# Patient Record
Sex: Male | Born: 1950 | Race: Black or African American | Hispanic: No | Smoking: Never smoker
Health system: Southern US, Community
[De-identification: ages and names within clinical notes are randomized; demographics above are authoritative.]

## PROBLEM LIST (undated history)

## (undated) DIAGNOSIS — I5082 Biventricular heart failure: Secondary | ICD-10-CM

## (undated) DIAGNOSIS — F1990 Other psychoactive substance use, unspecified, uncomplicated: Secondary | ICD-10-CM

## (undated) DIAGNOSIS — F209 Schizophrenia, unspecified: Secondary | ICD-10-CM

## (undated) DIAGNOSIS — I639 Cerebral infarction, unspecified: Secondary | ICD-10-CM

## (undated) HISTORY — PX: APPENDECTOMY: SHX54

---

## 2005-02-24 ENCOUNTER — Emergency Department: Payer: Self-pay | Admitting: Emergency Medicine

## 2005-03-11 ENCOUNTER — Emergency Department: Payer: Self-pay | Admitting: Emergency Medicine

## 2005-07-24 ENCOUNTER — Inpatient Hospital Stay: Payer: Self-pay | Admitting: Unknown Physician Specialty

## 2005-09-25 ENCOUNTER — Emergency Department: Payer: Self-pay | Admitting: Emergency Medicine

## 2005-10-16 ENCOUNTER — Emergency Department: Payer: Self-pay | Admitting: Emergency Medicine

## 2005-11-14 ENCOUNTER — Emergency Department: Payer: Self-pay | Admitting: Unknown Physician Specialty

## 2007-04-21 ENCOUNTER — Emergency Department: Payer: Self-pay

## 2007-06-14 ENCOUNTER — Emergency Department: Payer: Self-pay | Admitting: Emergency Medicine

## 2007-06-27 ENCOUNTER — Emergency Department: Payer: Self-pay | Admitting: Emergency Medicine

## 2007-06-28 ENCOUNTER — Emergency Department: Payer: Self-pay | Admitting: Emergency Medicine

## 2007-07-14 ENCOUNTER — Emergency Department: Payer: Self-pay | Admitting: Emergency Medicine

## 2007-07-16 ENCOUNTER — Emergency Department: Payer: Self-pay | Admitting: Emergency Medicine

## 2007-07-22 ENCOUNTER — Emergency Department: Payer: Self-pay | Admitting: Emergency Medicine

## 2007-07-30 ENCOUNTER — Emergency Department: Payer: Self-pay | Admitting: Emergency Medicine

## 2007-08-03 ENCOUNTER — Emergency Department: Payer: Self-pay | Admitting: Emergency Medicine

## 2007-08-05 ENCOUNTER — Emergency Department: Payer: Self-pay | Admitting: Emergency Medicine

## 2007-08-06 ENCOUNTER — Emergency Department: Payer: Self-pay | Admitting: Emergency Medicine

## 2007-10-14 ENCOUNTER — Emergency Department: Payer: Self-pay | Admitting: Emergency Medicine

## 2007-10-18 ENCOUNTER — Emergency Department: Payer: Self-pay | Admitting: Emergency Medicine

## 2007-10-25 ENCOUNTER — Emergency Department: Payer: Self-pay | Admitting: Internal Medicine

## 2007-11-14 ENCOUNTER — Emergency Department: Payer: Self-pay | Admitting: Emergency Medicine

## 2007-11-18 ENCOUNTER — Emergency Department: Payer: Self-pay | Admitting: Internal Medicine

## 2007-12-05 ENCOUNTER — Emergency Department: Payer: Self-pay | Admitting: Emergency Medicine

## 2007-12-11 ENCOUNTER — Emergency Department: Payer: Self-pay | Admitting: Emergency Medicine

## 2007-12-25 ENCOUNTER — Emergency Department: Payer: Self-pay | Admitting: Emergency Medicine

## 2007-12-28 ENCOUNTER — Emergency Department: Payer: Self-pay | Admitting: Emergency Medicine

## 2008-01-12 ENCOUNTER — Emergency Department: Payer: Self-pay | Admitting: Emergency Medicine

## 2008-01-14 ENCOUNTER — Emergency Department: Payer: Self-pay | Admitting: Internal Medicine

## 2008-02-16 ENCOUNTER — Emergency Department: Payer: Self-pay | Admitting: Emergency Medicine

## 2008-03-13 ENCOUNTER — Emergency Department: Payer: Self-pay | Admitting: Emergency Medicine
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
## 2008-11-30 ENCOUNTER — Emergency Department: Payer: Self-pay | Admitting: Emergency Medicine

## 2009-01-03 ENCOUNTER — Emergency Department: Payer: Self-pay | Admitting: Emergency Medicine

## 2009-02-14 ENCOUNTER — Emergency Department: Payer: Self-pay | Admitting: Emergency Medicine

## 2009-03-27 ENCOUNTER — Emergency Department: Payer: Self-pay | Admitting: Unknown Physician Specialty

## 2009-04-02 ENCOUNTER — Emergency Department: Payer: Self-pay | Admitting: Unknown Physician Specialty

## 2010-11-27 ENCOUNTER — Emergency Department: Payer: Self-pay | Admitting: Emergency Medicine

## 2011-01-03 ENCOUNTER — Emergency Department: Payer: Self-pay | Admitting: Emergency Medicine

## 2011-07-04 ENCOUNTER — Emergency Department: Payer: Self-pay | Admitting: Emergency Medicine

## 2011-07-25 ENCOUNTER — Emergency Department: Payer: Self-pay

## 2011-10-31 ENCOUNTER — Emergency Department: Payer: Self-pay | Admitting: Emergency Medicine

## 2011-10-31 LAB — ETHANOL: Ethanol %: <0.003 % (ref 0.000–0.080)

## 2011-10-31 LAB — COMPREHENSIVE METABOLIC PANEL
Albumin: 3.8 g/dL (ref 3.4–5.0)
Anion Gap: 9 (ref 7–16)
BUN: 17 mg/dL (ref 7–18)
Chloride: 105 mmol/L (ref 98–107)
EGFR (Non-African Amer.): >60
Glucose: 96 mg/dL (ref 65–99)
Osmolality: 277 (ref 275–301)
Potassium: 3.9 mmol/L (ref 3.5–5.1)
SGPT (ALT): 23 U/L
Sodium: 138 mmol/L (ref 136–145)
Total Protein: 7.3 g/dL (ref 6.4–8.2)

## 2011-10-31 LAB — URINALYSIS, COMPLETE
Blood: NEGATIVE
Glucose,UR: NEGATIVE mg/dL (ref 0–75)
Ketone: NEGATIVE
Nitrite: NEGATIVE
Ph: 6 (ref 4.5–8.0)
Protein: NEGATIVE
Specific Gravity: 1.016 (ref 1.003–1.030)

## 2011-10-31 LAB — DRUG SCREEN, URINE
Amphetamines, Ur Screen: NEGATIVE (ref ?–1000)
Barbiturates, Ur Screen: NEGATIVE (ref ?–200)
Benzodiazepine, Ur Scrn: NEGATIVE (ref ?–200)
Cannabinoid 50 Ng, Ur ~~LOC~~: NEGATIVE (ref ?–50)
Cocaine Metabolite,Ur ~~LOC~~: NEGATIVE (ref ?–300)
Methadone, Ur Screen: NEGATIVE (ref ?–300)
Opiate, Ur Screen: NEGATIVE (ref ?–300)

## 2011-10-31 LAB — TSH: Thyroid Stimulating Horm: 1.82 u[IU]/mL

## 2011-11-02 LAB — CBC
HGB: 13.5 g/dL (ref 13.0–18.0)
MCH: 30.5 pg (ref 26.0–34.0)
MCHC: 31.7 g/dL — ABNORMAL LOW (ref 32.0–36.0)
Platelet: 318 10*3/uL (ref 150–440)
RBC: 4.44 10*6/uL (ref 4.40–5.90)
WBC: 4.6 10*3/uL (ref 3.8–10.6)

## 2012-02-18 ENCOUNTER — Emergency Department: Payer: Self-pay | Admitting: Emergency Medicine

## 2012-02-18 LAB — COMPREHENSIVE METABOLIC PANEL
Anion Gap: 8 (ref 7–16)
BUN: 11 mg/dL (ref 7–18)
Calcium, Total: 8.7 mg/dL (ref 8.5–10.1)
Chloride: 104 mmol/L (ref 98–107)
Co2: 30 mmol/L (ref 21–32)
Creatinine: 0.93 mg/dL (ref 0.60–1.30)
EGFR (African American): >60
Potassium: 3.6 mmol/L (ref 3.5–5.1)
SGPT (ALT): 22 U/L
Total Protein: 7.7 g/dL (ref 6.4–8.2)

## 2012-02-18 LAB — ACETAMINOPHEN LEVEL: Acetaminophen: <2 ug/mL

## 2012-02-18 LAB — CBC
HGB: 14.2 g/dL (ref 13.0–18.0)
MCH: 32.9 pg (ref 26.0–34.0)
MCV: 98 fL (ref 80–100)
Platelet: 381 10*3/uL (ref 150–440)
RDW: 12.6 % (ref 11.5–14.5)

## 2012-02-18 LAB — DRUG SCREEN, URINE
Amphetamines, Ur Screen: NEGATIVE (ref ?–1000)
Benzodiazepine, Ur Scrn: NEGATIVE (ref ?–200)
Cocaine Metabolite,Ur ~~LOC~~: POSITIVE (ref ?–300)
MDMA (Ecstasy)Ur Screen: NEGATIVE (ref ?–500)
Methadone, Ur Screen: NEGATIVE (ref ?–300)
Opiate, Ur Screen: NEGATIVE (ref ?–300)
Tricyclic, Ur Screen: NEGATIVE (ref ?–1000)

## 2012-02-18 LAB — TSH: Thyroid Stimulating Horm: 0.99 u[IU]/mL

## 2012-02-18 LAB — ETHANOL
Ethanol %: <0.003 % (ref 0.000–0.080)
Ethanol: <3 mg/dL

## 2012-02-22 LAB — VALPROIC ACID LEVEL: Valproic Acid: 70 ug/mL

## 2012-09-15 ENCOUNTER — Emergency Department: Payer: Self-pay | Admitting: Unknown Physician Specialty

## 2012-09-15 LAB — COMPREHENSIVE METABOLIC PANEL
Alkaline Phosphatase: 75 U/L (ref 50–136)
Bilirubin,Total: 0.5 mg/dL (ref 0.2–1.0)
Chloride: 107 mmol/L (ref 98–107)
Co2: 25 mmol/L (ref 21–32)
Creatinine: 1.05 mg/dL (ref 0.60–1.30)
EGFR (Non-African Amer.): >60
Osmolality: 294 (ref 275–301)
SGPT (ALT): 39 U/L (ref 12–78)
Sodium: 146 mmol/L — ABNORMAL HIGH (ref 136–145)
Total Protein: 8.2 g/dL (ref 6.4–8.2)

## 2012-09-15 LAB — DRUG SCREEN, URINE
Amphetamines, Ur Screen: NEGATIVE (ref ?–1000)
Benzodiazepine, Ur Scrn: NEGATIVE (ref ?–200)
Cocaine Metabolite,Ur ~~LOC~~: POSITIVE (ref ?–300)
Methadone, Ur Screen: NEGATIVE (ref ?–300)
Opiate, Ur Screen: NEGATIVE (ref ?–300)
Tricyclic, Ur Screen: NEGATIVE (ref ?–1000)

## 2012-09-15 LAB — CBC
HCT: 42.5 % (ref 40.0–52.0)
HGB: 13.8 g/dL (ref 13.0–18.0)
MCH: 32.6 pg (ref 26.0–34.0)
MCHC: 32.5 g/dL (ref 32.0–36.0)
MCV: 100 fL (ref 80–100)
RBC: 4.24 10*6/uL — ABNORMAL LOW (ref 4.40–5.90)
RDW: 13.5 % (ref 11.5–14.5)
WBC: 9.5 10*3/uL (ref 3.8–10.6)

## 2012-09-15 LAB — ETHANOL
Ethanol %: <0.003 % (ref 0.000–0.080)
Ethanol: <3 mg/dL

## 2012-09-15 LAB — URINALYSIS, COMPLETE
Blood: NEGATIVE
Glucose,UR: NEGATIVE mg/dL (ref 0–75)
Leukocyte Esterase: NEGATIVE
Nitrite: NEGATIVE
Protein: NEGATIVE
Specific Gravity: 1.019 (ref 1.003–1.030)
Squamous Epithelial: NONE SEEN

## 2012-09-15 LAB — TSH: Thyroid Stimulating Horm: 1.29 u[IU]/mL

## 2012-09-28 ENCOUNTER — Emergency Department: Payer: Self-pay | Admitting: Emergency Medicine

## 2013-06-07 ENCOUNTER — Emergency Department: Payer: Self-pay | Admitting: Emergency Medicine

## 2013-06-15 ENCOUNTER — Emergency Department: Payer: Self-pay | Admitting: Emergency Medicine

## 2014-01-11 ENCOUNTER — Emergency Department: Payer: Self-pay | Admitting: Emergency Medicine

## 2014-01-11 LAB — RAPID HIV-1/2 QL/CONFIRM: HIV-1/2,Rapid Ql: NEGATIVE

## 2014-07-06 ENCOUNTER — Emergency Department: Payer: Self-pay | Admitting: Emergency Medicine

## 2014-12-17 NOTE — Consult Note (Signed)
Brief Consult Note: Diagnosis: Schizophrenia Ch Paranoid Type with acute Exacerbation, Cocaine Abuse.   Patient was seen by consultant.   Consult note dictated.   Orders entered.   Comments: Restart Abilify 10 mg po qam.  Depakote ER 500mg  po BID.  Pt will be monitored for safety as he is currently IVC.  Electronic Signatures: Rhunette CroftFaheem, Aryeh Butterfield S (MD)  (Signed 21-Jan-14 10:56)  Authored: Brief Consult Note   Last Updated: 21-Jan-14 10:56 by Rhunette CroftFaheem, Tameem Pullara S (MD)
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
## 2014-12-19 NOTE — Consult Note (Signed)
Brief Consult Note: Diagnosis: Schizoaffective disorder.   Patient was seen by consultant.   Recommend further assessment or treatment.   Comments: [name] has a h/o schizoaffective disorder. He came to the ER asking for medications but instead he was placed on IVC for disorganized thoughts and paranoid delusions. He was started on Abilify by Dr. Jeanie [name] and is at his baseline now.   PLAN: 1. The patient no longer meets criteria for IVC. I will terminate proceedings. Please discharge as appropriate.   2. He is to contionue Abilify 20 mg daily. Rx was provided.  3. He will follow up with [facility] on 4/3 at 1:00 with Miguel [name].  4. He will move in with his brother.  5. He will be picked up from ER by [name] from [facility].  Electronic Signatures: Miguel [name], Miguel [name] (MD)  (Signed 11-Mar-13 16:01)  Authored: Brief Consult Note   Last Updated: 11-Mar-13 16:01 by Miguel [name], Miguel [name] (MD)
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
## 2014-12-19 NOTE — Consult Note (Signed)
Brief Consult Note: Diagnosis: Schizoaffective disorder bipolar type, cocaine abuse.   Patient was seen by consultant.   Consult note dictated.   Recommend further assessment or treatment.   Orders entered.   Discussed with Attending MD.   Comments: [name] has a long h/o psychosis and mood instability. He has not been compliant with medications since he was released from jail in the spring. He was brought to the hospital floridly psychotic and agitataed in the context of relapse on cocaine.   He has been accepting medications and tolerates them well. VPA level 70. There are no behavioral problems. He continues to improve. Paranoid delusions are resolving, he shows some insight into his problems and is  no longer hostile towards his minister brother.    MSE: Alert, oriented, pleasant,no SI/HI, no behavioral problems.  PLAN: 1. The patient no longer meets criteria for IVC. I will terminate proceedings. Please discharge as appropriate.   2. He will continue Abilify 10 gm daily and Depakote 750 mg twice daily for psychosis and mood stabilization. Rx provided. The family was given 3 weeks supply of Abilify samples, free of charge, as his Medicaid is not yet active.   3. The patient is discharged into his brother's care.    4. His community support team will transport the patient to his brother's place. They will also work with [name] to have his disability check reinstated. We completed Medicaid application here already.   5. He will follow up with Dr. Suzie PortelaMoffitt. He knows to go to [facility] for bridging appinytment..  Electronic Signatures: Kristine LineaPucilowska, Jolanta (MD)  (Signed 28-Jun-13 14:02)  Authored: Brief Consult Note   Last Updated: 28-Jun-13 14:02 by Kristine LineaPucilowska, Jolanta (MD)

## 2014-12-19 NOTE — Consult Note (Signed)
Brief Consult Note: Diagnosis: Schizoaffective disorder bipolar type, cocaine abuse.   Patient was seen by consultant.   Consult note dictated.   Recommend further assessment or treatment.   Orders entered.   Discussed with Attending MD.   Comments: [name] has a long h/o psychosis and mood instability. He has not been compliant with medications since he was released from jail in the spring. He was brought to the hospital floridly psychotic and agitataed in the context of relapse on cocaine.   He has been accepting medications and tolerates them well. There are no behavioral problems. he already shows improvement and was able to complete Medicaid application today. he lost Medicaid in April 2013.   MSE: Alert, oriented, pleasant,no SI/HI.  PLAN: 1. We will continue Abilify 10 gm and Depakote 500 mg tid. VPA level check in 1-2 days.  2. The patient has a h/o stalking his psychiatrist during previous admission here therefore he may not be appropriate for admission here.   3. We will discharge in the care of his brother when stable.   4. I will follow up.  Electronic Signatures: Kristine LineaPucilowska, Arianna Haydon (MD)  (Signed 27-Jun-13 00:30)  Authored: Brief Consult Note   Last Updated: 27-Jun-13 00:30 by Kristine LineaPucilowska, Lanique Gonzalo (MD)

## 2014-12-19 NOTE — Consult Note (Signed)
Brief Consult Note: Diagnosis: Schizoaffective disorder bipolar type, cocaine abuse.   Patient was seen by consultant.   Consult note dictated.   Recommend further assessment or treatment.   Orders entered.   Discussed with Attending MD.   Comments: [name] has a long h/o psychosis and mood instability. He has not been compliant with medications since he was released from jail in the spring. He was brought to the hospital floridly psychotic and agitataed in the context of relapse on cocaine.   PLAN: 1. We will restart Abilify 10 gm and Depakote 500 mg tid.   2. The patient has a h/o stalking his psychiatrist during previous admission here therefore he may not be appropriate for admission here.   3. I will follow up.  Electronic Signatures: Kristine LineaPucilowska, Cabrina Shiroma (MD)  (Signed 25-Jun-13 13:18)  Authored: Brief Consult Note   Last Updated: 25-Jun-13 13:18 by Kristine LineaPucilowska, Gloris Shiroma (MD)

## 2014-12-19 NOTE — Consult Note (Signed)
Pt is pending [facility]  Full dictation and orders to follow.   Electronic Signatures: Lester CarolinaWilliford, Skylarr Liz S (MD) (Signed on 06-Mar-13 15:10)  Authored   Last Updated: 06-Mar-13 23:50 by Lester CarolinaWilliford, Atreyu Mak S (MD)

## 2014-12-19 NOTE — Consult Note (Signed)
Brief Consult Note: Diagnosis: Schizoaffective disorder bipolar type, cocaine abuse.   Patient was seen by consultant.   Consult note dictated.   Recommend further assessment or treatment.   Orders entered.   Discussed with Attending MD.   Comments: [name] has a long h/o psychosis and mood instability. He has not been compliant with medications since he was released from jail in the spring. He was brought to the hospital floridly psychotic and agitataed in the context of relapse on cocaine.   He has been accepting medications and tolerates them well. There are no behavioral problems. He continues to improve. Paranoid delusions are resolving, he shows some insight into his problems and is less hostile towards his brother.    MSE: Alert, oriented, pleasant,no SI/HI.  PLAN: 1. We will continue Abilify 10 gm and Depakote 500 mg tid. VPA level in am.   2. The patient has a h/o stalking his psychiatrist during previous admission here therefore he may not be appropriate for admission here.   3. We will discharge in the care of his brother when stable.   4. I will follow up.  Electronic Signatures: Kristine LineaPucilowska, Marquett Bertoli (MD)  (Signed 28-Jun-13 00:02)  Authored: Brief Consult Note   Last Updated: 28-Jun-13 00:02 by Kristine LineaPucilowska, Meya Clutter (MD)
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
## 2015-05-27 ENCOUNTER — Encounter: Payer: Self-pay | Admitting: *Deleted

## 2015-05-27 ENCOUNTER — Emergency Department
Admission: EM | Admit: 2015-05-27 | Discharge: 2015-05-27 | Discharge status: Against Medical Advice | Payer: Medicaid Other | Attending: Emergency Medicine | Admitting: Emergency Medicine

## 2015-05-27 ENCOUNTER — Emergency Department: Payer: Medicaid Other

## 2015-05-27 DIAGNOSIS — M25511 Pain in right shoulder: Secondary | ICD-10-CM | POA: Insufficient documentation

## 2015-05-27 DIAGNOSIS — I214 Non-ST elevation (NSTEMI) myocardial infarction: Secondary | ICD-10-CM | POA: Insufficient documentation

## 2015-05-27 DIAGNOSIS — R079 Chest pain, unspecified: Secondary | ICD-10-CM | POA: Diagnosis present

## 2015-05-27 HISTORY — DX: Schizophrenia, unspecified: F20.9

## 2015-05-27 LAB — CBC
HCT: 42.2 % (ref 40.0–52.0)
Hemoglobin: 14 g/dL (ref 13.0–18.0)
MCH: 31.9 pg (ref 26.0–34.0)
MCHC: 33.3 g/dL (ref 32.0–36.0)
MCV: 96 fL (ref 80.0–100.0)
PLATELETS: 462 10*3/uL — AB (ref 150–440)
RBC: 4.4 MIL/uL (ref 4.40–5.90)
RDW: 13.4 % (ref 11.5–14.5)
WBC: 6 10*3/uL (ref 3.8–10.6)

## 2015-05-27 LAB — BASIC METABOLIC PANEL
Anion gap: 6 (ref 5–15)
BUN: 17 mg/dL (ref 6–20)
CALCIUM: 9.2 mg/dL (ref 8.9–10.3)
CO2: 30 mmol/L (ref 22–32)
CREATININE: 1.12 mg/dL (ref 0.61–1.24)
Chloride: 105 mmol/L (ref 101–111)
GFR calc non Af Amer: >60 mL/min (ref >60)
Glucose, Bld: 88 mg/dL (ref 65–99)
Potassium: 4.1 mmol/L (ref 3.5–5.1)
SODIUM: 141 mmol/L (ref 135–145)

## 2015-05-27 LAB — TROPONIN I: Troponin I: 0.11 ng/mL — ABNORMAL HIGH (ref <0.031)

## 2015-05-27 MED ORDER — ASPIRIN 81 MG PO CHEW
324.0000 mg | CHEWABLE_TABLET | Freq: Once | ORAL | Status: AC
  Administered 2015-05-27: 324 mg via ORAL
  Filled 2015-05-27: qty 4

## 2015-05-27 NOTE — ED Notes (Signed)
Pt stated he refused further assessments and discharge vital signs. Pt states he is aware of of the health risks in leaving.; discussed with MD. Charge nurse aware.

## 2015-05-27 NOTE — ED Provider Notes (Signed)
[facility] Emergency Department Provider Note  ____________________________________________  Time seen: Approximately 320 PM  I have reviewed the triage vital signs and the nursing notes.   HISTORY  Chief Complaint Arm Pain and Generalized Body Aches    HPI Miguel [name] is a 64 y.o. male with a history of schizophreniaand a positive family history for cardiac disease who is presenting today with pressure to his right shoulder as well as shooting chest pain to his left arm. He says that is been worsening over the past 3 days and is associated with shortness of breath. It occurs at rest as well as with exertion. He denies any chest pain or shortness of breath at this time. Was sent over from the [facility] health center this morning for concern for cardiac cause of his chest pain. He has a history of MI and his family including death with first 3 family members in their 19s. The patient also has a history of cocaine use and says he has not used in the year. Also quit smoking 9 months ago.   Past Medical History  Diagnosis Date  . Schizophrenia     There are no active problems to display for this patient.   Past Surgical History  Procedure Laterality Date  . Appendectomy      No current outpatient prescriptions on file.  Allergies Review of patient's allergies indicates no known allergies.  No family history on file.  Social History Social History  Substance Use Topics  . Smoking status: Never Smoker   . Smokeless tobacco: None  . Alcohol Use: No    Review of Systems Constitutional: No fever/chills Eyes: No visual changes. ENT: No sore throat. Cardiovascular: As above Respiratory: As above  Gastrointestinal: No abdominal pain.  No nausea, no vomiting.  No diarrhea.  No constipation. Genitourinary: Negative for dysuria. Musculoskeletal: Negative for back pain. Skin: Negative for rash. Neurological: Negative for headaches, focal  weakness or numbness.  10-point ROS otherwise negative.  ____________________________________________   PHYSICAL EXAM:  VITAL SIGNS: ED Triage Vitals  Enc Vitals Group     BP 05/27/15 1343 153/71 mmHg     Pulse Rate 05/27/15 1343 55     Resp 05/27/15 1343 16     Temp 05/27/15 1343 98 F (36.7 C)     Temp Source 05/27/15 1343 Oral     SpO2 05/27/15 1343 98 %     Weight 05/27/15 1343 195 lb (88.451 kg)     Height 05/27/15 1343 6' (1.829 m)     Head Cir --      Peak Flow --      Pain Score --      Pain Loc --      Pain Edu? --      Excl. in GC? --     Constitutional: Alert and oriented. Well appearing and in no acute distress. Eyes: Conjunctivae are normal. PERRL. EOMI. Head: Atraumatic. Nose: No congestion/rhinnorhea. Mouth/Throat: Mucous membranes are moist.  Oropharynx non-erythematous. Neck: No stridor.   Cardiovascular: Normal rate, regular rhythm. Grossly normal heart sounds.  Good peripheral circulation. Respiratory: Normal respiratory effort.  No retractions. Lungs CTAB. Gastrointestinal: Soft and nontender. No distention. No abdominal bruits. No CVA tenderness. Musculoskeletal: No lower extremity tenderness nor edema.  No joint effusions. Neurologic:  Normal speech and language. No gross focal neurologic deficits are appreciated. No gait instability. Skin:  Skin is warm, dry and intact. No rash noted. Psychiatric: Mood and affect are normal. Speech  and behavior are normal.  ____________________________________________   LABS (all labs ordered are listed, but only abnormal results are displayed)  Labs Reviewed  CBC - Abnormal; Notable for the following:    Platelets 462 (*)    All other components within normal limits  TROPONIN I - Abnormal; Notable for the following:    Troponin I 0.11 (*)    All other components within normal limits  BASIC METABOLIC PANEL   ____________________________________________  EKG  ED ECG REPORT I, Schaevitz,  Teena Irani, the  attending physician, personally viewed and interpreted this ECG.   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  ____________________________________________  RADIOLOGY  No active cardiopulmonary disease. I personally reviewed this film. ____________________________________________   PROCEDURES   ____________________________________________   INITIAL IMPRESSION / ASSESSMENT AND PLAN / ED COURSE  Pertinent labs & imaging results that were available during my care of the patient were reviewed by me and considered in my medical decision making (see chart for details).  ----------------------------------------- 3:39 PM on 05/27/2015 -----------------------------------------  I discussed with the patient the need to stay at the hospital for admission because I believe that he is having an acute coronary syndrome. I told him that he has a very abnormal EKG and has lab work is also concerning for heart attack. Despite this, the patient says that he must go home to lock up and "take care of things" for an hour. He says that he will be right back and that he understands the risks of leaving AGAINST MEDICAL ADVICE including death, permanent disability as well as multiple other debilitating conditions. He says that he has had a lot of issues with the law and has had a long time and his lifespan in prison. He says that he cannot stay at this time because he needs to do some things for his job and calm back to the hospital within an hour. Despite the patient having a psychiatric history, he says that he is compliant with his medications. He has decisional capacity at this time. He is not clinically intoxicated. He has insight into the illness of a heart attack and understands that it could result in permanent disability and/or death. I  asked if it would be okay for me to discuss his decision with a family member and he told me that he would not like me to call his family. I also offered to call his job to tell them that he will be needing treatment in the hospital for a serious illness. He did not 1 me to discuss this with his employer as well. The patient did sign an AGAINST MEDICAL ADVICE form in my presence. He did agree to take aspirin before leaving and continues to be a symptomatically at this time. ____________________________________________   FINAL CLINICAL IMPRESSION(S) / ED DIAGNOSES  Non-STEMI. Chest pain. Acute, Initial visit.    Myrna Blazer, MD 05/27/15 [phone]

## 2015-05-27 NOTE — Discharge Instructions (Signed)
Acute Coronary Syndrome  Acute coronary syndrome (ACS) is an urgent problem in which the blood and oxygen supply to the heart is critically deficient. ACS requires hospitalization because one or more coronary arteries may be blocked.  ACS represents a range of conditions including:  · Previous angina that is now unstable, lasts longer, happens at rest, or is more intense.  · A heart attack, with heart muscle cell injury and death.  There are three vital coronary arteries that supply the heart muscle with blood and oxygen so that it can pump blood effectively. If blockages to these arteries develop, blood flow to the heart muscle is reduced. If the heart does not get enough blood, angina may occur as the first warning sign.  SYMPTOMS   · The most common signs of angina include:  ¨ Tightness or squeezing in the chest.  ¨ Feeling of heaviness on the chest.  ¨ Discomfort in the arms, neck, back, or jaw.  ¨ Shortness of breath and nausea.  ¨ Cold, wet skin.  · Angina is usually brought on by physical effort or excitement which increase the oxygen needs of the heart. These states increase the blood flow needs of the heart beyond what can be delivered.  · Other symptoms that are not as common include:  ¨ Fatigue  ¨ Unexplained feelings of nervousness or anxiety  ¨ Weakness  ¨ Diarrhea  · Sometimes, you may not have noticed any symptoms at all but still suffered a cardiac injury.  TREATMENT   · Medicines to help discomfort may include nitroglycerin (nitro) in the form of tablets or a spray for rapid relief, or longer-acting forms such as cream, patches, or capsules. (Be aware that there are many side effects and possible interactions with other drugs).  · Other medicines may be used to help the heart pump better.  · Procedures to open blocked arteries including angioplasty or stent placement to keep the arteries open.  · Open heart surgery may be needed when there are many blockages or they are in critical locations that  are best treated with surgery.  HOME CARE INSTRUCTIONS   · Do not use any tobacco products including cigarettes, chewing tobacco, or electronic cigarettes.  · Take one baby or adult aspirin daily, if your health care provider advises. This helps reduce the risk of a heart attack.  · It is very important that you follow the angina treatment prescribed by your health care provider. Make arrangements for proper follow-up care.  · Eat a heart healthy diet with salt and fat restrictions as advised.  · Regular exercise is good for you as long as it does not cause discomfort. Do not begin any new type of exercise until you check with your health care provider.  · If you are overweight, you should lose weight.  · Try to maintain normal blood lipid levels.  · Keep your blood pressure under control as recommended by your health care provider.  · You should tell your health care provider right away about any increase in the severity or frequency of your chest discomfort or angina attacks. When you have angina, you should stop what you are doing and sit down. This may bring relief in 3 to 5 minutes. If your health care provider has prescribed nitro, take it as directed.  · If your health care provider has given you a follow-up appointment, it is very important to keep that appointment. Not keeping the appointment could result in a chronic or   permanent injury, pain, and disability. If there is any problem keeping the appointment, you must call back to this facility for assistance.  SEEK IMMEDIATE MEDICAL CARE IF:   · You develop nausea, vomiting, or shortness of breath.  · You feel faint, lightheaded, or pass out.  · Your chest discomfort gets worse.  · You are sweating or experience sudden profound fatigue.  · You do not get relief of your chest pain after 3 doses of nitro.  · Your discomfort lasts longer than 15 minutes.  MAKE SURE YOU:   · Understand these instructions.  · Will watch your condition.  · Will get help right  away if you are not doing well or get worse.  · Take all medicines as directed by your health care provider.  Document Released: 08/13/2005 Document Revised: 08/18/2013 Document Reviewed: 12/15/2013  Exit[employer]® Patient Information ©2015 [employer]. This information is not intended to replace advice given to you by your health care provider. Make sure you discuss any questions you have with your health care provider.

## 2015-05-27 NOTE — ED Notes (Addendum)
Pt reports seen at [facility] and sent here. Reports numbness to left forearm, right shoulder pain. At times radiates into chest. Also c/o abdominal bloating/pressure. Intermittent shortness of breath. Pain x 2 days. States comes while sitting and with exertion.

## 2016-04-11 ENCOUNTER — Emergency Department
Admission: EM | Admit: 2016-04-11 | Discharge: 2016-04-11 | Disposition: A | Discharge status: Home / Self Care | Payer: Medicaid Other | Attending: Student | Admitting: Student

## 2016-04-11 DIAGNOSIS — F209 Schizophrenia, unspecified: Secondary | ICD-10-CM

## 2016-04-11 DIAGNOSIS — F200 Paranoid schizophrenia: Secondary | ICD-10-CM

## 2016-04-11 DIAGNOSIS — F918 Other conduct disorders: Secondary | ICD-10-CM | POA: Diagnosis present

## 2016-04-11 DIAGNOSIS — Z9119 Patient's noncompliance with other medical treatment and regimen: Secondary | ICD-10-CM

## 2016-04-11 DIAGNOSIS — Z5181 Encounter for therapeutic drug level monitoring: Secondary | ICD-10-CM | POA: Insufficient documentation

## 2016-04-11 DIAGNOSIS — Z91199 Patient's noncompliance with other medical treatment and regimen due to unspecified reason: Secondary | ICD-10-CM

## 2016-04-11 LAB — CBC WITH DIFFERENTIAL/PLATELET
Basophils Absolute: 0.1 10*3/uL (ref 0–0.1)
Basophils Relative: 1 %
Eosinophils Absolute: 0.2 10*3/uL (ref 0–0.7)
Eosinophils Relative: 3 %
HEMATOCRIT: 40.7 % (ref 40.0–52.0)
HEMOGLOBIN: 13.9 g/dL (ref 13.0–18.0)
LYMPHS ABS: 1.7 10*3/uL (ref 1.0–3.6)
LYMPHS PCT: 30 %
MCH: 32.8 pg (ref 26.0–34.0)
MCHC: 34.1 g/dL (ref 32.0–36.0)
MCV: 96.2 fL (ref 80.0–100.0)
MONOS PCT: 10 %
Monocytes Absolute: 0.6 10*3/uL (ref 0.2–1.0)
NEUTROS ABS: 3.3 10*3/uL (ref 1.4–6.5)
NEUTROS PCT: 56 %
Platelets: 367 10*3/uL (ref 150–440)
RBC: 4.23 MIL/uL — ABNORMAL LOW (ref 4.40–5.90)
RDW: 13.7 % (ref 11.5–14.5)
WBC: 5.8 10*3/uL (ref 3.8–10.6)

## 2016-04-11 LAB — URINE DRUG SCREEN, QUALITATIVE (ARMC ONLY)
Amphetamines, Ur Screen: NOT DETECTED
Barbiturates, Ur Screen: NOT DETECTED
Benzodiazepine, Ur Scrn: NOT DETECTED
CANNABINOID 50 NG, UR ~~LOC~~: NOT DETECTED
Cocaine Metabolite,Ur ~~LOC~~: POSITIVE — AB
MDMA (ECSTASY) UR SCREEN: NOT DETECTED
Methadone Scn, Ur: NOT DETECTED
OPIATE, UR SCREEN: NOT DETECTED
PHENCYCLIDINE (PCP) UR S: NOT DETECTED
Tricyclic, Ur Screen: NOT DETECTED

## 2016-04-11 LAB — SALICYLATE LEVEL

## 2016-04-11 LAB — ETHANOL: Alcohol, Ethyl (B): <5 mg/dL (ref <5)

## 2016-04-11 LAB — COMPREHENSIVE METABOLIC PANEL
ALK PHOS: 66 U/L (ref 38–126)
ALT: 18 U/L (ref 17–63)
ANION GAP: 10 (ref 5–15)
AST: 25 U/L (ref 15–41)
Albumin: 4.2 g/dL (ref 3.5–5.0)
BILIRUBIN TOTAL: 0.7 mg/dL (ref 0.3–1.2)
BUN: 21 mg/dL — ABNORMAL HIGH (ref 6–20)
CO2: 23 mmol/L (ref 22–32)
CREATININE: 1.17 mg/dL (ref 0.61–1.24)
Calcium: 8.9 mg/dL (ref 8.9–10.3)
Chloride: 107 mmol/L (ref 101–111)
GFR calc non Af Amer: >60 mL/min (ref >60)
GLUCOSE: 107 mg/dL — AB (ref 65–99)
Potassium: 3.8 mmol/L (ref 3.5–5.1)
Sodium: 140 mmol/L (ref 135–145)
TOTAL PROTEIN: 7.2 g/dL (ref 6.5–8.1)

## 2016-04-11 LAB — ACETAMINOPHEN LEVEL

## 2016-04-11 MED ORDER — ARIPIPRAZOLE ER 400 MG IM SUSR
400.0000 mg | Freq: Once | INTRAMUSCULAR | Status: AC
  Administered 2016-04-11: 400 mg via INTRAMUSCULAR
  Filled 2016-04-11: qty 400

## 2016-04-11 NOTE — Consult Note (Signed)
[facility] Consult   Reason for Consult:  Consult for 65 year old man with a history of schizophrenia brought to the hospital under involuntary commitment paperwork filed by the Department of social services Referring Physician:  Cinda Quest Patient Identification: Miguel [name] MRN:  [id number] Principal Diagnosis: Schizophrenia [facility]) Diagnosis:   Patient Active Problem List   Diagnosis Date Noted  . Schizophrenia ([place]) [F20.9] 04/11/2016  . Noncompliance [Z91.19] 04/11/2016    Total Time spent with patient: 1 hour  Subjective:   Miguel [name] is a 65 y.o. male patient admitted with "they just picked me up".  HPI:  Patient seen chart reviewed. Labs and vitals reviewed. This 65 year old man was brought here under involuntary commitment paperwork filed by the Department of Social Services. They described him as acting bizarrely in public and being noncompliant with his medicine and appearing to have a thought disorder. Patient says that he had gone to social services to try and get his food stamps and other benefits that he could possibly be entitled to. He thinks that they started to get upset when he was started to saying. Patient does say that he works at a [employer]. His story is rambling and a little hard to follow and not very believable. He tells me that he stays in a couple different homes. He doesn't look particularly dirty and I suspect he has been taking care of himself fairly adequately. They are alleging that he doesn't eat but he doesn't look to be that thin or on healthy. Patient admits that he has not been on his psychiatric medicines recently. He says he was supposed to get his Abilify shot about 10 or 11 days ago and didn't do it and hasn't been taking his medicine. Patient denies any suicidal or homicidal thoughts.  Social history: Sounds like he may live in a variety of places that he finds to stay. Doesn't sound like he stays in very close contact  with any family.  Medical history: Seems to have fairly good health all things considered not on any medicines besides the psychiatric but I can tell.  Substance abuse history: He says he does not use alcohol very much drinking only a little bit beer here and there. Same thing with marijuana. Downplays the degree to which it could be a problem. He apparently got arrested for possession of crack cocaine many years ago which has caused him long-standing Roblin's with the legality of his benefits.  Past Psychiatric History: Long-standing diagnosis of schizophrenia. We have seen him many times here at the hospital. He does have a history of psychiatric hospitalization. In the past he was being treated with a combination of Depakote and Abilify. He says more recently he has just been on the Abilify shot. Denies any history of suicidal or homicidal behavior.  Risk to Self: Suicidal Ideation: No Suicidal Intent: No Is patient at risk for suicide?: No Suicidal Plan?: No Access to Means: No What has been your use of drugs/alcohol within the last 12 months?: None, Pt reported a history  How many times?: 0 Other Self Harm Risks: 0 Triggers for Past Attempts: Other (Comment) (N/A) Intentional Self Injurious Behavior: None Risk to Others: Homicidal Ideation: No Thoughts of Harm to Others: No Current Homicidal Intent: No Current Homicidal Plan: No Access to Homicidal Means: No Identified Victim: N/A Does patient have access to weapons?: No Criminal Charges Pending?: No Does patient have a court date: No Prior Inpatient Therapy: Prior Inpatient Therapy: Yes Prior Therapy Dates:  01/2012,08/2012 Prior Therapy Facilty/Provider(s): [facility] Reason for Treatment: Paranoia  Prior Outpatient Therapy: Prior Outpatient Therapy: Yes Prior Therapy Dates: current  Prior Therapy Facilty/Provider(s): [facility] Reason for Treatment: Schizoaffective disorder bipolar type Does patient have an ACCT team?: Yes Does patient  have Intensive In-House Services?  : No Does patient have [facility] services? : No Does patient have P4CC services?: No  Past Medical History:  Past Medical History:  Diagnosis Date  . Schizophrenia [facility])     Past Surgical History:  Procedure Laterality Date  . APPENDECTOMY     Family History: No family history on file. Family Psychiatric  History: No known family history Social History:  History  Alcohol Use No     History  Drug Use No    Social History   Social History  . Marital status: Divorced    Spouse name: N/A  . Number of children: N/A  . Years of education: N/A   Social History Main Topics  . Smoking status: Never Smoker  . Smokeless tobacco: Never Used  . Alcohol use No  . Drug use: No  . Sexual activity: Not Asked   Other Topics Concern  . None   Social History Narrative  . None   Additional Social History:    Allergies:  No Known Allergies  Labs:  Results for orders placed or performed during the hospital encounter of 04/11/16 (from the past 48 hour(s))  CBC with Differential     Status: Abnormal   Collection Time: 04/11/16 10:07 AM  Result Value Ref Range   WBC 5.8 3.8 - 10.6 K/uL   RBC 4.23 (L) 4.40 - 5.90 MIL/uL   Hemoglobin 13.9 13.0 - 18.0 g/dL   HCT 40.7 40.0 - 52.0 %   MCV 96.2 80.0 - 100.0 fL   MCH 32.8 26.0 - 34.0 pg   MCHC 34.1 32.0 - 36.0 g/dL   RDW 13.7 11.5 - 14.5 %   Platelets 367 150 - 440 K/uL   Neutrophils Relative % 56 %   Neutro Abs 3.3 1.4 - 6.5 K/uL   Lymphocytes Relative 30 %   Lymphs Abs 1.7 1.0 - 3.6 K/uL   Monocytes Relative 10 %   Monocytes Absolute 0.6 0.2 - 1.0 K/uL   Eosinophils Relative 3 %   Eosinophils Absolute 0.2 0 - 0.7 K/uL   Basophils Relative 1 %   Basophils Absolute 0.1 0 - 0.1 K/uL  Comprehensive metabolic panel     Status: Abnormal   Collection Time: 04/11/16 10:07 AM  Result Value Ref Range   Sodium 140 135 - 145 mmol/L   Potassium 3.8 3.5 - 5.1 mmol/L   Chloride 107 101 - 111 mmol/L    CO2 23 22 - 32 mmol/L   Glucose, Bld 107 (H) 65 - 99 mg/dL   BUN 21 (H) 6 - 20 mg/dL   Creatinine, Ser 1.17 0.61 - 1.24 mg/dL   Calcium 8.9 8.9 - 10.3 mg/dL   Total Protein 7.2 6.5 - 8.1 g/dL   Albumin 4.2 3.5 - 5.0 g/dL   AST 25 15 - 41 U/L   ALT 18 17 - 63 U/L   Alkaline Phosphatase 66 38 - 126 U/L   Total Bilirubin 0.7 0.3 - 1.2 mg/dL   GFR calc non Af Amer >60 >60 mL/min   GFR calc Af Amer >60 >60 mL/min    Comment: (NOTE) The eGFR has been calculated using the CKD EPI equation. This calculation has not been validated in all clinical situations.  eGFR's persistently <60 mL/min signify possible Chronic Kidney Disease.    Anion gap 10 5 - 15  Ethanol     Status: None   Collection Time: 04/11/16 10:07 AM  Result Value Ref Range   Alcohol, Ethyl (B) <5 <5 mg/dL    Comment:        LOWEST DETECTABLE LIMIT FOR SERUM ALCOHOL IS 5 mg/dL FOR MEDICAL PURPOSES ONLY   Salicylate level     Status: None   Collection Time: 04/11/16 10:07 AM  Result Value Ref Range   Salicylate Lvl <4.2 2.8 - 30.0 mg/dL  Acetaminophen level     Status: Abnormal   Collection Time: 04/11/16 10:07 AM  Result Value Ref Range   Acetaminophen (Tylenol), Serum <10 (L) 10 - 30 ug/mL    Comment:        THERAPEUTIC CONCENTRATIONS VARY SIGNIFICANTLY. A RANGE OF 10-30 ug/mL MAY BE AN EFFECTIVE CONCENTRATION FOR MANY PATIENTS. HOWEVER, SOME ARE BEST TREATED AT CONCENTRATIONS OUTSIDE THIS RANGE. ACETAMINOPHEN CONCENTRATIONS >150 ug/mL AT 4 HOURS AFTER INGESTION AND >50 ug/mL AT 12 HOURS AFTER INGESTION ARE OFTEN ASSOCIATED WITH TOXIC REACTIONS.   Urine Drug Screen, Qualitative ([facility] only)     Status: Abnormal   Collection Time: 04/11/16 10:07 AM  Result Value Ref Range   Tricyclic, Ur Screen NONE DETECTED NONE DETECTED   Amphetamines, Ur Screen NONE DETECTED NONE DETECTED   MDMA (Ecstasy)Ur Screen NONE DETECTED NONE DETECTED   Cocaine Metabolite,Ur [place] POSITIVE (A) NONE DETECTED   Opiate, Ur Screen NONE  DETECTED NONE DETECTED   Phencyclidine (PCP) Ur S NONE DETECTED NONE DETECTED   Cannabinoid 50 Ng, Ur [place] NONE DETECTED NONE DETECTED   Barbiturates, Ur Screen NONE DETECTED NONE DETECTED   Benzodiazepine, Ur Scrn NONE DETECTED NONE DETECTED   Methadone Scn, Ur NONE DETECTED NONE DETECTED    Comment: (NOTE) 595  Tricyclics, urine               Cutoff 1000 ng/mL 200  Amphetamines, urine             Cutoff 1000 ng/mL 300  MDMA (Ecstasy), urine           Cutoff 500 ng/mL 400  Cocaine Metabolite, urine       Cutoff 300 ng/mL 500  Opiate, urine                   Cutoff 300 ng/mL 600  Phencyclidine (PCP), urine      Cutoff 25 ng/mL 700  Cannabinoid, urine              Cutoff 50 ng/mL 800  Barbiturates, urine             Cutoff 200 ng/mL 900  Benzodiazepine, urine           Cutoff 200 ng/mL 1000 Methadone, urine                Cutoff 300 ng/mL 1100 1200 The urine drug screen provides only a preliminary, unconfirmed 1300 analytical test result and should not be used for non-medical 1400 purposes. Clinical consideration and professional judgment should 1500 be applied to any positive drug screen result due to possible 1600 interfering substances. A more specific alternate chemical method 1700 must be used in order to obtain a confirmed analytical result.  1800 Gas chromato graphy / mass spectrometry (GC/MS) is the preferred 1900 confirmatory method.     Current Facility-Administered Medications  Medication Dose Route Frequency Provider Last Rate Last  Dose  . ARIPiprazole ER SUSR 400 mg  400 mg Intramuscular Once Gonzella Lex, MD       No current outpatient prescriptions on file.    Musculoskeletal: Strength & Muscle Tone: within normal limits Gait & Station: normal Patient leans: N/A  Psychiatric Specialty Exam: Physical Exam  Nursing note and vitals reviewed. Constitutional: He appears well-developed and well-nourished.  HENT:  Head: Normocephalic and atraumatic.  Eyes:  Conjunctivae are normal. Pupils are equal, round, and reactive to light.  Neck: Normal range of motion.  Cardiovascular: Regular rhythm and normal heart sounds.   Respiratory: Effort normal. No respiratory distress.  GI: Soft.  Musculoskeletal: Normal range of motion.  Neurological: He is alert.  Skin: Skin is warm and dry.  Psychiatric: His speech is normal and behavior is normal. His affect is blunt. Thought content is paranoid. Cognition and memory are impaired. He expresses impulsivity. He expresses no homicidal and no suicidal ideation.    Review of Systems  Constitutional: Negative.   HENT: Negative.   Eyes: Negative.   Respiratory: Negative.   Cardiovascular: Negative.   Gastrointestinal: Negative.   Musculoskeletal: Negative.   Skin: Negative.   Neurological: Negative.   Psychiatric/Behavioral: Negative for depression, hallucinations, memory loss, substance abuse and suicidal ideas. The patient is not nervous/anxious and does not have insomnia.     Blood pressure 123/79, pulse 76, temperature 98.3 F (36.8 C), temperature source Oral, resp. rate 16, height '6\' 1"'  (1.854 m), weight 88.5 kg (195 lb), SpO2 99 %.Body mass index is 25.73 kg/m.  General Appearance: Disheveled  Eye Contact:  Fair  Speech:  Garbled  Volume:  Decreased  Mood:  Dysphoric  Affect:  Constricted  Thought Process:  Disorganized  Orientation:  Full (Time, Place, and Person)  Thought Content:  Rumination  Suicidal Thoughts:  No  Homicidal Thoughts:  No  Memory:  Immediate;   Good Recent;   Good Remote;   Fair  Judgement:  Fair  Insight:  Fair  Psychomotor Activity:  Decreased  Concentration:  Concentration: Fair  Recall:  [employer] of Knowledge:  Fair  Language:  Fair  Akathisia:  No  Handed:  Right  AIMS (if indicated):     Assets:  Communication Skills Physical Health Resilience  ADL's:  Intact  Cognition:  WNL  Sleep:        Treatment Plan Summary: Medication management and Plan  65 year old man who clearly has schizophrenia and has some degree of thought disorder or paranoia and may be delusional. On the other hand I don't see any evidence that he is actually been a harm to anyone or to himself. The worse they can say about him is that he's been acting strangely in public. He looks like he is in reasonably good health and doesn't look like he's been living to rough. Furthermore he is agreeable to taking his medicine. Patient is agreeable to getting and Abilify shot. He will get the 400 mg long-acting injectable and then I am going to discontinue his IVC and he can be released from the emergency room. He is to follow-up with [facility] as he has done in the past and he states he will do so.  Disposition: Patient does not meet criteria for psychiatric inpatient admission. Supportive therapy provided about ongoing stressors.  Alethia Berthold, MD 04/11/2016 5:49 PM

## 2016-04-11 NOTE — ED Provider Notes (Signed)
[facility] Emergency Department Provider Note   ____________________________________________   First MD Initiated Contact with Patient 04/11/16 1008     (approximate)  I have reviewed the triage vital signs and the nursing notes.   HISTORY  Chief Complaint Aggressive Behavior  Caveat-history of present illness and review of systems Limited due to the patient's tangential speech, inability to contribute to a linear history.  HPI Miguel [name] is a 65 y.o. male with history of schizophrenia who presents under involuntary commitment by police for bizarre behavior. According to police, the patient is found wandering into traffic, wandering around a nightclub collecting trash. He has known psychiatric illness and has been off of his medications for some time. 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 He is unable to contribute anything to the story about what brings into the emergency department but is able to deny any chest pain, difficulty breathing or recent illness including no cough, vomiting, diarrhea, fevers or chills.   Past Medical History:  Diagnosis Date  . Schizophrenia (HCC)     There are no active problems to display for this patient.   Past Surgical History:  Procedure Laterality Date  . APPENDECTOMY      Prior to Admission medications   Not on File    Allergies Review of patient's allergies indicates no known allergies.  No family history on file.  Social History Social History  Substance Use Topics  . Smoking status: Never Smoker  . Smokeless tobacco: Never Used  . Alcohol use No    Review of Systems Constitutional: No fever/chills Eyes: No visual changes. ENT: No sore throat. Cardiovascular: Denies chest pain. Respiratory: Denies shortness of breath. Gastrointestinal: No abdominal pain.  No nausea, no vomiting.  No diarrhea.  No  constipation. Genitourinary: Negative for dysuria. Musculoskeletal: Negative for back pain. Skin: Negative for rash. Neurological: Negative for headaches, focal weakness or numbness.  10-point ROS otherwise negative.  ____________________________________________   PHYSICAL EXAM:  VITAL SIGNS: ED Triage Vitals  Enc Vitals Group     BP 04/11/16 0955 117/76     Pulse Rate 04/11/16 0955 88     Resp 04/11/16 0955 18     Temp 04/11/16 0955 98.4 F (36.9 C)     Temp Source 04/11/16 0955 Oral     SpO2 04/11/16 0955 99 %     Weight 04/11/16 0956 195 lb (88.5 kg)     Height 04/11/16 0956 6\' 1"  (1.854 m)     Head Circumference --      Peak Flow --      Pain Score --      Pain Loc --      Pain Edu? --      Excl. in GC? --     Constitutional: Alert and oriented. Nontoxic- appearing and in no acute distress. Eyes: Conjunctivae are normal. PERRL. EOMI. Head: Atraumatic. Nose: No congestion/rhinnorhea. Mouth/Throat: Mucous membranes are moist.  Oropharynx non-erythematous. Neck: No stridor. Supple without meningismus. Cardiovascular: Normal rate, regular rhythm. Grossly normal heart sounds.  Good peripheral circulation. Respiratory: Normal respiratory effort.  No retractions. Lungs CTAB. Gastrointestinal: Soft and nontender. No distention.  No CVA tenderness. Genitourinary: Deferred Musculoskeletal: No lower extremity tenderness nor edema.  No joint effusions. Neurologic:  Normal speech and language. No gross focal neurologic deficits are appreciated. No gait instability. Skin:  Skin is warm, dry and intact. No rash  noted. Psychiatric: Affect is flat, mood is normal, patient with tangential nonlinear speech, appears paranoid.  ____________________________________________   LABS (all labs ordered are listed, but only abnormal results are displayed)  Labs Reviewed  CBC WITH DIFFERENTIAL/PLATELET - Abnormal; Notable for the following:       Result Value   RBC 4.23 (*)    All  other components within normal limits  COMPREHENSIVE METABOLIC PANEL - Abnormal; Notable for the following:    Glucose, Bld 107 (*)    BUN 21 (*)    All other components within normal limits  ACETAMINOPHEN LEVEL - Abnormal; Notable for the following:    Acetaminophen (Tylenol), Serum <10 (*)    All other components within normal limits  URINE DRUG SCREEN, QUALITATIVE ([facility] ONLY) - Abnormal; Notable for the following:    Cocaine Metabolite,[place] POSITIVE (*)    All other components within normal limits  ETHANOL  SALICYLATE LEVEL   ____________________________________________  EKG  none ____________________________________________  RADIOLOGY  none ____________________________________________   PROCEDURES  Procedure(s) performed: None  Procedures  Critical Care performed: No  ____________________________________________   INITIAL IMPRESSION / ASSESSMENT AND PLAN / ED COURSE  Pertinent labs & imaging results that were available during my care of the patient were reviewed by me and considered in my medical decision making (see chart for details).  Miguel [name] is a 65 y.o. male with history of schizophrenia who presents under involuntary commitment by police for bizarre behavior. On exam, he is well-appearing and in no acute distress. His vital signs are stable and he is afebrile. He does appear mildly paranoid with tangential speech and I am concerned for some degree of psychosis. Plan for screening labs, we'll continue involuntary commitment, consult behavioral health and psychiatry.  ----------------------------------------- 3:56 PM on 04/11/2016 ----------------------------------------- Labs reviewed. Unremarkable CBC and CMP. Undetectable ethanol, salicylate and acetaminophen levels. Urine drug screen positive for cocaine. Patient is medically cleared for psychiatric evaluation.  Clinical Course     ____________________________________________   FINAL  CLINICAL IMPRESSION(S) / ED DIAGNOSES  Final diagnoses:  Schizophrenia, unspecified type (HCC)      NEW MEDICATIONS STARTED DURING THIS VISIT:  New Prescriptions   No medications on file     Note:  This document was prepared using Dragon voice recognition software and may include unintentional dictation errors.    Gayla DossEryka A Hafsah Hendler, MD 04/11/16 [phone]

## 2016-04-11 NOTE — Discharge Instructions (Signed)
Be sure to follow-up with your regular psychiatrist return for any further problems

## 2016-04-11 NOTE — ED Triage Notes (Signed)
Pt here IVC with [employer] officer from mental health office with pt having verbally aggressive behavior and paranoia

## 2018-07-12 ENCOUNTER — Other Ambulatory Visit: Payer: Self-pay

## 2018-07-12 ENCOUNTER — Inpatient Hospital Stay
Admission: EM | Admit: 2018-07-12 | Discharge: 2018-07-15 | DRG: 065 | Attending: Internal Medicine | Admitting: Internal Medicine

## 2018-07-12 ENCOUNTER — Emergency Department

## 2018-07-12 DIAGNOSIS — R471 Dysarthria and anarthria: Secondary | ICD-10-CM | POA: Diagnosis present

## 2018-07-12 DIAGNOSIS — R4702 Dysphasia: Secondary | ICD-10-CM | POA: Diagnosis present

## 2018-07-12 DIAGNOSIS — Z720 Tobacco use: Secondary | ICD-10-CM

## 2018-07-12 DIAGNOSIS — Z9119 Patient's noncompliance with other medical treatment and regimen: Secondary | ICD-10-CM | POA: Diagnosis not present

## 2018-07-12 DIAGNOSIS — G8191 Hemiplegia, unspecified affecting right dominant side: Secondary | ICD-10-CM | POA: Diagnosis present

## 2018-07-12 DIAGNOSIS — F209 Schizophrenia, unspecified: Secondary | ICD-10-CM | POA: Diagnosis present

## 2018-07-12 DIAGNOSIS — I639 Cerebral infarction, unspecified: Secondary | ICD-10-CM | POA: Diagnosis not present

## 2018-07-12 DIAGNOSIS — F1410 Cocaine abuse, uncomplicated: Secondary | ICD-10-CM | POA: Diagnosis present

## 2018-07-12 DIAGNOSIS — I63512 Cerebral infarction due to unspecified occlusion or stenosis of left middle cerebral artery: Secondary | ICD-10-CM | POA: Diagnosis present

## 2018-07-12 DIAGNOSIS — R29810 Facial weakness: Secondary | ICD-10-CM | POA: Diagnosis present

## 2018-07-12 DIAGNOSIS — R4701 Aphasia: Secondary | ICD-10-CM | POA: Diagnosis present

## 2018-07-12 DIAGNOSIS — Z79899 Other long term (current) drug therapy: Secondary | ICD-10-CM | POA: Diagnosis not present

## 2018-07-12 DIAGNOSIS — E785 Hyperlipidemia, unspecified: Secondary | ICD-10-CM | POA: Diagnosis present

## 2018-07-12 DIAGNOSIS — Z716 Tobacco abuse counseling: Secondary | ICD-10-CM

## 2018-07-12 DIAGNOSIS — R29706 NIHSS score 6: Secondary | ICD-10-CM | POA: Diagnosis present

## 2018-07-12 LAB — URINE DRUG SCREEN, QUALITATIVE (ARMC ONLY)
AMPHETAMINES, UR SCREEN: NOT DETECTED
BENZODIAZEPINE, UR SCRN: NOT DETECTED
Barbiturates, Ur Screen: NOT DETECTED
Cannabinoid 50 Ng, Ur ~~LOC~~: NOT DETECTED
Cocaine Metabolite,Ur ~~LOC~~: NOT DETECTED
MDMA (ECSTASY) UR SCREEN: NOT DETECTED
Methadone Scn, Ur: NOT DETECTED
Opiate, Ur Screen: NOT DETECTED
Phencyclidine (PCP) Ur S: NOT DETECTED
TRICYCLIC, UR SCREEN: NOT DETECTED

## 2018-07-12 LAB — COMPREHENSIVE METABOLIC PANEL
ALT: 17 U/L (ref 0–44)
ANION GAP: 9 (ref 5–15)
AST: 20 U/L (ref 15–41)
Albumin: 4.6 g/dL (ref 3.5–5.0)
Alkaline Phosphatase: 57 U/L (ref 38–126)
BUN: 19 mg/dL (ref 8–23)
CHLORIDE: 101 mmol/L (ref 98–111)
CO2: 29 mmol/L (ref 22–32)
Calcium: 9.7 mg/dL (ref 8.9–10.3)
Creatinine, Ser: 1.05 mg/dL (ref 0.61–1.24)
GFR calc non Af Amer: >60 mL/min (ref >60)
Glucose, Bld: 84 mg/dL (ref 70–99)
POTASSIUM: 4.3 mmol/L (ref 3.5–5.1)
Sodium: 139 mmol/L (ref 135–145)
TOTAL PROTEIN: 8.5 g/dL — AB (ref 6.5–8.1)
Total Bilirubin: 0.6 mg/dL (ref 0.3–1.2)

## 2018-07-12 LAB — CBC
HEMATOCRIT: 50.6 % (ref 39.0–52.0)
HEMOGLOBIN: 16.8 g/dL (ref 13.0–17.0)
MCH: 31.8 pg (ref 26.0–34.0)
MCHC: 33.2 g/dL (ref 30.0–36.0)
MCV: 95.8 fL (ref 80.0–100.0)
NRBC: 0 % (ref 0.0–0.2)
Platelets: 348 10*3/uL (ref 150–400)
RBC: 5.28 MIL/uL (ref 4.22–5.81)
RDW: 11.8 % (ref 11.5–15.5)
WBC: 5.2 10*3/uL (ref 4.0–10.5)

## 2018-07-12 LAB — URINALYSIS, ROUTINE W REFLEX MICROSCOPIC
BILIRUBIN URINE: NEGATIVE
Glucose, UA: NEGATIVE mg/dL
Hgb urine dipstick: NEGATIVE
Ketones, ur: NEGATIVE mg/dL
Leukocytes, UA: NEGATIVE
NITRITE: NEGATIVE
PROTEIN: NEGATIVE mg/dL
SPECIFIC GRAVITY, URINE: 1.01 (ref 1.005–1.030)
pH: 6 (ref 5.0–8.0)

## 2018-07-12 LAB — DIFFERENTIAL
Abs Immature Granulocytes: 0.03 10*3/uL (ref 0.00–0.07)
BASOS PCT: 1 %
Basophils Absolute: 0 10*3/uL (ref 0.0–0.1)
EOS ABS: 0.1 10*3/uL (ref 0.0–0.5)
EOS PCT: 1 %
IMMATURE GRANULOCYTES: 1 %
Lymphocytes Relative: 31 %
Lymphs Abs: 1.6 10*3/uL (ref 0.7–4.0)
MONO ABS: 0.4 10*3/uL (ref 0.1–1.0)
Monocytes Relative: 8 %
Neutro Abs: 3 10*3/uL (ref 1.7–7.7)
Neutrophils Relative %: 58 %

## 2018-07-12 LAB — ETHANOL: Alcohol, Ethyl (B): <10 mg/dL (ref <10)

## 2018-07-12 LAB — TROPONIN I: Troponin I: <0.03 ng/mL (ref <0.03)

## 2018-07-12 LAB — APTT: aPTT: 34 seconds (ref 24–36)

## 2018-07-12 LAB — PROTIME-INR
INR: 1.01
Prothrombin Time: 13.2 seconds (ref 11.4–15.2)

## 2018-07-12 MED ORDER — IOPAMIDOL (ISOVUE-370) INJECTION 76%
115.0000 mL | Freq: Once | INTRAVENOUS | Status: AC | PRN
  Administered 2018-07-12: 115 mL via INTRAVENOUS

## 2018-07-12 MED ORDER — ENOXAPARIN SODIUM 40 MG/0.4ML ~~LOC~~ SOLN
40.0000 mg | SUBCUTANEOUS | Status: DC
  Administered 2018-07-13 – 2018-07-14 (×2): 40 mg via SUBCUTANEOUS
  Filled 2018-07-12 (×2): qty 0.4

## 2018-07-12 MED ORDER — ACETAMINOPHEN 650 MG RE SUPP: 650.0000 mg | RECTAL | Status: DC | PRN

## 2018-07-12 MED ORDER — STROKE: EARLY STAGES OF RECOVERY BOOK
Freq: Once | Status: AC
  Administered 2018-07-13: 09:00:00

## 2018-07-12 MED ORDER — ACETAMINOPHEN 160 MG/5ML PO SOLN
650.0000 mg | ORAL | Status: DC | PRN
  Filled 2018-07-12: qty 20.3

## 2018-07-12 MED ORDER — ACETAMINOPHEN 325 MG PO TABS: 650.0000 mg | ORAL_TABLET | ORAL | Status: DC | PRN

## 2018-07-12 NOTE — ED Notes (Signed)
RN Erie NoeVanessa at bedside attempting IV access

## 2018-07-12 NOTE — ED Notes (Signed)
Pt's arm elevated and ice applied to right arm where contrast infiltrated.

## 2018-07-12 NOTE — ED Notes (Signed)
Headed to CT room 2 per Trey PaulaJeff

## 2018-07-12 NOTE — ED Notes (Signed)
Pt denies any headache, visual disturbances, chest pain or any other type of pain. Pt shows no right sided weakness asides from unable to grips hands when asked. MD at bedside, Several RNs at bedside attempting IV access with ultrasound.

## 2018-07-12 NOTE — ED Notes (Addendum)
Neuro MD performing partial NIHSS at this time.

## 2018-07-12 NOTE — ED Notes (Signed)
BS 72

## 2018-07-12 NOTE — ED Notes (Signed)
Pt back from CT. Pt still unable to state where he is or month of year. Pt reoriented at this time. Pt resting comfortably in bed at this time. Officer at bedside.

## 2018-07-12 NOTE — ED Notes (Signed)
Pt asking for food. Will check with MD

## 2018-07-12 NOTE — ED Notes (Signed)
Neuro MD paged. Last known well around 12 noon. About 30 minutes ago pt was found unresponsive and pupils unresponsive. Pt has regained some. RN Inetta Fermoina on screen

## 2018-07-12 NOTE — ED Notes (Signed)
Attempted to call report. RN Aram BeechamCynthia will call back shortly to get report. She states they are going to change pt's room assignment. Will inform RN Erie NoeVanessa taking over.

## 2018-07-12 NOTE — ED Notes (Signed)
MRI called with no answer. CT called and went over to MRI doors locked. RN Charge informed of needing to call some in. Butch RN attempting access at this time. Lillia AbedLindsay RN aware of IV attempts and unable to get in touch with MRI. Lillia AbedLindsay logging off and will call back to check in.

## 2018-07-12 NOTE — ED Notes (Signed)
Still attempting IV access 18g for CT perfusion. Butch and Vanessa at bedside.

## 2018-07-12 NOTE — Consult Note (Signed)
[facility] Tele[facility]urology Consult Services   TeleStroke Metrics: LKW: 1200 Door Time: 1804  [facility] Contacted: 1824 [facility] at Bedside: 1827 NIHSS: 1829 Decision on Alteplase: Not to give as his last known well time is greater than 4.5 hours prior to his presentation. Interventional Candidate: Likely not a candidate as his symptoms are not consistent with a large vessel proximal occlusion.   Chief Complaint: Altered mental status with right-sided weakness and aphasia   HPI: Asked to see this patient in emergent telemedicine consultation utilizing interactive audio and video technologies. ?Consultation was performed with assistance of ancillary / medical staff at bedside.   Verbal consent to perform the examination with telemedicine was obtained. Patient agreed to proceed with the consultation for acute stroke protocol.  67 year old African-American male who was brought to the emergency room by EMS as a stroke alert for altered mental status and right-sided weakness.  Patient is currently confused and a poor historian.  Review of the medical records showed a history of schizophrenia.  Patient has been incarcerated for possession of drug paraphernalia and drugs.  Patient was last known well by the prison guards at 12 PM today.  Then they recently found him unresponsive.  He had unreactive pupils and was noted to have right-sided weakness, right facial droop, and aphasia.  Therefore, a stroke alert was initiated.  Currently on examination, the patient has no obvious focal weakness.  He is able to sit up on his own.  He is having trouble answering questions but is following some commands.  He still has a significant expressive aphasia.  Patient apparently told EMS that he does have a history of seizures.   PMH: Schizophrenia and possible seizures.   SOC: Positive for tobacco abuse.  Prior cocaine abuse.  Patient is currently incarcerated for drug possession.   FMH: Positive  for possible seizures.   ROS: 13 point review systems were reviewed with the patient, and are all negative with the exception of the aforementioned in the history of present illness.   VS: Blood pressure 122/89, respiration 18, pulse 62, oxygen saturation 100%   Exam: Patient is in no apparent distress.  Patient appears as stated age.  No obvious acute respiratory or cardiac distress.  Patient is well groomed and well-nourished. 1a- LOC: Keenly responsive - 0     1b- LOC questions: Answers neither questions correctly - 2    1c- LOC commands- Performs both tasks correctly- 0    2- Gaze: Normal; no gaze paresis or gaze deviation - 0    3- Visual Fields: normal, no Visual field deficit - 0    4- Facial movements: no facial palsy - 0    5- Upper limb motor - no drift - 0    6- Lower limb motor - no drift - 0     7- Limb Coordination: absent ataxia - 0     8- Sensory: no sensory loss - 0     9- Language - Severe aphasia - 2  10- Speech - Severe dysarthria - 2    11- Neglect / Extinction - none found - 0   NIHSS score:  6   Diagnostic Data: CT of the head showed no acute intracranial process   Medical Data Reviewed:   1.Data?reviewed include clinical labs, radiology,?and medical tests;   2.Tests?results discussed w/performing or interpreting physician;   3.Obtaining/reviewing old medical records;   4.Obtaining?case history from another source;   5.Independent?review of image, tracing, or specimen.    Medical Decision Making:   -  Extensive number of diagnosis or management options are considered below.   - Extensive amount of complex data reviewed.   - High risk of complication and/or morbidity or mortality are associated with differential diagnostic considerations below.   - There may be?uncertain?outcome and increased probability of prolonged functional impairment or high probability of severe prolonged functional impairment associated with some of these differential diagnosis.     Differential Diagnosis for Stroke:   1.?Cardioembolic?stroke   2. Small vessel disease/lacune   3. Thromboembolic, artery-to-artery mechanism   4.?Hypercoagulable?state-related infarct   5. Transient ischemic attack   6. Thrombotic mechanism, large artery disease    Assessment: 1.  Acute left MCA syndrome possibly due to subclinical seizure versus stroke 2.  Schizophrenia 3.  Tobacco abuse 4.  Prior cocaine abuse   Recommendations: ER team was unable to obtain CTA imaging due to lack of IV access. Plan at this point is to try and obtain a stat MRI brain without contrast to further evaluate his symptoms.  Can also include a MRA of the head without contrast as well to look at his intracranial vessels.  Patient ultimately can be admitted to the hospital for further work-up of his symptoms. Consult local neurology team to assist with evaluation and management. Check an echocardiogram to gauge his cardiac function if he is found to have a stroke. Check an EEG to rule out subclinical seizure. Maintain the patient on telemetry to look for paroxysmal atrial fibrillation. Check hemoglobin A1c, lipid panel, and urine drug screen. Start the patient on a baby aspirin. Allow permissive hypertension. Consult PT, OT, and ST. Continue supportive care.   Thank you for allowing [facility] to participate in the care of your patient. Please call me, Dr. Adrienne Mocha, with any questions at [phone]. Case discussed with the ER staff and Dr. Derrill Kay.   Critical Care notation:   I was called to see this critical patient emergently. I personally evaluated this critical patient for acute stroke evaluation, and determining their eligibility for IV Alteplase and interventional therapies.  I have spent approximately 33 minutes with the patient, including time at bedside, time discussing the case with other physicians, reviewing plan of care, and time independently reviewing the records and scans.

## 2018-07-12 NOTE — ED Notes (Signed)
IV access gained by MD Derrill KayGoodman, unable to get blood at this time. Pt to head to CT and will attempt blood once patient arrives back.

## 2018-07-12 NOTE — ED Notes (Signed)
Attempting IV access by multiple nurses

## 2018-07-12 NOTE — ED Notes (Signed)
Per MD Derrill KayGoodman take pt to CT for perfusion and if unable to perform test, get orbital scan and take pt immediatly to MRI. MRI aware and CT aware.

## 2018-07-12 NOTE — ED Triage Notes (Signed)
[facility] [place] [facility]. EMS reports pt unable to communicate when they first arrived.  Last normal report 12noon today. Pt arrives with aphasia.  Per EMS Nurse from jail reported right arm flaccid and pinpoint pupils. VSS per EMS, no BS reported. SR on monitor.

## 2018-07-12 NOTE — ED Notes (Addendum)
Headed to CT, Technical sales engineerfficer at bedside, [employer] transporting pt. CT called and aware of pt's status

## 2018-07-12 NOTE — ED Notes (Signed)
Spoke with Electrical engineerChelsea RN from jail. Per her report officers did check at 17:15 today and called her. She reports once she arrived she found pt to have right sided weakness, unable to stand, nonreactive pupils and unable to communicate at all.

## 2018-07-12 NOTE — Plan of Care (Signed)
  Problem: Education: Goal: Knowledge of General Education information will improve Description Including pain rating scale, medication(s)/side effects and non-pharmacologic comfort measures Outcome: Progressing   Problem: Health Behavior/Discharge Planning: Goal: Ability to manage health-related needs will improve Outcome: Progressing   Problem: Clinical Measurements: Goal: Ability to maintain clinical measurements within normal limits will improve Outcome: Progressing Goal: Will remain free from infection Outcome: Progressing Goal: Diagnostic test results will improve Outcome: Progressing Goal: Respiratory complications will improve Outcome: Progressing Goal: Cardiovascular complication will be avoided Outcome: Progressing   Problem: Activity: Goal: Risk for activity intolerance will decrease Outcome: Progressing   Problem: Nutrition: Goal: Adequate nutrition will be maintained Outcome: Progressing   Problem: Coping: Goal: Level of anxiety will decrease Outcome: Progressing   Problem: Elimination: Goal: Will not experience complications related to bowel motility Outcome: Progressing Goal: Will not experience complications related to urinary retention Outcome: Progressing   Problem: Pain Managment: Goal: General experience of comfort will improve Outcome: Progressing   Problem: Safety: Goal: Ability to remain free from injury will improve Outcome: Progressing   Problem: Skin Integrity: Goal: Risk for impaired skin integrity will decrease Outcome: Progressing   Problem: Education: Goal: Knowledge of disease or condition will improve Outcome: Progressing Goal: Knowledge of secondary prevention will improve Outcome: Progressing Goal: Knowledge of patient specific risk factors addressed and post discharge goals established will improve Outcome: Progressing   Problem: Health Behavior/Discharge Planning: Goal: Ability to manage health-related needs will  improve Outcome: Progressing   Problem: Nutrition: Goal: Risk of aspiration will decrease Outcome: Progressing Goal: Dietary intake will improve Outcome: Progressing

## 2018-07-12 NOTE — H&P (Signed)
[facility] - [place] at [facility]   PATIENT NAME: Miguel [name]    MR#:  [id number]  DATE OF BIRTH:  02-Jun-1951  DATE OF ADMISSION:  07/12/2018  PRIMARY CARE PHYSICIAN: Patient, No Pcp Per   REQUESTING/REFERRING PHYSICIAN: Derrill Kay, MD  CHIEF COMPLAINT:   Chief Complaint  Patient presents with  . Altered Mental Status    HISTORY OF PRESENT ILLNESS:  Miguel [name]  is a 67 y.o. male who presents with chief complaint as above.  [facility]-sided [facility].  He states that this occurred in the afternoon around 5:00.  But he states that it had occurred prior to that and then gone away, and then recurred at 5:00 in state.  His last known normal was before noon.  He is unable to contribute significant information to his HPI as he does have some dysarthria, and also has some expressive dysphasia, with some intermittent word salad.  He was found to have acute infarct on his CTA in the ED.  Hospitalist were called for admission  PAST MEDICAL HISTORY:   Past Medical History:  Diagnosis Date  . Schizophrenia (HCC)      PAST SURGICAL HISTORY:   Past Surgical History:  Procedure Laterality Date  . APPENDECTOMY       SOCIAL HISTORY:   Social History   Tobacco Use  . Smoking status: Never Smoker  . Smokeless tobacco: Never Used  Substance Use Topics  . Alcohol use: No     FAMILY HISTORY:     DRUG ALLERGIES:  No Known Allergies  MEDICATIONS AT HOME:   Prior to Admission medications   Medication Sig Start Date End Date Taking? Authorizing Provider  ABILIFY MAINTENA 400 MG PRSY prefilled syringe Inject 400 mg into the muscle every 30 (thirty) days. 06/27/18   [provider]    REVIEW OF SYSTEMS:  Review of Systems  Constitutional: Negative for chills, fever, malaise/fatigue and weight loss.  HENT: Negative for ear pain, hearing loss and tinnitus.   Eyes: Negative for blurred vision, double  vision, pain and redness.  Respiratory: Negative for cough, hemoptysis and shortness of breath.   Cardiovascular: Negative for chest pain, palpitations, orthopnea and leg swelling.  Gastrointestinal: Negative for abdominal pain, constipation, diarrhea, nausea and vomiting.  Genitourinary: Negative for dysuria, frequency and hematuria.  Musculoskeletal: Negative for back pain, joint pain and neck pain.  Skin:       No acne, rash, or lesions  Neurological: Positive for speech change and weakness. Negative for dizziness, tremors and focal weakness.  Endo/Heme/Allergies: Negative for polydipsia. Does not bruise/bleed easily.  Psychiatric/Behavioral: Negative for depression. The patient is not nervous/anxious and does not have insomnia.      VITAL SIGNS:   Vitals:   07/12/18 1939 07/12/18 2015 07/12/18 2100 07/12/18 2227  BP:  124/66 128/64 118/90  Pulse: (!) 55 (!) 56 60 65  Resp: (!) 21 20 20 19   SpO2: 100% 100% 100% 100%  Weight:      Height:       Wt Readings from Last 3 Encounters:  07/12/18 79.4 kg  04/11/16 88.5 kg  05/27/15 88.5 kg    PHYSICAL EXAMINATION:  Physical Exam  Vitals reviewed. Constitutional: He is oriented to person, place, and time. He appears well-developed and well-nourished. No distress.  HENT:  Head: Normocephalic and atraumatic.  Mouth/Throat: Oropharynx is clear and moist.  Eyes: Pupils are equal, round, and reactive to light. Conjunctivae and EOM  are normal. No scleral icterus.  Neck: Normal range of motion. Neck supple. No JVD present. No thyromegaly present.  Cardiovascular: Normal rate, regular rhythm and intact distal pulses. Exam reveals no gallop and no friction rub.  No murmur heard. Respiratory: Effort normal and breath sounds normal. No respiratory distress. He has no wheezes. He has no rales.  GI: Soft. Bowel sounds are normal. He exhibits no distension. There is no tenderness.  Musculoskeletal: Normal range of motion. He exhibits no edema.   No arthritis, no gout  Lymphadenopathy:    He has no cervical adenopathy.  Neurological: He is alert and oriented to person, place, and time. No cranial nerve deficit.  Neurologic: Cranial nerves II-XII intact, Sensation intact to light touch/pinprick, 5/5 strength in left extremities, 4/5 strength right extremities, + dysarthria, intermittent expressive aphasia with intermittent word salad, memory intact, + pronator drift  Skin: Skin is warm and dry. No rash noted. No erythema.  Psychiatric: He has a normal mood and affect. His behavior is normal. Judgment and thought content normal.    LABORATORY PANEL:   CBC Recent Labs  Lab 07/12/18 1918  WBC 5.2  HGB 16.8  HCT 50.6  PLT 348   ------------------------------------------------------------------------------------------------------------------  Chemistries  Recent Labs  Lab 07/12/18 1918  NA 139  K 4.3  CL 101  CO2 29  GLUCOSE 84  BUN 19  CREATININE 1.05  CALCIUM 9.7  AST 20  ALT 17  ALKPHOS 57  BILITOT 0.6   ------------------------------------------------------------------------------------------------------------------  Cardiac Enzymes Recent Labs  Lab 07/12/18 1918  TROPONINI <0.03   ------------------------------------------------------------------------------------------------------------------  RADIOLOGY:  Ct Angio Head W Or Wo Contrast  Result Date: 07/12/2018 CLINICAL DATA:  67 year old male with loss of speech and right side weakness. EXAM: CT ANGIOGRAPHY HEAD AND NECK CT PERFUSION BRAIN TECHNIQUE: Multidetector CT imaging of the head and neck was performed using the standard protocol during bolus administration of intravenous contrast. Multiplanar CT image reconstructions and MIPs were obtained to evaluate the vascular anatomy. Carotid stenosis measurements (when applicable) are obtained utilizing NASCET criteria, using the distal internal carotid diameter as the denominator. Multiphase CT imaging of  the brain was performed following IV bolus contrast injection. Subsequent parametric perfusion maps were calculated using RAPID software. CONTRAST:  ISOVUE-370 IOPAMIDOL (ISOVUE-370) INJECTION 76% COMPARISON:  Noncontrast head CT 1853 hours today. FINDINGS: CT Brain Perfusion Findings: CBF (<30%) Volume: 9mL Perfusion (Tmax>6.0s) volume: 53mL Mismatch Volume: 44mL Infarction Location:Left anterior MCA, inferior frontal operculum. CTA NECK Skeleton: Advanced degenerative disease in the cervical spine. Carious dentition. No acute osseous abnormality identified. Upper chest: Centrilobular emphysema. Otherwise negative. Other neck: Negative. Aortic arch: 3 vessel arch configuration. Soft and calcified arch atherosclerosis with no great vessel origin stenosis. Right carotid system: Negative. Left carotid system: Intermittent soft plaque in the left CCA without stenosis. The left carotid bifurcation appears normal. No cervical left ICA plaque or stenosis. Vertebral arteries: Proximal right subclavian artery and right vertebral artery origin are normal. The right vertebral artery is patent with tortuosity but no stenosis to the skull base. No proximal left subclavian artery stenosis despite some plaque. Normal left vertebral artery origin. Patent left vertebral artery to the skull base. CTA HEAD Posterior circulation: Patent distal vertebral arteries with tortuosity but no stenosis. Patent vertebrobasilar junction and basilar artery without stenosis. Patent SCA and PCA origins. Left posterior communicating artery is present with a fetal type left PCA origin. The right posterior communicating is diminutive or absent. There is mild to moderate irregularity and stenosis  of the non dominant left P1, but otherwise the bilateral PCA branches are normal. Anterior circulation: Both ICA siphons are patent. Mild siphon stenosis appears greater on the right. No stenosis. Normal ophthalmic and left posterior communicating artery  origins. Patent carotid termini. Patent MCA and ACA origins. Anterior communicating artery and bilateral ACA branches are within normal limits. Right MCA M1 segment, bifurcation, and right MCA branches are within normal limits. The left M1 and left MCA trifurcation are patent without stenosis. There are 3 different right M3 branch occlusions identified on series 13, images 33 and 34, and also annotated on the thin sagittal images series 10. There is no M2 branch occlusion. Venous sinuses: Patent. Anatomic variants: Fetal type left PCA origin. Delayed phase: Possible cortical hypodensity in the left inferior frontal gyrus now on series 14, image 11, but no other CT changes of acute cortical infarct identified. No intracranial hemorrhage or mass effect. No abnormal enhancement identified. Review of the MIP images confirms the above findings IMPRESSION: 1. CT perfusion detects an acute Left MCA territory infarct - anterior division core infarct of 9 mL - penumbra volume in the anterior and posterior divisions of 53 mL. 2. However, CTA does NOT reveal large vessel occlusion. Rather there are three different Left MCA M3 branch occlusions identified. 3. The above was discussed by telephone with Dr. Phineas SemenGRAYDON GOODMAN on 07/12/2018 at 2114 hours. 4. Carotid atherosclerosis in the head and neck without significant stenosis. 5. Fetal type Left PCA origin with mild to moderate irregularity and stenosis in the left P1, but might not be consequential due to the normal left Pcomm supply. Electronically Signed   By: Odessa FlemingH  Hall M.D.   On: 07/12/2018 21:25   Ct Angio Neck W Or Wo Contrast  Result Date: 07/12/2018 CLINICAL DATA:  67 year old male with loss of speech and right side weakness. EXAM: CT ANGIOGRAPHY HEAD AND NECK CT PERFUSION BRAIN TECHNIQUE: Multidetector CT imaging of the head and neck was performed using the standard protocol during bolus administration of intravenous contrast. Multiplanar CT image reconstructions and  MIPs were obtained to evaluate the vascular anatomy. Carotid stenosis measurements (when applicable) are obtained utilizing NASCET criteria, using the distal internal carotid diameter as the denominator. Multiphase CT imaging of the brain was performed following IV bolus contrast injection. Subsequent parametric perfusion maps were calculated using RAPID software. CONTRAST:  115mL ISOVUE-370 IOPAMIDOL (ISOVUE-370) INJECTION 76% COMPARISON:  Noncontrast head CT 1853 hours today. FINDINGS: CT Brain Perfusion Findings: CBF (<30%) Volume: 9mL Perfusion (Tmax>6.0s) volume: 53mL Mismatch Volume: 44mL Infarction Location:Left anterior MCA, inferior frontal operculum. CTA NECK Skeleton: Advanced degenerative disease in the cervical spine. Carious dentition. No acute osseous abnormality identified. Upper chest: Centrilobular emphysema. Otherwise negative. Other neck: Negative. Aortic arch: 3 vessel arch configuration. Soft and calcified arch atherosclerosis with no great vessel origin stenosis. Right carotid system: Negative. Left carotid system: Intermittent soft plaque in the left CCA without stenosis. The left carotid bifurcation appears normal. No cervical left ICA plaque or stenosis. Vertebral arteries: Proximal right subclavian artery and right vertebral artery origin are normal. The right vertebral artery is patent with tortuosity but no stenosis to the skull base. No proximal left subclavian artery stenosis despite some plaque. Normal left vertebral artery origin. Patent left vertebral artery to the skull base. CTA HEAD Posterior circulation: Patent distal vertebral arteries with tortuosity but no stenosis. Patent vertebrobasilar junction and basilar artery without stenosis. Patent SCA and PCA origins. Left posterior communicating artery is present with a fetal type  left PCA origin. The right posterior communicating is diminutive or absent. There is mild to moderate irregularity and stenosis of the non dominant left  P1, but otherwise the bilateral PCA branches are normal. Anterior circulation: Both ICA siphons are patent. Mild siphon stenosis appears greater on the right. No stenosis. Normal ophthalmic and left posterior communicating artery origins. Patent carotid termini. Patent MCA and ACA origins. Anterior communicating artery and bilateral ACA branches are within normal limits. Right MCA M1 segment, bifurcation, and right MCA branches are within normal limits. The left M1 and left MCA trifurcation are patent without stenosis. There are 3 different right M3 branch occlusions identified on series 13, images 33 and 34, and also annotated on the thin sagittal images series 10. There is no M2 branch occlusion. Venous sinuses: Patent. Anatomic variants: Fetal type left PCA origin. Delayed phase: Possible cortical hypodensity in the left inferior frontal gyrus now on series 14, image 11, but no other CT changes of acute cortical infarct identified. No intracranial hemorrhage or mass effect. No abnormal enhancement identified. Review of the MIP images confirms the above findings IMPRESSION: 1. CT perfusion detects an acute Left MCA territory infarct - anterior division core infarct of 9 mL - penumbra volume in the anterior and posterior divisions of 53 mL. 2. However, CTA does NOT reveal large vessel occlusion. Rather there are three different Left MCA M3 branch occlusions identified. 3. The above was discussed by telephone with Dr. Phineas Semen on 07/12/2018 at 2114 hours. 4. Carotid atherosclerosis in the head and neck without significant stenosis. 5. Fetal type Left PCA origin with mild to moderate irregularity and stenosis in the left P1, but might not be consequential due to the normal left Pcomm supply. Electronically Signed   By: Odessa Fleming M.D.   On: 07/12/2018 21:25   Ct Cerebral Perfusion W Contrast  Result Date: 07/12/2018 CLINICAL DATA:  67 year old male with loss of speech and right side weakness. EXAM: CT  ANGIOGRAPHY HEAD AND NECK CT PERFUSION BRAIN TECHNIQUE: Multidetector CT imaging of the head and neck was performed using the standard protocol during bolus administration of intravenous contrast. Multiplanar CT image reconstructions and MIPs were obtained to evaluate the vascular anatomy. Carotid stenosis measurements (when applicable) are obtained utilizing NASCET criteria, using the distal internal carotid diameter as the denominator. Multiphase CT imaging of the brain was performed following IV bolus contrast injection. Subsequent parametric perfusion maps were calculated using RAPID software. CONTRAST:  ISOVUE-370 IOPAMIDOL (ISOVUE-370) INJECTION 76% COMPARISON:  Noncontrast head CT 1853 hours today. FINDINGS: CT Brain Perfusion Findings: CBF (<30%) Volume: 9mL Perfusion (Tmax>6.0s) volume: 53mL Mismatch Volume: 44mL Infarction Location:Left anterior MCA, inferior frontal operculum. CTA NECK Skeleton: Advanced degenerative disease in the cervical spine. Carious dentition. No acute osseous abnormality identified. Upper chest: Centrilobular emphysema. Otherwise negative. Other neck: Negative. Aortic arch: 3 vessel arch configuration. Soft and calcified arch atherosclerosis with no great vessel origin stenosis. Right carotid system: Negative. Left carotid system: Intermittent soft plaque in the left CCA without stenosis. The left carotid bifurcation appears normal. No cervical left ICA plaque or stenosis. Vertebral arteries: Proximal right subclavian artery and right vertebral artery origin are normal. The right vertebral artery is patent with tortuosity but no stenosis to the skull base. No proximal left subclavian artery stenosis despite some plaque. Normal left vertebral artery origin. Patent left vertebral artery to the skull base. CTA HEAD Posterior circulation: Patent distal vertebral arteries with tortuosity but no stenosis. Patent vertebrobasilar junction and basilar artery  without stenosis. Patent  SCA and PCA origins. Left posterior communicating artery is present with a fetal type left PCA origin. The right posterior communicating is diminutive or absent. There is mild to moderate irregularity and stenosis of the non dominant left P1, but otherwise the bilateral PCA branches are normal. Anterior circulation: Both ICA siphons are patent. Mild siphon stenosis appears greater on the right. No stenosis. Normal ophthalmic and left posterior communicating artery origins. Patent carotid termini. Patent MCA and ACA origins. Anterior communicating artery and bilateral ACA branches are within normal limits. Right MCA M1 segment, bifurcation, and right MCA branches are within normal limits. The left M1 and left MCA trifurcation are patent without stenosis. There are 3 different right M3 branch occlusions identified on series 13, images 33 and 34, and also annotated on the thin sagittal images series 10. There is no M2 branch occlusion. Venous sinuses: Patent. Anatomic variants: Fetal type left PCA origin. Delayed phase: Possible cortical hypodensity in the left inferior frontal gyrus now on series 14, image 11, but no other CT changes of acute cortical infarct identified. No intracranial hemorrhage or mass effect. No abnormal enhancement identified. Review of the MIP images confirms the above findings IMPRESSION: 1. CT perfusion detects an acute Left MCA territory infarct - anterior division core infarct of 9 mL - penumbra volume in the anterior and posterior divisions of 53 mL. 2. However, CTA does NOT reveal large vessel occlusion. Rather there are three different Left MCA M3 branch occlusions identified. 3. The above was discussed by telephone with Dr. Phineas Semen on 07/12/2018 at 2114 hours. 4. Carotid atherosclerosis in the head and neck without significant stenosis. 5. Fetal type Left PCA origin with mild to moderate irregularity and stenosis in the left P1, but might not be consequential due to the normal  left Pcomm supply. Electronically Signed   By: Odessa Fleming M.D.   On: 07/12/2018 21:25   Ct Head Code Stroke Wo Contrast`  Result Date: 07/12/2018 CLINICAL DATA:  Code stroke. 67 year old male with right side weakness, loss of speech. By report, last seen normal at noon today. EXAM: CT HEAD WITHOUT CONTRAST TECHNIQUE: Contiguous axial images were obtained from the base of the skull through the vertex without intravenous contrast. COMPARISON:  Report of noncontrast head CT 06/08/1998 (no images available). FINDINGS: Brain: No midline shift, ventriculomegaly, mass effect, evidence of mass lesion, intracranial hemorrhage or evidence of cortically based acute infarction. Gray-white matter differentiation is within normal limits throughout the brain. Vascular: Mild Calcified atherosclerosis at the skull base. No suspicious intracranial vascular hyperdensity. Skull: Negative. Sinuses/Orbits: Visualized paranasal sinuses and mastoids are well pneumatized. Other: Orbit and scalp soft tissues are within normal limits. ASPECTS [facility] Stroke Program Early CT Score) - Ganglionic level infarction (caudate, lentiform nuclei, internal capsule, insula, M1-M3 cortex): 7 - Supraganglionic infarction (M4-M6 cortex): 3 Total score (0-10 with 10 being normal): 10 IMPRESSION: 1. Normal noncontrast CT appearance of the brain. 2. ASPECTS is 10. 3. Study discussed by telephone with Dr. Derrill Kay in the ED on 07/12/2018 at 19:14 . We discussed that a CTA - CT Perfusion was planned but that two IV accesses have failed. So I recommended stat MRI brain diffusion imaging and time-of-flight circle-of-[place]k MRA instead. Electronically Signed   By: Odessa Fleming M.D.   On: 07/12/2018 19:16    EKG:   Orders placed or performed during the hospital encounter of 07/12/18  . ED EKG  . ED EKG  . EKG 12-Lead  . EKG  12-Lead    IMPRESSION AND PLAN:  Principal Problem:   Stroke [facility]) -left-sided MCA region stroke seen on CTA head.  We will admit  per stroke admission order set with appropriate labs, consults, including neurology consult.  We will not get any further MRI imaging as his CTA shows the stroke and also serves as a vascular test, defer to neurology for any further recommendations on imaging Active Problems:   Schizophrenia (HCC) -patient receives Abilify injection every 30 days, he does not need it at this time.  Chart review performed and case discussed with ED provider. Labs, imaging and/or ECG reviewed by provider and discussed with patient/family. Management plans discussed with the patient and/or family.  DVT PROPHYLAXIS: SubQ lovenox   GI PROPHYLAXIS:  None  ADMISSION STATUS: Inpatient     CODE STATUS: Full  TOTAL TIME TAKING CARE OF THIS PATIENT: 45 minutes.   Rayland Hamed FIELDING 07/12/2018, 10:41 PM  [facility]  Office  [phone]  CC: Primary care physician; Patient, No Pcp Per  Note:  This document was prepared using Dragon voice recognition software and may include unintentional dictation errors.

## 2018-07-12 NOTE — ED Notes (Signed)
RN Lillia AbedLindsay on screen at this time. MD Neuro with another stroke call but has been paged for this pt.

## 2018-07-12 NOTE — ED Notes (Addendum)
Neuro MD on computer at this time. IV access still being attempted by MD and RNs.  Sombutmai on computer at this time. Pt was able to answer yes that he has hx of seizures. MD Sombutmai aware.

## 2018-07-12 NOTE — ED Notes (Signed)
Call from RN Morrie Sheldonshley with reports of 18g IV no good at this time. MD notified. CT head completed, perfusion test unable to perform at this time

## 2018-07-12 NOTE — ED Notes (Signed)
Pt trying to communicate. Pt able to follow some commands. Pt unable to squeeze hands. Pt is alert and reactive to pain with IV attempts.

## 2018-07-12 NOTE — ED Notes (Signed)
IV access by Butch RN unsuccessful. Will inform MD.

## 2018-07-12 NOTE — ED Notes (Signed)
RN Erie NoeVanessa at bedside attempting to regain IV access where she previously got it

## 2018-07-12 NOTE — ED Notes (Signed)
Secretary Emilie called MRI and tech stated it would be 40 minutes before they could get here.

## 2018-07-12 NOTE — ED Notes (Signed)
Pt finished meal tray. Pt resting comfortably at this time

## 2018-07-12 NOTE — ED Provider Notes (Signed)
[facility] Emergency Department Provider Note   ____________________________________________   I have reviewed the triage vital signs and the nursing notes.   HISTORY  Chief Complaint Stroke like symptoms  History limited by and level 5 caveat due to: aphasia   HPI Miguel [name] is a 67 y.o. male who presents to the emergency department today from detention facility because of concern for stroke like symptoms. Apparently the patient was last seen normal around noon. The patient was then found with right sided weakness. He was also found to have aphasia.  Per EMS the patients symptoms have been improving. The patient does state that he has a history of seizure. However given the aphasia the patient does have a hard time giving any history.   Per medical record review patient has a history of schizophrenia  Past Medical History:  Diagnosis Date  . Schizophrenia [name]     Patient Active Problem List   Diagnosis Date Noted  . Schizophrenia (HCC) 04/11/2016  . Noncompliance 04/11/2016    Past Surgical History:  Procedure Laterality Date  . APPENDECTOMY      Prior to Admission medications   Not on File    Allergies Patient has no known allergies.  No family history on file.  Social History Social History   Tobacco Use  . Smoking status: Never Smoker  . Smokeless tobacco: Never Used  Substance Use Topics  . Alcohol use: No  . Drug use: No    Review of Systems Unable to obtain secondary to aphasia.  ____________________________________________   PHYSICAL EXAM:  VITAL SIGNS: ED Triage Vitals [07/12/18 1812]  Enc Vitals Group     BP      Pulse      Resp      Temp      Temp src      SpO2      Weight 175 lb (79.4 kg)     Height 6\' 1"  (1.854 m)     Head Circumference      Peak Flow      Pain Score 0     Pain Loc      Pain Edu?      Excl. in GC?      Constitutional: Awake and alert. Oriented to name.  Eyes:  Conjunctivae are normal.  ENT      Head: Normocephalic and atraumatic.      Nose: No congestion/rhinnorhea.      Mouth/Throat: Mucous membranes are moist.      Neck: No stridor. Hematological/Lymphatic/Immunilogical: No cervical lymphadenopathy. Cardiovascular: Normal rate, regular rhythm.  No murmurs, rubs, or gallops.  Respiratory: Normal respiratory effort without tachypnea nor retractions. Breath sounds are clear and equal bilaterally. No wheezes/rales/rhonchi. Gastrointestinal: Soft and non tender. No rebound. No guarding.  Genitourinary: Deferred Musculoskeletal: Normal range of motion in all extremities. No lower extremity edema. Neurologic:  Aphasia. Slight weakness to right arm. PERRL. EOMI.  Skin:  Skin is warm, dry and intact. No rash noted.  ____________________________________________    LABS (pertinent positives/negatives)    ____________________________________________   EKG  IPhineas Semen, Bellany Elbaum, attending physician, personally viewed and interpreted this EKG  EKG Time: 1813 Rate: 59 Rhythm: sinus bradycardia Axis: normal Intervals: qtc 380 QRS: narrow, q waves V1-v5 ST changes: no st elevation Impression: abnormal ekg  ____________________________________________    RADIOLOGY  CT head No acute findings  CTA head Concern for acute stroke with M3 occlusions  ____________________________________________   PROCEDURES  Procedures  ____________________________________________  INITIAL IMPRESSION / ASSESSMENT AND PLAN / ED COURSE  Pertinent labs & imaging results that were available during my care of the patient were reviewed by me and considered in my medical decision making (see chart for details).   Patient presented to the emergency department today because of concerns for aphasia and right-sided weakness.  On exam patient still has some aphasia here and question some right-sided weakness.  Her partially patient was outside of the window for  TPA.  CT angios was performed.  A slight delay given access problems.  However CTA does show acute stroke with M3 occlusions.  Discussed this finding with the patient.  Will plan on admission.  ____________________________________________   FINAL CLINICAL IMPRESSION(S) / ED DIAGNOSES  Final diagnoses:  Aphasia  Cerebrovascular accident (CVA), unspecified mechanism (HCC)     Note: This dictation was prepared with Dragon dictation. Any transcriptional errors that result from this process are unintentional     Phineas Semen, MD 07/12/18 2142

## 2018-07-12 NOTE — ED Notes (Addendum)
Pt pulled out IV officer at bedside. Pt very confused and mumbling he needs to urinate. Pt assisted up and ambulated to toilet with RN Butch, pt urinated on himself and the floor. Pt unable to clearly state any words. Pt assisted back into bed.

## 2018-07-12 NOTE — ED Notes (Signed)
MRI Miguel MilletMegan called and screened patient. Erie NoeVanessa RN gained 18g IV access

## 2018-07-12 NOTE — ED Notes (Signed)
Pt given meal tray and drink. Pt sitting up eating at this time. Pt is alert

## 2018-07-12 NOTE — ED Notes (Signed)
Called CT per Trey PaulaJeff they have someone on table at this time. Give 10 minutes then head that way

## 2018-07-12 NOTE — ED Notes (Signed)
Pt able to clearly state first and last name at this time. Pt now able to grip both hands. Pt still confused about where he is and unable to clearly state. Pt able to clearly say all words from stroke cards. Pt also able to describe what is going on in the stroke pictures. Pt seems more alert at this time.

## 2018-07-13 ENCOUNTER — Encounter (HOSPITAL_COMMUNITY): Payer: Self-pay

## 2018-07-13 ENCOUNTER — Other Ambulatory Visit: Payer: Self-pay

## 2018-07-13 ENCOUNTER — Inpatient Hospital Stay (HOSPITAL_COMMUNITY)
Admit: 2018-07-13 | Discharge: 2018-07-13 | Disposition: A | Discharge status: Home / Self Care | Attending: Internal Medicine | Admitting: Internal Medicine

## 2018-07-13 ENCOUNTER — Inpatient Hospital Stay

## 2018-07-13 DIAGNOSIS — I340 Nonrheumatic mitral (valve) insufficiency: Secondary | ICD-10-CM

## 2018-07-13 DIAGNOSIS — I639 Cerebral infarction, unspecified: Secondary | ICD-10-CM

## 2018-07-13 LAB — LIPID PANEL
CHOLESTEROL: 170 mg/dL (ref 0–200)
HDL: 41 mg/dL (ref >40)
LDL Cholesterol: 119 mg/dL — ABNORMAL HIGH (ref 0–99)
Total CHOL/HDL Ratio: 4.1 RATIO
Triglycerides: 49 mg/dL (ref <150)
VLDL: 10 mg/dL (ref 0–40)

## 2018-07-13 LAB — ECHOCARDIOGRAM COMPLETE
HEIGHTINCHES: 73 in
Weight: 2800 oz

## 2018-07-13 LAB — HEMOGLOBIN A1C
HEMOGLOBIN A1C: 6.2 % — AB (ref 4.8–5.6)
Mean Plasma Glucose: 131.24 mg/dL

## 2018-07-13 LAB — MRSA PCR SCREENING: MRSA by PCR: NEGATIVE

## 2018-07-13 MED ORDER — WARFARIN - PHARMACIST DOSING INPATIENT
Freq: Every day | Status: DC
  Administered 2018-07-13 – 2018-07-14 (×2)

## 2018-07-13 MED ORDER — ATORVASTATIN CALCIUM 20 MG PO TABS
40.0000 mg | ORAL_TABLET | Freq: Every day | ORAL | Status: DC
  Administered 2018-07-13 – 2018-07-14 (×2): 40 mg via ORAL
  Filled 2018-07-13 (×2): qty 2

## 2018-07-13 MED ORDER — WARFARIN SODIUM 7.5 MG PO TABS
7.5000 mg | ORAL_TABLET | Freq: Once | ORAL | Status: AC
  Administered 2018-07-13: 18:00:00 7.5 mg via ORAL
  Filled 2018-07-13: qty 1

## 2018-07-13 MED ORDER — ASPIRIN EC 325 MG PO TBEC
325.0000 mg | DELAYED_RELEASE_TABLET | Freq: Every day | ORAL | Status: DC
  Administered 2018-07-13: 11:00:00 325 mg via ORAL
  Filled 2018-07-13: qty 1

## 2018-07-13 NOTE — Evaluation (Signed)
Physical Therapy Evaluation Patient Details Name: Miguel [name] MRN: [id number] DOB: 12/23/1950 Today's Date: 07/13/2018   History of Present Illness  [facility]ed [facility] ER from prison [facility] due to acute onset of R-sided weakness, difficulty with speech; admitted with acute CVA.  MRI significant for L MCA infarct.  Clinical Impression  Upon evaluation, patient alert and oriented to self, location and situation; follows commands and participates well with session.  Mild conversational word-finding difficulty noted, but able to make needs/wants known during session.  Bilat UE/LE strength and ROM grossly symmetrical and WFL; no focal weakness, sensory or coordination deficits appreciated.  Able to complete bed mobility indep; sit/stand, basic transfers and gait (20' x2) without assist device, close sup/mod indep.  Maintains feet together, eyes open/closed, x30 seconds without difficulty; R SLS x15 seconds, L SLS x10 seconds.  Fair/good use of LE hip and stepping strategies for balance recovery.  Patient reports balance is at/near baseline; feels comfortable with current status. Would benefit from skilled PT to address above deficits and promote optimal return to PLOF; will maintain on caseload throughout remaining hospitalization to ensure continued mobility and to address higher-level balance deficits.  Anticipate no formal PT needs upon discharge.    Follow Up Recommendations No PT follow up(will maintain on caseload to ensure continued mobility and higher level balance skills; anticipate no formal PT needs upon discharge)    Equipment Recommendations       Recommendations for Other Services       Precautions / Restrictions Precautions Precautions: Fall Precaution Comments: prison representative at bedside to manage shackles, allow for participation wiht session Restrictions Weight Bearing Restrictions: No      Mobility  Bed Mobility Overal bed mobility: Modified Independent                 Transfers Overall transfer level: Modified independent Equipment used: None             General transfer comment: good LE strength/power with movement transition  Ambulation/Gait Ambulation/Gait assistance: Supervision Gait Distance (Feet): 20 Feet(x2) Assistive device: None       General Gait Details: forward, backward stepping and gait with turns in room environment, sup; overt buckling or LOB noted.  Patient feels at/near baseline  Stairs            Wheelchair Mobility    Modified Rankin (Stroke Patients Only)       Balance Overall balance assessment: Modified Independent   Sitting balance-Leahy Scale: Normal       Standing balance-Leahy Scale: Good   Single Leg Stance - Right Leg: 15 Single Leg Stance - Left Leg: 10     Rhomberg - Eyes Opened: 30 Rhomberg - Eyes Closed: 30                 Pertinent Vitals/Pain Pain Assessment: No/denies pain    Home Living Family/patient expects to be discharged to:: Dentention/Prison                      Prior Function Level of Independence: Independent         Comments: Indep with ADLs, household and community mobilization without assist device     Hand Dominance   Dominant Hand: Right    Extremity/Trunk Assessment   Upper Extremity Assessment Upper Extremity Assessment: Overall WFL for tasks assessed(grossly 4+/5 throughout; no focal weakness, sensory or coordination deficit appreciated)    Lower Extremity Assessment Lower Extremity Assessment: Overall WFL for tasks assessed(grossly 4+/5 throughout;  no focal weakness, sensory or coordination deficit appreciated)       Communication   Communication: No difficulties(mild conversational word-finding difficulties)  Cognition Arousal/Alertness: Awake/alert Behavior During Therapy: WFL for tasks assessed/performed                                   General Comments: mild confusion to date, difficulty with  recall/integration of new information at times      General Comments      Exercises     Assessment/Plan    PT Assessment Patient needs continued PT services  PT Problem List Decreased balance;Decreased mobility       PT Treatment Interventions DME instruction;Gait training;Functional mobility training;Therapeutic activities;Therapeutic exercise;Balance training;Patient/family education    PT Goals (Current goals can be found in the Care Plan section)  Acute Rehab PT Goals Patient Stated Goal: to move around PT Goal Formulation: With patient Time For Goal Achievement: 07/27/18 Potential to Achieve Goals: Good    Frequency Min 2X/week   Barriers to discharge        Co-evaluation               AM-PAC PT "6 Clicks" Daily Activity  Outcome Measure Difficulty turning over in bed (including adjusting bedclothes, sheets and blankets)?: None Difficulty moving from lying on back to sitting on the side of the bed? : None Difficulty sitting down on and standing up from a chair with arms (e.g., wheelchair, bedside commode, etc,.)?: None Help needed moving to and from a bed to chair (including a wheelchair)?: None Help needed walking in hospital room?: A Little Help needed climbing 3-5 steps with a railing? : A Little 6 Click Score: 22    End of Session Equipment Utilized During Treatment: Gait belt Activity Tolerance: Patient tolerated treatment well Patient left: in bed;with call bell/phone within reach(prison representative present at bedside throughout session; reapplied shackles end of session) Nurse Communication: Mobility status PT Visit Diagnosis: Difficulty in walking, not elsewhere classified (R26.2)    Time: 1610-9604 PT Time Calculation (min) (ACUTE ONLY): 11 min   Charges:   PT Evaluation $PT Eval Low Complexity: 1 Low         Kreig Parson H. Manson Passey, PT, DPT, NCS 07/13/18, 2:28 PM [phone]

## 2018-07-13 NOTE — Progress Notes (Addendum)
[facility] - [place] at [facility]   PATIENT NAME: Miguel [name]    MR#:  [id number]  DATE OF BIRTH:  October 03, 1950  SUBJECTIVE:  CHIEF COMPLAINT:  Pt is resting comfortably , denies headache and blurry vision, passed bed side swallow eval He came from [facility] prison  REVIEW OF SYSTEMS:  CONSTITUTIONAL: No fever, fatigue or weakness.  EYES: No blurred or double vision.  EARS, NOSE, AND THROAT: No tinnitus or ear pain.  RESPIRATORY: No cough, shortness of breath, wheezing or hemoptysis.  CARDIOVASCULAR: No chest pain, orthopnea, edema.  GASTROINTESTINAL: No nausea, vomiting, diarrhea or abdominal pain.  GENITOURINARY: No dysuria, hematuria.  ENDOCRINE: No polyuria, nocturia,  HEMATOLOGY: No anemia, easy bruising or bleeding SKIN: No rash or lesion. MUSCULOSKELETAL: No joint pain or arthritis.   NEUROLOGIC: No tingling, numbness, weakness.  PSYCHIATRY: No anxiety or depression.   DRUG ALLERGIES:  No Known Allergies  VITALS:  Blood pressure (!) 132/52, pulse 82, temperature 97.6 F (36.4 C), temperature source Oral, resp. rate 18, height 6\' 1"  (1.854 m), weight 79.4 kg, SpO2 100 %.  PHYSICAL EXAMINATION:  GENERAL:  67 y.o.-year-old patient lying in the bed with no acute distress.  EYES: Pupils equal, round, reactive to light and accommodation. No scleral icterus. Extraocular muscles intact.  HEENT: Head atraumatic, normocephalic. Oropharynx and nasopharynx clear.  NECK:  Supple, no jugular venous distention. No thyroid enlargement, no tenderness.  LUNGS: Normal breath sounds bilaterally, no wheezing, rales,rhonchi or crepitation. No use of accessory muscles of respiration.  CARDIOVASCULAR: S1, S2 normal. No murmurs, rubs, or gallops.  ABDOMEN: Soft, nontender, nondistended. Bowel sounds present. No organomegaly or mass.  EXTREMITIES: No pedal edema, cyanosis, or clubbing.  NEUROLOGIC: Cranial nerves II through XII are intact. Muscle strength  4/5 in rt upper and lower extremities.  Strength intact in left-sided extremities sensation intact. Gait not checked.  PSYCHIATRIC: The patient is alert and oriented x 3.  SKIN: No obvious rash, lesion, or ulcer.    LABORATORY PANEL:   CBC Recent Labs  Lab 07/12/18 1918  WBC 5.2  HGB 16.8  HCT 50.6  PLT 348   ------------------------------------------------------------------------------------------------------------------  Chemistries  Recent Labs  Lab 07/12/18 1918  NA 139  K 4.3  CL 101  CO2 29  GLUCOSE 84  BUN 19  CREATININE 1.05  CALCIUM 9.7  AST 20  ALT 17  ALKPHOS 57  BILITOT 0.6   ------------------------------------------------------------------------------------------------------------------  Cardiac Enzymes Recent Labs  Lab 07/12/18 1918  TROPONINI <0.03   ------------------------------------------------------------------------------------------------------------------  RADIOLOGY:  Ct Angio Head W Or Wo Contrast  Result Date: 07/12/2018 CLINICAL DATA:  67 year old male with loss of speech and right side weakness. EXAM: CT ANGIOGRAPHY HEAD AND NECK CT PERFUSION BRAIN TECHNIQUE: Multidetector CT imaging of the head and neck was performed using the standard protocol during bolus administration of intravenous contrast. Multiplanar CT image reconstructions and MIPs were obtained to evaluate the vascular anatomy. Carotid stenosis measurements (when applicable) are obtained utilizing NASCET criteria, using the distal internal carotid diameter as the denominator. Multiphase CT imaging of the brain was performed following IV bolus contrast injection. Subsequent parametric perfusion maps were calculated using RAPID software. CONTRAST:  ISOVUE-370 IOPAMIDOL (ISOVUE-370) INJECTION 76% COMPARISON:  Noncontrast head CT 1853 hours today. FINDINGS: CT Brain Perfusion Findings: CBF (<30%) Volume: 9mL Perfusion (Tmax>6.0s) volume: 53mL Mismatch Volume: 44mL Infarction  Location:Left anterior MCA, inferior frontal operculum. CTA NECK Skeleton: Advanced degenerative disease in the cervical spine. Carious dentition. No acute osseous abnormality identified.  Upper chest: Centrilobular emphysema. Otherwise negative. Other neck: Negative. Aortic arch: 3 vessel arch configuration. Soft and calcified arch atherosclerosis with no great vessel origin stenosis. Right carotid system: Negative. Left carotid system: Intermittent soft plaque in the left CCA without stenosis. The left carotid bifurcation appears normal. No cervical left ICA plaque or stenosis. Vertebral arteries: Proximal right subclavian artery and right vertebral artery origin are normal. The right vertebral artery is patent with tortuosity but no stenosis to the skull base. No proximal left subclavian artery stenosis despite some plaque. Normal left vertebral artery origin. Patent left vertebral artery to the skull base. CTA HEAD Posterior circulation: Patent distal vertebral arteries with tortuosity but no stenosis. Patent vertebrobasilar junction and basilar artery without stenosis. Patent SCA and PCA origins. Left posterior communicating artery is present with a fetal type left PCA origin. The right posterior communicating is diminutive or absent. There is mild to moderate irregularity and stenosis of the non dominant left P1, but otherwise the bilateral PCA branches are normal. Anterior circulation: Both ICA siphons are patent. Mild siphon stenosis appears greater on the right. No stenosis. Normal ophthalmic and left posterior communicating artery origins. Patent carotid termini. Patent MCA and ACA origins. Anterior communicating artery and bilateral ACA branches are within normal limits. Right MCA M1 segment, bifurcation, and right MCA branches are within normal limits. The left M1 and left MCA trifurcation are patent without stenosis. There are 3 different right M3 branch occlusions identified on series 13, images 33 and  34, and also annotated on the thin sagittal images series 10. There is no M2 branch occlusion. Venous sinuses: Patent. Anatomic variants: Fetal type left PCA origin. Delayed phase: Possible cortical hypodensity in the left inferior frontal gyrus now on series 14, image 11, but no other CT changes of acute cortical infarct identified. No intracranial hemorrhage or mass effect. No abnormal enhancement identified. Review of the MIP images confirms the above findings IMPRESSION: 1. CT perfusion detects an acute Left MCA territory infarct - anterior division core infarct of 9 mL - penumbra volume in the anterior and posterior divisions of 53 mL. 2. However, CTA does NOT reveal large vessel occlusion. Rather there are three different Left MCA M3 branch occlusions identified. 3. The above was discussed by telephone with Dr. Phineas Semen on 07/12/2018 at 2114 hours. 4. Carotid atherosclerosis in the head and neck without significant stenosis. 5. Fetal type Left PCA origin with mild to moderate irregularity and stenosis in the left P1, but might not be consequential due to the normal left Pcomm supply. Electronically Signed   By: Odessa Fleming M.D.   On: 07/12/2018 21:25   Ct Angio Neck W Or Wo Contrast  Result Date: 07/12/2018 CLINICAL DATA:  67 year old male with loss of speech and right side weakness. EXAM: CT ANGIOGRAPHY HEAD AND NECK CT PERFUSION BRAIN TECHNIQUE: Multidetector CT imaging of the head and neck was performed using the standard protocol during bolus administration of intravenous contrast. Multiplanar CT image reconstructions and MIPs were obtained to evaluate the vascular anatomy. Carotid stenosis measurements (when applicable) are obtained utilizing NASCET criteria, using the distal internal carotid diameter as the denominator. Multiphase CT imaging of the brain was performed following IV bolus contrast injection. Subsequent parametric perfusion maps were calculated using RAPID software. CONTRAST:   ISOVUE-370 IOPAMIDOL (ISOVUE-370) INJECTION 76% COMPARISON:  Noncontrast head CT 1853 hours today. FINDINGS: CT Brain Perfusion Findings: CBF (<30%) Volume: 9mL Perfusion (Tmax>6.0s) volume: 53mL Mismatch Volume: 44mL Infarction Location:Left anterior MCA, inferior  frontal operculum. CTA NECK Skeleton: Advanced degenerative disease in the cervical spine. Carious dentition. No acute osseous abnormality identified. Upper chest: Centrilobular emphysema. Otherwise negative. Other neck: Negative. Aortic arch: 3 vessel arch configuration. Soft and calcified arch atherosclerosis with no great vessel origin stenosis. Right carotid system: Negative. Left carotid system: Intermittent soft plaque in the left CCA without stenosis. The left carotid bifurcation appears normal. No cervical left ICA plaque or stenosis. Vertebral arteries: Proximal right subclavian artery and right vertebral artery origin are normal. The right vertebral artery is patent with tortuosity but no stenosis to the skull base. No proximal left subclavian artery stenosis despite some plaque. Normal left vertebral artery origin. Patent left vertebral artery to the skull base. CTA HEAD Posterior circulation: Patent distal vertebral arteries with tortuosity but no stenosis. Patent vertebrobasilar junction and basilar artery without stenosis. Patent SCA and PCA origins. Left posterior communicating artery is present with a fetal type left PCA origin. The right posterior communicating is diminutive or absent. There is mild to moderate irregularity and stenosis of the non dominant left P1, but otherwise the bilateral PCA branches are normal. Anterior circulation: Both ICA siphons are patent. Mild siphon stenosis appears greater on the right. No stenosis. Normal ophthalmic and left posterior communicating artery origins. Patent carotid termini. Patent MCA and ACA origins. Anterior communicating artery and bilateral ACA branches are within normal limits. Right MCA  M1 segment, bifurcation, and right MCA branches are within normal limits. The left M1 and left MCA trifurcation are patent without stenosis. There are 3 different right M3 branch occlusions identified on series 13, images 33 and 34, and also annotated on the thin sagittal images series 10. There is no M2 branch occlusion. Venous sinuses: Patent. Anatomic variants: Fetal type left PCA origin. Delayed phase: Possible cortical hypodensity in the left inferior frontal gyrus now on series 14, image 11, but no other CT changes of acute cortical infarct identified. No intracranial hemorrhage or mass effect. No abnormal enhancement identified. Review of the MIP images confirms the above findings IMPRESSION: 1. CT perfusion detects an acute Left MCA territory infarct - anterior division core infarct of 9 mL - penumbra volume in the anterior and posterior divisions of 53 mL. 2. However, CTA does NOT reveal large vessel occlusion. Rather there are three different Left MCA M3 branch occlusions identified. 3. The above was discussed by telephone with Dr. Phineas Semen on 07/12/2018 at 2114 hours. 4. Carotid atherosclerosis in the head and neck without significant stenosis. 5. Fetal type Left PCA origin with mild to moderate irregularity and stenosis in the left P1, but might not be consequential due to the normal left Pcomm supply. Electronically Signed   By: Odessa Fleming M.D.   On: 07/12/2018 21:25   Mr Brain Wo Contrast  Result Date: 07/13/2018 CLINICAL DATA:  Right-sided weakness and speech disturbance. EXAM: MRI HEAD WITHOUT CONTRAST TECHNIQUE: Multiplanar, multiecho pulse sequences of the brain and surrounding structures were obtained without intravenous contrast. COMPARISON:  Head CT and CTA 07/12/2018 FINDINGS: Brain: There is a small to moderate-sized acute left MCA territory infarct primarily involving the frontal operculum and anterior insula. There is mild involvement of the posterior left insula and inferolateral  left parietal lobe as well. No intracranial hemorrhage, mass, midline shift, or extra-axial fluid collection is identified. The ventricles and sulci are normal for age. Scattered small foci of T2 hyperintensity in the cerebral white matter bilaterally are nonspecific but compatible with mild chronic small vessel ischemic disease. Vascular: Major  intracranial arterial flow voids are preserved. Lack of a flow void in the right sigmoid sinus and right jugular bulb likely reflects slow flow given normal appearance on yesterday's CTA. Skull and upper cervical spine: Unremarkable bone marrow signal. Sinuses/Orbits: Unremarkable orbits. Paranasal sinuses and mastoid air cells are clear. Other: None. IMPRESSION: 1. Acute nonhemorrhagic left MCA infarct. 2. Mild chronic small vessel ischemic disease. Electronically Signed   By: Sebastian Ache M.D.   On: 07/13/2018 10:54   Ct Cerebral Perfusion W Contrast  Result Date: 07/12/2018 CLINICAL DATA:  67 year old male with loss of speech and right side weakness. EXAM: CT ANGIOGRAPHY HEAD AND NECK CT PERFUSION BRAIN TECHNIQUE: Multidetector CT imaging of the head and neck was performed using the standard protocol during bolus administration of intravenous contrast. Multiplanar CT image reconstructions and MIPs were obtained to evaluate the vascular anatomy. Carotid stenosis measurements (when applicable) are obtained utilizing NASCET criteria, using the distal internal carotid diameter as the denominator. Multiphase CT imaging of the brain was performed following IV bolus contrast injection. Subsequent parametric perfusion maps were calculated using RAPID software. CONTRAST:  ISOVUE-370 IOPAMIDOL (ISOVUE-370) INJECTION 76% COMPARISON:  Noncontrast head CT 1853 hours today. FINDINGS: CT Brain Perfusion Findings: CBF (<30%) Volume: 9mL Perfusion (Tmax>6.0s) volume: 53mL Mismatch Volume: 44mL Infarction Location:Left anterior MCA, inferior frontal operculum. CTA NECK Skeleton:  Advanced degenerative disease in the cervical spine. Carious dentition. No acute osseous abnormality identified. Upper chest: Centrilobular emphysema. Otherwise negative. Other neck: Negative. Aortic arch: 3 vessel arch configuration. Soft and calcified arch atherosclerosis with no great vessel origin stenosis. Right carotid system: Negative. Left carotid system: Intermittent soft plaque in the left CCA without stenosis. The left carotid bifurcation appears normal. No cervical left ICA plaque or stenosis. Vertebral arteries: Proximal right subclavian artery and right vertebral artery origin are normal. The right vertebral artery is patent with tortuosity but no stenosis to the skull base. No proximal left subclavian artery stenosis despite some plaque. Normal left vertebral artery origin. Patent left vertebral artery to the skull base. CTA HEAD Posterior circulation: Patent distal vertebral arteries with tortuosity but no stenosis. Patent vertebrobasilar junction and basilar artery without stenosis. Patent SCA and PCA origins. Left posterior communicating artery is present with a fetal type left PCA origin. The right posterior communicating is diminutive or absent. There is mild to moderate irregularity and stenosis of the non dominant left P1, but otherwise the bilateral PCA branches are normal. Anterior circulation: Both ICA siphons are patent. Mild siphon stenosis appears greater on the right. No stenosis. Normal ophthalmic and left posterior communicating artery origins. Patent carotid termini. Patent MCA and ACA origins. Anterior communicating artery and bilateral ACA branches are within normal limits. Right MCA M1 segment, bifurcation, and right MCA branches are within normal limits. The left M1 and left MCA trifurcation are patent without stenosis. There are 3 different right M3 branch occlusions identified on series 13, images 33 and 34, and also annotated on the thin sagittal images series 10. There is no  M2 branch occlusion. Venous sinuses: Patent. Anatomic variants: Fetal type left PCA origin. Delayed phase: Possible cortical hypodensity in the left inferior frontal gyrus now on series 14, image 11, but no other CT changes of acute cortical infarct identified. No intracranial hemorrhage or mass effect. No abnormal enhancement identified. Review of the MIP images confirms the above findings IMPRESSION: 1. CT perfusion detects an acute Left MCA territory infarct - anterior division core infarct of 9 mL - penumbra volume in the  anterior and posterior divisions of 53 mL. 2. However, CTA does NOT reveal large vessel occlusion. Rather there are three different Left MCA M3 branch occlusions identified. 3. The above was discussed by telephone with Dr. Phineas SemenGRAYDON GOODMAN on 07/12/2018 at 2114 hours. 4. Carotid atherosclerosis in the head and neck without significant stenosis. 5. Fetal type Left PCA origin with mild to moderate irregularity and stenosis in the left P1, but might not be consequential due to the normal left Pcomm supply. Electronically Signed   By: Odessa FlemingH  Hall M.D.   On: 07/12/2018 21:25   Ct Head Code Stroke Wo Contrast`  Result Date: 07/12/2018 CLINICAL DATA:  Code stroke. 67 year old male with right side weakness, loss of speech. By report, last seen normal at noon today. EXAM: CT HEAD WITHOUT CONTRAST TECHNIQUE: Contiguous axial images were obtained from the base of the skull through the vertex without intravenous contrast. COMPARISON:  Report of noncontrast head CT 06/08/1998 (no images available). FINDINGS: Brain: No midline shift, ventriculomegaly, mass effect, evidence of mass lesion, intracranial hemorrhage or evidence of cortically based acute infarction. Gray-white matter differentiation is within normal limits throughout the brain. Vascular: Mild Calcified atherosclerosis at the skull base. No suspicious intracranial vascular hyperdensity. Skull: Negative. Sinuses/Orbits: Visualized paranasal sinuses  and mastoids are well pneumatized. Other: Orbit and scalp soft tissues are within normal limits. ASPECTS [facility]([place] Stroke Program Early CT Score) - Ganglionic level infarction (caudate, lentiform nuclei, internal capsule, insula, M1-M3 cortex): 7 - Supraganglionic infarction (M4-M6 cortex): 3 Total score (0-10 with 10 being normal): 10 IMPRESSION: 1. Normal noncontrast CT appearance of the brain. 2. ASPECTS is 10. 3. Study discussed by telephone with Dr. Derrill KayGoodman in the ED on 07/12/2018 at 19:14 . We discussed that a CTA - CT Perfusion was planned but that two IV accesses have failed. So I recommended stat MRI brain diffusion imaging and time-of-flight circle-of-Willis MRA instead. Electronically Signed   By: Odessa FlemingH  Hall M.D.   On: 07/12/2018 19:16    EKG:   Orders placed or performed during the hospital encounter of 07/12/18  . ED EKG  . ED EKG  . EKG 12-Lead  . EKG 12-Lead    ASSESSMENT AND PLAN:   Acute nonhemorrhagic left MCA stroke Clinically stable Seen by neurology Dr. Lucia GaskinsAhern, appreciate neurology recommendations CT angiogram of the head and neck has revealed left MCA infarct MRI of the brain-acute nonhemorrhagic left MCA infarct Echocardiogram with ejection fraction 25 to 30%, high risk for left ventricular mural thrombus given large lesions of severe hypokinesis LDL 119, start patient on high intensity statin Hemoglobin A1c pending PT OT evaluation-no formal PT needs identified Patient has passed bedside swallow evaluation neurology has recommended TEE, discussed with on-call cardiology Dr. Mariah MillingGollan who is recommending to start the patient on Coumadin and outpatient follow-up in 3 to 4 weeks.  No need of TEE from cardiology standpoint Neurology is agreeable to start the patient on Coumadin tonight and discontinue aspirin Consult case management/social worker regarding Coumadin arrangement and follow-up blood work as the patient is from [facility]  Hyperlipidemia - statin  tobaco abuse  disorder - counselled for 5 min , refused nicotine patch  Schizophrenia (HCC) -patient receives Abilify injection every 30 days, he does not need it at this time.   All the records are reviewed and case discussed with Care Management/Social Workerr. Management plans discussed with the patient, family and they are in agreement.  CODE STATUS: FC  TOTAL TIME TAKING CARE OF THIS PATIENT: 35 minutes.  POSSIBLE D/C IN 1-2 DAYS, DEPENDING ON CLINICAL CONDITION.  Note: This dictation was prepared with Dragon dictation along with smaller phrase technology. Any transcriptional errors that result from this process are unintentional.   Ramonita Lab M.D on 07/13/2018 at 2:32 PM  Between 7am to 6pm - Pager - [phone] After 6pm go to [email or web address] - password [email or web address]  [facility]  Office  [phone]  CC: Primary care physician; Patient, No Pcp Per

## 2018-07-13 NOTE — Progress Notes (Signed)
ANTICOAGULATION CONSULT NOTE  Pharmacy Consult for warfarin Indication: stroke  No Known Allergies  Patient Measurements: Height: 6\' 1"  (185.4 cm) Weight: 175 lb (79.4 kg) IBW/kg (Calculated) : 79.9   Vital Signs: Temp: 97.6 F (36.4 C) (11/17 0820) Temp Source: Oral (11/17 0820) BP: 132/52 (11/17 1110) Pulse Rate: 82 (11/17 1110)  Labs: Recent Labs    07/12/18 1918  HGB 16.8  HCT 50.6  PLT 348  APTT 34  LABPROT 13.2  INR 1.01  CREATININE 1.05  TROPONINI <0.03    Estimated Creatinine Clearance: 78.8 mL/min (by C-G formula based on SCr of 1.05 mg/dL).   Medical History: Past Medical History:  Diagnosis Date  . Schizophrenia [facility](HCC)     Assessment: 67 year old male s/p left MCA stroke. No anticoagulation PTA. Patient high risk for left ventricular mural thrombus. Cardiology recommending warfarin and f/u in 3-4 weeks. Neurology in agreement and discontinued aspirin.  Confirmed with Dr. Amado CoeGouru that Cardiology did not wish to bridge the patient until INR therapeutic. Pharmacy to start warfarin tonight.  Goal of Therapy:  INR 2-3 Monitor platelets by anticoagulation protocol: Yes   Plan:  Baseline INR 1.01. Will give warfarin 7.5 mg tonight.  INR with morning labs. Pharmacy will continue to monitor INR daily while patient remains in hospital. When patient is discharged, he will need close follow up, especially until INR therapeutic.  Pricilla RiffleAbby K , PharmD Pharmacy Resident  07/13/2018 3:17 PM

## 2018-07-13 NOTE — Progress Notes (Signed)
Consult placed for transesophageal echo Indication, recent stroke, and for embolism  Transthoracic echo with severely reduced ejection fraction,  large regions of severe hypokinesis, likely akinesis Wall motion abnormalities consistent with underlying coronary disease, prior large MI  Previously seen by Dr. Juliann Paresallwood 2016 Cardiac work-up ordered at that time Does not appear that he followed up with any of those tests  Given high risk of mural thrombus in regions of akinesis, recent stroke,  Even if transesophageal echo shows no thrombus in the left atrium/LV, he is at risk of chronic thromboemboli  Would consider starting warfarin [facility] (where he was seen in the past), for ischemic work-up  Signed, Dossie Arbourim Gollan, MD, Ph.D [facility]

## 2018-07-13 NOTE — Consult Note (Signed)
STROKE TEAM PROGRESS NOTE   SUBJECTIVE (INTERVAL HISTORY) His guarding officer is at bedside. Patient from detention center, incarcerated since 10/26, PMHx schizophrenia, non-compliance, polysubstance abuse. Came to [place] due to right arm weakness. Non compliance not on medications outpatient, denies taking any medications. Symptoms improved, clear speech this morning but oriented only to name and month does not know the year, covers over his head. No TPA given.    OBJECTIVE Vitals:   07/12/18 2252 07/12/18 2253 07/13/18 0209 07/13/18 0820  BP: (!) 125/59  114/72 127/72  Pulse: 62 62 65 61  Resp: 20  18 18   Temp:   (!) 97.4 F (36.3 C) 97.6 F (36.4 C)  TempSrc:   Oral Oral  SpO2: 99% 99% 100% 100%  Weight:      Height:        CBC:  Recent Labs  Lab 07/12/18 1918  WBC 5.2  NEUTROABS 3.0  HGB 16.8  HCT 50.6  MCV 95.8  PLT 348    Basic Metabolic Panel:  Recent Labs  Lab 07/12/18 1918  NA 139  K 4.3  CL 101  CO2 29  GLUCOSE 84  BUN 19  CREATININE 1.05  CALCIUM 9.7    Lipid Panel:     Component Value Date/Time   CHOL 170 07/13/2018 0425   TRIG 49 07/13/2018 0425   HDL 41 07/13/2018 0425   CHOLHDL 4.1 07/13/2018 0425   VLDL 10 07/13/2018 0425   LDLCALC 119 (H) 07/13/2018 0425   HgbA1c: No results found for: HGBA1C Urine Drug Screen:     Component Value Date/Time   LABOPIA NONE DETECTED 07/12/2018 1918   COCAINSCRNUR NONE DETECTED 07/12/2018 1918   LABBENZ NONE DETECTED 07/12/2018 1918   AMPHETMU NONE DETECTED 07/12/2018 1918   THCU NONE DETECTED 07/12/2018 1918   LABBARB NONE DETECTED 07/12/2018 1918    Alcohol Level     Component Value Date/Time   ETH <10 07/12/2018 1918    IMAGING  Results for orders placed or performed during the hospital encounter of 07/12/18  MR BRAIN WO CONTRAST   Narrative   CLINICAL DATA:  Right-sided weakness and speech disturbance.  EXAM: MRI HEAD WITHOUT CONTRAST  TECHNIQUE: Multiplanar, multiecho pulse  sequences of the brain and surrounding structures were obtained without intravenous contrast.  COMPARISON:  Head CT and CTA 07/12/2018  FINDINGS: Brain: There is a small to moderate-sized acute left MCA territory infarct primarily involving the frontal operculum and anterior insula. There is mild involvement of the posterior left insula and inferolateral left parietal lobe as well. No intracranial hemorrhage, mass, midline shift, or extra-axial fluid collection is identified. The ventricles and sulci are normal for age. Scattered small foci of T2 hyperintensity in the cerebral white matter bilaterally are nonspecific but compatible with mild chronic small vessel ischemic disease.  Vascular: Major intracranial arterial flow voids are preserved. Lack of a flow void in the right sigmoid sinus and right jugular bulb likely reflects slow flow given normal appearance on yesterday's CTA.  Skull and upper cervical spine: Unremarkable bone marrow signal.  Sinuses/Orbits: Unremarkable orbits. Paranasal sinuses and mastoid air cells are clear.  Other: None.  IMPRESSION: 1. Acute nonhemorrhagic left MCA infarct. 2. Mild chronic small vessel ischemic disease.   Electronically Signed   By: Sebastian AcheAllen  Grady M.D.   On: 07/13/2018 10:54        PHYSICAL EXAM Eyes: Conjunctivae are normal.  ENT   Head: Normocephalic and atraumatic.   Nose: No congestion/rhinnorhea.   Mouth/Throat:  Mucous membranes are moist.   Neck: No stridor. Hematological/Lymphatic/Immunilogical: No cervical lymphadenopathy. Cardiovascular: Normal rate, regular rhythm.  No murmurs, rubs, or gallops.  Respiratory: Normal respiratory effort without tachypnea nor retractions. Breath sounds are clear and equal bilaterally. No wheezes/rales/rhonchi. Gastrointestinal: Soft and non tender. No rebound. No guarding.  Genitourinary: Deferred Musculoskeletal: Normal range of motion in all extremities. No lower  extremity edema.  Neuro: Detailed Neurologic Exam  Speech:    No aphasia or dysarthria Cognition:    The patient is oriented to person, month "11th month" but not year.  Cranial Nerves:    The pupils are small but equal, round, and reactive to light. Attempted fundoscopy could not visualize due to non cooperation. Visual fields are full to finger confrontation. Extraocular movements are intact. Trigeminal sensation is intact and the muscles of mastication are normal. The face is symmetric. The palate elevates in the midline. Hearing intact. Voice is normal. Shoulder shrug is normal. The tongue has normal motion without fasciculations.   Coordination:    No dysmetria noted.   Gait:    Gait deferred due to medical condition  Motor Observation:    No asymmetry, no atrophy, and no involuntary movements noted. Tone:    Normal muscle tone.    Posture:    Posture is normal. normal erect    Strength:    Mild right arm weakness, antigravity without drift. Strength is V/V in the left upper and lower limbs.      Sensation: intact to LT     Reflex Exam:  DTR's:    Deep tendon reflexes in the upper and lower extremities are hyporeflexic bilaterally.   Toes:    The toes are equiv bilaterally.   Clonus:    Clonus is absent.    ASSESSMENT/PLAN  His guarding officer is at bedside. Patient from detention center, incarcerated since 10/26, PMHx schizophrenia, non-compliance, polysubstance abuse. Came to [place] due to right arm weakness.  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  Likely atherosclerosis but given echo with possible thrombus needs cardiology consult and TEE due to possibility of cardioembolic etiology.   Resultant  Mild right-sided weakness  CT head normal  CTA head/neck: multiple left M3 occusions  MRI brain: left mca stroke  2D Echo: high risk of LV mural thrombus given large regions of severe hypokinesis (possibly  akinesis).  LDL 119  HgbA1c pending  No antithrombotics prior to admission now on ASA 325mg   Patient counseled to be compliant with his antithrombotic medications  Ongoing aggressive stroke risk factor management  Therapy recommendations:  pending  Disposition:  pending  Hypertension .  Permissive hypertension (OK if < 220/120) but gradually normalize in 5-7 days .  Long-term BP goal normotensive  Hyperlipidemia  LDL 119, goal < 70  Add Lipitor  Continue statin at discharge  Diabetes type II  HgbA1c pending, goal < 7.0  Other Active Problems - given echo with possible thrombus needs cardiology consult and TEE due to possibility of cardioembolic etiology.  Hospital day # 1  Personally examined patient and images, and have participated in and made any corrections needed to history, physical, neuro exam,assessment and plan as stated above.  I have personally obtained the history, evaluated lab date, reviewed imaging studies and agree with radiology interpretations.    Naomie Dean, MD Neurology   To contact Stroke Continuity provider, please refer to [email or web address]. After hours, contact General Neurology

## 2018-07-14 LAB — PROTIME-INR
INR: 0.96
Prothrombin Time: 12.7 seconds (ref 11.4–15.2)

## 2018-07-14 LAB — HEMOGLOBIN A1C
Hgb A1c MFr Bld: 5.8 % — ABNORMAL HIGH (ref 4.8–5.6)
Mean Plasma Glucose: 119.76 mg/dL

## 2018-07-14 LAB — GLUCOSE, CAPILLARY: GLUCOSE-CAPILLARY: 72 mg/dL (ref 70–99)

## 2018-07-14 LAB — HIV ANTIBODY (ROUTINE TESTING W REFLEX): HIV SCREEN 4TH GENERATION: NONREACTIVE

## 2018-07-14 MED ORDER — WARFARIN SODIUM 7.5 MG PO TABS
7.5000 mg | ORAL_TABLET | Freq: Once | ORAL | Status: AC
  Administered 2018-07-14: 17:00:00 7.5 mg via ORAL
  Filled 2018-07-14: qty 1

## 2018-07-14 MED ORDER — ENALAPRIL MALEATE 2.5 MG PO TABS
2.5000 mg | ORAL_TABLET | Freq: Every day | ORAL | Status: DC
  Administered 2018-07-14 – 2018-07-15 (×2): 2.5 mg via ORAL
  Filled 2018-07-14 (×2): qty 1

## 2018-07-14 NOTE — Evaluation (Signed)
Occupational Therapy Evaluation Patient Details Name: Miguel HippoDonald R [name] MRN: [id number] DOB: 08/29/1950 Today's Date: 07/14/2018    History of Present Illness presented to ER from prison system due to acute onset of R-sided weakness, difficulty with speech; admitted with acute CVA.  MRI significant for L MCA infarct.   Clinical Impression   Pt seen for OT evaluation this date. Prior to hospital admission, pt was independent with all ADL/IADL tasks and mobility.  Currently pt able to walk unassisted to bathroom and is independent with grooming, toileting, dressing and eating tasks.  Pt has some mild difficulties with expressive speech/word recall but was able to answer questions. Pt has no sensation/coordination difficulties.  No further skilled OT services are required at this time. Will sign off.      Follow Up Recommendations  No OT follow up    Equipment Recommendations  None recommended by OT    Recommendations for Other Services       Precautions / Restrictions Precautions Precautions: Fall Precaution Comments: prison representative at bedside to manage shackles, allow for participation with session Restrictions Weight Bearing Restrictions: No      Mobility Bed Mobility Overal bed mobility: Independent                Transfers Overall transfer level: Independent Equipment used: None             General transfer comment: good LE strength/power with movement transition    Balance Overall balance assessment: Independent   Sitting balance-Leahy Scale: Normal       Standing balance-Leahy Scale: Good                             ADL either performed or assessed with clinical judgement   ADL Overall ADL's : Modified independent                                       General ADL Comments: extra time; no RW required     Vision Baseline Vision/History: Wears glasses Wears Glasses: At all times Patient Visual Report: No  change from baseline(pt stated no blurry or double vision)       Perception     Praxis      Pertinent Vitals/Pain Pain Assessment: No/denies pain     Hand Dominance Right   Extremity/Trunk Assessment Upper Extremity Assessment Upper Extremity Assessment: Overall WFL for tasks assessed(BUE grossly 4/5)   Lower Extremity Assessment Lower Extremity Assessment: Defer to PT evaluation;Overall WFL for tasks assessed(BLE grossly 4+/5)       Communication Communication Communication: Expressive difficulties   Cognition Arousal/Alertness: Awake/alert Behavior During Therapy: WFL for tasks assessed/performed Overall Cognitive Status: Within Functional Limits for tasks assessed                                 General Comments: lethargic   General Comments       Exercises     Shoulder Instructions      Home Living Family/patient expects to be discharged to:: Dentention/Prison                                        Prior Functioning/Environment Level of Independence: Independent  Comments: Indep with ADLs, household and community mobilization without assist device        OT Problem List: Decreased strength      OT Treatment/Interventions:      OT Goals(Current goals can be found in the care plan section) Acute Rehab OT Goals Patient Stated Goal: to move around OT Goal Formulation: All assessment and education complete, DC therapy  OT Frequency:     Barriers to D/C:            Co-evaluation              AM-PAC PT "6 Clicks" Daily Activity     Outcome Measure Help from another person eating meals?: None Help from another person taking care of personal grooming?: None Help from another person toileting, which includes using toliet, bedpan, or urinal?: None Help from another person bathing (including washing, rinsing, drying)?: None Help from another person to put on and taking off regular upper body clothing?:  None Help from another person to put on and taking off regular lower body clothing?: None 6 Click Score: 24   End of Session Equipment Utilized During Treatment: Gait belt;Rolling walker  Activity Tolerance: Patient tolerated treatment well Patient left: in bed;with call bell/phone within reach;with nursing/sitter in room(prison attendent in room )  OT Visit Diagnosis: Muscle weakness (generalized) (M62.81)                Time: 1610-9604 OT Time Calculation (min): 14 min Charges:     Gypsy Balsam OTS  07/14/2018, 10:21 AM

## 2018-07-14 NOTE — Care Management (Signed)
[name] [phone]). States that [name] will be retuning to the jail when stable. States that she will talk to [employer] PA and they could look at the INR levels as needed. Will fax updated notes to the jail [phone] Miguel GreetBrenda S Kiernan Atkerson RN MSN CCM Care Management [phone]

## 2018-07-14 NOTE — Progress Notes (Signed)
[place] Patient testing comfortably in bed with deputy at bedside.  Denies pain.  Orson Apeanielle Krystelle Prashad, RN, BSN

## 2018-07-14 NOTE — Evaluation (Signed)
Speech Language Pathology Evaluation Patient Details Name: Miguel [name] MRN: [id number] DOB: Oct 28, 1950 Today's Date: 07/14/2018 Time: 4098-1191 SLP Time Calculation (min) (ACUTE ONLY): 60 min  Problem List:  Patient Active Problem List   Diagnosis Date Noted  . Stroke (HCC) 07/12/2018  . Schizophrenia (HCC) 04/11/2016  . Noncompliance 04/11/2016   Past Medical History:  Past Medical History:  Diagnosis Date  . Schizophrenia [facility])    Past Surgical History:  Past Surgical History:  Procedure Laterality Date  . APPENDECTOMY     HPI:  Pt is a 67 y.o. male w/ a PMH of Seizure and Schizophrenia per chart notes who presents to the ED from prison for right-sided weakness.  He states that this occurred in the afternoon around 5:00.  But he states that it had occurred prior to that and then gone away, and then recurred at 5:00 again.  His last known normal was before noon.  He was noted to have some dysarthria, and also has some expressive deficits with some intermittent word salad.  Per MRI, pt has a L MCA; a small to moderate-sized acute left MCA territory infarct primarily involving the frontal operculum and anterior insula.  Pt is verbally conversive in room. He is missing many dentition which can impact articulation of speech.    Assessment / Plan / Recommendation Clinical Impression  Pt appears to present w/ adequate Cognitive-linguistic abilities during general conversation w/ no gross Receptive/Expressive aphasia noted. Pt was noted to exhibit a mild hesitation in recall of a concept (cereal in a box that required peeling back the lid to pour milk in) but given encouragement to take his time and a verbal cue, he was able to recall the concept he was describing. Pt also exhibited slightly decreased articulation of few speech sounds in connected speech - pt is missing many dentition. W/ strategies of slowing down and increasing effort in his articulation, he was 100% intelligible.  Unsure of pt's baseline communication skills as he tended to speak more softly and is missing dentition which can decrease articulation of speech somewhat. Pt read from the Menu provided and picked items for both Dinner and Breakfast meals. He was able to name and recall items of drinks (juices) he wanted SLP to bring him. He followed instructions in positioning himself upright in bed and verbalized the benefit from sitting up to eat/drink (not choke). He answered basic questions re: what brought him to the hospital. No gross unilateral weakness noted during OM exam. Pt was alert/oriented x4. Pt does have a baseline of Seizure and Schizophrenia. Discussed w/ pt the need to utilize general strategies of slowing down, overarticulation, and increasing volume in order to improve articulation of speech and aid recall(slowing down when talking). Pt verbalized agreement and was able to demonstrate. ST services will f/u w/ further education while admitted. Recommend further skilled f/u post discharge if pt is noted to demonstrate deficits in communication during ADLs or anything different from his normal baseline during engagement w/ others.     SLP Assessment  SLP Recommendation/Assessment: Patient does not need any further Speech Lanaguage Pathology Services SLP Visit Diagnosis: Dysarthria and anarthria (R47.1)(missing dentition)    Follow Up Recommendations  None(at this time)    Frequency and Duration  n/a        SLP Evaluation Cognition  Overall Cognitive Status: (unknown - Schizophrenia baseline) Arousal/Alertness: Awake/alert Orientation Level: Oriented X4 Attention: Focused;Sustained Focused Attention: Appears intact Sustained Attention: Appears intact Memory: Appears intact(for what happened; the morning  events) Awareness: Appears intact Problem Solving: Appears intact(basic) Behaviors: (none) Safety/Judgment: Appears intact(basic)       Comprehension  Auditory Comprehension Overall  Auditory Comprehension: Appears within functional limits for tasks assessed Yes/No Questions: Within Functional Limits Commands: Within Functional Limits Conversation: Simple Interfering Components: (none) Visual Recognition/Discrimination Discrimination: Not tested Reading Comprehension Reading Status: Within funtional limits(menu reading to pick dinner items)    Expression Expression Primary Mode of Expression: Verbal Verbal Expression Overall Verbal Expression: Appears within functional limits for tasks assessed(grossly) Initiation: (grossly WFL; slight hesitation during concept recall x1) Automatic Speech: Name;Social Response;Counting;Day of week Level of Generative/Spontaneous Verbalization: Word;Phrase;Sentence Repetition: No impairment Naming: No impairment(general ) Pragmatics: No impairment Interfering Components: (n/a) Non-Verbal Means of Communication: Not applicable Other Verbal Expression Comments: min decreased articulation of few speech sounds - pt is missing many dentition Written Expression Dominant Hand: Right Written Expression: Not tested   Oral / Motor  Oral Motor/Sensory Function Overall Oral Motor/Sensory Function: Within functional limits Motor Speech Overall Motor Speech: Appears within functional limits for tasks assessed(grossly) Respiration: Within functional limits Resonance: Within functional limits Articulation: (min dec'd articulation of few speech sounds; missing teeth) Intelligibility: Intelligible(adequate) Motor Planning: Witnin functional limits Motor Speech Errors: Not applicable Interfering Components: Inadequate dentition Effective Techniques: Increased vocal intensity;Over-articulate;Slow rate   GO                     Jerilynn SomKatherine Roscoe Witts, MS, CCC-SLP Bayla Mcgovern 07/14/2018, 3:53 PM

## 2018-07-14 NOTE — Progress Notes (Signed)
[facility] - [place] [facility]   PATIENT NAME: Miguel [name]    MR#:  [id number]  DATE OF BIRTH:  10/10/1950  SUBJECTIVE:  CHIEF COMPLAINT:   The patient has no complaints.  He came from [place] [facility]  REVIEW OF SYSTEMS:  CONSTITUTIONAL: No fever, fatigue or weakness.  EYES: No blurred or double vision.  EARS, NOSE, AND THROAT: No tinnitus or ear pain.  RESPIRATORY: No cough, shortness of breath, wheezing or hemoptysis.  CARDIOVASCULAR: No chest pain, orthopnea, edema.  GASTROINTESTINAL: No nausea, vomiting, diarrhea or abdominal pain.  GENITOURINARY: No dysuria, hematuria.  ENDOCRINE: No polyuria, nocturia,  HEMATOLOGY: No anemia, easy bruising or bleeding SKIN: No rash or lesion. MUSCULOSKELETAL: No joint pain or arthritis.   NEUROLOGIC: No tingling, numbness, weakness.  PSYCHIATRY: No anxiety or depression.   DRUG ALLERGIES:  No Known Allergies  VITALS:  Blood pressure 129/75, pulse 85, temperature 98 F (36.7 C), temperature source Oral, resp. rate 18, height 6\' 1"  (1.854 m), weight 79.4 kg, SpO2 98 %.  PHYSICAL EXAMINATION:  GENERAL:  67 y.o.-year-old patient lying in the bed with no acute distress.  EYES: Pupils equal, round, reactive to light and accommodation. No scleral icterus. Extraocular muscles intact.  HEENT: Head atraumatic, normocephalic. Oropharynx and nasopharynx clear.  NECK:  Supple, no jugular venous distention. No thyroid enlargement, no tenderness.  LUNGS: Normal breath sounds bilaterally, no wheezing, rales,rhonchi or crepitation. No use of accessory muscles of respiration.  CARDIOVASCULAR: S1, S2 normal. No murmurs, rubs, or gallops.  ABDOMEN: Soft, nontender, nondistended. Bowel sounds present. No organomegaly or mass.  EXTREMITIES: No pedal edema, cyanosis, or clubbing.  NEUROLOGIC: Cranial nerves II through XII are intact. Muscle strength 4/5 in rt upper and lower extremities.  Strength intact in  left-sided extremities sensation intact. Gait not checked.  PSYCHIATRIC: The patient is alert and oriented x 3.  SKIN: No obvious rash, lesion, or ulcer.    LABORATORY PANEL:   CBC Recent Labs  Lab 07/12/18 1918  WBC 5.2  HGB 16.8  HCT 50.6  PLT 348   ------------------------------------------------------------------------------------------------------------------  Chemistries  Recent Labs  Lab 07/12/18 1918  NA 139  K 4.3  CL 101  CO2 29  GLUCOSE 84  BUN 19  CREATININE 1.05  CALCIUM 9.7  AST 20  ALT 17  ALKPHOS 57  BILITOT 0.6   ------------------------------------------------------------------------------------------------------------------  Cardiac Enzymes Recent Labs  Lab 07/12/18 1918  TROPONINI <0.03   ------------------------------------------------------------------------------------------------------------------  RADIOLOGY:  Ct Angio Head W Or Wo Contrast  Result Date: 07/12/2018 CLINICAL DATA:  67 year old male with loss of speech and right side weakness. EXAM: CT ANGIOGRAPHY HEAD AND NECK CT PERFUSION BRAIN TECHNIQUE: Multidetector CT imaging of the head and neck was performed using the standard protocol during bolus administration of intravenous contrast. Multiplanar CT image reconstructions and MIPs were obtained to evaluate the vascular anatomy. Carotid stenosis measurements (when applicable) are obtained utilizing NASCET criteria, using the distal internal carotid diameter as the denominator. Multiphase CT imaging of the brain was performed following IV bolus contrast injection. Subsequent parametric perfusion maps were calculated using RAPID software. CONTRAST:  <MEASUREMENSharyn Cream Shary Sharyn CreamerIOPAMIDOL (ISOVUE Sharyn CreamerE Sharyn Creamer COMPARISON:  Noncontrast head CT 1853 hours today. FINDINGS: CT Brain Perfusion Findings: CBF (<30%) Volume: 9mL Perf27musion (Tmax59m>6.0s) [id number]: 2Anselm 40Anselm Pa38Anselm Pa26Anselm Pancoast Volume: [id number] Infarction Location:Left anterior MCA, inferior frontal operculum. CTA  NECK Skeleton: Advanced degenerative disease in the cervical spine. Carious dentition. No acute osseous abnormality identified. Upper chest: Centrilobular emphysema. Otherwise negative. Other neck: Negative.  Aortic arch: 3 vessel arch configuration. Soft and calcified arch atherosclerosis with no great vessel origin stenosis. Right carotid system: Negative. Left carotid system: Intermittent soft plaque in the left CCA without stenosis. The left carotid bifurcation appears normal. No cervical left ICA plaque or stenosis. Vertebral arteries: Proximal right subclavian artery and right vertebral artery origin are normal. The right vertebral artery is patent with tortuosity but no stenosis to the skull base. No proximal left subclavian artery stenosis despite some plaque. Normal left vertebral artery origin. Patent left vertebral artery to the skull base. CTA HEAD Posterior circulation: Patent distal vertebral arteries with tortuosity but no stenosis. Patent vertebrobasilar junction and basilar artery without stenosis. Patent SCA and PCA origins. Left posterior communicating artery is present with a fetal type left PCA origin. The right posterior communicating is diminutive or absent. There is mild to moderate irregularity and stenosis of the non dominant left P1, but otherwise the bilateral PCA branches are normal. Anterior circulation: Both ICA siphons are patent. Mild siphon stenosis appears greater on the right. No stenosis. Normal ophthalmic and left posterior communicating artery origins. Patent carotid termini. Patent MCA and ACA origins. Anterior communicating artery and bilateral ACA branches are within normal limits. Right MCA M1 segment, bifurcation, and right MCA branches are within normal limits. The left M1 and left MCA trifurcation are patent without stenosis. There are 3 different right M3 branch occlusions identified on series 13, images 33 and 34, and also annotated on the thin sagittal images series  10. There is no M2 branch occlusion. Venous sinuses: Patent. Anatomic variants: Fetal type left PCA origin. Delayed phase: Possible cortical hypodensity in the left inferior frontal gyrus now on series 14, image 11, but no other CT changes of acute cortical infarct identified. No intracranial hemorrhage or mass effect. No abnormal enhancement identified. Review of the MIP images confirms the above findings IMPRESSION: 1. CT perfusion detects an acute Left MCA territory infarct - anterior division core infarct of 9 mL - penumbra volume in the anterior and posterior divisions of 53 mL. 2. However, CTA does NOT reveal large vessel occlusion. Rather there are three different Left MCA M3 branch occlusions identified. 3. The above was discussed by telephone with Dr. Phineas Semen on 07/12/2018 at 2114 hours. 4. Carotid atherosclerosis in the head and neck without significant stenosis. 5. Fetal type Left PCA origin with mild to moderate irregularity and stenosis in the left P1, but might not be consequential due to the normal left Pcomm supply. Electronically Signed   By: Odessa Fleming M.D.   On: 07/12/2018 21:25   Ct Angio Neck W Or Wo Contrast  Result Date: 07/12/2018 CLINICAL DATA:  67 year old male with loss of speech and right side weakness. EXAM: CT ANGIOGRAPHY HEAD AND NECK CT PERFUSION BRAIN TECHNIQUE: Multidetector CT imaging of the head and neck was performed using the standard protocol during bolus administration of intravenous contrast. Multiplanar CT image reconstructions and MIPs were obtained to evaluate the vascular anatomy. Carotid stenosis measurements (when applicable) are obtained utilizing NASCET criteria, using the distal internal carotid diameter as the denominator. Multiphase CT imaging of the brain was performed following IV bolus contrast injection. Subsequent parametric perfusion maps were calculated using RAPID software. CONTRAST:  ISOVUE-370 IOPAMIDOL (ISOVUE-370) INJECTION 76%  COMPARISON:  Noncontrast head CT 1853 hours today. FINDINGS: CT Brain Perfusion Findings: CBF (<30%) Volume: 9mL Perfusion (Tmax>6.0s) volume: 53mL Mismatch Volume: 44mL Infarction Location:Left anterior MCA, inferior frontal operculum. CTA NECK Skeleton: Advanced degenerative disease in  the cervical spine. Carious dentition. No acute osseous abnormality identified. Upper chest: Centrilobular emphysema. Otherwise negative. Other neck: Negative. Aortic arch: 3 vessel arch configuration. Soft and calcified arch atherosclerosis with no great vessel origin stenosis. Right carotid system: Negative. Left carotid system: Intermittent soft plaque in the left CCA without stenosis. The left carotid bifurcation appears normal. No cervical left ICA plaque or stenosis. Vertebral arteries: Proximal right subclavian artery and right vertebral artery origin are normal. The right vertebral artery is patent with tortuosity but no stenosis to the skull base. No proximal left subclavian artery stenosis despite some plaque. Normal left vertebral artery origin. Patent left vertebral artery to the skull base. CTA HEAD Posterior circulation: Patent distal vertebral arteries with tortuosity but no stenosis. Patent vertebrobasilar junction and basilar artery without stenosis. Patent SCA and PCA origins. Left posterior communicating artery is present with a fetal type left PCA origin. The right posterior communicating is diminutive or absent. There is mild to moderate irregularity and stenosis of the non dominant left P1, but otherwise the bilateral PCA branches are normal. Anterior circulation: Both ICA siphons are patent. Mild siphon stenosis appears greater on the right. No stenosis. Normal ophthalmic and left posterior communicating artery origins. Patent carotid termini. Patent MCA and ACA origins. Anterior communicating artery and bilateral ACA branches are within normal limits. Right MCA M1 segment, bifurcation, and right MCA branches  are within normal limits. The left M1 and left MCA trifurcation are patent without stenosis. There are 3 different right M3 branch occlusions identified on series 13, images 33 and 34, and also annotated on the thin sagittal images series 10. There is no M2 branch occlusion. Venous sinuses: Patent. Anatomic variants: Fetal type left PCA origin. Delayed phase: Possible cortical hypodensity in the left inferior frontal gyrus now on series 14, image 11, but no other CT changes of acute cortical infarct identified. No intracranial hemorrhage or mass effect. No abnormal enhancement identified. Review of the MIP images confirms the above findings IMPRESSION: 1. CT perfusion detects an acute Left MCA territory infarct - anterior division core infarct of 9 mL - penumbra volume in the anterior and posterior divisions of 53 mL. 2. However, CTA does NOT reveal large vessel occlusion. Rather there are three different Left MCA M3 branch occlusions identified. 3. The above was discussed by telephone with Dr. Phineas Semen on 07/12/2018 at 2114 hours. 4. Carotid atherosclerosis in the head and neck without significant stenosis. 5. Fetal type Left PCA origin with mild to moderate irregularity and stenosis in the left P1, but might not be consequential due to the normal left Pcomm supply. Electronically Signed   By: Odessa Fleming M.D.   On: 07/12/2018 21:25   Mr Brain Wo Contrast  Result Date: 07/13/2018 CLINICAL DATA:  Right-sided weakness and speech disturbance. EXAM: MRI HEAD WITHOUT CONTRAST TECHNIQUE: Multiplanar, multiecho pulse sequences of the brain and surrounding structures were obtained without intravenous contrast. COMPARISON:  Head CT and CTA 07/12/2018 FINDINGS: Brain: There is a small to moderate-sized acute left MCA territory infarct primarily involving the frontal operculum and anterior insula. There is mild involvement of the posterior left insula and inferolateral left parietal lobe as well. No intracranial  hemorrhage, mass, midline shift, or extra-axial fluid collection is identified. The ventricles and sulci are normal for age. Scattered small foci of T2 hyperintensity in the cerebral white matter bilaterally are nonspecific but compatible with mild chronic small vessel ischemic disease. Vascular: Major intracranial arterial flow voids are preserved. Lack of a  flow void in the right sigmoid sinus and right jugular bulb likely reflects slow flow given normal appearance on yesterday's CTA. Skull and upper cervical spine: Unremarkable bone marrow signal. Sinuses/Orbits: Unremarkable orbits. Paranasal sinuses and mastoid air cells are clear. Other: None. IMPRESSION: 1. Acute nonhemorrhagic left MCA infarct. 2. Mild chronic small vessel ischemic disease. Electronically Signed   By: Sebastian Ache M.D.   On: 07/13/2018 10:54   Ct Cerebral Perfusion W Contrast  Result Date: 07/12/2018 CLINICAL DATA:  67 year old male with loss of speech and right side weakness. EXAM: CT ANGIOGRAPHY HEAD AND NECK CT PERFUSION BRAIN TECHNIQUE: Multidetector CT imaging of the head and neck was performed using the standard protocol during bolus administration of intravenous contrast. Multiplanar CT image reconstructions and MIPs were obtained to evaluate the vascular anatomy. Carotid stenosis measurements (when applicable) are obtained utilizing NASCET criteria, using the distal internal carotid diameter as the denominator. Multiphase CT imaging of the brain was performed following IV bolus contrast injection. Subsequent parametric perfusion maps were calculated using RAPID software. CONTRAST:  ISOVUE-370 IOPAMIDOL (ISOVUE-370) INJECTION 76% COMPARISON:  Noncontrast head CT 1853 hours today. FINDINGS: CT Brain Perfusion Findings: CBF (<30%) Volume: 9mL Perfusion (Tmax>6.0s) volume: 53mL Mismatch Volume: 44mL Infarction Location:Left anterior MCA, inferior frontal operculum. CTA NECK Skeleton: Advanced degenerative disease in the  cervical spine. Carious dentition. No acute osseous abnormality identified. Upper chest: Centrilobular emphysema. Otherwise negative. Other neck: Negative. Aortic arch: 3 vessel arch configuration. Soft and calcified arch atherosclerosis with no great vessel origin stenosis. Right carotid system: Negative. Left carotid system: Intermittent soft plaque in the left CCA without stenosis. The left carotid bifurcation appears normal. No cervical left ICA plaque or stenosis. Vertebral arteries: Proximal right subclavian artery and right vertebral artery origin are normal. The right vertebral artery is patent with tortuosity but no stenosis to the skull base. No proximal left subclavian artery stenosis despite some plaque. Normal left vertebral artery origin. Patent left vertebral artery to the skull base. CTA HEAD Posterior circulation: Patent distal vertebral arteries with tortuosity but no stenosis. Patent vertebrobasilar junction and basilar artery without stenosis. Patent SCA and PCA origins. Left posterior communicating artery is present with a fetal type left PCA origin. The right posterior communicating is diminutive or absent. There is mild to moderate irregularity and stenosis of the non dominant left P1, but otherwise the bilateral PCA branches are normal. Anterior circulation: Both ICA siphons are patent. Mild siphon stenosis appears greater on the right. No stenosis. Normal ophthalmic and left posterior communicating artery origins. Patent carotid termini. Patent MCA and ACA origins. Anterior communicating artery and bilateral ACA branches are within normal limits. Right MCA M1 segment, bifurcation, and right MCA branches are within normal limits. The left M1 and left MCA trifurcation are patent without stenosis. There are 3 different right M3 branch occlusions identified on series 13, images 33 and 34, and also annotated on the thin sagittal images series 10. There is no M2 branch occlusion. Venous sinuses:  Patent. Anatomic variants: Fetal type left PCA origin. Delayed phase: Possible cortical hypodensity in the left inferior frontal gyrus now on series 14, image 11, but no other CT changes of acute cortical infarct identified. No intracranial hemorrhage or mass effect. No abnormal enhancement identified. Review of the MIP images confirms the above findings IMPRESSION: 1. CT perfusion detects an acute Left MCA territory infarct - anterior division core infarct of 9 mL - penumbra volume in the anterior and posterior divisions of 53 mL. 2. However,  CTA does NOT reveal large vessel occlusion. Rather there are three different Left MCA M3 branch occlusions identified. 3. The above was discussed by telephone with Dr. Phineas Semen on 07/12/2018 at 2114 hours. 4. Carotid atherosclerosis in the head and neck without significant stenosis. 5. Fetal type Left PCA origin with mild to moderate irregularity and stenosis in the left P1, but might not be consequential due to the normal left Pcomm supply. Electronically Signed   By: Odessa Fleming M.D.   On: 07/12/2018 21:25   Ct Head Code Stroke Wo Contrast`  Result Date: 07/12/2018 CLINICAL DATA:  Code stroke. 67 year old male with right side weakness, loss of speech. By report, last seen normal at noon today. EXAM: CT HEAD WITHOUT CONTRAST TECHNIQUE: Contiguous axial images were obtained from the base of the skull through the vertex without intravenous contrast. COMPARISON:  Report of noncontrast head CT 06/08/1998 (no images available). FINDINGS: Brain: No midline shift, ventriculomegaly, mass effect, evidence of mass lesion, intracranial hemorrhage or evidence of cortically based acute infarction. Gray-white matter differentiation is within normal limits throughout the brain. Vascular: Mild Calcified atherosclerosis at the skull base. No suspicious intracranial vascular hyperdensity. Skull: Negative. Sinuses/Orbits: Visualized paranasal sinuses and mastoids are well pneumatized.  Other: Orbit and scalp soft tissues are within normal limits. ASPECTS [facility] Stroke Program Early CT Score) - Ganglionic level infarction (caudate, lentiform nuclei, internal capsule, insula, M1-M3 cortex): 7 - Supraganglionic infarction (M4-M6 cortex): 3 Total score (0-10 with 10 being normal): 10 IMPRESSION: 1. Normal noncontrast CT appearance of the brain. 2. ASPECTS is 10. 3. Study discussed by telephone with Dr. Derrill Kay in the ED on 07/12/2018 at 19:14 . We discussed that a CTA - CT Perfusion was planned but that two IV accesses have failed. So I recommended stat MRI brain diffusion imaging and time-of-flight circle-of-Willis MRA instead. Electronically Signed   By: Odessa Fleming M.D.   On: 07/12/2018 19:16    EKG:   Orders placed or performed during the hospital encounter of 07/12/18  . ED EKG  . ED EKG  . EKG 12-Lead  . EKG 12-Lead    ASSESSMENT AND PLAN:   Acute nonhemorrhagic left MCA stroke Clinically stable Seen by neurology Dr. Lucia Gaskins, appreciate neurology recommendations CT angiogram of the head and neck has revealed left MCA infarct MRI of the brain-acute nonhemorrhagic left MCA infarct Echocardiogram with ejection fraction 25 to 30%, high risk for left ventricular mural thrombus given large lesions of severe hypokinesis LDL 119, started patient on high intensity statin Hemoglobin A1c 5.8. PT OT evaluation-no formal PT needs identified Patient has passed bedside swallow evaluation neurology has recommended TEE, discussed with on-call cardiology Dr. Mariah Milling who is recommending to start the patient on Coumadin and outpatient follow-up in 3 to 4 weeks.  No need of TEE from cardiology standpoint Neurology is agreeable to started the patient on Coumadin and discontinued aspirin. Consult case management/social worker regarding Coumadin arrangement and follow-up blood work as the patient is from [facility].  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   All the records are reviewed and case discussed with Care Management/Social Workerr. Management plans discussed with the patient, family and they are in agreement.  CODE STATUS: FC  TOTAL TIME TAKING CARE OF THIS PATIENT: 32 minutes.   POSSIBLE D/C IN 1-2 DAYS, DEPENDING ON CLINICAL CONDITION.  Note: This dictation was prepared with Dragon dictation along with smaller phrase technology. Any transcriptional errors that result from this process are unintentional.   Shaune Pollack M.D on 07/14/2018 at 2:17 PM  Between 7am to 6pm - Pager - [phone] After 6pm go to [email or web address] - password [email or web address]  [facility]  Office  [phone]  CC: Primary care physician; Patient, No Pcp Per

## 2018-07-14 NOTE — Progress Notes (Signed)
ANTICOAGULATION CONSULT NOTE  Pharmacy Consult for warfarin Indication: stroke  No Known Allergies  Patient Measurements: Height: 6\' 1"  (185.4 cm) Weight: 175 lb (79.4 kg) IBW/kg (Calculated) : 79.9   Vital Signs: Temp: 98 F (36.7 C) (11/18 0522) Temp Source: Oral (11/18 0522) BP: 129/75 (11/18 0522) Pulse Rate: 85 (11/18 0522)  Labs: Recent Labs    07/12/18 1918 07/14/18 0429  HGB 16.8  --   HCT 50.6  --   PLT 348  --   APTT 34  --   LABPROT 13.2 12.7  INR 1.01 0.96  CREATININE 1.05  --   TROPONINI <0.03  --     Estimated Creatinine Clearance: 78.8 mL/min (by C-G formula based on SCr of 1.05 mg/dL).   Medical History: Past Medical History:  Diagnosis Date  . Schizophrenia [facility](HCC)     Assessment: 67 year old male s/p left MCA stroke. No anticoagulation PTA. Patient high risk for left ventricular mural thrombus. Cardiology recommending warfarin and f/u in 3-4 weeks. Neurology in agreement and discontinued aspirin.  Confirmed with Dr. Amado CoeGouru that Cardiology did not wish to bridge the patient until INR therapeutic. Pharmacy to start warfarin tonight.  Warfarin Course DATE INR DOSE 11/17 1.01 7.5 mg 11/18 0.96  Goal of Therapy:  INR 2-3 Monitor platelets by anticoagulation protocol: Yes   Plan:  INR subtherapeutic. No high-risk characteristics identified. Total predictor points approximately 7. Will give warfarin 7.5 mg po x 1 again today. Follow labs per protocol.  Carola FrostNathan A Susen Haskew, Pharm.D., BCPS Clinical Pharmacist 07/14/2018 7:12 AM

## 2018-07-14 NOTE — Plan of Care (Signed)
  Problem: Education: Goal: Knowledge of General Education information will improve Description Including pain rating scale, medication(s)/side effects and non-pharmacologic comfort measures Outcome: Progressing   Problem: Health Behavior/Discharge Planning: Goal: Ability to manage health-related needs will improve Outcome: Progressing   Problem: Clinical Measurements: Goal: Ability to maintain clinical measurements within normal limits will improve Outcome: Progressing Goal: Will remain free from infection Outcome: Progressing Goal: Diagnostic test results will improve Outcome: Progressing Goal: Respiratory complications will improve Outcome: Progressing Goal: Cardiovascular complication will be avoided Outcome: Progressing   Problem: Activity: Goal: Risk for activity intolerance will decrease Outcome: Progressing   Problem: Nutrition: Goal: Adequate nutrition will be maintained Outcome: Progressing   Problem: Coping: Goal: Level of anxiety will decrease Outcome: Progressing   Problem: Elimination: Goal: Will not experience complications related to bowel motility Outcome: Progressing Goal: Will not experience complications related to urinary retention Outcome: Progressing   Problem: Pain Managment: Goal: General experience of comfort will improve Outcome: Progressing   Problem: Safety: Goal: Ability to remain free from injury will improve Outcome: Progressing   Problem: Skin Integrity: Goal: Risk for impaired skin integrity will decrease Outcome: Progressing   Problem: Education: Goal: Knowledge of disease or condition will improve Outcome: Progressing Goal: Knowledge of secondary prevention will improve Outcome: Progressing Goal: Knowledge of patient specific risk factors addressed and post discharge goals established will improve Outcome: Progressing   Problem: Health Behavior/Discharge Planning: Goal: Ability to manage health-related needs will  improve Outcome: Progressing   Problem: Nutrition: Goal: Risk of aspiration will decrease Outcome: Progressing Goal: Dietary intake will improve Outcome: Progressing   Problem: Education: Goal: Knowledge of secondary prevention will improve Outcome: Progressing Goal: Knowledge of patient specific risk factors addressed and post discharge goals established will improve Outcome: Progressing   Problem: Ischemic Stroke/TIA Tissue Perfusion: Goal: Complications of ischemic stroke/TIA will be minimized Outcome: Progressing   

## 2018-07-14 NOTE — Care Management Note (Addendum)
Case Management Note  Patient Details  Name: Miguel [name] MRN: [id number] Date of Birth: 05/02/1951  Subjective/Objective:     Admitted to [facility] with the diagnosis of stroke. [facility].                Action/Plan: Received referral for help with medications. If Miguel [name] goes back to the [facility]. There are physicians, nurses and other medical staff that will help with medications.  Will do further assessment if goes home.   Expected Discharge Date:                  Expected Discharge Plan:     In-House Referral:   yes  Discharge planning Services   yes  Post Acute Care Choice:    Choice offered to:     DME Arranged:    DME Agency:     HH Arranged:    HH Agency:     Status of Service:     If discussed at [employer] Length of Stay Meetings, dates discussed:    Additional Comments:  Gwenette GreetBrenda S Sparrow [place], RN MSN CCM Care Management [phone] 07/14/2018, 11:15 AM

## 2018-07-15 LAB — CBC
HEMATOCRIT: 41.3 % (ref 39.0–52.0)
Hemoglobin: 13.6 g/dL (ref 13.0–17.0)
MCH: 31.9 pg (ref 26.0–34.0)
MCHC: 32.9 g/dL (ref 30.0–36.0)
MCV: 96.9 fL (ref 80.0–100.0)
NRBC: 0 % (ref 0.0–0.2)
Platelets: 316 10*3/uL (ref 150–400)
RBC: 4.26 MIL/uL (ref 4.22–5.81)
RDW: 11.9 % (ref 11.5–15.5)
WBC: 5.7 10*3/uL (ref 4.0–10.5)

## 2018-07-15 LAB — PROTIME-INR
INR: 1.1
Prothrombin Time: 14.1 seconds (ref 11.4–15.2)

## 2018-07-15 MED ORDER — ATORVASTATIN CALCIUM 40 MG PO TABS: 40.0000 mg | ORAL_TABLET | Freq: Every day | ORAL | 2 refills | Status: DC

## 2018-07-15 MED ORDER — WARFARIN SODIUM 5 MG PO TABS: 5.0000 mg | ORAL_TABLET | Freq: Every day | ORAL | 0 refills | Status: DC

## 2018-07-15 MED ORDER — ENALAPRIL MALEATE 2.5 MG PO TABS: 2.5000 mg | ORAL_TABLET | Freq: Every day | ORAL | 2 refills | Status: DC

## 2018-07-15 NOTE — Discharge Summary (Signed)
[facility] [place] [facility]   PATIENT NAME: Miguel [name]    MR#:  [id number]  DATE OF BIRTH:  11/07/1950  DATE OF ADMISSION:  07/12/2018   ADMITTING PHYSICIAN: Oralia Manisavid Willis, MD  DATE OF DISCHARGE: 07/15/2018 PRIMARY CARE PHYSICIAN: Patient, No Pcp Per   ADMISSION DIAGNOSIS:  Aphasia [R47.01] Cerebrovascular accident (CVA), unspecified mechanism (HCC) [I63.9] DISCHARGE DIAGNOSIS:  Principal Problem:   Stroke [facility](HCC) Active Problems:   Schizophrenia (HCC)  SECONDARY DIAGNOSIS:   Past Medical History:  Diagnosis Date  . Schizophrenia [facility](HCC)    HOSPITAL COURSE:  Acute nonhemorrhagic left MCA stroke Clinically stable Seen by neurology Dr. Lucia GaskinsAhern, appreciate neurology recommendations CT angiogram of the head and neck has revealed left MCA infarct MRI of the brain-acute nonhemorrhagic left MCA infarct Echocardiogram with ejection fraction 25 to 30%, high risk for left ventricular mural thrombus given large lesions of severe hypokinesis LDL 119, started patient on high intensity statin Hemoglobin A1c 5.8. PT OT evaluation-no formal PT needs identified Patient has passed bedside swallow evaluation neurology has recommended TEE, discussed with on-call cardiology Dr. Mariah MillingGollan who is recommending to start the patient on Coumadin and outpatient follow-up in 3 to 4 weeks.  No need of TEE from cardiology standpoint Neurology is agreeable to started the patient on Coumadin and discontinued aspirin. Consult case management/social worker regarding Coumadin arrangement and follow-up blood work as the [facility] is from prison. Added ACEI due to low EF.  Hyperlipidemia - Lipitor.  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  DISCHARGE CONDITIONS:  Stable, discharge to back to prison today. CONSULTS OBTAINED:  Treatment Team:    Thana Farreynolds, Leslie, MD Shaune Pollackhen, Lubertha Leite, MD DRUG ALLERGIES:  No Known Allergies DISCHARGE MEDICATIONS:   Allergies as of 07/15/2018   No Known Allergies     Medication List    TAKE these medications   ABILIFY MAINTENA 400 MG Prsy prefilled syringe Generic drug:  ARIPiprazole ER Inject 400 mg into the muscle every 30 (thirty) days.   atorvastatin 40 MG tablet Commonly known as:  LIPITOR Take 1 tablet (40 mg total) by mouth daily at 6 PM.   enalapril 2.5 MG tablet Commonly known as:  VASOTEC Take 1 tablet (2.5 mg total) by mouth daily.   warfarin 5 MG tablet Commonly known as:  COUMADIN Take 1 tablet (5 mg total) by mouth daily.        DISCHARGE INSTRUCTIONS:  See AVS.  If you experience worsening of your admission symptoms, develop shortness of breath, life threatening emergency, suicidal or homicidal thoughts you must seek medical attention immediately by calling 911 or calling your MD immediately  if symptoms less severe.  You Must read complete instructions/literature along with all the possible adverse reactions/side effects for all the Medicines you take and that have been prescribed to you. Take any new Medicines after you have completely understood and accpet all the possible adverse reactions/side effects.   Please note  You were cared for by a hospitalist during your hospital stay. If you have any questions about your discharge medications or the care you received while you were in the hospital after you are discharged, you can call the unit and asked to speak with the hospitalist on call if the hospitalist that took care of you is not available. Once you are discharged, your primary care physician will handle any  further medical issues. Please note that NO REFILLS for any discharge medications will be authorized once you are discharged, as it is imperative that you return to your primary care physician (or establish a relationship with a primary care physician if you do  not have one) for your aftercare needs so that they can reassess your need for medications and monitor your lab values.    On the day of Discharge:  VITAL SIGNS:  Blood pressure 118/69, pulse 66, temperature 98.2 F (36.8 C), temperature source Oral, resp. rate 18, height 6\' 1"  (1.854 m), weight 79.4 kg, SpO2 98 %. PHYSICAL EXAMINATION:  GENERAL:  67 y.o.-year-old patient lying in the bed with no acute distress.  EYES: Pupils equal, round, reactive to light and accommodation. No scleral icterus. Extraocular muscles intact.  HEENT: Head atraumatic, normocephalic. Oropharynx and nasopharynx clear.  NECK:  Supple, no jugular venous distention. No thyroid enlargement, no tenderness.  LUNGS: Normal breath sounds bilaterally, no wheezing, rales,rhonchi or crepitation. No use of accessory muscles of respiration.  CARDIOVASCULAR: S1, S2 normal. No murmurs, rubs, or gallops.  ABDOMEN: Soft, non-tender, non-distended. Bowel sounds present. No organomegaly or mass.  EXTREMITIES: No pedal edema, cyanosis, or clubbing.  NEUROLOGIC: Cranial nerves II through XII are intact. Muscle strength 5/5 in all extremities. Sensation intact. Gait not checked.  PSYCHIATRIC: The patient is alert and oriented x 3.  SKIN: No obvious rash, lesion, or ulcer.  DATA REVIEW:   CBC Recent Labs  Lab 07/15/18 0509  WBC 5.7  HGB 13.6  HCT 41.3  PLT 316    Chemistries  Recent Labs  Lab 07/12/18 1918  NA 139  K 4.3  CL 101  CO2 29  GLUCOSE 84  BUN 19  CREATININE 1.05  CALCIUM 9.7  AST 20  ALT 17  ALKPHOS 57  BILITOT 0.6     Microbiology Results  Results for orders placed or performed during the hospital encounter of 07/12/18  MRSA PCR Screening     Status: None   Collection Time: 07/13/18  2:38 AM  Result Value Ref Range Status   MRSA by PCR NEGATIVE NEGATIVE Final    Comment:        The GeneXpert MRSA Assay (FDA approved for NASAL specimens only), is one component of a comprehensive MRSA  colonization surveillance program. It is not intended to diagnose MRSA infection nor to guide or monitor treatment for MRSA infections. Performed at [facility], [address], [place], [place] [address]     RADIOLOGY:  No results found.   Management plans discussed with the patient, family and they are in agreement.  CODE STATUS: Full Code   TOTAL TIME TAKING CARE OF THIS PATIENT: 32 minutes.    Shaune Pollack M.D on 07/15/2018 at 7:46 AM  Between 7am to 6pm - Pager - [phone]  After 6pm go to [email or web address] - Social research officer, government  [facility]  Office  [phone]  CC: Primary care physician; Patient, No Pcp Per   Note: This dictation was prepared with Dragon dictation along with smaller phrase technology. Any transcriptional errors that result from this process are unintentional.

## 2018-07-15 NOTE — Plan of Care (Signed)
  Problem: Education: Goal: Knowledge of General Education information will improve Description Including pain rating scale, medication(s)/side effects and non-pharmacologic comfort measures Outcome: Progressing   Problem: Health Behavior/Discharge Planning: Goal: Ability to manage health-related needs will improve Outcome: Progressing   Problem: Clinical Measurements: Goal: Ability to maintain clinical measurements within normal limits will improve Outcome: Progressing Goal: Will remain free from infection Outcome: Progressing Goal: Diagnostic test results will improve Outcome: Progressing Goal: Respiratory complications will improve Outcome: Progressing Goal: Cardiovascular complication will be avoided Outcome: Progressing   Problem: Activity: Goal: Risk for activity intolerance will decrease Outcome: Progressing   Problem: Nutrition: Goal: Adequate nutrition will be maintained Outcome: Progressing   Problem: Coping: Goal: Level of anxiety will decrease Outcome: Progressing   Problem: Elimination: Goal: Will not experience complications related to bowel motility Outcome: Progressing Goal: Will not experience complications related to urinary retention Outcome: Progressing   Problem: Pain Managment: Goal: General experience of comfort will improve Outcome: Progressing   Problem: Safety: Goal: Ability to remain free from injury will improve Outcome: Progressing   Problem: Skin Integrity: Goal: Risk for impaired skin integrity will decrease Outcome: Progressing   Problem: Education: Goal: Knowledge of disease or condition will improve Outcome: Progressing Goal: Knowledge of secondary prevention will improve Outcome: Progressing Goal: Knowledge of patient specific risk factors addressed and post discharge goals established will improve Outcome: Progressing   Problem: Health Behavior/Discharge Planning: Goal: Ability to manage health-related needs will  improve Outcome: Progressing   Problem: Nutrition: Goal: Risk of aspiration will decrease Outcome: Progressing Goal: Dietary intake will improve Outcome: Progressing   Problem: Education: Goal: Knowledge of secondary prevention will improve Outcome: Progressing Goal: Knowledge of patient specific risk factors addressed and post discharge goals established will improve Outcome: Progressing   Problem: Ischemic Stroke/TIA Tissue Perfusion: Goal: Complications of ischemic stroke/TIA will be minimized Outcome: Progressing

## 2018-07-15 NOTE — Progress Notes (Signed)
Pt for [facility] back to jail. Guard at bedside.no distress. Instructions discussed with pt. Packet with pt.  Nurse at jail called and info sent with pts packet. Diet / activity and f./u discussed.sl d/cd.

## 2018-07-15 NOTE — Discharge Instructions (Signed)
Follow up INR to adjust coumadin dose by physician/RN in prison. Smoking cessation.

## 2018-11-21 ENCOUNTER — Other Ambulatory Visit: Payer: Self-pay

## 2018-11-21 ENCOUNTER — Emergency Department
Admission: EM | Admit: 2018-11-21 | Discharge: 2018-11-21 | Disposition: A | Discharge status: Home / Self Care | Payer: Medicare Other | Attending: Emergency Medicine | Admitting: Emergency Medicine

## 2018-11-21 DIAGNOSIS — Z79899 Other long term (current) drug therapy: Secondary | ICD-10-CM | POA: Insufficient documentation

## 2018-11-21 DIAGNOSIS — Z7901 Long term (current) use of anticoagulants: Secondary | ICD-10-CM | POA: Diagnosis not present

## 2018-11-21 DIAGNOSIS — H9202 Otalgia, left ear: Secondary | ICD-10-CM | POA: Diagnosis present

## 2018-11-21 DIAGNOSIS — H6122 Impacted cerumen, left ear: Secondary | ICD-10-CM | POA: Diagnosis not present

## 2018-11-21 MED ORDER — CARBAMIDE PEROXIDE 6.5 % OT SOLN
5.0000 [drp] | Freq: Two times a day (BID) | OTIC | Status: DC
  Administered 2018-11-21: 5 [drp] via OTIC

## 2018-11-21 NOTE — ED Triage Notes (Signed)
Pt in with co left earache and muffled hearing.

## 2018-11-21 NOTE — ED Provider Notes (Signed)
[facility] Emergency Department Provider Note    First MD Initiated Contact with Patient 11/21/18 [phone]     (approximate)  I have reviewed the triage vital signs and the nursing notes.   HISTORY  Chief Complaint Otalgia    HPI Miguel [name] is a 68 y.o. male with history of schizophrenia and stroke presents to the emergency department with left earache and muffled hearing which began tonight after attempting to clean his ear.        Past Medical History:  Diagnosis Date  . Schizophrenia [facility])     Patient Active Problem List   Diagnosis Date Noted  . Stroke (HCC) 07/12/2018  . Schizophrenia (HCC) 04/11/2016  . Noncompliance 04/11/2016    Past Surgical History:  Procedure Laterality Date  . APPENDECTOMY      Prior to Admission medications   Medication Sig Start Date End Date Taking? Authorizing Provider  ABILIFY MAINTENA 400 MG PRSY prefilled syringe Inject 400 mg into the muscle every 30 (thirty) days. 06/27/18   [provider]  atorvastatin (LIPITOR) 40 MG tablet Take 1 tablet (40 mg total) by mouth daily at 6 PM. 07/15/18   Shaune Pollack, MD  enalapril (VASOTEC) 2.5 MG tablet Take 1 tablet (2.5 mg total) by mouth daily. 07/15/18   Shaune Pollack, MD  warfarin (COUMADIN) 5 MG tablet Take 1 tablet (5 mg total) by mouth daily. 07/15/18 08/14/18  Shaune Pollack, MD    Allergies Patient has no known allergies.  No family history on file.  Social History Social History   Tobacco Use  . Smoking status: Never Smoker  . Smokeless tobacco: Never Used  Substance Use Topics  . Alcohol use: No  . Drug use: No    Review of Systems Constitutional: No fever/chills Eyes: No visual changes. ENT: No sore throat.  Positive for left earache and decreased hearing Cardiovascular: Denies chest pain. Respiratory: Denies shortness of breath. Gastrointestinal: No abdominal pain.  No nausea, no vomiting.  No diarrhea.  No constipation.  Genitourinary: Negative for dysuria. Musculoskeletal: Negative for neck pain.  Negative for back pain. Integumentary: Negative for rash. Neurological: Negative for headaches, focal weakness or numbness.   ____________________________________________   PHYSICAL EXAM:  VITAL SIGNS: ED Triage Vitals  Enc Vitals Group     BP 11/21/18 0304 (!) 146/73     Pulse Rate 11/21/18 0304 67     Resp 11/21/18 0304 20     Temp 11/21/18 0304 98.8 F (37.1 C)     Temp Source 11/21/18 0304 Oral     SpO2 11/21/18 0304 97 %     Weight 11/21/18 0302 93.4 kg (206 lb)     Height 11/21/18 0302 1.829 m (6')     Head Circumference --      Peak Flow --      Pain Score 11/21/18 0302 8     Pain Loc --      Pain Edu? --      Excl. in GC? --     Constitutional: Alert and oriented. Well appearing and in no acute distress. Eyes: Conjunctivae are normal.  Head: Atraumatic. Ears: Left external auditory canal cerumen impaction Nose: No congestion/rhinnorhea. Mouth/Throat: Mucous membranes are moist. Neck: No stridor.  Neurologic:  Normal speech and language. No gross focal neurologic deficits are appreciated.  Skin:  Skin is warm, dry and intact. No rash noted. Psychiatric: Mood and affect are normal. Speech and behavior are normal.  __  Procedures  ____________________________________________   INITIAL IMPRESSION / MDM / ASSESSMENT AND PLAN / ED COURSE  As part of my medical decision making, I reviewed the following data within the electronic MEDICAL RECORD NUMBER   68 year old male presenting with above-stated history and physical exam consistent with left cerumen impaction.  Patient given Debrox eardrops     ____________________________________________  FINAL CLINICAL IMPRESSION(S) / ED DIAGNOSES  Final diagnoses:  Impacted cerumen of left ear     MEDICATIONS GIVEN DURING THIS VISIT:  Medications  carbamide peroxide (DEBROX) 6.5 % OTIC (EAR) solution 5 drop (5 drops Left EAR Given  11/21/18 4917)     ED Discharge Orders    None       Note:  This document was prepared using Dragon voice recognition software and may include unintentional dictation errors.   Darci Current, MD 11/21/18 [phone]

## 2019-12-11 ENCOUNTER — Ambulatory Visit: Payer: Self-pay

## 2021-10-09 ENCOUNTER — Other Ambulatory Visit: Payer: Self-pay

## 2021-10-09 ENCOUNTER — Emergency Department
Admission: EM | Admit: 2021-10-09 | Discharge: 2021-10-09 | Disposition: A | Discharge status: Home / Self Care | Payer: Medicare Other | Attending: Emergency Medicine | Admitting: Emergency Medicine

## 2021-10-09 ENCOUNTER — Emergency Department: Payer: Medicare Other

## 2021-10-09 DIAGNOSIS — J189 Pneumonia, unspecified organism: Secondary | ICD-10-CM | POA: Insufficient documentation

## 2021-10-09 DIAGNOSIS — R0981 Nasal congestion: Secondary | ICD-10-CM | POA: Diagnosis present

## 2021-10-09 DIAGNOSIS — M791 Myalgia, unspecified site: Secondary | ICD-10-CM | POA: Insufficient documentation

## 2021-10-09 DIAGNOSIS — Z20822 Contact with and (suspected) exposure to covid-19: Secondary | ICD-10-CM | POA: Insufficient documentation

## 2021-10-09 LAB — RESP PANEL BY RT-PCR (FLU A&B, COVID) ARPGX2
Influenza A by PCR: NEGATIVE
Influenza B by PCR: NEGATIVE
SARS Coronavirus 2 by RT PCR: NEGATIVE

## 2021-10-09 MED ORDER — BENZONATATE 100 MG PO CAPS: ORAL_CAPSULE | ORAL | 0 refills | Status: DC

## 2021-10-09 MED ORDER — AZITHROMYCIN 250 MG PO TABS: 250.0000 mg | ORAL_TABLET | Freq: Every day | ORAL | 0 refills | Status: AC

## 2021-10-09 MED ORDER — AZITHROMYCIN 500 MG PO TABS
500.0000 mg | ORAL_TABLET | Freq: Once | ORAL | Status: AC
  Administered 2021-10-09: 500 mg via ORAL
  Filled 2021-10-09: qty 1

## 2021-10-09 MED ORDER — AMOXICILLIN-POT CLAVULANATE 875-125 MG PO TABS: 1.0000 | ORAL_TABLET | Freq: Two times a day (BID) | ORAL | 0 refills | Status: AC

## 2021-10-09 MED ORDER — AMOXICILLIN-POT CLAVULANATE 875-125 MG PO TABS
1.0000 | ORAL_TABLET | Freq: Once | ORAL | Status: AC
  Administered 2021-10-09: 1 via ORAL
  Filled 2021-10-09: qty 1

## 2021-10-09 NOTE — Discharge Instructions (Addendum)
Take the 2 antibiotics as directed.  Take the cough medicine as needed.  Follow-up with your primary provider or return to the ED if needed.

## 2021-10-09 NOTE — ED Provider Notes (Signed)
[facility] Emergency Department Provider Note     Event Date/Time   First MD Initiated Contact with Patient 10/09/21 1207     (approximate)   History   Generalized Body Aches   HPI  Miguel [name] is a 71 y.o. male with a history of schizophrenia and stroke, presents to the ED via EMS from home.  Patient's complaints include generalized body aches for the last 2 weeks as well as head congestion, sinus symptoms, and a intermittently productive cough.  Physical Exam   Triage Vital Signs: ED Triage Vitals  Enc Vitals Group     BP 10/09/21 1155 (!) 145/74     Pulse Rate 10/09/21 1155 99     Resp 10/09/21 1155 18     Temp 10/09/21 1155 98.4 F (36.9 C)     Temp Source 10/09/21 1155 Oral     SpO2 10/09/21 1155 96 %     Weight 10/09/21 1222 205 lb 14.6 oz (93.4 kg)     Height 10/09/21 1222 6' (1.829 m)     Head Circumference --      Peak Flow --      Pain Score 10/09/21 1121 3     Pain Loc --      Pain Edu? --      Excl. in GC? --     Most recent vital signs: Vitals:   10/09/21 1155  BP: (!) 145/74  Pulse: 99  Resp: 18  Temp: 98.4 F (36.9 C)  SpO2: 96%    General Awake, no distress.  HEENT NCAT. PERRL. EOMI. No rhinorrhea. Mucous membranes are moist.  CV:  Good peripheral perfusion.  RESP:  Normal effort. Rhonchi noted bilaterally ABD:  No distention.   ED Results / Procedures / Treatments   Labs (all labs ordered are listed, but only abnormal results are displayed) Labs Reviewed  RESP PANEL BY RT-PCR (FLU A&B, COVID) [id number]     EKG   RADIOLOGY  I personally viewed and evaluated these images as part of my medical decision making, as well as reviewing the written report by the radiologist.  ED Provider Interpretation: no acute findings}  DG Chest 2 View  Result Date: 10/09/2021 CLINICAL DATA:  2 week history of cough. EXAM: CHEST - 2 VIEW COMPARISON:  05/27/2015 FINDINGS: No evidence of pulmonary edema or  pneumothorax. Streaky opacity at the right cardiophrenic angle is likely atelectasis or scarring. Subtle asymmetric nodular haziness identified at the left base. The cardiopericardial silhouette is within normal limits for size. The visualized bony structures of the thorax show no acute abnormality. IMPRESSION: Subtle asymmetric nodular haziness at the left base. Features may reflect slight rotation although process such as atypical infection not entirely excluded. Chest CT could be used to further evaluate as clinically warranted. Electronically Signed   By: Kennith Center M.D.   On: 10/09/2021 12:18     PROCEDURES:  Critical Care performed: No  Procedures   MEDICATIONS ORDERED IN ED: Medications  azithromycin (ZITHROMAX) tablet 500 mg (500 mg Oral Given 10/09/21 1315)  amoxicillin-clavulanate (AUGMENTIN) 875-125 MG per tablet 1 tablet (1 tablet Oral Given 10/09/21 1315)     IMPRESSION / MDM / ASSESSMENT AND PLAN / ED COURSE  I reviewed the triage vital signs and the nursing notes.                              Differential diagnosis includes, but  is not limited to, CAP, viral URI, influenza, Covid  Patient presents to the ED for evaluation of 2 weeks of cough, congestion, and body aches.  His viral panel screen is negative for COVID or flu.  Further evaluation with x-ray reveals some concerning changes for possible early pneumonia or atypical pneumonia, based on my review of images and radiologist report.  Patient's diagnosis is consistent with community acquired pneumonia, he will be treated empirically based on his lab findings and persistent symptoms.  Patient is otherwise stable for discharge and outpatient management as he is without tachycardia, tachypnea, or toxic appearance.  Patient will be discharged home with prescriptions for Augmentin and azithromycin. Patient is to follow up with his PCP or local urgent care as needed or otherwise directed. Patient is given ED precautions to  return to the ED for any worsening or new symptoms.   FINAL CLINICAL IMPRESSION(S) / ED DIAGNOSES   Final diagnoses:  Community acquired pneumonia of left lower lobe of lung     Rx / DC Orders   ED Discharge Orders          Ordered    azithromycin (ZITHROMAX Z-PAK) 250 MG tablet  Daily        10/09/21 1333    amoxicillin-clavulanate (AUGMENTIN) 875-125 MG tablet  2 times daily        10/09/21 1333    benzonatate (TESSALON PERLES) 100 MG capsule        10/09/21 1333             Note:  This document was prepared using Dragon voice recognition software and may include unintentional dictation errors.    Lissa Hoard, PA-C 10/09/21 1902    Merwyn Katos, MD 10/10/21 [phone]

## 2021-10-09 NOTE — ED Triage Notes (Signed)
Pt comes with c/o generalized body aches via EMs. EMs reports body aches for two weeks, 1 week of head congestion. Some dry colored sputum.  BP-176/98 T-98.1 HR-100 96% RA

## 2021-12-09 ENCOUNTER — Emergency Department: Payer: Medicare Other

## 2021-12-09 ENCOUNTER — Emergency Department
Admission: EM | Admit: 2021-12-09 | Discharge: 2021-12-09 | Disposition: A | Discharge status: Home / Self Care | Payer: Medicare Other | Attending: Student in an Organized Health Care Education/Training Program | Admitting: Student in an Organized Health Care Education/Training Program

## 2021-12-09 DIAGNOSIS — F1490 Cocaine use, unspecified, uncomplicated: Secondary | ICD-10-CM | POA: Diagnosis not present

## 2021-12-09 DIAGNOSIS — R778 Other specified abnormalities of plasma proteins: Secondary | ICD-10-CM | POA: Diagnosis not present

## 2021-12-09 DIAGNOSIS — R109 Unspecified abdominal pain: Secondary | ICD-10-CM | POA: Insufficient documentation

## 2021-12-09 DIAGNOSIS — I10 Essential (primary) hypertension: Secondary | ICD-10-CM | POA: Insufficient documentation

## 2021-12-09 DIAGNOSIS — M545 Low back pain, unspecified: Secondary | ICD-10-CM | POA: Insufficient documentation

## 2021-12-09 DIAGNOSIS — R0789 Other chest pain: Secondary | ICD-10-CM

## 2021-12-09 LAB — CBC
HCT: 42.8 % (ref 39.0–52.0)
Hemoglobin: 13.5 g/dL (ref 13.0–17.0)
MCH: 29.7 pg (ref 26.0–34.0)
MCHC: 31.5 g/dL (ref 30.0–36.0)
MCV: 94.3 fL (ref 80.0–100.0)
Platelets: 514 10*3/uL — ABNORMAL HIGH (ref 150–400)
RBC: 4.54 MIL/uL (ref 4.22–5.81)
RDW: 14.4 % (ref 11.5–15.5)
WBC: 6.7 10*3/uL (ref 4.0–10.5)
nRBC: 0 % (ref 0.0–0.2)

## 2021-12-09 LAB — HEPATIC FUNCTION PANEL
ALT: 15 U/L (ref 0–44)
AST: 30 U/L (ref 15–41)
Albumin: 3.5 g/dL (ref 3.5–5.0)
Alkaline Phosphatase: 51 U/L (ref 38–126)
Bilirubin, Direct: 0.2 mg/dL (ref 0.0–0.2)
Indirect Bilirubin: 0.8 mg/dL (ref 0.3–0.9)
Total Bilirubin: 1 mg/dL (ref 0.3–1.2)
Total Protein: 7 g/dL (ref 6.5–8.1)

## 2021-12-09 LAB — BASIC METABOLIC PANEL
Anion gap: 8 (ref 5–15)
BUN: 12 mg/dL (ref 8–23)
CO2: 27 mmol/L (ref 22–32)
Calcium: 8.2 mg/dL — ABNORMAL LOW (ref 8.9–10.3)
Chloride: 103 mmol/L (ref 98–111)
Creatinine, Ser: 1.11 mg/dL (ref 0.61–1.24)
GFR, Estimated: >60 mL/min (ref >60)
Glucose, Bld: 95 mg/dL (ref 70–99)
Potassium: 4.3 mmol/L (ref 3.5–5.1)
Sodium: 138 mmol/L (ref 135–145)

## 2021-12-09 LAB — LIPASE, BLOOD: Lipase: 34 U/L (ref 11–51)

## 2021-12-09 LAB — TROPONIN I (HIGH SENSITIVITY)
Troponin I (High Sensitivity): 30 ng/L — ABNORMAL HIGH (ref <18)
Troponin I (High Sensitivity): 29 ng/L — ABNORMAL HIGH (ref <18)

## 2021-12-09 MED ORDER — FUROSEMIDE 10 MG/ML IJ SOLN
40.0000 mg | Freq: Once | INTRAMUSCULAR | Status: AC
  Administered 2021-12-09: 40 mg via INTRAVENOUS
  Filled 2021-12-09: qty 4

## 2021-12-09 NOTE — ED Triage Notes (Signed)
Pt arrived to ED via EMS [place] where he was having chest pain. Per EMS pt is a chronic cocaine smoker that is daily use. Sometimes more use that other days. Pt also drink caffeine very regular also. Pt received nitro spray by EMS and sts that helped his chest pain. ?

## 2021-12-09 NOTE — ED Provider Notes (Signed)
? ?[facility] ?Provider Note ? ? ? Event Date/Time  ? First MD Initiated Contact with Patient 12/09/21 1155   ?  (approximate) ? ? ?History  ? ?Chest Pain ? ? ?HPI ? ?Miguel [name] is a 71 y.o. 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  He is currently pain-free.  States he was last using around 1 AM.  Was feeling anxious denies any shortness of breath at this time.  No nausea or vomiting.  Also complaining of some low back pain.  Denies any history of kidney stone. ?  ? ? ?Physical Exam  ? ?Triage Vital Signs: ?ED Triage Vitals [12/09/21 1157]  ?Enc Vitals Group  ?   BP (!) 145/89  ?   Pulse Rate 89  ?   Resp (!) 25  ?   Temp 98.5 ?F (36.9 ?C)  ?   Temp Source Oral  ?   SpO2 93 %  ?   Weight 185 lb (83.9 kg)  ?   Height   ?   Head Circumference   ?   Peak Flow   ?   Pain Score   ?   Pain Loc   ?   Pain Edu?   ?   Excl. in GC?   ? ? ?Most recent vital signs: ?Vitals:  ? 12/09/21 1345 12/09/21 1400  ?BP:  (!) 163/115  ?Pulse: 82 87  ?Resp: 19 (!) 21  ?Temp:    ?SpO2: 98% 94%  ? ? ? ?Constitutional: Alert  ?Eyes: Conjunctivae are normal.  ?Head: Atraumatic. ?Nose: No congestion/rhinnorhea. ?Mouth/Throat: Mucous membranes are moist.   ?Neck: Painless ROM.  ?Cardiovascular:   Good peripheral circulation. No mg/r/ ?Respiratory: Normal respiratory effort.  No retractions. No crackles  ?Gastrointestinal: Soft and nontender.  ?Musculoskeletal:  no deformity ?Neurologic:  MAE spontaneously. No gross focal neurologic deficits are appreciated.  ?Skin:  Skin is warm, dry and intact. No rash noted. ?Psychiatric: Mood and affect are normal. Speech and behavior are normal. ? ? ? ?ED Results / Procedures / Treatments  ? ?Labs ?(all labs ordered are listed, but only abnormal results are displayed) ?Labs Reviewed  ?BASIC METABOLIC PANEL - Abnormal; Notable for the following components:  ?     Result Value  ? Calcium 8.2 (*)   ? All other components within normal limits  ?CBC - Abnormal; Notable for the following components:  ? Platelets 514 (*)   ? All other components within normal limits  ?TROPONIN I (HIGH SENSITIVITY) - Abnormal; Notable for the following components:  ? Troponin I (High Sensitivity) 30 (*)   ? All other components within normal limits  ?TROPONIN I (HIGH SENSITIVITY) - Abnormal; Notable for the following components:  ? Troponin I (High Sensitivity) 29 (*)   ? All other components within normal limits  ?HEPATIC FUNCTION PANEL  ?LIPASE, BLOOD  ? ? ? ?EKG ? ?ED ECG REPORT ?I, Willy Eddy, the attending physician, personally viewed and interpreted this ECG. ? ? Date: 12/09/2021 ? EKG Time: 11:54 ? Rate: 90 ? Rhythm: sinus ? Axis: normal ? Intervals:borderline prolonged qt ? ST&T Change: no stemi, no depressions ? ? ? ?RADIOLOGY ?Please see ED Course for my review and interpretation. ? ?I personally reviewed all radiographic images ordered to evaluate for the above acute complaints and reviewed radiology reports and findings.  These findings were personally discussed with the  patient.  Please see medical record for radiology report. ? ? ? ?PROCEDURES: ? ?Critical Care performed: No ? ?Procedures ? ? ?MEDICATIONS ORDERED IN ED: ?Medications  ?furosemide (LASIX) injection 40 mg (has no administration in time range)  ? ? ? ?IMPRESSION / MDM / ASSESSMENT AND PLAN / ED COURSE  ?I reviewed the triage vital signs and the nursing notes. ?             ?               ? ?Differential diagnosis includes, but is not limited to, ACS, bronchitis, aspiration, stone, AAA, colitis, substance abuse, ? ?Patient presented to ER with symptoms as described after crack cocaine use.  Nontoxic-appearing protecting his airway mildly hypertensive not febrile no hypoxia.  Exam is reassuring.  Is complaining for some low left flank pain.  Possible stone will order CT to rule out AAA.  Doubt dissection.  EKG is  nonischemic initial troponin borderline.  We will continue observe. ? ? ?Clinical Course as of 12/09/21 1512  ?Sat Dec 09, 2021  ?1231 Chest x-ray by my review and interpretation does not show evidence of pneumothorax. [PR]  ?1447 Opponent and downtrending.  Possibly related to crack cocaine not consistent with ACS. [PR]  ?1453 CT Imaging by my review and interpretation does not show any evidence of obstruction or AAA. [PR]  ?1508 CT with no acute intra-abdominal process does have some effusion and pulmonary vascular congestion with his hypertension elevated troponin will give dose of Lasix.  He is not hypoxic.  Patient will be signed out to oncoming physician.  Anticipate will still be appropriate for discharge home after period of observation after diuresis. [PR]  ?  ?Clinical Course User Index ?[PR] Willy Eddy, MD  ? ? ? ?FINAL CLINICAL IMPRESSION(S) / ED DIAGNOSES  ? ?Final diagnoses:  ?Atypical chest pain  ? ? ? ?Rx / DC Orders  ? ?ED Discharge Orders   ? ? None  ? ?  ? ? ? ?Note:  This document was prepared using Dragon voice recognition software and may include unintentional dictation errors. ? ?  ?Willy Eddy, MD ?12/09/21 1512 ? ?

## 2022-02-19 ENCOUNTER — Other Ambulatory Visit: Payer: Self-pay

## 2022-02-19 ENCOUNTER — Emergency Department
Admission: EM | Admit: 2022-02-19 | Discharge: 2022-02-19 | Disposition: A | Discharge status: Home / Self Care | Payer: Medicare Other | Attending: Emergency Medicine | Admitting: Emergency Medicine

## 2022-02-19 DIAGNOSIS — R609 Edema, unspecified: Secondary | ICD-10-CM

## 2022-02-19 DIAGNOSIS — R7989 Other specified abnormal findings of blood chemistry: Secondary | ICD-10-CM | POA: Insufficient documentation

## 2022-02-19 DIAGNOSIS — M7989 Other specified soft tissue disorders: Secondary | ICD-10-CM | POA: Diagnosis present

## 2022-02-19 DIAGNOSIS — R600 Localized edema: Secondary | ICD-10-CM | POA: Diagnosis not present

## 2022-02-19 LAB — BRAIN NATRIURETIC PEPTIDE: B Natriuretic Peptide: 144 pg/mL — ABNORMAL HIGH (ref 0.0–100.0)

## 2022-02-19 LAB — CBC WITH DIFFERENTIAL/PLATELET
Abs Immature Granulocytes: 0.02 10*3/uL (ref 0.00–0.07)
Basophils Absolute: 0.1 10*3/uL (ref 0.0–0.1)
Basophils Relative: 1 %
Eosinophils Absolute: 0.1 10*3/uL (ref 0.0–0.5)
Eosinophils Relative: 2 %
HCT: 40.8 % (ref 39.0–52.0)
Hemoglobin: 12.7 g/dL — ABNORMAL LOW (ref 13.0–17.0)
Immature Granulocytes: 0 %
Lymphocytes Relative: 29 %
Lymphs Abs: 1.9 10*3/uL (ref 0.7–4.0)
MCH: 30.1 pg (ref 26.0–34.0)
MCHC: 31.1 g/dL (ref 30.0–36.0)
MCV: 96.7 fL (ref 80.0–100.0)
Monocytes Absolute: 0.8 10*3/uL (ref 0.1–1.0)
Monocytes Relative: 12 %
Neutro Abs: 3.7 10*3/uL (ref 1.7–7.7)
Neutrophils Relative %: 56 %
Platelets: 486 10*3/uL — ABNORMAL HIGH (ref 150–400)
RBC: 4.22 MIL/uL (ref 4.22–5.81)
RDW: 15.9 % — ABNORMAL HIGH (ref 11.5–15.5)
WBC: 6.5 10*3/uL (ref 4.0–10.5)
nRBC: 0 % (ref 0.0–0.2)

## 2022-02-19 LAB — COMPREHENSIVE METABOLIC PANEL
ALT: 15 U/L (ref 0–44)
AST: 24 U/L (ref 15–41)
Albumin: 3.4 g/dL — ABNORMAL LOW (ref 3.5–5.0)
Alkaline Phosphatase: 58 U/L (ref 38–126)
Anion gap: 7 (ref 5–15)
BUN: 11 mg/dL (ref 8–23)
CO2: 32 mmol/L (ref 22–32)
Calcium: 8.7 mg/dL — ABNORMAL LOW (ref 8.9–10.3)
Chloride: 100 mmol/L (ref 98–111)
Creatinine, Ser: 1.21 mg/dL (ref 0.61–1.24)
GFR, Estimated: >60 mL/min (ref >60)
Glucose, Bld: 88 mg/dL (ref 70–99)
Potassium: 3.9 mmol/L (ref 3.5–5.1)
Sodium: 139 mmol/L (ref 135–145)
Total Bilirubin: 0.6 mg/dL (ref 0.3–1.2)
Total Protein: 7.4 g/dL (ref 6.5–8.1)

## 2022-02-19 MED ORDER — FUROSEMIDE 20 MG PO TABS: 20.0000 mg | ORAL_TABLET | Freq: Every day | ORAL | 1 refills | Status: DC

## 2022-02-19 MED ORDER — FUROSEMIDE 40 MG PO TABS
40.0000 mg | ORAL_TABLET | Freq: Once | ORAL | Status: AC
  Administered 2022-02-19: 40 mg via ORAL
  Filled 2022-02-19: qty 1

## 2022-02-19 NOTE — ED Triage Notes (Signed)
Pt presents via POV c/o bilateral leg swelling x2 days. Denies injury. Reports does not see a MD regularly.

## 2022-02-20 ENCOUNTER — Telehealth: Payer: Self-pay | Admitting: Family

## 2022-03-08 ENCOUNTER — Telehealth: Payer: Self-pay | Admitting: Family

## 2022-03-08 NOTE — Telephone Encounter (Signed)
Called in attempt to get a new patient appointment scheduled for patient after receiving a referral from the ER but was unable to reach him.   Yanel Dombrosky, NT

## 2022-06-04 ENCOUNTER — Emergency Department
Admission: EM | Admit: 2022-06-04 | Discharge: 2022-06-04 | Disposition: A | Discharge status: Home / Self Care | Payer: Medicare Other

## 2022-06-19 ENCOUNTER — Emergency Department: Payer: Medicare Other

## 2022-06-19 ENCOUNTER — Emergency Department
Admission: EM | Admit: 2022-06-19 | Discharge: 2022-06-19 | Disposition: A | Discharge status: Home / Self Care | Payer: Medicare Other | Attending: Emergency Medicine | Admitting: Emergency Medicine

## 2022-06-19 ENCOUNTER — Other Ambulatory Visit: Payer: Self-pay

## 2022-06-19 DIAGNOSIS — M7989 Other specified soft tissue disorders: Secondary | ICD-10-CM | POA: Diagnosis not present

## 2022-06-19 DIAGNOSIS — R790 Abnormal level of blood mineral: Secondary | ICD-10-CM | POA: Insufficient documentation

## 2022-06-19 DIAGNOSIS — Z1152 Encounter for screening for COVID-19: Secondary | ICD-10-CM | POA: Insufficient documentation

## 2022-06-19 DIAGNOSIS — I509 Heart failure, unspecified: Secondary | ICD-10-CM | POA: Insufficient documentation

## 2022-06-19 DIAGNOSIS — R0602 Shortness of breath: Secondary | ICD-10-CM | POA: Insufficient documentation

## 2022-06-19 DIAGNOSIS — Z8673 Personal history of transient ischemic attack (TIA), and cerebral infarction without residual deficits: Secondary | ICD-10-CM | POA: Diagnosis not present

## 2022-06-19 DIAGNOSIS — R000 Tachycardia, unspecified: Secondary | ICD-10-CM | POA: Insufficient documentation

## 2022-06-19 DIAGNOSIS — R531 Weakness: Secondary | ICD-10-CM | POA: Diagnosis present

## 2022-06-19 LAB — RESP PANEL BY RT-PCR (FLU A&B, COVID) ARPGX2
Influenza A by PCR: NEGATIVE
Influenza B by PCR: NEGATIVE
SARS Coronavirus 2 by RT PCR: NEGATIVE

## 2022-06-19 LAB — BASIC METABOLIC PANEL
Anion gap: 8 (ref 5–15)
BUN: 13 mg/dL (ref 8–23)
CO2: 32 mmol/L (ref 22–32)
Calcium: 8.8 mg/dL — ABNORMAL LOW (ref 8.9–10.3)
Chloride: 101 mmol/L (ref 98–111)
Creatinine, Ser: 1.13 mg/dL (ref 0.61–1.24)
GFR, Estimated: >60 mL/min (ref >60)
Glucose, Bld: 105 mg/dL — ABNORMAL HIGH (ref 70–99)
Potassium: 3.5 mmol/L (ref 3.5–5.1)
Sodium: 141 mmol/L (ref 135–145)

## 2022-06-19 LAB — CBC
HCT: 44.9 % (ref 39.0–52.0)
Hemoglobin: 14.2 g/dL (ref 13.0–17.0)
MCH: 30.6 pg (ref 26.0–34.0)
MCHC: 31.6 g/dL (ref 30.0–36.0)
MCV: 96.8 fL (ref 80.0–100.0)
Platelets: 495 10*3/uL — ABNORMAL HIGH (ref 150–400)
RBC: 4.64 MIL/uL (ref 4.22–5.81)
RDW: 14.6 % (ref 11.5–15.5)
WBC: 8 10*3/uL (ref 4.0–10.5)
nRBC: 0 % (ref 0.0–0.2)

## 2022-06-19 LAB — URINALYSIS, ROUTINE W REFLEX MICROSCOPIC
Bacteria, UA: NONE SEEN
Bilirubin Urine: NEGATIVE
Glucose, UA: NEGATIVE mg/dL
Hgb urine dipstick: NEGATIVE
Ketones, ur: NEGATIVE mg/dL
Leukocytes,Ua: NEGATIVE
Nitrite: NEGATIVE
Protein, ur: 30 mg/dL — AB
Specific Gravity, Urine: 1.013 (ref 1.005–1.030)
Squamous Epithelial / HPF: NONE SEEN (ref 0–5)
pH: 6 (ref 5.0–8.0)

## 2022-06-19 LAB — BRAIN NATRIURETIC PEPTIDE: B Natriuretic Peptide: 723.9 pg/mL — ABNORMAL HIGH (ref 0.0–100.0)

## 2022-06-19 MED ORDER — FUROSEMIDE 10 MG/ML IJ SOLN
40.0000 mg | Freq: Once | INTRAMUSCULAR | Status: DC
  Filled 2022-06-19: qty 4

## 2022-06-19 MED ORDER — FUROSEMIDE 40 MG PO TABS: 40.0000 mg | ORAL_TABLET | Freq: Every day | ORAL | 11 refills | Status: DC

## 2022-06-19 NOTE — ED Triage Notes (Signed)
First Nurse: Pt here via [facility] with weakness. Pt denies SOB x1 month. Pt has hx of asthma.   91% RA, 98% on 3L 98.1 98 SR 138/105

## 2022-06-19 NOTE — ED Provider Notes (Signed)
[facility] Provider Note    Event Date/Time   First MD Initiated Contact with Patient 06/19/22 1139     (approximate)   History   Chief Complaint Weakness   HPI Miguel [name] is a 71 y.o. male, history of schizophrenia, stroke, presents to the emergency department for evaluation of weakness and "chest congestion".  He states that he has had intermittent swelling in both of his legs over the past few months.  Since then, he has also had increased congestion/fluid in his lungs he states is bothering him.  He additionally states that he feels weak persistently throughout the day, particularly with exertion.    Denies fever/chills, chest pain, shortness of breath, abdominal pain, flank pain, nausea/vomiting, diarrhea, dysuria, rash/lesions, numb/tingling upper or lower extremities, vertigo, or dizziness/lightheadedness.  History Limitations: No limitations.        Physical Exam  Triage Vital Signs: ED Triage Vitals  Enc Vitals Group     BP 06/19/22 1110 135/67     Pulse Rate 06/19/22 1110 96     Resp 06/19/22 1110 18     Temp 06/19/22 1110 97.7 F (36.5 C)     Temp Source 06/19/22 1110 Oral     SpO2 06/19/22 1110 95 %     Weight 06/19/22 1112 212 lb 1.3 oz (96.2 kg)     Height 06/19/22 1112 6' (1.829 m)     Head Circumference --      Peak Flow --      Pain Score 06/19/22 1111 0     Pain Loc --      Pain Edu? --      Excl. in GC? --     Most recent vital signs: Vitals:   06/19/22 1110 06/19/22 1306  BP: 135/67 132/68  Pulse: 96 94  Resp: 18 18  Temp: 97.7 F (36.5 C) 98 F (36.7 C)  SpO2: 95% 96%    General: Awake, NAD.  Skin: Warm, dry. No rashes or lesions.  Eyes: PERRL. Conjunctivae normal.  CV: Good peripheral perfusion.  S1 and S2 present.  No murmurs, rubs, or gallops. Resp: Normal effort.  Mild rales appreciated bilaterally in the lung bases. Abd: Soft, non-tender. No distention.  Neuro: At baseline. No gross neurological  deficits.  Musculoskeletal: Normal ROM of all extremities.  Focused Exam: Mild swelling appreciated in the lower extremities bilaterally, approximately 1+ pitting edema.  Physical Exam    ED Results / Procedures / Treatments  Labs (all labs ordered are listed, but only abnormal results are displayed) Labs Reviewed  BASIC METABOLIC PANEL - Abnormal; Notable for the following components:      Result Value   Glucose, Bld 105 (*)    Calcium 8.8 (*)    All other components within normal limits  CBC - Abnormal; Notable for the following components:   Platelets 495 (*)    All other components within normal limits  URINALYSIS, ROUTINE W REFLEX MICROSCOPIC - Abnormal; Notable for the following components:   Color, Urine YELLOW (*)    APPearance CLEAR (*)    Protein, ur 30 (*)    All other components within normal limits  BRAIN NATRIURETIC PEPTIDE - Abnormal; Notable for the following components:   B Natriuretic Peptide 723.9 (*)    All other components within normal limits  RESP PANEL BY RT-PCR (FLU A&B, COVID) ARPGX2  CBG MONITORING, ED     EKG Sinus tachycardia, rate of 101, intermittent PVCs present, no T-segment changes,  right axis deviation present, no QT prolongation, normal QRS interval.    RADIOLOGY  ED Provider Interpretation: I personally reviewed and interpreted this x-ray, small bilateral pleural effusions present.  DG Chest 2 View  Result Date: 06/19/2022 CLINICAL DATA:  Cough.  Weakness.  Short of breath. EXAM: CHEST - 2 VIEW COMPARISON:  12/09/2021 and older exams. FINDINGS: Cardiac silhouette is normal in size. No mediastinal or hilar masses. No evidence of adenopathy. Bilateral interstitial thickening, most evident in the lower lungs. No lung consolidation. Small bilateral pleural effusions.  No pneumothorax. Skeletal structures are intact. IMPRESSION: 1. Findings similar to the most recent prior exam. Small effusions with bilateral interstitial thickening  consistent with mild interstitial pulmonary edema-CHF suspected. Electronically Signed   By: Lajean Manes M.D.   On: 06/19/2022 11:49    PROCEDURES:  Critical Care performed: N/A.  Procedures    MEDICATIONS ORDERED IN ED: Medications  furosemide (LASIX) injection 40 mg (has no administration in time range)     IMPRESSION / MDM / ASSESSMENT AND PLAN / ED COURSE  I reviewed the triage vital signs and the nursing notes.                              Differential diagnosis includes, but is not limited to, heart failure, ACS, UTI, anemia, pleural effusion, pulmonary edema, COVID-19, influenza, commune acquired pneumonia.  ED Course Patient appears well, vitals within normal limits.  NAD.  CBC shows no leukocytosis or anemia.  BMP shows no electrolyte abnormalities or AKI.  Urinalysis shows no evidence of infection or hematuria.  Respiratory panel negative for COVID-19 or influenza.  BNP notably elevated at 723.9, elevated from 144 approximately four months prior.  Assessment/Plan Patient presents with persistent weakness and reported chest congestion.  Patient appears well clinically.  Symptoms have been persistent for the past few months.  Vitals are within normal limits.  He does have small bilateral pleural effusions noted on chest x-ray, as well as an elevated BNP at 723.9.  No evidence of pneumonia.  Lab work-up otherwise appears normal.  Respiratory panel negative for COVID-19 or influenza.  Suspect likely congestive heart failure, though he is clinically stable at this time.  Patient expressed discontent with IV diuresis here when speaking with Dr. Corky Downs.  He was provided with prescription for p.o. furosemide and provided with a referral to the heart failure clinic.  Considered admission for this patient, but given his stable presentation and access to follow-up, he is unlikely to benefit from admission.  Provided the patient with anticipatory guidance, return precautions,  and educational material. Encouraged the patient to return to the emergency department at any time if they begin to experience any new or worsening symptoms. Patient expressed understanding and agreed with the plan.   Patient's presentation is most consistent with acute illness / injury with system symptoms.       FINAL CLINICAL IMPRESSION(S) / ED DIAGNOSES   Final diagnoses:  Chronic congestive heart failure, unspecified heart failure type ([place])     Rx / DC Orders   ED Discharge Orders          Ordered    furosemide (LASIX) 40 MG tablet  Daily        06/19/22 1256             Note:  This document was prepared using Dragon voice recognition software and may include unintentional dictation errors.   Teodoro Spray,  PA 06/19/22 1404    Lavonia Drafts, MD 06/19/22 1429

## 2022-06-19 NOTE — ED Notes (Signed)
Patient in x-ray at this time , they will bring him to room 44 after test.

## 2022-06-20 ENCOUNTER — Telehealth: Payer: Self-pay | Admitting: Family

## 2022-06-20 NOTE — Telephone Encounter (Signed)
Called in attempt to schedule patient for a new patient appointment but was unable to reach or leave voicemail.   Shiraz Bastyr, NT

## 2022-06-24 NOTE — Progress Notes (Deleted)
   Patient ID: Miguel [name], male    DOB: 1950-09-17, 71 y.o.   MRN: [id number]  HPI  Miguel [name] is a 71 y/o male with a history of  Echo report from 07/13/18 showed an EF of 25-30% along with mild LVH, mild Miguel and diffuse hypokinesis.   Was in the ED 06/19/22 due to weakness, chest congestion, SOB due to HF exacerbation. Small bilateral pleural effusions noted on chest x-ray. Patient refused IV lasix. Oral lasix was increased and she was released. Was in the ED 06/04/22 but LWBS.   He presents today for his initial visit with a chief complaint of   Review of Systems    Physical Exam    Assessment & Plan:  1: Chronic heart failure with reduced ejection fraction- - NYHA class - on GDMT of  - BNP 06/19/22 was 723.9  2: HTN- - BP - BMP 06/19/22 reviewed and showed sodium 141, potassium 3.5, creatinine 1.13 and GFR >60

## 2022-06-26 ENCOUNTER — Telehealth: Payer: Self-pay | Admitting: Family

## 2022-06-26 ENCOUNTER — Ambulatory Visit: Payer: Medicare Other | Admitting: Family

## 2022-06-26 NOTE — Telephone Encounter (Signed)
Patient did not show for his initial Heart Failure Clinic appointment on 06/26/22. Will attempt to reschedule.   

## 2022-09-10 ENCOUNTER — Inpatient Hospital Stay: Payer: Medicare Other

## 2022-09-10 ENCOUNTER — Emergency Department: Payer: Medicare Other

## 2022-09-10 ENCOUNTER — Inpatient Hospital Stay
Admission: EM | Admit: 2022-09-10 | Discharge: 2022-09-15 | DRG: 917 | Disposition: A | Discharge status: Home / Self Care | Payer: Medicare Other | Attending: Internal Medicine | Admitting: Internal Medicine

## 2022-09-10 DIAGNOSIS — T405X1A Poisoning by cocaine, accidental (unintentional), initial encounter: Principal | ICD-10-CM | POA: Diagnosis present

## 2022-09-10 DIAGNOSIS — Z818 Family history of other mental and behavioral disorders: Secondary | ICD-10-CM | POA: Diagnosis not present

## 2022-09-10 DIAGNOSIS — M6281 Muscle weakness (generalized): Secondary | ICD-10-CM | POA: Diagnosis present

## 2022-09-10 DIAGNOSIS — I952 Hypotension due to drugs: Secondary | ICD-10-CM | POA: Diagnosis present

## 2022-09-10 DIAGNOSIS — I5023 Acute on chronic systolic (congestive) heart failure: Secondary | ICD-10-CM | POA: Diagnosis not present

## 2022-09-10 DIAGNOSIS — I161 Hypertensive emergency: Secondary | ICD-10-CM | POA: Diagnosis present

## 2022-09-10 DIAGNOSIS — F250 Schizoaffective disorder, bipolar type: Secondary | ICD-10-CM | POA: Diagnosis present

## 2022-09-10 DIAGNOSIS — J90 Pleural effusion, not elsewhere classified: Secondary | ICD-10-CM | POA: Diagnosis not present

## 2022-09-10 DIAGNOSIS — Z1152 Encounter for screening for COVID-19: Secondary | ICD-10-CM

## 2022-09-10 DIAGNOSIS — Z79899 Other long term (current) drug therapy: Secondary | ICD-10-CM | POA: Diagnosis not present

## 2022-09-10 DIAGNOSIS — Z7901 Long term (current) use of anticoagulants: Secondary | ICD-10-CM | POA: Diagnosis not present

## 2022-09-10 DIAGNOSIS — E162 Hypoglycemia, unspecified: Secondary | ICD-10-CM | POA: Diagnosis present

## 2022-09-10 DIAGNOSIS — J918 Pleural effusion in other conditions classified elsewhere: Secondary | ICD-10-CM | POA: Diagnosis present

## 2022-09-10 DIAGNOSIS — J9600 Acute respiratory failure, unspecified whether with hypoxia or hypercapnia: Secondary | ICD-10-CM | POA: Diagnosis present

## 2022-09-10 DIAGNOSIS — I69320 Aphasia following cerebral infarction: Secondary | ICD-10-CM

## 2022-09-10 DIAGNOSIS — F419 Anxiety disorder, unspecified: Secondary | ICD-10-CM | POA: Diagnosis present

## 2022-09-10 DIAGNOSIS — J9801 Acute bronchospasm: Principal | ICD-10-CM

## 2022-09-10 DIAGNOSIS — I2489 Other forms of acute ischemic heart disease: Secondary | ICD-10-CM | POA: Diagnosis present

## 2022-09-10 DIAGNOSIS — J9601 Acute respiratory failure with hypoxia: Secondary | ICD-10-CM | POA: Diagnosis present

## 2022-09-10 DIAGNOSIS — F1410 Cocaine abuse, uncomplicated: Secondary | ICD-10-CM | POA: Diagnosis present

## 2022-09-10 DIAGNOSIS — I5043 Acute on chronic combined systolic (congestive) and diastolic (congestive) heart failure: Secondary | ICD-10-CM | POA: Diagnosis present

## 2022-09-10 DIAGNOSIS — T426X5A Adverse effect of other antiepileptic and sedative-hypnotic drugs, initial encounter: Secondary | ICD-10-CM | POA: Diagnosis present

## 2022-09-10 DIAGNOSIS — Z91148 Patient's other noncompliance with medication regimen for other reason: Secondary | ICD-10-CM

## 2022-09-10 DIAGNOSIS — I110 Hypertensive heart disease with heart failure: Secondary | ICD-10-CM | POA: Diagnosis present

## 2022-09-10 DIAGNOSIS — F17210 Nicotine dependence, cigarettes, uncomplicated: Secondary | ICD-10-CM | POA: Diagnosis present

## 2022-09-10 DIAGNOSIS — R0603 Acute respiratory distress: Secondary | ICD-10-CM | POA: Diagnosis not present

## 2022-09-10 DIAGNOSIS — R680 Hypothermia, not associated with low environmental temperature: Secondary | ICD-10-CM | POA: Diagnosis present

## 2022-09-10 DIAGNOSIS — F209 Schizophrenia, unspecified: Secondary | ICD-10-CM | POA: Diagnosis present

## 2022-09-10 DIAGNOSIS — R079 Chest pain, unspecified: Secondary | ICD-10-CM

## 2022-09-10 LAB — CBC WITH DIFFERENTIAL/PLATELET
Abs Immature Granulocytes: 0.04 10*3/uL (ref 0.00–0.07)
Basophils Absolute: 0.1 10*3/uL (ref 0.0–0.1)
Basophils Relative: 1 %
Eosinophils Absolute: 0.1 10*3/uL (ref 0.0–0.5)
Eosinophils Relative: 1 %
HCT: 44.4 % (ref 39.0–52.0)
Hemoglobin: 14.3 g/dL (ref 13.0–17.0)
Immature Granulocytes: 1 %
Lymphocytes Relative: 30 %
Lymphs Abs: 2.5 10*3/uL (ref 0.7–4.0)
MCH: 31.1 pg (ref 26.0–34.0)
MCHC: 32.2 g/dL (ref 30.0–36.0)
MCV: 96.5 fL (ref 80.0–100.0)
Monocytes Absolute: 0.8 10*3/uL (ref 0.1–1.0)
Monocytes Relative: 10 %
Neutro Abs: 4.9 10*3/uL (ref 1.7–7.7)
Neutrophils Relative %: 57 %
Platelets: 505 10*3/uL — ABNORMAL HIGH (ref 150–400)
RBC: 4.6 MIL/uL (ref 4.22–5.81)
RDW: 15 % (ref 11.5–15.5)
WBC: 8.4 10*3/uL (ref 4.0–10.5)
nRBC: 0 % (ref 0.0–0.2)

## 2022-09-10 LAB — URINALYSIS, COMPLETE (UACMP) WITH MICROSCOPIC
Bacteria, UA: NONE SEEN
Bilirubin Urine: NEGATIVE
Glucose, UA: NEGATIVE mg/dL
Ketones, ur: NEGATIVE mg/dL
Leukocytes,Ua: NEGATIVE
Nitrite: NEGATIVE
Protein, ur: NEGATIVE mg/dL
Specific Gravity, Urine: 1.035 — ABNORMAL HIGH (ref 1.005–1.030)
Squamous Epithelial / HPF: NONE SEEN /HPF (ref 0–5)
pH: 6 (ref 5.0–8.0)

## 2022-09-10 LAB — BASIC METABOLIC PANEL
Anion gap: 11 (ref 5–15)
BUN: 14 mg/dL (ref 8–23)
CO2: 27 mmol/L (ref 22–32)
Calcium: 8.4 mg/dL — ABNORMAL LOW (ref 8.9–10.3)
Chloride: 103 mmol/L (ref 98–111)
Creatinine, Ser: 0.94 mg/dL (ref 0.61–1.24)
GFR, Estimated: >60 mL/min (ref >60)
Glucose, Bld: 123 mg/dL — ABNORMAL HIGH (ref 70–99)
Potassium: 3.9 mmol/L (ref 3.5–5.1)
Sodium: 141 mmol/L (ref 135–145)

## 2022-09-10 LAB — BLOOD GAS, ARTERIAL
Acid-Base Excess: 6.9 mmol/L — ABNORMAL HIGH (ref 0.0–2.0)
Bicarbonate: 32 mmol/L — ABNORMAL HIGH (ref 20.0–28.0)
FIO2: 60 %
MECHVT: 500 mL
Mechanical Rate: 18
O2 Saturation: 99.3 %
PEEP: 5 cmH2O
Patient temperature: 37
pCO2 arterial: 46 mmHg (ref 32–48)
pH, Arterial: 7.45 (ref 7.35–7.45)
pO2, Arterial: 121 mmHg — ABNORMAL HIGH (ref 83–108)

## 2022-09-10 LAB — URINE DRUG SCREEN, QUALITATIVE (ARMC ONLY)
Amphetamines, Ur Screen: NOT DETECTED
Barbiturates, Ur Screen: NOT DETECTED
Benzodiazepine, Ur Scrn: NOT DETECTED
Cannabinoid 50 Ng, Ur ~~LOC~~: NOT DETECTED
Cocaine Metabolite,Ur ~~LOC~~: POSITIVE — AB
MDMA (Ecstasy)Ur Screen: NOT DETECTED
Methadone Scn, Ur: NOT DETECTED
Opiate, Ur Screen: NOT DETECTED
Phencyclidine (PCP) Ur S: NOT DETECTED
Tricyclic, Ur Screen: NOT DETECTED

## 2022-09-10 LAB — GLUCOSE, CAPILLARY
Glucose-Capillary: 106 mg/dL — ABNORMAL HIGH (ref 70–99)
Glucose-Capillary: 140 mg/dL — ABNORMAL HIGH (ref 70–99)
Glucose-Capillary: 102 mg/dL — ABNORMAL HIGH (ref 70–99)

## 2022-09-10 LAB — TROPONIN I (HIGH SENSITIVITY)
Troponin I (High Sensitivity): 37 ng/L — ABNORMAL HIGH (ref <18)
Troponin I (High Sensitivity): 45 ng/L — ABNORMAL HIGH (ref <18)
Troponin I (High Sensitivity): 34 ng/L — ABNORMAL HIGH (ref <18)

## 2022-09-10 LAB — CK: Total CK: 158 U/L (ref 49–397)

## 2022-09-10 LAB — LACTIC ACID, PLASMA
Lactic Acid, Venous: 3 mmol/L (ref 0.5–1.9)
Lactic Acid, Venous: 1.9 mmol/L (ref 0.5–1.9)

## 2022-09-10 LAB — ACETAMINOPHEN LEVEL: Acetaminophen (Tylenol), Serum: <10 ug/mL — ABNORMAL LOW (ref 10–30)

## 2022-09-10 LAB — RESP PANEL BY RT-PCR (RSV, FLU A&B, COVID)  RVPGX2
Influenza A by PCR: NEGATIVE
Influenza B by PCR: NEGATIVE
Resp Syncytial Virus by PCR: NEGATIVE
SARS Coronavirus 2 by RT PCR: NEGATIVE

## 2022-09-10 LAB — ALBUMIN: Albumin: 3.8 g/dL (ref 3.5–5.0)

## 2022-09-10 LAB — MRSA NEXT GEN BY PCR, NASAL: MRSA by PCR Next Gen: NOT DETECTED

## 2022-09-10 LAB — PHOSPHORUS: Phosphorus: 4 mg/dL (ref 2.5–4.6)

## 2022-09-10 LAB — MAGNESIUM: Magnesium: 2 mg/dL (ref 1.7–2.4)

## 2022-09-10 LAB — ETHANOL: Alcohol, Ethyl (B): <10 mg/dL (ref <10)

## 2022-09-10 LAB — BRAIN NATRIURETIC PEPTIDE: B Natriuretic Peptide: 713.4 pg/mL — ABNORMAL HIGH (ref 0.0–100.0)

## 2022-09-10 LAB — PROCALCITONIN: Procalcitonin: <0.1 ng/mL

## 2022-09-10 LAB — SALICYLATE LEVEL: Salicylate Lvl: <7 mg/dL — ABNORMAL LOW (ref 7.0–30.0)

## 2022-09-10 LAB — HIV ANTIBODY (ROUTINE TESTING W REFLEX): HIV Screen 4th Generation wRfx: NONREACTIVE

## 2022-09-10 MED ORDER — SODIUM CHLORIDE 0.9 % IV SOLN
250.0000 mL | INTRAVENOUS | Status: DC
  Administered 2022-09-10: 250 mL via INTRAVENOUS

## 2022-09-10 MED ORDER — LORAZEPAM 2 MG/ML IJ SOLN
1.0000 mg | Freq: Once | INTRAMUSCULAR | Status: AC
  Administered 2022-09-10: 1 mg via INTRAVENOUS
  Filled 2022-09-10: qty 1

## 2022-09-10 MED ORDER — ALUM & MAG HYDROXIDE-SIMETH 200-200-20 MG/5ML PO SUSP
30.0000 mL | Freq: Once | ORAL | Status: AC
  Administered 2022-09-10: 30 mL via ORAL
  Filled 2022-09-10: qty 30

## 2022-09-10 MED ORDER — LIDOCAINE VISCOUS HCL 2 % MT SOLN
15.0000 mL | Freq: Once | OROMUCOSAL | Status: AC
  Administered 2022-09-10: 15 mL via OROMUCOSAL
  Filled 2022-09-10: qty 15

## 2022-09-10 MED ORDER — ETOMIDATE 2 MG/ML IV SOLN
INTRAVENOUS | Status: DC | PRN
  Administered 2022-09-10: 20 mg via INTRAVENOUS

## 2022-09-10 MED ORDER — ACETAMINOPHEN 325 MG PO TABS
650.0000 mg | ORAL_TABLET | ORAL | Status: DC | PRN
  Administered 2022-09-14: 650 mg via ORAL
  Filled 2022-09-10: qty 2

## 2022-09-10 MED ORDER — NOREPINEPHRINE 4 MG/250ML-% IV SOLN: 0.0000 ug/min | INTRAVENOUS | Status: DC

## 2022-09-10 MED ORDER — DOCUSATE SODIUM 100 MG PO CAPS: 100.0000 mg | ORAL_CAPSULE | Freq: Two times a day (BID) | ORAL | Status: DC | PRN

## 2022-09-10 MED ORDER — FAMOTIDINE IN NACL 20-0.9 MG/50ML-% IV SOLN
20.0000 mg | Freq: Two times a day (BID) | INTRAVENOUS | Status: DC
  Administered 2022-09-10 – 2022-09-13 (×6): 20 mg via INTRAVENOUS
  Filled 2022-09-10 (×6): qty 50

## 2022-09-10 MED ORDER — CHLORHEXIDINE GLUCONATE CLOTH 2 % EX PADS: 6.0000 | MEDICATED_PAD | Freq: Every day | CUTANEOUS | Status: DC

## 2022-09-10 MED ORDER — IOHEXOL 350 MG/ML SOLN
75.0000 mL | Freq: Once | INTRAVENOUS | Status: AC | PRN
  Administered 2022-09-10: 75 mL via INTRAVENOUS

## 2022-09-10 MED ORDER — SUCCINYLCHOLINE CHLORIDE 20 MG/ML IJ SOLN
INTRAMUSCULAR | Status: DC | PRN
  Administered 2022-09-10: 100 mg via INTRAVENOUS

## 2022-09-10 MED ORDER — LABETALOL HCL 5 MG/ML IV SOLN
10.0000 mg | Freq: Once | INTRAVENOUS | Status: AC
  Administered 2022-09-10: 10 mg via INTRAVENOUS
  Filled 2022-09-10: qty 4

## 2022-09-10 MED ORDER — NOREPINEPHRINE 4 MG/250ML-% IV SOLN
2.0000 ug/min | INTRAVENOUS | Status: DC
  Administered 2022-09-10 – 2022-09-11 (×2): 5 ug/min via INTRAVENOUS
  Filled 2022-09-10 (×2): qty 250

## 2022-09-10 MED ORDER — PROPOFOL 1000 MG/100ML IV EMUL
INTRAVENOUS | Status: AC
  Administered 2022-09-10: 5 ug/kg/min via INTRAVENOUS
  Filled 2022-09-10: qty 100

## 2022-09-10 MED ORDER — ETOMIDATE 2 MG/ML IV SOLN
INTRAVENOUS | Status: AC
  Filled 2022-09-10: qty 20

## 2022-09-10 MED ORDER — PANTOPRAZOLE SODIUM 40 MG IV SOLR
40.0000 mg | Freq: Once | INTRAVENOUS | Status: AC
  Administered 2022-09-10: 40 mg via INTRAVENOUS
  Filled 2022-09-10: qty 10

## 2022-09-10 MED ORDER — POLYETHYLENE GLYCOL 3350 17 G PO PACK: 17.0000 g | PACK | Freq: Every day | ORAL | Status: DC | PRN

## 2022-09-10 MED ORDER — ENOXAPARIN SODIUM 40 MG/0.4ML IJ SOSY
40.0000 mg | PREFILLED_SYRINGE | INTRAMUSCULAR | Status: DC
  Administered 2022-09-10 – 2022-09-14 (×5): 40 mg via SUBCUTANEOUS
  Filled 2022-09-10 (×5): qty 0.4

## 2022-09-10 MED ORDER — ORAL CARE MOUTH RINSE
15.0000 mL | OROMUCOSAL | Status: DC | PRN
  Administered 2022-09-10: 15 mL via OROMUCOSAL

## 2022-09-10 MED ORDER — ALBUTEROL SULFATE (2.5 MG/3ML) 0.083% IN NEBU
5.0000 mg | INHALATION_SOLUTION | Freq: Once | RESPIRATORY_TRACT | Status: AC
  Administered 2022-09-10: 5 mg via RESPIRATORY_TRACT
  Filled 2022-09-10: qty 6

## 2022-09-10 MED ORDER — KETOROLAC TROMETHAMINE 15 MG/ML IJ SOLN
15.0000 mg | Freq: Once | INTRAMUSCULAR | Status: AC
  Administered 2022-09-10: 15 mg via INTRAVENOUS
  Filled 2022-09-10: qty 1

## 2022-09-10 MED ORDER — PROPOFOL 1000 MG/100ML IV EMUL
5.0000 ug/kg/min | INTRAVENOUS | Status: DC
  Administered 2022-09-10: 35 ug/kg/min via INTRAVENOUS
  Administered 2022-09-11 (×2): 15 ug/kg/min via INTRAVENOUS
  Filled 2022-09-10 (×4): qty 100

## 2022-09-10 MED ORDER — IPRATROPIUM BROMIDE 0.02 % IN SOLN
0.5000 mg | Freq: Once | RESPIRATORY_TRACT | Status: AC
  Administered 2022-09-10: 0.5 mg via RESPIRATORY_TRACT
  Filled 2022-09-10: qty 2.5

## 2022-09-10 MED ORDER — ORAL CARE MOUTH RINSE
15.0000 mL | OROMUCOSAL | Status: DC
  Administered 2022-09-10 – 2022-09-11 (×8): 15 mL via OROMUCOSAL

## 2022-09-10 MED ORDER — FUROSEMIDE 10 MG/ML IJ SOLN
40.0000 mg | Freq: Once | INTRAMUSCULAR | Status: AC
  Administered 2022-09-10: 40 mg via INTRAVENOUS
  Filled 2022-09-10: qty 4

## 2022-09-10 MED ORDER — ATORVASTATIN CALCIUM 20 MG PO TABS
40.0000 mg | ORAL_TABLET | Freq: Every day | ORAL | Status: DC
  Administered 2022-09-12 – 2022-09-14 (×3): 40 mg
  Filled 2022-09-10 (×3): qty 2

## 2022-09-10 MED ORDER — FENTANYL 2500MCG IN NS 250ML (10MCG/ML) PREMIX INFUSION
0.0000 ug/h | INTRAVENOUS | Status: DC
  Administered 2022-09-10: 50 ug/h via INTRAVENOUS
  Administered 2022-09-10: 25 ug/h via INTRAVENOUS
  Filled 2022-09-10: qty 250

## 2022-09-10 MED ORDER — SUCCINYLCHOLINE CHLORIDE 200 MG/10ML IV SOSY
PREFILLED_SYRINGE | INTRAVENOUS | Status: AC
  Filled 2022-09-10: qty 10

## 2022-09-10 NOTE — ED Notes (Signed)
LOC decreased.  Using accessory muscles to breath. Dr. Cherylann Banas called and to bedside.

## 2022-09-10 NOTE — ED Notes (Signed)
Patient agitated.  C/O lower abdominal and lower back pain.  Tachypnea, SOE, HTN. Toradol and lasix given, see MAR.  Troponin drawn.  Reassurance given.  Patient continued to be tachypenic, tachycardic, HTN.  Dr. Cherylann Banas alerted.  Lopressor and ativian ordered.  Given per [facility].  Satis dropped to 80-84% on 4l/ [place].  FM 100% NRB palced on patient.  sat\s improved to 95%

## 2022-09-10 NOTE — Plan of Care (Signed)
Patient remains in ICU; intubated, sedated, requiring vasopressors. Tolerating pressors and sedation as tolerated. PIVs only - no CVC. Foley in place.    Problem: Education: Goal: Knowledge of General Education information will improve Description: Including pain rating scale, medication(s)/side effects and non-pharmacologic comfort measures Outcome: Not Progressing   Problem: Health Behavior/Discharge Planning: Goal: Ability to manage health-related needs will improve Outcome: Not Progressing   Problem: Clinical Measurements: Goal: Ability to maintain clinical measurements within normal limits will improve Outcome: Not Progressing Goal: Will remain free from infection Outcome: Not Progressing Goal: Diagnostic test results will improve Outcome: Not Progressing Goal: Respiratory complications will improve Outcome: Not Progressing Goal: Cardiovascular complication will be avoided Outcome: Not Progressing   Problem: Activity: Goal: Risk for activity intolerance will decrease Outcome: Not Progressing   Problem: Nutrition: Goal: Adequate nutrition will be maintained Outcome: Not Progressing   Problem: Coping: Goal: Level of anxiety will decrease Outcome: Not Progressing   Problem: Elimination: Goal: Will not experience complications related to bowel motility Outcome: Not Progressing Goal: Will not experience complications related to urinary retention Outcome: Not Progressing   Problem: Pain Managment: Goal: General experience of comfort will improve Outcome: Not Progressing   Problem: Safety: Goal: Ability to remain free from injury will improve Outcome: Not Progressing   Problem: Skin Integrity: Goal: Risk for impaired skin integrity will decrease Outcome: Not Progressing

## 2022-09-10 NOTE — ED Provider Notes (Signed)
[facility] Provider Note    Event Date/Time   First MD Initiated Contact with Patient 09/10/22 [phone]     (approximate)   History   Chest Pain and Shortness of Breath   HPI  Miguel [name] is a 72 y.o. male with history of schizophrenia, tobacco use who presents to the emergency department with EMS for shortness of breath, cough and wheezing that started today.  Complaining of tightness, burning in his chest as well.  Symptoms started after smoking an unknown substance but he is not able to tell me what it was that he smoked.  States he took it because he was trying to relax.  He does smoke cigarettes but has never been diagnosed with COPD, asthma or CHF.  EMS reports patient was given 2 DuoNebs in route and 125 mg of IV Solu-Medrol.  He does not wear oxygen chronically.  Sats were 88% with EMS on arrival.  He reports some improvement with breathing treatments.   History provided by patient, EMS.    Past Medical History:  Diagnosis Date   Schizophrenia [facility])     Past Surgical History:  Procedure Laterality Date   APPENDECTOMY      MEDICATIONS:  Prior to Admission medications   Medication Sig Start Date End Date Taking? Authorizing Provider  ABILIFY MAINTENA 400 MG PRSY prefilled syringe Inject 400 mg into the muscle every 30 (thirty) days. 06/27/18   [provider]  atorvastatin (LIPITOR) 40 MG tablet Take 1 tablet (40 mg total) by mouth daily at 6 PM. 07/15/18   Shaune Pollack, MD  benzonatate [facility] PERLES) 100 MG capsule Take 1-2 tabs TID prn cough 10/09/21   Menshew, Charlesetta Ivory, PA-C  enalapril (VASOTEC) 2.5 MG tablet Take 1 tablet (2.5 mg total) by mouth daily. 07/15/18   Shaune Pollack, MD  furosemide (LASIX) 40 MG tablet Take 1 tablet (40 mg total) by mouth daily. 06/19/22 06/19/23  Jene Every, MD  warfarin (COUMADIN) 5 MG tablet Take 1 tablet (5 mg total) by mouth daily. 07/15/18 08/14/18  Shaune Pollack, MD    Physical Exam    Triage Vital Signs: ED Triage Vitals  Enc Vitals Group     BP 09/10/22 0622 (!) 192/140     Pulse Rate 09/10/22 0622 (!) 118     Resp 09/10/22 0622 (!) 22     Temp 09/10/22 0625 98 F (36.7 C)     Temp Source 09/10/22 0625 Oral     SpO2 09/10/22 0622 90 %     Weight 09/10/22 0624 185 lb (83.9 kg)     Height 09/10/22 0624 6\' 1"  (1.854 m)     Head Circumference --      Peak Flow --      Pain Score 09/10/22 0623 8     Pain Loc --      Pain Edu? --      Excl. in GC? --     Most recent vital signs: Vitals:   09/10/22 0622 09/10/22 0625  BP: (!) 192/140   Pulse: (!) 118   Resp: (!) 22   Temp:  98 F (36.7 C)  SpO2: 90%     CONSTITUTIONAL: Alert and oriented and responds appropriately to questions.  Chronically ill-appearing HEAD: Normocephalic, atraumatic EYES: Conjunctivae clear, pupils appear equal, sclera nonicteric ENT: normal nose; moist mucous membranes NECK: Supple, normal ROM CARD: Regular and tachycardic; S1 and S2 appreciated; no murmurs, no clicks, no rubs, no gallops RESP: He  is tachypneic, diminished aeration with scattered expiratory wheezing.  No rhonchi, rales.  He is hypoxic requiring 3 L nasal cannula here. ABD/GI: Normal bowel sounds; non-distended; soft, non-tender, no rebound, no guarding, no peritoneal signs BACK: The back appears normal EXT: Normal ROM in all joints; no deformity noted, patient does have some mild pitting edema in bilateral lower extremities with no asymmetry or calf tenderness SKIN: Normal color for age and race; warm; no rash on exposed skin NEURO: Moves all extremities equally, normal speech PSYCH: The patient's mood and manner are appropriate.   ED Results / Procedures / Treatments   LABS: (all labs ordered are listed, but only abnormal results are displayed) Labs Reviewed  RESP PANEL BY RT-PCR (RSV, FLU A&B, COVID)  RVPGX2  CBC WITH DIFFERENTIAL/PLATELET  BASIC METABOLIC PANEL  BRAIN NATRIURETIC PEPTIDE  TROPONIN I  (HIGH SENSITIVITY)     EKG:  EKG Interpretation  Date/Time:  Monday September 10 2022 06:20:35 EST Ventricular Rate:  118 PR Interval:  150 QRS Duration: 89 QT Interval:  343 QTC Calculation: 481 R Axis:   95 Text Interpretation: Sinus tachycardia Biatrial enlargement Right axis deviation Consider left ventricular hypertrophy Anterior Q waves, possibly due to LVH Confirmed by Pryor Curia [phone]) on 09/10/2022 6:58:23 AM         RADIOLOGY: My personal review and interpretation of imaging: Chest x-ray pending.  I have personally reviewed all radiology reports.   No results found.   PROCEDURES:  Critical Care performed: Yes, see critical care procedure note(s)   CRITICAL CARE Performed by: Pryor Curia   Total critical care time: 45 minutes  Critical care time was exclusive of separately billable procedures and treating other patients.  Critical care was necessary to treat or prevent imminent or life-threatening deterioration.  Critical care was time spent personally by me on the following activities: development of treatment plan with patient and/or surrogate as well as nursing, discussions with consultants, evaluation of patient's response to treatment, examination of patient, obtaining history from patient or surrogate, ordering and performing treatments and interventions, ordering and review of laboratory studies, ordering and review of radiographic studies, pulse oximetry and re-evaluation of patient's condition.   Marland Kitchen1-3 Lead EKG Interpretation  Performed by: Amare Kontos, Delice Bison, DO Authorized by: Maurine Mowbray, Delice Bison, DO     Interpretation: abnormal     ECG rate:  118   ECG rate assessment: tachycardic     Rhythm: sinus tachycardia     Ectopy: none     Conduction: normal       IMPRESSION / MDM / ASSESSMENT AND PLAN / ED COURSE  I reviewed the triage vital signs and the nursing notes.    Patient here with shortness of breath, wheezing and hypoxia.  Also  complaining of chest tightness, burning.  The patient is on the cardiac monitor to evaluate for evidence of arrhythmia and/or significant heart rate changes.   DIFFERENTIAL DIAGNOSIS (includes but not limited to):   ACS, PE, dissection, pneumonia, pneumothorax, CHF exacerbation, COPD exacerbation, viral URI   Patient's presentation is most consistent with acute presentation with potential threat to life or bodily function.   PLAN: Will obtain CBC, BMP, BNP to evaluate for CHF exacerbation, troponin x 2, chest x-ray, COVID and flu swabs.  Will continue breathing treatments.  Will give medications for burning chest pain which could be esophagitis, GERD.  EKG showed no ischemic abnormality.  Patient will need admission given new onset hypoxia.   MEDICATIONS GIVEN IN ED: Medications  albuterol (PROVENTIL) (2.5 MG/3ML) 0.083% nebulizer solution 5 mg (has no administration in time range)  ipratropium (ATROVENT) nebulizer solution 0.5 mg (has no administration in time range)  pantoprazole (PROTONIX) injection 40 mg (has no administration in time range)  alum & mag hydroxide-simeth (MAALOX/MYLANTA) 200-200-20 MG/5ML suspension 30 mL (has no administration in time range)  lidocaine (XYLOCAINE) 2 % viscous mouth solution 15 mL (has no administration in time range)     ED COURSE: Workup pending.  Signed out to oncoming ED physician.   CONSULTS: Patient will need admission to hospitalist service for new onset hypoxia, bronchospasm.   OUTSIDE RECORDS REVIEWED: Reviewed patient's last cardiology note with Dr. Clayborn Bigness in 2016.       FINAL CLINICAL IMPRESSION(S) / ED DIAGNOSES   Final diagnoses:  Bronchospasm  Chest pain, unspecified type  Acute respiratory failure with hypoxia ([place])     Rx / DC Orders   ED Discharge Orders     None        Note:  This document was prepared using Dragon voice recognition software and may include unintentional dictation errors.   Jeremey Bascom,  Delice Bison, DO 09/10/22 [phone]

## 2022-09-10 NOTE — Progress Notes (Signed)
PHARMACY CONSULT NOTE - FOLLOW UP  Pharmacy Consult for Electrolyte Monitoring and Replacement   Recent Labs: Potassium (mmol/L)  Date Value  09/10/2022 3.9  09/15/2012 4.4   Calcium (mg/dL)  Date Value  09/10/2022 8.4 (L)   Calcium, Total (mg/dL)  Date Value  09/15/2012 9.4   Albumin (g/dL)  Date Value  02/19/2022 3.4 (L)  09/15/2012 4.2   Sodium (mmol/L)  Date Value  09/10/2022 141  09/15/2012 146 (H)     Assessment: 72 yo M presents to the ED with Chest pain/Respiratory distress. Later became agitated and intubated d/t acute respiratory distress.   Goal of Therapy:  Electrolytes WNL  Plan:  K = 3.9, wnl Ca = 8.4 ordered add-on albumin as it has been low in the past Ordered add-on Mag and Phos.  Alison Murray ,PharmD Clinical Pharmacist 09/10/2022 11:24 AM

## 2022-09-10 NOTE — Progress Notes (Addendum)
1400 Received patient from ED via bed. Patient unresponsive but on sedation. CHG bath done . Check in process done. No skin issues noted. No spontaneous movements note. 1600 Down to CT for CT of the chest.  1700 OG placed per orders.

## 2022-09-10 NOTE — ED Notes (Signed)
Bair hugger applied.

## 2022-09-10 NOTE — ED Triage Notes (Signed)
Pt comes by ems from home for Cp & SOB. Pt states he smoked an unknown drug and the pain was sudden. Pt states chest feels tight and is restless. A&Ox4. During triage and is restless during triage.   EMS gave pt solumedrol 125 mg and 2 duo-neb txt. RR 31. Wheezing bilaterally in lungs. A&Ox4.

## 2022-09-10 NOTE — Progress Notes (Signed)
PHARMACY CONSULT NOTE - FOLLOW UP  Pharmacy Consult for Electrolyte Monitoring and Replacement   Recent Labs: Potassium (mmol/L)  Date Value  09/10/2022 3.9  09/15/2012 4.4   Magnesium (mg/dL)  Date Value  09/10/2022 2.0   Calcium (mg/dL)  Date Value  09/10/2022 8.4 (L)   Calcium, Total (mg/dL)  Date Value  09/15/2012 9.4   Albumin (g/dL)  Date Value  09/10/2022 3.8  09/15/2012 4.2   Phosphorus (mg/dL)  Date Value  09/10/2022 4.0   Sodium (mmol/L)  Date Value  09/10/2022 141  09/15/2012 146 (H)     Assessment: 72 yo M presents to the ED with Chest pain/Respiratory distress. Later became agitated and intubated d/t acute respiratory distress.   Goal of Therapy:  Electrolytes WNL  Plan:  K = 3.9, wnl Ca = 8.4 (corrected calcium 8.6) Mg = 2.0 Phos = 4.0 All levels above the threshold for replacement per protocol. Will order CMP, Mag and Phos with 1/16 AM labs.  Alison Murray ,PharmD Clinical Pharmacist 09/10/2022 12:59 PM

## 2022-09-10 NOTE — ED Notes (Signed)
Gave report to Prince RN in ICU.

## 2022-09-10 NOTE — ED Provider Notes (Addendum)
-----------------------------------------   9:56 AM on 09/10/2022 -----------------------------------------   I took over care of this patient from Dr. Leonides Schanz.  Initial concern was for possible new onset COPD given the patient's wheezing although workup has been suggestive of possible CHF as well with interstitial opacities on the chest x-ray and an elevated BNP.  Over the last 2 hours, the patient started to appear more anxious and agitated, repeatedly standing up, sitting down, kneeling on the floor, and having difficulty standing and bed.  I reassessed around 9 AM and he was alert and oriented but reported feeling jittery and also had thoracic and low pain likely due to the coughing.  He was also significantly hypertensive as high as 200/120 and remained tachycardic to the 110s.  I ordered IV labetalol and a dose of Toradol for pain.  However shortly after the patient became even more agitated and anxious appearing.  It appeared that some of this could have been due to side effects of the albuterol.  I ordered a low dose of Ativan.  Shortly after, the patient became somnolent with decreased respiratory effort.  He became hypoxic and required O2 by nonrebreather, and was responding only to painful stimuli.  At that time I proceeded with intubation due to acute respiratory failure.  He was intubated successfully on the first attempt without complication.  I have started him on propofol and fentanyl infusions.  We will obtain a CT angio of the chest to rule out PE.  I consulted Dr. Lanney Gins from the ICU; based on our discussion he agrees to evaluate the patient for admission.  Procedure Name: Intubation Date/Time: 09/10/2022 10:12 AM  Performed by: Arta Silence, MDPre-anesthesia Checklist: Patient identified, Patient being monitored, Emergency Drugs available, Timeout performed and Suction available Oxygen Delivery Method: Non-rebreather mask Preoxygenation: Pre-oxygenation with 100%  oxygen Induction Type: Rapid sequence Laryngoscope Size: Glidescope and 4 Grade View: Grade I Tube size: 7.5 mm Number of attempts: 1 Placement Confirmation: ETT inserted through vocal cords under direct vision, CO2 detector and Breath sounds checked- equal and bilateral Secured at: 25 cm Tube secured with: ETT holder    .Critical Care  Performed by: Arta Silence, MD Authorized by: Arta Silence, MD   Critical care provider statement:    Critical care time (minutes):  45   Critical care time was exclusive of:  Separately billable procedures and treating other patients   Critical care was necessary to treat or prevent imminent or life-threatening deterioration of the following conditions:  Respiratory failure   Critical care was time spent personally by me on the following activities:  Development of treatment plan with patient or surrogate, discussions with consultants, evaluation of patient's response to treatment, examination of patient, ordering and review of laboratory studies, ordering and review of radiographic studies, ordering and performing treatments and interventions, pulse oximetry, re-evaluation of patient's condition, review of old charts and obtaining history from patient or surrogate   Care discussed with: admitting provider        Arta Silence, MD 09/10/22 1013

## 2022-09-10 NOTE — ED Notes (Signed)
IV team at bedside 

## 2022-09-10 NOTE — H&P (Addendum)
NAME:  Miguel [name], MRN:  [id number], DOB:  March 10, 1951, LOS: 0 ADMISSION DATE:  09/10/2022, CONSULTATION DATE: 09/10/22 REFERRING MD: Dr. Cherylann Banas , CHIEF COMPLAINT: Shortness of Breath   History of Present Illness:  This is a 72 yo male with hx of Current Smoker (Cigarettes) who presented to [facility] ER on 01/15 via EMS with c/o acute onset of shortness of breath, wheezing, and chest pain after smoking an unknown drug. He also reported chest tightness and restlessness.  Upon EMS arrival pt hypoxic with O2 sats 88%.  Pt received 125 mg of iv solumedrol and duonebs x2 with slight improvement.  HPI obtained from the chart pt post mechanical intubation.  ED Course Upon arrival to the ER pt alert and oriented but complained of restlessness and chest tightness. Pt severely hypertensive sbp 170 to 200. CXR concerning for  mild cardiomegaly and suspected chronic pulmonary interstitial changes.  Lab results revealed: BNP 713.4/troponin 37/COVID-19 negative/Influenza A&B negative/RSV negative.  EKG showed sinus tachycardia, heart rate 118, no ischemic abnormality.  Pt received bronchodilators and toradol.  He later c/o abdominal/lower back pain and became tachypneic and hypertensive.  He received 40 mg of iv lasix and 1 mg of iv ativan for anxiety.   Post administration pt became hypoxic O2 sats 80-84% with decreased level of consciousness requiring NRB.  Pt subsequently mechanically intubated by ED provider.  PCCM team contacted for ICU admission.    Pertinent  Medical History  Schizophrenia  Depression Current Smoker  Chronic Systolic CHF (Echo 93/81/0175: EF 25 to 30%, high risk for left ventricular mural thrombus given large lesions of severe hypokinesis: per Cardiology recs pt started on coumadin no indication for TEE but outpatient Cardiology follow-up recommended)  CVA with aphasia   Significant Hospital Events: Including procedures, antibiotic start and stop dates in addition to other pertinent  events   01/15: Pt admitted to ICU mechanically intubated   Interim History / Subjective:  Pt mechanically intubated sedated with propofol and fentanyl gtts.    Objective   Blood pressure 109/71, pulse 66, temperature (!) 97.2 F (36.2 C), resp. rate (!) 21, height 6\' 1"  (1.854 m), weight 83.9 kg, SpO2 100 %.    Vent Mode: AC FiO2 (%):  [80 %] 80 % Set Rate:  [18 bmp] 18 bmp Vt Set:  [500 mL] 500 mL PEEP:  [5 cmH20] 5 cmH20  No intake or output data in the 24 hours ending 09/10/22 1057 Filed Weights   09/10/22 0624  Weight: 83.9 kg    Examination: General: Acutely-ill appearing male, NAD mechanically intubated  HENT: Supple, no JVD Lungs: Rhonchi throughout, eve, non labored  Cardiovascular: NSR, s1s2, no r/g, 2+ radial/1+ distal pulses, no edema  Abdomen: +BS x4, obese, soft, non distended  Extremities: Normal bulk and tone  Neuro: Sedated, not following commands, withdraws from stimulation, bilateral pupils pinpoint/sluggish  GU: Indwelling foley catheter draining yellow urine   Resolved Hospital Problem list     Assessment & Plan:   Acute hypoxic respiratory failure suspect secondary to flash pulmonary edema in the setting of hypertensive urgency following drug use  Mechanical intubation  Hx: Current smoker  - Full vent support for now: vent settings reviewed and established  - Continue lung protective strategies  - SBT once all parameters met  - VAP bundle implemented  - Intermittent CXR and ABG - Respiratory cultures results pending   - Maintain RASS goal 0 to -1 - PAD protocol to maintain RASS goal: Prn propofol and  fentanyl gtts to maintain RASS goal  - WUA daily  - Smoking cessation counseling provided   Elevated troponin's likely demand ischemia Angina likely secondary to cocaine use  Hypertensive urgency~resolved   Acute on chronic systolic CHF  Hypotension secondary to sedating medication  Chronic Systolic CHF  Hx: CVA  Echo 07/13/2018: EF 25 to  30%, high risk for left ventricular mural thrombus given large lesions of severe hypokinesis: per Cardiology recs pt started on coumadin no indication for TEE but outpatient Cardiology follow-up recommended.  It appears pt did not follow-up and has been noncompliant with coumadin  - Continuous telemetry monitoring  - Trend troponin's until peaked  - Prn levophed gtt to maintain map >65  - Echo pending  - Hold outpatient enalapril and lasix for now due to marginal bps  - Resume outpatient atorvastatin  - CTA Chest to r/o PE   Endo: - CBG's q4hrs  - Follow hypoglycemic/hyperglycemic protocol   Hypothermia  - Bare hugger to maintain normothermia  - R/o sepsis   Polysubstance abuse Hx: Depression and schizophrenia  - Once extubated will provide polysubstance cessation counseling  - Hold outpatient abilify for now   Best Practice (right click and "Reselect all SmartList Selections" daily)   Diet/type: NPO; Will place dietitian consult for TF's  DVT prophylaxis: LMWH GI prophylaxis: H2B Lines: N/A Foley:  Yes, and it is still needed Code Status:  full code Last date of multidisciplinary goals of care discussion [N/A]  Labs   CBC: Recent Labs  Lab 09/10/22 0622  WBC 8.4  NEUTROABS 4.9  HGB 14.3  HCT 44.4  MCV 96.5  PLT 505*    Basic Metabolic Panel: Recent Labs  Lab 09/10/22 0622  NA 141  K 3.9  CL 103  CO2 27  GLUCOSE 123*  BUN 14  CREATININE 0.94  CALCIUM 8.4*   GFR: Estimated Creatinine Clearance: 83.8 mL/min (by C-G formula based on SCr of 0.94 mg/dL). Recent Labs  Lab 09/10/22 0622  WBC 8.4    Liver Function Tests: No results for input(s): "AST", "ALT", "ALKPHOS", "BILITOT", "PROT", "ALBUMIN" in the last 168 hours. No results for input(s): "LIPASE", "AMYLASE" in the last 168 hours. No results for input(s): "AMMONIA" in the last 168 hours.  ABG No results found for: "PHART", "PCO2ART", "PO2ART", "HCO3", "TCO2", "ACIDBASEDEF", "O2SAT"    Coagulation Profile: No results for input(s): "INR", "PROTIME" in the last 168 hours.  Cardiac Enzymes: No results for input(s): "CKTOTAL", "CKMB", "CKMBINDEX", "TROPONINI" in the last 168 hours.  HbA1C: Hgb A1c MFr Bld  Date/Time Value Ref Range Status  07/14/2018 04:29 AM 5.8 (H) 4.8 - 5.6 % Final    Comment:    (NOTE) Pre diabetes:          5.7%-6.4% Diabetes:              >6.4% Glycemic control for   <7.0% adults with diabetes   07/13/2018 04:25 AM 6.2 (H) 4.8 - 5.6 % Final    Comment:    (NOTE) Pre diabetes:          5.7%-6.4% Diabetes:              >6.4% Glycemic control for   <7.0% adults with diabetes     CBG: No results for input(s): "GLUCAP" in the last 168 hours.  Review of Systems:   Unable to assess pt mechanically intubated   Past Medical History:  He,  has a past medical history of Schizophrenia ([place]).   Surgical  History:   Past Surgical History:  Procedure Laterality Date   APPENDECTOMY       Social History:   reports that he has never smoked. He has never used smokeless tobacco. He reports that he does not drink alcohol and does not use drugs.   Family History:  His family history is not on file.   Allergies No Known Allergies   Home Medications  Prior to Admission medications   Medication Sig Start Date End Date Taking? Authorizing Provider  ARISTADA 882 MG/3.2ML prefilled syringe Inject 882 mg into the muscle every 30 (thirty) days. 08/23/22  Yes [provider]  ABILIFY MAINTENA 400 MG PRSY prefilled syringe Inject 400 mg into the muscle every 30 (thirty) days. 06/27/18   [provider]  atorvastatin (LIPITOR) 40 MG tablet Take 1 tablet (40 mg total) by mouth daily at 6 PM. 07/15/18   Shaune Pollack, MD  benzonatate [facility] PERLES) 100 MG capsule Take 1-2 tabs TID prn cough 10/09/21   Menshew, Charlesetta Ivory, PA-C  enalapril (VASOTEC) 2.5 MG tablet Take 1 tablet (2.5 mg total) by mouth daily. 07/15/18   Shaune Pollack, MD   furosemide (LASIX) 40 MG tablet Take 1 tablet (40 mg total) by mouth daily. 06/19/22 06/19/23  Jene Every, MD  warfarin (COUMADIN) 5 MG tablet Take 1 tablet (5 mg total) by mouth daily. 07/15/18 08/14/18  Shaune Pollack, MD     Critical care time: 60 minutes      Zada Girt, [facility]  Pulmonary/Critical Care Pager [phone] (please enter 7 digits) PCCM Consult Pager [phone] (please enter 7 digits)

## 2022-09-11 ENCOUNTER — Inpatient Hospital Stay: Payer: Medicare Other

## 2022-09-11 ENCOUNTER — Inpatient Hospital Stay (HOSPITAL_COMMUNITY)
Admit: 2022-09-11 | Discharge: 2022-09-11 | Disposition: A | Discharge status: Home / Self Care | Payer: Medicare Other | Attending: Critical Care Medicine | Admitting: Critical Care Medicine

## 2022-09-11 DIAGNOSIS — R0603 Acute respiratory distress: Secondary | ICD-10-CM | POA: Diagnosis not present

## 2022-09-11 DIAGNOSIS — T405X1A Poisoning by cocaine, accidental (unintentional), initial encounter: Secondary | ICD-10-CM | POA: Diagnosis not present

## 2022-09-11 DIAGNOSIS — J9600 Acute respiratory failure, unspecified whether with hypoxia or hypercapnia: Secondary | ICD-10-CM | POA: Diagnosis not present

## 2022-09-11 LAB — ECHOCARDIOGRAM COMPLETE
AR max vel: 1.65 cm2
AV Area VTI: 1.49 cm2
AV Area mean vel: 1.55 cm2
AV Mean grad: 2.3 mmHg
AV Peak grad: 4.5 mmHg
Ao pk vel: 1.06 m/s
Area-P 1/2: 3.66 cm2
Calc EF: 16.4 %
Est EF: <20
Height: 73 in
S' Lateral: 4.8 cm
Single Plane A2C EF: 15.8 %
Single Plane A4C EF: 22.5 %
Weight: 2610.25 oz

## 2022-09-11 LAB — GLUCOSE, CAPILLARY
Glucose-Capillary: 112 mg/dL — ABNORMAL HIGH (ref 70–99)
Glucose-Capillary: 101 mg/dL — ABNORMAL HIGH (ref 70–99)
Glucose-Capillary: 107 mg/dL — ABNORMAL HIGH (ref 70–99)
Glucose-Capillary: 84 mg/dL (ref 70–99)
Glucose-Capillary: 89 mg/dL (ref 70–99)
Glucose-Capillary: 117 mg/dL — ABNORMAL HIGH (ref 70–99)

## 2022-09-11 LAB — COMPREHENSIVE METABOLIC PANEL
ALT: 19 U/L (ref 0–44)
AST: 21 U/L (ref 15–41)
Albumin: 3 g/dL — ABNORMAL LOW (ref 3.5–5.0)
Alkaline Phosphatase: 44 U/L (ref 38–126)
Anion gap: 9 (ref 5–15)
BUN: 21 mg/dL (ref 8–23)
CO2: 26 mmol/L (ref 22–32)
Calcium: 8 mg/dL — ABNORMAL LOW (ref 8.9–10.3)
Chloride: 103 mmol/L (ref 98–111)
Creatinine, Ser: 1.07 mg/dL (ref 0.61–1.24)
GFR, Estimated: >60 mL/min (ref >60)
Glucose, Bld: 120 mg/dL — ABNORMAL HIGH (ref 70–99)
Potassium: 4.1 mmol/L (ref 3.5–5.1)
Sodium: 138 mmol/L (ref 135–145)
Total Bilirubin: 1 mg/dL (ref 0.3–1.2)
Total Protein: 5.9 g/dL — ABNORMAL LOW (ref 6.5–8.1)

## 2022-09-11 LAB — PHOSPHORUS: Phosphorus: 3.8 mg/dL (ref 2.5–4.6)

## 2022-09-11 LAB — MAGNESIUM: Magnesium: 1.8 mg/dL (ref 1.7–2.4)

## 2022-09-11 MED ORDER — ZIPRASIDONE MESYLATE 20 MG IM SOLR: 20.0000 mg | Freq: Once | INTRAMUSCULAR | Status: DC

## 2022-09-11 MED ORDER — ZIPRASIDONE MESYLATE 20 MG IM SOLR: 10.0000 mg | Freq: Once | INTRAMUSCULAR | Status: DC

## 2022-09-11 MED ORDER — DEXMEDETOMIDINE HCL IN NACL 400 MCG/100ML IV SOLN
0.0000 ug/kg/h | INTRAVENOUS | Status: DC
  Administered 2022-09-11: 0.6 ug/kg/h via INTRAVENOUS
  Administered 2022-09-11 – 2022-09-12 (×2): 0.4 ug/kg/h via INTRAVENOUS
  Filled 2022-09-11 (×3): qty 100

## 2022-09-11 MED ORDER — ORAL CARE MOUTH RINSE
15.0000 mL | OROMUCOSAL | Status: DC
  Administered 2022-09-11 – 2022-09-15 (×12): 15 mL via OROMUCOSAL

## 2022-09-11 MED ORDER — LORAZEPAM 2 MG/ML IJ SOLN
INTRAMUSCULAR | Status: AC
  Filled 2022-09-11: qty 1

## 2022-09-11 MED ORDER — HALOPERIDOL LACTATE 5 MG/ML IJ SOLN
INTRAMUSCULAR | Status: AC
  Administered 2022-09-11: 2.5 mg via INTRAVENOUS
  Filled 2022-09-11: qty 1

## 2022-09-11 MED ORDER — HALOPERIDOL LACTATE 5 MG/ML IJ SOLN: 2.5000 mg | Freq: Once | INTRAMUSCULAR | Status: AC

## 2022-09-11 MED ORDER — PIPERACILLIN-TAZOBACTAM 3.375 G IVPB
3.3750 g | Freq: Three times a day (TID) | INTRAVENOUS | Status: DC
  Administered 2022-09-11 – 2022-09-12 (×5): 3.375 g via INTRAVENOUS
  Filled 2022-09-11 (×5): qty 50

## 2022-09-11 MED ORDER — LORAZEPAM 2 MG/ML PO CONC: 1.0000 mg | ORAL | Status: DC

## 2022-09-11 MED ORDER — MAGNESIUM SULFATE 2 GM/50ML IV SOLN
2.0000 g | Freq: Once | INTRAVENOUS | Status: AC
  Administered 2022-09-11: 2 g via INTRAVENOUS
  Filled 2022-09-11: qty 50

## 2022-09-11 MED ORDER — FENTANYL BOLUS VIA INFUSION: 25.0000 ug | INTRAVENOUS | Status: DC | PRN

## 2022-09-11 MED ORDER — ORAL CARE MOUTH RINSE: 15.0000 mL | OROMUCOSAL | Status: DC | PRN

## 2022-09-11 NOTE — Progress Notes (Signed)
RT arrived to room due to vent alarm, found pt with vent tubbing in hand and ETT with holder around neck. Security called for pt aggressiveness. NP spoke with pt and pt relaxed. Pt placed on 4Lnc with no resp distress noted at this time. Will continue to monitor closely.

## 2022-09-11 NOTE — Progress Notes (Signed)
NAME:  Miguel [name], MRN:  [id number], DOB:  05-Oct-1950, LOS: 1 ADMISSION DATE:  09/10/2022, CONSULTATION DATE: 09/10/22 REFERRING MD: Dr. Cherylann Banas , CHIEF COMPLAINT: Shortness of Breath   History of Present Illness:  This is a 72 yo male with hx of Current Smoker (Cigarettes) who presented to [facility] ER on 01/15 via EMS with c/o acute onset of shortness of breath, wheezing, and chest pain after smoking an unknown drug. He also reported chest tightness and restlessness.  Upon EMS arrival pt hypoxic with O2 sats 88%.  Pt received 125 mg of iv solumedrol and duonebs x2 with slight improvement.  HPI obtained from the chart pt post mechanical intubation.  ED Course Upon arrival to the ER pt alert and oriented but complained of restlessness and chest tightness. Pt severely hypertensive sbp 170 to 200. CXR concerning for  mild cardiomegaly and suspected chronic pulmonary interstitial changes.  Lab results revealed: BNP 713.4/troponin 37/COVID-19 negative/Influenza A&B negative/RSV negative.  EKG showed sinus tachycardia, heart rate 118, no ischemic abnormality.  Pt received bronchodilators and toradol.  He later c/o abdominal/lower back pain and became tachypneic and hypertensive.  He received 40 mg of iv lasix and 1 mg of iv ativan for anxiety.   Post administration pt became hypoxic O2 sats 80-84% with decreased level of consciousness requiring NRB.  Pt subsequently mechanically intubated by ED provider.  PCCM team contacted for ICU admission.    Pertinent  Medical History  Schizophrenia  Depression Current Smoker  Chronic Systolic CHF (Echo 65/68/1275: EF 25 to 30%, high risk for left ventricular mural thrombus given large lesions of severe hypokinesis: per Cardiology recs pt started on coumadin no indication for TEE but outpatient Cardiology follow-up recommended)  CVA with aphasia   Significant Hospital Events: Including procedures, antibiotic start and stop dates in addition to other pertinent  events   01/15: Pt admitted to ICU mechanically intubated  09/11/22 - patient self extubated, has aggitated paranoid schizophrnia, family came by to check on him  Interim History / Subjective:  Pt mechanically intubated sedated with propofol and fentanyl gtts.    Objective   Blood pressure 101/63, pulse 60, temperature 99.3 F (37.4 C), resp. rate 11, height 6\' 1"  (1.854 m), weight 74 kg, SpO2 95 %.    Vent Mode: PRVC FiO2 (%):  [28 %-40 %] 28 % Set Rate:  [18 bmp] 18 bmp Vt Set:  [500 mL] 500 mL PEEP:  [5 cmH20] 5 cmH20 Plateau Pressure:  [16 cmH20-17 cmH20] 17 cmH20   Intake/Output Summary (Last 24 hours) at 09/11/2022 1352 Last data filed at 09/11/2022 1201 Gross per 24 hour  Intake 1212.06 ml  Output 1005 ml  Net 207.06 ml   Filed Weights   09/10/22 0624 09/10/22 1415 09/11/22 0400  Weight: 83.9 kg 67 kg 74 kg    Examination: General: Acutely-ill appearing male, NAD mechanically intubated  HENT: Supple, no JVD Lungs: Rhonchi throughout, eve, non labored  Cardiovascular: NSR, s1s2, no r/g, 2+ radial/1+ distal pulses, no edema  Abdomen: +BS x4, obese, soft, non distended  Extremities: Normal bulk and tone  Neuro: Sedated, not following commands, withdraws from stimulation, bilateral pupils pinpoint/sluggish  GU: Indwelling foley catheter draining yellow urine   Resolved Hospital Problem list     Assessment & Plan:   Acute hypoxic respiratory failure suspect secondary to flash pulmonary edema in the setting of hypertensive urgency following drug use  Mechanical intubation  Hx: Current smoker  - Full vent support for now: vent  settings reviewed and established  - Continue lung protective strategies  - SBT once all parameters met  - VAP bundle implemented  - Intermittent CXR and ABG - Respiratory cultures results pending   - Maintain RASS goal 0 to -1 - PAD protocol to maintain RASS goal: Prn propofol and fentanyl gtts to maintain RASS goal  - WUA daily  - Smoking  cessation counseling provided   Elevated troponin's likely demand ischemia Angina likely secondary to cocaine use  Hypertensive urgency~resolved   Acute on chronic systolic CHF  Hypotension secondary to sedating medication  Chronic Systolic CHF  Hx: CVA  Echo 07/13/2018: EF 25 to 30%, high risk for left ventricular mural thrombus given large lesions of severe hypokinesis: per Cardiology recs pt started on coumadin no indication for TEE but outpatient Cardiology follow-up recommended.  It appears pt did not follow-up and has been noncompliant with coumadin  - Continuous telemetry monitoring  - Trend troponin's until peaked  - Prn levophed gtt to maintain map >65  - Echo pending  - Hold outpatient enalapril and lasix for now due to marginal bps  - Resume outpatient atorvastatin  - CTA Chest to r/o PE   Endo: - CBG's q4hrs  - Follow hypoglycemic/hyperglycemic protocol   Hypothermia  - Bare hugger to maintain normothermia  - R/o sepsis   Polysubstance abuse Hx: Depression and schizophrenia  - Once extubated will provide polysubstance cessation counseling  - Hold outpatient abilify for now   Best Practice (right click and "Reselect all SmartList Selections" daily)   Diet/type: NPO; Will place dietitian consult for TF's  DVT prophylaxis: LMWH GI prophylaxis: H2B Lines: N/A Foley:  Yes, and it is still needed Code Status:  full code Last date of multidisciplinary goals of care discussion [N/A]  Labs   CBC: Recent Labs  Lab 09/10/22 0622  WBC 8.4  NEUTROABS 4.9  HGB 14.3  HCT 44.4  MCV 96.5  PLT 505*     Basic Metabolic Panel: Recent Labs  Lab 09/10/22 0622 09/10/22 0913 09/11/22 0342  NA 141  --  138  K 3.9  --  4.1  CL 103  --  103  CO2 27  --  26  GLUCOSE 123*  --  120*  BUN 14  --  21  CREATININE 0.94  --  1.07  CALCIUM 8.4*  --  8.0*  MG  --  2.0 1.8  PHOS 4.0  --  3.8    GFR: Estimated Creatinine Clearance: 68.2 mL/min (by C-G formula based on  SCr of 1.07 mg/dL). Recent Labs  Lab 09/10/22 0622 09/10/22 1310 09/10/22 1445 09/10/22 1708  PROCALCITON  --  <0.10  --   --   WBC 8.4  --   --   --   LATICACIDVEN  --   --  3.0* 1.9     Liver Function Tests: Recent Labs  Lab 09/10/22 0913 09/11/22 0342  AST  --  21  ALT  --  19  ALKPHOS  --  44  BILITOT  --  1.0  PROT  --  5.9*  ALBUMIN 3.8 3.0*   No results for input(s): "LIPASE", "AMYLASE" in the last 168 hours. No results for input(s): "AMMONIA" in the last 168 hours.  ABG    Component Value Date/Time   PHART 7.45 09/10/2022 1134   PCO2ART 46 09/10/2022 1134   PO2ART 121 (H) 09/10/2022 1134   HCO3 32.0 (H) 09/10/2022 1134   O2SAT 99.3 09/10/2022 1134  Coagulation Profile: No results for input(s): "INR", "PROTIME" in the last 168 hours.  Cardiac Enzymes: Recent Labs  Lab 09/10/22 1445  CKTOTAL 158    HbA1C: Hgb A1c MFr Bld  Date/Time Value Ref Range Status  07/14/2018 04:29 AM 5.8 (H) 4.8 - 5.6 % Final    Comment:    (NOTE) Pre diabetes:          5.7%-6.4% Diabetes:              >6.4% Glycemic control for   <7.0% adults with diabetes   07/13/2018 04:25 AM 6.2 (H) 4.8 - 5.6 % Final    Comment:    (NOTE) Pre diabetes:          5.7%-6.4% Diabetes:              >6.4% Glycemic control for   <7.0% adults with diabetes     CBG: Recent Labs  Lab 09/10/22 1417 09/10/22 2003 09/10/22 2320 09/11/22 0358 09/11/22 0811  GLUCAP 106* 140* 102* 112* 101*    Review of Systems:   Unable to assess pt mechanically intubated   Past Medical History:  He,  has a past medical history of Schizophrenia ([facility]).   Surgical History:   Past Surgical History:  Procedure Laterality Date   APPENDECTOMY       Social History:   reports that he has never smoked. He has never used smokeless tobacco. He reports that he does not drink alcohol and does not use drugs.   Family History:  His family history is not on file.   Allergies No Known Allergies    Home Medications  Prior to Admission medications   Medication Sig Start Date End Date Taking? Authorizing Provider  ARISTADA 882 MG/3.2ML prefilled syringe Inject 882 mg into the muscle every 30 (thirty) days. 08/23/22  Yes [provider]  ABILIFY MAINTENA 400 MG PRSY prefilled syringe Inject 400 mg into the muscle every 30 (thirty) days. 06/27/18   [provider]  atorvastatin (LIPITOR) 40 MG tablet Take 1 tablet (40 mg total) by mouth daily at 6 PM. 07/15/18   Shaune Pollack, MD  benzonatate [facility] PERLES) 100 MG capsule Take 1-2 tabs TID prn cough 10/09/21   Menshew, Charlesetta Ivory, PA-C  enalapril (VASOTEC) 2.5 MG tablet Take 1 tablet (2.5 mg total) by mouth daily. 07/15/18   Shaune Pollack, MD  furosemide (LASIX) 40 MG tablet Take 1 tablet (40 mg total) by mouth daily. 06/19/22 06/19/23  Jene Every, MD  warfarin (COUMADIN) 5 MG tablet Take 1 tablet (5 mg total) by mouth daily. 07/15/18 08/14/18  Shaune Pollack, MD     Critical care provider statement:   Total critical care time: 33 minutes   Performed by: Karna Christmas MD   Critical care time was exclusive of separately billable procedures and treating other patients.   Critical care was necessary to treat or prevent imminent or life-threatening deterioration.   Critical care was time spent personally by me on the following activities: development of treatment plan with patient and/or surrogate as well as nursing, discussions with consultants, evaluation of patient's response to treatment, examination of patient, obtaining history from patient or surrogate, ordering and performing treatments and interventions, ordering and review of laboratory studies, ordering and review of radiographic studies, pulse oximetry and re-evaluation of patient's condition.    Vida Rigger, M.D.  Pulmonary & Critical Care Medicine

## 2022-09-11 NOTE — Progress Notes (Signed)
Progress Note  Updated pts brother Tsuneo Faison and sister Breland Trouten regarding pts condition and current plan of care.  All questions were answered.   Donell Beers, [place] Pager [phone] (please enter 7 digits) PCCM Consult Pager [phone] (please enter 7 digits)

## 2022-09-11 NOTE — Consult Note (Addendum)
Pharmacy Antibiotic Note  Miguel [name] is a 72 y.o. male admitted on 09/10/2022 with pneumonia. PMH significant for TUD (current smoker), sCHF (EF 25-30% 06/2018), CVA, depression, schizophrenia. Pharmacy has been consulted for Zosyn dosing.  Plan: Day 1 of antibiotics Start Zosyn 3.375 g IV Q8H Continue to monitor renal function and follow culture results   Height: 6\' 1"  (185.4 cm) Weight: 74 kg (163 lb 2.3 oz) IBW/kg (Calculated) : 79.9  Temp (24hrs), Avg:98 F (36.7 C), Min:79.9 F (26.6 C), Max:100.2 F (37.9 C)  Recent Labs  Lab 09/10/22 0622 09/10/22 1445 09/10/22 1708 09/11/22 0342  WBC 8.4  --   --   --   CREATININE 0.94  --   --  1.07  LATICACIDVEN  --  3.0* 1.9  --     Estimated Creatinine Clearance: 68.2 mL/min (by C-G formula based on SCr of 1.07 mg/dL).    No Known Allergies  Antimicrobials this admission: 1/16 Zosyn >>   Dose adjustments this admission: N/A  Microbiology results: 1/15 Sputum: few GPC, few GNR  1/15 MRSA PCR: negative  Thank you for allowing pharmacy to be a part of this patient's care.  Gretel Acre, PharmD PGY1 Pharmacy Resident 09/11/2022 8:28 AM

## 2022-09-11 NOTE — Consult Note (Addendum)
PHARMACY CONSULT NOTE - ELECTROLYTES  Pharmacy Consult for Electrolyte Monitoring and Replacement   Recent Labs: Potassium (mmol/L)  Date Value  09/11/2022 4.1  09/15/2012 4.4   Magnesium (mg/dL)  Date Value  09/11/2022 1.8   Calcium (mg/dL)  Date Value  09/11/2022 8.0 (L)   Calcium, Total (mg/dL)  Date Value  09/15/2012 9.4   Albumin (g/dL)  Date Value  09/11/2022 3.0 (L)  09/15/2012 4.2   Phosphorus (mg/dL)  Date Value  09/11/2022 3.8   Sodium (mmol/L)  Date Value  09/11/2022 138  09/15/2012 146 (H)   Corrected Ca: 8.8 mg/dL  Assessment  Miguel [name] is a 72 y.o. male presenting with SOB, chest pain. PMH significant for TUD (current smoker), sCHF (EF 25-30% 06/2018), CVA, depression, schizophrenia. Pharmacy has been consulted to monitor and replace electrolytes.  Diet: NPO  Goal of Therapy: Electrolytes WNL  Plan:  Potassium: 3.9 >> 4.1, no replacement needed Magnesium: 2.0 >> 1.8, no replacement needed Phosphorus: 4.0 >> 3.8, no replacement needed Check BMP, Mg, Phos with AM labs  Thank you for allowing pharmacy to be a part of this patient's care.  Gretel Acre, PharmD PGY1 Pharmacy Resident 09/11/2022 7:05 AM

## 2022-09-11 NOTE — Progress Notes (Signed)
*  PRELIMINARY RESULTS* Echocardiogram 2D Echocardiogram has been performed.  Sherrie Sport 09/11/2022, 2:22 PM

## 2022-09-12 DIAGNOSIS — J9600 Acute respiratory failure, unspecified whether with hypoxia or hypercapnia: Secondary | ICD-10-CM | POA: Diagnosis not present

## 2022-09-12 DIAGNOSIS — T405X1A Poisoning by cocaine, accidental (unintentional), initial encounter: Secondary | ICD-10-CM | POA: Diagnosis not present

## 2022-09-12 LAB — CBC WITH DIFFERENTIAL/PLATELET
Abs Immature Granulocytes: 0.03 10*3/uL (ref 0.00–0.07)
Basophils Absolute: 0 10*3/uL (ref 0.0–0.1)
Basophils Relative: 0 %
Eosinophils Absolute: 0 10*3/uL (ref 0.0–0.5)
Eosinophils Relative: 0 %
HCT: 41.9 % (ref 39.0–52.0)
Hemoglobin: 13.2 g/dL (ref 13.0–17.0)
Immature Granulocytes: 0 %
Lymphocytes Relative: 16 %
Lymphs Abs: 1.3 10*3/uL (ref 0.7–4.0)
MCH: 30.8 pg (ref 26.0–34.0)
MCHC: 31.5 g/dL (ref 30.0–36.0)
MCV: 97.9 fL (ref 80.0–100.0)
Monocytes Absolute: 0.7 10*3/uL (ref 0.1–1.0)
Monocytes Relative: 9 %
Neutro Abs: 6.2 10*3/uL (ref 1.7–7.7)
Neutrophils Relative %: 75 %
Platelets: 373 10*3/uL (ref 150–400)
RBC: 4.28 MIL/uL (ref 4.22–5.81)
RDW: 15.7 % — ABNORMAL HIGH (ref 11.5–15.5)
WBC: 8.3 10*3/uL (ref 4.0–10.5)
nRBC: 0 % (ref 0.0–0.2)

## 2022-09-12 LAB — BASIC METABOLIC PANEL
Anion gap: 8 (ref 5–15)
BUN: 24 mg/dL — ABNORMAL HIGH (ref 8–23)
CO2: 27 mmol/L (ref 22–32)
Calcium: 8.2 mg/dL — ABNORMAL LOW (ref 8.9–10.3)
Chloride: 104 mmol/L (ref 98–111)
Creatinine, Ser: 1.11 mg/dL (ref 0.61–1.24)
GFR, Estimated: >60 mL/min (ref >60)
Glucose, Bld: 109 mg/dL — ABNORMAL HIGH (ref 70–99)
Potassium: 4 mmol/L (ref 3.5–5.1)
Sodium: 139 mmol/L (ref 135–145)

## 2022-09-12 LAB — GLUCOSE, CAPILLARY
Glucose-Capillary: 82 mg/dL (ref 70–99)
Glucose-Capillary: 102 mg/dL — ABNORMAL HIGH (ref 70–99)
Glucose-Capillary: 92 mg/dL (ref 70–99)
Glucose-Capillary: 62 mg/dL — ABNORMAL LOW (ref 70–99)
Glucose-Capillary: 62 mg/dL — ABNORMAL LOW (ref 70–99)
Glucose-Capillary: 78 mg/dL (ref 70–99)
Glucose-Capillary: 95 mg/dL (ref 70–99)

## 2022-09-12 LAB — MAGNESIUM: Magnesium: 2.5 mg/dL — ABNORMAL HIGH (ref 1.7–2.4)

## 2022-09-12 LAB — PHOSPHORUS: Phosphorus: 5 mg/dL — ABNORMAL HIGH (ref 2.5–4.6)

## 2022-09-12 MED ORDER — ENSURE ENLIVE PO LIQD
237.0000 mL | Freq: Three times a day (TID) | ORAL | Status: DC
  Administered 2022-09-12 – 2022-09-15 (×9): 237 mL via ORAL

## 2022-09-12 MED ORDER — AMOXICILLIN-POT CLAVULANATE 875-125 MG PO TABS
1.0000 | ORAL_TABLET | Freq: Two times a day (BID) | ORAL | Status: DC
  Administered 2022-09-12 – 2022-09-14 (×5): 1 via ORAL
  Filled 2022-09-12 (×5): qty 1

## 2022-09-12 MED ORDER — ADULT MULTIVITAMIN W/MINERALS CH
1.0000 | ORAL_TABLET | Freq: Every day | ORAL | Status: DC
  Administered 2022-09-13 – 2022-09-15 (×3): 1 via ORAL
  Filled 2022-09-12 (×3): qty 1

## 2022-09-12 NOTE — Progress Notes (Signed)
NAME:  Miguel [name], MRN:  [id number], DOB:  1950/10/15, LOS: 2 ADMISSION DATE:  09/10/2022, CONSULTATION DATE: 09/10/22 REFERRING MD: Dr. Cherylann Banas , CHIEF COMPLAINT: Shortness of Breath   History of Present Illness:  This is a 72 yo male with hx of Current Smoker (Cigarettes) who presented [facility] ER on 01/15 via EMS with c/o acute onset of shortness of breath, wheezing, and chest pain after smoking an unknown drug. He also reported chest tightness and restlessness.  Upon EMS arrival pt hypoxic with O2 sats 88%.  Pt received 125 mg of iv solumedrol and duonebs x2 with slight improvement.  HPI obtained from the chart pt post mechanical intubation.  ED Course Upon arrival to the ER pt alert and oriented but complained of restlessness and chest tightness. Pt severely hypertensive sbp 170 to 200. CXR concerning for  mild cardiomegaly and suspected chronic pulmonary interstitial changes.  Lab results revealed: BNP 713.4/troponin 37/COVID-19 negative/Influenza A&B negative/RSV negative.  EKG showed sinus tachycardia, heart rate 118, no ischemic abnormality.  Pt received bronchodilators and toradol.  He later c/o abdominal/lower back pain and became tachypneic and hypertensive.  He received 40 mg of iv lasix and 1 mg of iv ativan for anxiety.   Post administration pt became hypoxic O2 sats 80-84% with decreased level of consciousness requiring NRB.  Pt subsequently mechanically intubated by ED provider.  PCCM team contacted for ICU admission.    Pertinent  Medical History  Schizophrenia  Depression Current Smoker  Chronic Systolic CHF (Echo 60/05/9322: EF 25 to 30%, high risk for left ventricular mural thrombus given large lesions of severe hypokinesis: per Cardiology recs pt started on coumadin no indication for TEE but outpatient Cardiology follow-up recommended)  CVA with aphasia   Significant Hospital Events: Including procedures, antibiotic start and stop dates in addition to other pertinent  events   01/15: Pt admitted to ICU mechanically intubated  09/11/22 - patient self extubated, has aggitated paranoid schizophrnia, family came by to check on him 09/12/22 - patient is on low dose precedex, we are planning to start PO antipsychotic and have psychiatry evaluation.   He is improving quite well and we are optimizing for [facility] transfer.   Interim History / Subjective:  Pt mechanically intubated sedated with propofol and fentanyl gtts.    Objective   Blood pressure 94/69, pulse (!) 48, temperature 99 F (37.2 C), resp. rate 19, height 6\' 1"  (1.854 m), weight 74 kg, SpO2 98 %.        Intake/Output Summary (Last 24 hours) at 09/12/2022 1035 Last data filed at 09/12/2022 0800 Gross per 24 hour  Intake 868.62 ml  Output 975 ml  Net -106.38 ml    Filed Weights   09/10/22 0624 09/10/22 1415 09/11/22 0400  Weight: 83.9 kg 67 kg 74 kg    Examination: General: Acutely-ill appearing male, NAD mechanically intubated  HENT: Supple, no JVD Lungs: Rhonchi throughout, eve, non labored  Cardiovascular: NSR, s1s2, no r/g, 2+ radial/1+ distal pulses, no edema  Abdomen: +BS x4, obese, soft, non distended  Extremities: Normal bulk and tone  Neuro: Sedated, not following commands, withdraws from stimulation, bilateral pupils pinpoint/sluggish  GU: Indwelling foley catheter draining yellow urine   Resolved Hospital Problem list     Assessment & Plan:   Acute hypoxic respiratory failure suspect secondary to flash pulmonary edema in the setting of hypertensive urgency following drug use  Mechanical intubation  Hx: Current smoker  - Full vent support for now: vent settings  reviewed and established  - Continue lung protective strategies  - SBT once all parameters met  - VAP bundle implemented  - Intermittent CXR and ABG - Respiratory cultures results pending   - Maintain RASS goal 0 to -1 - PAD protocol to maintain RASS goal: Prn propofol and fentanyl gtts to maintain RASS goal  - WUA  daily  - Smoking cessation counseling provided   Elevated troponin's likely demand ischemia Angina likely secondary to cocaine use  Hypertensive urgency~resolved   Acute on chronic systolic CHF  Hypotension secondary to sedating medication  Chronic Systolic CHF  Hx: CVA  Echo 07/13/2018: EF 25 to 30%, high risk for left ventricular mural thrombus given large lesions of severe hypokinesis: per Cardiology recs pt started on coumadin no indication for TEE but outpatient Cardiology follow-up recommended.  It appears pt did not follow-up and has been noncompliant with coumadin  - Continuous telemetry monitoring  - Trend troponin's until peaked  - Prn levophed gtt to maintain map >65  - Echo pending  - Hold outpatient enalapril and lasix for now due to marginal bps  - Resume outpatient atorvastatin  - CTA Chest to r/o PE   Endo: - CBG's q4hrs  - Follow hypoglycemic/hyperglycemic protocol   Hypothermia  - Bare hugger to maintain normothermia  - R/o sepsis   Polysubstance abuse Hx: Depression and schizophrenia  - Once extubated will provide polysubstance cessation counseling  - Hold outpatient abilify for now   Best Practice (right click and "Reselect all SmartList Selections" daily)   Diet/type: NPO; Will place dietitian consult for TF's  DVT prophylaxis: LMWH GI prophylaxis: H2B Lines: N/A Foley:  Yes, and it is still needed Code Status:  full code Last date of multidisciplinary goals of care discussion [N/A]  Labs   CBC: Recent Labs  Lab 09/10/22 0622 09/12/22 0526  WBC 8.4 8.3  NEUTROABS 4.9 6.2  HGB 14.3 13.2  HCT 44.4 41.9  MCV 96.5 97.9  PLT 505* 373     Basic Metabolic Panel: Recent Labs  Lab 09/10/22 0622 09/10/22 0913 09/11/22 0342 09/12/22 0526  NA 141  --  138 139  K 3.9  --  4.1 4.0  CL 103  --  103 104  CO2 27  --  26 27  GLUCOSE 123*  --  120* 109*  BUN 14  --  21 24*  CREATININE 0.94  --  1.07 1.11  CALCIUM 8.4*  --  8.0* 8.2*  MG  --   2.0 1.8 2.5*  PHOS 4.0  --  3.8 5.0*    GFR: Estimated Creatinine Clearance: 65.7 mL/min (by C-G formula based on SCr of 1.11 mg/dL). Recent Labs  Lab 09/10/22 0622 09/10/22 1310 09/10/22 1445 09/10/22 1708 09/12/22 0526  PROCALCITON  --  <0.10  --   --   --   WBC 8.4  --   --   --  8.3  LATICACIDVEN  --   --  3.0* 1.9  --      Liver Function Tests: Recent Labs  Lab 09/10/22 0913 09/11/22 0342  AST  --  21  ALT  --  19  ALKPHOS  --  44  BILITOT  --  1.0  PROT  --  5.9*  ALBUMIN 3.8 3.0*    No results for input(s): "LIPASE", "AMYLASE" in the last 168 hours. No results for input(s): "AMMONIA" in the last 168 hours.  ABG    Component Value Date/Time   PHART 7.45 09/10/2022  1134   PCO2ART 46 09/10/2022 1134   PO2ART 121 (H) 09/10/2022 1134   HCO3 32.0 (H) 09/10/2022 1134   O2SAT 99.3 09/10/2022 1134     Coagulation Profile: No results for input(s): "INR", "PROTIME" in the last 168 hours.  Cardiac Enzymes: Recent Labs  Lab 09/10/22 1445  CKTOTAL 158     HbA1C: Hgb A1c MFr Bld  Date/Time Value Ref Range Status  07/14/2018 04:29 AM 5.8 (H) 4.8 - 5.6 % Final    Comment:    (NOTE) Pre diabetes:          5.7%-6.4% Diabetes:              >6.4% Glycemic control for   <7.0% adults with diabetes   07/13/2018 04:25 AM 6.2 (H) 4.8 - 5.6 % Final    Comment:    (NOTE) Pre diabetes:          5.7%-6.4% Diabetes:              >6.4% Glycemic control for   <7.0% adults with diabetes     CBG: Recent Labs  Lab 09/11/22 1730 09/11/22 1950 09/11/22 2328 09/12/22 0410 09/12/22 0842  GLUCAP 84 89 117* 82 102*     Review of Systems:   Unable to assess pt mechanically intubated   Past Medical History:  He,  has a past medical history of Schizophrenia ([place]).   Surgical History:   Past Surgical History:  Procedure Laterality Date   APPENDECTOMY       Social History:   reports that he has never smoked. He has never used smokeless tobacco. He  reports that he does not drink alcohol and does not use drugs.   Family History:  His family history is not on file.   Allergies No Known Allergies   Home Medications  Prior to Admission medications   Medication Sig Start Date End Date Taking? Authorizing Provider  ARISTADA 882 MG/3.2ML prefilled syringe Inject 882 mg into the muscle every 30 (thirty) days. 08/23/22  Yes [provider]  ABILIFY MAINTENA 400 MG PRSY prefilled syringe Inject 400 mg into the muscle every 30 (thirty) days. 06/27/18   [provider]  atorvastatin (LIPITOR) 40 MG tablet Take 1 tablet (40 mg total) by mouth daily at 6 PM. 07/15/18   Demetrios Loll, MD  benzonatate [facility] PERLES) 100 MG capsule Take 1-2 tabs TID prn cough 10/09/21   Menshew, Dannielle Karvonen, PA-C  enalapril (VASOTEC) 2.5 MG tablet Take 1 tablet (2.5 mg total) by mouth daily. 07/15/18   Demetrios Loll, MD  furosemide (LASIX) 40 MG tablet Take 1 tablet (40 mg total) by mouth daily. 06/19/22 06/19/23  Lavonia Drafts, MD  warfarin (COUMADIN) 5 MG tablet Take 1 tablet (5 mg total) by mouth daily. 07/15/18 08/14/18  Demetrios Loll, MD     Critical care provider statement:   Total critical care time: 33 minutes   Performed by: Lanney Gins MD   Critical care time was exclusive of separately billable procedures and treating other patients.   Critical care was necessary to treat or prevent imminent or life-threatening deterioration.   Critical care was time spent personally by me on the following activities: development of treatment plan with patient and/or surrogate as well as nursing, discussions with consultants, evaluation of patient's response to treatment, examination of patient, obtaining history from patient or surrogate, ordering and performing treatments and interventions, ordering and review of laboratory studies, ordering and review of radiographic studies, pulse oximetry and re-evaluation  of patient's condition.    Vida Rigger,  M.D.  Pulmonary & Critical Care Medicine

## 2022-09-12 NOTE — Consult Note (Addendum)
1141: Received consult to see patient. Writer and accompanying PMHNP went up to evaluate patient. Patient is currently working with OT. Will re-attempt to evaluate at a later time.     1315: Writer and accompanying PMHNP went up a second time to evaluate patient. Patient observed sitting in geri-chair a the bedside asleep. Spoke with assigned RN, no acute behavioral issues or concerns reported a this time.   Writer communicated with Dewaine Conger, NP via secure chat. Informed Dewaine Conger, NP writer and accompanying PMHNP went up to see patient and he was working with OT. Patient did not appear to be in any psychiatric distress. He was calm and cooperative with OT. According to his chart he has continuity of mental health services with [facility] and his last psych injection was received 08/31/22. Dewaine Conger, NP reported yesterday patient was extremely agitated following self extubation which required high dose Precedex, which is being weaning slowly today. Dewaine Conger, NP wanted recommendations for meds given borderline/prolonged QTc. Dewaine Conger, NP was informed due to patient's prolonged QTC, for extreme agitation we recommend the nurse contacting the provider for one time dose of Haldol 2.5 mg. We do not recommend adding the Haldol PRN as part of his regimen at this time.    Julien Berryman H. Richardson Landry, PMHNP

## 2022-09-12 NOTE — Consult Note (Signed)
Pharmacy Antibiotic Note  Miguel [name] is a 72 y.o. male admitted on 09/10/2022 with pneumonia. PMH significant for TUD (current smoker), sCHF (EF <20% 08/2022), CVA, depression, schizophrenia. Pharmacy has been consulted for Zosyn dosing.  Plan: Day 2 of antibiotics Start Zosyn 3.375 g IV Q8H Continue to monitor renal function and follow culture results   Height: 6\' 1"  (185.4 cm) Weight: 74 kg (163 lb 2.3 oz) IBW/kg (Calculated) : 79.9  Temp (24hrs), Avg:99.8 F (37.7 C), Min:99 F (37.2 C), Max:100.2 F (37.9 C)  Recent Labs  Lab 09/10/22 0622 09/10/22 1445 09/10/22 1708 09/11/22 0342 09/12/22 0526  WBC 8.4  --   --   --  8.3  CREATININE 0.94  --   --  1.07 1.11  LATICACIDVEN  --  3.0* 1.9  --   --      Estimated Creatinine Clearance: 65.7 mL/min (by C-G formula based on SCr of 1.11 mg/dL).    No Known Allergies  Antimicrobials this admission: 1/16 Zosyn >>   Dose adjustments this admission: N/A  Microbiology results: 1/15 Sputum: few GPC, few GNR  1/15 MRSA PCR: negative  Thank you for allowing pharmacy to be a part of this patient's care.  Gretel Acre, PharmD PGY1 Pharmacy Resident 09/12/2022 8:04 AM

## 2022-09-12 NOTE — Consult Note (Signed)
PHARMACY CONSULT NOTE - ELECTROLYTES  Pharmacy Consult for Electrolyte Monitoring and Replacement   Recent Labs: Potassium (mmol/L)  Date Value  09/12/2022 4.0  09/15/2012 4.4   Magnesium (mg/dL)  Date Value  09/12/2022 2.5 (H)   Calcium (mg/dL)  Date Value  09/12/2022 8.2 (L)   Calcium, Total (mg/dL)  Date Value  09/15/2012 9.4   Albumin (g/dL)  Date Value  09/11/2022 3.0 (L)  09/15/2012 4.2   Phosphorus (mg/dL)  Date Value  09/12/2022 5.0 (H)   Sodium (mmol/L)  Date Value  09/12/2022 139  09/15/2012 146 (H)   Corrected Ca: 8.8 mg/dL  Assessment  Miguel [name] is a 72 y.o. male presenting with SOB, chest pain. PMH significant for TUD (current smoker), sCHF (EF 25-30% 06/2018), CVA, depression, schizophrenia. Pharmacy has been consulted to monitor and replace electrolytes.  Diet: NPO  Goal of Therapy: Electrolytes WNL  Plan:  Potassium: 4.1 >> 4.0, no replacement needed Magnesium: 1.8 >> 2.5, elevated, continue to monitor Phosphorus: 3.8 >> 5.0, elevated, continue to monitor Check BMP, Mg, Phos with AM labs  Thank you for allowing pharmacy to be a part of this patient's care.  Gretel Acre, PharmD PGY1 Pharmacy Resident 09/12/2022 7:57 AM

## 2022-09-12 NOTE — TOC Initial Note (Signed)
Transition of Care [facility]) - Initial/Assessment Note    Patient Details  Name: Miguel [name] MRN: [id number] Date of Birth: Sep 25, 1950  Transition of Care [facility]) CM/SW Contact:    Shelbie Hutching, RN Phone Number: 09/12/2022, 11:10 AM  Clinical Narrative:                 Patient admitted to the hospital with acute respiratory failure and acute on chronic systolic CHF.  Patient required intubation and then he self extubated yesterday in the ICU.  Patient is currently on [place] 2L.  RNCM met with patient at the bedside.  He is sitting in the bedside chair.  He asks when he can leave.  He does not remember what happened yesterday except that he could not breath and he felt like he was being attacked by 9 nurses and that is was traumatic.  Patient is in good spirits today.   Patient lives alone, reports being independent.  He has psychiatric services with [facility] and a peer support person that checks on him weekly.  He says that he is on a monthly injection for his schizophrenia and that is the only medication he takes for that.   Patient reports that he can have someone pick him up at discharge.    No TOC needs identified.    Expected Discharge Plan: Home/Self Care Barriers to Discharge: Continued Medical Work up   Patient Goals and CMS Choice Patient states their goals for this hospitalization and ongoing recovery are:: Patient wants to go home          Expected Discharge Plan and Services   Discharge Planning Services: CM Consult   Living arrangements for the past 2 months: Single Family Home                 DME Arranged: N/A DME Agency: NA       HH Arranged: NA [place] Agency: NA        Prior Living Arrangements/Services Living arrangements for the past 2 months: Single Family Home Lives with:: Self Patient language and need for interpreter reviewed:: Yes Do you feel safe going back to the place where you live?: Yes      Need for Family Participation in Patient Care: Yes  (Comment) Care giver support system in place?: Yes (comment)   Criminal Activity/Legal Involvement Pertinent to Current Situation/Hospitalization: No - Comment as needed  Activities of Daily Living      Permission Sought/Granted                  Emotional Assessment Appearance:: Appears stated age Attitude/Demeanor/Rapport: Engaged Affect (typically observed): Accepting Orientation: : Oriented to Self, Oriented to Place, Oriented to  Time Alcohol / Substance Use: Illicit Drugs Psych Involvement: Yes (comment), Outpatient Provider  Admission diagnosis:  Acute respiratory failure (HCC) [J96.00] Bronchospasm [J98.01] Acute respiratory failure with hypoxia (HCC) [J96.01] Acute on chronic systolic CHF (congestive heart failure) (HCC) [I50.23] Chest pain, unspecified type [R07.9] Patient Active Problem List   Diagnosis Date Noted   Acute on chronic systolic CHF (congestive heart failure) ([place]) 09/10/2022   Acute respiratory failure ([place]) 09/10/2022   Stroke ([place]) 07/12/2018   Schizophrenia ([place]) 04/11/2016   Noncompliance 04/11/2016   PCP:  [place]:   [place], [place] - [place] [address] [place] [address] Phone: [phone] Fax: [phone]  [place] [address] [place] - [place] Phone: [phone] Fax: [place] [address]), [place] - [place]) [place] [address] Phone: [phone] Fax: [phone]     Social Determinants of Health (SDOH) Social History: SDOH Screenings   Tobacco Use: Low Risk  (09/10/2022)   SDOH Interventions:     Readmission Risk Interventions     No data to display

## 2022-09-12 NOTE — Evaluation (Signed)
Occupational Therapy Evaluation Patient Details Name: Miguel [name] MRN: [id number] DOB: 1951/05/12 Today's Date: 09/12/2022   History of Present Illness Pt is a 72 y/o M admitted on 09/10/22 after presenting with acute onset of SOB, wheezing, & chest pain after smoking an unknown drug. Pt severely hypotensive, CXR concerning for mild cardiomegaly & suspected chronic pulmonary interstitial changes. Pt later c/o abdominal/lower back pain and became tachypneic & hypertensive, pt received lasix & ativan & afterwards became hypoxic with decreased LOC requiring NRB, subsequently intubated. Pt self-extubated on 09/11/22 & has agitated paranoid schizophrenia. PMH: current smoker   Clinical Impression   Patient presenting with decreased Ind in self care,balance, functional mobility/transfers, endurance, and safety awareness. Patient reports being independent at baseline without use of AD. Pt lives in boarding house with shared space for bathroom, kitchen,and living area.Patient currently functioning at min guard overall with functional mobility and self care without use of RW. Pt is very motivated to return home. Pt placed on RA during session and appears to be tolerating well. Hand off during gait to PT with pt transitioning well.  Patient will benefit from acute OT to increase overall independence in the areas of ADLs, functional mobility, and safety awareness in order to safely discharge home.      Recommendations for follow up therapy are one component of a multi-disciplinary discharge planning process, led by the attending physician.  Recommendations may be updated based on patient status, additional functional criteria and insurance authorization.   Follow Up Recommendations  Home health OT     Assistance Recommended at Discharge Intermittent Supervision/Assistance  Patient can return home with the following A little help with walking and/or transfers;A little help with  bathing/dressing/bathroom;Help with stairs or ramp for entrance;Assistance with cooking/housework;Assist for transportation    Functional Status Assessment  Patient has had a recent decline in their functional status and demonstrates the ability to make significant improvements in function in a reasonable and predictable amount of time.  Equipment Recommendations  None recommended by OT       Precautions / Restrictions Precautions Precautions: Fall Restrictions Weight Bearing Restrictions: No      Mobility Bed Mobility               General bed mobility comments: seated in recliner chair    Transfers Overall transfer level: Needs assistance Equipment used: None, 1 person hand held assist Transfers: Sit to/from Stand, Bed to chair/wheelchair/BSC Sit to Stand: Supervision, Min guard                  Balance Overall balance assessment: Needs assistance Sitting-balance support: Feet supported Sitting balance-Leahy Scale: Good     Standing balance support: During functional activity, No upper extremity supported Standing balance-Leahy Scale: Fair                             ADL either performed or assessed with clinical judgement   ADL Overall ADL's : Needs assistance/impaired                                             Vision Patient Visual Report: No change from baseline              Pertinent Vitals/Pain Pain Assessment Pain Assessment: No/denies pain     Hand Dominance Right   Extremity/Trunk Assessment  Upper Extremity Assessment Upper Extremity Assessment: Generalized weakness   Lower Extremity Assessment Lower Extremity Assessment: Generalized weakness       Communication Communication Communication: Expressive difficulties   Cognition Arousal/Alertness: Awake/alert Behavior During Therapy: WFL for tasks assessed/performed Overall Cognitive Status: Within Functional Limits for tasks assessed                                  General Comments: Follows simple commands throughout session.     General Comments  Pt on room air, inconsistent pleth waveform & fingers very cold - nurse made aware, pt denied distress re: SOB.            Home Living Family/patient expects to be discharged to:: Private residence Living Arrangements: Alone Available Help at Discharge: Family;Friend(s);Available PRN/intermittently Type of Home: Other(Comment) (boarding house) Home Access: Stairs to enter Entrance Stairs-Number of Steps: 2-3 Entrance Stairs-Rails: Right;Can reach both;Left Home Layout: One level     Bathroom Shower/Tub: Tub/shower unit         Home Equipment: None   Additional Comments: shared bathroom and living room area      Prior Functioning/Environment Prior Level of Function : Independent/Modified Independent                        OT Problem List: Decreased strength;Decreased activity tolerance;Impaired balance (sitting and/or standing);Decreased knowledge of use of DME or AE;Decreased safety awareness      OT Treatment/Interventions: Self-care/ADL training;Therapeutic exercise;Therapeutic activities;Energy conservation;DME and/or AE instruction;Patient/family education;Balance training    OT Goals(Current goals can be found in the care plan section) Acute Rehab OT Goals Patient Stated Goal: to go home OT Goal Formulation: With patient/family Time For Goal Achievement: 09/26/22 Potential to Achieve Goals: Fair ADL Goals Pt Will Perform Grooming: with modified independence;standing Pt Will Perform Lower Body Dressing: with modified independence;sit to/from stand Pt Will Transfer to Toilet: with modified independence;ambulating Pt Will Perform Toileting - Clothing Manipulation and hygiene: with modified independence;sit to/from stand  OT Frequency: Min 2X/week       AM-PAC OT "6 Clicks" Daily Activity     Outcome Measure Help from another person  eating meals?: None Help from another person taking care of personal grooming?: None Help from another person toileting, which includes using toliet, bedpan, or urinal?: A Little Help from another person bathing (including washing, rinsing, drying)?: A Little Help from another person to put on and taking off regular upper body clothing?: None Help from another person to put on and taking off regular lower body clothing?: A Little 6 Click Score: 21   End of Session Nurse Communication: Mobility status;Other (comment) (Pt on RA)  Activity Tolerance: Patient tolerated treatment well Patient left: Other (comment) (hand off to PT during gait)  OT Visit Diagnosis: Unsteadiness on feet (R26.81);Muscle weakness (generalized) (M62.81)                Time: 1751-0258 OT Time Calculation (min): 17 min Charges:  OT General Charges $OT Visit: 1 Visit OT Evaluation $OT Eval Low Complexity: 1 Low OT Treatments $Therapeutic Activity: 8-22 mins  Darleen Crocker, MS, OTR/L , CBIS ascom [phone]  09/12/22, 1:28 PM

## 2022-09-12 NOTE — Evaluation (Signed)
Physical Therapy Evaluation Patient Details Name: Miguel [name] MRN: [id number] DOB: [birth year] Today's Date: 09/12/2022  History of Present Illness  Pt is a 72 y/o M admitted on 09/10/22 after presenting with acute onset of SOB, wheezing, & chest pain after smoking an unknown drug. Pt severely hypotensive, CXR concerning for mild cardiomegaly & suspected chronic pulmonary interstitial changes. Pt later c/o abdominal/lower back pain and became tachypneic & hypertensive, pt received lasix & ativan & afterwards became hypoxic with decreased LOC requiring NRB, subsequently intubated. Pt self-extubated on 09/11/22 & has agitated paranoid schizophrenia. PMH: current smoker  Clinical Impression  Pt seen for PT evaluation with pt received in hand off from OT. Prior to admission pt was independent without AD, residing in an apartment with 2-3 steps with B rails to enter. On this date, pt is able to ambulate increased distances without AD & CGA with slow gait speed, decreased step length BLE, decreased heel strike. Pt with 1 mild LOB with min assist to correct. Anticipate pt can d/c home with HHPT f/u.       Recommendations for follow up therapy are one component of a multi-disciplinary discharge planning process, led by the attending physician.  Recommendations may be updated based on patient status, additional functional criteria and insurance authorization.  Follow Up Recommendations Home health PT      Assistance Recommended at Discharge Set up Supervision/Assistance  Patient can return home with the following  A little help with walking and/or transfers;Assistance with cooking/housework;A little help with bathing/dressing/bathroom;Help with stairs or ramp for entrance;Assist for transportation    Equipment Recommendations None recommended by PT  Recommendations for Other Services       Functional Status Assessment Patient has had a recent decline in their functional status and demonstrates the  ability to make significant improvements in function in a reasonable and predictable amount of time.     Precautions / Restrictions Precautions Precautions: Fall Restrictions Weight Bearing Restrictions: No      Mobility  Bed Mobility                    Transfers                   General transfer comment: not tested, pt received during gait in exchange from OT, left sitting in recliner    Ambulation/Gait Ambulation/Gait assistance: Min guard Gait Distance (Feet): 150 Feet Assistive device: None Gait Pattern/deviations: Decreased step length - right, Decreased step length - left, Decreased dorsiflexion - right, Decreased dorsiflexion - left, Decreased stride length Gait velocity: decreased     General Gait Details: decreased heel strike BLE  Stairs            Wheelchair Mobility    Modified Rankin (Stroke Patients Only)       Balance Overall balance assessment: Needs assistance Sitting-balance support: Feet supported Sitting balance-Leahy Scale: Good     Standing balance support: During functional activity, No upper extremity supported Standing balance-Leahy Scale: Fair                               Pertinent Vitals/Pain Pain Assessment Pain Assessment: No/denies pain    Home Living Family/patient expects to be discharged to:: Private residence Living Arrangements: Alone   Type of Home: Apartment Home Access: Stairs to enter Entrance Stairs-Rails: Right;Can reach both;Left Entrance Stairs-Number of Steps: 2-3   Home Layout: One level Home Equipment: None  Prior Function Prior Level of Function : Independent/Modified Independent                     Hand Dominance        Extremity/Trunk Assessment   Upper Extremity Assessment Upper Extremity Assessment: Generalized weakness    Lower Extremity Assessment Lower Extremity Assessment: Generalized weakness       Communication      Cognition  Arousal/Alertness: Awake/alert Behavior During Therapy: WFL for tasks assessed/performed Overall Cognitive Status: Within Functional Limits for tasks assessed                                 General Comments: Follows simple commands throughout session.        General Comments General comments (skin integrity, edema, etc.): Pt on room air, inconsistent pleth waveform & fingers very cold - nurse made aware, pt denied distress re: SOB.    Exercises     Assessment/Plan    PT Assessment Patient needs continued PT services  PT Problem List Decreased strength;Cardiopulmonary status limiting activity;Decreased balance;Decreased mobility;Decreased activity tolerance;Decreased knowledge of use of DME;Decreased safety awareness;Decreased cognition       PT Treatment Interventions DME instruction;Therapeutic exercise;Gait training;Balance training;Stair training;Neuromuscular re-education;Functional mobility training;Patient/family education;Therapeutic activities;Cognitive remediation    PT Goals (Current goals can be found in the Care Plan section)  Acute Rehab PT Goals Patient Stated Goal: none stated PT Goal Formulation: With patient Time For Goal Achievement: 09/26/22 Potential to Achieve Goals: Good    Frequency Min 2X/week     Co-evaluation               AM-PAC PT "6 Clicks" Mobility  Outcome Measure Help needed turning from your back to your side while in a flat bed without using bedrails?: None Help needed moving from lying on your back to sitting on the side of a flat bed without using bedrails?: A Little Help needed moving to and from a bed to a chair (including a wheelchair)?: A Little Help needed standing up from a chair using your arms (e.g., wheelchair or bedside chair)?: A Little Help needed to walk in hospital room?: A Little Help needed climbing 3-5 steps with a railing? : A Little 6 Click Score: 19    End of Session   Activity Tolerance:  Patient tolerated treatment well Patient left: in chair;with call bell/phone within reach;with family/visitor present Nurse Communication: Mobility status (O2) PT Visit Diagnosis: Unsteadiness on feet (R26.81);Muscle weakness (generalized) (M62.81)    Time: 5366-4403 PT Time Calculation (min) (ACUTE ONLY): 10 min   Charges:   PT Evaluation $PT Eval Moderate Complexity: Rouse, PT, DPT 09/12/22, 12:02 PM   Waunita Schooner 09/12/2022, 12:01 PM

## 2022-09-13 ENCOUNTER — Inpatient Hospital Stay: Payer: Medicare Other

## 2022-09-13 DIAGNOSIS — J90 Pleural effusion, not elsewhere classified: Secondary | ICD-10-CM | POA: Insufficient documentation

## 2022-09-13 DIAGNOSIS — I161 Hypertensive emergency: Secondary | ICD-10-CM | POA: Diagnosis present

## 2022-09-13 DIAGNOSIS — F1410 Cocaine abuse, uncomplicated: Secondary | ICD-10-CM | POA: Diagnosis not present

## 2022-09-13 DIAGNOSIS — I5023 Acute on chronic systolic (congestive) heart failure: Secondary | ICD-10-CM | POA: Diagnosis not present

## 2022-09-13 DIAGNOSIS — J9601 Acute respiratory failure with hypoxia: Secondary | ICD-10-CM | POA: Diagnosis not present

## 2022-09-13 DIAGNOSIS — T405X1A Poisoning by cocaine, accidental (unintentional), initial encounter: Secondary | ICD-10-CM | POA: Diagnosis not present

## 2022-09-13 DIAGNOSIS — J9600 Acute respiratory failure, unspecified whether with hypoxia or hypercapnia: Secondary | ICD-10-CM | POA: Diagnosis not present

## 2022-09-13 LAB — CBC
HCT: 48.1 % (ref 39.0–52.0)
Hemoglobin: 15.3 g/dL (ref 13.0–17.0)
MCH: 31 pg (ref 26.0–34.0)
MCHC: 31.8 g/dL (ref 30.0–36.0)
MCV: 97.4 fL (ref 80.0–100.0)
Platelets: 400 10*3/uL (ref 150–400)
RBC: 4.94 MIL/uL (ref 4.22–5.81)
RDW: 15.6 % — ABNORMAL HIGH (ref 11.5–15.5)
WBC: 9.8 10*3/uL (ref 4.0–10.5)
nRBC: 0 % (ref 0.0–0.2)

## 2022-09-13 LAB — MAGNESIUM: Magnesium: 2.6 mg/dL — ABNORMAL HIGH (ref 1.7–2.4)

## 2022-09-13 LAB — CULTURE, RESPIRATORY W GRAM STAIN

## 2022-09-13 LAB — LACTATE DEHYDROGENASE: LDH: 236 U/L — ABNORMAL HIGH (ref 98–192)

## 2022-09-13 LAB — GLUCOSE, CAPILLARY
Glucose-Capillary: 105 mg/dL — ABNORMAL HIGH (ref 70–99)
Glucose-Capillary: 121 mg/dL — ABNORMAL HIGH (ref 70–99)
Glucose-Capillary: 98 mg/dL (ref 70–99)
Glucose-Capillary: 96 mg/dL (ref 70–99)
Glucose-Capillary: 81 mg/dL (ref 70–99)

## 2022-09-13 LAB — BASIC METABOLIC PANEL
Anion gap: 11 (ref 5–15)
BUN: 19 mg/dL (ref 8–23)
CO2: 25 mmol/L (ref 22–32)
Calcium: 8.5 mg/dL — ABNORMAL LOW (ref 8.9–10.3)
Chloride: 105 mmol/L (ref 98–111)
Creatinine, Ser: 1.1 mg/dL (ref 0.61–1.24)
GFR, Estimated: >60 mL/min (ref >60)
Glucose, Bld: 66 mg/dL — ABNORMAL LOW (ref 70–99)
Potassium: 4 mmol/L (ref 3.5–5.1)
Sodium: 141 mmol/L (ref 135–145)

## 2022-09-13 LAB — PHOSPHORUS: Phosphorus: 3.8 mg/dL (ref 2.5–4.6)

## 2022-09-13 MED ORDER — METOPROLOL SUCCINATE ER 25 MG PO TB24
25.0000 mg | ORAL_TABLET | Freq: Every day | ORAL | Status: DC
  Administered 2022-09-13 – 2022-09-15 (×3): 25 mg via ORAL
  Filled 2022-09-13 (×3): qty 1

## 2022-09-13 MED ORDER — FUROSEMIDE 10 MG/ML IJ SOLN
20.0000 mg | Freq: Once | INTRAMUSCULAR | Status: AC
  Administered 2022-09-13: 20 mg via INTRAVENOUS
  Filled 2022-09-13: qty 2

## 2022-09-13 NOTE — Consult Note (Addendum)
Pharmacy Antibiotic Note  Miguel [name] is a 72 y.o. male admitted on 09/10/2022 with pneumonia. PMH significant for TUD (current smoker), sCHF (EF <20% 08/2022), CVA, depression, schizophrenia.  Plan: Day 3 of antibiotics Discontinue Zosyn  Start Augmentin 875/125 mg BID Continue to monitor renal function and follow culture results  Pharmacy will sign off and continue to monitor peripherally  Height: 6\' 1"  (185.4 cm) Weight: 74 kg (163 lb 2.3 oz) IBW/kg (Calculated) : 79.9  Temp (24hrs), Avg:98.3 F (36.8 C), Min:97.7 F (36.5 C), Max:98.9 F (37.2 C)  Recent Labs  Lab 09/10/22 0622 09/10/22 1445 09/10/22 1708 09/11/22 0342 09/12/22 0526 09/13/22 0520  WBC 8.4  --   --   --  8.3 9.8  CREATININE 0.94  --   --  1.07 1.11 1.10  LATICACIDVEN  --  3.0* 1.9  --   --   --      Estimated Creatinine Clearance: 66.3 mL/min (by C-G formula based on SCr of 1.1 mg/dL).    No Known Allergies  Antimicrobials this admission: 1/16 Zosyn >> 1/17 1/18 Augmentin  Dose adjustments this admission: N/A  Microbiology results: 1/15 Sputum: few GPC, few GNR  1/15 MRSA PCR: negative  Thank you for allowing pharmacy to be a part of this patient's care.  Gretel Acre, PharmD PGY1 Pharmacy Resident 09/12/2022 12:23 PM

## 2022-09-13 NOTE — Progress Notes (Signed)
Progress Note    MJ WALT  [id number] DOB: [birth year]  DOA: 09/10/2022 PCP: [place]      Brief Narrative:    Medical records reviewed and are as summarized below:  MORDECAI OO is a 72 y.o. male with past medical history significant for tobacco use disorder, cocaine use disorder, schizophrenia, who presented to the hospital with shortness of breath, wheezing and chest pain after smoking an unknown drug.  Reportedly, when EMS arrived, he was hypoxic with oxygen saturation of 88%.  He was given Solu-Medrol and DuoNebs and was brought to the emergency department.    ED course He complained of restlessness and chest tightness in the emergency department.  He was hypotensive with systolic BP in the range of 170-200. CXR concerning for mild cardiomegaly and suspected chronic pulmonary interstitial changes. Lab results revealed: BNP 713.4/troponin 37/COVID-19 negative/Influenza A&B negative/RSV negative. EKG showed sinus tachycardia, heart rate 118, no ischemic abnormality. Pt received bronchodilators and toradol. He later c/o abdominal/lower back pain and became tachypneic and hypertensive. He received 40 mg of iv lasix and 1 mg of iv ativan for anxiety. Post administration pt became hypoxic O2 sats 80-84% with decreased level of consciousness requiring NRB. Pt subsequently mechanically intubated by ED provider. PCCM team contacted for ICU admission.   He was admitted to the ICU for acute hypoxic respiratory failure secondary to flash pulmonary edema in the setting of hypertensive emergency following drug use.  Patient said that he had used cocaine prior to admission.  He was treated with IV Lasix.  He was also treated with empiric IV antibiotics.  He was hypotensive and required IV norepinephrine infusion for refractory hypotension. He required IV Precedex for sedation because of severe agitation.  He self extubated on 09/11/2022.  Subsequently, he was  transferred [facility] on 09/13/2022.     Assessment/Plan:   Principal Problem:   Acute on chronic systolic CHF (congestive heart failure) (HCC) Active Problems:   Schizophrenia (HCC)   Acute respiratory failure with hypoxia (HCC)   Hypertensive emergency   Bilateral pleural effusion   Cocaine use disorder (HCC)    Body mass index is 21.52 kg/m.    Acute on chronic systolic and diastolic CHF: IV Lasix 20 mg x 1 dose today.  Start metoprolol XL 25 mg daily. 2D echo on 09/11/2022 showed EF estimated at less than 123456, grade 2 diastolic dysfunction, severe RV dysfunction.  Previous 2D echo on 07/13/2018 showed EF estimated at 25 to 30%.   Bilateral pleural effusion on CT chest: Thoracentesis was ordered today.  However, ultrasound imaging was done prior to thoracentesis showed only trace amount of fluid so thoracentesis was not performed.   Acute hypoxic respiratory failure: Self extubated on 09/11/2022.  He is tolerating room air. Initially treated with Zosyn for suspected pneumonia.  However, no evidence of pneumonia on CTA chest.   Schizophrenia with recent agitation.  He is off of Precedex infusion.  Follow-up with psychiatrist.   Cocaine use disorder: Patient has been advised to avoid cocaine.  He said that he is ready to quit cocaine because of recent events.   Hypotension: Resolved.  Initially required Levophed drip.   Hypoglycemia: Resolved.  Encourage adequate oral intake.   Transfer from stepdown unit to MedSurg unit.   Diet Order             Diet Heart Room service appropriate? Yes; Fluid consistency: Thin  Diet effective now  Consultants: Intensivist Psychiatrist  Procedures: Intubation and mechanical ventilation    Medications:    amoxicillin-clavulanate  1 tablet Oral Q12H   atorvastatin  40 mg Per Tube q1800   Chlorhexidine Gluconate Cloth  6 each Topical Q0600   enoxaparin  (LOVENOX) injection  40 mg Subcutaneous Q24H   feeding supplement  237 mL Oral TID BM   metoprolol succinate  25 mg Oral Daily   multivitamin with minerals  1 tablet Oral Daily   mouth rinse  15 mL Mouth Rinse 4 times per day   Continuous Infusions:  sodium chloride Stopped (09/11/22 1152)   famotidine (PEPCID) IV 20 mg (09/13/22 1005)     Anti-infectives (From admission, onward)    Start     Dose/Rate Route Frequency Ordered Stop   09/12/22 1145  amoxicillin-clavulanate (AUGMENTIN) 875-125 MG per tablet 1 tablet        1 tablet Oral Every 12 hours 09/12/22 1045 09/16/22 0959   09/11/22 0915  piperacillin-tazobactam (ZOSYN) IVPB 3.375 g  Status:  Discontinued        3.375 g 12.5 mL/hr over 240 Minutes Intravenous Every 8 hours 09/11/22 0824 09/12/22 2108              Family Communication/Anticipated D/C date and plan/Code Status   DVT prophylaxis: enoxaparin (LOVENOX) injection 40 mg Start: 09/10/22 2200 Place and maintain sequential compression device Start: 09/10/22 1729 SCDs Start: 09/10/22 1058     Code Status: Full Code  Family Communication: None Disposition Plan: Plan to discharge home in 2 to 3 days   Status is: Inpatient Remains inpatient appropriate because: Recent respiratory failure requiring intubation and mechanical ventilation       Subjective:   Interval events noted.  No shortness of breath or chest pain.  Objective:    Vitals:   09/13/22 0400 09/13/22 0500 09/13/22 0600 09/13/22 0800  BP:    (!) 155/89  Pulse: 83  82   Resp: (!) 22 (!) 27 (!) 21 (!) 22  Temp:    97.7 F (36.5 C)  TempSrc:    Oral  SpO2: 99%  100%   Weight:      Height:       No data found.   Intake/Output Summary (Last 24 hours) at 09/13/2022 1211 Last data filed at 09/13/2022 0500 Gross per 24 hour  Intake 1953.65 ml  Output 3425 ml  Net -1471.35 ml   Filed Weights   09/10/22 0624 09/10/22 1415 09/11/22 0400  Weight: 83.9 kg 67 kg 74 kg     Exam:  GEN: NAD SKIN: Warm and dry EYES: No pallor or icterus ENT: MMM CV: RRR PULM: Decreased air entry at the lung bases, no wheezing or rales heard ABD: soft, ND, NT, +BS CNS: AAO x 3, non focal EXT: No edema or tenderness PSYCH: Calm and cooperative during this encounter      Data Reviewed:   I have personally reviewed following labs and imaging studies:  Labs: Labs show the following:   Basic Metabolic Panel: Recent Labs  Lab 09/10/22 0622 09/10/22 0913 09/11/22 0342 09/12/22 0526 09/13/22 0520  NA 141  --  138 139 141  K 3.9  --  4.1 4.0 4.0  CL 103  --  103 104 105  CO2 27  --  26 27 25   GLUCOSE 123*  --  120* 109* 66*  BUN 14  --  21 24* 19  CREATININE 0.94  --  1.07 1.11 1.10  CALCIUM 8.4*  --  8.0* 8.2* 8.5*  MG  --  2.0 1.8 2.5* 2.6*  PHOS 4.0  --  3.8 5.0* 3.8   GFR Estimated Creatinine Clearance: 66.3 mL/min (by C-G formula based on SCr of 1.1 mg/dL). Liver Function Tests: Recent Labs  Lab 09/10/22 0913 09/11/22 0342  AST  --  21  ALT  --  19  ALKPHOS  --  44  BILITOT  --  1.0  PROT  --  5.9*  ALBUMIN 3.8 3.0*   No results for input(s): "LIPASE", "AMYLASE" in the last 168 hours. No results for input(s): "AMMONIA" in the last 168 hours. Coagulation profile No results for input(s): "INR", "PROTIME" in the last 168 hours.  CBC: Recent Labs  Lab 09/10/22 0622 09/12/22 0526 09/13/22 0520  WBC 8.4 8.3 9.8  NEUTROABS 4.9 6.2  --   HGB 14.3 13.2 15.3  HCT 44.4 41.9 48.1  MCV 96.5 97.9 97.4  PLT 505* 373 400   Cardiac Enzymes: Recent Labs  Lab 09/10/22 1445  CKTOTAL 158   BNP (last 3 results) No results for input(s): "PROBNP" in the last 8760 hours. CBG: Recent Labs  Lab 09/12/22 1601 09/12/22 1857 09/12/22 2129 09/12/22 2312 09/13/22 0757  GLUCAP 62* 62* 78 95 105*   D-Dimer: No results for input(s): "DDIMER" in the last 72 hours. Hgb A1c: No results for input(s): "HGBA1C" in the last 72 hours. Lipid  Profile: No results for input(s): "CHOL", "HDL", "LDLCALC", "TRIG", "CHOLHDL", "LDLDIRECT" in the last 72 hours. Thyroid function studies: No results for input(s): "TSH", "T4TOTAL", "T3FREE", "THYROIDAB" in the last 72 hours.  Invalid input(s): "FREET3" Anemia work up: No results for input(s): "VITAMINB12", "FOLATE", "FERRITIN", "TIBC", "IRON", "RETICCTPCT" in the last 72 hours. Sepsis Labs: Recent Labs  Lab 09/10/22 0622 09/10/22 1310 09/10/22 1445 09/10/22 1708 09/12/22 0526 09/13/22 0520  PROCALCITON  --  <0.10  --   --   --   --   WBC 8.4  --   --   --  8.3 9.8  LATICACIDVEN  --   --  3.0* 1.9  --   --     Microbiology Recent Results (from the past 240 hour(s))  Resp panel by RT-PCR (RSV, Flu A&B, Covid) Anterior Nasal Swab     Status: None   Collection Time: 09/10/22  6:23 AM   Specimen: Anterior Nasal Swab  Result Value Ref Range Status   SARS Coronavirus 2 by RT PCR NEGATIVE NEGATIVE Final    Comment: (NOTE) SARS-CoV-2 target nucleic acids are NOT DETECTED.  The SARS-CoV-2 RNA is generally detectable in upper respiratory specimens during the acute phase of infection. The lowest concentration of SARS-CoV-2 viral copies this assay can detect is 138 copies/mL. A negative result does not preclude SARS-Cov-2 infection and should not be used as the sole basis for treatment or other patient management decisions. A negative result may occur with  improper specimen collection/handling, submission of specimen other than nasopharyngeal swab, presence of viral mutation(s) within the areas targeted by this assay, and inadequate number of viral copies(<138 copies/mL). A negative result must be combined with clinical observations, patient history, and epidemiological information. The expected result is Negative.  Fact Sheet for Patients:  EntrepreneurPulse.com.au  Fact Sheet for Healthcare Providers:  IncredibleEmployment.be  This test is  no t yet approved or cleared by the [place] FDA and  has been authorized for detection and/or diagnosis of SARS-CoV-2 by FDA under an Emergency Use Authorization (EUA). This EUA will remain  in effect (meaning  this test can be used) for the duration of the COVID-19 declaration under Section 564(b)(1) of the Act, 21 U.S.C.section 360bbb-3(b)(1), unless the authorization is terminated  or revoked sooner.       Influenza A by PCR NEGATIVE NEGATIVE Final   Influenza B by PCR NEGATIVE NEGATIVE Final    Comment: (NOTE) The Xpert Xpress SARS-CoV-2/FLU/RSV plus assay is intended as an aid in the diagnosis of influenza from Nasopharyngeal swab specimens and should not be used as a sole basis for treatment. Nasal washings and aspirates are unacceptable for Xpert Xpress SARS-CoV-2/FLU/RSV testing.  Fact Sheet for Patients: EntrepreneurPulse.com.au  Fact Sheet for Healthcare Providers: IncredibleEmployment.be  This test is not yet approved or cleared by the [place] FDA and has been authorized for detection and/or diagnosis of SARS-CoV-2 by FDA under an Emergency Use Authorization (EUA). This EUA will remain in effect (meaning this test can be used) for the duration of the COVID-19 declaration under Section 564(b)(1) of the Act, 21 U.S.C. section 360bbb-3(b)(1), unless the authorization is terminated or revoked.     Resp Syncytial Virus by PCR NEGATIVE NEGATIVE Final    Comment: (NOTE) Fact Sheet for Patients: EntrepreneurPulse.com.au  Fact Sheet for Healthcare Providers: IncredibleEmployment.be  This test is not yet approved or cleared by the [place] FDA and has been authorized for detection and/or diagnosis of SARS-CoV-2 by FDA under an Emergency Use Authorization (EUA). This EUA will remain in effect (meaning this test can be used) for the duration of the COVID-19 declaration under Section  564(b)(1) of the Act, 21 U.S.C. section 360bbb-3(b)(1), unless the authorization is terminated or revoked.  Performed at [facility], [address] [place], [place] [address]   MRSA Next Gen by PCR, Nasal     Status: None   Collection Time: 09/10/22  2:18 PM   Specimen: Nasal Mucosa; Nasal Swab  Result Value Ref Range Status   MRSA by PCR Next Gen NOT DETECTED NOT DETECTED Final    Comment: (NOTE) The GeneXpert MRSA Assay (FDA approved for NASAL specimens only), is one component of a comprehensive MRSA colonization surveillance program. It is not intended to diagnose MRSA infection nor to guide or monitor treatment for MRSA infections. Test performance is not FDA approved in patients less than 46 years old. Performed at [facility], [address] [place], [place] [address]   Culture, Respiratory w Gram Stain     Status: None (Preliminary result)   Collection Time: 09/10/22  4:43 PM   Specimen: Tracheal Aspirate; Respiratory  Result Value Ref Range Status   Specimen Description   Final    TRACHEAL ASPIRATE Performed at [facility], [address], [place], [place] [address]    Special Requests   Final    NONE Performed at [facility], [address] [place], [place] [address]    Gram Stain   Final    RARE WBC PRESENT, PREDOMINANTLY PMN FEW GRAM POSITIVE COCCI FEW GRAM NEGATIVE RODS    Culture   Final    CULTURE REINCUBATED FOR BETTER GROWTH Performed at [facility], [place] [address] [place], [place] [address]    Report Status PENDING  Incomplete    Procedures and diagnostic studies:  ECHOCARDIOGRAM COMPLETE  Result Date: 09/11/2022    ECHOCARDIOGRAM REPORT   Patient Name:   KAVI ALMQUIST Date of Exam: 09/11/2022 Medical Rec #:  [id number]         Height:  73.0 in Accession #:    [id number]        Weight:       163.1 lb Date of Birth:  21-May-1951          BSA:          1.973 m Patient Age:    71 years           BP:           106/63 mmHg Patient Gender: M                 HR:           61 bpm. Exam Location:  [facility] Procedure: 2D Echo, Cardiac Doppler and Color Doppler Indications:     Acute respiratory distress R06.03  History:         Patient has prior history of Echocardiogram examinations, most                  recent 07/13/2018. Stroke.  Sonographer:     Cristela Blue Referring Phys:  [id number] Ezequiel Essex Diagnosing Phys: Yvonne Kendall MD IMPRESSIONS  1. Left ventricular ejection fraction, by estimation, is <20%. The left ventricle has severely decreased function. The left ventricle demonstrates global hypokinesis. There is mild left ventricular hypertrophy. Left ventricular diastolic parameters are consistent with Grade II diastolic dysfunction (pseudonormalization). Elevated left atrial pressure.  2. Right ventricular systolic function is severely reduced. The right ventricular size is moderately enlarged.  3. Right atrial size was mildly dilated.  4. Moderate pleural effusion in the left lateral region.  5. The mitral valve is abnormal. Mild mitral valve regurgitation.  6. The aortic valve is tricuspid. There is moderate thickening of the aortic valve. Aortic valve regurgitation is not visualized. Aortic valve sclerosis is present, with no evidence of aortic valve stenosis. FINDINGS  Left Ventricle: Left ventricular ejection fraction, by estimation, is <20%. The left ventricle has severely decreased function. The left ventricle demonstrates global hypokinesis. The left ventricular internal cavity size was normal in size. There is mild left ventricular hypertrophy. Left ventricular diastolic parameters are consistent with Grade II diastolic dysfunction (pseudonormalization). Elevated left atrial pressure. Right Ventricle: The right ventricular size is moderately enlarged. No increase in right ventricular wall thickness. Right ventricular systolic function is severely reduced. Left Atrium: Left atrial size was normal in  size. Right Atrium: Right atrial size was mildly dilated. Pericardium: Trivial pericardial effusion is present. Mitral Valve: The mitral valve is abnormal. There is mild thickening of the mitral valve leaflet(s). Mild mitral valve regurgitation. Tricuspid Valve: The tricuspid valve is normal in structure. Tricuspid valve regurgitation is trivial. Aortic Valve: The aortic valve is tricuspid. There is moderate thickening of the aortic valve. Aortic valve regurgitation is not visualized. Aortic valve sclerosis is present, with no evidence of aortic valve stenosis. Aortic valve mean gradient measures  2.3 mmHg. Aortic valve peak gradient measures 4.5 mmHg. Aortic valve area, by VTI measures 1.49 cm. Pulmonic Valve: The pulmonic valve was normal in structure. Pulmonic valve regurgitation is trivial. No evidence of pulmonic stenosis. Aorta: The aortic root is normal in size and structure. Pulmonary Artery: The pulmonary artery is of normal size. Venous: The inferior vena cava was not well visualized. IAS/Shunts: The interatrial septum was not well visualized. Additional Comments: There is a moderate pleural effusion in the left lateral region.  LEFT VENTRICLE PLAX 2D LVIDd:         5.50 cm      Diastology LVIDs:  4.80 cm      LV e' medial:    5.87 cm/s LV PW:         1.29 cm      LV E/e' medial:  15.1 LV IVS:        1.20 cm      LV e' lateral:   3.81 cm/s LVOT diam:     2.10 cm      LV E/e' lateral: 23.3 LV SV:         31 LV SV Index:   16 LVOT Area:     3.46 cm  LV Volumes (MOD) LV vol d, MOD A2C: 228.0 ml LV vol d, MOD A4C: 111.0 ml LV vol s, MOD A2C: 192.0 ml LV vol s, MOD A4C: 86.0 ml LV SV MOD A2C:     36.0 ml LV SV MOD A4C:     111.0 ml LV SV MOD BP:      26.3 ml RIGHT VENTRICLE RV Basal diam:  5.39 cm RV Mid diam:    7.70 cm RV S prime:     9.57 cm/s TAPSE (M-mode): 1.1 cm LEFT ATRIUM             Index        RIGHT ATRIUM           Index LA diam:        2.50 cm 1.27 cm/m   RA Area:     25.60 cm LA Vol  (A2C):   40.8 ml 20.68 ml/m  RA Volume:   93.70 ml  47.49 ml/m LA Vol (A4C):   31.7 ml 16.07 ml/m LA Biplane Vol: 39.3 ml 19.92 ml/m  AORTIC VALVE AV Area (Vmax):    1.65 cm AV Area (Vmean):   1.55 cm AV Area (VTI):     1.49 cm AV Vmax:           105.77 cm/s AV Vmean:          72.833 cm/s AV VTI:            0.208 m AV Peak Grad:      4.5 mmHg AV Mean Grad:      2.3 mmHg LVOT Vmax:         50.30 cm/s LVOT Vmean:        32.600 cm/s LVOT VTI:          0.089 m LVOT/AV VTI ratio: 0.43  AORTA Ao Root diam: 3.20 cm MITRAL VALVE               TRICUSPID VALVE MV Area (PHT): 3.66 cm    TR Peak grad:   28.1 mmHg MV Decel Time: 207 msec    TR Vmax:        265.00 cm/s MV E velocity: 88.60 cm/s MV A velocity: 55.50 cm/s  SHUNTS MV E/A ratio:  1.60        Systemic VTI:  0.09 m                            Systemic Diam: 2.10 cm Nelva Bush MD Electronically signed by Nelva Bush MD Signature Date/Time: 09/11/2022/6:09:34 PM    Final                LOS: 3 days   Elverda Wendel  [facility]   Pager on [email or web address]. If 7PM-7AM, please contact night-coverage at [email or web address]     09/13/2022, 12:11 PM

## 2022-09-13 NOTE — Procedures (Signed)
Patient presented today for thoracentesis, ultrasound imaging was obtained and revealed trace amount of fluid on the left and no fluid seen on the right, therefore thoracentesis was not performed. Findings discussed with the patient today who verbalized understanding of the plan.   Electronically Signed: Hedy Jacob, PA-C 09/13/2022, 11:42 AM

## 2022-09-13 NOTE — Plan of Care (Signed)
RN attempted to take BP, pt said "take this off of me you're trying to kill me get out of my room" Rn attempted to reinforce patient that staff was there to help and just taking his BP but patient took off BP and said "don't you dare try to put this back on me get out"

## 2022-09-13 NOTE — Progress Notes (Signed)
Pt refuses to wear bp cuff. States that it is trying to cut his arm off.  Also continues to get out of bed and remove tele leads. Gets out of bed without calling out to use the urinal. Removes leads at this time.

## 2022-09-13 NOTE — Consult Note (Signed)
   Heart Failure Nurse Navigator Note  HFrEF <20%.  Mild LVH.  Grade 3 diastolic dysfunction.  Right ventricular systolic function is severely reduced.  Mild right atrial enlargement.  Presented to the emergency room with complaints of chest discomfort and shortness of breath along with a cough and wheezing.  BNP 713.  Chest x-ray revealed mild bilateral pleural effusions and mild pulmonary edema.  UDS was positive for cocaine.  Comorbidities:  Schizophrenia Depression Smoker  Medications:  Atorvastatin 40 mg daily Metoprolol succinate 25 mg daily  Labs:  Sodium 141, potassium 4.0, chloride 105, CO2 25, BUN 19, creatinine 1.10, estimated GFR greater than 60, magnesium 2.6, phosphorus 3.8. Weight not documented Intake 2403 mL Output 3970 mL Blood pressure 146/96   Initial meeting with patient in the ICU.  He is currently sitting up in the chair in no acute distress.  He states that he lives at home by himself but will have an occasional visitor spend the night.  He states that he prepares his own meals.  He does voice that he has food insecurities due to lack of funds.  Most of the foods that he eats are what he can afford such as noodles of noodles, canned soup, hot dogs etc.  Unable to complete teaching as he was taken from the unit for thoracentesis.  Continue to follow along.  Pricilla Riffle RN [facility]

## 2022-09-13 NOTE — Plan of Care (Signed)

## 2022-09-13 NOTE — Progress Notes (Signed)
Physical Therapy Treatment Patient Details Name: Miguel [name] MRN: [id number] DOB: 12-May-1951 Today's Date: 09/13/2022   History of Present Illness Pt is a 72 y/o M admitted on 09/10/22 after presenting with acute onset of SOB, wheezing, & chest pain after smoking an unknown drug. Pt severely hypotensive, CXR concerning for mild cardiomegaly & suspected chronic pulmonary interstitial changes. Pt later c/o abdominal/lower back pain and became tachypneic & hypertensive, pt received lasix & ativan & afterwards became hypoxic with decreased LOC requiring NRB, subsequently intubated. Pt self-extubated on 09/11/22 & has agitated paranoid schizophrenia. PMH: current smoker    PT Comments    Pt was laying in bed upon arrival. " I was about to take a nap but I'll go." He agrees to session and is cooperative throughout. Pt is easily distracted and impulsive at times however able to ambulate 2 laps around unit ~ 400 ft with no AD or UE support. RR elevated but no signs/symptoms of distress or SOB. Occasional unsteadiness observed. Recommend HHPT at DC to address higher level balance deficits while maximizing his safety during ADLs.    Recommendations for follow up therapy are one component of a multi-disciplinary discharge planning process, led by the attending physician.  Recommendations may be updated based on patient status, additional functional criteria and insurance authorization.  Follow Up Recommendations  Home health PT     Assistance Recommended at Discharge Set up Supervision/Assistance  Patient can return home with the following A little help with walking and/or transfers;Assistance with cooking/housework;A little help with bathing/dressing/bathroom;Help with stairs or ramp for entrance;Assist for transportation   Equipment Recommendations  None recommended by PT       Precautions / Restrictions Precautions Precautions: Fall Restrictions Weight Bearing Restrictions: No      Mobility  Bed Mobility Overal bed mobility: Modified Independent  General bed mobility comments: slightly impulsive but no physical assistance required.    Transfers Overall transfer level: Needs assistance Equipment used: None Transfers: Sit to/from Stand Sit to Stand: Supervision, Min guard    General transfer comment: CGA at times due to impulsivity however no physical assistance required    Ambulation/Gait Ambulation/Gait assistance: Min guard Gait Distance (Feet): 400 Feet Assistive device: None Gait Pattern/deviations: Step-through pattern Gait velocity: decreased     General Gait Details: pt is very talkative throughout gait traning. He is easily distracted but overall steady without use of AD. RR elevated in  40s however pt demonstartes no signs of distress or SOB.    Balance Overall balance assessment: Needs assistance Sitting-balance support: Feet supported Sitting balance-Leahy Scale: Good Sitting balance - Comments: no balance deficits in sitting   Standing balance support: During functional activity, No upper extremity supported Standing balance-Leahy Scale: Fair Standing balance comment: Is at high risk of falls due to impuslivity more so than balance deficits       Cognition Arousal/Alertness: Awake/alert Behavior During Therapy: WFL for tasks assessed/performed Overall Cognitive Status: Within Functional Limits for tasks assessed      General Comments: Follows simple commands throughout session.               Pertinent Vitals/Pain Pain Assessment Pain Assessment: No/denies pain     PT Goals (current goals can now be found in the care plan section) Acute Rehab PT Goals Patient Stated Goal: none stated Progress towards PT goals: Progressing toward goals    Frequency    Min 2X/week      PT Plan Current plan remains appropriate  AM-PAC PT "6 Clicks" Mobility   Outcome Measure  Help needed turning from your back to your  side while in a flat bed without using bedrails?: None Help needed moving from lying on your back to sitting on the side of a flat bed without using bedrails?: A Little Help needed moving to and from a bed to a chair (including a wheelchair)?: A Little Help needed standing up from a chair using your arms (e.g., wheelchair or bedside chair)?: A Little Help needed to walk in hospital room?: A Little Help needed climbing 3-5 steps with a railing? : A Little 6 Click Score: 19    End of Session   Activity Tolerance: Patient tolerated treatment well Patient left: with call bell/phone within reach;in bed;with bed alarm set;with nursing/sitter in room Nurse Communication: Mobility status PT Visit Diagnosis: Unsteadiness on feet (R26.81);Muscle weakness (generalized) (M62.81)     Time: 8115-7262 PT Time Calculation (min) (ACUTE ONLY): 12 min  Charges:  $Gait Training: 8-22 mins                     Julaine Fusi PTA 09/13/22, 3:43 PM

## 2022-09-13 NOTE — Progress Notes (Signed)
This RN attempted to call report to receiving unit. Told by receiving RN that she would have to call this RN back for report.

## 2022-09-14 DIAGNOSIS — F250 Schizoaffective disorder, bipolar type: Secondary | ICD-10-CM | POA: Diagnosis present

## 2022-09-14 DIAGNOSIS — T405X1A Poisoning by cocaine, accidental (unintentional), initial encounter: Secondary | ICD-10-CM | POA: Diagnosis not present

## 2022-09-14 DIAGNOSIS — J9600 Acute respiratory failure, unspecified whether with hypoxia or hypercapnia: Secondary | ICD-10-CM | POA: Diagnosis not present

## 2022-09-14 DIAGNOSIS — I5023 Acute on chronic systolic (congestive) heart failure: Secondary | ICD-10-CM | POA: Diagnosis not present

## 2022-09-14 LAB — GLUCOSE, CAPILLARY
Glucose-Capillary: 80 mg/dL (ref 70–99)
Glucose-Capillary: 98 mg/dL (ref 70–99)
Glucose-Capillary: 109 mg/dL — ABNORMAL HIGH (ref 70–99)
Glucose-Capillary: 92 mg/dL (ref 70–99)
Glucose-Capillary: 100 mg/dL — ABNORMAL HIGH (ref 70–99)

## 2022-09-14 MED ORDER — DIVALPROEX SODIUM 500 MG PO DR TAB
500.0000 mg | DELAYED_RELEASE_TABLET | Freq: Two times a day (BID) | ORAL | Status: DC
  Administered 2022-09-14 – 2022-09-15 (×3): 500 mg via ORAL
  Filled 2022-09-14 (×3): qty 1

## 2022-09-14 MED ORDER — LISINOPRIL 10 MG PO TABS
10.0000 mg | ORAL_TABLET | Freq: Every day | ORAL | Status: DC
  Administered 2022-09-14 – 2022-09-15 (×2): 10 mg via ORAL
  Filled 2022-09-14 (×2): qty 1

## 2022-09-14 MED ORDER — ARIPIPRAZOLE 5 MG PO TABS
5.0000 mg | ORAL_TABLET | Freq: Every day | ORAL | Status: DC
  Administered 2022-09-14 – 2022-09-15 (×2): 5 mg via ORAL
  Filled 2022-09-14 (×2): qty 1

## 2022-09-14 NOTE — Care Management Important Message (Signed)
Important Message  Patient Details  Name: Miguel [name] MRN: [id number] Date of Birth: 04/06/1951   Medicare Important Message Given:  Yes     Dannette Barbara 09/14/2022, 12:04 PM

## 2022-09-14 NOTE — Progress Notes (Signed)
Physical Therapy Treatment Patient Details Name: Miguel [name] MRN: [id number] DOB: [birth year] Today's Date: 09/14/2022   History of Present Illness Pt is a 72 y/o M admitted on 09/10/22 after presenting with acute onset of SOB, wheezing, & chest pain after smoking an unknown drug. Pt severely hypotensive, CXR concerning for mild cardiomegaly & suspected chronic pulmonary interstitial changes. Pt later c/o abdominal/lower back pain and became tachypneic & hypertensive, pt received lasix & ativan & afterwards became hypoxic with decreased LOC requiring NRB, subsequently intubated. Pt self-extubated on 09/11/22 & has agitated paranoid schizophrenia. PMH: current smoker    PT Comments    Pt seen for PT tx with pt agreeable. Pt is able to complete bed mobility with mod I, STS independently. Pt toilets without assistance. Pt is able to ambulate 3 laps around nurses station without AD with supervision<>min assist. Pt initially with intermittent scissoring gait & lateral sway, requiring assistance to steady himself. Pt engaged in retrograde gait with CGA. Pt presents with decreased safety awareness overall. Pt negotiates steps with 1 rail & CGA<>supervision. Will continue to follow pt acutely to address high level balance as able, but updated d/c recommendations to no f/u therapy.    Recommendations for follow up therapy are one component of a multi-disciplinary discharge planning process, led by the attending physician.  Recommendations may be updated based on patient status, additional functional criteria and insurance authorization.  Follow Up Recommendations  No PT follow up     Assistance Recommended at Discharge PRN  Patient can return home with the following A little help with walking and/or transfers;Assistance with cooking/housework;A little help with bathing/dressing/bathroom;Help with stairs or ramp for entrance;Assist for transportation   Equipment Recommendations  None recommended by PT     Recommendations for Other Services       Precautions / Restrictions Precautions Precautions: Fall Restrictions Weight Bearing Restrictions: No     Mobility  Bed Mobility Overal bed mobility: Modified Independent             General bed mobility comments: supine>sit with HOB slightly elevated, bed rails    Transfers Overall transfer level: Independent Equipment used: None Transfers: Sit to/from [employer] transfer comment: STS from EOB & toilet without assistance    Ambulation/Gait Ambulation/Gait assistance: Min assist, Supervision Gait Distance (Feet): 600 Feet Assistive device: None Gait Pattern/deviations: Step-through pattern Gait velocity: slightly decreased     General Gait Details: Pt initially requiring CGA<>min assist 2/2 increased lateral sway & intermittent scissoring gait but able to progress to ambulating with supervision.   Stairs Stairs: Yes Stairs assistance: Min guard, Supervision Stair Management: One rail Right, Alternating pattern Number of Stairs: 6     Wheelchair Mobility    Modified Rankin (Stroke Patients Only)       Balance Overall balance assessment: Needs assistance Sitting-balance support: Feet supported Sitting balance-Leahy Scale: Good     Standing balance support: During functional activity, No upper extremity supported Standing balance-Leahy Scale: Fair                              Cognition Arousal/Alertness: Awake/alert Behavior During Therapy: WFL for tasks assessed/performed Overall Cognitive Status: Within Functional Limits for tasks assessed Area of Impairment: Safety/judgement, Awareness                         Safety/Judgement: Decreased  awareness of safety, Decreased awareness of deficits Awareness: Anticipatory   General Comments: Decreased safety awareness, decreased awareness of deficits.        Exercises Other Exercises Other Exercises: Pt  engaged in retrograde gait with CGA with decreased safety awareness; activity focused on high level balance training.    General Comments General comments (skin integrity, edema, etc.): Pt used restroom without assistance during session.      Pertinent Vitals/Pain Pain Assessment Pain Assessment: No/denies pain    Home Living                          Prior Function            PT Goals (current goals can now be found in the care plan section) Acute Rehab PT Goals Patient Stated Goal: none stated PT Goal Formulation: With patient Time For Goal Achievement: 09/26/22 Potential to Achieve Goals: Good Progress towards PT goals: Progressing toward goals    Frequency    Min 2X/week      PT Plan Discharge plan needs to be updated    Co-evaluation              AM-PAC PT "6 Clicks" Mobility   Outcome Measure  Help needed turning from your back to your side while in a flat bed without using bedrails?: None Help needed moving from lying on your back to sitting on the side of a flat bed without using bedrails?: None Help needed moving to and from a bed to a chair (including a wheelchair)?: None Help needed standing up from a chair using your arms (e.g., wheelchair or bedside chair)?: None Help needed to walk in hospital room?: A Little Help needed climbing 3-5 steps with a railing? : A Little 6 Click Score: 22    End of Session   Activity Tolerance: Patient tolerated treatment well Patient left: in bed;with call bell/phone within reach;with bed alarm set Nurse Communication: Mobility status PT Visit Diagnosis: Unsteadiness on feet (R26.81);Muscle weakness (generalized) (M62.81)     Time: 5631-4970 PT Time Calculation (min) (ACUTE ONLY): 16 min  Charges:  $Therapeutic Activity: 8-22 mins                     Lavone Nian, PT, DPT 09/14/22, 3:58 PM   Waunita Schooner 09/14/2022, 3:56 PM

## 2022-09-14 NOTE — Progress Notes (Signed)
NAME:  Miguel [name], MRN:  [id number], DOB:  10/16/1950, LOS: 4 ADMISSION DATE:  09/10/2022, CONSULTATION DATE: 09/10/22 REFERRING MD: Dr. Cherylann Banas , CHIEF COMPLAINT: Shortness of Breath   History of Present Illness:  This is a 72 yo male with hx of Current Smoker (Cigarettes) who presented to [facility] ER on 01/15 via EMS with c/o acute onset of shortness of breath, wheezing, and chest pain after smoking an unknown drug. He also reported chest tightness and restlessness.  Upon EMS arrival pt hypoxic with O2 sats 88%.  Pt received 125 mg of iv solumedrol and duonebs x2 with slight improvement.  HPI obtained from the chart pt post mechanical intubation.  ED Course Upon arrival to the ER pt alert and oriented but complained of restlessness and chest tightness. Pt severely hypertensive sbp 170 to 200. CXR concerning for  mild cardiomegaly and suspected chronic pulmonary interstitial changes.  Lab results revealed: BNP 713.4/troponin 37/COVID-19 negative/Influenza A&B negative/RSV negative.  EKG showed sinus tachycardia, heart rate 118, no ischemic abnormality.  Pt received bronchodilators and toradol.  He later c/o abdominal/lower back pain and became tachypneic and hypertensive.  He received 40 mg of iv lasix and 1 mg of iv ativan for anxiety.   Post administration pt became hypoxic O2 sats 80-84% with decreased level of consciousness requiring NRB.  Pt subsequently mechanically intubated by ED provider.  PCCM team contacted for ICU admission.    Pertinent  Medical History  Schizophrenia  Depression Current Smoker  Chronic Systolic CHF (Echo 83/15/1761: EF 25 to 30%, high risk for left ventricular mural thrombus given large lesions of severe hypokinesis: per Cardiology recs pt started on coumadin no indication for TEE but outpatient Cardiology follow-up recommended)  CVA with aphasia   Significant Hospital Events: Including procedures, antibiotic start and stop dates in addition to other pertinent  events   01/15: Pt admitted to ICU mechanically intubated  09/11/22 - patient self extubated, has aggitated paranoid schizophrnia, family came by to check on him 09/12/22 - patient is on low dose precedex, we are planning to start PO antipsychotic and have psychiatry evaluation.   He is improving quite well and we are optimizing for [facility] transfer.  09/13/22-patient is improved and has continued to be compliant with all therapies. I met with sister and she was very appreciative of care.  Patient smiling during interview is grateful for his care. PCCM will sign off and are available if needed.   Interim History / Subjective:  Pt mechanically intubated sedated with propofol and fentanyl gtts.    Objective   Blood pressure (!) 132/108, pulse 78, temperature 97.6 F (36.4 C), resp. rate 18, height 6\' 1"  (1.854 m), weight 74 kg, SpO2 94 %.        Intake/Output Summary (Last 24 hours) at 09/14/2022 0802 Last data filed at 09/14/2022 0700 Gross per 24 hour  Intake 900 ml  Output 1050 ml  Net -150 ml    Filed Weights   09/10/22 0624 09/10/22 1415 09/11/22 0400  Weight: 83.9 kg 67 kg 74 kg    Examination: General: Acutely-ill appearing male, NAD mechanically intubated  HENT: Supple, no JVD Lungs: Rhonchi throughout, eve, non labored  Cardiovascular: NSR, s1s2, no r/g, 2+ radial/1+ distal pulses, no edema  Abdomen: +BS x4, obese, soft, non distended  Extremities: Normal bulk and tone  Neuro: Sedated, not following commands, withdraws from stimulation, bilateral pupils pinpoint/sluggish  GU: Indwelling foley catheter draining yellow urine   Resolved Hospital Problem list  Assessment & Plan:   Acute hypoxic respiratory failure suspect secondary to flash pulmonary edema in the setting of hypertensive urgency following drug use  Mechanical intubation  Hx: Current smoker  - Smoking cessation counseling provided   Elevated troponin's likely demand ischemia Angina likely secondary to  cocaine use  Hypertensive urgency~resolved   Acute on chronic systolic CHF  Hypotension secondary to sedating medication  Chronic Systolic CHF  Hx: CVA  Echo 07/13/2018: EF 25 to 30%, high risk for left ventricular mural thrombus given large lesions of severe hypokinesis: per Cardiology recs pt started on coumadin no indication for TEE but outpatient Cardiology follow-up recommended.  It appears pt did not follow-up and has been noncompliant with coumadin  - Continuous telemetry monitoring  - Trend troponin's until peaked  - Prn levophed gtt to maintain map >65  - Echo pending  - Hold outpatient enalapril and lasix for now due to marginal bps  - Resume outpatient atorvastatin  - CTA Chest to r/o PE   Endo: - CBG's q4hrs  - Follow hypoglycemic/hyperglycemic protocol   Hypothermia  - Bare hugger to maintain normothermia  - R/o sepsis   Polysubstance abuse Hx: Depression and schizophrenia  - s/p psychiatric evaluation - appreciate input  Best Practice (right click and "Reselect all SmartList Selections" daily)   Diet/type: NPO; Will place dietitian consult for TF's  DVT prophylaxis: LMWH GI prophylaxis: H2B Lines: N/A Foley:  Yes, and it is still needed Code Status:  full code Last date of multidisciplinary goals of care discussion [N/A]  Labs   CBC: Recent Labs  Lab 09/10/22 0622 09/12/22 0526 09/13/22 0520  WBC 8.4 8.3 9.8  NEUTROABS 4.9 6.2  --   HGB 14.3 13.2 15.3  HCT 44.4 41.9 48.1  MCV 96.5 97.9 97.4  PLT 505* 373 400     Basic Metabolic Panel: Recent Labs  Lab 09/10/22 0622 09/10/22 0913 09/11/22 0342 09/12/22 0526 09/13/22 0520  NA 141  --  138 139 141  K 3.9  --  4.1 4.0 4.0  CL 103  --  103 104 105  CO2 27  --  26 27 25   GLUCOSE 123*  --  120* 109* 66*  BUN 14  --  21 24* 19  CREATININE 0.94  --  1.07 1.11 1.10  CALCIUM 8.4*  --  8.0* 8.2* 8.5*  MG  --  2.0 1.8 2.5* 2.6*  PHOS 4.0  --  3.8 5.0* 3.8    GFR: Estimated Creatinine  Clearance: 66.3 mL/min (by C-G formula based on SCr of 1.1 mg/dL). Recent Labs  Lab 09/10/22 0622 09/10/22 1310 09/10/22 1445 09/10/22 1708 09/12/22 0526 09/13/22 0520  PROCALCITON  --  <0.10  --   --   --   --   WBC 8.4  --   --   --  8.3 9.8  LATICACIDVEN  --   --  3.0* 1.9  --   --      Liver Function Tests: Recent Labs  Lab 09/10/22 0913 09/11/22 0342  AST  --  21  ALT  --  19  ALKPHOS  --  44  BILITOT  --  1.0  PROT  --  5.9*  ALBUMIN 3.8 3.0*    No results for input(s): "LIPASE", "AMYLASE" in the last 168 hours. No results for input(s): "AMMONIA" in the last 168 hours.  ABG    Component Value Date/Time   PHART 7.45 09/10/2022 1134   PCO2ART 46 09/10/2022 1134  PO2ART 121 (H) 09/10/2022 1134   HCO3 32.0 (H) 09/10/2022 1134   O2SAT 99.3 09/10/2022 1134     Coagulation Profile: No results for input(s): "INR", "PROTIME" in the last 168 hours.  Cardiac Enzymes: Recent Labs  Lab 09/10/22 1445  CKTOTAL 158     HbA1C: Hgb A1c MFr Bld  Date/Time Value Ref Range Status  07/14/2018 04:29 AM 5.8 (H) 4.8 - 5.6 % Final    Comment:    (NOTE) Pre diabetes:          5.7%-6.4% Diabetes:              >6.4% Glycemic control for   <7.0% adults with diabetes   07/13/2018 04:25 AM 6.2 (H) 4.8 - 5.6 % Final    Comment:    (NOTE) Pre diabetes:          5.7%-6.4% Diabetes:              >6.4% Glycemic control for   <7.0% adults with diabetes     CBG: Recent Labs  Lab 09/13/22 1212 09/13/22 1631 09/13/22 1943 09/13/22 2207 09/14/22 0344  GLUCAP 121* 98 96 81 80     Review of Systems:   Unable to assess pt mechanically intubated   Past Medical History:  He,  has a past medical history of Schizophrenia (HCC).   Surgical History:   Past Surgical History:  Procedure Laterality Date   APPENDECTOMY       Social History:   reports that he has never smoked. He has never used smokeless tobacco. He reports that he does not drink alcohol and does not  use drugs.   Family History:  His family history is not on file.   Allergies No Known Allergies   Home Medications  Prior to Admission medications   Medication Sig Start Date End Date Taking? Authorizing Provider  ARISTADA 882 MG/3.2ML prefilled syringe Inject 882 mg into the muscle every 30 (thirty) days. 08/23/22  Yes [provider]  ABILIFY MAINTENA 400 MG PRSY prefilled syringe Inject 400 mg into the muscle every 30 (thirty) days. 06/27/18   [provider]  atorvastatin (LIPITOR) 40 MG tablet Take 1 tablet (40 mg total) by mouth daily at 6 PM. 07/15/18   Shaune Pollack, MD  benzonatate [facility] PERLES) 100 MG capsule Take 1-2 tabs TID prn cough 10/09/21   Menshew, Charlesetta Ivory, PA-C  enalapril (VASOTEC) 2.5 MG tablet Take 1 tablet (2.5 mg total) by mouth daily. 07/15/18   Shaune Pollack, MD  furosemide (LASIX) 40 MG tablet Take 1 tablet (40 mg total) by mouth daily. 06/19/22 06/19/23  Jene Every, MD  warfarin (COUMADIN) 5 MG tablet Take 1 tablet (5 mg total) by mouth daily. 07/15/18 08/14/18  Shaune Pollack, MD       Vida Rigger, M.D.  Pulmonary & Critical Care Medicine

## 2022-09-14 NOTE — Progress Notes (Addendum)
Progress Note    Miguel [name]  [id number] DOB: 03-17-1951  DOA: 09/10/2022 PCP: [facility] [place]      Brief Narrative:    Medical records reviewed and are as summarized below:  Miguel [name] is a 72 y.o. male with past medical history significant for tobacco use disorder, cocaine use disorder, schizophrenia, who presented to the hospital with shortness of breath, wheezing and chest pain after smoking an unknown drug.  Reportedly, when EMS arrived, he was hypoxic with oxygen saturation of 88%.  He was given Solu-Medrol and DuoNebs and was brought to the emergency department.    ED course He complained of restlessness and chest tightness in the emergency department.  He was hypotensive with systolic BP in the range of 170-200. CXR concerning for mild cardiomegaly and suspected chronic pulmonary interstitial changes. Lab results revealed: BNP 713.4/troponin 37/COVID-19 negative/Influenza A&B negative/RSV negative. EKG showed sinus tachycardia, heart rate 118, no ischemic abnormality. Pt received bronchodilators and toradol. He later c/o abdominal/lower back pain and became tachypneic and hypertensive. He received 40 mg of iv lasix and 1 mg of iv ativan for anxiety. Post administration pt became hypoxic O2 sats 80-84% with decreased level of consciousness requiring NRB. Pt subsequently mechanically intubated by ED provider. PCCM team contacted for ICU admission.   He was admitted to the ICU for acute hypoxic respiratory failure secondary to flash pulmonary edema in the setting of hypertensive emergency following drug use.  Patient said that he had used cocaine prior to admission.  He was treated with IV Lasix.  He was also treated with empiric IV antibiotics.  He was hypotensive and required IV norepinephrine infusion for refractory hypotension. He required IV Precedex for sedation because of severe agitation.  He self extubated on 09/11/2022.  Subsequently, he was  transferred to the [facility] on 09/13/2022.     Assessment/Plan:   Principal Problem:   Acute on chronic systolic CHF (congestive heart failure) (HCC) Active Problems:   Acute respiratory failure with hypoxia (HCC)   Hypertensive emergency   Bilateral pleural effusion   Cocaine use disorder (HCC)   Schizoaffective disorder, bipolar type ([place])    Body mass index is 21.52 kg/m.    Acute on chronic systolic and diastolic CHF: Improved.  Continue metoprolol XL.  Start low-dose lisinopril. 2D echo on 09/11/2022 showed EF estimated at less than 123456, grade 2 diastolic dysfunction, severe RV dysfunction.  Previous 2D echo on 07/13/2018 showed EF estimated at 25 to 30%.   Bilateral pleural effusion on CT chest: Thoracentesis was ordered on 09/13/2022 however, ultrasound imaging done prior to thoracentesis showed only trace amount of fluid so thoracentesis was not performed.   Acute hypoxic respiratory failure: Self extubated on 09/11/2022.  He is tolerating room air. Initially treated with Zosyn for suspected pneumonia.  However, no evidence of pneumonia on CTA chest.  Discontinue Augmentin   Schizophrenia with recent agitation.  He is off of Precedex infusion.  Follow-up with psychiatrist.  Schizoaffective bipolar disorder, diagnosis of schizophrenia discharge prior to admission, recent agitation: Initially quired Precedex infusion for agitation.  He was evaluated by the psychiatrist today.  He has been started Depakote and Abilify with plan for follow-up by psychiatrist tomorrow.   Cocaine use disorder: Patient has been advised to avoid cocaine.  He said that he is ready to quit cocaine because of recent events.   Hypotension: Resolved.  Initially required Levophed drip.   Hypoglycemia: Resolved.  Encourage adequate oral  intake.     Diet Order             Diet Heart Room service appropriate? Yes; Fluid consistency: Thin  Diet effective now                             Consultants: Intensivist Psychiatrist  Procedures: Intubation and mechanical ventilation    Medications:    ARIPiprazole  5 mg Oral Daily   atorvastatin  40 mg Per Tube q1800   divalproex  500 mg Oral Q12H   enoxaparin (LOVENOX) injection  40 mg Subcutaneous Q24H   feeding supplement  237 mL Oral TID BM   lisinopril  10 mg Oral Daily   metoprolol succinate  25 mg Oral Daily   multivitamin with minerals  1 tablet Oral Daily   mouth rinse  15 mL Mouth Rinse 4 times per day   Continuous Infusions:  sodium chloride Stopped (09/11/22 1152)     Anti-infectives (From admission, onward)    Start     Dose/Rate Route Frequency Ordered Stop   09/12/22 1145  amoxicillin-clavulanate (AUGMENTIN) 875-125 MG per tablet 1 tablet  Status:  Discontinued        1 tablet Oral Every 12 hours 09/12/22 1045 09/14/22 1648   09/11/22 0915  piperacillin-tazobactam (ZOSYN) IVPB 3.375 g  Status:  Discontinued        3.375 g 12.5 mL/hr over 240 Minutes Intravenous Every 8 hours 09/11/22 0824 09/12/22 2108              Family Communication/Anticipated D/C date and plan/Code Status   DVT prophylaxis: enoxaparin (LOVENOX) injection 40 mg Start: 09/10/22 2200 Place and maintain sequential compression device Start: 09/10/22 1729 SCDs Start: 09/10/22 1058     Code Status: Full Code  Family Communication: None Disposition Plan: Plan to discharge home tomorrow   Status is: Inpatient Remains inpatient appropriate because: Started on Depakote and Abilify by psychiatrist with plan to follow-up tomorrow       Subjective:   Interval events noted.  He has no complaints.  No dizziness, chest pain or shortness of breath.  Objective:    Vitals:   09/14/22 0810 09/14/22 1200 09/14/22 1219 09/14/22 1611  BP: 131/78 126/68 128/84 (!) 139/98  Pulse: 67 76 82 86  Resp: 16 18 16 14   Temp: 97.9 F (36.6 C) 97.6 F (36.4 C) 97.8 F (36.6 C) (!) 97.5 F (36.4 C)   TempSrc: Oral  Oral Oral  SpO2: 100% 97% 97% (!) 62%  Weight:      Height:       No data found.   Intake/Output Summary (Last 24 hours) at 09/14/2022 1652 Last data filed at 09/14/2022 1416 Gross per 24 hour  Intake 1320 ml  Output 750 ml  Net 570 ml   Filed Weights   09/10/22 0624 09/10/22 1415 09/11/22 0400  Weight: 83.9 kg 67 kg 74 kg    Exam:  GEN: NAD SKIN: Warm and dry EYES: Anicteric ENT: MMM CV: RRR PULM: CTA B ABD: soft, ND, NT, +BS CNS: AAO x 3, non focal EXT: No edema or tenderness PSYCH: Calm and cooperative      Data Reviewed:   I have personally reviewed following labs and imaging studies:  Labs: Labs show the following:   Basic Metabolic Panel: Recent Labs  Lab 09/10/22 0622 09/10/22 0913 09/11/22 0342 09/12/22 0526 09/13/22 0520  NA 141  --  138 139 141  K 3.9  --  4.1 4.0 4.0  CL 103  --  103 104 105  CO2 27  --  26 27 25   GLUCOSE 123*  --  120* 109* 66*  BUN 14  --  21 24* 19  CREATININE 0.94  --  1.07 1.11 1.10  CALCIUM 8.4*  --  8.0* 8.2* 8.5*  MG  --  2.0 1.8 2.5* 2.6*  PHOS 4.0  --  3.8 5.0* 3.8   GFR Estimated Creatinine Clearance: 66.3 mL/min (by C-G formula based on SCr of 1.1 mg/dL). Liver Function Tests: Recent Labs  Lab 09/10/22 0913 09/11/22 0342  AST  --  21  ALT  --  19  ALKPHOS  --  44  BILITOT  --  1.0  PROT  --  5.9*  ALBUMIN 3.8 3.0*   No results for input(s): "LIPASE", "AMYLASE" in the last 168 hours. No results for input(s): "AMMONIA" in the last 168 hours. Coagulation profile No results for input(s): "INR", "PROTIME" in the last 168 hours.  CBC: Recent Labs  Lab 09/10/22 0622 09/12/22 0526 09/13/22 0520  WBC 8.4 8.3 9.8  NEUTROABS 4.9 6.2  --   HGB 14.3 13.2 15.3  HCT 44.4 41.9 48.1  MCV 96.5 97.9 97.4  PLT 505* 373 400   Cardiac Enzymes: Recent Labs  Lab 09/10/22 1445  CKTOTAL 158   BNP (last 3 results) No results for input(s): "PROBNP" in the last 8760 hours. CBG: Recent Labs   Lab 09/13/22 2207 09/14/22 0344 09/14/22 0812 09/14/22 1220 09/14/22 1612  GLUCAP 81 80 98 109* 92   D-Dimer: No results for input(s): "DDIMER" in the last 72 hours. Hgb A1c: No results for input(s): "HGBA1C" in the last 72 hours. Lipid Profile: No results for input(s): "CHOL", "HDL", "LDLCALC", "TRIG", "CHOLHDL", "LDLDIRECT" in the last 72 hours. Thyroid function studies: No results for input(s): "TSH", "T4TOTAL", "T3FREE", "THYROIDAB" in the last 72 hours.  Invalid input(s): "FREET3" Anemia work up: No results for input(s): "VITAMINB12", "FOLATE", "FERRITIN", "TIBC", "IRON", "RETICCTPCT" in the last 72 hours. Sepsis Labs: Recent Labs  Lab 09/10/22 0622 09/10/22 1310 09/10/22 1445 09/10/22 1708 09/12/22 0526 09/13/22 0520  PROCALCITON  --  <0.10  --   --   --   --   WBC 8.4  --   --   --  8.3 9.8  LATICACIDVEN  --   --  3.0* 1.9  --   --     Microbiology Recent Results (from the past 240 hour(s))  Resp panel by RT-PCR (RSV, Flu A&B, Covid) Anterior Nasal Swab     Status: None   Collection Time: 09/10/22  6:23 AM   Specimen: Anterior Nasal Swab  Result Value Ref Range Status   SARS Coronavirus 2 by RT PCR NEGATIVE NEGATIVE Final    Comment: (NOTE) SARS-CoV-2 target nucleic acids are NOT DETECTED.  The SARS-CoV-2 RNA is generally detectable in upper respiratory specimens during the acute phase of infection. The lowest concentration of SARS-CoV-2 viral copies this assay can detect is 138 copies/mL. A negative result does not preclude SARS-Cov-2 infection and should not be used as the sole basis for treatment or other patient management decisions. A negative result may occur with  improper specimen collection/handling, submission of specimen other than nasopharyngeal swab, presence of viral mutation(s) within the areas targeted by this assay, and inadequate number of viral copies(<138 copies/mL). A negative result must be combined with clinical observations,  patient history, and epidemiological information. The expected result  is Negative.  Fact Sheet for Patients:  EntrepreneurPulse.com.au  Fact Sheet for Healthcare Providers:  IncredibleEmployment.be  This test is no t yet approved or cleared by the [place] FDA and  has been authorized for detection and/or diagnosis of SARS-CoV-2 by FDA under an Emergency Use Authorization (EUA). This EUA will remain  in effect (meaning this test can be used) for the duration of the COVID-19 declaration under Section 564(b)(1) of the Act, 21 U.S.C.section 360bbb-3(b)(1), unless the authorization is terminated  or revoked sooner.       Influenza A by PCR NEGATIVE NEGATIVE Final   Influenza B by PCR NEGATIVE NEGATIVE Final    Comment: (NOTE) The Xpert Xpress SARS-CoV-2/FLU/RSV plus assay is intended as an aid in the diagnosis of influenza from Nasopharyngeal swab specimens and should not be used as a sole basis for treatment. Nasal washings and aspirates are unacceptable for Xpert Xpress SARS-CoV-2/FLU/RSV testing.  Fact Sheet for Patients: EntrepreneurPulse.com.au  Fact Sheet for Healthcare Providers: IncredibleEmployment.be  This test is not yet approved or cleared by the [place] FDA and has been authorized for detection and/or diagnosis of SARS-CoV-2 by FDA under an Emergency Use Authorization (EUA). This EUA will remain in effect (meaning this test can be used) for the duration of the COVID-19 declaration under Section 564(b)(1) of the Act, 21 U.S.C. section 360bbb-3(b)(1), unless the authorization is terminated or revoked.     Resp Syncytial Virus by PCR NEGATIVE NEGATIVE Final    Comment: (NOTE) Fact Sheet for Patients: EntrepreneurPulse.com.au  Fact Sheet for Healthcare Providers: IncredibleEmployment.be  This test is not yet approved or cleared by the [place] FDA and has been authorized for detection and/or diagnosis of SARS-CoV-2 by FDA under an Emergency Use Authorization (EUA). This EUA will remain in effect (meaning this test can be used) for the duration of the COVID-19 declaration under Section 564(b)(1) of the Act, 21 U.S.C. section 360bbb-3(b)(1), unless the authorization is terminated or revoked.  Performed at [facility], [address] [place], [place] [address]   MRSA Next Gen by PCR, Nasal     Status: None   Collection Time: 09/10/22  2:18 PM   Specimen: Nasal Mucosa; Nasal Swab  Result Value Ref Range Status   MRSA by PCR Next Gen NOT DETECTED NOT DETECTED Final    Comment: (NOTE) The GeneXpert MRSA Assay (FDA approved for NASAL specimens only), is one component of a comprehensive MRSA colonization surveillance program. It is not intended to diagnose MRSA infection nor to guide or monitor treatment for MRSA infections. Test performance is not FDA approved in patients less than 41 years old. Performed at [facility], [address] [place], [place] [address]   Culture, Respiratory w Gram Stain     Status: None   Collection Time: 09/10/22  4:43 PM   Specimen: Tracheal Aspirate; Respiratory  Result Value Ref Range Status   Specimen Description   Final    TRACHEAL ASPIRATE Performed at [facility], [address] [place], [place] [address]    Special Requests   Final    NONE Performed at [facility], [place], [place] [address]    Gram Stain   Final    RARE WBC PRESENT, PREDOMINANTLY PMN FEW GRAM POSITIVE COCCI FEW GRAM NEGATIVE RODS Performed at [facility], [place] [address], [place], [place] [address]    Culture ABUNDANT HAEMOPHILUS INFLUENZAE  Final   Report Status 09/13/2022 FINAL  Final  Procedures and diagnostic studies:  [place] CHEST (PLEURAL EFFUSION)  Result Date: 09/13/2022 CLINICAL DATA:  Pleural effusions, assess for  thoracentesis. EXAM: CHEST ULTRASOUND COMPARISON:  09/11/2022 FINDINGS: Ultrasound performed of the posterior chest. Small left effusion by ultrasound. No significant right effusion. Insufficient effusion to warrant therapeutic thoracentesis. Procedure not performed. IMPRESSION: Small left pleural effusion by [place]. Electronically Signed   By: Judie Petit.  Shick M.D.   On: 09/13/2022 12:31               LOS: 4 days   Greg Eckrich  [facility]   Pager on [email or web address]. If 7PM-7AM, please contact night-coverage at [email or web address]     09/14/2022, 4:52 PM

## 2022-09-14 NOTE — Consult Note (Signed)
Heart Failure Nurse Navigator Note  Met with patient today, he was lying quietly in bed in no acute distress.  Through educational materials explained to the patient function of the normal heart in relationship to his function of <20%.  Went on to explain that is why it is important to follow a low sodium diet of no more than 2000 mg a day, not using salt on his food and eating foods lower in sodium content. He again expresses he does not have a lot of money for buying the foods he would like to buy.  Discussed reading labels and doing the best that he could.  He states he likes soups as it is easy for him to fix. Talked about buying low sodium soups, etc.  Went over importance of fluid restriction of no more than 64 ounces or 2 liters.  He states that he can read.  He does not have a valid drivers license and either walks were he needs to go or uses a bus.  If his sister is not working she can give him a ride to where he needs to go.  He did not need to be redirected during the session  today as he had the day before.  He was given the living with heart failure teaching booklet, zone magnet, info on low sodium and heart failure along with weight chart.  He has no further questions.  He has follow up in the outpatient heart failure clinic on January 31 at 8:30 AM.  Pricilla Riffle RN [facility]

## 2022-09-14 NOTE — Consult Note (Signed)
Miguel [name]   Reason for [name]:  schizophrenia Referring Physician:  Dr Myriam Forehand Patient Identification: Miguel [name] MRN:  [id number] Principal Diagnosis: Acute on chronic systolic CHF (congestive heart failure) (HCC) Diagnosis:  Principal Problem:   Acute on chronic systolic CHF (congestive heart failure) (HCC) Active Problems:   Schizophrenia (HCC)   Acute respiratory failure with hypoxia (HCC)   Hypertensive emergency   Bilateral pleural effusion   Cocaine use disorder (HCC)   Total Time spent with patient: 45 minutes  Subjective: "I feel pretty good."  Miguel [name] is a 72 y.o. male patient admitted with CHF.Marland Kitchen Past medical history is significant for tobacco use disorder, cocaine use disorder, schizophrenia, who presented to the hospital with shortness of breath, wheezing and chest pain after smoking an unknown drug.  Reportedly, when EMS arrived, he was hypoxic with oxygen saturation of 88%.  He was given Solu-Medrol and DuoNebs and was brought to the emergency department. [name] was requested to verify psychiatric medication regiment.   HPI:  On evaluation, Miguel [name] was resting in bed watching TV. Initially he appeared drowsy and dysarthric, but alertness and speech improved through the interview. He described his mood as "semi-fine" and said he feels good about himself. Patient was grandiose at times, expressing that he has exceptional knowledge of legal matters, religion, and music performance. Patient denied suicidal ideations, hallucinations, anxiety, and depression. He reported using cocaine/crack two days ago and denied using other substances or alcohol. Caveat:  He has a diagnosis in his chart of schizophrenia with no psych admissions.  He appears more hypomanic with bipolar disorder than symptoms of schizophrenia.  His main treatment has been in prison where he resided for many years.    Patient reported he had his last Aristada  injection on January 1 of this year. He denied recent use of other psychotropic medications.  Medications started and will be re-evaluated tomorrow.    Past Psychiatric History: schizophrenia  Risk to Self:  none Risk to Others:  none Prior Inpatient Therapy:  none noted Prior Outpatient Therapy:  Good   Past Medical History:  Past Medical History:  Diagnosis Date   Schizophrenia [facility])     Past Surgical History:  Procedure Laterality Date   APPENDECTOMY     Family History: History reviewed. No pertinent family history. Family Psychiatric  History: schizophrenia Social History:  Social History   Substance and Sexual Activity  Alcohol Use No     Social History   Substance and Sexual Activity  Drug Use No    Social History   Socioeconomic History   Marital status: Divorced    Spouse name: Not on file   Number of children: Not on file   Years of education: Not on file   Highest education level: Not on file  Occupational History   Not on file  Tobacco Use   Smoking status: Never   Smokeless tobacco: Never  Vaping Use   Vaping Use: Never used  Substance and Sexual Activity   Alcohol use: No   Drug use: No   Sexual activity: Not on file  Other Topics Concern   Not on file  Social History Narrative   Not on file   Social Determinants of Health   Financial Resource Strain: Not on file  Food Insecurity: Not on file  Transportation Needs: Not on file  Physical Activity: Not on file  Stress: Not on file  Social Connections: Not on file   Additional Social  History:    Allergies:  No Known Allergies  Labs:  Results for orders placed or performed during the hospital encounter of 09/10/22 (from the past 48 hour(s))  Glucose, capillary     Status: None   Collection Time: 09/12/22 12:23 PM  Result Value Ref Range   Glucose-Capillary 92 70 - 99 mg/dL    Comment: Glucose reference range applies only to samples taken after fasting for at least 8 hours.  Glucose,  capillary     Status: Abnormal   Collection Time: 09/12/22  4:01 PM  Result Value Ref Range   Glucose-Capillary 62 (L) 70 - 99 mg/dL    Comment: Glucose reference range applies only to samples taken after fasting for at least 8 hours.  Glucose, capillary     Status: Abnormal   Collection Time: 09/12/22  6:57 PM  Result Value Ref Range   Glucose-Capillary 62 (L) 70 - 99 mg/dL    Comment: Glucose reference range applies only to samples taken after fasting for at least 8 hours.  Glucose, capillary     Status: None   Collection Time: 09/12/22  9:29 PM  Result Value Ref Range   Glucose-Capillary 78 70 - 99 mg/dL    Comment: Glucose reference range applies only to samples taken after fasting for at least 8 hours.  Glucose, capillary     Status: None   Collection Time: 09/12/22 11:12 PM  Result Value Ref Range   Glucose-Capillary 95 70 - 99 mg/dL    Comment: Glucose reference range applies only to samples taken after fasting for at least 8 hours.  Basic metabolic panel     Status: Abnormal   Collection Time: 09/13/22  5:20 AM  Result Value Ref Range   Sodium 141 135 - 145 mmol/L   Potassium 4.0 3.5 - 5.1 mmol/L   Chloride 105 98 - 111 mmol/L   CO2 25 22 - 32 mmol/L   Glucose, Bld 66 (L) 70 - 99 mg/dL    Comment: Glucose reference range applies only to samples taken after fasting for at least 8 hours.   BUN 19 8 - 23 mg/dL   Creatinine, Ser 1.10 0.61 - 1.24 mg/dL   Calcium 8.5 (L) 8.9 - 10.3 mg/dL   GFR, Estimated >60 >60 mL/min    Comment: (NOTE) Calculated using the CKD-EPI Creatinine Equation (2021)    Anion gap 11 5 - 15    Comment: Performed at [facility], [address] [place], [place] [address]  Magnesium     Status: Abnormal   Collection Time: 09/13/22  5:20 AM  Result Value Ref Range   Magnesium 2.6 (H) 1.7 - 2.4 mg/dL    Comment: Performed at [facility], [address] [place], [place] [address]  Phosphorus     Status: None   Collection Time:  09/13/22  5:20 AM  Result Value Ref Range   Phosphorus 3.8 2.5 - 4.6 mg/dL    Comment: Performed at [facility], [address] [place], [place] [address]  CBC     Status: Abnormal   Collection Time: 09/13/22  5:20 AM  Result Value Ref Range   WBC 9.8 4.0 - 10.5 K/uL   RBC 4.94 4.22 - 5.81 MIL/uL   Hemoglobin 15.3 13.0 - 17.0 g/dL   HCT 48.1 39.0 - 52.0 %   MCV 97.4 80.0 - 100.0 fL   MCH 31.0 26.0 - 34.0 pg   MCHC 31.8 30.0 - 36.0 g/dL  RDW 15.6 (H) 11.5 - 15.5 %   Platelets 400 150 - 400 K/uL   nRBC 0.0 0.0 - 0.2 %    Comment: Performed at [facility], [address] [place], [place] [address]  Glucose, capillary     Status: Abnormal   Collection Time: 09/13/22  7:57 AM  Result Value Ref Range   Glucose-Capillary 105 (H) 70 - 99 mg/dL    Comment: Glucose reference range applies only to samples taken after fasting for at least 8 hours.  Lactate dehydrogenase     Status: Abnormal   Collection Time: 09/13/22 11:46 AM  Result Value Ref Range   LDH 236 (H) 98 - 192 U/L    Comment: Performed at [facility], [address] [place], [place] [address]  Glucose, capillary     Status: Abnormal   Collection Time: 09/13/22 12:12 PM  Result Value Ref Range   Glucose-Capillary 121 (H) 70 - 99 mg/dL    Comment: Glucose reference range applies only to samples taken after fasting for at least 8 hours.  Glucose, capillary     Status: None   Collection Time: 09/13/22  4:31 PM  Result Value Ref Range   Glucose-Capillary 98 70 - 99 mg/dL    Comment: Glucose reference range applies only to samples taken after fasting for at least 8 hours.  Glucose, capillary     Status: None   Collection Time: 09/13/22  7:43 PM  Result Value Ref Range   Glucose-Capillary 96 70 - 99 mg/dL    Comment: Glucose reference range applies only to samples taken after fasting for at least 8 hours.  Glucose, capillary     Status: None   Collection Time: 09/13/22 10:07 PM  Result Value  Ref Range   Glucose-Capillary 81 70 - 99 mg/dL    Comment: Glucose reference range applies only to samples taken after fasting for at least 8 hours.  Glucose, capillary     Status: None   Collection Time: 09/14/22  3:44 AM  Result Value Ref Range   Glucose-Capillary 80 70 - 99 mg/dL    Comment: Glucose reference range applies only to samples taken after fasting for at least 8 hours.  Glucose, capillary     Status: None   Collection Time: 09/14/22  8:12 AM  Result Value Ref Range   Glucose-Capillary 98 70 - 99 mg/dL    Comment: Glucose reference range applies only to samples taken after fasting for at least 8 hours.    Current Facility-Administered Medications  Medication Dose Route Frequency Provider Last Rate Last Admin   0.9 %  sodium chloride infusion  250 mL Intravenous Continuous Darrick Penna, [facility]   Stopped at 09/11/22 1152   acetaminophen (TYLENOL) tablet 650 mg  650 mg Oral Q4H PRN Teressa Lower, NP       amoxicillin-clavulanate (AUGMENTIN) 875-125 MG per tablet 1 tablet  1 tablet Oral Q12H Ottie Glazier, MD   1 tablet at 09/14/22 0821   atorvastatin (LIPITOR) tablet 40 mg  40 mg Per Tube q1800 Teressa Lower, NP   40 mg at 09/13/22 1728   docusate sodium (COLACE) capsule 100 mg  100 mg Oral BID PRN Teressa Lower, NP       enoxaparin (LOVENOX) injection 40 mg  40 mg Subcutaneous Q24H Teressa Lower, NP   40 mg at 09/13/22 2118   feeding supplement (ENSURE ENLIVE / ENSURE PLUS) liquid 237 mL  237 mL Oral TID  BM Vida Rigger, MD   237 mL at 09/14/22 9476   metoprolol succinate (TOPROL-XL) 24 hr tablet 25 mg  25 mg Oral Daily Lurene Shadow, MD   25 mg at 09/14/22 5465   multivitamin with minerals tablet 1 tablet  1 tablet Oral Daily Vida Rigger, MD   1 tablet at 09/14/22 0354   Oral care mouth rinse  15 mL Mouth Rinse 4 times per day Rust-Chester, Cecelia Byars, NP   15 mL at 09/14/22 6568   Oral care mouth rinse  15 mL Mouth Rinse PRN Rust-Chester, Cecelia Byars, NP        polyethylene glycol (MIRALAX / GLYCOLAX) packet 17 g  17 g Oral Daily PRN Ezequiel Essex, NP        Musculoskeletal: Strength & Muscle Tone: within normal limits Gait & Station: normal Patient leans: N/A  Psychiatric Specialty Exam: Physical Exam Constitutional:      Appearance: He is well-developed.  HENT:     Head: Normocephalic.  Pulmonary:     Effort: Pulmonary effort is normal.  Musculoskeletal:     Cervical back: Normal range of motion.  Neurological:     General: No focal deficit present.     Mental Status: He is alert.  Psychiatric:        Attention and Perception: Attention and perception normal.        Mood and Affect: Mood normal.        Speech: Speech is tangential.        Behavior: Behavior normal. Behavior is cooperative.        Thought Content: Thought content is delusional.        Cognition and Memory: Cognition and memory normal.        Judgment: Judgment normal.     Review of Systems  Psychiatric/Behavioral:  Positive for substance abuse.   All other systems reviewed and are negative.   Blood pressure 128/84, pulse 82, temperature 97.8 F (36.6 C), temperature source Oral, resp. rate 16, height 6\' 1"  (1.854 m), weight 74 kg, SpO2 97 %.Body mass index is 21.52 kg/m.  General Appearance: Disheveled  Eye Contact:  Good  Speech:  Garbled and hyperverbal  Volume:  Normal  Mood:  Euthymic  Affect:  Congruent  Thought Process:  Descriptions of Associations: Tangential, delusions of grandeur at times  Orientation:  Full (Time, Place, and Person)  Thought Content:  Negative and Tangential  Suicidal Thoughts:  No  Homicidal Thoughts:  No  Memory:  Negative  Judgement:  Fair  Insight:  Fair  Psychomotor Activity:  Normal  Concentration:  Concentration: Fair and Attention Span: Fair  Recall:  of Knowledge:  Fair  Language:  Good  Akathisia:  Negative  Handed:  Right  AIMS (if indicated):     Assets:  Financial  Resources/Insurance Housing Resilience  ADL's:  Intact  Cognition:  WNL  Sleep:        Physical Exam: Physical Exam Constitutional:      Appearance: He is well-developed.  HENT:     Head: Normocephalic.  Pulmonary:     Effort: Pulmonary effort is normal.  Musculoskeletal:     Cervical back: Normal range of motion.  Neurological:     General: No focal deficit present.     Mental Status: He is alert.  Psychiatric:        Attention and Perception: Attention and perception normal.        Mood and Affect: Mood normal.  Speech: Speech is tangential.        Behavior: Behavior normal. Behavior is cooperative.        Thought Content: Thought content is delusional.        Cognition and Memory: Cognition and memory normal.        Judgment: Judgment normal.    Review of Systems  Psychiatric/Behavioral:  Positive for substance abuse.   All other systems reviewed and are negative.  Blood pressure 131/78, pulse 67, temperature 97.9 F (36.6 C), temperature source Oral, resp. rate 16, height 6\' 1"  (1.854 m), weight 74 kg, SpO2 100 %. Body mass index is 21.52 kg/m.  Treatment Plan Summary: Schizoaffective bipolar disorder: Started Depakote 500 mg BID Started Abilify 5 mg daily Follow up tomorrow  Disposition: Supportive therapy provided about ongoing stressors.  Client is not meeting criteria for inpatient psych admission.  , NP 09/14/2022 9:59 AM

## 2022-09-15 DIAGNOSIS — F250 Schizoaffective disorder, bipolar type: Secondary | ICD-10-CM | POA: Diagnosis not present

## 2022-09-15 DIAGNOSIS — I5023 Acute on chronic systolic (congestive) heart failure: Secondary | ICD-10-CM | POA: Diagnosis not present

## 2022-09-15 DIAGNOSIS — J9601 Acute respiratory failure with hypoxia: Secondary | ICD-10-CM | POA: Diagnosis not present

## 2022-09-15 LAB — GLUCOSE, CAPILLARY
Glucose-Capillary: 112 mg/dL — ABNORMAL HIGH (ref 70–99)
Glucose-Capillary: 115 mg/dL — ABNORMAL HIGH (ref 70–99)
Glucose-Capillary: 116 mg/dL — ABNORMAL HIGH (ref 70–99)
Glucose-Capillary: 126 mg/dL — ABNORMAL HIGH (ref 70–99)
Glucose-Capillary: 81 mg/dL (ref 70–99)
Glucose-Capillary: 97 mg/dL (ref 70–99)

## 2022-09-15 MED ORDER — ARIPIPRAZOLE 5 MG PO TABS: 5.0000 mg | ORAL_TABLET | Freq: Every day | ORAL | 0 refills | Status: DC

## 2022-09-15 MED ORDER — METOPROLOL SUCCINATE ER 25 MG PO TB24: 25.0000 mg | ORAL_TABLET | Freq: Every day | ORAL | 0 refills | Status: DC

## 2022-09-15 MED ORDER — LISINOPRIL 10 MG PO TABS: 10.0000 mg | ORAL_TABLET | Freq: Every day | ORAL | 0 refills | Status: DC

## 2022-09-15 MED ORDER — DIVALPROEX SODIUM 500 MG PO DR TAB: 500.0000 mg | DELAYED_RELEASE_TABLET | Freq: Two times a day (BID) | ORAL | 0 refills | Status: DC

## 2022-09-15 NOTE — Progress Notes (Signed)
Mobility Specialist - Progress Note    09/15/22 1200  Mobility  Activity Ambulated independently to bathroom;Stood at bedside  Level of Assistance Independent  Assistive Device None  Distance Ambulated (ft) 10 ft  Activity Response Tolerated well  Mobility Referral Yes  $Mobility charge 1 Mobility   Pt resting in bed on RA upon entry. Pt STS and ambulates to bathroom indep. Pt returned to bed and left with needs in reach.   Loma Sender Mobility Specialist 09/15/22, 12:24 PM

## 2022-09-15 NOTE — Progress Notes (Incomplete)
{  Select_[id number]}

## 2022-09-15 NOTE — Progress Notes (Signed)
Mobility Specialist - Progress Note     09/15/22 1300  Mobility  Activity Ambulated independently in hallway  Level of Assistance Independent  Assistive Device None  Distance Ambulated (ft) 520 ft  Activity Response Tolerated well  Mobility Referral Yes  $Mobility charge 1 Mobility   Pt resting in bed on RA upon entry. Pt STS and ambulates to hallway with no AD around NS for 3 laps. Pt returned to bed and left with needs in reach. RN notified of pt asking about discharge orders.   Loma Sender Mobility Specialist 09/15/22, 1:50 PM

## 2022-09-15 NOTE — Plan of Care (Signed)
  Problem: Education: °Goal: Knowledge of General Education information will improve °Description: Including pain rating scale, medication(s)/side effects and non-pharmacologic comfort measures °Outcome: Progressing °  °Problem: Health Behavior/Discharge Planning: °Goal: Ability to manage health-related needs will improve °Outcome: Progressing °  °Problem: Clinical Measurements: °Goal: Ability to maintain clinical measurements within normal limits will improve °Outcome: Progressing °Goal: Will remain free from infection °Outcome: Progressing °Goal: Diagnostic test results will improve °Outcome: Progressing °Goal: Respiratory complications will improve °Outcome: Progressing °Goal: Cardiovascular complication will be avoided °Outcome: Progressing °  °Problem: Activity: °Goal: Risk for activity intolerance will decrease °Outcome: Progressing °  °Problem: Nutrition: °Goal: Adequate nutrition will be maintained °Outcome: Progressing °  °Problem: Coping: °Goal: Level of anxiety will decrease °Outcome: Progressing °  °Problem: Elimination: °Goal: Will not experience complications related to bowel motility °Outcome: Progressing °Goal: Will not experience complications related to urinary retention °Outcome: Progressing °  °Problem: Pain Managment: °Goal: General experience of comfort will improve °Outcome: Progressing °  °Problem: Safety: °Goal: Ability to remain free from injury will improve °Outcome: Progressing °  °Problem: Skin Integrity: °Goal: Risk for impaired skin integrity will decrease °Outcome: Progressing °  °Problem: Safety: °Goal: Non-violent Restraint(s) °Outcome: Progressing °  °

## 2022-09-15 NOTE — Discharge Summary (Signed)
Physician Discharge Summary   Patient: Miguel [name] MRN: [id number] DOB: 17-Nov-1950  Admit date:     09/10/2022  Discharge date: 09/15/22  Discharge Physician: Jennye Boroughs   PCP: Mun[place]   Recommendations at discharge:      Follow-up with PCP in 1 week  Discharge Diagnoses: Principal Problem:   Acute on chronic systolic CHF (congestive heart failure) (HCC) Active Problems:   Acute respiratory failure with hypoxia (HCC)   Hypertensive emergency   Bilateral pleural effusion   Cocaine use disorder ([place])   Schizoaffective disorder, bipolar type ([place])  Resolved Problems:   Schizophrenia [facility])  Hospital Course:  [name] is a 72 y.o. male with past medical history significant for tobacco use disorder, cocaine use disorder, schizophrenia, who presented to the hospital with shortness of breath, wheezing and chest pain after smoking an unknown drug.  Reportedly, when EMS arrived, he was hypoxic with oxygen saturation of 88%.  He was given Solu-Medrol and DuoNebs and was brought to the emergency department.     ED course He complained of restlessness and chest tightness in the emergency department.  He was hypotensive with systolic BP in the range of 170-200. CXR concerning for mild cardiomegaly and suspected chronic pulmonary interstitial changes. Lab results revealed: BNP 713.4/troponin 37/COVID-19 negative/Influenza A&B negative/RSV negative. EKG showed sinus tachycardia, heart rate 118, no ischemic abnormality. Pt received bronchodilators and toradol. He later c/o abdominal/lower back pain and became tachypneic and hypertensive. He received 40 mg of iv lasix and 1 mg of iv ativan for anxiety. Post administration pt became hypoxic O2 sats 80-84% with decreased level of consciousness requiring NRB. Pt subsequently mechanically intubated by ED provider. PCCM team contacted for ICU admission.    He was admitted to the ICU for acute hypoxic respiratory  failure secondary to flash pulmonary edema in the setting of hypertensive emergency following drug use.  Patient said that he had used cocaine prior to admission.  He was treated with IV Lasix.  He was also treated with empiric IV antibiotics.  He was hypotensive and required IV norepinephrine infusion for refractory hypotension. He required IV Precedex for sedation because of severe agitation.  He self extubated on 09/11/2022.  Subsequently, [facility] was [facility] on 09/13/2022.      Assessment and Plan:  Acute on chronic systolic and diastolic CHF: Improved.  He will be discharged on metoprolol XL and lisinopril.  Resume Lasix at discharge.  Follow-up with cardiologist as an outpatient. 2D echo on 09/11/2022 showed EF estimated at less than [id number], grade 2 diastolic dysfunction, severe RV dysfunction.  Previous 2D echo on 07/13/2018 showed EF estimated at 25 to 30%.     Bilateral pleural effusion on CT chest: Thoracentesis was ordered on 09/13/2022 however, ultrasound imaging done prior to thoracentesis showed only trace amount of fluid so thoracentesis was not performed.     Acute hypoxic respiratory failure: Self extubated on 09/11/2022.  He is tolerating room air. Initially treated with Zosyn and Augmentin for suspected pneumonia.  Pneumonia has been ruled out.   Schizoaffective bipolar disorder, diagnosis of schizophrenia discharge prior to admission, recent agitation: Initially quired Precedex infusion for agitation.  He was evaluated by the psychiatrist who recommended Depakote and Abilify.  Outpatient follow-up with psychiatrist.     Cocaine use disorder: Patient has been advised to avoid cocaine.  He said that he is ready to quit cocaine because of recent events.     Hypotension:  Resolved.  Initially required Levophed drip.     Hypoglycemia: Resolved.  Encourage adequate oral intake.          Consultants: Intensivist, psychiatrist Procedures performed:  Intubation and mechanical ventilation Disposition: Home Diet recommendation:  Discharge Diet Orders (From admission, onward)     Start     Ordered   09/15/22 0000  Diet - low sodium heart healthy        09/15/22 1259           Cardiac diet DISCHARGE MEDICATION: Allergies as of 09/15/2022   No Known Allergies      Medication List     TAKE these medications    ARIPiprazole 5 MG tablet Commonly known as: ABILIFY Take 1 tablet (5 mg total) by mouth daily. Start taking on: September 16, 2022   [place] 882 MG/3.2ML prefilled syringe Generic drug: ARIPiprazole Lauroxil ER Inject 882 mg into the muscle every 30 (thirty) days.   atorvastatin 40 MG tablet Commonly known as: LIPITOR Take 1 tablet (40 mg total) by mouth daily at 6 PM.   divalproex 500 MG DR tablet Commonly known as: DEPAKOTE Take 1 tablet (500 mg total) by mouth every 12 (twelve) hours.   furosemide 40 MG tablet Commonly known as: Lasix Take 1 tablet (40 mg total) by mouth daily.   lisinopril 10 MG tablet Commonly known as: ZESTRIL Take 1 tablet (10 mg total) by mouth daily. Start taking on: September 16, 2022   metoprolol succinate 25 MG 24 hr tablet Commonly known as: TOPROL-XL Take 1 tablet (25 mg total) by mouth daily. Start taking on: September 16, 2022        Discharge Exam: Ceasar Mons Weights   09/10/22 1415 09/11/22 0400 09/15/22 0456  Weight: 67 kg 74 kg 80.6 kg   GEN: NAD SKIN: Warm and dry EYES: No pallor or icterus ENT: MMM CV: RRR PULM: No rales or wheezing ABD: soft, ND, NT, +BS CNS: AAO x 3, non focal EXT: No edema or tenderness   Condition at discharge: good  The results of significant diagnostics from this hospitalization (including imaging, microbiology, ancillary and laboratory) are listed below for reference.   Imaging Studies: [place] CHEST (PLEURAL EFFUSION)  Result Date: 09/13/2022 CLINICAL DATA:  Pleural effusions, assess for thoracentesis. EXAM: CHEST ULTRASOUND  COMPARISON:  09/11/2022 FINDINGS: Ultrasound performed of the posterior chest. Small left effusion by ultrasound. No significant right effusion. Insufficient effusion to warrant therapeutic thoracentesis. Procedure not performed. IMPRESSION: Small left pleural effusion by [place]. Electronically Signed   By: Judie Petit.  Shick M.D.   On: 09/13/2022 12:31   ECHOCARDIOGRAM COMPLETE  Result Date: 09/11/2022    ECHOCARDIOGRAM REPORT   Patient Name:   LEX LINHARES Date of Exam: 09/11/2022 Medical Rec #:  [id number]         Height:       73.0 in Accession #:    [id number]        Weight:       163.1 lb Date of Birth:  May 08, 1951          BSA:          1.973 m Patient Age:    71 years          BP:           106/63 mmHg Patient Gender: M                 HR:           61 bpm.  Exam Location:  [facility] Procedure: 2D Echo, Cardiac Doppler and Color Doppler Indications:     Acute respiratory distress R06.03  History:         Patient has prior history of Echocardiogram examinations, most                  recent 07/13/2018. Stroke.  Sonographer:     Sherrie Sport Referring Phys:  [id number] Teressa Lower Diagnosing Phys: Nelva Bush MD IMPRESSIONS  1. Left ventricular ejection fraction, by estimation, is <20%. The left ventricle has severely decreased function. The left ventricle demonstrates global hypokinesis. There is mild left ventricular hypertrophy. Left ventricular diastolic parameters are consistent with Grade II diastolic dysfunction (pseudonormalization). Elevated left atrial pressure.  2. Right ventricular systolic function is severely reduced. The right ventricular size is moderately enlarged.  3. Right atrial size was mildly dilated.  4. Moderate pleural effusion in the left lateral region.  5. The mitral valve is abnormal. Mild mitral valve regurgitation.  6. The aortic valve is tricuspid. There is moderate thickening of the aortic valve. Aortic valve regurgitation is not visualized. Aortic valve sclerosis is present, with no  evidence of aortic valve stenosis. FINDINGS  Left Ventricle: Left ventricular ejection fraction, by estimation, is <20%. The left ventricle has severely decreased function. The left ventricle demonstrates global hypokinesis. The left ventricular internal cavity size was normal in size. There is mild left ventricular hypertrophy. Left ventricular diastolic parameters are consistent with Grade II diastolic dysfunction (pseudonormalization). Elevated left atrial pressure. Right Ventricle: The right ventricular size is moderately enlarged. No increase in right ventricular wall thickness. Right ventricular systolic function is severely reduced. Left Atrium: Left atrial size was normal in size. Right Atrium: Right atrial size was mildly dilated. Pericardium: Trivial pericardial effusion is present. Mitral Valve: The mitral valve is abnormal. There is mild thickening of the mitral valve leaflet(s). Mild mitral valve regurgitation. Tricuspid Valve: The tricuspid valve is normal in structure. Tricuspid valve regurgitation is trivial. Aortic Valve: The aortic valve is tricuspid. There is moderate thickening of the aortic valve. Aortic valve regurgitation is not visualized. Aortic valve sclerosis is present, with no evidence of aortic valve stenosis. Aortic valve mean gradient measures  2.3 mmHg. Aortic valve peak gradient measures 4.5 mmHg. Aortic valve area, by VTI measures 1.49 cm. Pulmonic Valve: The pulmonic valve was normal in structure. Pulmonic valve regurgitation is trivial. No evidence of pulmonic stenosis. Aorta: The aortic root is normal in size and structure. Pulmonary Artery: The pulmonary artery is of normal size. Venous: The inferior vena cava was not well visualized. IAS/Shunts: The interatrial septum was not well visualized. Additional Comments: There is a moderate pleural effusion in the left lateral region.  LEFT VENTRICLE PLAX 2D LVIDd:         5.50 cm      Diastology LVIDs:         4.80 cm      LV e'  medial:    5.87 cm/s LV PW:         1.29 cm      LV E/e' medial:  15.1 LV IVS:        1.20 cm      LV e' lateral:   3.81 cm/s LVOT diam:     2.10 cm      LV E/e' lateral: 23.3 LV SV:         31 LV SV Index:   16 LVOT Area:     3.46 cm  LV Volumes (MOD) LV vol d, MOD A2C: 228.0 ml LV vol d, MOD A4C: 111.0 ml LV vol s, MOD A2C: 192.0 ml LV vol s, MOD A4C: 86.0 ml LV SV MOD A2C:     36.0 ml LV SV MOD A4C:     111.0 ml LV SV MOD BP:      26.3 ml RIGHT VENTRICLE RV Basal diam:  5.39 cm RV Mid diam:    7.70 cm RV S prime:     9.57 cm/s TAPSE (M-mode): 1.1 cm LEFT ATRIUM             Index        RIGHT ATRIUM           Index LA diam:        2.50 cm 1.27 cm/m   RA Area:     25.60 cm LA Vol (A2C):   40.8 ml 20.68 ml/m  RA Volume:   93.70 ml  47.49 ml/m LA Vol (A4C):   31.7 ml 16.07 ml/m LA Biplane Vol: 39.3 ml 19.92 ml/m  AORTIC VALVE AV Area (Vmax):    1.65 cm AV Area (Vmean):   1.55 cm AV Area (VTI):     1.49 cm AV Vmax:           105.77 cm/s AV Vmean:          72.833 cm/s AV VTI:            0.208 m AV Peak Grad:      4.5 mmHg AV Mean Grad:      2.3 mmHg LVOT Vmax:         50.30 cm/s LVOT Vmean:        32.600 cm/s LVOT VTI:          0.089 m LVOT/AV VTI ratio: 0.43  AORTA Ao Root diam: 3.20 cm MITRAL VALVE               TRICUSPID VALVE MV Area (PHT): 3.66 cm    TR Peak grad:   28.1 mmHg MV Decel Time: 207 msec    TR Vmax:        265.00 cm/s MV E velocity: 88.60 cm/s MV A velocity: 55.50 cm/s  SHUNTS MV E/A ratio:  1.60        Systemic VTI:  0.09 m                            Systemic Diam: 2.10 cm Nelva Bush MD Electronically signed by Nelva Bush MD Signature Date/Time: 09/11/2022/6:09:34 PM    Final    DG Chest Port 1 View  Result Date: 09/11/2022 CLINICAL DATA:  Acute respiratory failure. EXAM: PORTABLE CHEST 1 VIEW COMPARISON:  CT 09/10/2022.  Radiographs 09/10/2022 and 06/19/2022. FINDINGS: 0556 hours. Endotracheal tube is unchanged at the level of the mid trachea. A nasogastric tube has been  placed, projecting below the diaphragm. The tip is not visualized, although the side hole is near the GE junction. Bilateral pleural effusions and associated bibasilar pulmonary opacities have mildly increased compared with the most recent radiographs. Stable mild pulmonary edema. There is no pneumothorax. The bones appear unchanged. IMPRESSION: 1. Interval placement of a nasogastric tube. The tip is not visualized, although the side hole is near the GE junction. 2. Mildly increased bilateral pleural effusions and associated bibasilar pulmonary opacities. Stable mild pulmonary edema. Electronically Signed   By: Richardean Sale M.D.   On: 09/11/2022 08:19  DG Abd Portable 1V  Result Date: 09/10/2022 CLINICAL DATA:  Check gastric catheter placement EXAM: PORTABLE ABDOMEN - 1 VIEW COMPARISON:  None Available. FINDINGS: Scattered large and small bowel gas is noted. Gastric catheter is noted with the tip in the stomach. The proximal side port however lies in the distal esophagus. This should be advanced several cm deeper into the stomach. Left-sided effusion is noted similar to that seen on recent CT examination. IMPRESSION: Gastric catheter as described. This should be advanced several cm deeper into the stomach. Electronically Signed   By: Inez Catalina M.D.   On: 09/10/2022 19:36   CT Angio Chest PE W and/or Wo Contrast  Result Date: 09/10/2022 CLINICAL DATA:  Short of breath, wheezing, chest pain, hypoxia EXAM: CT ANGIOGRAPHY CHEST WITH CONTRAST TECHNIQUE: Multidetector CT imaging of the chest was performed using the standard protocol during bolus administration of intravenous contrast. Multiplanar CT image reconstructions and MIPs were obtained to evaluate the vascular anatomy. RADIATION DOSE REDUCTION: This exam was performed according to the departmental dose-optimization program which includes automated exposure control, adjustment of the mA and/or kV according to patient size and/or use of iterative  reconstruction technique. CONTRAST:  28mL OMNIPAQUE IOHEXOL 350 MG/ML SOLN COMPARISON:  09/10/2022 FINDINGS: Cardiovascular: This is a technically adequate evaluation of the pulmonary vasculature. No filling defects or pulmonary emboli. The heart is enlarged without pericardial effusion. Normal caliber of the thoracic aorta. Atherosclerosis of the aorta and coronary vasculature. Mediastinum/Nodes: Endotracheal 2 well above carina. Thyroid and esophagus unremarkable. No pathologic adenopathy. Lungs/Pleura: Bilateral pleural effusions, volume estimated 1 L each. Dependent atelectasis. There is diffuse increased interstitial prominence and central vascular congestion, consistent with interstitial edema. No pneumothorax. Upper Abdomen: No acute abnormality. Musculoskeletal: No acute or destructive bony lesions. Reconstructed images demonstrate no additional findings. Review of the MIP images confirms the above findings. IMPRESSION: 1. No evidence of pulmonary embolus. 2. Diffuse interlobular septal thickening, central vascular congestion, bilateral pleural effusions, consistent with pulmonary edema. 3. Cardiomegaly. 4. Aortic Atherosclerosis (ICD10-I70.0). Coronary artery atherosclerosis. Electronically Signed   By: Randa Ngo M.D.   On: 09/10/2022 16:42   DG Chest Port 1 View  Result Date: 09/10/2022 CLINICAL DATA:  Respiratory failure, endotracheal tube placement. EXAM: PORTABLE CHEST 1 VIEW COMPARISON:  09/10/2022 FINDINGS: Endotracheal tube tip is 5.6 cm above the carina. Similar appearance of central airway thickening noted. Heart size within normal limits for portable radiography. Interstitial accentuation is present bilaterally with some indistinctness of the pulmonary vasculature. Possibilities may include atypical pneumonia or noncardiogenic edema. No definite Kerley B lines noted. No blunting of the costophrenic angles. IMPRESSION: 1. Endotracheal tube tip is 5.6 cm above the carina. 2. Interstitial  accentuation bilaterally with some indistinctness of the pulmonary vasculature. Possibilities include atypical pneumonia or noncardiogenic edema. 3. Airway thickening is present suggesting bronchitis or reactive airways disease. Electronically Signed   By: Van Clines M.D.   On: 09/10/2022 10:38   DG Chest Portable 1 View  Result Date: 09/10/2022 CLINICAL DATA:  72 year old male with chest pain, shortness of breath, and cough after smoking unspecified drug. EXAM: PORTABLE CHEST 1 VIEW COMPARISON:  Chest radiographs 06/19/2022 and earlier. CT Abdomen and Pelvis 12/09/2021. FINDINGS: Portable AP upright view at 0651 hours. Stable lung volumes and mild cardiomegaly. Other mediastinal contours are within normal limits. Visualized tracheal air column is within normal limits. Coarse bilateral pulmonary interstitial markings appear to be chronic, similar to chest radiographs and CT lung bases last year. No pneumothorax, consolidation, definite pleural  effusion. No confluent lung opacity. No acute osseous abnormality identified. Negative visible bowel gas. IMPRESSION: Mild cardiomegaly and suspected chronic pulmonary interstitial changes. No definite acute cardiopulmonary abnormality. Electronically Signed   By: Genevie Ann M.D.   On: 09/10/2022 07:09    Microbiology: Results for orders placed or performed during the hospital encounter of 09/10/22  Resp panel by RT-PCR (RSV, Flu A&B, Covid) Anterior Nasal Swab     Status: None   Collection Time: 09/10/22  6:23 AM   Specimen: Anterior Nasal Swab  Result Value Ref Range Status   SARS Coronavirus 2 by RT PCR NEGATIVE NEGATIVE Final    Comment: (NOTE) SARS-CoV-2 target nucleic acids are NOT DETECTED.  The SARS-CoV-2 RNA is generally detectable in upper respiratory specimens during the acute phase of infection. The lowest concentration of SARS-CoV-2 viral copies this assay can detect is 138 copies/mL. A negative result does not preclude  SARS-Cov-2 infection and should not be used as the sole basis for treatment or other patient management decisions. A negative result may occur with  improper specimen collection/handling, submission of specimen other than nasopharyngeal swab, presence of viral mutation(s) within the areas targeted by this assay, and inadequate number of viral copies(<138 copies/mL). A negative result must be combined with clinical observations, patient history, and epidemiological information. The expected result is Negative.  Fact Sheet for Patients:  EntrepreneurPulse.com.au  Fact Sheet for Healthcare Providers:  IncredibleEmployment.be  This test is no t yet approved or cleared by the [place] FDA and  has been authorized for detection and/or diagnosis of SARS-CoV-2 by FDA under an Emergency Use Authorization (EUA). This EUA will remain  in effect (meaning this test can be used) for the duration of the COVID-19 declaration under Section 564(b)(1) of the Act, 21 U.S.C.section 360bbb-3(b)(1), unless the authorization is terminated  or revoked sooner.       Influenza A by PCR NEGATIVE NEGATIVE Final   Influenza B by PCR NEGATIVE NEGATIVE Final    Comment: (NOTE) The Xpert Xpress SARS-CoV-2/FLU/RSV plus assay is intended as an aid in the diagnosis of influenza from Nasopharyngeal swab specimens and should not be used as a sole basis for treatment. Nasal washings and aspirates are unacceptable for Xpert Xpress SARS-CoV-2/FLU/RSV testing.  Fact Sheet for Patients: EntrepreneurPulse.com.au  Fact Sheet for Healthcare Providers: IncredibleEmployment.be  This test is not yet approved or cleared by the [place] FDA and has been authorized for detection and/or diagnosis of SARS-CoV-2 by FDA under an Emergency Use Authorization (EUA). This EUA will remain in effect (meaning this test can be used) for the duration of  the COVID-19 declaration under Section 564(b)(1) of the Act, 21 U.S.C. section 360bbb-3(b)(1), unless the authorization is terminated or revoked.     Resp Syncytial Virus by PCR NEGATIVE NEGATIVE Final    Comment: (NOTE) Fact Sheet for Patients: EntrepreneurPulse.com.au  Fact Sheet for Healthcare Providers: IncredibleEmployment.be  This test is not yet approved or cleared by the [place] FDA and has been authorized for detection and/or diagnosis of SARS-CoV-2 by FDA under an Emergency Use Authorization (EUA). This EUA will remain in effect (meaning this test can be used) for the duration of the COVID-19 declaration under Section 564(b)(1) of the Act, 21 U.S.C. section 360bbb-3(b)(1), unless the authorization is terminated or revoked.  Performed at [facility], [address], [place], [place] [address]   MRSA Next Gen by PCR, Nasal     Status: None   Collection Time: 09/10/22  2:18 PM  Specimen: Nasal Mucosa; Nasal Swab  Result Value Ref Range Status   MRSA by PCR Next Gen NOT DETECTED NOT DETECTED Final    Comment: (NOTE) The GeneXpert MRSA Assay (FDA approved for NASAL specimens only), is one component of a comprehensive MRSA colonization surveillance program. It is not intended to diagnose MRSA infection nor to guide or monitor treatment for MRSA infections. Test performance is not FDA approved in patients less than 39 years old. Performed at [facility], [address] [place], [place] [address]   Culture, Respiratory w Gram Stain     Status: None   Collection Time: 09/10/22  4:43 PM   Specimen: Tracheal Aspirate; Respiratory  Result Value Ref Range Status   Specimen Description   Final    TRACHEAL ASPIRATE Performed at [facility], [address] [place], [place] [address]    Special Requests   Final    NONE Performed at [facility], [place], [place] [address]     Gram Stain   Final    RARE WBC PRESENT, PREDOMINANTLY PMN FEW GRAM POSITIVE COCCI FEW GRAM NEGATIVE RODS Performed at [facility], [place] [address], [place], [place] [address]    Culture ABUNDANT HAEMOPHILUS INFLUENZAE  Final   Report Status 09/13/2022 FINAL  Final    Labs: CBC: Recent Labs  Lab 09/10/22 0622 09/12/22 0526 09/13/22 0520  WBC 8.4 8.3 9.8  NEUTROABS 4.9 6.2  --   HGB 14.3 13.2 15.3  HCT 44.4 41.9 48.1  MCV 96.5 97.9 97.4  PLT 505* 373 [id number]   Basic Metabolic Panel: Recent Labs  Lab 09/10/22 0622 09/10/22 0913 09/11/22 0342 09/12/22 0526 09/13/22 0520  NA 141  --  138 139 141  K 3.9  --  4.1 4.0 4.0  CL 103  --  103 104 105  CO2 27  --  26 27 25   GLUCOSE 123*  --  120* 109* 66*  BUN 14  --  21 24* 19  CREATININE 0.94  --  1.07 1.11 1.10  CALCIUM 8.4*  --  8.0* 8.2* 8.5*  MG  --  2.0 1.8 2.5* 2.6*  PHOS 4.0  --  3.8 5.0* 3.8   Liver Function Tests: Recent Labs  Lab 09/10/22 0913 09/11/22 0342  AST  --  21  ALT  --  19  ALKPHOS  --  44  BILITOT  --  1.0  PROT  --  5.9*  ALBUMIN 3.8 3.0*   CBG: Recent Labs  Lab 09/15/22 0052 09/15/22 0334 09/15/22 0500 09/15/22 0836 09/15/22 1223  GLUCAP 126* 97 81 115* 112*    Discharge time spent: greater than 30 minutes.  Signed: Jennye Boroughs, MD [facility] 09/15/2022

## 2022-09-15 NOTE — Consult Note (Signed)
[facility] Face-to-Face Psychiatry Consult   Reason for Consult:  schizophrenia Referring Physician:  Dr Mal Misty Patient Identification: Miguel [name] MRN:  [id number] Principal Diagnosis: Acute on chronic systolic CHF (congestive heart failure) ([place]) Diagnosis:  Principal Problem:   Acute on chronic systolic CHF (congestive heart failure) ([place]) Active Problems:   Acute respiratory failure with hypoxia ([place])   Hypertensive emergency   Bilateral pleural effusion   Cocaine use disorder ([place])   Schizoaffective disorder, bipolar type ([place])   Total Time spent with patient: 45 minutes  Subjective: "I'm fine."  The client was seen yesterday and has some tangential thought processes and grandiose delusions.  Medications were started and he is calm, cooperative, logical with no tangential thought processes or delusions.  He has psychiatric providers in place for outpatient, "[facility]", and has a home to return.  Psych cleared at this time.   Miguel [name] is a 72 y.o. male patient admitted with CHF.Marland Kitchen Past medical history is significant for tobacco use disorder, cocaine use disorder, schizophrenia, who presented to the hospital with shortness of breath, wheezing and chest pain after smoking an unknown drug.  Reportedly, when EMS arrived, he was hypoxic with oxygen saturation of 88%.  He was given Solu-Medrol and DuoNebs and was brought to the emergency department. Psych consult was requested to verify psychiatric medication regiment.   HPI:  On evaluation, Miguel [name] was resting in bed watching TV. Initially he appeared drowsy and dysarthric, but alertness and speech improved through the interview. He described his mood as "semi-fine" and said he feels good about himself. Patient was grandiose at times, expressing that he has exceptional knowledge of legal matters, religion, and music performance. Patient denied suicidal ideations, hallucinations, anxiety, and depression. He reported using  cocaine/crack two days ago and denied using other substances or alcohol. Caveat:  He has a diagnosis in his chart of schizophrenia with no psych admissions.  He appears more hypomanic with bipolar disorder than symptoms of schizophrenia.  His main treatment has been in prison where he resided for many years.    Patient reported he had his last Aristada injection on January 1 of this year. He denied recent use of other psychotropic medications.  Medications started and will be re-evaluated tomorrow.    Past Psychiatric History: schizophrenia  Risk to Self:  none Risk to Others:  none Prior Inpatient Therapy:  none noted Prior Outpatient Therapy:  [facility]  Past Medical History:  Past Medical History:  Diagnosis Date   Schizophrenia [facility])     Past Surgical History:  Procedure Laterality Date   APPENDECTOMY     Family History: History reviewed. No pertinent family history. Family Psychiatric  History: schizophrenia Social History:  Social History   Substance and Sexual Activity  Alcohol Use No     Social History   Substance and Sexual Activity  Drug Use No    Social History   Socioeconomic History   Marital status: Divorced    Spouse name: Not on file   Number of children: Not on file   Years of education: Not on file   Highest education level: Not on file  Occupational History   Not on file  Tobacco Use   Smoking status: Never   Smokeless tobacco: Never  Vaping Use   Vaping Use: Never used  Substance and Sexual Activity   Alcohol use: No   Drug use: No   Sexual activity: Not on file  Other Topics Concern   Not on  file  Social History Narrative   Not on file   Social Determinants of Health   Financial Resource Strain: Not on file  Food Insecurity: Not on file  Transportation Needs: Not on file  Physical Activity: Not on file  Stress: Not on file  Social Connections: Not on file   Additional Social History:    Allergies:  No Known  Allergies  Labs:  Results for orders placed or performed during the hospital encounter of 09/10/22 (from the past 48 hour(s))  Glucose, capillary     Status: None   Collection Time: 09/13/22  4:31 PM  Result Value Ref Range   Glucose-Capillary 98 70 - 99 mg/dL    Comment: Glucose reference range applies only to samples taken after fasting for at least 8 hours.  Glucose, capillary     Status: None   Collection Time: 09/13/22  7:43 PM  Result Value Ref Range   Glucose-Capillary 96 70 - 99 mg/dL    Comment: Glucose reference range applies only to samples taken after fasting for at least 8 hours.  Glucose, capillary     Status: None   Collection Time: 09/13/22 10:07 PM  Result Value Ref Range   Glucose-Capillary 81 70 - 99 mg/dL    Comment: Glucose reference range applies only to samples taken after fasting for at least 8 hours.  Glucose, capillary     Status: None   Collection Time: 09/14/22  3:44 AM  Result Value Ref Range   Glucose-Capillary 80 70 - 99 mg/dL    Comment: Glucose reference range applies only to samples taken after fasting for at least 8 hours.  Glucose, capillary     Status: None   Collection Time: 09/14/22  8:12 AM  Result Value Ref Range   Glucose-Capillary 98 70 - 99 mg/dL    Comment: Glucose reference range applies only to samples taken after fasting for at least 8 hours.  Glucose, capillary     Status: Abnormal   Collection Time: 09/14/22 12:20 PM  Result Value Ref Range   Glucose-Capillary 109 (H) 70 - 99 mg/dL    Comment: Glucose reference range applies only to samples taken after fasting for at least 8 hours.  Glucose, capillary     Status: None   Collection Time: 09/14/22  4:12 PM  Result Value Ref Range   Glucose-Capillary 92 70 - 99 mg/dL    Comment: Glucose reference range applies only to samples taken after fasting for at least 8 hours.  Glucose, capillary     Status: Abnormal   Collection Time: 09/14/22  8:48 PM  Result Value Ref Range    Glucose-Capillary 100 (H) 70 - 99 mg/dL    Comment: Glucose reference range applies only to samples taken after fasting for at least 8 hours.  Glucose, capillary     Status: Abnormal   Collection Time: 09/15/22 12:08 AM  Result Value Ref Range   Glucose-Capillary 116 (H) 70 - 99 mg/dL    Comment: Glucose reference range applies only to samples taken after fasting for at least 8 hours.  Glucose, capillary     Status: Abnormal   Collection Time: 09/15/22 12:52 AM  Result Value Ref Range   Glucose-Capillary 126 (H) 70 - 99 mg/dL    Comment: Glucose reference range applies only to samples taken after fasting for at least 8 hours.  Glucose, capillary     Status: None   Collection Time: 09/15/22  3:34 AM  Result Value Ref Range  Glucose-Capillary 97 70 - 99 mg/dL    Comment: Glucose reference range applies only to samples taken after fasting for at least 8 hours.  Glucose, capillary     Status: None   Collection Time: 09/15/22  5:00 AM  Result Value Ref Range   Glucose-Capillary 81 70 - 99 mg/dL    Comment: Glucose reference range applies only to samples taken after fasting for at least 8 hours.  Glucose, capillary     Status: Abnormal   Collection Time: 09/15/22  8:36 AM  Result Value Ref Range   Glucose-Capillary 115 (H) 70 - 99 mg/dL    Comment: Glucose reference range applies only to samples taken after fasting for at least 8 hours.    Current Facility-Administered Medications  Medication Dose Route Frequency Provider Last Rate Last Admin   0.9 %  sodium chloride infusion  250 mL Intravenous Continuous Selinda Eon, [facility]   Stopped at 09/11/22 1152   acetaminophen (TYLENOL) tablet 650 mg  650 mg Oral Q4H PRN Ezequiel Essex, NP   650 mg at 09/14/22 2051   ARIPiprazole (ABILIFY) tablet 5 mg  5 mg Oral Daily Charm Rings, NP   5 mg at 09/15/22 1026   atorvastatin (LIPITOR) tablet 40 mg  40 mg Per Tube q1800 Ezequiel Essex, NP   40 mg at 09/14/22 1648   divalproex (DEPAKOTE) DR  tablet 500 mg  500 mg Oral Q12H Charm Rings, NP   500 mg at 09/15/22 1023   docusate sodium (COLACE) capsule 100 mg  100 mg Oral BID PRN Ezequiel Essex, NP       enoxaparin (LOVENOX) injection 40 mg  40 mg Subcutaneous Q24H Ezequiel Essex, NP   40 mg at 09/14/22 2102   feeding supplement (ENSURE ENLIVE / ENSURE PLUS) liquid 237 mL  237 mL Oral TID BM Vida Rigger, MD   237 mL at 09/15/22 1025   lisinopril (ZESTRIL) tablet 10 mg  10 mg Oral Daily Lurene Shadow, MD   10 mg at 09/15/22 1023   metoprolol succinate (TOPROL-XL) 24 hr tablet 25 mg  25 mg Oral Daily Lurene Shadow, MD   25 mg at 09/15/22 1023   multivitamin with minerals tablet 1 tablet  1 tablet Oral Daily Vida Rigger, MD   1 tablet at 09/15/22 1023   Oral care mouth rinse  15 mL Mouth Rinse 4 times per day Rust-Chester, Micheline Rough L, NP   15 mL at 09/15/22 1026   Oral care mouth rinse  15 mL Mouth Rinse PRN Rust-Chester, Micheline Rough L, NP       polyethylene glycol (MIRALAX / GLYCOLAX) packet 17 g  17 g Oral Daily PRN Ezequiel Essex, NP        Musculoskeletal: Strength & Muscle Tone: within normal limits Gait & Station: normal Patient leans: N/A  Psychiatric Specialty Exam: Physical Exam Constitutional:      Appearance: He is well-developed.  HENT:     Head: Normocephalic.  Pulmonary:     Effort: Pulmonary effort is normal.  Musculoskeletal:     Cervical back: Normal range of motion.  Neurological:     General: No focal deficit present.     Mental Status: He is alert.  Psychiatric:        Attention and Perception: Attention and perception normal.        Mood and Affect: Mood normal.        Speech: Speech normal.  Behavior: Behavior normal. Behavior is cooperative.        Thought Content: Thought content normal.        Cognition and Memory: Cognition and memory normal.        Judgment: Judgment normal.     Review of Systems  Psychiatric/Behavioral:  Positive for substance abuse.   All other systems  reviewed and are negative.   Blood pressure 124/82, pulse 95, temperature (!) 97.5 F (36.4 C), resp. rate 16, height 6\' 1"  (1.854 m), weight 80.6 kg, SpO2 99 %.Body mass index is 23.44 kg/m.  General Appearance: Casual  Eye Contact:  Good  Speech:  Garbled   Volume:  Normal  Mood:  Euthymic  Affect:  Congruent  Thought Process:  WDL  Orientation:  Full (Time, Place, and Person)  Thought Content:  WDL  Suicidal Thoughts:  No  Homicidal Thoughts:  No  Memory:  Fair  Judgement:  Fair  Insight:  Fair  Psychomotor Activity:  Normal  Concentration:  Concentration: Fair and Attention Span: Fair  Recall:  of Knowledge:  Fair  Language:  Good  Akathisia:  Negative  Handed:  Right  AIMS (if indicated):     Assets:  Financial Resources/Insurance Housing Resilience  ADL's:  Intact  Cognition:  WNL  Sleep:        Physical Exam: Physical Exam Constitutional:      Appearance: He is well-developed.  HENT:     Head: Normocephalic.  Pulmonary:     Effort: Pulmonary effort is normal.  Musculoskeletal:     Cervical back: Normal range of motion.  Neurological:     General: No focal deficit present.     Mental Status: He is alert.  Psychiatric:        Attention and Perception: Attention and perception normal.        Mood and Affect: Mood normal.        Speech: Speech normal.        Behavior: Behavior normal. Behavior is cooperative.        Thought Content: Thought content normal.        Cognition and Memory: Cognition and memory normal.        Judgment: Judgment normal.    Review of Systems  Psychiatric/Behavioral:  Positive for substance abuse.   All other systems reviewed and are negative.  Blood pressure 124/82, pulse 95, temperature (!) 97.5 F (36.4 C), resp. rate 16, height 6\' 1"  (1.854 m), weight 80.6 kg, SpO2 99 %. Body mass index is 23.44 kg/m.  Treatment Plan Summary: Schizoaffective bipolar disorder: Started Depakote 500 mg BID Started Abilify 5  mg daily Follow up with outpatient services  Disposition: Psych cleared  [employer], NP 09/15/2022 12:14 PM

## 2022-09-17 ENCOUNTER — Other Ambulatory Visit: Payer: Self-pay | Admitting: Internal Medicine

## 2022-09-17 MED ORDER — DIVALPROEX SODIUM 500 MG PO DR TAB: 500.0000 mg | DELAYED_RELEASE_TABLET | Freq: Two times a day (BID) | ORAL | 0 refills | Status: DC

## 2022-09-17 MED ORDER — METOPROLOL SUCCINATE ER 25 MG PO TB24: 25.0000 mg | ORAL_TABLET | Freq: Every day | ORAL | 0 refills | Status: DC

## 2022-09-17 MED ORDER — LISINOPRIL 10 MG PO TABS: 10.0000 mg | ORAL_TABLET | Freq: Every day | ORAL | 0 refills | Status: DC

## 2022-09-17 MED ORDER — ARIPIPRAZOLE 5 MG PO TABS: 5.0000 mg | ORAL_TABLET | Freq: Every day | ORAL | 0 refills | Status: DC

## 2022-09-18 ENCOUNTER — Telehealth: Payer: Self-pay

## 2022-09-25 IMAGING — CR DG CHEST 2V
2 series · 2 of 2 positions shown · non-contrast
Comparison: 10/09/2021

CLINICAL DATA: Chest pain

EXAM:
CHEST - 2 VIEW

[chest lat]
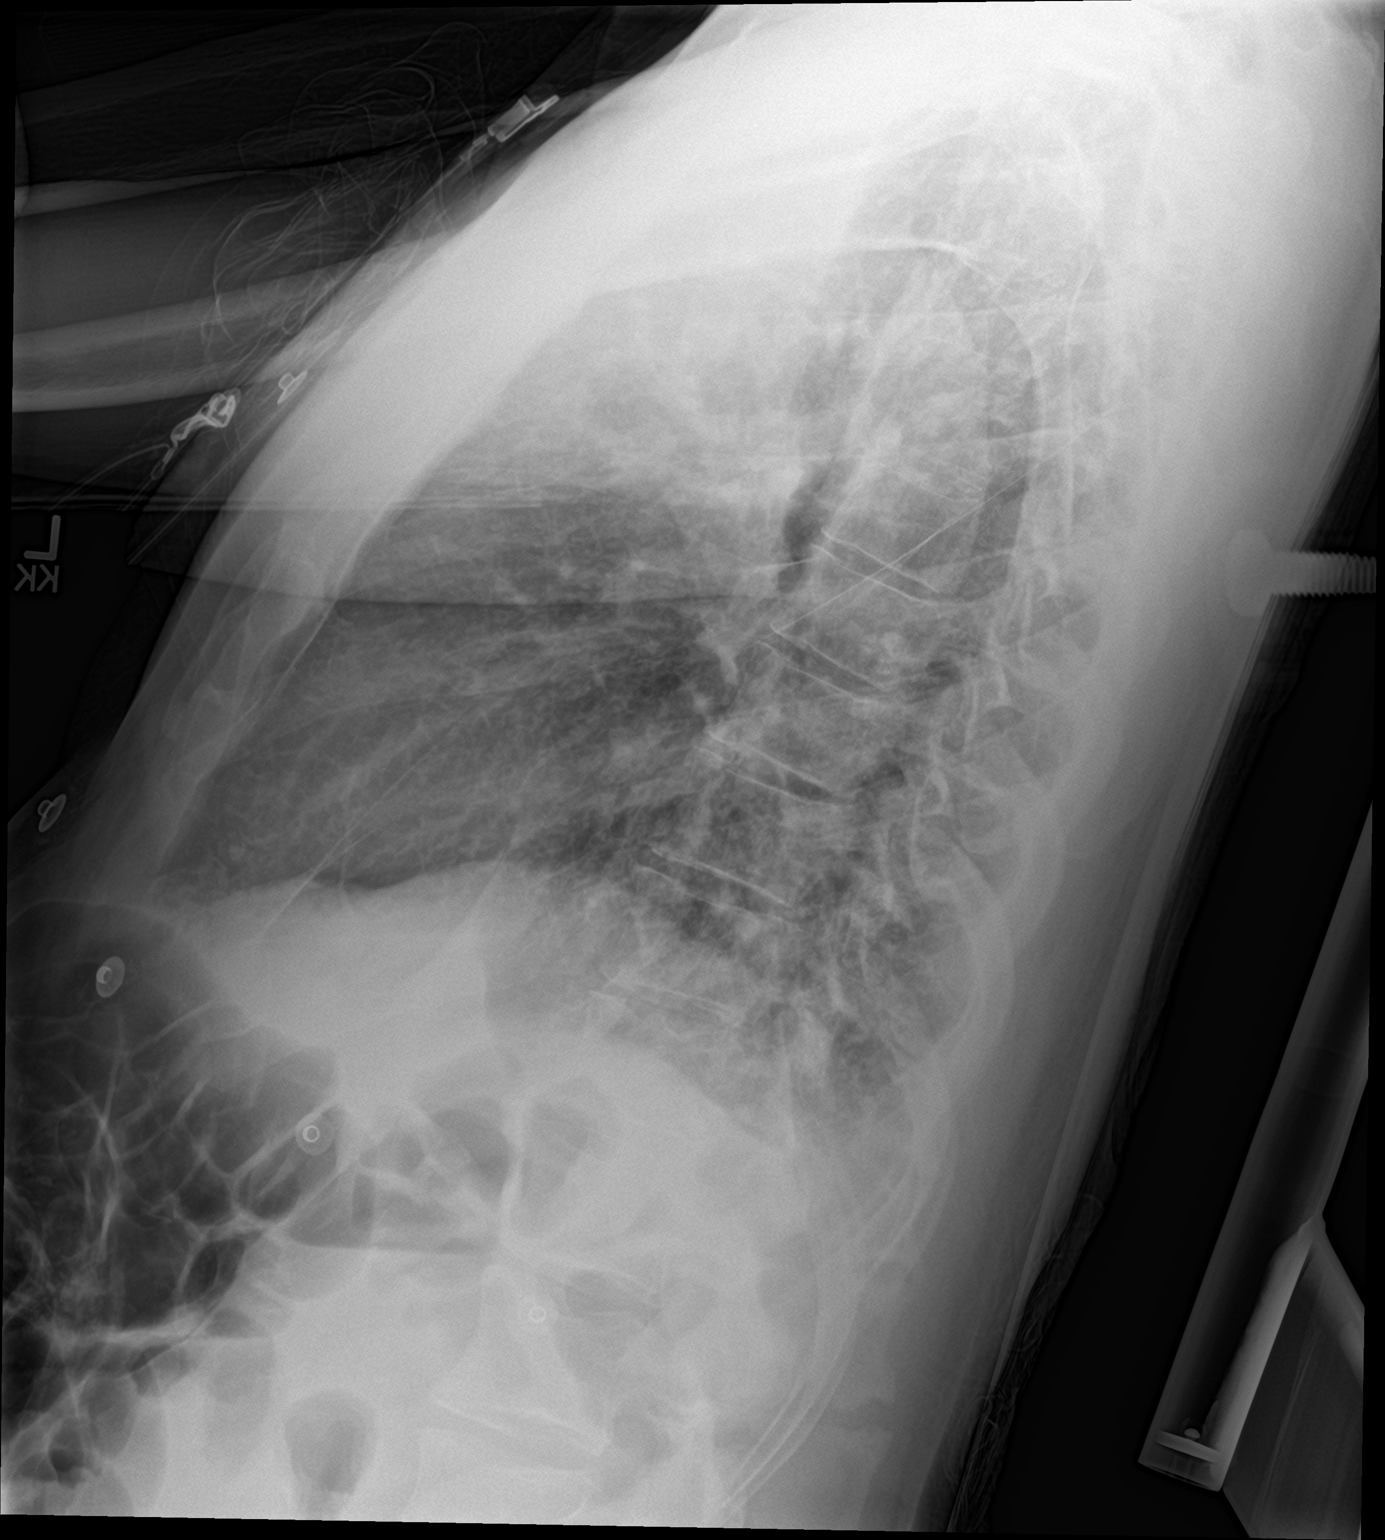

[chest ap]
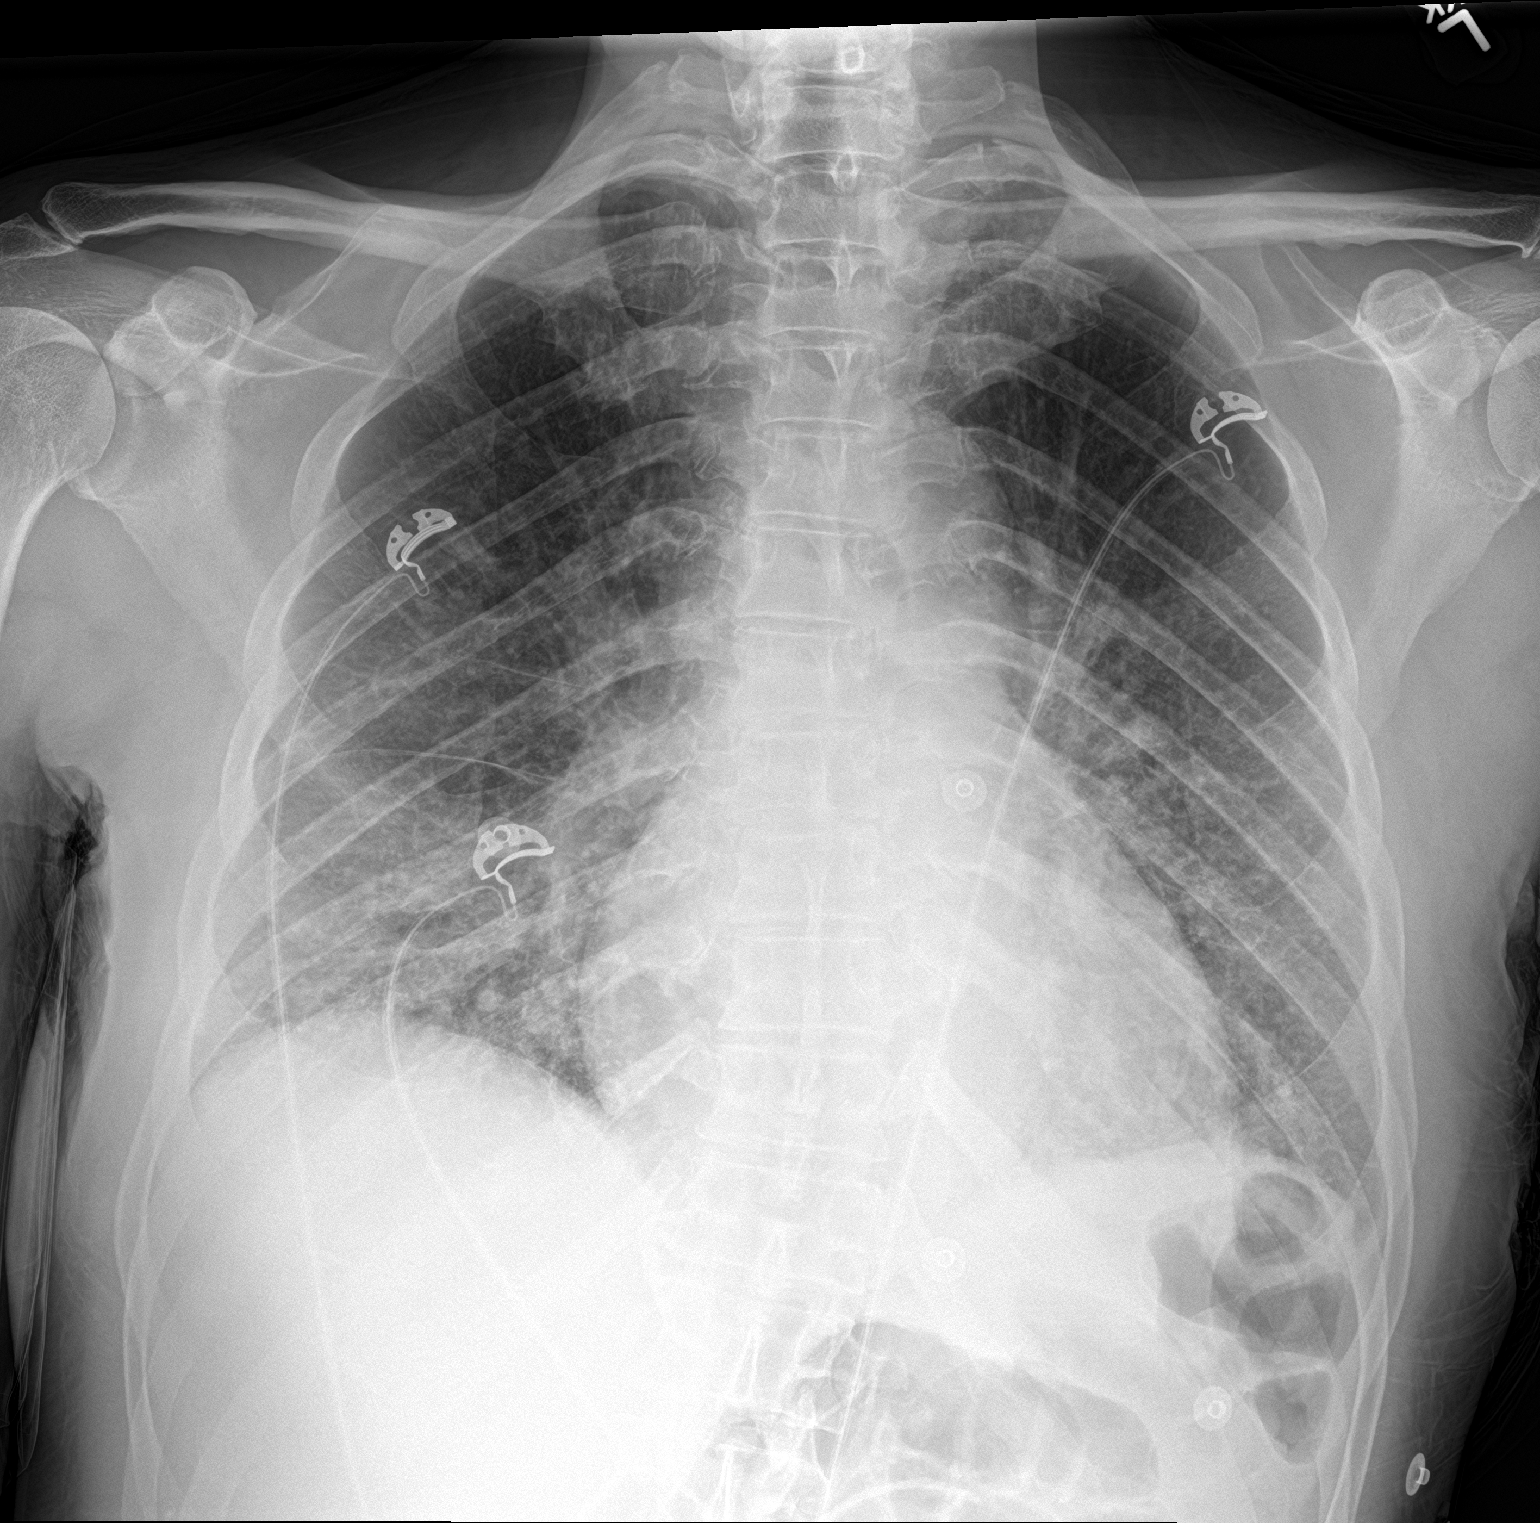

[2 of 2 positions shown; findings below may reference images not displayed]

FINDINGS: Transverse diameter of heart is increased. Central pulmonary vessels
are more prominent. Increased interstitial markings are seen in the
both parahilar regions and both lower lung fields. There is no focal
consolidation. There is minimal blunting of left lateral CP angle.
There is no pneumothorax.
IMPRESSION: Central pulmonary vessels are more prominent suggesting CHF.
Increased interstitial markings are seen in the parahilar regions
and lower lung fields on both sides suggesting pulmonary edema. Less
likely possibility would be interstitial pneumonia. Minimal left
pleural effusion.

## 2022-09-25 NOTE — Progress Notes (Deleted)
   Patient ID: Miguel [name], male    DOB: 1951/08/23, 72 y.o.   MRN: [id number]  HPI  Miguel [name] is a 72 y/o male with a history of  Echo 09/11/22 showed EF of < 20% along with mild LVH, moderate left pleural effusion, mild Miguel and severely reduced right ventricular systolic function.   Admitted 09/10/22 due to SOB, wheezing, chest pain after smoking an unknown substance. Became unresponsive and was intubated. Diuresed and given IV antibiotics. Was in the ED 06/19/22 due to HF.   He presents today for his initial visit with a chief complaint of   Review of Systems    Physical Exam    Assessment & Plan:  1: Chronic heart failure with reduced ejection fraction- - NYHA class - GDMT of - BNP 09/10/22 was 713.4  2: HTN- - BP - BMP 09/13/22 showed sodium 141, potassium 4.0, creatinine 1.1 & GFR >60    3: Schizophrenia-  4: Tobacco/ drug use-

## 2022-09-26 ENCOUNTER — Telehealth: Payer: Self-pay | Admitting: Family

## 2022-09-26 ENCOUNTER — Encounter: Payer: Medicare Other | Admitting: Family

## 2022-09-26 NOTE — Telephone Encounter (Signed)
Patient did not show for his initial [facility] appointment on 09/26/22. Will attempt to reschedule.

## 2022-12-10 ENCOUNTER — Telehealth: Payer: Self-pay

## 2022-12-10 NOTE — Telephone Encounter (Signed)
Attempted multiple times to call pt to schedule an appointment..unfortunately was unable to reach pt or leave a vm.

## 2023-04-27 ENCOUNTER — Other Ambulatory Visit: Payer: Self-pay

## 2023-04-27 ENCOUNTER — Emergency Department: Payer: Medicare Other

## 2023-04-27 ENCOUNTER — Emergency Department
Admission: EM | Admit: 2023-04-27 | Discharge: 2023-04-27 | Disposition: A | Discharge status: Home / Self Care | Payer: Medicare Other | Attending: Emergency Medicine | Admitting: Emergency Medicine

## 2023-04-27 DIAGNOSIS — R09A2 Foreign body sensation, throat: Secondary | ICD-10-CM | POA: Insufficient documentation

## 2023-04-27 DIAGNOSIS — R1310 Dysphagia, unspecified: Secondary | ICD-10-CM | POA: Diagnosis present

## 2023-04-27 MED ORDER — PANTOPRAZOLE SODIUM 40 MG PO TBEC
40.0000 mg | DELAYED_RELEASE_TABLET | Freq: Once | ORAL | Status: AC
  Administered 2023-04-27: 40 mg via ORAL
  Filled 2023-04-27: qty 1

## 2023-04-27 MED ORDER — ONDANSETRON 4 MG PO TBDP: 4.0000 mg | ORAL_TABLET | Freq: Four times a day (QID) | ORAL | 0 refills | Status: DC | PRN

## 2023-04-27 MED ORDER — LIDOCAINE VISCOUS HCL 2 % MT SOLN
15.0000 mL | Freq: Once | OROMUCOSAL | Status: AC
  Administered 2023-04-27: 15 mL via OROMUCOSAL
  Filled 2023-04-27: qty 15

## 2023-04-27 MED ORDER — ONDANSETRON HCL 4 MG/2ML IJ SOLN: 4.0000 mg | Freq: Once | INTRAMUSCULAR | Status: DC

## 2023-04-27 MED ORDER — PANTOPRAZOLE SODIUM 40 MG IV SOLR: 40.0000 mg | Freq: Once | INTRAVENOUS | Status: DC

## 2023-04-27 MED ORDER — NITROGLYCERIN 0.4 MG SL SUBL
0.4000 mg | SUBLINGUAL_TABLET | Freq: Once | SUBLINGUAL | Status: AC
  Administered 2023-04-27: 0.4 mg via SUBLINGUAL
  Filled 2023-04-27: qty 1

## 2023-04-27 MED ORDER — ONDANSETRON 4 MG PO TBDP
4.0000 mg | ORAL_TABLET | Freq: Once | ORAL | Status: AC
  Administered 2023-04-27: 4 mg via ORAL
  Filled 2023-04-27: qty 1

## 2023-04-27 MED ORDER — SODIUM CHLORIDE 0.9 % IV BOLUS (SEPSIS): 1000.0000 mL | Freq: Once | INTRAVENOUS | Status: DC

## 2023-04-27 MED ORDER — PANTOPRAZOLE SODIUM 40 MG PO TBEC: 40.0000 mg | DELAYED_RELEASE_TABLET | Freq: Every day | ORAL | 1 refills | Status: DC

## 2023-04-27 NOTE — ED Triage Notes (Signed)
Pt reports about 20 minutes ago he was eating meat and feels it is stuck in his throat. Pt reports he drank some water trying to get food down, but continues to have difficulty clearing his throat. Pt talks in complete sentences no respiratory distress noted.

## 2023-04-27 NOTE — Discharge Instructions (Addendum)
You were seen for the feeling of something being stuck in your throat.  Please follow-up with gastroenterology if symptoms continue.  I recommend a soft diet for the next 3 days.  Please avoid NSAIDs such as aspirin (Goody powders), ibuprofen (Motrin, Advil), naproxen (Aleve) as these may worsen your symptoms.  Tylenol 1000 mg every 6 hours is safe to take as long as you have no history of liver problems (heavy alcohol use, cirrhosis, hepatitis).  Please avoid spicy, acidic (citrus fruits, tomato based sauces, salsa), greasy, fatty foods.  Please avoid caffeine and alcohol.  Smoking can also make GERD/acid reflux worse.  Over the counter medications such as TUMS, Maalox or Mylanta, pepcid, Prilosec or Nexium may help with your symptoms.  Do not take Prilosec or Nexium if you are already prescribed a proton pump inhibitor.

## 2023-04-27 NOTE — ED Provider Notes (Signed)
[facility] Provider Note    Event Date/Time   First MD Initiated Contact with Patient 04/27/23 [phone]     (approximate)   History   Dysphagia   HPI  Miguel [name] is a 72 y.o. male with history of schizophrenia who presents to the emergency department feeling like something is stuck in his esophagus.  States he was eating either steak or pork chop around midnight tonight when he felt like he got stuck in his throat.  He is able to breathe normally, speak normally and is able to swallow his own secretions but has some discomfort with swallowing.  He does not think there is any bones in the food that he was eating.   History provided by patient.    Past Medical History:  Diagnosis Date   Schizophrenia [facility])     Past Surgical History:  Procedure Laterality Date   APPENDECTOMY      MEDICATIONS:  Prior to Admission medications   Medication Sig Start Date End Date Taking? Authorizing Provider  ARIPiprazole (ABILIFY) 5 MG tablet Take 1 tablet (5 mg total) by mouth daily. 09/17/22   Lurene Shadow, MD  ARISTADA 882 MG/3.2ML prefilled syringe Inject 882 mg into the muscle every 30 (thirty) days. 08/23/22   [provider]  atorvastatin (LIPITOR) 40 MG tablet Take 1 tablet (40 mg total) by mouth daily at 6 PM. 07/15/18   Shaune Pollack, MD  divalproex (DEPAKOTE) 500 MG DR tablet Take 1 tablet (500 mg total) by mouth every 12 (twelve) hours. 09/17/22   Lurene Shadow, MD  furosemide (LASIX) 40 MG tablet Take 1 tablet (40 mg total) by mouth daily. 06/19/22 06/19/23  Jene Every, MD  lisinopril (ZESTRIL) 10 MG tablet Take 1 tablet (10 mg total) by mouth daily. 09/17/22   Lurene Shadow, MD  metoprolol succinate (TOPROL-XL) 25 MG 24 hr tablet Take 1 tablet (25 mg total) by mouth daily. 09/17/22   Lurene Shadow, MD    Physical Exam   Triage Vital Signs: ED Triage Vitals  Encounter Vitals Group     BP 04/27/23 0134 (!) 159/103     Systolic BP  Percentile --      Diastolic BP Percentile --      Pulse Rate 04/27/23 0134 99     Resp 04/27/23 0134 20     Temp 04/27/23 0134 98 F (36.7 C)     Temp Source 04/27/23 0134 Oral     SpO2 04/27/23 0134 93 %     Weight 04/27/23 0136 185 lb (83.9 kg)     Height 04/27/23 0136 6' (1.829 m)     Head Circumference --      Peak Flow --      Pain Score 04/27/23 0136 10     Pain Loc --      Pain Education --      Exclude from Growth Chart --     Most recent vital signs: Vitals:   04/27/23 0134 04/27/23 0511  BP: (!) 159/103 (!) 166/105  Pulse: 99 84  Resp: 20 16  Temp: 98 F (36.7 C) 98.3 F (36.8 C)  SpO2: 93% 97%    CONSTITUTIONAL: Alert, responds appropriately to questions. Well-appearing; well-nourished HEAD: Normocephalic, atraumatic EYES: Conjunctivae clear, pupils appear equal, sclera nonicteric ENT: normal nose; moist mucous membranes, no trismus or drooling, no stridor, normal phonation, swallowing secretions without difficulty, no tonsillar hypertrophy or exudate, no uvular deviation NECK: Supple, normal ROM, no cervical lymphadenopathy, no thyromegaly,  trachea midline CARD: RRR; S1 and S2 appreciated RESP: Normal chest excursion without splinting or tachypnea; breath sounds clear and equal bilaterally; no wheezes, no rhonchi, no rales, no hypoxia or respiratory distress, speaking full sentences ABD/GI: Non-distended; soft, non-tender, no rebound, no guarding, no peritoneal signs BACK: The back appears normal EXT: Normal ROM in all joints; no deformity noted, no edema SKIN: Normal color for age and race; warm; no rash on exposed skin NEURO: Moves all extremities equally, normal speech PSYCH: The patient's mood and manner are appropriate.   ED Results / Procedures / Treatments   LABS: (all labs ordered are listed, but only abnormal results are displayed) Labs Reviewed - No data to display   EKG:     RADIOLOGY: My personal review and interpretation of imaging:  X-rays unremarkable and showed no foreign body.  I have personally reviewed all radiology reports.   DG Chest 2 View  Result Date: 04/27/2023 CLINICAL DATA:  Foreign body sensation EXAM: CHEST - 2 VIEW COMPARISON:  01/16/202 FINDINGS: The heart size and mediastinal contours are within normal limits. Both lungs are clear. The visualized skeletal structures are unremarkable. IMPRESSION: No active cardiopulmonary disease. Electronically Signed   By: Deatra Robinson M.D.   On: 04/27/2023 02:16   DG Neck Soft Tissue  Result Date: 04/27/2023 CLINICAL DATA:  Sensation of food stuck in the throat EXAM: NECK SOFT TISSUES - 1+ VIEW COMPARISON:  None Available. FINDINGS: There is no evidence of retropharyngeal soft tissue swelling or epiglottic enlargement. The cervical airway is unremarkable and no radio-opaque foreign body identified. There are bulky anterior osteophytes at C4 and C5 and at C7-T1. There is a potential that these could cause dysphagia. IMPRESSION: Bulky anterior osteophytes at C4 and C5 and at C7-T1. There is a potential that these could cause dysphagia. No radiopaque foreign body. Electronically Signed   By: Deatra Robinson M.D.   On: 04/27/2023 02:09     PROCEDURES:  Critical Care performed: No     Procedures    IMPRESSION / MDM / ASSESSMENT AND PLAN / ED COURSE  I reviewed the triage vital signs and the nursing notes.    Patient here with possible esophageal food bolus.     DIFFERENTIAL DIAGNOSIS (includes but not limited to):   Esophageal food bolus, esophagitis, GERD, doubt airway obstruction, doubt ACS   Patient's presentation is most consistent with acute complicated illness / injury requiring diagnostic workup.   PLAN: Patient here with possible esophageal food bolus.  He is able to manage his own secretions however.  Will give medications for symptomatic relief and p.o. challenge.  X-rays obtained from triage reviewed and interpreted by myself and the radiologist  and show no foreign body.   MEDICATIONS GIVEN IN ED: Medications  nitroGLYCERIN (NITROSTAT) SL tablet 0.4 mg (0.4 mg Sublingual Given 04/27/23 0414)  pantoprazole (PROTONIX) EC tablet 40 mg (40 mg Oral Given 04/27/23 0414)  lidocaine (XYLOCAINE) 2 % viscous mouth solution 15 mL (15 mLs Mouth/Throat Given 04/27/23 0414)  ondansetron (ZOFRAN-ODT) disintegrating tablet 4 mg (4 mg Oral Given 04/27/23 0414)     ED COURSE: Patient reports feeling much better after above medications.  Tolerating p.o.  Will discharge home.  Will discharge with Protonix, Zofran.  Recommended a soft diet for 3 days.  Will give GI follow-up information.   At this time, I do not feel there is any life-threatening condition present. I reviewed all nursing notes, vitals, pertinent previous records.  All lab and urine results, EKGs,  imaging ordered have been independently reviewed and interpreted by myself.  I reviewed all available radiology reports from any imaging ordered this visit.  Based on my assessment, I feel the patient is safe to be discharged home without further emergent workup and can continue workup as an outpatient as needed. Discussed all findings, treatment plan as well as usual and customary return precautions.  They verbalize understanding and are comfortable with this plan.  Outpatient follow-up has been provided as needed.  All questions have been answered.    CONSULTS:  none   OUTSIDE RECORDS REVIEWED: Reviewed previous cardiology note in 2016.       FINAL CLINICAL IMPRESSION(S) / ED DIAGNOSES   Final diagnoses:  Globus sensation     Rx / DC Orders   ED Discharge Orders          Ordered    pantoprazole (PROTONIX) 40 MG tablet  Daily,   Status:  Discontinued        04/27/23 0450    ondansetron (ZOFRAN-ODT) 4 MG disintegrating tablet  Every 6 hours PRN,   Status:  Discontinued        04/27/23 0450    ondansetron (ZOFRAN-ODT) 4 MG disintegrating tablet  Every 6 hours PRN        04/27/23  0514    pantoprazole (PROTONIX) 40 MG tablet  Daily        04/27/23 0514             Note:  This document was prepared using Dragon voice recognition software and may include unintentional dictation errors.   Janaysha Depaulo, Layla Maw, DO 04/27/23 [phone]

## 2023-07-31 ENCOUNTER — Ambulatory Visit: Payer: Medicare Other | Admitting: Pediatrics

## 2023-09-20 ENCOUNTER — Ambulatory Visit: Payer: Medicare Other | Admitting: Pediatrics

## 2023-09-20 NOTE — Patient Instructions (Incomplete)
Please schedule your yearly Medicare Annual Wellness Exam.

## 2024-01-02 DIAGNOSIS — Z1389 Encounter for screening for other disorder: Secondary | ICD-10-CM | POA: Diagnosis not present

## 2024-01-02 DIAGNOSIS — I5022 Chronic systolic (congestive) heart failure: Secondary | ICD-10-CM | POA: Diagnosis not present

## 2024-01-02 DIAGNOSIS — Z0000 Encounter for general adult medical examination without abnormal findings: Secondary | ICD-10-CM | POA: Diagnosis not present

## 2024-01-02 DIAGNOSIS — Z5902 Unsheltered homelessness: Secondary | ICD-10-CM | POA: Diagnosis not present

## 2024-01-02 DIAGNOSIS — R600 Localized edema: Secondary | ICD-10-CM | POA: Diagnosis not present

## 2024-01-02 DIAGNOSIS — Z1331 Encounter for screening for depression: Secondary | ICD-10-CM | POA: Diagnosis not present

## 2024-01-02 DIAGNOSIS — F209 Schizophrenia, unspecified: Secondary | ICD-10-CM | POA: Diagnosis not present

## 2024-01-02 DIAGNOSIS — Z8673 Personal history of transient ischemic attack (TIA), and cerebral infarction without residual deficits: Secondary | ICD-10-CM | POA: Diagnosis not present

## 2024-01-08 ENCOUNTER — Telehealth: Payer: Self-pay | Admitting: Family

## 2024-01-08 NOTE — Telephone Encounter (Signed)
 Tried to reach pt but voicemail box is full

## 2024-02-20 ENCOUNTER — Other Ambulatory Visit: Payer: Self-pay

## 2024-02-20 ENCOUNTER — Emergency Department

## 2024-02-20 ENCOUNTER — Inpatient Hospital Stay (HOSPITAL_COMMUNITY)
Admit: 2024-02-20 | Discharge: 2024-02-20 | Disposition: A | Discharge status: Home / Self Care | Attending: Student in an Organized Health Care Education/Training Program | Admitting: Student in an Organized Health Care Education/Training Program

## 2024-02-20 ENCOUNTER — Inpatient Hospital Stay

## 2024-02-20 ENCOUNTER — Inpatient Hospital Stay
Admission: EM | Admit: 2024-02-20 | Discharge: 2024-03-12 | DRG: 004 | Disposition: A | Discharge status: Other Acute Inpatient Hospital | Attending: Pulmonary Disease | Admitting: Pulmonary Disease

## 2024-02-20 DIAGNOSIS — E871 Hypo-osmolality and hyponatremia: Secondary | ICD-10-CM | POA: Diagnosis present

## 2024-02-20 DIAGNOSIS — J9602 Acute respiratory failure with hypercapnia: Secondary | ICD-10-CM | POA: Diagnosis present

## 2024-02-20 DIAGNOSIS — R0603 Acute respiratory distress: Secondary | ICD-10-CM | POA: Diagnosis not present

## 2024-02-20 DIAGNOSIS — Z9911 Dependence on respirator [ventilator] status: Secondary | ICD-10-CM

## 2024-02-20 DIAGNOSIS — F32A Depression, unspecified: Secondary | ICD-10-CM | POA: Diagnosis present

## 2024-02-20 DIAGNOSIS — G9389 Other specified disorders of brain: Secondary | ICD-10-CM | POA: Diagnosis present

## 2024-02-20 DIAGNOSIS — R008 Other abnormalities of heart beat: Secondary | ICD-10-CM | POA: Diagnosis not present

## 2024-02-20 DIAGNOSIS — Z01810 Encounter for preprocedural cardiovascular examination: Secondary | ICD-10-CM | POA: Diagnosis not present

## 2024-02-20 DIAGNOSIS — J440 Chronic obstructive pulmonary disease with acute lower respiratory infection: Secondary | ICD-10-CM | POA: Diagnosis present

## 2024-02-20 DIAGNOSIS — Z452 Encounter for adjustment and management of vascular access device: Secondary | ICD-10-CM | POA: Diagnosis not present

## 2024-02-20 DIAGNOSIS — I2510 Atherosclerotic heart disease of native coronary artery without angina pectoris: Secondary | ICD-10-CM | POA: Diagnosis present

## 2024-02-20 DIAGNOSIS — Z430 Encounter for attention to tracheostomy: Secondary | ICD-10-CM | POA: Diagnosis not present

## 2024-02-20 DIAGNOSIS — I472 Ventricular tachycardia, unspecified: Secondary | ICD-10-CM | POA: Diagnosis present

## 2024-02-20 DIAGNOSIS — E440 Moderate protein-calorie malnutrition: Secondary | ICD-10-CM | POA: Diagnosis not present

## 2024-02-20 DIAGNOSIS — I517 Cardiomegaly: Secondary | ICD-10-CM | POA: Diagnosis not present

## 2024-02-20 DIAGNOSIS — D539 Nutritional anemia, unspecified: Secondary | ICD-10-CM | POA: Diagnosis present

## 2024-02-20 DIAGNOSIS — R0602 Shortness of breath: Secondary | ICD-10-CM | POA: Diagnosis not present

## 2024-02-20 DIAGNOSIS — Z1152 Encounter for screening for COVID-19: Secondary | ICD-10-CM

## 2024-02-20 DIAGNOSIS — G9341 Metabolic encephalopathy: Secondary | ICD-10-CM | POA: Diagnosis present

## 2024-02-20 DIAGNOSIS — R7989 Other specified abnormal findings of blood chemistry: Secondary | ICD-10-CM | POA: Diagnosis not present

## 2024-02-20 DIAGNOSIS — R569 Unspecified convulsions: Secondary | ICD-10-CM | POA: Diagnosis not present

## 2024-02-20 DIAGNOSIS — J811 Chronic pulmonary edema: Secondary | ICD-10-CM | POA: Diagnosis not present

## 2024-02-20 DIAGNOSIS — I5020 Unspecified systolic (congestive) heart failure: Secondary | ICD-10-CM | POA: Diagnosis not present

## 2024-02-20 DIAGNOSIS — I469 Cardiac arrest, cause unspecified: Principal | ICD-10-CM | POA: Diagnosis present

## 2024-02-20 DIAGNOSIS — N170 Acute kidney failure with tubular necrosis: Secondary | ICD-10-CM | POA: Diagnosis present

## 2024-02-20 DIAGNOSIS — J9601 Acute respiratory failure with hypoxia: Secondary | ICD-10-CM | POA: Diagnosis present

## 2024-02-20 DIAGNOSIS — N179 Acute kidney failure, unspecified: Secondary | ICD-10-CM | POA: Diagnosis not present

## 2024-02-20 DIAGNOSIS — B963 Hemophilus influenzae [H. influenzae] as the cause of diseases classified elsewhere: Secondary | ICD-10-CM | POA: Diagnosis not present

## 2024-02-20 DIAGNOSIS — R570 Cardiogenic shock: Secondary | ICD-10-CM | POA: Diagnosis present

## 2024-02-20 DIAGNOSIS — L89152 Pressure ulcer of sacral region, stage 2: Secondary | ICD-10-CM | POA: Diagnosis not present

## 2024-02-20 DIAGNOSIS — E875 Hyperkalemia: Secondary | ICD-10-CM | POA: Diagnosis present

## 2024-02-20 DIAGNOSIS — A403 Sepsis due to Streptococcus pneumoniae: Secondary | ICD-10-CM | POA: Diagnosis present

## 2024-02-20 DIAGNOSIS — F250 Schizoaffective disorder, bipolar type: Secondary | ICD-10-CM | POA: Diagnosis present

## 2024-02-20 DIAGNOSIS — T17918A Gastric contents in respiratory tract, part unspecified causing other injury, initial encounter: Secondary | ICD-10-CM | POA: Diagnosis not present

## 2024-02-20 DIAGNOSIS — J90 Pleural effusion, not elsewhere classified: Secondary | ICD-10-CM

## 2024-02-20 DIAGNOSIS — I69320 Aphasia following cerebral infarction: Secondary | ICD-10-CM | POA: Diagnosis not present

## 2024-02-20 DIAGNOSIS — E43 Unspecified severe protein-calorie malnutrition: Secondary | ICD-10-CM | POA: Diagnosis present

## 2024-02-20 DIAGNOSIS — J13 Pneumonia due to Streptococcus pneumoniae: Secondary | ICD-10-CM | POA: Diagnosis present

## 2024-02-20 DIAGNOSIS — K7200 Acute and subacute hepatic failure without coma: Secondary | ICD-10-CM | POA: Diagnosis present

## 2024-02-20 DIAGNOSIS — I5023 Acute on chronic systolic (congestive) heart failure: Secondary | ICD-10-CM | POA: Diagnosis present

## 2024-02-20 DIAGNOSIS — I441 Atrioventricular block, second degree: Secondary | ICD-10-CM | POA: Diagnosis not present

## 2024-02-20 DIAGNOSIS — I513 Intracardiac thrombosis, not elsewhere classified: Secondary | ICD-10-CM | POA: Diagnosis not present

## 2024-02-20 DIAGNOSIS — R1310 Dysphagia, unspecified: Secondary | ICD-10-CM | POA: Diagnosis not present

## 2024-02-20 DIAGNOSIS — D62 Acute posthemorrhagic anemia: Secondary | ICD-10-CM | POA: Diagnosis not present

## 2024-02-20 DIAGNOSIS — I462 Cardiac arrest due to underlying cardiac condition: Secondary | ICD-10-CM | POA: Diagnosis present

## 2024-02-20 DIAGNOSIS — R739 Hyperglycemia, unspecified: Secondary | ICD-10-CM | POA: Diagnosis not present

## 2024-02-20 DIAGNOSIS — D65 Disseminated intravascular coagulation [defibrination syndrome]: Secondary | ICD-10-CM | POA: Diagnosis not present

## 2024-02-20 DIAGNOSIS — J9621 Acute and chronic respiratory failure with hypoxia: Secondary | ICD-10-CM | POA: Diagnosis not present

## 2024-02-20 DIAGNOSIS — E162 Hypoglycemia, unspecified: Secondary | ICD-10-CM | POA: Diagnosis present

## 2024-02-20 DIAGNOSIS — Z515 Encounter for palliative care: Secondary | ICD-10-CM

## 2024-02-20 DIAGNOSIS — F05 Delirium due to known physiological condition: Secondary | ICD-10-CM | POA: Diagnosis not present

## 2024-02-20 DIAGNOSIS — J432 Centrilobular emphysema: Secondary | ICD-10-CM | POA: Diagnosis not present

## 2024-02-20 DIAGNOSIS — R918 Other nonspecific abnormal finding of lung field: Secondary | ICD-10-CM | POA: Diagnosis not present

## 2024-02-20 DIAGNOSIS — S2243XA Multiple fractures of ribs, bilateral, initial encounter for closed fracture: Secondary | ICD-10-CM | POA: Diagnosis not present

## 2024-02-20 DIAGNOSIS — J9600 Acute respiratory failure, unspecified whether with hypoxia or hypercapnia: Secondary | ICD-10-CM

## 2024-02-20 DIAGNOSIS — D509 Iron deficiency anemia, unspecified: Secondary | ICD-10-CM

## 2024-02-20 DIAGNOSIS — I2489 Other forms of acute ischemic heart disease: Secondary | ICD-10-CM | POA: Diagnosis present

## 2024-02-20 DIAGNOSIS — I493 Ventricular premature depolarization: Secondary | ICD-10-CM | POA: Diagnosis not present

## 2024-02-20 DIAGNOSIS — I420 Dilated cardiomyopathy: Secondary | ICD-10-CM | POA: Diagnosis not present

## 2024-02-20 DIAGNOSIS — Z7401 Bed confinement status: Secondary | ICD-10-CM | POA: Diagnosis not present

## 2024-02-20 DIAGNOSIS — I4581 Long QT syndrome: Secondary | ICD-10-CM | POA: Diagnosis not present

## 2024-02-20 DIAGNOSIS — Z930 Tracheostomy status: Secondary | ICD-10-CM | POA: Diagnosis not present

## 2024-02-20 DIAGNOSIS — R188 Other ascites: Secondary | ICD-10-CM | POA: Diagnosis not present

## 2024-02-20 DIAGNOSIS — R9082 White matter disease, unspecified: Secondary | ICD-10-CM | POA: Diagnosis not present

## 2024-02-20 DIAGNOSIS — I4892 Unspecified atrial flutter: Secondary | ICD-10-CM | POA: Diagnosis not present

## 2024-02-20 DIAGNOSIS — G931 Anoxic brain damage, not elsewhere classified: Secondary | ICD-10-CM | POA: Diagnosis present

## 2024-02-20 DIAGNOSIS — F259 Schizoaffective disorder, unspecified: Secondary | ICD-10-CM | POA: Diagnosis present

## 2024-02-20 DIAGNOSIS — Z7189 Other specified counseling: Secondary | ICD-10-CM | POA: Diagnosis not present

## 2024-02-20 DIAGNOSIS — R001 Bradycardia, unspecified: Secondary | ICD-10-CM | POA: Diagnosis not present

## 2024-02-20 DIAGNOSIS — T17818A Gastric contents in other parts of respiratory tract causing other injury, initial encounter: Secondary | ICD-10-CM | POA: Diagnosis present

## 2024-02-20 DIAGNOSIS — K6389 Other specified diseases of intestine: Secondary | ICD-10-CM | POA: Diagnosis not present

## 2024-02-20 DIAGNOSIS — R404 Transient alteration of awareness: Secondary | ICD-10-CM | POA: Diagnosis not present

## 2024-02-20 DIAGNOSIS — R601 Generalized edema: Secondary | ICD-10-CM | POA: Diagnosis not present

## 2024-02-20 DIAGNOSIS — I959 Hypotension, unspecified: Secondary | ICD-10-CM | POA: Diagnosis not present

## 2024-02-20 DIAGNOSIS — Z87891 Personal history of nicotine dependence: Secondary | ICD-10-CM

## 2024-02-20 DIAGNOSIS — J9691 Respiratory failure, unspecified with hypoxia: Secondary | ICD-10-CM | POA: Diagnosis not present

## 2024-02-20 DIAGNOSIS — R7401 Elevation of levels of liver transaminase levels: Secondary | ICD-10-CM | POA: Diagnosis not present

## 2024-02-20 DIAGNOSIS — A413 Sepsis due to Hemophilus influenzae: Secondary | ICD-10-CM | POA: Diagnosis present

## 2024-02-20 DIAGNOSIS — R6521 Severe sepsis with septic shock: Secondary | ICD-10-CM | POA: Diagnosis present

## 2024-02-20 DIAGNOSIS — I639 Cerebral infarction, unspecified: Secondary | ICD-10-CM | POA: Diagnosis not present

## 2024-02-20 DIAGNOSIS — I700 Atherosclerosis of aorta: Secondary | ICD-10-CM | POA: Diagnosis not present

## 2024-02-20 DIAGNOSIS — D649 Anemia, unspecified: Secondary | ICD-10-CM | POA: Diagnosis not present

## 2024-02-20 DIAGNOSIS — I63512 Cerebral infarction due to unspecified occlusion or stenosis of left middle cerebral artery: Secondary | ICD-10-CM | POA: Diagnosis not present

## 2024-02-20 DIAGNOSIS — I5043 Acute on chronic combined systolic (congestive) and diastolic (congestive) heart failure: Secondary | ICD-10-CM | POA: Diagnosis not present

## 2024-02-20 DIAGNOSIS — R0902 Hypoxemia: Secondary | ICD-10-CM | POA: Diagnosis not present

## 2024-02-20 DIAGNOSIS — Z6826 Body mass index (BMI) 26.0-26.9, adult: Secondary | ICD-10-CM

## 2024-02-20 DIAGNOSIS — I63522 Cerebral infarction due to unspecified occlusion or stenosis of left anterior cerebral artery: Secondary | ICD-10-CM | POA: Diagnosis not present

## 2024-02-20 DIAGNOSIS — Z0189 Encounter for other specified special examinations: Secondary | ICD-10-CM | POA: Diagnosis not present

## 2024-02-20 DIAGNOSIS — R4182 Altered mental status, unspecified: Secondary | ICD-10-CM | POA: Diagnosis not present

## 2024-02-20 DIAGNOSIS — E861 Hypovolemia: Secondary | ICD-10-CM | POA: Diagnosis present

## 2024-02-20 DIAGNOSIS — Z4682 Encounter for fitting and adjustment of non-vascular catheter: Secondary | ICD-10-CM | POA: Diagnosis not present

## 2024-02-20 DIAGNOSIS — I5082 Biventricular heart failure: Secondary | ICD-10-CM | POA: Diagnosis present

## 2024-02-20 DIAGNOSIS — Z79899 Other long term (current) drug therapy: Secondary | ICD-10-CM

## 2024-02-20 DIAGNOSIS — E872 Acidosis, unspecified: Secondary | ICD-10-CM | POA: Diagnosis present

## 2024-02-20 DIAGNOSIS — J690 Pneumonitis due to inhalation of food and vomit: Secondary | ICD-10-CM | POA: Diagnosis present

## 2024-02-20 DIAGNOSIS — J189 Pneumonia, unspecified organism: Secondary | ICD-10-CM | POA: Diagnosis not present

## 2024-02-20 DIAGNOSIS — R140 Abdominal distension (gaseous): Secondary | ICD-10-CM | POA: Diagnosis not present

## 2024-02-20 DIAGNOSIS — I110 Hypertensive heart disease with heart failure: Secondary | ICD-10-CM | POA: Diagnosis not present

## 2024-02-20 DIAGNOSIS — J9690 Respiratory failure, unspecified, unspecified whether with hypoxia or hypercapnia: Secondary | ICD-10-CM | POA: Diagnosis not present

## 2024-02-20 HISTORY — DX: Other psychoactive substance use, unspecified, uncomplicated: F19.90

## 2024-02-20 HISTORY — DX: Cerebral infarction, unspecified: I63.9

## 2024-02-20 HISTORY — DX: Biventricular heart failure: I50.82

## 2024-02-20 LAB — BASIC METABOLIC PANEL WITH GFR
Anion gap: 16 — ABNORMAL HIGH (ref 5–15)
Anion gap: 14 (ref 5–15)
BUN: 52 mg/dL — ABNORMAL HIGH (ref 8–23)
BUN: 55 mg/dL — ABNORMAL HIGH (ref 8–23)
CO2: 25 mmol/L (ref 22–32)
CO2: 26 mmol/L (ref 22–32)
Calcium: 8 mg/dL — ABNORMAL LOW (ref 8.9–10.3)
Calcium: 7.9 mg/dL — ABNORMAL LOW (ref 8.9–10.3)
Chloride: 89 mmol/L — ABNORMAL LOW (ref 98–111)
Chloride: 89 mmol/L — ABNORMAL LOW (ref 98–111)
Creatinine, Ser: 2.21 mg/dL — ABNORMAL HIGH (ref 0.61–1.24)
Creatinine, Ser: 2.33 mg/dL — ABNORMAL HIGH (ref 0.61–1.24)
GFR, Estimated: 31 mL/min — ABNORMAL LOW (ref >60)
GFR, Estimated: 29 mL/min — ABNORMAL LOW (ref >60)
Glucose, Bld: 125 mg/dL — ABNORMAL HIGH (ref 70–99)
Glucose, Bld: 184 mg/dL — ABNORMAL HIGH (ref 70–99)
Potassium: 5.2 mmol/L — ABNORMAL HIGH (ref 3.5–5.1)
Potassium: 5 mmol/L (ref 3.5–5.1)
Sodium: 130 mmol/L — ABNORMAL LOW (ref 135–145)
Sodium: 129 mmol/L — ABNORMAL LOW (ref 135–145)

## 2024-02-20 LAB — BLOOD GAS, ARTERIAL
Acid-base deficit: 8.7 mmol/L — ABNORMAL HIGH (ref 0.0–2.0)
Acid-base deficit: 2.8 mmol/L — ABNORMAL HIGH (ref 0.0–2.0)
Acid-base deficit: 1.1 mmol/L (ref 0.0–2.0)
Bicarbonate: 23.8 mmol/L (ref 20.0–28.0)
Bicarbonate: 26 mmol/L (ref 20.0–28.0)
Bicarbonate: 24.8 mmol/L (ref 20.0–28.0)
FIO2: 100 %
FIO2: 80 %
FIO2: 50 %
MECHVT: 450 mL
MECHVT: 450 mL
MECHVT: 500 mL
Mechanical Rate: 15
Mechanical Rate: 15
Mechanical Rate: 20
O2 Saturation: 93.6 %
O2 Saturation: 93.4 %
O2 Saturation: 89.1 %
PEEP: 8 cmH2O
PEEP: 8 cmH2O
PEEP: 8 cmH2O
Patient temperature: 37
Patient temperature: 37
Patient temperature: 37
Spontaneous VT: 450 mL
pCO2 arterial: 88 mmHg (ref 32–48)
pCO2 arterial: 62 mmHg — ABNORMAL HIGH (ref 32–48)
pCO2 arterial: 45 mmHg (ref 32–48)
pH, Arterial: 7.04 — CL (ref 7.35–7.45)
pH, Arterial: 7.23 — ABNORMAL LOW (ref 7.35–7.45)
pH, Arterial: 7.35 (ref 7.35–7.45)
pO2, Arterial: 84 mmHg (ref 83–108)
pO2, Arterial: 70 mmHg — ABNORMAL LOW (ref 83–108)
pO2, Arterial: 59 mmHg — ABNORMAL LOW (ref 83–108)

## 2024-02-20 LAB — CBC WITH DIFFERENTIAL/PLATELET
Abs Immature Granulocytes: 0.64 10*3/uL — ABNORMAL HIGH (ref 0.00–0.07)
Abs Immature Granulocytes: 0.21 10*3/uL — ABNORMAL HIGH (ref 0.00–0.07)
Abs Immature Granulocytes: 0.16 10*3/uL — ABNORMAL HIGH (ref 0.00–0.07)
Basophils Absolute: 0.1 10*3/uL (ref 0.0–0.1)
Basophils Absolute: 0 10*3/uL (ref 0.0–0.1)
Basophils Absolute: 0 10*3/uL (ref 0.0–0.1)
Basophils Relative: 0 %
Basophils Relative: 0 %
Basophils Relative: 0 %
Eosinophils Absolute: 0 10*3/uL (ref 0.0–0.5)
Eosinophils Absolute: 0 10*3/uL (ref 0.0–0.5)
Eosinophils Absolute: 0.2 10*3/uL (ref 0.0–0.5)
Eosinophils Relative: 0 %
Eosinophils Relative: 0 %
Eosinophils Relative: 1 %
HCT: 37.3 % — ABNORMAL LOW (ref 39.0–52.0)
HCT: 39.7 % (ref 39.0–52.0)
HCT: 36.2 % — ABNORMAL LOW (ref 39.0–52.0)
Hemoglobin: 11.9 g/dL — ABNORMAL LOW (ref 13.0–17.0)
Hemoglobin: 12.8 g/dL — ABNORMAL LOW (ref 13.0–17.0)
Hemoglobin: 12.1 g/dL — ABNORMAL LOW (ref 13.0–17.0)
Immature Granulocytes: 4 %
Immature Granulocytes: 1 %
Immature Granulocytes: 1 %
Lymphocytes Relative: 4 %
Lymphocytes Relative: 3 %
Lymphocytes Relative: 3 %
Lymphs Abs: 0.6 10*3/uL — ABNORMAL LOW (ref 0.7–4.0)
Lymphs Abs: 0.5 10*3/uL — ABNORMAL LOW (ref 0.7–4.0)
Lymphs Abs: 0.5 10*3/uL — ABNORMAL LOW (ref 0.7–4.0)
MCH: 32.7 pg (ref 26.0–34.0)
MCH: 31.4 pg (ref 26.0–34.0)
MCH: 31.8 pg (ref 26.0–34.0)
MCHC: 31.9 g/dL (ref 30.0–36.0)
MCHC: 32.2 g/dL (ref 30.0–36.0)
MCHC: 33.4 g/dL (ref 30.0–36.0)
MCV: 102.5 fL — ABNORMAL HIGH (ref 80.0–100.0)
MCV: 97.5 fL (ref 80.0–100.0)
MCV: 95.3 fL (ref 80.0–100.0)
Monocytes Absolute: 0.6 10*3/uL (ref 0.1–1.0)
Monocytes Absolute: 1.1 10*3/uL — ABNORMAL HIGH (ref 0.1–1.0)
Monocytes Absolute: 0.9 10*3/uL (ref 0.1–1.0)
Monocytes Relative: 4 %
Monocytes Relative: 6 %
Monocytes Relative: 5 %
Neutro Abs: 12.7 10*3/uL — ABNORMAL HIGH (ref 1.7–7.7)
Neutro Abs: 16.5 10*3/uL — ABNORMAL HIGH (ref 1.7–7.7)
Neutro Abs: 15.9 10*3/uL — ABNORMAL HIGH (ref 1.7–7.7)
Neutrophils Relative %: 88 %
Neutrophils Relative %: 90 %
Neutrophils Relative %: 90 %
Platelets: 355 10*3/uL (ref 150–400)
Platelets: 351 10*3/uL (ref 150–400)
Platelets: 311 10*3/uL (ref 150–400)
RBC: 3.64 MIL/uL — ABNORMAL LOW (ref 4.22–5.81)
RBC: 4.07 MIL/uL — ABNORMAL LOW (ref 4.22–5.81)
RBC: 3.8 MIL/uL — ABNORMAL LOW (ref 4.22–5.81)
RDW: 14.2 % (ref 11.5–15.5)
RDW: 14.2 % (ref 11.5–15.5)
RDW: 13.7 % (ref 11.5–15.5)
WBC: 14.7 10*3/uL — ABNORMAL HIGH (ref 4.0–10.5)
WBC: 18 10*3/uL — ABNORMAL HIGH (ref 4.0–10.5)
WBC: 17.7 10*3/uL — ABNORMAL HIGH (ref 4.0–10.5)
nRBC: 1.6 % — ABNORMAL HIGH (ref 0.0–0.2)
nRBC: 0.9 % — ABNORMAL HIGH (ref 0.0–0.2)
nRBC: 0.7 % — ABNORMAL HIGH (ref 0.0–0.2)

## 2024-02-20 LAB — ECHOCARDIOGRAM COMPLETE
AR max vel: 1.93 cm2
AV Area VTI: 2.16 cm2
AV Area mean vel: 1.71 cm2
AV Mean grad: 1 mmHg
AV Peak grad: 1.8 mmHg
Ao pk vel: 0.68 m/s
Area-P 1/2: 3.85 cm2
Calc EF: 26 %
S' Lateral: 4.4 cm
Single Plane A2C EF: 10.1 %
Single Plane A4C EF: 37.6 %
Weight: 3648 [oz_av]

## 2024-02-20 LAB — COOXEMETRY PANEL
Carboxyhemoglobin: 1.2 % (ref 0.5–1.5)
Carboxyhemoglobin: 1.1 % (ref 0.5–1.5)
Methemoglobin: <0.7 % (ref 0.0–1.5)
Methemoglobin: <0.7 % (ref 0.0–1.5)
O2 Saturation: 62.4 %
O2 Saturation: 73.9 %
Total hemoglobin: 13.2 g/dL (ref 12.0–16.0)
Total hemoglobin: 13.2 g/dL (ref 12.0–16.0)
Total oxygen content: 61.6 %
Total oxygen content: 72.9 %

## 2024-02-20 LAB — URINE DRUG SCREEN, QUALITATIVE (ARMC ONLY)
Amphetamines, Ur Screen: NOT DETECTED
Barbiturates, Ur Screen: NOT DETECTED
Benzodiazepine, Ur Scrn: NOT DETECTED
Cannabinoid 50 Ng, Ur ~~LOC~~: NOT DETECTED
Cocaine Metabolite,Ur ~~LOC~~: NOT DETECTED
MDMA (Ecstasy)Ur Screen: NOT DETECTED
Methadone Scn, Ur: NOT DETECTED
Opiate, Ur Screen: NOT DETECTED
Phencyclidine (PCP) Ur S: NOT DETECTED
Tricyclic, Ur Screen: NOT DETECTED

## 2024-02-20 LAB — FOLATE: Folate: >40 ng/mL (ref >5.9)

## 2024-02-20 LAB — GLUCOSE, CAPILLARY
Glucose-Capillary: 124 mg/dL — ABNORMAL HIGH (ref 70–99)
Glucose-Capillary: 125 mg/dL — ABNORMAL HIGH (ref 70–99)
Glucose-Capillary: 165 mg/dL — ABNORMAL HIGH (ref 70–99)
Glucose-Capillary: 135 mg/dL — ABNORMAL HIGH (ref 70–99)
Glucose-Capillary: 104 mg/dL — ABNORMAL HIGH (ref 70–99)

## 2024-02-20 LAB — RESPIRATORY PANEL BY PCR

## 2024-02-20 LAB — URINALYSIS, W/ REFLEX TO CULTURE (INFECTION SUSPECTED)
Bacteria, UA: NONE SEEN
Bilirubin Urine: NEGATIVE
Glucose, UA: NEGATIVE mg/dL
Ketones, ur: NEGATIVE mg/dL
Nitrite: NEGATIVE
Protein, ur: 100 mg/dL — AB
Specific Gravity, Urine: 1.016 (ref 1.005–1.030)
Squamous Epithelial / HPF: 0 /HPF (ref 0–5)
pH: 5 (ref 5.0–8.0)

## 2024-02-20 LAB — LACTIC ACID, PLASMA
Lactic Acid, Venous: 8.1 mmol/L (ref 0.5–1.9)
Lactic Acid, Venous: 7.3 mmol/L (ref 0.5–1.9)
Lactic Acid, Venous: 5.7 mmol/L (ref 0.5–1.9)
Lactic Acid, Venous: 5.3 mmol/L (ref 0.5–1.9)
Lactic Acid, Venous: 5.3 mmol/L (ref 0.5–1.9)
Lactic Acid, Venous: 5.7 mmol/L (ref 0.5–1.9)

## 2024-02-20 LAB — TYPE AND SCREEN
ABO/RH(D): B POS
Antibody Screen: NEGATIVE

## 2024-02-20 LAB — FIBRINOGEN: Fibrinogen: 133 mg/dL — ABNORMAL LOW (ref 210–475)

## 2024-02-20 LAB — PHOSPHORUS
Phosphorus: 6.4 mg/dL — ABNORMAL HIGH (ref 2.5–4.6)
Phosphorus: 5.6 mg/dL — ABNORMAL HIGH (ref 2.5–4.6)

## 2024-02-20 LAB — PREPARE CRYOPRECIPITATE

## 2024-02-20 LAB — PREPARE FRESH FROZEN PLASMA

## 2024-02-20 LAB — COMPREHENSIVE METABOLIC PANEL WITH GFR
ALT: 2038 U/L — ABNORMAL HIGH (ref 0–44)
AST: 3301 U/L — ABNORMAL HIGH (ref 15–41)
Albumin: 2.7 g/dL — ABNORMAL LOW (ref 3.5–5.0)
Alkaline Phosphatase: 134 U/L — ABNORMAL HIGH (ref 38–126)
Anion gap: 14 (ref 5–15)
BUN: 49 mg/dL — ABNORMAL HIGH (ref 8–23)
CO2: 25 mmol/L (ref 22–32)
Calcium: 7.5 mg/dL — ABNORMAL LOW (ref 8.9–10.3)
Chloride: 90 mmol/L — ABNORMAL LOW (ref 98–111)
Creatinine, Ser: 2.22 mg/dL — ABNORMAL HIGH (ref 0.61–1.24)
GFR, Estimated: 31 mL/min — ABNORMAL LOW (ref >60)
Glucose, Bld: 110 mg/dL — ABNORMAL HIGH (ref 70–99)
Potassium: 5.3 mmol/L — ABNORMAL HIGH (ref 3.5–5.1)
Sodium: 129 mmol/L — ABNORMAL LOW (ref 135–145)
Total Bilirubin: 5.5 mg/dL — ABNORMAL HIGH (ref 0.0–1.2)
Total Protein: 5.7 g/dL — ABNORMAL LOW (ref 6.5–8.1)

## 2024-02-20 LAB — STREP PNEUMONIAE URINARY ANTIGEN: Strep Pneumo Urinary Antigen: NEGATIVE

## 2024-02-20 LAB — MAGNESIUM: Magnesium: 3.2 mg/dL — ABNORMAL HIGH (ref 1.7–2.4)

## 2024-02-20 LAB — ABO/RH: ABO/RH(D): B POS

## 2024-02-20 LAB — PROTIME-INR
INR: 3.4 — ABNORMAL HIGH (ref 0.8–1.2)
Prothrombin Time: 35.7 s — ABNORMAL HIGH (ref 11.4–15.2)

## 2024-02-20 LAB — CBG MONITORING, ED: Glucose-Capillary: 17 mg/dL — CL (ref 70–99)

## 2024-02-20 LAB — TROPONIN I (HIGH SENSITIVITY)
Troponin I (High Sensitivity): 344 ng/L (ref <18)
Troponin I (High Sensitivity): 528 ng/L (ref <18)
Troponin I (High Sensitivity): 939 ng/L (ref <18)
Troponin I (High Sensitivity): 2286 ng/L (ref <18)

## 2024-02-20 LAB — MRSA NEXT GEN BY PCR, NASAL: MRSA by PCR Next Gen: NOT DETECTED

## 2024-02-20 LAB — RESP PANEL BY RT-PCR (RSV, FLU A&B, COVID)  RVPGX2
Influenza A by PCR: NEGATIVE
Influenza B by PCR: NEGATIVE
Resp Syncytial Virus by PCR: NEGATIVE
SARS Coronavirus 2 by RT PCR: NEGATIVE

## 2024-02-20 LAB — HEPATITIS PANEL, ACUTE: Hep B C IgM: NONREACTIVE

## 2024-02-20 LAB — ETHANOL: Alcohol, Ethyl (B): <15 mg/dL (ref <15)

## 2024-02-20 LAB — APTT: aPTT: 41 s — ABNORMAL HIGH (ref 24–36)

## 2024-02-20 LAB — VITAMIN B12: Vitamin B-12: 7412 pg/mL — ABNORMAL HIGH (ref 180–914)

## 2024-02-20 LAB — BRAIN NATRIURETIC PEPTIDE: B Natriuretic Peptide: 819.5 pg/mL — ABNORMAL HIGH (ref 0.0–100.0)

## 2024-02-20 MED ORDER — EPINEPHRINE 1 MG/10ML IJ SOSY: 1.0000 mg | PREFILLED_SYRINGE | Freq: Once | INTRAMUSCULAR | Status: DC

## 2024-02-20 MED ORDER — DEXTROSE 50 % IV SOLN
1.0000 | Freq: Once | INTRAVENOUS | Status: AC
  Administered 2024-02-20: 50 mL via INTRAVENOUS
  Filled 2024-02-20: qty 50

## 2024-02-20 MED ORDER — SODIUM CHLORIDE 0.9% FLUSH
10.0000 mL | INTRAVENOUS | Status: DC | PRN
  Administered 2024-03-06: 10 mL

## 2024-02-20 MED ORDER — NOREPINEPHRINE 4 MG/250ML-% IV SOLN
0.0000 ug/min | INTRAVENOUS | Status: DC
  Administered 2024-02-20: 4 ug/min via INTRAVENOUS
  Administered 2024-02-20: 10 ug/min via INTRAVENOUS
  Filled 2024-02-20 (×2): qty 250

## 2024-02-20 MED ORDER — SODIUM CHLORIDE 0.9% IV SOLUTION: Freq: Once | INTRAVENOUS | Status: DC

## 2024-02-20 MED ORDER — VITAL HIGH PROTEIN PO LIQD: 1000.0000 mL | ORAL | Status: DC

## 2024-02-20 MED ORDER — VITAL 1.5 CAL PO LIQD: 1000.0000 mL | ORAL | Status: DC

## 2024-02-20 MED ORDER — FENTANYL CITRATE PF 50 MCG/ML IJ SOSY: 25.0000 ug | PREFILLED_SYRINGE | INTRAMUSCULAR | Status: DC | PRN

## 2024-02-20 MED ORDER — CALCIUM CHLORIDE 10 % IV SOLN: 1.0000 g | Freq: Once | INTRAVENOUS | Status: DC

## 2024-02-20 MED ORDER — VANCOMYCIN HCL 2000 MG/400ML IV SOLN
2000.0000 mg | Freq: Once | INTRAVENOUS | Status: AC
  Administered 2024-02-20: 2000 mg via INTRAVENOUS
  Filled 2024-02-20: qty 400

## 2024-02-20 MED ORDER — SODIUM CHLORIDE 0.9% IV SOLUTION: Freq: Once | INTRAVENOUS | Status: AC

## 2024-02-20 MED ORDER — HEPARIN BOLUS VIA INFUSION
6000.0000 [IU] | Freq: Once | INTRAVENOUS | Status: DC
  Filled 2024-02-20: qty 6000

## 2024-02-20 MED ORDER — FREE WATER
30.0000 mL | Status: DC
  Administered 2024-02-20 – 2024-02-25 (×29): 30 mL

## 2024-02-20 MED ORDER — HEPARIN SODIUM (PORCINE) 5000 UNIT/ML IJ SOLN: 5000.0000 [IU] | Freq: Three times a day (TID) | INTRAMUSCULAR | Status: DC

## 2024-02-20 MED ORDER — THIAMINE MONONITRATE 100 MG PO TABS: 100.0000 mg | ORAL_TABLET | Freq: Every day | ORAL | Status: DC

## 2024-02-20 MED ORDER — LACTATED RINGERS IV BOLUS
500.0000 mL | Freq: Once | INTRAVENOUS | Status: AC
  Administered 2024-02-20: 500 mL via INTRAVENOUS

## 2024-02-20 MED ORDER — PROPOFOL 1000 MG/100ML IV EMUL
INTRAVENOUS | Status: AC
  Administered 2024-02-20: 5 ug/kg/min via INTRAVENOUS
  Filled 2024-02-20: qty 100

## 2024-02-20 MED ORDER — ORAL CARE MOUTH RINSE
15.0000 mL | OROMUCOSAL | Status: DC | PRN
Start: 2024-02-20 — End: 2024-02-25

## 2024-02-20 MED ORDER — ADULT MULTIVITAMIN W/MINERALS CH
1.0000 | ORAL_TABLET | Freq: Every day | ORAL | Status: DC
  Administered 2024-02-21 – 2024-03-12 (×19): 1
  Filled 2024-02-20 (×20): qty 1

## 2024-02-20 MED ORDER — PROPOFOL 1000 MG/100ML IV EMUL
0.0000 ug/kg/min | INTRAVENOUS | Status: DC
  Administered 2024-02-20: 15 ug/kg/min via INTRAVENOUS
  Administered 2024-02-21: 5 ug/kg/min via INTRAVENOUS
  Filled 2024-02-20 (×2): qty 100

## 2024-02-20 MED ORDER — MAGNESIUM SULFATE 50 % IJ SOLN
2.0000 g | Freq: Once | INTRAMUSCULAR | Status: DC
  Filled 2024-02-20: qty 4

## 2024-02-20 MED ORDER — DOCUSATE SODIUM 100 MG PO CAPS: 100.0000 mg | ORAL_CAPSULE | Freq: Two times a day (BID) | ORAL | Status: DC | PRN

## 2024-02-20 MED ORDER — ORAL CARE MOUTH RINSE
15.0000 mL | OROMUCOSAL | Status: DC
  Administered 2024-02-20 – 2024-02-25 (×58): 15 mL via OROMUCOSAL

## 2024-02-20 MED ORDER — VANCOMYCIN HCL IN DEXTROSE 1-5 GM/200ML-% IV SOLN: 1000.0000 mg | Freq: Once | INTRAVENOUS | Status: DC

## 2024-02-20 MED ORDER — POLYETHYLENE GLYCOL 3350 17 G PO PACK
17.0000 g | PACK | Freq: Every day | ORAL | Status: DC
  Administered 2024-02-21 – 2024-02-25 (×5): 17 g
  Filled 2024-02-20 (×5): qty 1

## 2024-02-20 MED ORDER — POLYETHYLENE GLYCOL 3350 17 G PO PACK
17.0000 g | PACK | Freq: Every day | ORAL | Status: DC | PRN
Start: 2024-02-20 — End: 2024-02-28

## 2024-02-20 MED ORDER — THIAMINE HCL 100 MG/ML IJ SOLN
100.0000 mg | Freq: Every day | INTRAMUSCULAR | Status: DC
  Administered 2024-02-20 – 2024-03-02 (×11): 100 mg via INTRAVENOUS
  Filled 2024-02-20 (×11): qty 2

## 2024-02-20 MED ORDER — PROSOURCE TF20 ENFIT COMPATIBL EN LIQD
60.0000 mL | Freq: Three times a day (TID) | ENTERAL | Status: DC
  Administered 2024-02-21 (×2): 60 mL
  Filled 2024-02-20: qty 60

## 2024-02-20 MED ORDER — IOHEXOL 350 MG/ML SOLN
75.0000 mL | Freq: Once | INTRAVENOUS | Status: AC | PRN
  Administered 2024-02-20: 75 mL via INTRAVENOUS

## 2024-02-20 MED ORDER — FAMOTIDINE 20 MG PO TABS: 20.0000 mg | ORAL_TABLET | Freq: Two times a day (BID) | ORAL | Status: DC

## 2024-02-20 MED ORDER — SODIUM CHLORIDE 0.9% FLUSH
10.0000 mL | Freq: Two times a day (BID) | INTRAVENOUS | Status: DC
  Administered 2024-02-20 – 2024-02-21 (×2): 20 mL
  Administered 2024-02-21 – 2024-02-28 (×14): 10 mL
  Administered 2024-02-28: 40 mL
  Administered 2024-02-29: 10 mL
  Administered 2024-02-29: 40 mL
  Administered 2024-03-01: 10 mL
  Administered 2024-03-01: 15 mL
  Administered 2024-03-02: 30 mL
  Administered 2024-03-02 – 2024-03-12 (×20): 10 mL

## 2024-02-20 MED ORDER — PANTOPRAZOLE SODIUM 40 MG IV SOLR
40.0000 mg | Freq: Every day | INTRAVENOUS | Status: DC
  Administered 2024-02-20 – 2024-02-27 (×8): 40 mg via INTRAVENOUS
  Filled 2024-02-20 (×8): qty 10

## 2024-02-20 MED ORDER — DOBUTAMINE-DEXTROSE 4-5 MG/ML-% IV SOLN
5.0000 ug/kg/min | INTRAVENOUS | Status: DC
  Administered 2024-02-20: 2.5 ug/kg/min via INTRAVENOUS
  Administered 2024-02-21 – 2024-02-23 (×2): 5 ug/kg/min via INTRAVENOUS
  Filled 2024-02-20 (×4): qty 250

## 2024-02-20 MED ORDER — SODIUM CHLORIDE 0.9 % IV SOLN
2.0000 g | Freq: Two times a day (BID) | INTRAVENOUS | Status: DC
  Administered 2024-02-21 (×3): 2 g via INTRAVENOUS
  Filled 2024-02-20 (×4): qty 12.5

## 2024-02-20 MED ORDER — NALOXONE HCL 2 MG/2ML IJ SOSY: 4.0000 mg | PREFILLED_SYRINGE | Freq: Once | INTRAMUSCULAR | Status: DC

## 2024-02-20 MED ORDER — SODIUM BICARBONATE 8.4 % IV SOLN: 50.0000 meq | Freq: Once | INTRAVENOUS | Status: DC

## 2024-02-20 MED ORDER — SODIUM CHLORIDE 0.9 % IV SOLN
2.0000 g | Freq: Once | INTRAVENOUS | Status: AC
  Administered 2024-02-20: 2 g via INTRAVENOUS
  Filled 2024-02-20: qty 12.5

## 2024-02-20 MED ORDER — INSULIN ASPART 100 UNIT/ML IV SOLN
5.0000 [IU] | Freq: Once | INTRAVENOUS | Status: AC
  Administered 2024-02-20: 5 [IU] via INTRAVENOUS
  Filled 2024-02-20: qty 0.05

## 2024-02-20 MED ORDER — CHLORHEXIDINE GLUCONATE CLOTH 2 % EX PADS
6.0000 | MEDICATED_PAD | Freq: Every day | CUTANEOUS | Status: DC
Start: 2024-02-20 — End: 2024-02-29
  Administered 2024-02-20 – 2024-02-29 (×10): 6 via TOPICAL

## 2024-02-20 MED ORDER — METRONIDAZOLE 500 MG/100ML IV SOLN
500.0000 mg | Freq: Once | INTRAVENOUS | Status: AC
  Administered 2024-02-20: 500 mg via INTRAVENOUS
  Filled 2024-02-20: qty 100

## 2024-02-20 MED ORDER — DOCUSATE SODIUM 50 MG/5ML PO LIQD
100.0000 mg | Freq: Two times a day (BID) | ORAL | Status: DC
  Administered 2024-02-21 – 2024-02-25 (×9): 100 mg
  Filled 2024-02-20 (×9): qty 10

## 2024-02-20 MED ORDER — SODIUM CHLORIDE 0.9 % IV SOLN
1.0000 g | Freq: Once | INTRAVENOUS | Status: DC
  Filled 2024-02-20: qty 10

## 2024-02-20 MED ORDER — PERFLUTREN LIPID MICROSPHERE
1.0000 mL | INTRAVENOUS | Status: AC | PRN
  Administered 2024-02-20: 2 mL via INTRAVENOUS

## 2024-02-20 MED ORDER — VANCOMYCIN VARIABLE DOSE PER UNSTABLE RENAL FUNCTION (PHARMACIST DOSING): Status: DC

## 2024-02-20 MED ORDER — HEPARIN (PORCINE) 25000 UT/250ML-% IV SOLN: 1500.0000 [IU]/h | INTRAVENOUS | Status: DC

## 2024-02-20 NOTE — Progress Notes (Signed)
 PHARMACY - ANTICOAGULATION CONSULT NOTE  Pharmacy Consult for heparin infusion Indication: LV thrombus   No Known Allergies  Patient Measurements: Weight: 103.4 kg (228 lb)  Vital Signs: Temp: 93.7 F (34.3 C) (06/26 0815) Temp Source: Core (06/26 0815) BP: 135/69 (06/26 1006) Pulse Rate: 78 (06/26 0930)  Labs: Recent Labs    02/20/24 0751  HGB 11.9*  HCT 37.3*  PLT 355  CREATININE 2.22*  TROPONINIHS 344*    CrCl cannot be calculated (Unknown ideal weight.).   Medical History: Past Medical History:  Diagnosis Date   Schizophrenia (HCC)     Medications:  Scheduled:   calcium  chloride  1 g Intravenous Once   docusate  100 mg Per Tube BID   EPINEPHrine  1 mg Intravenous Once   EPINEPHrine  1 mg Intravenous Once   heparin  5,000 Units Subcutaneous Q8H   magnesium  sulfate  2 g Intravenous Once   naLOXone (NARCAN)  injection  4 mg Intravenous Once   pantoprazole  (PROTONIX ) IV  40 mg Intravenous QHS   polyethylene glycol  17 g Per Tube Daily   sodium bicarbonate  50 mEq Intravenous Once   Infusions:   ceFEPime (MAXIPIME) IV     metronidazole     norepinephrine  (LEVOPHED ) Adult infusion 7 mcg/min (02/20/24 0759)   propofol  (DIPRIVAN ) infusion 15 mcg/kg/min (02/20/24 1012)   vancomycin      Assessment: 73 y.o. male w/ PMH of CHF, HTN, CVA, schizophrenia admitted on 02/20/2024 with cardiac arrest, possible thrombus in right atrial appendage. Based on my review of medical records he is on no chronic anticoagulation prior to arrival  Goal of Therapy:  anti-Xa level  0.3-0.7 units/ml Monitor platelets by anticoagulation protocol: Yes   Plan:  ---Give 6000 units IV heparin bolus x 1 ---Start heparin infusion at 1500 units/hr ---Check anti-Xa level in 8 hours and at least once daily while on heparin ---Continue to monitor H&H and platelets  Miguel [name] 02/20/2024,10:12 AM

## 2024-02-20 NOTE — ED Notes (Signed)
 All meds given before 0800. See notes and MAR.

## 2024-02-20 NOTE — ED Triage Notes (Signed)
 Patient comes from jail.  His cell mate heard gurgling.  Staff applied pads, no shock advised and began CPR.  EMS gave 3 doses of epinephrine, and 2mg  of narcan.  ROSC was achieved on scene, and then lost in route.

## 2024-02-20 NOTE — ED Notes (Signed)
 Lab to come and draw blood from pt.

## 2024-02-20 NOTE — ED Provider Notes (Signed)
 [facility] Provider Note    Event Date/Time   First MD Initiated Contact with Patient 02/20/24 [phone]     (approximate)   History   Cardiac Arrest   HPI  Miguel [name] is a 73 year old male with history of CHF, stroke, hypertensive emergency, schizophrenia presenting to the emergency department for evaluation in cardiac arrest.  Patient comes from jail.  His cellmate heard him gurgling.  He was found without a pulse.  With EMS he had 3 doses of epi, 2 mg of Narcan.  Initial rhythm asystole.  Did have ROSC after receiving Narcan.  Initial arrest time estimated to be around 15 minutes.  He was noted become agitated, attempting to remove his Igel and route.  He was given Versed. Just prior to arriving to the ER, did have recurrent loss of pulses, CPR ongoing on presentation.     Physical Exam   Triage Vital Signs: ED Triage Vitals [02/20/24 0720]  Encounter Vitals Group     BP 103/62     Girls Systolic BP Percentile      Girls Diastolic BP Percentile      Boys Systolic BP Percentile      Boys Diastolic BP Percentile      Pulse Rate 92     Resp 16     Temp      Temp src      SpO2 94 %     Weight 228 lb (103.4 kg)     Height      Head Circumference      Peak Flow      Pain Score      Pain Loc      Pain Education      Exclude from Growth Chart     Most recent vital signs: Vitals:   02/20/24 0900 02/20/24 0915  BP: (!) 106/50 (!) 115/51  Pulse: 81 79  Resp: 18 (!) 7  Temp:    SpO2: 90% 95%    General: Unconscious, cold, CPR in progress Skin: Cold  Head: Normocephalic. Atraumatic. Neck: Supple, trachea midline. Eye: Pupils fixed, nonreactive Cardiovascular: Pulseless, CPR in progress with pulse present with compressions Respiratory: No spontaneous respirations, I-Gel in progress, bagging Gastrointestinal: Not tense MSK: No spontaneous movement, no signs of deformity. IO right tibia Neurological: Unconscious, not responding to  painful stimuli, GCS 3  ED Results / Procedures / Treatments   Labs (all labs ordered are listed, but only abnormal results are displayed) Labs Reviewed  LACTIC ACID, PLASMA - Abnormal; Notable for the following components:      Result Value   Lactic Acid, Venous 8.1 (*)    All other components within normal limits  COMPREHENSIVE METABOLIC PANEL WITH GFR - Abnormal; Notable for the following components:   Sodium 129 (*)    Potassium 5.3 (*)    Chloride 90 (*)    Glucose, Bld 110 (*)    BUN 49 (*)    Creatinine, Ser 2.22 (*)    Calcium  7.5 (*)    Total Protein 5.7 (*)    Albumin 2.7 (*)    AST 3,301 (*)    ALT 2,038 (*)    Alkaline Phosphatase 134 (*)    Total Bilirubin 5.5 (*)    GFR, Estimated 31 (*)    All other components within normal limits  URINALYSIS, W/ REFLEX TO CULTURE (INFECTION SUSPECTED) - Abnormal; Notable for the following components:   Color, Urine AMBER (*)    APPearance CLOUDY (*)  Hgb urine dipstick SMALL (*)    Protein, ur 100 (*)    Leukocytes,Ua MODERATE (*)    All other components within normal limits  BLOOD GAS, ARTERIAL - Abnormal; Notable for the following components:   pH, Arterial 7.04 (*)    pCO2 arterial 88 (*)    Acid-base deficit 8.7 (*)    All other components within normal limits  CBG MONITORING, ED - Abnormal; Notable for the following components:   Glucose-Capillary 17 (*)    All other components within normal limits  TROPONIN I (HIGH SENSITIVITY) - Abnormal; Notable for the following components:   Troponin I (High Sensitivity) 344 (*)    All other components within normal limits  CULTURE, BLOOD (ROUTINE X 2)  CULTURE, BLOOD (ROUTINE X 2)  URINE CULTURE  URINE DRUG SCREEN, QUALITATIVE ([facility] ONLY)  LACTIC ACID, PLASMA  CBC WITH DIFFERENTIAL/PLATELET  BRAIN NATRIURETIC PEPTIDE  ETHANOL  MAGNESIUM   TROPONIN I (HIGH SENSITIVITY)     EKG EKG independently reviewed and interpreted by myself demonstrates:  EKG demonstrates  sinus rhythm at a rate of 96, PR 169, QRS 108, QTc 478, nonspecific ST changes, no STEMI  RADIOLOGY Imaging independently reviewed and interpreted by myself demonstrates:  Chest x-Shante [place] with adequate tube positioning, interstitial edema  Formal Radiology Read:  DG Chest Portable 1 View Result Date: 02/20/2024 CLINICAL DATA:  ETT placement. EXAM: PORTABLE CHEST 1 VIEW COMPARISON:  04/27/2023. FINDINGS: Extensive artifact overlies the chest which limits the study. Diffuse pulmonary interstitial prominence. No definite pneumothorax or pleural effusion. Endotracheal tube tip about 3 cm above carina. NG tube tip superimposed with stomach below diaphragm. IMPRESSION: Limited study due to artifact. Diffuse interstitial prominence. Tubes and lines as above. Electronically Signed   By: Fonda Field M.D.   On: 02/20/2024 07:47    PROCEDURES:  Critical Care performed: Yes, see critical care procedure note(s)  CRITICAL CARE Performed by: Nilsa Dade   Total critical care time: 31 minutes  Critical care time was exclusive of separately billable procedures and treating other patients.  Critical care was necessary to treat or prevent imminent or life-threatening deterioration.  Critical care was time spent personally by me on the following activities: development of treatment plan with patient and/or surrogate as well as nursing, discussions with consultants, evaluation of patient's response to treatment, examination of patient, obtaining history from patient or surrogate, ordering and performing treatments and interventions, ordering and review of laboratory studies, ordering and review of radiographic studies, pulse oximetry and re-evaluation of patient's condition.   CPR  Date/Time: 02/20/2024 9:12 AM  Performed by: Dade Nilsa, MD Authorized by: Dade Nilsa, MD  CPR Procedure Details:      Amount of time prior to administration of ACLS/BLS (minutes):  1   ACLS/BLS initiated by EMS: Yes      CPR/ACLS performed in the ED: Yes     Duration of CPR (minutes):  8   Outcome: ROSC obtained    CPR performed via ACLS guidelines under my direct supervision.  See RN documentation for details including defibrillator use, medications, doses and timing.     MEDICATIONS ORDERED IN ED: Medications  propofol  (DIPRIVAN ) 1000 MG/100ML infusion (5 mcg/kg/min  103.4 kg Intravenous New Bag/Given 02/20/24 0722)  norepinephrine  (LEVOPHED ) 4mg  in (0.016 mg/mL) premix infusion (7 mcg/min Intravenous Rate/Dose Change 02/20/24 0759)  naloxone (NARCAN) injection 4 mg (has no administration in time range)  EPINEPHrine (ADRENALIN) 1 MG/10ML injection 1 mg (has no administration in time range)  EPINEPHrine (ADRENALIN)  1 MG/10ML injection 1 mg (has no administration in time range)  sodium bicarbonate injection 50 mEq (has no administration in time range)  calcium  chloride injection 1 g (has no administration in time range)  magnesium  sulfate (IV Push/IM) injection 2 g (has no administration in time range)  ceFEPIme (MAXIPIME) 2 g in sodium chloride  0.9 % 100 mL IVPB (has no administration in time range)  metroNIDAZOLE (FLAGYL) IVPB 500 mg (has no administration in time range)  vancomycin (VANCOCIN) IVPB 1000 mg/200 mL premix (has no administration in time range)  iohexol  (OMNIPAQUE ) 350 MG/ML injection 75 mL (75 mLs Intravenous Contrast Given 02/20/24 0838)     IMPRESSION / MDM / ASSESSMENT AND PLAN / ED COURSE  I reviewed the triage vital signs and the nursing notes.  Differential diagnosis includes, but is not limited to, cardiac arrest due to hypoxia, hypercarbia in the setting of volume overload, drug overdose, intracranial bleed, pulmonary embolism, intra-abdominal pathology, ACS, hypoglycemia  Patient's presentation is most consistent with acute presentation with potential threat to life or bodily function.  73 year old male presenting to the emergency department for evaluation in cardiac  arrest.  Limited initial history, broad differential.  Arrives with ongoing CPR.  Resuscitation continued in accordance with ACLS protocols.  Patient received 2 additional doses of epinephrine, 4 mg of Narcan, 1 amp of bicarb, 1 g of calcium .  His Igel was exchanged for a ET tube with ongoing compressions (no RSI meds given).  There was difficulty with obtaining IV access, so a nonsterile right femoral central line was placed for rapid access in the periarrest period. ROSC obtained at 0701.  Post ROSC, patient was noted to have bigeminy for which she was given 2 g of magnesium .  His glucose shortly post ROSC was significantly low at 17, he was given an amp of D50.  Labs sent.  Given undifferentiated presentation, multiple CT scans ordered to further evaluate for possible etiologies.  Initially hypotensive postarrest, did have downtrending blood pressure for which she was initiated on Levophed .  Ultimate plan for ICU admission.  Labs starting to return.  Notable for significant acidosis with pH of 7.04, PCO2 of 88.  Glucose improved to 110 on CMP.  Transaminitis noted.  Troponin elevated.  Elevated lactate at 8.1.  Suspect many of these abnormalities are related to patient's cardiac arrest.  I did order broad-spectrum antibiotics and initiated code sepsis, but I suspect that patient's hypotension and lactic acidosis are likely multifactorial.  He has a history of CHF and I suspect he may be acutely volume overloaded.  I do think large-volume fluid resuscitation would be detrimental this patient and thus we will hold fluids until further information can be obtained.  Will reach out to ICU team to discuss admission.   Clinical Course as of 02/20/24 0919  Thu Feb 20, 2024  0859 Case discussed with Dr. Isadora with ICU.  He will evaluate patient for anticipated admission. [NR]    Clinical Course User Index [NR] Levander Slate, MD     FINAL CLINICAL IMPRESSION(S) / ED DIAGNOSES   Final diagnoses:  Cardiac  arrest (HCC)  Elevated troponin  Transaminitis     Rx / DC Orders   ED Discharge Orders     None        Note:  This document was prepared using Dragon voice recognition software and may include unintentional dictation errors.   Levander Slate, MD 02/20/24 [phone]

## 2024-02-20 NOTE — Procedures (Signed)
 Central Venous Catheter Insertion Procedure Note  RICHIE BONANNO  [id number]  October 16, 1950  Date:02/20/24  Time:11:21 AM   Provider Performing:Lamere Lightner KANDICE Moose   Procedure: Insertion of Non-tunneled Central Venous [phone]) with US  guidance ([id number])   Indication(s) Medication administration and Difficult access  Consent Unable to obtain consent due to emergent nature of procedure. (Pt in police custody)   Anesthesia Topical only with 1% lidocaine    Timeout Verified patient identification, verified procedure, site/side was marked, verified correct patient position, special equipment/implants available, medications/allergies/relevant history reviewed, required imaging and test results available.  Sterile Technique Maximal sterile technique including full sterile barrier drape, hand hygiene, sterile gown, sterile gloves, mask, hair covering, sterile ultrasound probe cover (if used).  Procedure Description Area of catheter insertion was cleaned with chlorhexidine  and draped in sterile fashion.  With real-time ultrasound guidance a central venous catheter was placed into the left internal jugular vein. Nonpulsatile blood flow and easy flushing noted in all ports.  The catheter was sutured in place and sterile dressing applied.  Complications/Tolerance None; patient tolerated the procedure well. Chest X-ray is ordered to verify placement for internal jugular or subclavian cannulation.   Chest x-ray is not ordered for femoral cannulation.  EBL Minimal  Specimen(s) None  Lonell Moose, AGNP  Pulmonary/Critical Care Pager [phone] (please enter 7 digits) PCCM Consult Pager [phone] (please enter 7 digits)

## 2024-02-20 NOTE — H&P (Addendum)
 NAME:  Miguel [name], MRN:  [id number], DOB:  Jun 25, 1951, LOS: 0 ADMISSION DATE:  02/20/2024, CONSULTATION DATE: 02/20/2024 REFERRING MD: Dr. Levander, CHIEF COMPLAINT: Cardiac Arrest    History of Present Illness:  This is a 73 yo male who presented to [facility] ER via EMS from jail on 06/26 post cardiac arrest.  Per ER notes pts cellmate heard the pt gurgling this morning.  He was found pulseless with initial cardiac rhythm asystole.  EMS administered 3 doses of epi/2 mg of narcan.  Following administration of narcan ROSC achieved.  Estimated downtime 15 minutes.  EMS reported en route to the ER pt became agitated and attempted to remove Igel.  EMS administered versed.  Pt became pulseless again en route ACLS protocol initiated.  ED Course  Upon arrival to the ER pt remained pulseless in PEA.  ACLS protocol continued pt received: 2 mg of epi/4 mg of iv narcan/1 amp of bicarb/1 g of calcium .  Igel exchanged for an ETT with ongoing CPR (pt did not require RSI medications for intubation).  ROSC achieved with estimated downtime during 2nd cardiac arrest 15 minutes.  CTA Chest negative for PE.  Significant lab results were: Na+ 129/K+ 5.3/chloride 90/glucose 110/BUN 49/creatinine 2.22/calcium  7.5/mag 3.2/alk phos 134/AST 3,301/ALT 2,038/total bilirubin 5.5/troponin 344/lactic acid 8.1/urine drug screen negative/UA concerning for possible UTI.  Sepsis protocol initiated pt to receive: cefepime/metronidazole/vancomycin.  Pt found to be severely hypoglycemic CBG 17 requiring 1 amp of D50W.  Levophed  gtt initiated due to hypotension.  PCCM team contacted for ICU admission.  Pt remains in police custody with bilateral ankle cuffs in place, and police officer at bedside.   Pertinent  Medical History  Schizophrenia  Depression Current Smoker  Chronic Systolic CHF (Echo 09/11/22: EF <20%, grade II diastolic dysfunction, trivial pericardial effusion, mild mitral valve regurgitation, trivial tricuspid valve  regurgitation, trivial pulmonic valve regurgitation) CVA with aphasia  Micro Data:   MRSA PCR 06/26>> COVID/Influenza A&B/RSV 06/26>> RVP 06/26>> Blood 06/26>> Tracheal aspirate 06/26>> Urine 06/26>> Legionella pneum ur ag 06/26>>  Anti-infectives (From admission, onward)    Start     Dose/Rate Route Frequency Ordered Stop   02/21/24 0000  ceFEPIme (MAXIPIME) 2 g in sodium chloride  0.9 % 100 mL IVPB        2 g 200 mL/hr over 30 Minutes Intravenous Every 12 hours 02/20/24 1051     02/20/24 1200  vancomycin (VANCOREADY) IVPB 2000 mg/400 mL        2,000 mg 200 mL/hr over 120 Minutes Intravenous  Once 02/20/24 1007     02/20/24 1050  vancomycin variable dose per unstable renal function (pharmacist dosing)         Does not apply See admin instructions 02/20/24 1051     02/20/24 0845  ceFEPIme (MAXIPIME) 2 g in sodium chloride  0.9 % 100 mL IVPB        2 g 200 mL/hr over 30 Minutes Intravenous  Once 02/20/24 0838     02/20/24 0845  metroNIDAZOLE (FLAGYL) IVPB 500 mg        500 mg 100 mL/hr over 60 Minutes Intravenous  Once 02/20/24 0838     02/20/24 0845  vancomycin (VANCOCIN) IVPB 1000 mg/200 mL premix  Status:  Discontinued        1,000 mg 200 mL/hr over 60 Minutes Intravenous  Once 02/20/24 0838 02/20/24 1007      Significant Hospital Events: Including procedures, antibiotic start and stop dates in addition to other pertinent events  06/26: Pt admitted post cardiac arrest mechanically intubated with possible cardiogenic/possible septic shock with multiorgan failure requiring levophed  gtt   Interim History / Subjective:  Pt currently requiring levophed  gtt @7  mcg/min to maintain map 65 or higher.  Sedated with propofol  gtt @5  mcg/kg/min.  Currently hypothermic temp 93.7 degrees F.  Remains mechanically intubated vent settings: PEEP 8/FiO2 80%/TV 450  Objective    Blood pressure (!) 92/40, pulse 81, temperature (!) 93.7 F (34.3 C), temperature source Core, resp. rate 15,  weight 103.4 kg, SpO2 95%.       No intake or output data in the 24 hours ending 02/20/24 0904 Filed Weights   02/20/24 0720  Weight: 103.4 kg    Examination: General: Acute on chronically-ill appearing male, NAD mechanically intubated  HENT: Very poor dentition, mild JVD  Lungs: Rhonchi throughout, even, non labored  Cardiovascular: Sinus rhythm, s1s2, no m/r/g, 1+ radial/1+ distal pulses, 1+ bilateral lower extremity edema  Abdomen: +BS x4, soft, non distended  Extremities: Normal bulk and tone Skin: Bilateral ankle cuffs present without signs of skin breakdown, no pressure injuries present on admission  Neuro: Sedated, not following commands or withdrawing from stimulation, bilateral pupils 2 mm sluggish GU: Indwelling foley catheter draining cloudy yellow urine   Resolved problem list   Assessment and Plan   #Acute metabolic encephalopathy  #Mechanical intubation pain/discomfort Hx: Polysubstance abuse, depression, and schizoaffective disorder - Urine drug screen negative  - Correct metabolic derangements  - CT Head results pending  - Ethanol level pending  - Maintain RASS goal of 0 to -1 while pt mechanically intubated  - PAD protocol to maintain RASS goal: Propofol  gtt and prn fentanyl   - WUA daily  - Maintain normothermia post cardiac arrest  - EEG pending - If mentation does not improve in the next 72 hrs will order MRI Brain   #Cardiac arrest (PEA)  #Cardiogenic/possible septic shock  #Acute on chronic systolic CHF (Echo 09/21/22: EF <20% grade II diastolic dysfunction) #Elevated troponin suspect secondary to demand ischemia  #Possible thrombus in right atrial appendage  Hx: CVA with aphasia  CTA Chest PE 02/20/24: No evidence of acute pulmonary embolism. Rounded filling defect within the right atrial appendage, suspicious for thrombus. - Continuous telemetry monitoring  - Trend troponin until peaked  - Will trend coox based on results may require inotrope  -  Prn levophed  gtt to maintain map 65 or higher  - Hold outpatient diuretics, antihypertensives, and beta-blockers for now  - Stat Echo pending  - BNP results pending  - Will start heparin gtt: dosing per pharmacy  #Acute respiratory failure  #Bilateral pleural effusions right greater than left  #Possible pneumonia suspect due to aspiration  #Mechanical intubation  Hx: Tobacco abuse  - Full vent support for now: vent settings reviewed and established  - Continue lung protective strategies  - Maintain plateau pressures less than 30 cm H2O - VAP bundle implemented  - SBT once all parameters met  - Intermittent CXR's and ABG's  - Prn bronchodilator therapy   #Acute kidney injury secondary to ATN  #Lactic acidosis  #Hyponatremia  #Hyperkalemia  - Trend BMP and lactic acid  - Replace electrolytes as indicated  - Strict I&O's - Avoid nephrotic agents as able   #Shock liver  #Elevated alk phos and total bilirubin  CT Abd/Pelvis 06/26: Mild diffuse mural thickening, which may be seen in the setting of hypoperfusion. Small volume free fluid. - Trend hepatic function panel - Acute hepatitis panel pending  -  Avoid hepatotoxic agents as able   #Sepsis  #Possible pneumonia likely due to aspiration  #Possible UTI  - Trend WBC and monitor fever curve  - Trend PCT  - Follow cultures  - Continue empiric abx as outlined above pending culture results and sensitivities   #Macrocytic anemia  - Trend CBC  - Monitor for s/sx of bleeding  - Transfuse fo rhgb <7 - Folic acid and vitamin B12 levels pending   #Hypoglycemia  - CBG's q2hrs for now  - Follow hypo/hyperglycemic protocol - Target CBG range 140 to 180  Best Practice (right click and Reselect all SmartList Selections daily)   Diet/type: NPO; dietitian consult to start TF's  DVT prophylaxis systemic heparin Pressure ulcer(s): N/A GI prophylaxis: PPI Lines: Central line Foley:  Yes, and it is still needed Code Status:  full  code Last date of multidisciplinary goals of care discussion [N/A]  Unable to updated pts family regarding pts hospitalization he is currently in police custody  Labs   CBC: No results for input(s): WBC, NEUTROABS, HGB, HCT, MCV, PLT in the last 168 hours.  Basic Metabolic Panel: Recent Labs  Lab 02/20/24 0751  NA 129*  K 5.3*  CL 90*  CO2 25  GLUCOSE 110*  BUN 49*  CREATININE 2.22*  CALCIUM  7.5*   GFR: CrCl cannot be calculated (Unknown ideal weight.). Recent Labs  Lab 02/20/24 0751  LATICACIDVEN 8.1*    Liver Function Tests: Recent Labs  Lab 02/20/24 0751  AST PENDING  ALT 2,038*  ALKPHOS 134*  BILITOT 5.5*  PROT 5.7*  ALBUMIN 2.7*   No results for input(s): LIPASE, AMYLASE in the last 168 hours. No results for input(s): AMMONIA in the last 168 hours.  ABG    Component Value Date/Time   PHART 7.04 (LL) 02/20/2024 0734   PCO2ART 88 (HH) 02/20/2024 0734   PO2ART 84 02/20/2024 0734   HCO3 23.8 02/20/2024 0734   ACIDBASEDEF 8.7 (H) 02/20/2024 0734   O2SAT 93.6 02/20/2024 0734     Coagulation Profile: No results for input(s): INR, PROTIME in the last 168 hours.  Cardiac Enzymes: No results for input(s): CKTOTAL, CKMB, CKMBINDEX, TROPONINI in the last 168 hours.  HbA1C: Hgb A1c MFr Bld  Date/Time Value Ref Range Status  07/14/2018 04:29 AM 5.8 (H) 4.8 - 5.6 % Final    Comment:    (NOTE) Pre diabetes:          5.7%-6.4% Diabetes:              >6.4% Glycemic control for   <7.0% adults with diabetes   07/13/2018 04:25 AM 6.2 (H) 4.8 - 5.6 % Final    Comment:    (NOTE) Pre diabetes:          5.7%-6.4% Diabetes:              >6.4% Glycemic control for   <7.0% adults with diabetes     CBG: Recent Labs  Lab 02/20/24 0703  GLUCAP 17*    Review of Systems:   Unable to assess pt mechanically intubated   Past Medical History:  He,  has a past medical history of Schizophrenia (HCC).   Surgical History:    Past Surgical History:  Procedure Laterality Date   APPENDECTOMY       Social History:   reports that he has never smoked. He has never used smokeless tobacco. He reports that he does not drink alcohol and does not use drugs.   Family History:  His  family history is not on file.   Allergies No Known Allergies   Home Medications  Prior to Admission medications   Medication Sig Start Date End Date Taking? Authorizing Provider  ARIPiprazole  (ABILIFY ) 5 MG tablet Take 1 tablet (5 mg total) by mouth daily. 09/17/22   Jens Durand, MD  ARISTADA 882 MG/3.2ML prefilled syringe Inject 882 mg into the muscle every 30 (thirty) days. 08/23/22   [provider]  atorvastatin  (LIPITOR) 40 MG tablet Take 1 tablet (40 mg total) by mouth daily at 6 PM. 07/15/18   Laurence Bridegroom, MD  divalproex  (DEPAKOTE ) 500 MG DR tablet Take 1 tablet (500 mg total) by mouth every 12 (twelve) hours. 09/17/22   Jens Durand, MD  furosemide  (LASIX ) 40 MG tablet Take 1 tablet (40 mg total) by mouth daily. 06/19/22 06/19/23  Arlander Charleston, MD  lisinopril  (ZESTRIL ) 10 MG tablet Take 1 tablet (10 mg total) by mouth daily. 09/17/22   Jens Durand, MD  metoprolol  succinate (TOPROL -XL) 25 MG 24 hr tablet Take 1 tablet (25 mg total) by mouth daily. 09/17/22   Jens Durand, MD  ondansetron  (ZOFRAN -ODT) 4 MG disintegrating tablet Take 1 tablet (4 mg total) by mouth every 6 (six) hours as needed for nausea or vomiting. 04/27/23   Ward, Josette SAILOR, DO  pantoprazole  (PROTONIX ) 40 MG tablet Take 1 tablet (40 mg total) by mouth daily. 04/27/23 04/26/24  Ward, Josette SAILOR, DO     Critical care time: 70 minutes      Lonell Moose, AGNP  Pulmonary/Critical Care Pager [phone] (please enter 7 digits) PCCM Consult Pager [phone] (please enter 7 digits)

## 2024-02-20 NOTE — ED Notes (Signed)
 Report given to [facility], RN ICU

## 2024-02-20 NOTE — ED Notes (Addendum)
 0654 1 mg epinephrine 0655 10 mg IV narcan 0656  Pulse check, PEA 0659 1 mg Epinephrine 0701 ROSC 0708 2g Magnesium 

## 2024-02-20 NOTE — Progress Notes (Signed)
 Initial Nutrition Assessment  DOCUMENTATION CODES:   Obesity unspecified, Non-severe (moderate) malnutrition in context of social or environmental circumstances  INTERVENTION:   -TF via OGT:   Initiate Vital 1.5 @ 20 ml/hr and increase by 10 ml every 8 hours to goal rate of 40 ml/hr.   60 ml Prosource TF20 TID  30 ml free water flush every 4 hours  Tube feeding regimen provides 1680 kcal (100% of needs), 125 grams of protein, and 733 ml of H2O.  Total free water: 913 ml daily  -MVI with minerals daily via tube -100 mg thiamine daily x 7 days via tube -Monitor Mg, K, and Phos and replete as appropriate secondary to high refeeding risk   NUTRITION DIAGNOSIS:   Moderate Malnutrition related to social / environmental circumstances as evidenced by mild fat depletion, mild muscle depletion, edema.  GOAL:   Patient will meet greater than or equal to 90% of their needs  MONITOR:   Vent status, TF tolerance  REASON FOR ASSESSMENT:   Consult, Ventilator Enteral/tube feeding initiation and management  ASSESSMENT:   Pt with history of CHF, stroke, hypertensive emergency, schizophrenia presenting for evaluation of cardiac arrest  Pt admitted post cardiac arrest mechanically intubated with possible cardiogenic/possible septic shock with multiorgan failure requiring levophed  gtt.  6/26- intubated, OGT placed (KUB reveals tip of tube in stomach)  Patient is currently intubated on ventilator support. OGT currently connected to low, intermittent suction.  MV: 10.1 L/min Temp (24hrs), Avg:93.9 F (34.4 C), Min:93.7 F (34.3 C), Max:94 F (34.4 C)  Propofol : 9.3 ml/hr (provided 246 kcals daily)  Reviewed I/O's: +96 ml x 24 hours  Per H&P, estimated downtime 15 minutes.   Per MD, plan to initiate TF today.   No wt loss noted. Pt with moderate edema, which is likely masking true weight loss as well as further fat and muscle deletions.   Medications reviewed and include  colace, magnesium  sulfate, narcan, protonix , levophed , and propofol .   Labs reviewed: Na: 129, K: 5.3, Mg: 3.2, CBGS: 124 (inpatient orders for glycemic control are none). Tox screen negative.   NUTRITION - FOCUSED PHYSICAL EXAM:  Flowsheet Row Most Recent Value  Orbital Region Mild depletion  Upper Arm Region No depletion  Thoracic and Lumbar Region No depletion  Buccal Region Mild depletion  Temple Region Mild depletion  Clavicle Bone Region Mild depletion  Clavicle and Acromion Bone Region Mild depletion  Scapular Bone Region Mild depletion  Dorsal Hand Mild depletion  Patellar Region No depletion  Anterior Thigh Region No depletion  Posterior Calf Region No depletion  Edema (RD Assessment) Moderate  Hair Reviewed  Eyes Reviewed  Mouth Reviewed  Skin Reviewed  Nails Reviewed    Diet Order:   Diet Order             Diet NPO time specified  Diet effective now                   EDUCATION NEEDS:   Not appropriate for education at this time  Skin:  Skin Assessment: Reviewed RN Assessment  Last BM:  Unknown  Height:   Ht Readings from Last 1 Encounters:  04/27/23 6' (1.829 m)    Weight:   Wt Readings from Last 1 Encounters:  02/20/24 103.4 kg    Ideal Body Weight:  80.9 kg  BMI:  Body mass index is 30.92 kg/m.  Estimated Nutritional Needs:   Kcal:  1604  Protein:  120-135 grams  Fluid:  1.6-1.8  LITTIE Margery ORN, RD, LDN, CDCES Registered Dietitian III Certified Diabetes Care and Education Specialist If unable to reach this RD, please use RD Inpatient group chat on secure chat between hours of 8am-4 pm daily

## 2024-02-20 NOTE — Progress Notes (Signed)
 Eeg done

## 2024-02-20 NOTE — Progress Notes (Signed)
 PHARMACY CONSULT NOTE - FOLLOW UP  Pharmacy Consult for Electrolyte Monitoring and Replacement   Recent Labs: Potassium (mmol/L)  Date Value  02/20/2024 5.2 (H)  09/15/2012 4.4   Magnesium  (mg/dL)  Date Value  93/73/7974 3.2 (H)   Calcium  (mg/dL)  Date Value  93/73/7974 8.0 (L)   Calcium , Total (mg/dL)  Date Value  98/79/7985 9.4   Albumin (g/dL)  Date Value  93/73/7974 2.7 (L)  09/15/2012 4.2   Phosphorus (mg/dL)  Date Value  93/73/7974 6.4 (H)   Sodium (mmol/L)  Date Value  02/20/2024 130 (L)  09/15/2012 146 (H)     Assessment: 73 y.o. male w/ PMH of CHF, HTN, CVA, schizophrenia admitted on 02/20/2024 with cardiac arrest. Pharmacy is asked to follow and replace electrolytes while in CCU   Goal of Therapy:  Electrolytes WNL  Plan:  ---5 units IV insulin + D50 x 1 ---repeat BMP at 1800 and again in am  Adriana [name] Bolster ,PharmD Clinical Pharmacist 02/20/2024 3:18 PM

## 2024-02-20 NOTE — Sepsis Progress Note (Signed)
 Vancomycin was started and stopped due to dose change. Pt was transferred to ICU and a new central line will be placed. RN plans to start antibiotics as soon as the line in.

## 2024-02-20 NOTE — Procedures (Signed)
 Bronchoscopy Procedure Note  SPIKE DESILETS  [id number]  October 29, 1950  Date:02/20/24  Time:12:40 PM   Provider Performing:Jairen Goldfarb   Procedure(s):  Flexible Bronchoscopy [phone]) and Initial Therapeutic Aspiration of Tracheobronchial Tree [phone])  Indication(s) Respiratory failure, aspiration post cardiac arrest  Consent Risks of the procedure as well as the alternatives and risks of each were explained to the patient and/or caregiver.  Consent for the procedure was obtained and is signed in the bedside chart  Anesthesia Propofol  gtt   Time Out Verified patient identification, verified procedure, site/side was marked, verified correct patient position, special equipment/implants available, medications/allergies/relevant history reviewed, required imaging and test results available.   Sterile Technique Usual hand hygiene, masks, gowns, and gloves were used   Procedure Description Bronchoscope advanced through endotracheal tube and into airway.  Airways were examined down to subsegmental level with findings noted below.   Following diagnostic evaluation, Therapeutic aspiration performed in the entire tracheobronchial tree.  Findings: gastric content noted in the entire tracheobronchial tree, consistent with aspiration following cardiac arrest. These were therapeutically suctioned.   Complications/Tolerance None; patient tolerated the procedure well. Chest X-ray is not needed post procedure.   EBL Minimal   Specimen(s) None  Belva November, MD [facility] 02/20/2024 12:41 PM

## 2024-02-20 NOTE — Consult Note (Signed)
 Cardiology Consultation:   Patient ID: Miguel [name]; [id number]; 01/02/1951   Admit date: 02/20/2024 Date of Consult: 02/20/2024  Primary Care Provider: [facility] Primary Cardiologist: New - consult by Miguel [name] Primary Electrophysiologist:  None   Patient Profile:   Miguel [name] is a 73 y.o. male with a hx of biventricular failure, schizoaffective disorder, prior cocaine use, and CVA in 2019 who is being seen today for the evaluation of out-of-hospital cardiac arrest at the request of Dr. Isadora, MD.  History of Present Illness:   Miguel [name] was previously evaluated by Dr. Florencio with [facility] cardiology in 2016 for abnormal EKG, angina, elevated troponin, and murmur.  Echo and stress test were recommended, though not pursued.  He was admitted in 06/2018 with CVA.  Echo from 06/2018 showed an EF of 25 to 30%, diffuse hypokinesis, mild mitral regurgitation, normal RV systolic function and RVSP.  Notation of high risk for LV mural thrombus on report.  At time of discharge it was recommended aspirin  be transition to warfarin.  Further details of following up of pharmacotherapy monitoring are unclear, as the patient was incarcerated at that time.  He was admitted in 08/2019 with acute hypoxic respiratory failure felt to be multifactorial including COPD exacerbation and volume overload.  The patient self extubated during that admission.  He was cocaine positive.   Echo in 08/2022 showed an EF less than 20%, global hypokinesis, mild LVH, grade 2 diastolic dysfunction, severely reduced RV systolic function with moderately enlarged ventricular cavity size, mildly dilated right atrium, moderate pleural effusion in the left lateral region, mild mitral regurgitation, and aortic sclerosis without evidence of stenosis.   Patient [facility] jail on 02/20/2024 with out-of-hospital cardiac arrest.  Per ER notes, the patient's cellmate heard the patient gurgling  this morning (for an undetermined amount of time).  He was found to be pulseless with initial cardiac rhythm of asystole.  EMS administered 3 doses of epi and 2 mg of Narcan.  Following administration of Narcan, ROSC was achieved.  Uncertain downtime duration.  EMS reported en route the patient became agitated and attempted to remove Igel leading to administration of Versed.  Patient became pulseless again and route with ACLS initiated.  Upon arrival to the ER the patient remained pulseless in PEA arrest.  ACLS was continued.  Patient received 2 mg of epi, 4 mg of IV Narcan, 1 amp of bicarb, 1 g of calcium .  Igel exchanged for an ETT with ongoing CPR (patient did not require RSI medications for intubation).  ROSC achieved with estimated downtime during 2nd cardiac arrest of 15 minutes.  Initial EKG showed sinus rhythm with inferior T wave inversion.  CTA of the chest was negative for PE.  CT of the abdomen and pelvis with rounded filling defect in the right atrial appendage suspicious for thrombus, multilobar findings suspicious for atelectasis, pulmonary edema, and aspiration, moderate right and small left pleural effusions, reflux of contrast media into the hepatic veins suggestive of right heart dysfunction, multiple bilateral anterior rib fractures, diffuse mural thickening suggestive of hypoperfusion, small volume free fluid, and aortic atherosclerosis.  CT of the head with a focal area of abnormal density within the left frontal lobe possibly representative of a subacute cortical infarct and interval development of a focal area of diminished attenuation within the subcortical white matter of the right middle lobe possibly representing chronic subcortical infarct.  Initial high-sensitivity troponin 344 with a delta troponin 528.  BNP 819.  ABG with pH 7.04 with a PCO2 of 88.  Lactic acid 8.1 trending to 7.3.  Glucose 17 requiring D50W.  Potassium 5.3, sodium 129, BUN 49, serum creatinine 2.22, albumin 2.7, AST  3301, ALT 2038.  WBC 14.7, PLT 11.9.  Magnesium  3.2.  Coox with total hemoglobin of 6.9 with an O2 saturation of 75.9.  Urine drug screen negative.  He has received broad-spectrum antibiotics and been placed on a Levophed  drip due to hypotension.  He remains intubated and sedated.  2 sisters and a niece are at bedside during cardiology consult.    Past Medical History:  Diagnosis Date   Biventricular failure (HCC)    CVA (cerebral vascular accident) (HCC)    Polysubstance use disorder    Schizophrenia (HCC)     Past Surgical History:  Procedure Laterality Date   APPENDECTOMY       Home Meds: Prior to Admission medications   Medication Sig Start Date End Date Taking? Authorizing Provider  ARISTADA 882 MG/3.2ML prefilled syringe Inject 882 mg into the muscle every 30 (thirty) days. 08/23/22  Yes [provider]  atorvastatin  (LIPITOR) 40 MG tablet Take 1 tablet (40 mg total) by mouth daily at 6 PM. 07/15/18  Yes Miguel Bridegroom, MD  furosemide  (LASIX ) 40 MG tablet Take 1 tablet (40 mg total) by mouth daily. 06/19/22 02/20/24 Yes Miguel Charleston, MD  losartan (COZAAR) 25 MG tablet Take 25 mg by mouth daily. 02/10/24  Yes [provider]    Inpatient Medications: Scheduled Meds:  calcium  chloride  1 g Intravenous Once   Chlorhexidine  Gluconate Cloth  6 each Topical Daily   docusate  100 mg Per Tube BID   EPINEPHrine  1 mg Intravenous Once   EPINEPHrine  1 mg Intravenous Once   feeding supplement (PROSource TF20)  60 mL Per Tube TID   free water  30 mL Per Tube Q4H   heparin  6,000 Units Intravenous Once   magnesium  sulfate  2 g Intravenous Once   multivitamin with minerals  1 tablet Per Tube Daily   naLOXone (NARCAN)  injection  4 mg Intravenous Once   pantoprazole  (PROTONIX ) IV  40 mg Intravenous QHS   polyethylene glycol  17 g Per Tube Daily   sodium bicarbonate  50 mEq Intravenous Once   sodium chloride  flush  10-40 mL Intracatheter Q12H   thiamine  100 mg Per Tube  Daily   vancomycin variable dose per unstable renal function (pharmacist dosing)   Does not apply See admin instructions   Continuous Infusions:  ceFEPime (MAXIPIME) IV     [START ON 02/21/2024] ceFEPime (MAXIPIME) IV     feeding supplement (VITAL 1.5 CAL)     heparin     metronidazole     norepinephrine  (LEVOPHED ) Adult infusion 3 mcg/min (02/20/24 1100)   propofol  (DIPRIVAN ) infusion 15 mcg/kg/min (02/20/24 1100)   vancomycin 2,000 mg (02/20/24 1200)   PRN Meds: docusate sodium , fentaNYL  (SUBLIMAZE ) injection, fentaNYL  (SUBLIMAZE ) injection, polyethylene glycol, sodium chloride  flush  Allergies:  No Known Allergies  Social History:   Social History   Socioeconomic History   Marital status: Divorced    Spouse name: Not on file   Number of children: Not on file   Years of education: Not on file   Highest education level: Not on file  Occupational History   Not on file  Tobacco Use   Smoking status: Never   Smokeless tobacco: Never  Vaping Use   Vaping status: Never  Used  Substance and Sexual Activity   Alcohol use: No   Drug use: No   Sexual activity: Not on file  Other Topics Concern   Not on file  Social History Narrative   Not on file   Social Drivers of Health   Financial Resource Strain: Not on file  Food Insecurity: Not on file  Transportation Needs: Not on file  Physical Activity: Not on file  Stress: Not on file  Social Connections: Not on file  Intimate Partner Violence: Not on file     Family History:   Family History  Family history unknown: Yes   Patient currently intubated and sedated, unable to obtain family history  ROS:  Review of Systems  Unable to perform ROS: Intubated      Physical Exam/Data:   Vitals:   02/20/24 0930 02/20/24 1006 02/20/24 1100 02/20/24 1200  BP: (!) 125/54 135/69 104/61 132/65  Pulse: 78  65 63  Resp: 20 15 19  (!) 0  Temp:   (!) 94 F (34.4 C) (!) 93.4 F (34.1 C)  TempSrc:   Bladder   SpO2: 96%  90% 97%   Weight:        Intake/Output Summary (Last 24 hours) at 02/20/2024 1315 Last data filed at 02/20/2024 1100 Gross per 24 hour  Intake 96.23 ml  Output --  Net 96.23 ml   Filed Weights   02/20/24 0720  Weight: 103.4 kg   Body mass index is 30.92 kg/m.   Physical Exam: General: Acute on chronically ill-appearing, in no acute distress. Head: Normocephalic, atraumatic, nares without discharge.  Neck: JVD difficult to assess secondary to respiratory support apparatus. Lungs: Diminished and vented breath sounds bilaterally. Heart: RRR with S1 S2. No murmurs, rubs, or gallops appreciated. Abdomen: Soft, non-tender, mildly distended. Msk:  Tone appears normal for age. Extremities: Chronic woody edema to the bilateral mid shins. Neuro: Intubated and sedated. Psych: Intubated and sedated.   EKG:  The EKG was personally reviewed and demonstrates: NSR, 96 bpm, right axis deviation, inferior T wave inversion.  Repeat EKG NSR, 71 bpm, improvement in inferior ST-T changes, nonspecific lateral ST-T changes Telemetry:  Telemetry was personally reviewed and demonstrates: SR, 60s bpm  Weights: Filed Weights   02/20/24 0720  Weight: 103.4 kg    Relevant CV Studies:  2D echo 02/20/2024: 1. Left ventricular ejection fraction, by estimation, is 20 to 25%. Left  ventricular ejection fraction by 2D MOD biplane is 26.0 %. The left  ventricle has severely decreased function. The left ventricle demonstrates  global hypokinesis. Left ventricular  diastolic parameters are indeterminate.   2. Definity images detailing very slow flow in the periapical region,  possible mural thrombus in the apical region.   3. Right ventricular systolic function is severely reduced. The right  ventricular size is moderately enlarged. There is normal pulmonary artery  systolic pressure. The estimated right ventricular systolic pressure is  15.0 mmHg.   4. The mitral valve is normal in structure. No evidence of  mitral valve  regurgitation. No evidence of mitral stenosis.   5. The aortic valve has an indeterminant number of cusps. Aortic valve  regurgitation is not visualized. Aortic valve sclerosis is present, with  no evidence of aortic valve stenosis.   6. The inferior vena cava is dilated in size with >50% respiratory  variability, suggesting right atrial pressure of 8 mmHg.  __________  2D echo 09/11/2022: 1. Left ventricular ejection fraction, by estimation, is <20%. The left  ventricle  has severely decreased function. The left ventricle demonstrates  global hypokinesis. There is mild left ventricular hypertrophy. Left  ventricular diastolic parameters are  consistent with Grade II diastolic dysfunction (pseudonormalization).  Elevated left atrial pressure.   2. Right ventricular systolic function is severely reduced. The right  ventricular size is moderately enlarged.   3. Right atrial size was mildly dilated.   4. Moderate pleural effusion in the left lateral region.   5. The mitral valve is abnormal. Mild mitral valve regurgitation.   6. The aortic valve is tricuspid. There is moderate thickening of the  aortic valve. Aortic valve regurgitation is not visualized. Aortic valve  sclerosis is present, with no evidence of aortic valve stenosis.  __________  2D echo 07/13/2018: - Left ventricle: The cavity size was mildly dilated. There was    mild concentric hypertrophy. Systolic function was severely    reduced. The estimated ejection fraction was in the range of 25%    to 30%. Diffuse hypokinesis. Hypokinesis of the anteroseptal    myocardium. Hypokinesis of the anterior myocardium. Hypokinesis    of the apical myocardium.  - Mitral valve: There was mild regurgitation.  - Right ventricle: Systolic function was normal.  - Pulmonary arteries: Systolic pressure was within the normal    range.   Impressions:   - high risk of LV mural thrombus given large regions of severe     hypokinesis (possibly akinesis).    Laboratory Data:  Chemistry Recent Labs  Lab 02/20/24 0751  NA 129*  K 5.3*  CL 90*  CO2 25  GLUCOSE 110*  BUN 49*  CREATININE 2.22*  CALCIUM  7.5*  GFRNONAA 31*  ANIONGAP 14    Recent Labs  Lab 02/20/24 0751  PROT 5.7*  ALBUMIN 2.7*  AST 3,301*  ALT 2,038*  ALKPHOS 134*  BILITOT 5.5*   Hematology Recent Labs  Lab 02/20/24 0751  WBC 14.7*  RBC 3.64*  HGB 11.9*  HCT 37.3*  MCV 102.5*  MCH 32.7  MCHC 31.9  RDW 14.2  PLT 355   Cardiac EnzymesNo results for input(s): TROPONINI in the last 168 hours. No results for input(s): TROPIPOC in the last 168 hours.  BNP Recent Labs  Lab 02/20/24 0753  BNP 819.5*    DDimer No results for input(s): DDIMER in the last 168 hours.  Radiology/Studies:   [facility] Chest Port 1 View Result Date: 02/20/2024 IMPRESSION: 1. Left IJ approach central venous catheter terminates in the region of the brachiocephalic vein confluence/upper SVC. No pneumothorax. 2. Little significant interval change to the lungs. Similarly positioned endotracheal tube. Electronically Signed   By: Rogelia Myers M.D.   On: 02/20/2024 11:49   CT Head Wo Contrast Result Date: 02/20/2024 IMPRESSION: 1. There is of focal area of abnormal density within the left frontal lobe, which could represent a subacute cortical infarct. Correlation with MRI of the brain is suggested. 2. Interval development of a focal area of diminished attenuation within the subcortical white matter of the right occipital lobe, possibly representing a chronic subcortical infarct. This also could be better assessed with MRI. Electronically Signed   By: Evalene Coho M.D.   On: 02/20/2024 10:19   DG Abdomen 1 View Result Date: 02/20/2024 IMPRESSION: Nasogastric tube tip seen in expected position of proximal stomach. Electronically Signed   By: Lynwood Landy Raddle M.D.   On: 02/20/2024 09:45   CT Angio Chest PE W and/or Wo Contrast Result Date:  02/20/2024 IMPRESSION: CTA CHEST: 1. No evidence of  acute pulmonary embolism. 2. Rounded filling defect within the right atrial appendage, suspicious for thrombus. 3. Multi lobar pulmonary findings, likely a combination of atelectasis, pulmonary edema, and aspiration. 4. Moderate right and small left pleural effusions. 5. Reflux of contrast material into the hepatic veins, suggesting a degree of right heart dysfunction. 6. Multiple bilateral anterior rib fractures. 7. CT ABDOMEN AND PELVIS: 1. Mild diffuse mural thickening, which may be seen in the setting of hypoperfusion. 2. Small volume free fluid. 3.  Aortic Atherosclerosis (ICD10-I70.0). Electronically Signed   By: Limin  Xu M.D.   On: 02/20/2024 09:35   CT ABDOMEN PELVIS W CONTRAST Result Date: 02/20/2024 IMPRESSION: CTA CHEST: 1. No evidence of acute pulmonary embolism. 2. Rounded filling defect within the right atrial appendage, suspicious for thrombus. 3. Multi lobar pulmonary findings, likely a combination of atelectasis, pulmonary edema, and aspiration. 4. Moderate right and small left pleural effusions. 5. Reflux of contrast material into the hepatic veins, suggesting a degree of right heart dysfunction. 6. Multiple bilateral anterior rib fractures. 7. CT ABDOMEN AND PELVIS: 1. Mild diffuse mural thickening, which may be seen in the setting of hypoperfusion. 2. Small volume free fluid. 3.  Aortic Atherosclerosis (ICD10-I70.0). Electronically Signed   By: Limin  Xu M.D.   On: 02/20/2024 09:35   DG Chest Portable 1 View Result Date: 02/20/2024 IMPRESSION: Limited study due to artifact. Diffuse interstitial prominence. Tubes and lines as above. Electronically Signed   By: Fonda Field M.D.   On: 02/20/2024 07:47    Assessment and Plan:   1. Out-of-hospital cardiac arrest with cardiogenic shock and severe biventricular failure with acute hypoxic respiratory failure, multiorgan failure with AKI likely related to ATN from cardiac arrest and  transaminitis likely related to shock liver, possible apical mural thrombus, and aspiration pneumonia:  - Uncertain downtime, status post 2 separate rounds of ACLS - Remains intubated and sedated on vasopressor support with norepinephrine  with ongoing management per CCM  - If there is meaningful neurologic recovery would recommend evaluation by advanced heart failure and possible cardiac cath pending trend of multiorgan failure - Patient would not currently be a candidate for advanced heart failure therapies such as ICD or LVAD given incarceration, polysubstance use, and medical nonadherence - Recommend trending hemoglobin (Hgb 11.9 in the ED with hemoglobin 6.9 on Coox), and PT/INR prior to starting heparin drip for possible apical mural thrombus that if present, is likely chronic when reviewing imaging dated back to 2019 - Await DIC panel - Unable to escalate GDMT at this time in the presence of cardiogenic shock requiring vasopressor support and multiorgan failure - Avoid nephrotoxic agents - Somewhat concerning that his heart rates are in the 60s bpm with severe and acute illness in the context of severe cardiomyopathy, though there may be some blunting of heart rate with the sedation - Overall, poor prognosis with recommendation to consult palliative care to discuss GOC with family       For questions or updates, please contact [facility] Please consult [email or web address] for contact info under Cardiology/STEMI.   Signed, Bernardino Bring, PA-C [facility] Pager: [phone] 02/20/2024, 1:15 PM

## 2024-02-20 NOTE — Progress Notes (Signed)
 Pharmacy Antibiotic Note  Miguel [name] is a 73 y.o. male w/ PMH of CHF, HTN, CVA, schizophrenia admitted on 02/20/2024 with cardiac arrest.  Pharmacy has been consulted for vancomycin and cefepime dosing. Serum creatinine is noted to be elevated well above most recent level (1.10 mg/dL 98/7975)  Plan:  1) start cefepime 2 grams IV every 12 hours  2) vancomycin 2000 mg IV x 1 ---will order a random vancomycin for am 06/27 to assess clearance ---vancomycin variable dose marker placed ---BMP in am to assess CrCl   Weight: 103.4 kg (228 lb)  Temp (24hrs), Avg:93.7 F (34.3 C), Min:93.7 F (34.3 C), Max:93.7 F (34.3 C)  Recent Labs  Lab 02/20/24 0751  CREATININE 2.22*  LATICACIDVEN 8.1*    CrCl cannot be calculated (Unknown ideal weight.).    No Known Allergies  Antimicrobials this admission: 06/26 vancomycin >>  06/26 cefepime >>   Microbiology results: 06/26 UCx: pending   Thank you for allowing pharmacy to be a part of this patient's care.  Adriana [name] Bolster 02/20/2024 9:41 AM

## 2024-02-20 NOTE — Progress Notes (Signed)
 RT assisted Dr. Isadora with bedside bronchoscopy. Pt Fio2 increased to 100% for procedure. Consent and time out were obtained and performed by appropriate personnel prior to start of procedure. Time scope in was 1233 and scope removed at 1236. Pt tolerated well with no complications. Fio2 returned to previous setting of 80% Disposable ambu bronchoscope used for procedure.

## 2024-02-20 NOTE — ED Notes (Signed)
 Warm blankets applied to pt. Will apply bair hugger on return from CT.

## 2024-02-20 NOTE — Progress Notes (Signed)
 Contacted pts sister Ernesto Zukowski via telephone now that Mr. Sabic has been released from police custody to inform her the pt is hospitalized. Also, discussed pts condition and current plan of care.  All questions were answered. I asked her to update the rest of pts family, and she stated she would.  She also informed me she will arrive at the hospital shortly to visit pt.    Lonell Moose, AGNP  Pulmonary/Critical Care Pager [phone] (please enter 7 digits) PCCM Consult Pager [phone] (please enter 7 digits)

## 2024-02-20 NOTE — ED Notes (Signed)
 OG advanced by ICU MD to 75 cm at the lip. Imaging confirmed at bedside with XR.

## 2024-02-20 NOTE — ED Notes (Signed)
 OG tube not secured on this RN's assessment, tube found lying beside pt. New OG replaced with auditory confirmation. Imaging ordered as well.

## 2024-02-20 NOTE — Sepsis Progress Note (Addendum)
 Code sepsis protocol being monitored by eLink. Pt arrived PEA arrest, was intubated and started on pressor. Has been a difficult stick for blood cultures. Lab is on way to assist.

## 2024-02-20 NOTE — ED Notes (Signed)
ICU NP and MD at bedside.

## 2024-02-20 NOTE — ED Provider Notes (Signed)
 Central Line  Date/Time: 02/20/2024 7:34 AM  Performed by: Malvina Alm DASEN, MD Authorized by: Malvina Alm DASEN, MD   Consent:    Consent obtained:  Emergent situation Pre-procedure details:    Indication(s): central venous access and insufficient peripheral access     All elements of maximal sterile barrier technique followed: Emergent crash central line on coding patient.     Skin preparation:  Chlorhexidine  with alcohol   Skin preparation agent: Skin preparation agent completely dried prior to procedure   Procedure details:    Location:  R femoral   Site selection rationale:  Actively in cardiac arrest   Procedural supplies:  Triple lumen   Ultrasound guidance: yes     Ultrasound guidance timing: real time     Number of attempts:  1   Successful placement: yes   Post-procedure details:    Post-procedure:  Dressing applied and line sutured   Assessment:  Blood return through all ports   Procedure completion:  [place] Malvina Alm DASEN, MD 02/20/24 [phone]

## 2024-02-21 DIAGNOSIS — R569 Unspecified convulsions: Secondary | ICD-10-CM

## 2024-02-21 DIAGNOSIS — J90 Pleural effusion, not elsewhere classified: Secondary | ICD-10-CM | POA: Diagnosis not present

## 2024-02-21 DIAGNOSIS — G9341 Metabolic encephalopathy: Secondary | ICD-10-CM | POA: Diagnosis not present

## 2024-02-21 DIAGNOSIS — I5023 Acute on chronic systolic (congestive) heart failure: Secondary | ICD-10-CM | POA: Diagnosis not present

## 2024-02-21 DIAGNOSIS — I469 Cardiac arrest, cause unspecified: Secondary | ICD-10-CM | POA: Diagnosis not present

## 2024-02-21 LAB — HEPATIC FUNCTION PANEL
ALT: 2111 U/L — ABNORMAL HIGH (ref 0–44)
AST: 2959 U/L — ABNORMAL HIGH (ref 15–41)
Albumin: 2.6 g/dL — ABNORMAL LOW (ref 3.5–5.0)
Alkaline Phosphatase: 119 U/L (ref 38–126)
Bilirubin, Direct: 2.4 mg/dL — ABNORMAL HIGH (ref 0.0–0.2)
Indirect Bilirubin: 2.5 mg/dL — ABNORMAL HIGH (ref 0.3–0.9)
Total Bilirubin: 4.9 mg/dL — ABNORMAL HIGH (ref 0.0–1.2)
Total Protein: 5.2 g/dL — ABNORMAL LOW (ref 6.5–8.1)

## 2024-02-21 LAB — PREPARE FRESH FROZEN PLASMA: Unit division: 0

## 2024-02-21 LAB — COOXEMETRY PANEL
Carboxyhemoglobin: 1.7 % — ABNORMAL HIGH (ref 0.5–1.5)
Carboxyhemoglobin: 1.2 % (ref 0.5–1.5)
Methemoglobin: <0.7 % (ref 0.0–1.5)
O2 Saturation: 89.3 %
O2 Saturation: 75.4 %
Total hemoglobin: 14.4 g/dL (ref 12.0–16.0)
Total oxygen content: 87.7 %
Total oxygen content: 74.4 %

## 2024-02-21 LAB — BPAM CRYOPRECIPITATE
Blood Product Expiration Date: 202506262120
ISSUE DATE / TIME: 202506261632
Unit Type and Rh: 6200

## 2024-02-21 LAB — HEPATITIS PANEL, ACUTE
HCV Ab: REACTIVE — AB
Hep A IgM: NONREACTIVE
Hepatitis B Surface Ag: NONREACTIVE

## 2024-02-21 LAB — URINE CULTURE: Culture: <10000 — AB

## 2024-02-21 LAB — FIBRINOGEN
Fibrinogen: 200 mg/dL — ABNORMAL LOW (ref 210–475)
Fibrinogen: 195 mg/dL — ABNORMAL LOW (ref 210–475)

## 2024-02-21 LAB — BASIC METABOLIC PANEL WITH GFR
Anion gap: 11 (ref 5–15)
BUN: 60 mg/dL — ABNORMAL HIGH (ref 8–23)
CO2: 26 mmol/L (ref 22–32)
Calcium: 7.7 mg/dL — ABNORMAL LOW (ref 8.9–10.3)
Chloride: 94 mmol/L — ABNORMAL LOW (ref 98–111)
Creatinine, Ser: 2.24 mg/dL — ABNORMAL HIGH (ref 0.61–1.24)
GFR, Estimated: 31 mL/min — ABNORMAL LOW (ref >60)
Glucose, Bld: 86 mg/dL (ref 70–99)
Potassium: 4.2 mmol/L (ref 3.5–5.1)
Sodium: 131 mmol/L — ABNORMAL LOW (ref 135–145)

## 2024-02-21 LAB — PROTIME-INR
INR: 2.5 — ABNORMAL HIGH (ref 0.8–1.2)
INR: 2.8 — ABNORMAL HIGH (ref 0.8–1.2)
Prothrombin Time: 28.5 s — ABNORMAL HIGH (ref 11.4–15.2)
Prothrombin Time: 30.7 s — ABNORMAL HIGH (ref 11.4–15.2)

## 2024-02-21 LAB — PHOSPHORUS: Phosphorus: 3.5 mg/dL (ref 2.5–4.6)

## 2024-02-21 LAB — BPAM FFP
Blood Product Expiration Date: 202507012359
ISSUE DATE / TIME: 202506261753
Unit Type and Rh: 7300

## 2024-02-21 LAB — CBC
HCT: 37.1 % — ABNORMAL LOW (ref 39.0–52.0)
Hemoglobin: 12.8 g/dL — ABNORMAL LOW (ref 13.0–17.0)
MCH: 32.2 pg (ref 26.0–34.0)
MCHC: 34.5 g/dL (ref 30.0–36.0)
MCV: 93.2 fL (ref 80.0–100.0)
Platelets: 311 10*3/uL (ref 150–400)
RBC: 3.98 MIL/uL — ABNORMAL LOW (ref 4.22–5.81)
RDW: 13.4 % (ref 11.5–15.5)
WBC: 19.3 10*3/uL — ABNORMAL HIGH (ref 4.0–10.5)
nRBC: 1 % — ABNORMAL HIGH (ref 0.0–0.2)

## 2024-02-21 LAB — PREPARE CRYOPRECIPITATE: Unit division: 0

## 2024-02-21 LAB — GLUCOSE, CAPILLARY
Glucose-Capillary: 93 mg/dL (ref 70–99)
Glucose-Capillary: 70 mg/dL (ref 70–99)
Glucose-Capillary: 108 mg/dL — ABNORMAL HIGH (ref 70–99)
Glucose-Capillary: 124 mg/dL — ABNORMAL HIGH (ref 70–99)
Glucose-Capillary: 106 mg/dL — ABNORMAL HIGH (ref 70–99)
Glucose-Capillary: 108 mg/dL — ABNORMAL HIGH (ref 70–99)
Glucose-Capillary: 110 mg/dL — ABNORMAL HIGH (ref 70–99)

## 2024-02-21 LAB — TROPONIN I (HIGH SENSITIVITY)
Troponin I (High Sensitivity): 2551 ng/L (ref <18)
Troponin I (High Sensitivity): 2304 ng/L (ref <18)

## 2024-02-21 LAB — LACTIC ACID, PLASMA
Lactic Acid, Venous: 4 mmol/L (ref 0.5–1.9)
Lactic Acid, Venous: 2.1 mmol/L (ref 0.5–1.9)

## 2024-02-21 LAB — TRIGLYCERIDES: Triglycerides: 53 mg/dL (ref <150)

## 2024-02-21 LAB — VANCOMYCIN, RANDOM: Vancomycin Rm: 16 ug/mL

## 2024-02-21 LAB — MAGNESIUM: Magnesium: 2.5 mg/dL — ABNORMAL HIGH (ref 1.7–2.4)

## 2024-02-21 MED ORDER — HEPARIN SODIUM (PORCINE) 5000 UNIT/ML IJ SOLN
5000.0000 [IU] | Freq: Three times a day (TID) | INTRAMUSCULAR | Status: DC
  Administered 2024-02-21 – 2024-02-23 (×6): 5000 [IU] via SUBCUTANEOUS
  Filled 2024-02-21 (×6): qty 1

## 2024-02-21 MED ORDER — PROSOURCE TF20 ENFIT COMPATIBL EN LIQD
60.0000 mL | Freq: Two times a day (BID) | ENTERAL | Status: DC
  Administered 2024-02-21 – 2024-02-24 (×6): 60 mL

## 2024-02-21 MED ORDER — VITAL 1.5 CAL PO LIQD
1000.0000 mL | ORAL | Status: DC
  Administered 2024-02-21 – 2024-02-23 (×3): 1000 mL

## 2024-02-21 NOTE — Progress Notes (Signed)
 PHARMACY CONSULT NOTE - FOLLOW UP  Pharmacy Consult for Electrolyte Monitoring and Replacement   Recent Labs: Potassium (mmol/L)  Date Value  02/21/2024 4.2  09/15/2012 4.4   Magnesium  (mg/dL)  Date Value  93/72/7974 2.5 (H)   Calcium  (mg/dL)  Date Value  93/72/7974 7.7 (L)   Calcium , Total (mg/dL)  Date Value  98/79/7985 9.4   Albumin (g/dL)  Date Value  93/72/7974 2.6 (L)  09/15/2012 4.2   Phosphorus (mg/dL)  Date Value  93/72/7974 3.5   Sodium (mmol/L)  Date Value  02/21/2024 131 (L)  09/15/2012 146 (H)     Assessment: 73 y.o. male w/ PMH of CHF, HTN, CVA, schizophrenia admitted on 02/20/2024 with cardiac arrest. Pharmacy is asked to follow and replace electrolytes while in CCU   Goal of Therapy:  Electrolytes WNL  Plan:  ---no electrolyte replacement warranted for today ---repeat BMP at 1800 and again in am  Adriana [name] Bolster ,PharmD Clinical Pharmacist 02/21/2024 9:08 AM

## 2024-02-21 NOTE — Plan of Care (Signed)
  Problem: Pain Managment: Goal: General experience of comfort will improve and/or be controlled Outcome: Progressing   Problem: Safety: Goal: Ability to remain free from injury will improve Outcome: Progressing

## 2024-02-21 NOTE — Progress Notes (Signed)
 Nutrition Follow-up  DOCUMENTATION CODES:   Obesity unspecified, Non-severe (moderate) malnutrition in context of social or environmental circumstances  INTERVENTION:   -TF via OGT:    Initiate Vital 1.5 @ 20 ml/hr and increase by 10 ml every 8 hours to goal rate of 50 ml/hr.    60 ml Prosource TF20 BID   30 ml free water  flush every 4 hours   Tube feeding regimen provides 1680 kcal (100% of needs), 125 grams of protein, and 733 ml of H2O.  Total free water : 913 ml daily   -MVI with minerals daily via tube -100 mg thiamine  daily x 7 days via tube -Monitor Mg, K, and Phos and replete as appropriate secondary to high refeeding risk   NUTRITION DIAGNOSIS:   Moderate Malnutrition related to social / environmental circumstances as evidenced by mild fat depletion, mild muscle depletion, edema.  Ongoing  GOAL:   Patient will meet greater than or equal to 90% of their needs  Progressing   MONITOR:   Vent status, TF tolerance  REASON FOR ASSESSMENT:   Consult Enteral/tube feeding initiation and management  ASSESSMENT:   Pt with history of CHF, stroke, hypertensive emergency, schizophrenia presenting for evaluation of cardiac arrest  6/26- intubated, OGT placed (KUB reveals tip of tube in stomach), s/p bronchoscopy, s/p EEG 6/27- EEG revealed severe to profound diffuse encephalopathy, no seizures or epileptiform discharges were seen  Patient is currently intubated on ventilator support. OGT currently connected to low, intermittent suction.  MV: 10.3 L/min Temp (24hrs), Avg:95.8 F (35.4 C), Min:93.4 F (34.1 C), Max:97.3 F (36.3 C)  Reviewed I/O's: +1.5 L x 24 hours  UOP: 835 ml x 24 hours  NGT output: 50 ml x 24 hours  MAP: 66  Per MD notes, CT head within normal, will await 72 hours prior to prognostication.   Per RN, TF were held yesterday per MD order. Received consult to start TF.   Noted propofol  d/c this morning.   Medications reviewed and include  colace, protonix , miralax , thiamine , and dobutamine .  Labs reviewed: Na: 131, Mg: 2.5, Phos and K WDL, CBGS: 70-104 (inpatient orders for glycemic control are none).     Diet Order:   Diet Order             Diet NPO time specified  Diet effective now                   EDUCATION NEEDS:   Not appropriate for education at this time  Skin:  Skin Assessment: Reviewed RN Assessment  Last BM:  Unknown  Height:   Ht Readings from Last 1 Encounters:  04/27/23 6' (1.829 m)    Weight:   Wt Readings from Last 1 Encounters:  02/21/24 98.2 kg    Ideal Body Weight:  80.9 kg  BMI:  Body mass index is 29.36 kg/m.  Estimated Nutritional Needs:   Kcal:  1878  Protein:  120-135 grams  Fluid:  1.8-2.0 L    Margery ORN, RD, LDN, CDCES Registered Dietitian III Certified Diabetes Care and Education Specialist If unable to reach this RD, please use RD Inpatient group chat on secure chat between hours of 8am-4 pm daily

## 2024-02-21 NOTE — Progress Notes (Signed)
 NAME:  Miguel [name], MRN:  [id number], DOB:  11-27-1950, LOS: 1 ADMISSION DATE:  02/20/2024, CHIEF COMPLAINT:  Cardiac Arrest   History of Present Illness:   This is a 73 yo male who presented to [facility] ER via EMS from jail on 06/26 post cardiac arrest.  Per ER notes pts cellmate heard the pt gurgling this morning.  He was found pulseless with initial cardiac rhythm asystole.  EMS administered 3 doses of epi/2 mg of narcan .  Following administration of narcan  ROSC achieved.  Estimated downtime 15 minutes.  EMS reported en route to the ER pt became agitated and attempted to remove Igel.  EMS administered versed.  Pt became pulseless again en route ACLS protocol initiated.   ED Course  Upon arrival to the ER pt remained pulseless in PEA.  ACLS protocol continued pt received: 2 mg of epi/4 mg of iv narcan /1 amp of bicarb/1 g of calcium .  Igel exchanged for an ETT with ongoing CPR (pt did not require RSI medications for intubation).  ROSC achieved with estimated downtime during 2nd cardiac arrest 15 minutes.  CTA Chest negative for PE.  Significant lab results were: Na+ 129/K+ 5.3/chloride 90/glucose 110/BUN 49/creatinine 2.22/calcium  7.5/mag 3.2/alk phos 134/AST 3,301/ALT 2,038/total bilirubin 5.5/troponin 344/lactic acid 8.1/urine drug screen negative/UA concerning for possible UTI.  Sepsis protocol initiated pt to receive: cefepime /metronidazole /vancomycin .  Pt found to be severely hypoglycemic CBG 17 requiring 1 amp of D50W.  Levophed  gtt initiated due to hypotension.  PCCM team contacted for ICU admission.  Pertinent  Medical History  Schizophrenia  Depression Chronic Systolic CHF (Echo 09/11/22: EF <20%, grade II diastolic dysfunction, trivial pericardial effusion, mild mitral valve regurgitation, trivial tricuspid valve regurgitation, trivial pulmonic valve regurgitation) CVA with aphasia Tobacco Use Disorder  Significant Hospital Events: Including procedures, antibiotic start and stop dates  in addition to other pertinent events   02/20/24: admission following cardiac arrest. Intubated on arrival to ED, admit to ICU, on nor-epi and dobutamine  02/21/24: on minimal sedation, discontinued this AM. Off nor-epi, continued on dobutamine   Interim History / Subjective:  Unresponsive in bed, not in any distress  Objective    Blood pressure (!) 104/59, pulse 73, temperature (!) 96.8 F (36 C), temperature source (P) Bladder, resp. rate 10, weight 98.2 kg, SpO2 97%.    Vent Mode: PRVC FiO2 (%):  [50 %-80 %] 60 % Set Rate:  [15 bmp-20 bmp] 20 bmp Vt Set:  [450 mL-500 mL] 500 mL PEEP:  [8 cmH20] 8 cmH20 Plateau Pressure:  [21 cmH20-26 cmH20] 21 cmH20   Intake/Output Summary (Last 24 hours) at 02/21/2024 9178 Last data filed at 02/21/2024 0601 Gross per 24 hour  Intake 2395.74 ml  Output 885 ml  Net 1510.74 ml   Filed Weights   02/20/24 0720 02/21/24 0452  Weight: 103.4 kg 98.2 kg    Examination:  Physical Exam Constitutional:      General: He is not in acute distress.    Appearance: He is ill-appearing.   Cardiovascular:     Rate and Rhythm: Normal rate and regular rhythm.     Pulses: Normal pulses.     Heart sounds: Normal heart sounds.  Pulmonary:     Comments: Ventilated breath sounds bilaterally Abdominal:     General: There is distension.     Palpations: Abdomen is soft.   Musculoskeletal:     Right lower leg: Edema present.     Left lower leg: Edema present.   Neurological:     Mental  Status: He is disoriented.     Comments: Unresponsive. Pupils equal and reactive    Assessment and Plan   73 year old male with history of HFrEF who presented to the hospital from jail following a witnessed cardiac arrest. He underwent two rounds of ACLS (10 minutes in jail, 15 minutes in the ED) with ROSC, now intubated and mechanically ventilated and requiring propofol  for analgesia. POCUS showed severely depressed RV and LV function.   Neuro   #Toxic Metabolic  Encephalopathy #History of Schizophrenia #At risk for anoxic brain injury #Polysubstance Use Disorder #History of CVA  Propofol  goal RASS -1, fentanyl  for CPOT < 2. CT head within normal, will await 72 hours prior to prognostication. Plan for normothermia post cardiac arrest.  -d/c all sedation, assess mental status -EEG performed  CV #Cardiac Arrest  #PEA arrest #Cardiogenic Shock #Acute on Chronic HFrEF #Possible LV Thrombus  He has a history of HFrEF on previous echo, lost to follow up with cardiology. Now presents with cardiac arrest and profound shock with multi-organ failure. POCUS showed severely depressed LVEF; TTE showed LV thrombus. CVC placed, and co-ox was depressed. Initiated on dobutamine  for ionotropic support, currently running at 5 mcg/kg/min; will continue to trend central venous sat on co-ox. Lactic acid has improved significantly since admission, suggesting improvement in shock and response to ionotropic support. While there was concern for LV thrombus on TTE, he was in DIC and I am concerned that starting a heparin  gtt could result in significant bleeding.  -continue dobutamine  5 mcg/kg/min -trend central venous O2 sat  Pulm #Acute Hypoxic Respiratory Failure #Aspiration Pneumonia  respiratory failure secondary to cardiac arrest, with POCUS showing bilateral lower lobe consolidations and trace pleural effusions. This is consistent with aspiration secondary to the cardiac arrest. Mechanical ventilation initiated and he is requiring a significant amount of FiO2, likely due to aspiration of gastric content and bilateral lower lobe consolidations. Bronchoscopy was performed at the bedside yesterday for aspiration of secretions and noted to have gastric content in the airway. His compliance and O2 requirements are improved this AM.  -Full vent support, implement lung protective strategies -Plateau pressures less than 30 cm H20 -Wean FiO2 & PEEP as tolerated to maintain  O2 sats >92% -Spontaneous Breathing Trials when respiratory parameters met and mental status permits -Implement VAP Bundle  GI #Shock Liver  elevated LFT's due to shock liver. This is improved today. He is NPO and on PPI for SUP.  Renal #AKI #Metabolic Acidosis  AKI in the setting of cardiac arrest. Holding nephrotoxins, and will trend kidney function. No indication for sodium bicarbonate  infusion with pH > 7.2  Endo  ICU glycemic protocol, consider hydrocortisone if shock is persistent  Hem/Onc #DIC  heparin  gtt considered but ultimately not for LV thrombus given concern for DIC. He did receive cryo and FFP with mild improvement in fibrinogen . Will continue to trend daily. -hold heparin  gtt  ID  #Aspiration Pneumonia  broad spectrum antibiotics for aspiration pneumonia. check respiratory cultures. MRSA screen was negative.  -d/c vancomycin  -continue Cefepime   Best Practice (right click and Reselect all SmartList Selections daily)   Diet/type: NPO DVT prophylaxis prophylactic heparin   Pressure ulcer(s): N/A GI prophylaxis: PPI Lines: Central line and yes and it is still needed Foley:  Yes, and it is still needed Code Status:  full code Last date of multidisciplinary goals of care discussion [02/21/2024]  Labs   CBC: Recent Labs  Lab 02/20/24 0751 02/20/24 1223 02/20/24 2256 02/21/24 0420  WBC  14.7* 18.0* 17.7* 19.3*  NEUTROABS 12.7* 16.5* 15.9*  --   HGB 11.9* 12.8* 12.1* 12.8*  HCT 37.3* 39.7 36.2* 37.1*  MCV 102.5* 97.5 95.3 93.2  PLT 355 351 311 311    Basic Metabolic Panel: Recent Labs  Lab 02/20/24 0751 02/20/24 1223 02/20/24 1651 02/20/24 1745 02/21/24 0420  NA 129* 130*  --  129* 131*  K 5.3* 5.2*  --  5.0 4.2  CL 90* 89*  --  89* 94*  CO2 25 25  --  26 26  GLUCOSE 110* 125*  --  184* 86  BUN 49* 52*  --  55* 60*  CREATININE 2.22* 2.21*  --  2.33* 2.24*  CALCIUM  7.5* 8.0*  --  7.9* 7.7*  MG 3.2*  --   --   --  2.5*  PHOS  --  6.4*  5.6*  --  3.5   GFR: CrCl cannot be calculated (Unknown ideal weight.). Recent Labs  Lab 02/20/24 0751 02/20/24 1007 02/20/24 1223 02/20/24 1344 02/20/24 1651 02/20/24 2028 02/20/24 2256 02/21/24 0420  WBC 14.7*  --  18.0*  --   --   --  17.7* 19.3*  LATICACIDVEN 8.1*   < > 5.7*   < > 5.3* 5.7* 4.0* 2.1*   < > = values in this interval not displayed.    Liver Function Tests: Recent Labs  Lab 02/20/24 0751 02/21/24 0420  AST 3,301* 2,959*  ALT 2,038* 2,111*  ALKPHOS 134* 119  BILITOT 5.5* 4.9*  PROT 5.7* 5.2*  ALBUMIN 2.7* 2.6*   No results for input(s): LIPASE, AMYLASE in the last 168 hours. No results for input(s): AMMONIA in the last 168 hours.  ABG    Component Value Date/Time   PHART 7.35 02/20/2024 1452   PCO2ART 45 02/20/2024 1452   PO2ART 59 (L) 02/20/2024 1452   HCO3 24.8 02/20/2024 1452   ACIDBASEDEF 1.1 02/20/2024 1452   O2SAT 89.3 02/21/2024 0500     Coagulation Profile: Recent Labs  Lab 02/20/24 1344 02/20/24 2256 02/21/24 0420  INR 3.4* 2.5* 2.8*    Cardiac Enzymes: No results for input(s): CKTOTAL, CKMB, CKMBINDEX, TROPONINI in the last 168 hours.  HbA1C: Hgb A1c MFr Bld  Date/Time Value Ref Range Status  07/14/2018 04:29 AM 5.8 (H) 4.8 - 5.6 % Final    Comment:    (NOTE) Pre diabetes:          5.7%-6.4% Diabetes:              >6.4% Glycemic control for   <7.0% adults with diabetes   07/13/2018 04:25 AM 6.2 (H) 4.8 - 5.6 % Final    Comment:    (NOTE) Pre diabetes:          5.7%-6.4% Diabetes:              >6.4% Glycemic control for   <7.0% adults with diabetes     CBG: Recent Labs  Lab 02/20/24 1657 02/20/24 2101 02/20/24 2327 02/21/24 0434 02/21/24 0746  GLUCAP 165* 135* 104* 93 70    Review of Systems:   N/A  Past Medical History:  He,  has a past medical history of Biventricular failure (HCC), CVA (cerebral vascular accident) (HCC), Polysubstance use disorder, and Schizophrenia (HCC).    Surgical History:   Past Surgical History:  Procedure Laterality Date   APPENDECTOMY       Social History:   reports that he has never smoked. He has never used smokeless tobacco. He reports that  he does not drink alcohol and does not use drugs.   Family History:  His Family history is unknown by patient.   Allergies No Known Allergies   Home Medications  Prior to Admission medications   Medication Sig Start Date End Date Taking? Authorizing Provider  ARISTADA 882 MG/3.2ML prefilled syringe Inject 882 mg into the muscle every 30 (thirty) days. 08/23/22  Yes [provider]  atorvastatin  (LIPITOR) 40 MG tablet Take 1 tablet (40 mg total) by mouth daily at 6 PM. 07/15/18  Yes Laurence Bridegroom, MD  furosemide  (LASIX ) 40 MG tablet Take 1 tablet (40 mg total) by mouth daily. 06/19/22 02/20/24 Yes Arlander Charleston, MD  losartan (COZAAR) 25 MG tablet Take 25 mg by mouth daily. 02/10/24  Yes [provider]     Critical care time: 48 minutes    Belva November, MD [facility] 02/21/2024 9:25 AM

## 2024-02-21 NOTE — Plan of Care (Addendum)
 Tube feeds to be increased to 40ml/hr at 09 pm.  Problem: Education: Goal: Knowledge of General Education information will improve Description: Including pain rating scale, medication(s)/side effects and non-pharmacologic comfort measures Outcome: Progressing   Problem: Health Behavior/Discharge Planning: Goal: Ability to manage health-related needs will improve Outcome: Progressing   Problem: Clinical Measurements: Goal: Ability to maintain clinical measurements within normal limits will improve Outcome: Progressing Goal: Will remain free from infection Outcome: Progressing Goal: Diagnostic test results will improve Outcome: Progressing Goal: Respiratory complications will improve Outcome: Progressing Goal: Cardiovascular complication will be avoided Outcome: Progressing   Problem: Activity: Goal: Risk for activity intolerance will decrease Outcome: Progressing   Problem: Nutrition: Goal: Adequate nutrition will be maintained Outcome: Progressing   Problem: Coping: Goal: Level of anxiety will decrease Outcome: Progressing   Problem: Elimination: Goal: Will not experience complications related to bowel motility Outcome: Progressing Goal: Will not experience complications related to urinary retention Outcome: Progressing   Problem: Pain Managment: Goal: General experience of comfort will improve and/or be controlled Outcome: Progressing   Problem: Safety: Goal: Ability to remain free from injury will improve Outcome: Progressing   Problem: Skin Integrity: Goal: Risk for impaired skin integrity will decrease Outcome: Progressing   Problem: Education: Goal: Ability to manage disease process will improve Outcome: Progressing   Problem: Cardiac: Goal: Ability to achieve and maintain adequate cardiopulmonary perfusion will improve Outcome: Progressing   Problem: Neurologic: Goal: Promote progressive neurologic recovery Outcome: Progressing   Problem: Skin  Integrity: Goal: Risk for impaired skin integrity will be minimized. Outcome: Progressing

## 2024-02-21 NOTE — Procedures (Signed)
 Patient Name: Miguel [name]  MRN: [id number]  Epilepsy Attending: Arlin MALVA Krebs  Referring Physician/Provider: Maranda Lonell MATSU, NP  Date: 02/20/2024 Duration: 28.30 mins  Patient history: 73yo M s/p cardiac arrest. EEG to evaluate for seizure.  Level of alertness: comatose  AEDs during EEG study: Propofol   Technical aspects: This EEG study was done with scalp electrodes positioned according to the 10-20 International system of electrode placement. Electrical activity was reviewed with band pass filter of 1-70Hz , sensitivity of 7 uV/mm, display speed of 60mm/sec with a 60Hz  notched filter applied as appropriate. EEG data were recorded continuously and digitally stored.  Video monitoring was available and reviewed as appropriate.  Description: EEG showed continuous generalized background attenuation. Hyperventilation and photic stimulation were not performed.     ABNORMALITY -Background attenuation, generalized  IMPRESSION: This study is suggestive of severe to profound diffuse encephalopathy. No seizures or epileptiform discharges were seen throughout the recording.  Miguel [name]

## 2024-02-21 NOTE — Progress Notes (Signed)
 Progress Note  Patient Name: Miguel [name] Date of Encounter: 02/21/2024  Primary Cardiologist: New - consult by [facility]  Subjective   Remains intubated, sedated, and on dobutamine . Levophed  stopped. Troponin peaked at 2551. Family at bedside. CCM planning to wean sedation today to assess neurologic status.   Inpatient Medications    Scheduled Meds:  sodium chloride    Intravenous Once   Chlorhexidine  Gluconate Cloth  6 each Topical Daily   docusate  100 mg Per Tube BID   feeding supplement (PROSource TF20)  60 mL Per Tube TID   free water   30 mL Per Tube Q4H   multivitamin with minerals  1 tablet Per Tube Daily   mouth rinse  15 mL Mouth Rinse Q2H   pantoprazole  (PROTONIX ) IV  40 mg Intravenous QHS   polyethylene glycol  17 g Per Tube Daily   sodium chloride  flush  10-40 mL Intracatheter Q12H   thiamine  (VITAMIN B1) injection  100 mg Intravenous Daily   vancomycin  variable dose per unstable renal function (pharmacist dosing)   Does not apply See admin instructions   Continuous Infusions:  ceFEPime  (MAXIPIME ) IV Stopped (02/21/24 0130)   DOBUTamine  5 mcg/kg/min (02/21/24 0612)   feeding supplement (VITAL 1.5 CAL)     norepinephrine  (LEVOPHED ) Adult infusion Stopped (02/20/24 1758)   propofol  (DIPRIVAN ) infusion 10 mcg/kg/min (02/21/24 0612)   PRN Meds: docusate sodium , fentaNYL  (SUBLIMAZE ) injection, fentaNYL  (SUBLIMAZE ) injection, mouth rinse, polyethylene glycol, sodium chloride  flush   Vital Signs    Vitals:   02/21/24 0515 02/21/24 0530 02/21/24 0545 02/21/24 0722  BP: 107/60 (!) 100/54 100/61   Pulse: 82 76 78   Resp: 11 (!) 7 (!) 0   Temp: (!) 96.8 F (36 C) (!) 96.6 F (35.9 C) (!) 96.6 F (35.9 C)   TempSrc:      SpO2: 96% 97% 98% 100%  Weight:        Intake/Output Summary (Last 24 hours) at 02/21/2024 0742 Last data filed at 02/21/2024 0601 Gross per 24 hour  Intake 2395.74 ml  Output 885 ml  Net 1510.74 ml   Filed Weights   02/20/24 0720  02/21/24 0452  Weight: 103.4 kg 98.2 kg    Telemetry    SR with rare isolated PVCs - Personally Reviewed  ECG    No new tracings - Personally Reviewed  Physical Exam   GEN: Acute on chronically ill-appearing; No acute distress.   Neck: JVD difficult to assess secondary to respiratory support apparatus. Cardiac: RRR, no murmurs, rubs, or gallops.  Respiratory: Diminished and vented breath sounds bilaterally.  GI: Soft, mildly distended.   MS: Chronic woody edema to the bilateral shins; No deformity. Neuro:  Intubated and sedated.  Psych: Intubated and sedated.  Labs    Chemistry Recent Labs  Lab 02/20/24 0751 02/20/24 1223 02/20/24 1745 02/21/24 0420  NA 129* 130* 129* 131*  K 5.3* 5.2* 5.0 4.2  CL 90* 89* 89* 94*  CO2 25 25 26 26   GLUCOSE 110* 125* 184* 86  BUN 49* 52* 55* 60*  CREATININE 2.22* 2.21* 2.33* 2.24*  CALCIUM  7.5* 8.0* 7.9* 7.7*  PROT 5.7*  --   --  5.2*  ALBUMIN 2.7*  --   --  2.6*  AST 3,301*  --   --  2,959*  ALT 2,038*  --   --  2,111*  ALKPHOS 134*  --   --  119  BILITOT 5.5*  --   --  4.9*  GFRNONAA 31* 31*  29* 31*  ANIONGAP 14 16* 14 11     Hematology Recent Labs  Lab 02/20/24 1223 02/20/24 2256 02/21/24 0420  WBC 18.0* 17.7* 19.3*  RBC 4.07* 3.80* 3.98*  HGB 12.8* 12.1* 12.8*  HCT 39.7 36.2* 37.1*  MCV 97.5 95.3 93.2  MCH 31.4 31.8 32.2  MCHC 32.2 33.4 34.5  RDW 14.2 13.7 13.4  PLT 351 311 311    Cardiac EnzymesNo results for input(s): TROPONINI in the last 168 hours. No results for input(s): TROPIPOC in the last 168 hours.   BNP Recent Labs  Lab 02/20/24 0753  BNP 819.5*     DDimer No results for input(s): DDIMER in the last 168 hours.   Radiology    DG Abd 1 View Result Date: 02/21/2024 IMPRESSION: 1. Interval advancement of nasogastric tube, now in appropriate position. Electronically Signed   By: Dorethia Molt M.D.   On: 02/21/2024 00:03   DG Abd 1 View Result Date: 02/21/2024 IMPRESSION: 1. Orogastric  tube tip within the proximal body of the stomach. Advancement of the catheter by 15 cm is recommended for more optimal positioning. Electronically Signed   By: Dorethia Molt M.D.   On: 02/21/2024 00:02   DG Chest Port 1 View Result Date: 02/20/2024 IMPRESSION: 1. Left IJ approach central venous catheter terminates in the region of the brachiocephalic vein confluence/upper SVC. No pneumothorax. 2. Little significant interval change to the lungs. Similarly positioned endotracheal tube. Electronically Signed   By: Rogelia Myers M.D.   On: 02/20/2024 11:49   CT Head Wo Contrast Result Date: 02/20/2024 IMPRESSION: 1. There is of focal area of abnormal density within the left frontal lobe, which could represent a subacute cortical infarct. Correlation with MRI of the brain is suggested. 2. Interval development of a focal area of diminished attenuation within the subcortical white matter of the right occipital lobe, possibly representing a chronic subcortical infarct. This also could be better assessed with MRI. Electronically Signed   By: Evalene Coho M.D.   On: 02/20/2024 10:19   DG Abdomen 1 View Result Date: 02/20/2024 IMPRESSION: Nasogastric tube tip seen in expected position of proximal stomach. Electronically Signed   By: Lynwood Landy Raddle M.D.   On: 02/20/2024 09:45   CT Angio Chest PE W and/or Wo Contrast Result Date: 02/20/2024 IMPRESSION: CTA CHEST: 1. No evidence of acute pulmonary embolism. 2. Rounded filling defect within the right atrial appendage, suspicious for thrombus. 3. Multi lobar pulmonary findings, likely a combination of atelectasis, pulmonary edema, and aspiration. 4. Moderate right and small left pleural effusions. 5. Reflux of contrast material into the hepatic veins, suggesting a degree of right heart dysfunction. 6. Multiple bilateral anterior rib fractures. 7. CT ABDOMEN AND PELVIS: 1. Mild diffuse mural thickening, which may be seen in the setting of hypoperfusion. 2. Small  volume free fluid. 3.  Aortic Atherosclerosis (ICD10-I70.0). Electronically Signed   By: Limin  Xu M.D.   On: 02/20/2024 09:35   CT ABDOMEN PELVIS W CONTRAST Result Date: 02/20/2024 IMPRESSION: CTA CHEST: 1. No evidence of acute pulmonary embolism. 2. Rounded filling defect within the right atrial appendage, suspicious for thrombus. 3. Multi lobar pulmonary findings, likely a combination of atelectasis, pulmonary edema, and aspiration. 4. Moderate right and small left pleural effusions. 5. Reflux of contrast material into the hepatic veins, suggesting a degree of right heart dysfunction. 6. Multiple bilateral anterior rib fractures. 7. CT ABDOMEN AND PELVIS: 1. Mild diffuse mural thickening, which may be seen in the setting  of hypoperfusion. 2. Small volume free fluid. 3.  Aortic Atherosclerosis (ICD10-I70.0). Electronically Signed   By: Limin  Xu M.D.   On: 02/20/2024 09:35   DG Chest Portable 1 View Result Date: 02/20/2024 IMPRESSION: Limited study due to artifact. Diffuse interstitial prominence. Tubes and lines as above. Electronically Signed   By: Fonda Field M.D.   On: 02/20/2024 07:47    Cardiac Studies   2D echo 02/20/2024: 1. Left ventricular ejection fraction, by estimation, is 20 to 25%. Left  ventricular ejection fraction by 2D MOD biplane is 26.0 %. The left  ventricle has severely decreased function. The left ventricle demonstrates  global hypokinesis. Left ventricular  diastolic parameters are indeterminate.   2. Definity  images detailing very slow flow in the periapical region,  possible mural thrombus in the apical region.   3. Right ventricular systolic function is severely reduced. The right  ventricular size is moderately enlarged. There is normal pulmonary artery  systolic pressure. The estimated right ventricular systolic pressure is  15.0 mmHg.   4. The mitral valve is normal in structure. No evidence of mitral valve  regurgitation. No evidence of mitral stenosis.    5. The aortic valve has an indeterminant number of cusps. Aortic valve  regurgitation is not visualized. Aortic valve sclerosis is present, with  no evidence of aortic valve stenosis.   6. The inferior vena cava is dilated in size with >50% respiratory  variability, suggesting right atrial pressure of 8 mmHg.  __________   2D echo 09/11/2022: 1. Left ventricular ejection fraction, by estimation, is <20%. The left  ventricle has severely decreased function. The left ventricle demonstrates  global hypokinesis. There is mild left ventricular hypertrophy. Left  ventricular diastolic parameters are  consistent with Grade II diastolic dysfunction (pseudonormalization).  Elevated left atrial pressure.   2. Right ventricular systolic function is severely reduced. The right  ventricular size is moderately enlarged.   3. Right atrial size was mildly dilated.   4. Moderate pleural effusion in the left lateral region.   5. The mitral valve is abnormal. Mild mitral valve regurgitation.   6. The aortic valve is tricuspid. There is moderate thickening of the  aortic valve. Aortic valve regurgitation is not visualized. Aortic valve  sclerosis is present, with no evidence of aortic valve stenosis.  __________   2D echo 07/13/2018: - Left ventricle: The cavity size was mildly dilated. There was    mild concentric hypertrophy. Systolic function was severely    reduced. The estimated ejection fraction was in the range of 25%    to 30%. Diffuse hypokinesis. Hypokinesis of the anteroseptal    myocardium. Hypokinesis of the anterior myocardium. Hypokinesis    of the apical myocardium.  - Mitral valve: There was mild regurgitation.  - Right ventricle: Systolic function was normal.  - Pulmonary arteries: Systolic pressure was within the normal    range.   Impressions:   - high risk of LV mural thrombus given large regions of severe    hypokinesis (possibly akinesis).   Patient Profile     73 y.o.  male with history of biventricular failure, schizoaffective disorder, prior cocaine use, and CVA in 2019 who is being seen today for the evaluation of out-of-hospital cardiac arrest at the request of Dr. Isadora, MD.   Assessment & Plan    1. Out-of-hospital cardiac arrest with cardiogenic shock and severe biventricular failure with acute hypoxic respiratory failure, multiorgan failure with AKI likely related to ATN from cardiac arrest and  transaminitis likely related to shock liver, possible apical mural thrombus, and aspiration pneumonia:  - Uncertain downtime, status post 2 separate rounds of ACLS -Troponin peaked at 2551, not a candidate for emergent LHC at presentation given multiorgan failure and uncertain downtime  - Remains intubated and sedated on inotropic support with dobutamine , now off Levophed  - CCM weaning sedation today - If there is meaningful neurologic recovery would recommend evaluation by advanced heart failure and possible cardiac cath pending trend of multiorgan failure - Patient would not currently be a candidate for advanced heart failure therapies such as ICD or LVAD given incarceration, polysubstance use, and medical nonadherence - Heparin  gtt has been deferred for possible mural thrombus in the setting of shock liver with elevated INR - Unable to escalate GDMT at this time in the presence of cardiogenic shock requiring inotropic support and multiorgan failure - Avoid nephrotoxic agents - Renal and kidney function improving - Overall, poor prognosis with recommendation to consult palliative care to discuss GOC with family       For questions or updates, please contact [facility] Please consult [email or web address] for contact info under Cardiology/STEMI.    Signed, Bernardino Bring, PA-C [facility] Pager: [phone] 02/21/2024, 7:42 AM

## 2024-02-22 DIAGNOSIS — I5023 Acute on chronic systolic (congestive) heart failure: Secondary | ICD-10-CM | POA: Diagnosis not present

## 2024-02-22 DIAGNOSIS — G9341 Metabolic encephalopathy: Secondary | ICD-10-CM | POA: Diagnosis not present

## 2024-02-22 DIAGNOSIS — I469 Cardiac arrest, cause unspecified: Secondary | ICD-10-CM | POA: Diagnosis not present

## 2024-02-22 DIAGNOSIS — J90 Pleural effusion, not elsewhere classified: Secondary | ICD-10-CM | POA: Diagnosis not present

## 2024-02-22 LAB — LACTIC ACID, PLASMA: Lactic Acid, Venous: 1.3 mmol/L (ref 0.5–1.9)

## 2024-02-22 LAB — CBC
HCT: 37.4 % — ABNORMAL LOW (ref 39.0–52.0)
Hemoglobin: 12.9 g/dL — ABNORMAL LOW (ref 13.0–17.0)
MCH: 31.7 pg (ref 26.0–34.0)
MCHC: 34.5 g/dL (ref 30.0–36.0)
MCV: 91.9 fL (ref 80.0–100.0)
Platelets: 286 10*3/uL (ref 150–400)
RBC: 4.07 MIL/uL — ABNORMAL LOW (ref 4.22–5.81)
RDW: 13.5 % (ref 11.5–15.5)
WBC: 20.2 10*3/uL — ABNORMAL HIGH (ref 4.0–10.5)
nRBC: 0.5 % — ABNORMAL HIGH (ref 0.0–0.2)

## 2024-02-22 LAB — COOXEMETRY PANEL
Carboxyhemoglobin: 1.5 % (ref 0.5–1.5)
Carboxyhemoglobin: 1.7 % — ABNORMAL HIGH (ref 0.5–1.5)
Methemoglobin: <0.7 % (ref 0.0–1.5)
Methemoglobin: <0.7 % (ref 0.0–1.5)
O2 Saturation: 78.3 %
O2 Saturation: 77.4 %
Total hemoglobin: 13.4 g/dL (ref 12.0–16.0)
Total hemoglobin: 14 g/dL (ref 12.0–16.0)
Total oxygen content: 76.7 %
Total oxygen content: 76.1 %

## 2024-02-22 LAB — GLUCOSE, CAPILLARY
Glucose-Capillary: 106 mg/dL — ABNORMAL HIGH (ref 70–99)
Glucose-Capillary: 114 mg/dL — ABNORMAL HIGH (ref 70–99)
Glucose-Capillary: 129 mg/dL — ABNORMAL HIGH (ref 70–99)
Glucose-Capillary: 116 mg/dL — ABNORMAL HIGH (ref 70–99)
Glucose-Capillary: 114 mg/dL — ABNORMAL HIGH (ref 70–99)
Glucose-Capillary: 70 mg/dL (ref 70–99)
Glucose-Capillary: 90 mg/dL (ref 70–99)

## 2024-02-22 LAB — RENAL FUNCTION PANEL
Albumin: 2.4 g/dL — ABNORMAL LOW (ref 3.5–5.0)
Anion gap: 10 (ref 5–15)
BUN: 68 mg/dL — ABNORMAL HIGH (ref 8–23)
CO2: 29 mmol/L (ref 22–32)
Calcium: 7.7 mg/dL — ABNORMAL LOW (ref 8.9–10.3)
Chloride: 94 mmol/L — ABNORMAL LOW (ref 98–111)
Creatinine, Ser: 1.77 mg/dL — ABNORMAL HIGH (ref 0.61–1.24)
GFR, Estimated: 41 mL/min — ABNORMAL LOW (ref >60)
Glucose, Bld: 115 mg/dL — ABNORMAL HIGH (ref 70–99)
Phosphorus: 3.4 mg/dL (ref 2.5–4.6)
Potassium: 4.1 mmol/L (ref 3.5–5.1)
Sodium: 133 mmol/L — ABNORMAL LOW (ref 135–145)

## 2024-02-22 LAB — MAGNESIUM: Magnesium: 2.7 mg/dL — ABNORMAL HIGH (ref 1.7–2.4)

## 2024-02-22 LAB — FIBRINOGEN: Fibrinogen: 361 mg/dL (ref 210–475)

## 2024-02-22 MED ORDER — SODIUM CHLORIDE 0.9 % IV SOLN
500.0000 mg | INTRAVENOUS | Status: DC
  Administered 2024-02-22 – 2024-02-23 (×2): 500 mg via INTRAVENOUS
  Filled 2024-02-22 (×3): qty 5

## 2024-02-22 MED ORDER — SODIUM CHLORIDE 0.9 % IV SOLN
2.0000 g | INTRAVENOUS | Status: AC
  Administered 2024-02-22 – 2024-02-27 (×6): 2 g via INTRAVENOUS
  Filled 2024-02-22 (×6): qty 20

## 2024-02-22 NOTE — Progress Notes (Addendum)
 PHARMACY CONSULT NOTE - FOLLOW UP  Pharmacy Consult for Electrolyte Monitoring and Replacement   Recent Labs: Potassium (mmol/L)  Date Value  02/22/2024 4.1  09/15/2012 4.4   Magnesium  (mg/dL)  Date Value  93/71/7974 2.7 (H)   Calcium  (mg/dL)  Date Value  93/71/7974 7.7 (L)   Calcium , Total (mg/dL)  Date Value  98/79/7985 9.4   Albumin (g/dL)  Date Value  93/71/7974 2.4 (L)  09/15/2012 4.2   Phosphorus (mg/dL)  Date Value  93/71/7974 3.4   Sodium (mmol/L)  Date Value  02/22/2024 133 (L)  09/15/2012 146 (H)     Assessment: 73 y.o. male w/ PMH of CHF, HTN, CVA, schizophrenia admitted on 02/20/2024 with cardiac arrest. Pharmacy is asked to follow and replace electrolytes while in CCU   Free water  30 ml q4H.   Goal of Therapy:  Electrolytes WNL  Plan:  No replacement needed. Electrolytes stable.  F/u with AM labs on 6/30   Cathaleen GORMAN Blanch ,PharmD Clinical Pharmacist 02/22/2024 8:33 AM

## 2024-02-22 NOTE — Plan of Care (Signed)
 The patient has been more active and restless since 0700 am this morning. The patient has been having spontaneous movement to his upper extremities and reacts to pain to his lower arms. This evening the patient started to show some signs of eye tracking. The patient opens his eyes spontaneously. Per Dr. Isadora RN to continue to monitor the patient and avoid using sedatives or narcotics at this time. Bed alarm in in place. Tube feeds are at goal at 82ml/hr. Dobutamine  has been decreased to 2.5 mcg/kg/min. Problem: Education: Goal: Knowledge of General Education information will improve Description: Including pain rating scale, medication(s)/side effects and non-pharmacologic comfort measures Outcome: Progressing   Problem: Health Behavior/Discharge Planning: Goal: Ability to manage health-related needs will improve Outcome: Progressing   Problem: Clinical Measurements: Goal: Ability to maintain clinical measurements within normal limits will improve Outcome: Progressing Goal: Will remain free from infection Outcome: Progressing Goal: Diagnostic test results will improve Outcome: Progressing Goal: Respiratory complications will improve Outcome: Progressing Goal: Cardiovascular complication will be avoided Outcome: Progressing   Problem: Activity: Goal: Risk for activity intolerance will decrease Outcome: Progressing   Problem: Nutrition: Goal: Adequate nutrition will be maintained Outcome: Progressing   Problem: Coping: Goal: Level of anxiety will decrease Outcome: Progressing   Problem: Elimination: Goal: Will not experience complications related to bowel motility Outcome: Progressing Goal: Will not experience complications related to urinary retention Outcome: Progressing   Problem: Pain Managment: Goal: General experience of comfort will improve and/or be controlled Outcome: Progressing   Problem: Safety: Goal: Ability to remain free from injury will improve Outcome:  Progressing   Problem: Skin Integrity: Goal: Risk for impaired skin integrity will decrease Outcome: Progressing   Problem: Education: Goal: Ability to manage disease process will improve Outcome: Progressing   Problem: Cardiac: Goal: Ability to achieve and maintain adequate cardiopulmonary perfusion will improve Outcome: Progressing   Problem: Neurologic: Goal: Promote progressive neurologic recovery Outcome: Progressing   Problem: Skin Integrity: Goal: Risk for impaired skin integrity will be minimized. Outcome: Progressing

## 2024-02-22 NOTE — Progress Notes (Signed)
 Rounding Note   Patient Name: Miguel [name] Date of Encounter: 02/22/2024  [facility] Cardiologist: None   Subjective Acute events overnight, patient appears to be moving upper limbs a little bit more today compared to yesterday.  Currently off sedation, still intubated.  Scheduled Meds:  Chlorhexidine  Gluconate Cloth  6 each Topical Daily   docusate  100 mg Per Tube BID   feeding supplement (PROSource TF20)  60 mL Per Tube BID   free water   30 mL Per Tube Q4H   heparin  injection (subcutaneous)  5,000 Units Subcutaneous Q8H   multivitamin with minerals  1 tablet Per Tube Daily   mouth rinse  15 mL Mouth Rinse Q2H   pantoprazole  (PROTONIX ) IV  40 mg Intravenous QHS   polyethylene glycol  17 g Per Tube Daily   sodium chloride  flush  10-40 mL Intracatheter Q12H   thiamine  (VITAMIN B1) injection  100 mg Intravenous Daily   Continuous Infusions:  azithromycin  250 mL/hr at 02/22/24 1204   cefTRIAXone (ROCEPHIN)  IV Stopped (02/22/24 1101)   DOBUTamine  2.5 mcg/kg/min (02/22/24 1204)   feeding supplement (VITAL 1.5 CAL) 30 mL/hr at 02/22/24 1204   norepinephrine  (LEVOPHED ) Adult infusion Stopped (02/20/24 1758)   PRN Meds: docusate sodium , mouth rinse, polyethylene glycol, sodium chloride  flush   Vital Signs  Vitals:   02/22/24 1130 02/22/24 1145 02/22/24 1150 02/22/24 1200  BP: (!) 105/59 (!) 87/50 107/72 107/67  Pulse: 93 94 94 95  Resp: (!) 24 20 (!) 21 16  Temp: 97.9 F (36.6 C) 97.9 F (36.6 C) 97.9 F (36.6 C) 98.1 F (36.7 C)  TempSrc:      SpO2: 98% 95% 94% 98%  Weight:      Height:        Intake/Output Summary (Last 24 hours) at 02/22/2024 1305 Last data filed at 02/22/2024 1204 Gross per 24 hour  Intake 1470.46 ml  Output 1295 ml  Net 175.46 ml      02/22/2024    4:38 AM 02/21/2024    4:52 AM 02/20/2024    7:20 AM  Last 3 Weights  Weight (lbs) 208 lb 15.9 oz 216 lb 7.9 oz 228 lb  Weight (kg) 94.8 kg 98.2 kg 103.42 kg       Telemetry Sinus rhythm- Personally Reviewed   Physical Exam GEN: No acute distress.  Intubated. Neck: No JVD Cardiac: RRR, no murmurs, rubs, or gallops.  Respiratory: Vented breath sounds GI: Soft, nontender, non-distended  MS: No edema; No deformity. Neuro: Intubated, unable to assess Psych: Unable to assess  Labs High Sensitivity Troponin:   Recent Labs  Lab 02/20/24 1007 02/20/24 1223 02/20/24 2106 02/20/24 2330 02/21/24 0420  TROPONINIHS 528* 939* 2,286* 2,551* 2,304*     Chemistry Recent Labs  Lab 02/20/24 0751 02/20/24 1223 02/20/24 1745 02/21/24 0420 02/22/24 0444  NA 129*   < > 129* 131* 133*  K 5.3*   < > 5.0 4.2 4.1  CL 90*   < > 89* 94* 94*  CO2 25   < > 26 26 29   GLUCOSE 110*   < > 184* 86 115*  BUN 49*   < > 55* 60* 68*  CREATININE 2.22*   < > 2.33* 2.24* 1.77*  CALCIUM  7.5*   < > 7.9* 7.7* 7.7*  MG 3.2*  --   --  2.5* 2.7*  PROT 5.7*  --   --  5.2*  --   ALBUMIN 2.7*  --   --  2.6*  2.4*  AST 3,301*  --   --  2,959*  --   ALT 2,038*  --   --  2,111*  --   ALKPHOS 134*  --   --  119  --   BILITOT 5.5*  --   --  4.9*  --   GFRNONAA 31*   < > 29* 31* 41*  ANIONGAP 14   < > 14 11 10    < > = values in this interval not displayed.    Lipids  Recent Labs  Lab 02/21/24 0420  TRIG 53    Hematology Recent Labs  Lab 02/20/24 2256 02/21/24 0420 02/22/24 0444  WBC 17.7* 19.3* 20.2*  RBC 3.80* 3.98* 4.07*  HGB 12.1* 12.8* 12.9*  HCT 36.2* 37.1* 37.4*  MCV 95.3 93.2 91.9  MCH 31.8 32.2 31.7  MCHC 33.4 34.5 34.5  RDW 13.7 13.4 13.5  PLT 311 311 286   Thyroid No results for input(s): TSH, FREET4 in the last 168 hours.  BNP Recent Labs  Lab 02/20/24 0753  BNP 819.5*    DDimer No results for input(s): DDIMER in the last 168 hours.   Radiology  EEG adult Result Date: 02/21/2024 Shelton Arlin KIDD, MD     02/21/2024  7:48 AM Patient Name: Miguel [name] MRN: [id number] Epilepsy Attending: Arlin KIDD Shelton Referring  Physician/Provider: Maranda Lonell MATSU, NP Date: 02/20/2024 Duration: 28.30 mins Patient history: 73yo M s/p cardiac arrest. EEG to evaluate for seizure. Level of alertness: comatose AEDs during EEG study: Propofol  Technical aspects: This EEG study was done with scalp electrodes positioned according to the 10-20 International system of electrode placement. Electrical activity was reviewed with band pass filter of 1-70Hz , sensitivity of 7 uV/mm, display speed of 106mm/sec with a 60Hz  notched filter applied as appropriate. EEG data were recorded continuously and digitally stored.  Video monitoring was available and reviewed as appropriate. Description: EEG showed continuous generalized background attenuation. Hyperventilation and photic stimulation were not performed.   ABNORMALITY -Background attenuation, generalized IMPRESSION: This study is suggestive of severe to profound diffuse encephalopathy. No seizures or epileptiform discharges were seen throughout the recording. Arlin KIDD Shelton   DG Abd 1 View Result Date: 02/21/2024 CLINICAL DATA:  Orogastric tube placement EXAM: ABDOMEN - 1 VIEW COMPARISON:  10:59 p.m. FINDINGS: Since the prior examination, the nasogastric tube is been advanced and its tip and proximal side hole are now position within the proximal body of the stomach. No other changes are seen. IMPRESSION: 1. Interval advancement of nasogastric tube, now in appropriate position. Electronically Signed   By: Dorethia Molt M.D.   On: 02/21/2024 00:03   DG Abd 1 View Result Date: 02/21/2024 CLINICAL DATA:  Orogastric tube placement EXAM: ABDOMEN - 1 VIEW COMPARISON:  None Available. FINDINGS: Orogastric tube tip overlies the proximal body of the stomach with the proximal side hole position within the distal esophagus. Normal abdominal gas pattern. Pelvis excluded from view. Small left pleural effusion. IMPRESSION: 1. Orogastric tube tip within the proximal body of the stomach. Advancement of the catheter  by 15 cm is recommended for more optimal positioning. Electronically Signed   By: Dorethia Molt M.D.   On: 02/21/2024 00:02    Cardiac Studies EF 20 to 25%  Patient Profile   73 y.o. 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  Assessment & Plan  Cardiac arrest - Likely VT/V-fib induced due to cardiomyopathy EF 20 to  25%. - Still intubated, continue supportive care. - On low-dose dobutamine  drip - Consider cardiac cath pending meaningful neurologic recovery. - Overall prognosis remains guarded    Signed, Redell Cave, MD  02/22/2024, 1:05 PM

## 2024-02-22 NOTE — Plan of Care (Signed)
  Problem: Pain Managment: Goal: General experience of comfort will improve and/or be controlled Outcome: Progressing   Problem: Safety: Goal: Ability to remain free from injury will improve Outcome: Progressing

## 2024-02-22 NOTE — Progress Notes (Signed)
 NAME:  Miguel [name], MRN:  [id number], DOB:  1951/03/03, LOS: 2 ADMISSION DATE:  02/20/2024, CHIEF COMPLAINT:  Cardiac Arrest   History of Present Illness:   This is a 73 yo male who presented to [facility] ER via EMS from jail on 06/26 post cardiac arrest.  Per ER notes pts cellmate heard the pt gurgling this morning.  He was found pulseless with initial cardiac rhythm asystole.  EMS administered 3 doses of epi/2 mg of narcan .  Following administration of narcan  ROSC achieved.  Estimated downtime 15 minutes.  EMS reported en route to the ER pt became agitated and attempted to remove Igel.  EMS administered versed.  Pt became pulseless again en route ACLS protocol initiated.   ED Course  Upon arrival to the ER pt remained pulseless in PEA.  ACLS protocol continued pt received: 2 mg of epi/4 mg of iv narcan /1 amp of bicarb/1 g of calcium .  Igel exchanged for an ETT with ongoing CPR (pt did not require RSI medications for intubation).  ROSC achieved with estimated downtime during 2nd cardiac arrest 15 minutes.  CTA Chest negative for PE.  Significant lab results were: Na+ 129/K+ 5.3/chloride 90/glucose 110/BUN 49/creatinine 2.22/calcium  7.5/mag 3.2/alk phos 134/AST 3,301/ALT 2,038/total bilirubin 5.5/troponin 344/lactic acid 8.1/urine drug screen negative/UA concerning for possible UTI.  Sepsis protocol initiated pt to receive: cefepime /metronidazole /vancomycin .  Pt found to be severely hypoglycemic CBG 17 requiring 1 amp of D50W.  Levophed  gtt initiated due to hypotension.  PCCM team contacted for ICU admission.  Pertinent  Medical History  Schizophrenia  Depression Chronic Systolic CHF (Echo 09/11/22: EF <20%, grade II diastolic dysfunction, trivial pericardial effusion, mild mitral valve regurgitation, trivial tricuspid valve regurgitation, trivial pulmonic valve regurgitation) CVA with aphasia Tobacco Use Disorder  Significant Hospital Events: Including procedures, antibiotic start and stop dates  in addition to other pertinent events   02/20/24: admission following cardiac arrest. Intubated on arrival to ED, admit to ICU, on nor-epi and dobutamine  02/21/24: on minimal sedation, discontinued this AM. Off nor-epi, continued on dobutamine  02/22/24: decreased dobutamine  to 2.5, remains off sedation  Interim History / Subjective:  Encephalopathic and minimally responsive. Withdraws to pain.  Objective    Blood pressure 110/82, pulse (!) 49, temperature 98.1 F (36.7 C), resp. rate 20, height 6' (1.829 m), weight 94.8 kg, SpO2 96%.    Vent Mode: PRVC FiO2 (%):  [40 %] 40 % Set Rate:  [20 bmp] 20 bmp Vt Set:  [500 mL] 500 mL PEEP:  [8 cmH20] 8 cmH20 Plateau Pressure:  [18 cmH20-23 cmH20] 18 cmH20   Intake/Output Summary (Last 24 hours) at 02/22/2024 0943 Last data filed at 02/22/2024 9078 Gross per 24 hour  Intake 1042.51 ml  Output 1295 ml  Net -252.49 ml   Filed Weights   02/20/24 0720 02/21/24 0452 02/22/24 0438  Weight: 103.4 kg 98.2 kg 94.8 kg    Examination:  Physical Exam Constitutional:      General: He is not in acute distress.    Appearance: He is ill-appearing.   Cardiovascular:     Rate and Rhythm: Normal rate and regular rhythm.     Pulses: Normal pulses.     Heart sounds: Normal heart sounds.  Pulmonary:     Comments: Ventilated breath sounds bilaterally Abdominal:     General: There is distension.     Palpations: Abdomen is soft.   Musculoskeletal:     Right lower leg: Edema present.     Left lower leg: Edema present.  Neurological:     Mental Status: He is disoriented.     Comments: He withdraws to pain, but does not open eyes. Pupils equal and reactive    Assessment and Plan   73 year old male with history of HFrEF who presented to the hospital from jail following a witnessed cardiac arrest. He underwent two rounds of ACLS (10 minutes in jail, 15 minutes in the ED) with ROSC, now intubated and mechanically ventilated and requiring propofol  for  analgesia. POCUS showed severely depressed RV and LV function.   Neuro   #Toxic Metabolic Encephalopathy #History of Schizophrenia #At risk for anoxic brain injury #Polysubstance Use Disorder #History of CVA  Propofol  goal RASS -1, fentanyl  for CPOT < 2 but he hasn't required any medications for sedation or analgesia. He is disoriented, but with mild improvement in mental status today given he is withdrawing to pain. CT head within normal, will await full 72 hours prior to prognostication. Plan for normothermia post cardiac arrest.  -d/c all sedation, assess mental status -EEG performed, encephalopathy  CV #Cardiac Arrest  #PEA arrest #Cardiogenic Shock #Acute on Chronic HFrEF #Possible LV Thrombus  He has a history of HFrEF on previous echo, lost to follow up with cardiology. Now presents with cardiac arrest and profound shock with multi-organ failure. POCUS showed severely depressed LVEF; TTE showed LV thrombus. CVC placed, and co-ox was depressed. Initiated on dobutamine  for ionotropic support, with improvement in central venous saturation. Will bring down dobutamine  to 2.5 mcg/kg/min and continue to trend central venus saturation on co-ox. Lactic acid has improved significantly since admission. While there was concern for LV thrombus on TTE, he was in DIC and I am concerned that starting a heparin  gtt could result in significant bleeding.  -continue dobutamine  2.5 mcg/kg/min -trend central venous O2 sat  Pulm #Acute Hypoxic Respiratory Failure #Aspiration Pneumonia  respiratory failure secondary to cardiac arrest, with POCUS showing bilateral lower lobe consolidations and trace pleural effusions. This is consistent with aspiration secondary to the cardiac arrest. Mechanical ventilation initiated and he is requiring a significant amount of FiO2, likely due to aspiration of gastric content and bilateral lower lobe consolidations. Bronchoscopy was performed at the bedside on admission  for aspiration of secretions and noted to have gastric content in the airway. His compliance and O2 requirements are improved this AM.  -Full vent support, implement lung protective strategies -Plateau pressures less than 30 cm H20 -Wean FiO2 & PEEP as tolerated to maintain O2 sats >92% -Spontaneous Breathing Trials when respiratory parameters met and mental status permits -Implement VAP Bundle -antibiotics per ID section  GI #Shock Liver  elevated LFT's due to shock liver. This is improved. On PPI for SUP. Tube feeds started.  Renal #AKI #Metabolic Acidosis  AKI in the setting of cardiac arrest which is improving on repeat labs today. Holding nephrotoxins, and will trend kidney function. No indication for sodium bicarbonate  infusion with pH > 7.2  Endo  ICU glycemic protocol, consider hydrocortisone if shock is persistent  Hem/Onc #DIC  heparin  gtt considered but ultimately not for LV thrombus given concern for DIC. He did receive cryo and FFP with mild improvement in fibrinogen . Will continue to trend daily.  -hold heparin  gtt -recheck fibrinogen  level  ID  #Aspiration Pneumonia  broad spectrum antibiotics for aspiration pneumonia. MRSA screen was negative. Respiratory cultures are growing H. Influenza and S. Pneumo, will transition antibiotics to Ceftriaxone and Azithromycin   -d/c vancomycin , d/c cefepime  -start CTX and Azithromycin   Best Practice (right  click and Reselect all SmartList Selections daily)   Diet/type: tubefeeds DVT prophylaxis prophylactic heparin   Pressure ulcer(s): N/A GI prophylaxis: PPI Lines: Central line and yes and it is still needed Foley:  Yes, and it is still needed Code Status:  full code Last date of multidisciplinary goals of care discussion [02/22/2024]  Labs   CBC: Recent Labs  Lab 02/20/24 0751 02/20/24 1223 02/20/24 2256 02/21/24 0420 02/22/24 0444  WBC 14.7* 18.0* 17.7* 19.3* 20.2*  NEUTROABS 12.7* 16.5* 15.9*  --   --    HGB 11.9* 12.8* 12.1* 12.8* 12.9*  HCT 37.3* 39.7 36.2* 37.1* 37.4*  MCV 102.5* 97.5 95.3 93.2 91.9  PLT 355 351 311 311 286    Basic Metabolic Panel: Recent Labs  Lab 02/20/24 0751 02/20/24 1223 02/20/24 1651 02/20/24 1745 02/21/24 0420 02/22/24 0444  NA 129* 130*  --  129* 131* 133*  K 5.3* 5.2*  --  5.0 4.2 4.1  CL 90* 89*  --  89* 94* 94*  CO2 25 25  --  26 26 29   GLUCOSE 110* 125*  --  184* 86 115*  BUN 49* 52*  --  55* 60* 68*  CREATININE 2.22* 2.21*  --  2.33* 2.24* 1.77*  CALCIUM  7.5* 8.0*  --  7.9* 7.7* 7.7*  MG 3.2*  --   --   --  2.5* 2.7*  PHOS  --  6.4* 5.6*  --  3.5 3.4   GFR: Estimated Creatinine Clearance: 45.8 mL/min (A) (by C-G formula based on SCr of 1.77 mg/dL (H)). Recent Labs  Lab 02/20/24 1223 02/20/24 1344 02/20/24 2028 02/20/24 2256 02/21/24 0420 02/22/24 0444 02/22/24 0802  WBC 18.0*  --   --  17.7* 19.3* 20.2*  --   LATICACIDVEN 5.7*   < > 5.7* 4.0* 2.1*  --  1.3   < > = values in this interval not displayed.    Liver Function Tests: Recent Labs  Lab 02/20/24 0751 02/21/24 0420 02/22/24 0444  AST 3,301* 2,959*  --   ALT 2,038* 2,111*  --   ALKPHOS 134* 119  --   BILITOT 5.5* 4.9*  --   PROT 5.7* 5.2*  --   ALBUMIN 2.7* 2.6* 2.4*   No results for input(s): LIPASE, AMYLASE in the last 168 hours. No results for input(s): AMMONIA in the last 168 hours.  ABG    Component Value Date/Time   PHART 7.35 02/20/2024 1452   PCO2ART 45 02/20/2024 1452   PO2ART 59 (L) 02/20/2024 1452   HCO3 24.8 02/20/2024 1452   ACIDBASEDEF 1.1 02/20/2024 1452   O2SAT 78.3 02/22/2024 0802     Coagulation Profile: Recent Labs  Lab 02/20/24 1344 02/20/24 2256 02/21/24 0420  INR 3.4* 2.5* 2.8*    Cardiac Enzymes: No results for input(s): CKTOTAL, CKMB, CKMBINDEX, TROPONINI in the last 168 hours.  HbA1C: Hgb A1c MFr Bld  Date/Time Value Ref Range Status  07/14/2018 04:29 AM 5.8 (H) 4.8 - 5.6 % Final    Comment:     (NOTE) Pre diabetes:          5.7%-6.4% Diabetes:              >6.4% Glycemic control for   <7.0% adults with diabetes   07/13/2018 04:25 AM 6.2 (H) 4.8 - 5.6 % Final    Comment:    (NOTE) Pre diabetes:          5.7%-6.4% Diabetes:              >  6.4% Glycemic control for   <7.0% adults with diabetes     CBG: Recent Labs  Lab 02/21/24 2012 02/21/24 2319 02/22/24 0345 02/22/24 0443 02/22/24 0721  GLUCAP 106* 110* 106* 114* 129*    Review of Systems:   N/A  Past Medical History:  He,  has a past medical history of Biventricular failure (HCC), CVA (cerebral vascular accident) (HCC), Polysubstance use disorder, and Schizophrenia (HCC).   Surgical History:   Past Surgical History:  Procedure Laterality Date   APPENDECTOMY       Social History:   reports that he has never smoked. He has never used smokeless tobacco. He reports that he does not drink alcohol and does not use drugs.   Family History:  His Family history is unknown by patient.   Allergies No Known Allergies   Home Medications  Prior to Admission medications   Medication Sig Start Date End Date Taking? Authorizing Provider  ARISTADA 882 MG/3.2ML prefilled syringe Inject 882 mg into the muscle every 30 (thirty) days. 08/23/22  Yes [provider]  atorvastatin  (LIPITOR) 40 MG tablet Take 1 tablet (40 mg total) by mouth daily at 6 PM. 07/15/18  Yes Laurence Bridegroom, MD  furosemide  (LASIX ) 40 MG tablet Take 1 tablet (40 mg total) by mouth daily. 06/19/22 02/20/24 Yes Arlander Charleston, MD  losartan (COZAAR) 25 MG tablet Take 25 mg by mouth daily. 02/10/24  Yes [provider]     Critical care time: 53 minutes    Belva November, MD [facility] 02/22/2024 9:53 AM

## 2024-02-23 ENCOUNTER — Inpatient Hospital Stay

## 2024-02-23 DIAGNOSIS — Z452 Encounter for adjustment and management of vascular access device: Secondary | ICD-10-CM | POA: Diagnosis not present

## 2024-02-23 DIAGNOSIS — I469 Cardiac arrest, cause unspecified: Secondary | ICD-10-CM | POA: Diagnosis not present

## 2024-02-23 DIAGNOSIS — J9691 Respiratory failure, unspecified with hypoxia: Secondary | ICD-10-CM | POA: Diagnosis not present

## 2024-02-23 DIAGNOSIS — R918 Other nonspecific abnormal finding of lung field: Secondary | ICD-10-CM | POA: Diagnosis not present

## 2024-02-23 DIAGNOSIS — G9341 Metabolic encephalopathy: Secondary | ICD-10-CM | POA: Diagnosis not present

## 2024-02-23 DIAGNOSIS — I5023 Acute on chronic systolic (congestive) heart failure: Secondary | ICD-10-CM | POA: Diagnosis not present

## 2024-02-23 DIAGNOSIS — R001 Bradycardia, unspecified: Secondary | ICD-10-CM

## 2024-02-23 DIAGNOSIS — Z4682 Encounter for fitting and adjustment of non-vascular catheter: Secondary | ICD-10-CM | POA: Diagnosis not present

## 2024-02-23 DIAGNOSIS — J90 Pleural effusion, not elsewhere classified: Secondary | ICD-10-CM | POA: Diagnosis not present

## 2024-02-23 LAB — CBC
HCT: 35.7 % — ABNORMAL LOW (ref 39.0–52.0)
Hemoglobin: 12 g/dL — ABNORMAL LOW (ref 13.0–17.0)
MCH: 31.5 pg (ref 26.0–34.0)
MCHC: 33.6 g/dL (ref 30.0–36.0)
MCV: 93.7 fL (ref 80.0–100.0)
Platelets: 253 10*3/uL (ref 150–400)
RBC: 3.81 MIL/uL — ABNORMAL LOW (ref 4.22–5.81)
RDW: 14.3 % (ref 11.5–15.5)
WBC: 23.6 10*3/uL — ABNORMAL HIGH (ref 4.0–10.5)
nRBC: 0.1 % (ref 0.0–0.2)

## 2024-02-23 LAB — CULTURE, RESPIRATORY W GRAM STAIN

## 2024-02-23 LAB — RENAL FUNCTION PANEL
Albumin: 2.5 g/dL — ABNORMAL LOW (ref 3.5–5.0)
Anion gap: 8 (ref 5–15)
BUN: 59 mg/dL — ABNORMAL HIGH (ref 8–23)
CO2: 32 mmol/L (ref 22–32)
Calcium: 7.6 mg/dL — ABNORMAL LOW (ref 8.9–10.3)
Chloride: 97 mmol/L — ABNORMAL LOW (ref 98–111)
Creatinine, Ser: 1.21 mg/dL (ref 0.61–1.24)
GFR, Estimated: >60 mL/min (ref >60)
Glucose, Bld: 106 mg/dL — ABNORMAL HIGH (ref 70–99)
Phosphorus: 2.6 mg/dL (ref 2.5–4.6)
Potassium: 4.1 mmol/L (ref 3.5–5.1)
Sodium: 137 mmol/L (ref 135–145)

## 2024-02-23 LAB — COOXEMETRY PANEL
Carboxyhemoglobin: 1.5 % (ref 0.5–1.5)
Carboxyhemoglobin: 1.8 % — ABNORMAL HIGH (ref 0.5–1.5)
Carboxyhemoglobin: 2.1 % — ABNORMAL HIGH (ref 0.5–1.5)
Methemoglobin: 1.7 % — ABNORMAL HIGH (ref 0.0–1.5)
Methemoglobin: <0.7 % (ref 0.0–1.5)
Methemoglobin: <0.7 % (ref 0.0–1.5)
O2 Saturation: 75.9 %
O2 Saturation: 66.8 %
O2 Saturation: 77.2 %
Total hemoglobin: <6.9 g/dL — CL (ref 12.0–16.0)
Total hemoglobin: 11.4 g/dL — ABNORMAL LOW (ref 12.0–16.0)
Total hemoglobin: 12.2 g/dL (ref 12.0–16.0)
Total oxygen content: 73.5 %
Total oxygen content: 65.6 %
Total oxygen content: 75.6 %

## 2024-02-23 LAB — MAGNESIUM
Magnesium: 2.8 mg/dL — ABNORMAL HIGH (ref 1.7–2.4)
Magnesium: 2.4 mg/dL (ref 1.7–2.4)

## 2024-02-23 LAB — BASIC METABOLIC PANEL WITH GFR
Anion gap: 8 (ref 5–15)
BUN: 53 mg/dL — ABNORMAL HIGH (ref 8–23)
CO2: 30 mmol/L (ref 22–32)
Calcium: 7.4 mg/dL — ABNORMAL LOW (ref 8.9–10.3)
Chloride: 100 mmol/L (ref 98–111)
Creatinine, Ser: 1.11 mg/dL (ref 0.61–1.24)
GFR, Estimated: >60 mL/min (ref >60)
Glucose, Bld: 111 mg/dL — ABNORMAL HIGH (ref 70–99)
Potassium: 4.1 mmol/L (ref 3.5–5.1)
Sodium: 138 mmol/L (ref 135–145)

## 2024-02-23 LAB — GLUCOSE, CAPILLARY
Glucose-Capillary: 103 mg/dL — ABNORMAL HIGH (ref 70–99)
Glucose-Capillary: 115 mg/dL — ABNORMAL HIGH (ref 70–99)
Glucose-Capillary: 101 mg/dL — ABNORMAL HIGH (ref 70–99)
Glucose-Capillary: 129 mg/dL — ABNORMAL HIGH (ref 70–99)
Glucose-Capillary: 106 mg/dL — ABNORMAL HIGH (ref 70–99)

## 2024-02-23 MED ORDER — HEPARIN (PORCINE) 25000 UT/250ML-% IV SOLN
1100.0000 [IU]/h | INTRAVENOUS | Status: DC
  Administered 2024-02-23: 1550 [IU]/h via INTRAVENOUS
  Administered 2024-02-24: 1400 [IU]/h via INTRAVENOUS
  Administered 2024-02-24: 1550 [IU]/h via INTRAVENOUS
  Administered 2024-02-25: 1400 [IU]/h via INTRAVENOUS
  Administered 2024-02-26: 1250 [IU]/h via INTRAVENOUS
  Filled 2024-02-23 (×5): qty 250

## 2024-02-23 MED ORDER — BISACODYL 10 MG RE SUPP
10.0000 mg | Freq: Once | RECTAL | Status: AC
  Administered 2024-02-23: 10 mg via RECTAL
  Filled 2024-02-23: qty 1

## 2024-02-23 MED ORDER — BISACODYL 10 MG RE SUPP: 10.0000 mg | Freq: Every day | RECTAL | Status: DC | PRN

## 2024-02-23 MED ORDER — ENOXAPARIN SODIUM 40 MG/0.4ML IJ SOSY
40.0000 mg | PREFILLED_SYRINGE | INTRAMUSCULAR | Status: DC
  Administered 2024-02-23: 40 mg via SUBCUTANEOUS
  Filled 2024-02-23: qty 0.4

## 2024-02-23 MED ORDER — FUROSEMIDE 10 MG/ML IJ SOLN
60.0000 mg | Freq: Once | INTRAMUSCULAR | Status: AC
  Administered 2024-02-23: 60 mg via INTRAVENOUS
  Filled 2024-02-23: qty 6

## 2024-02-23 MED ORDER — LACTULOSE 10 GM/15ML PO SOLN
20.0000 g | Freq: Once | ORAL | Status: AC
  Administered 2024-02-23: 20 g
  Filled 2024-02-23: qty 30

## 2024-02-23 NOTE — Progress Notes (Signed)
 Pt bradycardic to 42 and sustained in the 40's. CHARLENA Nose, NP at bedside. Verbal order received to increase Dobutamine  to 5mcg/kg/min, renal panel, and mag labs added on for this morning, as well. Pt BP maintained and no changes to mental status.

## 2024-02-23 NOTE — Plan of Care (Signed)
  Problem: Clinical Measurements: Goal: Ability to maintain clinical measurements within normal limits will improve Outcome: Progressing Goal: Will remain free from infection Outcome: Progressing Goal: Diagnostic test results will improve Outcome: Progressing Goal: Respiratory complications will improve Outcome: Progressing Goal: Cardiovascular complication will be avoided Outcome: Progressing   Problem: Nutrition: Goal: Adequate nutrition will be maintained Outcome: Progressing   Problem: Elimination: Goal: Will not experience complications related to bowel motility Outcome: Progressing Goal: Will not experience complications related to urinary retention Outcome: Progressing   Problem: Pain Managment: Goal: General experience of comfort will improve and/or be controlled Outcome: Progressing   Problem: Skin Integrity: Goal: Risk for impaired skin integrity will decrease Outcome: Progressing   Problem: Neurologic: Goal: Promote progressive neurologic recovery Outcome: Not Progressing

## 2024-02-23 NOTE — Progress Notes (Signed)
 1532 PM: Intensivist has been informed that the patient's heart rate dropped to 48 beats for minute has has remained in the 50's for about 45 minutes. Dgayli was ordered 60 mg of IV lasix  and has approved Lasix  administration  at this time. Also Lactulose and rectal suppository has been order in order to stimulate bowels.

## 2024-02-23 NOTE — Progress Notes (Signed)
 NAME:  Miguel [name], MRN:  [id number], DOB:  [birth year], LOS: 3 ADMISSION DATE:  02/20/2024, CHIEF COMPLAINT:  Cardiac Arrest   History of Present Illness:   This is a 73 yo male who presented to [facility] ER via EMS from jail on 06/26 post cardiac arrest.  Per ER notes pts cellmate heard the pt gurgling this morning.  He was found pulseless with initial cardiac rhythm asystole.  EMS administered 3 doses of epi/2 mg of narcan .  Following administration of narcan  ROSC achieved.  Estimated downtime 15 minutes.  EMS reported en route to the ER pt became agitated and attempted to remove Igel.  EMS administered versed.  Pt became pulseless again en route ACLS protocol initiated.   ED Course  Upon arrival to the ER pt remained pulseless in PEA.  ACLS protocol continued pt received: 2 mg of epi/4 mg of iv narcan /1 amp of bicarb/1 g of calcium .  Igel exchanged for an ETT with ongoing CPR (pt did not require RSI medications for intubation).  ROSC achieved with estimated downtime during 2nd cardiac arrest 15 minutes.  CTA Chest negative for PE.  Significant lab results were: Na+ 129/K+ 5.3/chloride 90/glucose 110/BUN 49/creatinine 2.22/calcium  7.5/mag 3.2/alk phos 134/AST 3,301/ALT 2,038/total bilirubin 5.5/troponin 344/lactic acid 8.1/urine drug screen negative/UA concerning for possible UTI.  Sepsis protocol initiated pt to receive: cefepime /metronidazole /vancomycin .  Pt found to be severely hypoglycemic CBG 17 requiring 1 amp of D50W.  Levophed  gtt initiated due to hypotension.  PCCM team contacted for ICU admission.  Pertinent  Medical History  Schizophrenia  Depression Chronic Systolic CHF (Echo 09/11/22: EF <20%, grade II diastolic dysfunction, trivial pericardial effusion, mild mitral valve regurgitation, trivial tricuspid valve regurgitation, trivial pulmonic valve regurgitation) CVA with aphasia Tobacco Use Disorder  Significant Hospital Events: Including procedures, antibiotic start and stop dates  in addition to other pertinent events   02/20/24: admission following cardiac arrest. Intubated on arrival to ED, admit to ICU, on nor-epi and dobutamine  02/21/24: on minimal sedation, discontinued this AM. Off nor-epi, continued on dobutamine  02/22/24: decreased dobutamine  to 2.5, remains off sedation 02/23/24: arouses to tactile and verbal stimuli, remains disoriented. Failed SBT this AM. Dobutamine  increased to 5 mcg overnight due to bradycardia  Interim History / Subjective:  Remain encephalopathic, but more awake. No purposeful movement noted  Objective    Blood pressure (!) 123/47, pulse (!) 50, temperature 98.2 F (36.8 C), resp. rate (!) 21, height 6' (1.829 m), weight 97 kg, SpO2 98%.    Vent Mode: PSV;CPAP FiO2 (%):  [40 %] 40 % Set Rate:  [20 bmp] 20 bmp Vt Set:  [500 mL] 500 mL PEEP:  [5 cmH20-8 cmH20] 5 cmH20 Pressure Support:  [10 cmH20] 10 cmH20 Plateau Pressure:  [17 cmH20-22 cmH20] 19 cmH20   Intake/Output Summary (Last 24 hours) at 02/23/2024 1455 Last data filed at 02/23/2024 1421 Gross per 24 hour  Intake 1661.53 ml  Output 2170 ml  Net -508.47 ml   Filed Weights   02/21/24 0452 02/22/24 0438 02/23/24 0415  Weight: 98.2 kg 94.8 kg 97 kg    Examination:  Physical Exam Constitutional:      General: He is not in acute distress.    Appearance: He is ill-appearing.   Cardiovascular:     Rate and Rhythm: Normal rate and regular rhythm.     Pulses: Normal pulses.     Heart sounds: Normal heart sounds.  Pulmonary:     Comments: Ventilated breath sounds bilaterally Abdominal:  General: There is distension.     Palpations: Abdomen is soft.   Musculoskeletal:     Right lower leg: Edema present.     Left lower leg: Edema present.   Neurological:     Mental Status: He is disoriented.     Comments: Opens eyes, non-purposeful movements    Assessment and Plan   73 year old male with history of HFrEF who presented to the hospital from jail following a  witnessed cardiac arrest. He underwent two rounds of ACLS (10 minutes in jail, 15 minutes in the ED) with ROSC, now intubated and mechanically ventilated and requiring propofol  for analgesia. POCUS showed severely depressed RV and LV function.   Neuro  #Toxic Metabolic Encephalopathy #History of Schizophrenia #At risk for anoxic brain injury #Polysubstance Use Disorder #History of CVA  We've discontinued analgosedation to allow mental status to clear and for proper assessment of mental state and chances of neurological recovery. Propofol  goal RASS -1, fentanyl  for CPOT < 2 but he hasn't required any medications for sedation or analgesia. He is disoriented, but with improvement in mental status. CT head within normal. Continue to await and re-assess mental status for neurological recover  -d/c all sedation, assess mental status -EEG performed, encephalopathy  CV #Cardiac Arrest  #PEA arrest #Cardiogenic Shock #Acute on Chronic HFrEF #Possible LV Thrombus  He has a history of HFrEF on previous echo, lost to follow up with cardiology. Now presents with cardiac arrest and profound shock with multi-organ failure. POCUS showed severely depressed LVEF; TTE showed LV thrombus. CVC placed, and co-ox was depressed. Initiated on dobutamine  for ionotropic support, with improvement in central venous saturation. Dobutamine  dose titrated to 2.5 mcg yesterday, but given bradycardia overnight we've had to bring it back up to 5 mcg. Will continue to trend central venous saturation and initiate IV diuresis today. While there was concern for LV thrombus on TTE, he was in DIC and heparin  was held given concern for DIC. Will start heparin  now that DIC is improved and obtain a repeat TTE tomorrow to reassess for any clot.  -dobutamine  increased to 5 mcg/kg/min -TTE in AM -trend central venous O2 sat -IV diuresis today  Pulm #Acute Hypoxic Respiratory Failure #Aspiration Pneumonia  Respiratory failure  secondary to cardiac arrest, with POCUS showing bilateral lower lobe consolidations and trace pleural effusions. This is consistent with aspiration secondary to the cardiac arrest. Mechanical ventilation initiated and he is requiring a significant amount of FiO2, likely due to aspiration of gastric content and bilateral lower lobe consolidations. Bronchoscopy was performed at the bedside on admission for aspiration of secretions and noted to have gastric content in the airway. His compliance and O2 requirements are improved and he is on minimal vent settings. SBT this AM but failed.  -Full vent support, implement lung protective strategies -Plateau pressures less than 30 cm H20 -Wean FiO2 & PEEP as tolerated to maintain O2 sats >92% -Spontaneous Breathing Trials when respiratory parameters met and mental status permits, failed SBT today -Implement VAP Bundle -antibiotics per ID section  GI #Shock Liver  elevated LFT's due to shock liver. This is improved. On PPI for SUP. On tube feeds.  Renal #AKI #Metabolic Acidosis  AKI in the setting of cardiac arrest which continues to improve. Holding nephrotoxins, and will attempt diuresis given improvement in kidney function.  -furosemide  60 mg once  Endo  ICU glycemic protocol  Hem/Onc #DIC - improved #Possible LV Thrombus  heparin  gtt considered but ultimately not for LV thrombus  given concern for DIC. With improvement in DIC, we will start heparin  gtt. He did receive cryo and FFP with improvement in fibrinogen .  -heparin  gtt -recheck fibrinogen  level  ID  #Aspiration Pneumonia  broad spectrum antibiotics for aspiration pneumonia. MRSA screen was negative. Respiratory cultures are growing H. Influenza and S. Pneumo, transitioned antibiotics to Ceftriaxone and Azithromycin   -d/c vancomycin , d/c cefepime  -CTX and Azithromycin   Best Practice (right click and Reselect all SmartList Selections daily)   Diet/type: tubefeeds DVT  prophylaxis systemic heparin  Pressure ulcer(s): N/A GI prophylaxis: PPI Lines: Central line and yes and it is still needed Foley:  Yes, and it is still needed Code Status:  full code Last date of multidisciplinary goals of care discussion [02/23/2024]  Labs   CBC: Recent Labs  Lab 02/20/24 0751 02/20/24 1223 02/20/24 2256 02/21/24 0420 02/22/24 0444 02/23/24 0422  WBC 14.7* 18.0* 17.7* 19.3* 20.2* 23.6*  NEUTROABS 12.7* 16.5* 15.9*  --   --   --   HGB 11.9* 12.8* 12.1* 12.8* 12.9* 12.0*  HCT 37.3* 39.7 36.2* 37.1* 37.4* 35.7*  MCV 102.5* 97.5 95.3 93.2 91.9 93.7  PLT 355 351 311 311 286 253    Basic Metabolic Panel: Recent Labs  Lab 02/20/24 0751 02/20/24 1223 02/20/24 1651 02/20/24 1745 02/21/24 0420 02/22/24 0444 02/23/24 0422  NA 129* 130*  --  129* 131* 133* 137  K 5.3* 5.2*  --  5.0 4.2 4.1 4.1  CL 90* 89*  --  89* 94* 94* 97*  CO2 25 25  --  26 26 29  32  GLUCOSE 110* 125*  --  184* 86 115* 106*  BUN 49* 52*  --  55* 60* 68* 59*  CREATININE 2.22* 2.21*  --  2.33* 2.24* 1.77* 1.21  CALCIUM  7.5* 8.0*  --  7.9* 7.7* 7.7* 7.6*  MG 3.2*  --   --   --  2.5* 2.7* 2.8*  PHOS  --  6.4* 5.6*  --  3.5 3.4 2.6   GFR: Estimated Creatinine Clearance: 67.6 mL/min (by C-G formula based on SCr of 1.21 mg/dL). Recent Labs  Lab 02/20/24 2028 02/20/24 2256 02/21/24 0420 02/22/24 0444 02/22/24 0802 02/23/24 0422  WBC  --  17.7* 19.3* 20.2*  --  23.6*  LATICACIDVEN 5.7* 4.0* 2.1*  --  1.3  --     Liver Function Tests: Recent Labs  Lab 02/20/24 0751 02/21/24 0420 02/22/24 0444 02/23/24 0422  AST 3,301* 2,959*  --   --   ALT 2,038* 2,111*  --   --   ALKPHOS 134* 119  --   --   BILITOT 5.5* 4.9*  --   --   PROT 5.7* 5.2*  --   --   ALBUMIN 2.7* 2.6* 2.4* 2.5*   No results for input(s): LIPASE, AMYLASE in the last 168 hours. No results for input(s): AMMONIA in the last 168 hours.  ABG    Component Value Date/Time   PHART 7.35 02/20/2024 1452   PCO2ART  45 02/20/2024 1452   PO2ART 59 (L) 02/20/2024 1452   HCO3 24.8 02/20/2024 1452   ACIDBASEDEF 1.1 02/20/2024 1452   O2SAT 77.2 02/23/2024 1405     Coagulation Profile: Recent Labs  Lab 02/20/24 1344 02/20/24 2256 02/21/24 0420  INR 3.4* 2.5* 2.8*    Cardiac Enzymes: No results for input(s): CKTOTAL, CKMB, CKMBINDEX, TROPONINI in the last 168 hours.  HbA1C: Hgb A1c MFr Bld  Date/Time Value Ref Range Status  07/14/2018 04:29 AM 5.8 (H) 4.8 -  5.6 % Final    Comment:    (NOTE) Pre diabetes:          5.7%-6.4% Diabetes:              >6.4% Glycemic control for   <7.0% adults with diabetes   07/13/2018 04:25 AM 6.2 (H) 4.8 - 5.6 % Final    Comment:    (NOTE) Pre diabetes:          5.7%-6.4% Diabetes:              >6.4% Glycemic control for   <7.0% adults with diabetes     CBG: Recent Labs  Lab 02/22/24 1932 02/22/24 2329 02/23/24 0349 02/23/24 0742 02/23/24 1109  GLUCAP 70 90 103* 115* 101*    Review of Systems:   N/A  Past Medical History:  He,  has a past medical history of Biventricular failure (HCC), CVA (cerebral vascular accident) (HCC), Polysubstance use disorder, and Schizophrenia (HCC).   Surgical History:   Past Surgical History:  Procedure Laterality Date   APPENDECTOMY       Social History:   reports that he has never smoked. He has never used smokeless tobacco. He reports that he does not drink alcohol and does not use drugs.   Family History:  His Family history is unknown by patient.   Allergies No Known Allergies   Home Medications  Prior to Admission medications   Medication Sig Start Date End Date Taking? Authorizing Provider  ARISTADA 882 MG/3.2ML prefilled syringe Inject 882 mg into the muscle every 30 (thirty) days. 08/23/22  Yes [provider]  atorvastatin  (LIPITOR) 40 MG tablet Take 1 tablet (40 mg total) by mouth daily at 6 PM. 07/15/18  Yes Laurence Bridegroom, MD  furosemide  (LASIX ) 40 MG tablet Take 1 tablet  (40 mg total) by mouth daily. 06/19/22 02/20/24 Yes Arlander Charleston, MD  losartan (COZAAR) 25 MG tablet Take 25 mg by mouth daily. 02/10/24  Yes [provider]     The patient is critically ill due to cardiac arrest, cardiogenic shock and respiratory failure.  Critical care was necessary to treat or prevent imminent or life-threatening deterioration. Critical care time was spent by me on the following activities: development of a treatment plan with the patient and/or surrogate as well as nursing, discussions with consultants, evaluation of the patient's response to treatment, examination of the patient, obtaining a history from the patient or surrogate, ordering and performing treatments and interventions, ordering and review of laboratory studies, ordering and review of radiographic studies, review of telemetry data including pulse oximetry, re-evaluation of patient's condition and participation in multidisciplinary rounds.   I personally spent 70 minutes providing critical care not including any separately billable procedures.  Belva November, MD [facility] 02/23/2024 3:10 PM

## 2024-02-23 NOTE — Progress Notes (Signed)
 PHARMACY CONSULT NOTE - FOLLOW UP  Pharmacy Consult for Electrolyte Monitoring and Replacement   Recent Labs: Potassium (mmol/L)  Date Value  02/23/2024 4.1  09/15/2012 4.4   Magnesium  (mg/dL)  Date Value  93/70/7974 2.8 (H)   Calcium  (mg/dL)  Date Value  93/70/7974 7.6 (L)   Calcium , Total (mg/dL)  Date Value  98/79/7985 9.4   Albumin (g/dL)  Date Value  93/70/7974 2.5 (L)  09/15/2012 4.2   Phosphorus (mg/dL)  Date Value  93/70/7974 2.6   Sodium (mmol/L)  Date Value  02/23/2024 137  09/15/2012 146 (H)     Assessment: 73 y.o. male w/ PMH of CHF, HTN, CVA, schizophrenia admitted on 02/20/2024 with cardiac arrest. Pharmacy is asked to follow and replace electrolytes while in CCU   Free water  30 ml q4H.   Goal of Therapy:  Electrolytes WNL  Plan:  No replacement needed. Electrolytes stable.  F/u with AM labs  Cathaleen GORMAN Blanch ,PharmD Clinical Pharmacist 02/23/2024 8:04 AM

## 2024-02-23 NOTE — IPAL (Signed)
  Interdisciplinary Goals of Care Family Meeting   Date carried out: 02/23/2024  Location of the meeting: Conference room  Member's involved: Physician and Family Member or next of kin  Durable Power of Attorney or Environmental health practitioner: Patient's brother, 2 sisters, 2 nieces, and sister in Social worker.    Discussion: We discussed goals of care for [facility]. We went over Mr. Gerrard overall condition, including his cardiac arrest that was secondary to his cardiomyopathy. Explained his encephalopathy and mental state, and went over scenarios for post cardiac arrest outcomes. Given he's shown some improvement in mental status, he has the chance of further improving, and we agreed that he would require more time on the ventilator to better assess his chances of meaningful recovery. Family understand that he is at high risk of developing severe neurological impairment, and could not recover to the point where he could be independent in his activities of daily living. Overall agreed to give him more time, and re-visit goals of care depending on any improvement (or lack thereof).  Code status:   Code Status: Full Code   Disposition: Continue current acute care  Time spent for the meeting: 20 minutes    Belva November, MD  02/23/2024, 4:28 PM

## 2024-02-23 NOTE — Progress Notes (Signed)
 PHARMACY - ANTICOAGULATION CONSULT NOTE  Pharmacy Consult for Heparin  Infusion Indication: possible LV thrombus  No Known Allergies  Patient Measurements: Height: 6' (182.9 cm) Weight: 97 kg (213 lb 13.5 oz) IBW/kg (Calculated) : 77.6  Vital Signs: Temp: 98.4 F (36.9 C) (06/29 1500) Temp Source: Bladder (06/29 0800) BP: 119/63 (06/29 1445) Pulse Rate: 53 (06/29 1500)  Labs: Recent Labs    02/20/24 2106 02/20/24 2256 02/20/24 2256 02/20/24 2330 02/21/24 0420 02/22/24 0444 02/23/24 0422 02/23/24 1518  HGB  --  12.1*   < >  --  12.8* 12.9* 12.0*  --   HCT  --  36.2*   < >  --  37.1* 37.4* 35.7*  --   PLT  --  311   < >  --  311 286 253  --   LABPROT  --  28.5*  --   --  30.7*  --   --   --   INR  --  2.5*  --   --  2.8*  --   --   --   CREATININE  --   --   --   --  2.24* 1.77* 1.21 1.11  TROPONINIHS 2,286*  --   --  2,551* 2,304*  --   --   --    < > = values in this interval not displayed.    Estimated Creatinine Clearance: 73.7 mL/min (by C-G formula based on SCr of 1.11 mg/dL).   Medical History: Past Medical History:  Diagnosis Date   Biventricular failure (HCC)    CVA (cerebral vascular accident) (HCC)    Polysubstance use disorder    Schizophrenia (HCC)    Assessment: Patient is 73yo male with a possible LV thrombus. Pharmacy consulted for Heparin  dosing. Patient was not initially started on Heparin  due to concern for DIC, but now improved. Patient did receive Enoxaparin  40mg  ~12:00.  Goal of Therapy:  Heparin  level 0.3-0.7 units/ml Monitor platelets by anticoagulation protocol: Yes   Plan:  Give NO bolus units bolus x 1 Start heparin  infusion at 1550 units/hr Check anti-Xa level in 8 hours and daily while on heparin  Continue to monitor H&H and platelets  Olam Fritter, PharmD, BCPS 02/23/2024 4:34 PM

## 2024-02-23 NOTE — Progress Notes (Signed)
 Rounding Note   Patient Name: Miguel [name] Date of Encounter: 02/23/2024  [facility] Cardiologist: None   Subjective Reported bradycardia overnight into the 40s bpm.  Review of telemetry shows the patient was in ventricular bigeminy at that time with heart rate in the 80s bpm.  Remains on dobutamine , off sedation.   Scheduled Meds:  Chlorhexidine  Gluconate Cloth  6 each Topical Daily   docusate  100 mg Per Tube BID   feeding supplement (PROSource TF20)  60 mL Per Tube BID   free water   30 mL Per Tube Q4H   heparin  injection (subcutaneous)  5,000 Units Subcutaneous Q8H   multivitamin with minerals  1 tablet Per Tube Daily   mouth rinse  15 mL Mouth Rinse Q2H   pantoprazole  (PROTONIX ) IV  40 mg Intravenous QHS   polyethylene glycol  17 g Per Tube Daily   sodium chloride  flush  10-40 mL Intracatheter Q12H   thiamine  (VITAMIN B1) injection  100 mg Intravenous Daily   Continuous Infusions:  azithromycin  Stopped (02/22/24 1212)   cefTRIAXone (ROCEPHIN)  IV Stopped (02/22/24 1101)   DOBUTamine  5 mcg/kg/min (02/23/24 0727)   feeding supplement (VITAL 1.5 CAL) 50 mL/hr at 02/23/24 0727   norepinephrine  (LEVOPHED ) Adult infusion Stopped (02/20/24 1758)   PRN Meds: docusate sodium , mouth rinse, polyethylene glycol, sodium chloride  flush   Vital Signs  Vitals:   02/23/24 0730 02/23/24 0745 02/23/24 0800 02/23/24 0815  BP: (!) 121/57 125/61 (!) 118/57 126/63  Pulse:  84 83 87  Resp: 18 15 20 12   Temp: (!) 97 F (36.1 C) (!) 97 F (36.1 C) (!) 97.2 F (36.2 C) (!) 97.2 F (36.2 C)  TempSrc:      SpO2:  93% 93% 92%  Weight:      Height:        Intake/Output Summary (Last 24 hours) at 02/23/2024 0900 Last data filed at 02/23/2024 0803 Gross per 24 hour  Intake 1808.77 ml  Output 1970 ml  Net -161.23 ml      02/23/2024    4:15 AM 02/22/2024    4:38 AM 02/21/2024    4:52 AM  Last 3 Weights  Weight (lbs) 213 lb 13.5 oz 208 lb 15.9 oz 216 lb 7.9 oz  Weight  (kg) 97 kg 94.8 kg 98.2 kg      Telemetry Sinus rhythm with PVCs and brief episode of ventricular bigeminy - Personally Reviewed   Physical Exam GEN: No acute distress.  Intubated. Neck: No JVD Cardiac: RRR, no murmurs, rubs, or gallops.  Respiratory: Vented breath sounds GI: Soft, nontender, non-distended  MS: No edema; No deformity. Neuro: Intubated, unable to assess Psych: Unable to assess  Labs High Sensitivity Troponin:   Recent Labs  Lab 02/20/24 1007 02/20/24 1223 02/20/24 2106 02/20/24 2330 02/21/24 0420  TROPONINIHS 528* 939* 2,286* 2,551* 2,304*     Chemistry Recent Labs  Lab 02/20/24 0751 02/20/24 1223 02/21/24 0420 02/22/24 0444 02/23/24 0422  NA 129*   < > 131* 133* 137  K 5.3*   < > 4.2 4.1 4.1  CL 90*   < > 94* 94* 97*  CO2 25   < > 26 29 32  GLUCOSE 110*   < > 86 115* 106*  BUN 49*   < > 60* 68* 59*  CREATININE 2.22*   < > 2.24* 1.77* 1.21  CALCIUM  7.5*   < > 7.7* 7.7* 7.6*  MG 3.2*  --  2.5* 2.7* 2.8*  PROT 5.7*  --  5.2*  --   --   ALBUMIN 2.7*  --  2.6* 2.4* 2.5*  AST 3,301*  --  2,959*  --   --   ALT 2,038*  --  2,111*  --   --   ALKPHOS 134*  --  119  --   --   BILITOT 5.5*  --  4.9*  --   --   GFRNONAA 31*   < > 31* 41* >60  ANIONGAP 14   < > 11 10 8    < > = values in this interval not displayed.    Lipids  Recent Labs  Lab 02/21/24 0420  TRIG 53    Hematology Recent Labs  Lab 02/21/24 0420 02/22/24 0444 02/23/24 0422  WBC 19.3* 20.2* 23.6*  RBC 3.98* 4.07* 3.81*  HGB 12.8* 12.9* 12.0*  HCT 37.1* 37.4* 35.7*  MCV 93.2 91.9 93.7  MCH 32.2 31.7 31.5  MCHC 34.5 34.5 33.6  RDW 13.4 13.5 14.3  PLT 311 286 253   Thyroid No results for input(s): TSH, FREET4 in the last 168 hours.  BNP Recent Labs  Lab 02/20/24 0753  BNP 819.5*    DDimer No results for input(s): DDIMER in the last 168 hours.   Radiology  No results found.   Cardiac Studies  2D echo 02/20/2024: 1. Left ventricular ejection fraction, by  estimation, is 20 to 25%. Left  ventricular ejection fraction by 2D MOD biplane is 26.0 %. The left  ventricle has severely decreased function. The left ventricle demonstrates  global hypokinesis. Left ventricular  diastolic parameters are indeterminate.   2. Definity  images detailing very slow flow in the periapical region,  possible mural thrombus in the apical region.   3. Right ventricular systolic function is severely reduced. The right  ventricular size is moderately enlarged. There is normal pulmonary artery  systolic pressure. The estimated right ventricular systolic pressure is  15.0 mmHg.   4. The mitral valve is normal in structure. No evidence of mitral valve  regurgitation. No evidence of mitral stenosis.   5. The aortic valve has an indeterminant number of cusps. Aortic valve  regurgitation is not visualized. Aortic valve sclerosis is present, with  no evidence of aortic valve stenosis.   6. The inferior vena cava is dilated in size with >50% respiratory  variability, suggesting right atrial pressure of 8 mmHg.  __________   2D echo 09/11/2022: 1. Left ventricular ejection fraction, by estimation, is <20%. The left  ventricle has severely decreased function. The left ventricle demonstrates  global hypokinesis. There is mild left ventricular hypertrophy. Left  ventricular diastolic parameters are  consistent with Grade II diastolic dysfunction (pseudonormalization).  Elevated left atrial pressure.   2. Right ventricular systolic function is severely reduced. The right  ventricular size is moderately enlarged.   3. Right atrial size was mildly dilated.   4. Moderate pleural effusion in the left lateral region.   5. The mitral valve is abnormal. Mild mitral valve regurgitation.   6. The aortic valve is tricuspid. There is moderate thickening of the  aortic valve. Aortic valve regurgitation is not visualized. Aortic valve  sclerosis is present, with no evidence of aortic  valve stenosis.  __________   2D echo 07/13/2018: - Left ventricle: The cavity size was mildly dilated. There was    mild concentric hypertrophy. Systolic function was severely    reduced. The estimated ejection fraction was in the range of 25%    to 30%. Diffuse hypokinesis. Hypokinesis of  the anteroseptal    myocardium. Hypokinesis of the anterior myocardium. Hypokinesis    of the apical myocardium.  - Mitral valve: There was mild regurgitation.  - Right ventricle: Systolic function was normal.  - Pulmonary arteries: Systolic pressure was within the normal    range.   Impressions:   - high risk of LV mural thrombus given large regions of severe    hypokinesis (possibly akinesis).   Patient Profile   73 y.o. 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  Assessment & Plan  Out-of-hospital cardiac arrest with cardiogenic shock and severe biventricular failure with acute hypoxic respiratory failure, multiorgan failure with AKI likely related to ATN from cardiac arrest and transaminitis likely related to shock liver, possible apical mural thrombus, and aspiration pneumonia:  - Uncertain downtime, status post 2 separate rounds of ACLS - Troponin peaked at 2551, not a candidate for emergent LHC at presentation given multiorgan failure and uncertain downtime  - Likely VT/V-fib induced due to cardiomyopathy EF 20 to 25%. - Still intubated, continue supportive care. - On low-dose dobutamine  drip - Consider cardiac cath pending daily for neurologic recovery - Patient would not currently be a candidate for advanced heart failure therapies such as ICD or LVAD given incarceration, polysubstance use, and medical nonadherence - Heparin  gtt has been deferred for possible mural thrombus in the setting of shock liver with elevated INR - Unable to escalate GDMT at this time in the presence of cardiogenic  shock requiring inotropic support and multiorgan failure - Avoid nephrotoxic agents - Multiorgan dysfunction improving - Overall prognosis remains guarded    Signed, Bernardino Bring, PA-C  02/23/2024, 9:00 AM

## 2024-02-23 NOTE — Plan of Care (Signed)
 The patient continuous to be Intubated. Failed SBT trial as his respirations when up to the 40's a few minutes after initiating SBT trial. The patient continues to have involuntary movements and occasional eye tracking when awake. The patient seems to be drowsier compare to yesterday. Chest x-ray was obtained today. Heparin  drip has been initiated. Q2 turns. Family and ICU provider had a plan of care meeting today. Tube feeds continue to be at goal of 57ml/hr. Temp foley in place.  Problem: Education: Goal: Knowledge of General Education information will improve Description: Including pain rating scale, medication(s)/side effects and non-pharmacologic comfort measures Outcome: Progressing   Problem: Health Behavior/Discharge Planning: Goal: Ability to manage health-related needs will improve Outcome: Progressing   Problem: Clinical Measurements: Goal: Ability to maintain clinical measurements within normal limits will improve Outcome: Progressing Goal: Will remain free from infection Outcome: Progressing Goal: Diagnostic test results will improve Outcome: Progressing Goal: Respiratory complications will improve Outcome: Progressing Goal: Cardiovascular complication will be avoided Outcome: Progressing   Problem: Activity: Goal: Risk for activity intolerance will decrease Outcome: Progressing   Problem: Nutrition: Goal: Adequate nutrition will be maintained Outcome: Progressing   Problem: Coping: Goal: Level of anxiety will decrease Outcome: Progressing   Problem: Elimination: Goal: Will not experience complications related to bowel motility Outcome: Progressing Goal: Will not experience complications related to urinary retention Outcome: Progressing   Problem: Pain Managment: Goal: General experience of comfort will improve and/or be controlled Outcome: Progressing   Problem: Safety: Goal: Ability to remain free from injury will improve Outcome: Progressing   Problem:  Skin Integrity: Goal: Risk for impaired skin integrity will decrease Outcome: Progressing   Problem: Education: Goal: Ability to manage disease process will improve Outcome: Progressing   Problem: Cardiac: Goal: Ability to achieve and maintain adequate cardiopulmonary perfusion will improve Outcome: Progressing   Problem: Neurologic: Goal: Promote progressive neurologic recovery Outcome: Progressing   Problem: Skin Integrity: Goal: Risk for impaired skin integrity will be minimized. Outcome: Progressing

## 2024-02-24 ENCOUNTER — Inpatient Hospital Stay
Admit: 2024-02-24 | Discharge: 2024-02-24 | Disposition: A | Discharge status: Home / Self Care | Attending: Student in an Organized Health Care Education/Training Program | Admitting: Student in an Organized Health Care Education/Training Program

## 2024-02-24 ENCOUNTER — Inpatient Hospital Stay

## 2024-02-24 DIAGNOSIS — I4581 Long QT syndrome: Secondary | ICD-10-CM

## 2024-02-24 DIAGNOSIS — I4892 Unspecified atrial flutter: Secondary | ICD-10-CM

## 2024-02-24 DIAGNOSIS — D649 Anemia, unspecified: Secondary | ICD-10-CM | POA: Diagnosis not present

## 2024-02-24 DIAGNOSIS — G9341 Metabolic encephalopathy: Secondary | ICD-10-CM | POA: Diagnosis not present

## 2024-02-24 DIAGNOSIS — B963 Hemophilus influenzae [H. influenzae] as the cause of diseases classified elsewhere: Secondary | ICD-10-CM

## 2024-02-24 DIAGNOSIS — G9389 Other specified disorders of brain: Secondary | ICD-10-CM | POA: Diagnosis not present

## 2024-02-24 DIAGNOSIS — I63512 Cerebral infarction due to unspecified occlusion or stenosis of left middle cerebral artery: Secondary | ICD-10-CM | POA: Diagnosis not present

## 2024-02-24 DIAGNOSIS — I441 Atrioventricular block, second degree: Secondary | ICD-10-CM

## 2024-02-24 DIAGNOSIS — J9600 Acute respiratory failure, unspecified whether with hypoxia or hypercapnia: Secondary | ICD-10-CM | POA: Diagnosis not present

## 2024-02-24 DIAGNOSIS — I5023 Acute on chronic systolic (congestive) heart failure: Secondary | ICD-10-CM | POA: Diagnosis not present

## 2024-02-24 DIAGNOSIS — I469 Cardiac arrest, cause unspecified: Secondary | ICD-10-CM | POA: Diagnosis not present

## 2024-02-24 DIAGNOSIS — I63522 Cerebral infarction due to unspecified occlusion or stenosis of left anterior cerebral artery: Secondary | ICD-10-CM | POA: Diagnosis not present

## 2024-02-24 DIAGNOSIS — R9082 White matter disease, unspecified: Secondary | ICD-10-CM | POA: Diagnosis not present

## 2024-02-24 LAB — ECHOCARDIOGRAM LIMITED
Est EF: 20
Height: 72 in
Weight: 3259.28 [oz_av]

## 2024-02-24 LAB — GLUCOSE, CAPILLARY
Glucose-Capillary: 102 mg/dL — ABNORMAL HIGH (ref 70–99)
Glucose-Capillary: 112 mg/dL — ABNORMAL HIGH (ref 70–99)
Glucose-Capillary: 119 mg/dL — ABNORMAL HIGH (ref 70–99)
Glucose-Capillary: 122 mg/dL — ABNORMAL HIGH (ref 70–99)
Glucose-Capillary: 86 mg/dL (ref 70–99)
Glucose-Capillary: 97 mg/dL (ref 70–99)

## 2024-02-24 LAB — CBC
HCT: 35.8 % — ABNORMAL LOW (ref 39.0–52.0)
Hemoglobin: 11.9 g/dL — ABNORMAL LOW (ref 13.0–17.0)
MCH: 31.7 pg (ref 26.0–34.0)
MCHC: 33.2 g/dL (ref 30.0–36.0)
MCV: 95.5 fL (ref 80.0–100.0)
Platelets: 238 10*3/uL (ref 150–400)
RBC: 3.75 MIL/uL — ABNORMAL LOW (ref 4.22–5.81)
RDW: 14.6 % (ref 11.5–15.5)
WBC: 20.9 10*3/uL — ABNORMAL HIGH (ref 4.0–10.5)
nRBC: 0.2 % (ref 0.0–0.2)

## 2024-02-24 LAB — COOXEMETRY PANEL
Carboxyhemoglobin: 2.7 % — ABNORMAL HIGH (ref 0.5–1.5)
Carboxyhemoglobin: 1.6 % — ABNORMAL HIGH (ref 0.5–1.5)
Methemoglobin: <0.7 % (ref 0.0–1.5)
Methemoglobin: <0.7 % (ref 0.0–1.5)
O2 Saturation: 90.1 %
O2 Saturation: 68.7 %
Total hemoglobin: 11.4 g/dL — ABNORMAL LOW (ref 12.0–16.0)
Total hemoglobin: 11.5 g/dL — ABNORMAL LOW (ref 12.0–16.0)
Total oxygen content: 87.7 %
Total oxygen content: 67.3 %

## 2024-02-24 LAB — RENAL FUNCTION PANEL
Albumin: 2.6 g/dL — ABNORMAL LOW (ref 3.5–5.0)
Anion gap: 10 (ref 5–15)
BUN: 52 mg/dL — ABNORMAL HIGH (ref 8–23)
CO2: 33 mmol/L — ABNORMAL HIGH (ref 22–32)
Calcium: 7.9 mg/dL — ABNORMAL LOW (ref 8.9–10.3)
Chloride: 98 mmol/L (ref 98–111)
Creatinine, Ser: 1.04 mg/dL (ref 0.61–1.24)
GFR, Estimated: >60 mL/min (ref >60)
Glucose, Bld: 117 mg/dL — ABNORMAL HIGH (ref 70–99)
Phosphorus: 2.8 mg/dL (ref 2.5–4.6)
Potassium: 4.3 mmol/L (ref 3.5–5.1)
Sodium: 141 mmol/L (ref 135–145)

## 2024-02-24 LAB — TRIGLYCERIDES: Triglycerides: 50 mg/dL (ref <150)

## 2024-02-24 LAB — HEPARIN LEVEL (UNFRACTIONATED)
Heparin Unfractionated: 0.62 [IU]/mL (ref 0.30–0.70)
Heparin Unfractionated: 0.69 [IU]/mL (ref 0.30–0.70)
Heparin Unfractionated: 0.73 [IU]/mL — ABNORMAL HIGH (ref 0.30–0.70)

## 2024-02-24 LAB — LEGIONELLA PNEUMOPHILA SEROGP 1 UR AG: L. pneumophila Serogp 1 Ur Ag: NEGATIVE

## 2024-02-24 LAB — MAGNESIUM: Magnesium: 2.4 mg/dL (ref 1.7–2.4)

## 2024-02-24 MED ORDER — FUROSEMIDE 10 MG/ML IJ SOLN
60.0000 mg | Freq: Once | INTRAMUSCULAR | Status: DC
  Filled 2024-02-24: qty 6

## 2024-02-24 MED ORDER — PROSOURCE TF20 ENFIT COMPATIBL EN LIQD
60.0000 mL | Freq: Three times a day (TID) | ENTERAL | Status: DC
  Administered 2024-02-24 – 2024-02-25 (×3): 60 mL
  Filled 2024-02-24: qty 60

## 2024-02-24 MED ORDER — MIDAZOLAM HCL 2 MG/2ML IJ SOLN
INTRAMUSCULAR | Status: AC
  Administered 2024-02-24: 2 mg via INTRAVENOUS
  Filled 2024-02-24: qty 2

## 2024-02-24 MED ORDER — FENTANYL 2500MCG IN NS 250ML (10MCG/ML) PREMIX INFUSION
0.0000 ug/h | INTRAVENOUS | Status: DC
  Administered 2024-02-24: 25 ug/h via INTRAVENOUS
  Filled 2024-02-24: qty 250

## 2024-02-24 MED ORDER — MAGNESIUM SULFATE 2 GM/50ML IV SOLN
2.0000 g | Freq: Once | INTRAVENOUS | Status: AC
  Administered 2024-02-24: 2 g via INTRAVENOUS
  Filled 2024-02-24: qty 50

## 2024-02-24 MED ORDER — CALCIUM GLUCONATE-NACL 1-0.675 GM/50ML-% IV SOLN
1.0000 g | Freq: Once | INTRAVENOUS | Status: AC
  Administered 2024-02-24: 1000 mg via INTRAVENOUS
  Filled 2024-02-24: qty 50

## 2024-02-24 MED ORDER — MIDAZOLAM HCL 2 MG/2ML IJ SOLN
2.0000 mg | INTRAMUSCULAR | Status: DC | PRN
  Administered 2024-02-25 (×2): 2 mg via INTRAVENOUS
  Filled 2024-02-24 (×2): qty 2

## 2024-02-24 MED ORDER — FENTANYL CITRATE (PF) 100 MCG/2ML IJ SOLN
100.0000 ug | Freq: Once | INTRAMUSCULAR | Status: AC
  Administered 2024-02-24: 100 ug via INTRAVENOUS

## 2024-02-24 MED ORDER — AMIODARONE HCL IN DEXTROSE 360-4.14 MG/200ML-% IV SOLN
60.0000 mg/h | INTRAVENOUS | Status: DC
  Administered 2024-02-24 – 2024-02-25 (×2): 60 mg/h via INTRAVENOUS
  Filled 2024-02-24 (×2): qty 200

## 2024-02-24 MED ORDER — METOPROLOL TARTRATE 25 MG/10 ML ORAL SUSPENSION
12.5000 mg | Freq: Two times a day (BID) | ORAL | Status: DC
  Filled 2024-02-24: qty 10

## 2024-02-24 MED ORDER — NOREPINEPHRINE 4 MG/250ML-% IV SOLN
INTRAVENOUS | Status: AC
  Administered 2024-02-24: 2 ug/min via INTRAVENOUS
  Filled 2024-02-24: qty 250

## 2024-02-24 MED ORDER — AMIODARONE HCL IN DEXTROSE 360-4.14 MG/200ML-% IV SOLN: 30.0000 mg/h | INTRAVENOUS | Status: DC

## 2024-02-24 MED ORDER — PROPOFOL 1000 MG/100ML IV EMUL
0.0000 ug/kg/min | INTRAVENOUS | Status: DC
  Administered 2024-02-24: 5 ug/kg/min via INTRAVENOUS
  Filled 2024-02-24: qty 100

## 2024-02-24 MED ORDER — METOPROLOL TARTRATE 5 MG/5ML IV SOLN
2.5000 mg | INTRAVENOUS | Status: AC
  Administered 2024-02-24: 2.5 mg via INTRAVENOUS
  Filled 2024-02-24: qty 5

## 2024-02-24 MED ORDER — MIDAZOLAM HCL 2 MG/2ML IJ SOLN: 2.0000 mg | Freq: Once | INTRAMUSCULAR | Status: AC

## 2024-02-24 MED ORDER — PROPOFOL 1000 MG/100ML IV EMUL
INTRAVENOUS | Status: AC
  Administered 2024-02-24: 5 ug/kg/min via INTRAVENOUS
  Filled 2024-02-24: qty 100

## 2024-02-24 MED ORDER — NOREPINEPHRINE 4 MG/250ML-% IV SOLN: 0.0000 ug/min | INTRAVENOUS | Status: DC

## 2024-02-24 MED ORDER — DOBUTAMINE-DEXTROSE 4-5 MG/ML-% IV SOLN
2.5000 ug/kg/min | INTRAVENOUS | Status: DC
  Filled 2024-02-24: qty 250

## 2024-02-24 MED ORDER — LACTATED RINGERS IV BOLUS
500.0000 mL | Freq: Once | INTRAVENOUS | Status: AC
  Administered 2024-02-24: 500 mL via INTRAVENOUS

## 2024-02-24 MED ORDER — PERFLUTREN LIPID MICROSPHERE
1.0000 mL | INTRAVENOUS | Status: AC | PRN
  Administered 2024-02-24: 3 mL via INTRAVENOUS

## 2024-02-24 MED ORDER — VITAL 1.5 CAL PO LIQD
1000.0000 mL | ORAL | Status: DC
  Administered 2024-02-24 (×2): 1000 mL

## 2024-02-24 MED ORDER — METOPROLOL TARTRATE 25 MG PO TABS
12.5000 mg | ORAL_TABLET | Freq: Two times a day (BID) | ORAL | Status: DC
  Administered 2024-02-24: 12.5 mg
  Filled 2024-02-24: qty 1

## 2024-02-24 MED ORDER — METOPROLOL TARTRATE 5 MG/5ML IV SOLN: 2.5000 mg | INTRAVENOUS | Status: DC

## 2024-02-24 MED ORDER — AMIODARONE LOAD VIA INFUSION
150.0000 mg | Freq: Once | INTRAVENOUS | Status: AC
  Administered 2024-02-24: 150 mg via INTRAVENOUS
  Filled 2024-02-24: qty 83.34

## 2024-02-24 MED ORDER — METOPROLOL TARTRATE 5 MG/5ML IV SOLN
5.0000 mg | Freq: Once | INTRAVENOUS | Status: AC
  Administered 2024-02-24: 5 mg via INTRAVENOUS
  Filled 2024-02-24: qty 5

## 2024-02-24 NOTE — Progress Notes (Signed)
  Updated pts sister Addie Solecki at bedside regarding pts condition and current plan of care.  All questions answered.    Lonell Moose, AGNP  Pulmonary/Critical Care Pager [phone] (please enter 7 digits) PCCM Consult Pager [phone] (please enter 7 digits)

## 2024-02-24 NOTE — Progress Notes (Signed)
 PHARMACY - ANTICOAGULATION CONSULT NOTE  Pharmacy Consult for Heparin  Infusion Indication: possible LV thrombus  No Known Allergies  Patient Measurements: Height: 6' (182.9 cm) Weight: 97 kg (213 lb 13.5 oz) IBW/kg (Calculated) : 77.6  Vital Signs: Temp: 98.6 F (37 C) (06/30 0030) Temp Source: Bladder (06/30 0000) BP: 145/55 (06/30 0030) Pulse Rate: 50 (06/30 0030)  Labs: Recent Labs    02/21/24 0420 02/22/24 0444 02/23/24 0422 02/23/24 1518 02/24/24 0017  HGB 12.8* 12.9* 12.0*  --   --   HCT 37.1* 37.4* 35.7*  --   --   PLT 311 286 253  --   --   LABPROT 30.7*  --   --   --   --   INR 2.8*  --   --   --   --   HEPARINUNFRC  --   --   --   --  0.62  CREATININE 2.24* 1.77* 1.21 1.11  --   TROPONINIHS 2,304*  --   --   --   --     Estimated Creatinine Clearance: 73.7 mL/min (by C-G formula based on SCr of 1.11 mg/dL).   Medical History: Past Medical History:  Diagnosis Date   Biventricular failure (HCC)    CVA (cerebral vascular accident) (HCC)    Polysubstance use disorder    Schizophrenia (HCC)    Assessment: Patient is 73yo male with a possible LV thrombus. Pharmacy consulted for Heparin  dosing. Patient was not initially started on Heparin  due to concern for DIC, but now improved. Patient did receive Enoxaparin  40mg  ~12:00.  Goal of Therapy:  Heparin  level 0.3-0.7 units/ml Monitor platelets by anticoagulation protocol: Yes  06/30 0017 HL 0.62, therapeutic x 1   Plan:  Continue heparin  infusion at 1550 units/hr Recheck HL in 8 hrs to confirm, then daily CBC daily while on heparin   Rankin CANDIE Dills, PharmD, [facility] 02/24/2024 1:13 AM

## 2024-02-24 NOTE — Progress Notes (Signed)
 2000 - Pt noted to throw foot off the side of the bed. As this nurse was entering the room, pt noted to grab at ETT. RT at bedside s/p assessment/suctioning. HR 140's-150's, tachypenic. Pt agitated and thrashing in the bed. Provider notified. Fentanyl  100mcg x1 and Versed 2mg  x1 ordered. EKG obtained showing prolonged QTC and multifocal PVC. Magnesium  2g IV x1, calcium  gluconate 1g IV x1, metoprolol  5mg  IV x1, and PRN versed ordered.   2042 - E. Ouma, NP notified of HR 140-153 about 20 minutes s/p administration of IV Metoprolol . Per E. Ouma, NP - administer PO Metoprolol  now. No additional orders received.   2218 - Pt hypotensive. Levophed  and Dobutamine  ordered. Plan to start Levo and F/U w/ provider before starting Dobutamine .  2340 - Amio bolus and drip ordered after E. Kathrene, NP reportedly spoke w/ on call STEMI provider. Spoke w/ E. Ouma, NP regarding Dobutamine . Pt's heart rate and BP improved w/ Amio and Levo drips. Verbal order received to D/C Dobutamine .

## 2024-02-24 NOTE — Plan of Care (Addendum)
 At the initial part of the shift the patient's heart rate dropped to 48 beats for minute and ICU providers were notified. The patient was being monitored.Around 11 AM the patients heart rate started to trend up and it went up to 140's to 150's beats per minute. 500 mL bolus of LR was given twice. The patient went down for and MRI today. Propofol  was imitated for MRI procedure and was turned off upon the patient returning to the ICU. Tube feeds rate has been modified to 45 ml/hr. Currently the Patient is only on heparin  drip at 1400 units/h. Current oral secretions have been higher. The patient still withdraws to pain.  Problem: Education: Goal: Knowledge of General Education information will improve Description: Including pain rating scale, medication(s)/side effects and non-pharmacologic comfort measures Outcome: Not Progressing   Problem: Health Behavior/Discharge Planning: Goal: Ability to manage health-related needs will improve Outcome: Not Progressing   Problem: Clinical Measurements: Goal: Ability to maintain clinical measurements within normal limits will improve Outcome: Not Progressing Goal: Will remain free from infection Outcome: Not Progressing Goal: Diagnostic test results will improve Outcome: Not Progressing Goal: Respiratory complications will improve Outcome: Not Progressing Goal: Cardiovascular complication will be avoided Outcome: Not Progressing   Problem: Activity: Goal: Risk for activity intolerance will decrease Outcome: Not Progressing   Problem: Nutrition: Goal: Adequate nutrition will be maintained Outcome: Not Progressing   Problem: Coping: Goal: Level of anxiety will decrease Outcome: Not Progressing   Problem: Coping: Goal: Level of anxiety will decrease Outcome: Not Progressing   Problem: Elimination: Goal: Will not experience complications related to bowel motility Outcome: Not Progressing Goal: Will not experience complications related to  urinary retention Outcome: Not Progressing   Problem: Pain Managment: Goal: General experience of comfort will improve and/or be controlled Outcome: Not Progressing   Problem: Pain Managment: Goal: General experience of comfort will improve and/or be controlled Outcome: Not Progressing   Problem: Safety: Goal: Ability to remain free from injury will improve Outcome: Not Progressing   Problem: Skin Integrity: Goal: Risk for impaired skin integrity will decrease Outcome: Not Progressing   Problem: Education: Goal: Ability to manage disease process will improve Outcome: Not Progressing   Problem: Education: Goal: Ability to manage disease process will improve Outcome: Not Progressing   Problem: Cardiac: Goal: Ability to achieve and maintain adequate cardiopulmonary perfusion will improve Outcome: Not Progressing   Problem: Neurologic: Goal: Promote progressive neurologic recovery Outcome: Not Progressing   Problem: Skin Integrity: Goal: Risk for impaired skin integrity will be minimized. Outcome: Not Progressing

## 2024-02-24 NOTE — Progress Notes (Signed)
 Nutrition Follow-up  DOCUMENTATION CODES:   Non-severe (moderate) malnutrition in context of social or environmental circumstances  INTERVENTION:   Vital 1.5@45ml /hr + ProSource TF 20- Give 60ml TID via tube  Free water  flushes 30ml q4 hours to maintain tube patency   Regimen provides 1860kcal/day, 133g/day protein and 1073ml/day of fluid.   MVI daily via tube  Thiamine  100mg  daily via tube  Pt at high refeed risk; recommend monitor potassium, magnesium  and phosphorus labs daily until stable  Daily weights   NUTRITION DIAGNOSIS:   Moderate Malnutrition related to social / environmental circumstances as evidenced by mild fat depletion, mild muscle depletion, edema. -ongoing   GOAL:   Provide needs based on ASPEN/SCCM guidelines -met   MONITOR:   Vent status, Labs, Weight trends, TF tolerance, I & O's, Skin  ASSESSMENT:   73 y/o male with h/o CHF, dilated cardiomyopathy, substance abuse, schizophrenia, CVA, depression, HCV, homelessness and recently in jail who is admitted with PEA arrest, AKI, CHF with bilateral pleural effusions, thrombus, shock, H influenza and strep pneumoniae and sepsis.  Pt remains sedated and ventilated. Pt is tolerating tube feeds well at goal rate. Refeed labs wnl. Pt is having bowel function. Per chart, pt is down 13lbs since admission but remains up ~19lbs from his UBW. Edema improved on exam today.   Medications reviewed and include: colace, lasix , MVI, protonix , miralax , thiamine , ceftriaxone, heparin   Labs reviewed: K 4.3 wnl, BUN 52(H), P 2.8 wnl Mg 2.4 wnl- 6/29 Folate > 40 wnl, B12 7412(H)- 6/26 Wbc- 20.9(H) Cbgs- 112, 119, 97 x 24 hrs   Patient is currently intubated on ventilator support MV: 8.4 L/min Temp (24hrs), Avg:98.3 F (36.8 C), Min:97.3 F (36.3 C), Max:99 F (37.2 C)  MAP >48mmHg   UOP-   NUTRITION - FOCUSED PHYSICAL EXAM:  Flowsheet Row Most Recent Value  Orbital Region Moderate depletion  Upper Arm  Region Moderate depletion  Thoracic and Lumbar Region Mild depletion  Buccal Region Moderate depletion  Temple Region Severe depletion  Clavicle Bone Region Severe depletion  Clavicle and Acromion Bone Region Severe depletion  Scapular Bone Region Mild depletion  Dorsal Hand Unable to assess  Patellar Region Moderate depletion  Anterior Thigh Region Moderate depletion  Posterior Calf Region Severe depletion  Edema (RD Assessment) Mild  Hair Reviewed  Eyes Reviewed  Mouth Reviewed  Skin Reviewed  Nails Reviewed   Diet Order:   Diet Order             Diet NPO time specified  Diet effective now                  EDUCATION NEEDS:   Not appropriate for education at this time  Skin:  Skin Assessment: Reviewed RN Assessment  Last BM:  6/30- type 6  Height:   Ht Readings from Last 1 Encounters:  02/21/24 6' (1.829 m)    Weight:   Wt Readings from Last 1 Encounters:  02/24/24 92.4 kg    Ideal Body Weight:  80.9 kg  BMI:  Body mass index is 27.63 kg/m.  Estimated Nutritional Needs:   Kcal:  1833kcal/day  Protein:  125-140g/day  Fluid:  2.0L/day  Augustin Shams MS, RD, LDN If unable to be reached, please send secure chat to RD inpatient available from 8:00a-4:00p daily

## 2024-02-24 NOTE — Progress Notes (Signed)
  Attempted to update pts sister Jeancarlos Marchena via telephone regarding pts condition and current plan of care.  However, she did not answer the telephone, left HIPAA appropriate voicemail instructing her to return my phone call.    Lonell Moose, AGNP  Pulmonary/Critical Care Pager [phone] (please enter 7 digits) PCCM Consult Pager [phone] (please enter 7 digits)

## 2024-02-24 NOTE — Progress Notes (Signed)
 PHARMACY CONSULT NOTE - FOLLOW UP  Pharmacy Consult for Electrolyte Monitoring and Replacement   Recent Labs: Potassium (mmol/L)  Date Value  02/24/2024 4.3  09/15/2012 4.4   Magnesium  (mg/dL)  Date Value  93/70/7974 2.4   Calcium  (mg/dL)  Date Value  93/69/7974 7.9 (L)   Calcium , Total (mg/dL)  Date Value  98/79/7985 9.4   Albumin (g/dL)  Date Value  93/69/7974 2.6 (L)  09/15/2012 4.2   Phosphorus (mg/dL)  Date Value  93/69/7974 2.8   Sodium (mmol/L)  Date Value  02/24/2024 141  09/15/2012 146 (H)     Assessment: 73 y.o. male w/ PMH of CHF, HTN, CVA, schizophrenia admitted on 02/20/2024 with cardiac arrest. Pharmacy is asked to follow and replace electrolytes while in CCU   Free water  30 ml q4H.   Goal of Therapy:  Electrolytes WNL  Plan:  No replacement needed. Electrolytes stable.  F/u with AM labs  Idolina DELENA Percy ,PharmD Clinical Pharmacist 02/24/2024 7:36 AM

## 2024-02-24 NOTE — Progress Notes (Signed)
 PHARMACY - ANTICOAGULATION CONSULT NOTE  Pharmacy Consult for Heparin  Infusion Indication: possible LV thrombus  No Known Allergies  Patient Measurements: Height: 6' (182.9 cm) Weight: 92.4 kg (203 lb 11.3 oz) IBW/kg (Calculated) : 77.6  Vital Signs: Temp: 97.9 F (36.6 C) (06/30 0730) Temp Source: Bladder (06/30 0000) BP: 134/54 (06/30 0730) Pulse Rate: 52 (06/30 0730)  Labs: Recent Labs    02/22/24 0444 02/23/24 0422 02/23/24 1518 02/24/24 0017 02/24/24 0354 02/24/24 0821  HGB 12.9* 12.0*  --   --  11.9*  --   HCT 37.4* 35.7*  --   --  35.8*  --   PLT 286 253  --   --  238  --   HEPARINUNFRC  --   --   --  0.62  --  0.73*  CREATININE 1.77* 1.21 1.11  --  1.04  --     Estimated Creatinine Clearance: 71.5 mL/min (by C-G formula based on SCr of 1.04 mg/dL).   Medical History: Past Medical History:  Diagnosis Date   Biventricular failure (HCC)    CVA (cerebral vascular accident) (HCC)    Polysubstance use disorder    Schizophrenia (HCC)    Assessment: Patient is 73yo male with a possible LV thrombus. Pharmacy consulted for Heparin  dosing. Patient was not initially started on Heparin  due to concern for DIC, but now improved. Patient did receive Enoxaparin  40mg  ~12:00.  Goal of Therapy:  Heparin  level 0.3-0.7 units/ml Monitor platelets by anticoagulation protocol: Yes  06/30 0017 HL 0.62, therapeutic x 1 06/30 0821 HL 0.73, SUPRAtherapeutic   Plan:  Decrease heparin  infusion to 1450 units/hr Recheck HL in 8 hrs to confirm, then daily CBC daily while on heparin   Anesa Fronek A Leidi Astle, PharmD Clinical Pharmacist 02/24/2024 9:03 AM

## 2024-02-24 NOTE — Progress Notes (Signed)
 PHARMACY - ANTICOAGULATION CONSULT NOTE  Pharmacy Consult for Heparin  Infusion Indication: possible LV thrombus  No Known Allergies  Patient Measurements: Height: 6' (182.9 cm) Weight: 92.4 kg (203 lb 11.3 oz) IBW/kg (Calculated) : 77.6  Vital Signs: Temp: 97.9 F (36.6 C) (06/30 1745) Temp Source: Bladder (06/30 0815) BP: 113/60 (06/30 1745) Pulse Rate: 36 (06/30 1745)  Labs: Recent Labs    02/22/24 0444 02/23/24 0422 02/23/24 1518 02/24/24 0017 02/24/24 0354 02/24/24 0821 02/24/24 1725  HGB 12.9* 12.0*  --   --  11.9*  --   --   HCT 37.4* 35.7*  --   --  35.8*  --   --   PLT 286 253  --   --  238  --   --   HEPARINUNFRC  --   --   --  0.62  --  0.73* 0.69  CREATININE 1.77* 1.21 1.11  --  1.04  --   --     Estimated Creatinine Clearance: 71.5 mL/min (by C-G formula based on SCr of 1.04 mg/dL).   Medical History: Past Medical History:  Diagnosis Date   Biventricular failure (HCC)    CVA (cerebral vascular accident) (HCC)    Polysubstance use disorder    Schizophrenia (HCC)    Assessment: Patient is 73yo male with a possible LV thrombus. Pharmacy consulted for Heparin  dosing. Patient was not initially started on Heparin  due to concern for DIC, but now improved. Patient did receive Enoxaparin  40mg  ~12:00.  Goal of Therapy:  Heparin  level 0.3-0.7 units/ml Monitor platelets by anticoagulation protocol: Yes  06/30 0017 HL 0.62, therapeutic x 1 06/30 0821 HL 0.73, SUPRAtherapeutic 06/30 1725 HL 0.69, therapeutic x1   Plan:  Heparin  level is therapeutic but at top of goal range at 0.69 on 1450 units/hour Will reduce heparin  infusion rate slightly to 1400 units/hour to keep patient in goal range and check a confirmatory level in 8 hours Monitor heparin  levels daily while on heparin  infusion CBC daily while on heparin   Thank you for involving pharmacy in this patient's care.   Damien Napoleon, PharmD Clinical Pharmacist 02/24/2024 5:59 PM

## 2024-02-24 NOTE — Progress Notes (Signed)
 NAME:  Miguel [name], MRN:  [id number], DOB:  1950-09-08, LOS: 4 ADMISSION DATE:  02/20/2024, CONSULTATION DATE: 02/20/2024 REFERRING MD: Dr. Levander, CHIEF COMPLAINT: Cardiac Arrest    History of Present Illness:  This is a 73 yo male who presented to [facility] ER [facility] EMS from jail on 06/26 post cardiac arrest.  Per ER notes pts cellmate heard the pt gurgling this morning.  He was found pulseless with initial cardiac rhythm asystole.  EMS administered 3 doses of epi/2 mg of narcan .  Following administration of narcan  ROSC achieved.  Estimated downtime 15 minutes.  EMS reported en route to the ER pt became agitated and attempted to remove Igel.  EMS administered versed.  Pt became pulseless again en route ACLS protocol initiated.  ED Course  Upon arrival to the ER pt remained pulseless in PEA.  ACLS protocol continued pt received: 2 mg of epi/4 mg of iv narcan /1 amp of bicarb/1 g of calcium .  Igel exchanged for an ETT with ongoing CPR (pt did not require RSI medications for intubation).  ROSC achieved with estimated downtime during 2nd cardiac arrest 15 minutes.  CTA Chest negative for PE.  Significant lab results were: Na+ 129/K+ 5.3/chloride 90/glucose 110/BUN 49/creatinine 2.22/calcium  7.5/mag 3.2/alk phos 134/AST 3,301/ALT 2,038/total bilirubin 5.5/troponin 344/lactic acid 8.1/urine drug screen negative/UA concerning for possible UTI.  Sepsis protocol initiated pt to receive: cefepime /metronidazole /vancomycin .  Pt found to be severely hypoglycemic CBG 17 requiring 1 amp of D50W.  Levophed  gtt initiated due to hypotension.  PCCM team contacted for ICU admission.  Pt remains in police custody with bilateral ankle cuffs in place, and police officer at bedside.   Pertinent  Medical History  Schizophrenia  Depression Current Smoker  Chronic Systolic CHF (Echo 09/11/22: EF <20%, grade II diastolic dysfunction, trivial pericardial effusion, mild mitral valve regurgitation, trivial tricuspid valve  regurgitation, trivial pulmonic valve regurgitation) CVA with aphasia  Micro Data:   MRSA PCR 06/26>>negative  COVID/Influenza A&B/RSV 06/26>>negative  RVP 06/26>>negative  Blood 06/26>>NGTD  Tracheal aspirate 06/26>>H influenza and strep pneumoniae  Urine 06/26>>less than 10,000 colonies/ml insignificant growth  Legionella pneum ur ag 06/26>>negative   Anti-infectives (From admission, onward)    Start     Dose/Rate Route Frequency Ordered Stop   02/22/24 1100  cefTRIAXone (ROCEPHIN) 2 g in sodium chloride  0.9 % 100 mL IVPB        2 g 200 mL/hr over 30 Minutes Intravenous Every 24 hours 02/22/24 0952     02/22/24 1100  azithromycin  (ZITHROMAX ) 500 mg in sodium chloride  0.9 % 250 mL IVPB        500 mg 250 mL/hr over 60 Minutes Intravenous Every 24 hours 02/22/24 0952 02/27/24 1059   02/21/24 0000  ceFEPIme  (MAXIPIME ) 2 g in sodium chloride  0.9 % 100 mL IVPB  Status:  Discontinued        2 g 200 mL/hr over 30 Minutes Intravenous Every 12 hours 02/20/24 1051 02/22/24 0952   02/20/24 1200  vancomycin  (VANCOREADY) IVPB 2000 mg/400 mL        2,000 mg 200 mL/hr over 120 Minutes Intravenous  Once 02/20/24 1007 02/20/24 1400   02/20/24 1050  vancomycin  variable dose per unstable renal function (pharmacist dosing)  Status:  Discontinued         Does not apply See admin instructions 02/20/24 1051 02/21/24 0925   02/20/24 0845  ceFEPIme  (MAXIPIME ) 2 g in sodium chloride  0.9 % 100 mL IVPB        2 g  200 mL/hr over 30 Minutes Intravenous  Once 02/20/24 0838 02/20/24 1410   02/20/24 0845  metroNIDAZOLE  (FLAGYL ) IVPB 500 mg        500 mg 100 mL/hr over 60 Minutes Intravenous  Once 02/20/24 0838 02/20/24 1559   02/20/24 0845  vancomycin  (VANCOCIN ) IVPB 1000 mg/200 mL premix  Status:  Discontinued        1,000 mg 200 mL/hr over 60 Minutes Intravenous  Once 02/20/24 0838 02/20/24 1007      Significant Hospital Events: Including procedures, antibiotic start and stop dates in addition to other  pertinent events   06/26: Pt admitted post cardiac arrest mechanically intubated with possible cardiogenic/possible septic shock with multiorgan failure requiring levophed  and dobutamine  gtts 06/27: on minimal sedation, discontinued this AM. Off nor-epi, continued on dobutamine  06/28: decreased dobutamine  to 2.5, remains off sedation 06/29: arouses to tactile and verbal stimuli, remains disoriented. Failed SBT this AM. Dobutamine  increased to 5 mcg overnight due to bradycardia 06/30: Pt remains mechanically intubated, however neuro exam precludes extubation.  He has not received sedation since 06/27. Pending MRI Brain today.  Dobutamine  discontinued   Interim History / Subjective:  Pt unable to follow commands or withdraw from painful stimulation in LUE, but able to withdraw from painful stimulation in RUE and BUE.    Objective    Blood pressure (!) 134/54, pulse (!) 52, temperature 97.9 F (36.6 C), resp. rate 17, height 6' (1.829 m), weight 92.4 kg, SpO2 98%.    Vent Mode: PSV;CPAP FiO2 (%):  [40 %] 40 % Set Rate:  [20 bmp] 20 bmp Vt Set:  [500 mL-5000 mL] 500 mL PEEP:  [8 cmH20] 8 cmH20 Pressure Support:  [8 cmH20-10 cmH20] 8 cmH20 Plateau Pressure:  [17 cmH20-20 cmH20] 20 cmH20   Intake/Output Summary (Last 24 hours) at 02/24/2024 0912 Last data filed at 02/24/2024 0730 Gross per 24 hour  Intake 1845.27 ml  Output 3510 ml  Net -1664.73 ml   Filed Weights   02/22/24 0438 02/23/24 0415 02/24/24 0400  Weight: 94.8 kg 97 kg 92.4 kg    Examination: General: Acute on chronically-ill appearing male, NAD mechanically intubated  HENT: Very poor dentition, mild JVD  Lungs: Rhonchi throughout, even, non labored  Cardiovascular: Sinus rhythm, s1s2, no m/r/g, 1+ radial/1+ distal pulses, 1+ generalized edema  Abdomen: +BS x4, soft, non distended  Extremities: Normal bulk and tone Skin: Bilateral ankle cuffs present without signs of skin breakdown, no pressure injuries present on  admission  Neuro: Unable to follow commands, unable withdraw from painful stimulation in LUE, but able to withdraw from painful stimulation in RUE and BUE.  bilateral pupils 2 mm sluggish GU: Indwelling foley catheter draining yellow urine   Resolved problem list  Hyponatremia  Lactic acidosis  Hyperkalemia   Assessment and Plan   #Acute metabolic encephalopathy  #Mechanical intubation pain/discomfort Hx: Polysubstance abuse, depression, and schizoaffective disorder CT Head 02/20/24: There is of focal area of abnormal density within the left frontal lobe, which could represent a subacute cortical infarct. Correlation with MRI of the brain is suggested. Interval development of a focal area of diminished attenuation within the subcortical white matter of the right occipital lobe, possibly representing a chronic subcortical infarct. This also could be better assessed with MRI. EEG 02/21/24: Study is suggestive of severe to profound diffuse encephalopathy. No seizures or epileptiform discharges were seen throughout the recording. - Urine drug screen negative  - Correct metabolic derangements  - MRI Brain pending  - Maintain RASS  goal of 0  - WUA daily  - Avoid sedation medication as able   #Cardiac arrest (PEA)  #Cardiogenic/possible septic shock~resolved   #Acute on chronic systolic CHF #Elevated troponin suspect secondary to demand ischemia  #Possible mural thrombus  Hx: CVA with aphasia  CTA Chest PE 02/20/24: No evidence of acute pulmonary embolism. Rounded filling defect within the right atrial appendage, suspicious for thrombus. Echo 02/20/24: EF 20 to 25%, possible mural thrombus  - Continuous telemetry monitoring  - Troponin peaked at 2,551  - Prn levophed  gtt to maintain map 65 or higher  - Hold outpatient diuretics, antihypertensives, and beta-blockers for now  - Repeat Echo pending to reassess thrombus  - Will administer 60 mg iv lasix  x1 dose - Dobutamine  stopped will  check coox @1600  - CVP pending  - Heparin  gtt initiated on 06/29 due to DIC improving: dosing per pharmacy - Cardiology consulted appreciate input: will consider cardiac cath pending meaningful neurological recovery   #Acute respiratory failure  #Bilateral pleural effusions right greater than left  #H influenza and strep pneumoniae  #Mechanical intubation  Hx: Tobacco abuse  - Full vent support for now: vent settings reviewed and established  - Continue lung protective strategies  - Maintain plateau pressures less than 30 cm H2O - VAP bundle implemented  - SBT once all parameters met: mentation precluding extubation at this time  - Intermittent CXR's and ABG's  - Prn bronchodilator therapy   #Acute kidney injury secondary to ATN~resolved   - Trend BMP  - Replace electrolytes as indicated  - Strict I&O's - Avoid nephrotic agents as able   #Shock liver  #Elevated alk phos and total bilirubin  CT Abd/Pelvis 06/26: Mild diffuse mural thickening, which may be seen in the setting of hypoperfusion. Small volume free fluid. HCV ab 06/26: reactive  - Trend hepatic function panel - Avoid hepatotoxic agents as able   #Sepsis  #H influenza and strep pneumoniae  - Trend WBC and monitor fever curve  - Trend PCT  - Follow cultures  - Continue abx as outlined above   #Anemia without signs of bleeding  - Trend CBC  - Monitor for s/sx of bleeding  - Transfuse fo rhgb <7 - Folic acid and vitamin B12 levels pending   #Hypoglycemia  - CBG's q4hrs for now  - Follow hypo/hyperglycemic protocol - Target CBG range 140 to 180  Best Practice (right click and Reselect all SmartList Selections daily)   Diet/type: TF's DVT prophylaxis systemic heparin  Pressure ulcer(s): N/A GI prophylaxis: PPI Lines: Central line Foley:  Yes, and it is still needed Code Status:  full code Last date of multidisciplinary goals of care discussion [02/24/24]  Will update pts family regarding pts condition  and current plan of care  Labs   CBC: Recent Labs  Lab 02/20/24 0751 02/20/24 1223 02/20/24 2256 02/21/24 0420 02/22/24 0444 02/23/24 0422 02/24/24 0354  WBC 14.7* 18.0* 17.7* 19.3* 20.2* 23.6* 20.9*  NEUTROABS 12.7* 16.5* 15.9*  --   --   --   --   HGB 11.9* 12.8* 12.1* 12.8* 12.9* 12.0* 11.9*  HCT 37.3* 39.7 36.2* 37.1* 37.4* 35.7* 35.8*  MCV 102.5* 97.5 95.3 93.2 91.9 93.7 95.5  PLT 355 351 311 311 286 253 238    Basic Metabolic Panel: Recent Labs  Lab 02/20/24 0751 02/20/24 1223 02/20/24 1651 02/20/24 1745 02/21/24 0420 02/22/24 0444 02/23/24 0422 02/23/24 1518 02/24/24 0354  NA 129*   < >  --    < >  131* 133* 137 138 141  K 5.3*   < >  --    < > 4.2 4.1 4.1 4.1 4.3  CL 90*   < >  --    < > 94* 94* 97* 100 98  CO2 25   < >  --    < > 26 29 32 30 33*  GLUCOSE 110*   < >  --    < > 86 115* 106* 111* 117*  BUN 49*   < >  --    < > 60* 68* 59* 53* 52*  CREATININE 2.22*   < >  --    < > 2.24* 1.77* 1.21 1.11 1.04  CALCIUM  7.5*   < >  --    < > 7.7* 7.7* 7.6* 7.4* 7.9*  MG 3.2*  --   --   --  2.5* 2.7* 2.8* 2.4  --   PHOS  --    < > 5.6*  --  3.5 3.4 2.6  --  2.8   < > = values in this interval not displayed.   GFR: Estimated Creatinine Clearance: 71.5 mL/min (by C-G formula based on SCr of 1.04 mg/dL). Recent Labs  Lab 02/20/24 2028 02/20/24 2256 02/21/24 0420 02/22/24 0444 02/22/24 0802 02/23/24 0422 02/24/24 0354  WBC  --  17.7* 19.3* 20.2*  --  23.6* 20.9*  LATICACIDVEN 5.7* 4.0* 2.1*  --  1.3  --   --     Liver Function Tests: Recent Labs  Lab 02/20/24 0751 02/21/24 0420 02/22/24 0444 02/23/24 0422 02/24/24 0354  AST 3,301* 2,959*  --   --   --   ALT 2,038* 2,111*  --   --   --   ALKPHOS 134* 119  --   --   --   BILITOT 5.5* 4.9*  --   --   --   PROT 5.7* 5.2*  --   --   --   ALBUMIN 2.7* 2.6* 2.4* 2.5* 2.6*   No results for input(s): LIPASE, AMYLASE in the last 168 hours. No results for input(s): AMMONIA in the last 168  hours.  ABG    Component Value Date/Time   PHART 7.35 02/20/2024 1452   PCO2ART 45 02/20/2024 1452   PO2ART 59 (L) 02/20/2024 1452   HCO3 24.8 02/20/2024 1452   ACIDBASEDEF 1.1 02/20/2024 1452   O2SAT 77.2 02/23/2024 1405     Coagulation Profile: Recent Labs  Lab 02/20/24 1344 02/20/24 2256 02/21/24 0420  INR 3.4* 2.5* 2.8*    Cardiac Enzymes: No results for input(s): CKTOTAL, CKMB, CKMBINDEX, TROPONINI in the last 168 hours.  HbA1C: Hgb A1c MFr Bld  Date/Time Value Ref Range Status  07/14/2018 04:29 AM 5.8 (H) 4.8 - 5.6 % Final    Comment:    (NOTE) Pre diabetes:          5.7%-6.4% Diabetes:              >6.4% Glycemic control for   <7.0% adults with diabetes   07/13/2018 04:25 AM 6.2 (H) 4.8 - 5.6 % Final    Comment:    (NOTE) Pre diabetes:          5.7%-6.4% Diabetes:              >6.4% Glycemic control for   <7.0% adults with diabetes     CBG: Recent Labs  Lab 02/23/24 1600 02/23/24 1935 02/24/24 0011 02/24/24 0352 02/24/24 0722  GLUCAP 129* 106* 97 119* 112*  Review of Systems:   Unable to assess pt mechanically intubated   Past Medical History:  He,  has a past medical history of Biventricular failure (HCC), CVA (cerebral vascular accident) (HCC), Polysubstance use disorder, and Schizophrenia (HCC).   Surgical History:   Past Surgical History:  Procedure Laterality Date   APPENDECTOMY       Social History:   reports that he has never smoked. He has never used smokeless tobacco. He reports that he does not drink alcohol and does not use drugs.   Family History:  His Family history is unknown by patient.   Allergies No Known Allergies   Home Medications  Prior to Admission medications   Medication Sig Start Date End Date Taking? Authorizing Provider  ARIPiprazole  (ABILIFY ) 5 MG tablet Take 1 tablet (5 mg total) by mouth daily. 09/17/22   Jens Durand, MD  ARISTADA 882 MG/3.2ML prefilled syringe Inject 882 mg into the  muscle every 30 (thirty) days. 08/23/22   [provider]  atorvastatin  (LIPITOR) 40 MG tablet Take 1 tablet (40 mg total) by mouth daily at 6 PM. 07/15/18   Laurence Bridegroom, MD  divalproex  (DEPAKOTE ) 500 MG DR tablet Take 1 tablet (500 mg total) by mouth every 12 (twelve) hours. 09/17/22   Jens Durand, MD  furosemide  (LASIX ) 40 MG tablet Take 1 tablet (40 mg total) by mouth daily. 06/19/22 06/19/23  Arlander Charleston, MD  lisinopril  (ZESTRIL ) 10 MG tablet Take 1 tablet (10 mg total) by mouth daily. 09/17/22   Jens Durand, MD  metoprolol  succinate (TOPROL -XL) 25 MG 24 hr tablet Take 1 tablet (25 mg total) by mouth daily. 09/17/22   Jens Durand, MD  ondansetron  (ZOFRAN -ODT) 4 MG disintegrating tablet Take 1 tablet (4 mg total) by mouth every 6 (six) hours as needed for nausea or vomiting. 04/27/23   Ward, Josette SAILOR, DO  pantoprazole  (PROTONIX ) 40 MG tablet Take 1 tablet (40 mg total) by mouth daily. 04/27/23 04/26/24  Ward, Josette SAILOR, DO     Critical care time: 42 minutes      Lonell Moose, AGNP  Pulmonary/Critical Care Pager [phone] (please enter 7 digits) PCCM Consult Pager [phone] (please enter 7 digits)

## 2024-02-24 NOTE — Plan of Care (Signed)
  Problem: Clinical Measurements: Goal: Will remain free from infection Outcome: Not Progressing Goal: Respiratory complications will improve Outcome: Not Progressing Goal: Cardiovascular complication will be avoided Outcome: Not Progressing   Problem: Nutrition: Goal: Adequate nutrition will be maintained Outcome: Progressing   Problem: Elimination: Goal: Will not experience complications related to bowel motility Outcome: Progressing Goal: Will not experience complications related to urinary retention Outcome: Progressing   Problem: Pain Managment: Goal: General experience of comfort will improve and/or be controlled Outcome: Progressing   Problem: Safety: Goal: Ability to remain free from injury will improve Outcome: Progressing   Problem: Skin Integrity: Goal: Risk for impaired skin integrity will decrease Outcome: Progressing   Problem: Education: Goal: Ability to manage disease process will improve Outcome: Not Progressing   Problem: Cardiac: Goal: Ability to achieve and maintain adequate cardiopulmonary perfusion will improve Outcome: Progressing   Problem: Neurologic: Goal: Promote progressive neurologic recovery Outcome: Not Progressing

## 2024-02-25 DIAGNOSIS — I469 Cardiac arrest, cause unspecified: Secondary | ICD-10-CM

## 2024-02-25 DIAGNOSIS — Z7189 Other specified counseling: Secondary | ICD-10-CM | POA: Diagnosis not present

## 2024-02-25 DIAGNOSIS — B963 Hemophilus influenzae [H. influenzae] as the cause of diseases classified elsewhere: Secondary | ICD-10-CM | POA: Diagnosis not present

## 2024-02-25 DIAGNOSIS — R001 Bradycardia, unspecified: Secondary | ICD-10-CM

## 2024-02-25 DIAGNOSIS — G9341 Metabolic encephalopathy: Secondary | ICD-10-CM | POA: Diagnosis not present

## 2024-02-25 DIAGNOSIS — I5023 Acute on chronic systolic (congestive) heart failure: Secondary | ICD-10-CM | POA: Diagnosis not present

## 2024-02-25 LAB — GLUCOSE, CAPILLARY
Glucose-Capillary: 101 mg/dL — ABNORMAL HIGH (ref 70–99)
Glucose-Capillary: 108 mg/dL — ABNORMAL HIGH (ref 70–99)
Glucose-Capillary: 122 mg/dL — ABNORMAL HIGH (ref 70–99)
Glucose-Capillary: 143 mg/dL — ABNORMAL HIGH (ref 70–99)
Glucose-Capillary: 82 mg/dL (ref 70–99)
Glucose-Capillary: 89 mg/dL (ref 70–99)
Glucose-Capillary: 93 mg/dL (ref 70–99)

## 2024-02-25 LAB — HEPATIC FUNCTION PANEL
ALT: 564 U/L — ABNORMAL HIGH (ref 0–44)
AST: 146 U/L — ABNORMAL HIGH (ref 15–41)
Albumin: 2.4 g/dL — ABNORMAL LOW (ref 3.5–5.0)
Alkaline Phosphatase: 84 U/L (ref 38–126)
Bilirubin, Direct: 0.6 mg/dL — ABNORMAL HIGH (ref 0.0–0.2)
Indirect Bilirubin: 0.9 mg/dL (ref 0.3–0.9)
Total Bilirubin: 1.5 mg/dL — ABNORMAL HIGH (ref 0.0–1.2)
Total Protein: 5.3 g/dL — ABNORMAL LOW (ref 6.5–8.1)

## 2024-02-25 LAB — CULTURE, BLOOD (ROUTINE X 2)
Culture: NO GROWTH
Culture: NO GROWTH
Culture: NO GROWTH
Special Requests: ADEQUATE

## 2024-02-25 LAB — BASIC METABOLIC PANEL WITH GFR
Anion gap: 8 (ref 5–15)
BUN: 52 mg/dL — ABNORMAL HIGH (ref 8–23)
CO2: 32 mmol/L (ref 22–32)
Calcium: 8.2 mg/dL — ABNORMAL LOW (ref 8.9–10.3)
Chloride: 103 mmol/L (ref 98–111)
Creatinine, Ser: 0.89 mg/dL (ref 0.61–1.24)
GFR, Estimated: >60 mL/min (ref >60)
Glucose, Bld: 117 mg/dL — ABNORMAL HIGH (ref 70–99)
Potassium: 4 mmol/L (ref 3.5–5.1)
Sodium: 143 mmol/L (ref 135–145)

## 2024-02-25 LAB — COOXEMETRY PANEL
Carboxyhemoglobin: 1 % (ref 0.5–1.5)
Carboxyhemoglobin: 1.8 % — ABNORMAL HIGH (ref 0.5–1.5)
Methemoglobin: <0.7 % (ref 0.0–1.5)
Methemoglobin: <0.7 % (ref 0.0–1.5)
O2 Saturation: 58.1 %
O2 Saturation: 83.8 %
Total hemoglobin: 12.1 g/dL (ref 12.0–16.0)
Total hemoglobin: 12.3 g/dL (ref 12.0–16.0)
Total oxygen content: 57.3 %
Total oxygen content: 81.8 %

## 2024-02-25 LAB — CBC
HCT: 35.8 % — ABNORMAL LOW (ref 39.0–52.0)
Hemoglobin: 11.4 g/dL — ABNORMAL LOW (ref 13.0–17.0)
MCH: 30.9 pg (ref 26.0–34.0)
MCHC: 31.8 g/dL (ref 30.0–36.0)
MCV: 97 fL (ref 80.0–100.0)
Platelets: 246 10*3/uL (ref 150–400)
RBC: 3.69 MIL/uL — ABNORMAL LOW (ref 4.22–5.81)
RDW: 15 % (ref 11.5–15.5)
WBC: 12.3 10*3/uL — ABNORMAL HIGH (ref 4.0–10.5)
nRBC: 0.6 % — ABNORMAL HIGH (ref 0.0–0.2)

## 2024-02-25 LAB — MAGNESIUM: Magnesium: 2.7 mg/dL — ABNORMAL HIGH (ref 1.7–2.4)

## 2024-02-25 LAB — TRIGLYCERIDES: Triglycerides: 53 mg/dL (ref <150)

## 2024-02-25 LAB — AMMONIA: Ammonia: 31 umol/L (ref 9–35)

## 2024-02-25 LAB — PHOSPHORUS: Phosphorus: 2.3 mg/dL — ABNORMAL LOW (ref 2.5–4.6)

## 2024-02-25 LAB — HEPARIN LEVEL (UNFRACTIONATED): Heparin Unfractionated: 0.66 [IU]/mL (ref 0.30–0.70)

## 2024-02-25 MED ORDER — AMIODARONE HCL 200 MG PO TABS
400.0000 mg | ORAL_TABLET | Freq: Two times a day (BID) | ORAL | Status: DC
  Administered 2024-02-25 – 2024-02-26 (×2): 400 mg
  Filled 2024-02-25 (×3): qty 2

## 2024-02-25 MED ORDER — DOBUTAMINE-DEXTROSE 4-5 MG/ML-% IV SOLN
2.5000 ug/kg/min | INTRAVENOUS | Status: DC
  Administered 2024-02-25: 5 ug/kg/min via INTRAVENOUS
  Filled 2024-02-25: qty 250

## 2024-02-25 MED ORDER — DEXMEDETOMIDINE HCL IN NACL 400 MCG/100ML IV SOLN
0.0000 ug/kg/h | INTRAVENOUS | Status: DC
  Administered 2024-02-25: 0.5 ug/kg/h via INTRAVENOUS
  Administered 2024-02-25: 0.4 ug/kg/h via INTRAVENOUS
  Administered 2024-02-26: 0.05 ug/kg/h via INTRAVENOUS
  Administered 2024-02-26: 0.5 ug/kg/h via INTRAVENOUS
  Administered 2024-02-27 (×2): 0.4 ug/kg/h via INTRAVENOUS
  Administered 2024-02-28: 0.5 ug/kg/h via INTRAVENOUS
  Administered 2024-02-29: 0.4 ug/kg/h via INTRAVENOUS
  Administered 2024-02-29: 0.6 ug/kg/h via INTRAVENOUS
  Administered 2024-03-01: 0.8 ug/kg/h via INTRAVENOUS
  Administered 2024-03-01 – 2024-03-02 (×3): 0.6 ug/kg/h via INTRAVENOUS
  Filled 2024-02-25 (×12): qty 100

## 2024-02-25 MED ORDER — FUROSEMIDE 10 MG/ML IJ SOLN
40.0000 mg | Freq: Once | INTRAMUSCULAR | Status: AC
  Administered 2024-02-25: 40 mg via INTRAVENOUS
  Filled 2024-02-25: qty 4

## 2024-02-25 MED ORDER — SODIUM PHOSPHATES 45 MMOLE/15ML IV SOLN
15.0000 mmol | Freq: Once | INTRAVENOUS | Status: AC
  Administered 2024-02-25: 15 mmol via INTRAVENOUS
  Filled 2024-02-25: qty 5

## 2024-02-25 NOTE — Progress Notes (Signed)
 Patient continues to take off HHFNC. Told patient that I could try the regular [place] since it was smaller, he nodded his head to agree.  Placed on 4L via [place]

## 2024-02-25 NOTE — Progress Notes (Signed)
 PHARMACY CONSULT NOTE - FOLLOW UP  Pharmacy Consult for Electrolyte Monitoring and Replacement   Recent Labs: Potassium (mmol/L)  Date Value  02/25/2024 4.0  09/15/2012 4.4   Magnesium  (mg/dL)  Date Value  92/98/7974 2.7 (H)   Calcium  (mg/dL)  Date Value  92/98/7974 8.2 (L)   Calcium , Total (mg/dL)  Date Value  98/79/7985 9.4   Albumin (g/dL)  Date Value  93/69/7974 2.6 (L)  09/15/2012 4.2   Phosphorus (mg/dL)  Date Value  92/98/7974 2.3 (L)   Sodium (mmol/L)  Date Value  02/25/2024 143  09/15/2012 146 (H)     Assessment: 73 y.o. male w/ PMH of CHF, HTN, CVA, schizophrenia admitted on 02/20/2024 with cardiac arrest. Pharmacy is asked to follow and replace electrolytes while in CCU   Free water  30 ml q4H.   Goal of Therapy:  Electrolytes WNL  Plan:  NaPhos 15mmol IV x 1 F/u with AM labs  Jaylia Pettus A Duru Reiger ,PharmD Clinical Pharmacist 02/25/2024 7:30 AM

## 2024-02-25 NOTE — Progress Notes (Signed)
  Progress Note  Patient Name: Miguel [name] Date of Encounter: 02/25/2024 [facility] Cardiologist: Joylene GLENWOOD Lunger  Interval Summary   Patient seen and examined on rounds this AM, at which time he was off sedation but remained intubated.  He would withdraw to noxious stimuli but otherwise was unresponsive and did not follow commands.  Vital Signs Vitals:   02/25/24 1334 02/25/24 1400 02/25/24 1500 02/25/24 1600  BP:  112/77 136/81 (!) 114/57  Pulse: 88 88 88 73  Resp: 19 17 17 16   Temp:  (!) 96.8 F (36 C) (!) 97.3 F (36.3 C) (!) 97.2 F (36.2 C)  TempSrc:    Bladder  SpO2: 100% 99% (!) 88% 98%  Weight:      Height:        Intake/Output Summary (Last 24 hours) at 02/25/2024 1651 Last data filed at 02/25/2024 1400 Gross per 24 hour  Intake 1819.57 ml  Output 1765 ml  Net 54.57 ml      02/25/2024    4:00 AM 02/24/2024    4:00 AM 02/23/2024    4:15 AM  Last 3 Weights  Weight (lbs) 198 lb 10.2 oz 203 lb 11.3 oz 213 lb 13.5 oz  Weight (kg) 90.1 kg 92.4 kg 97 kg      Telemetry/ECG  Atrial flutter with conversion to sinus rhythm at 4:10 AM.  Occasional PVCs noted. - Personally Reviewed  Physical Exam  GEN: Intubated, withdrawing to noxious stimuli. Neck: Unable to assess JVP due to support devices. Cardiac: RRR, no murmurs, rubs, or gallops.  Respiratory: Clear to auscultation bilaterally. GI: Soft, nontender, non-distended  MS: No edema  Assessment & Plan  Cardiac arrest: Patient with out-of-hospital cardiac arrest with postresuscitative cardiogenic shock due to severe biventricular failure.  Downtime uncertain.  He remains hypotensive on the low-dose norepinephrine .  Neurologic status seems to be gradually improving, though he was still intubated and unable to follow commands at the time of my evaluation.  If he is successfully extubated with meaningful neurologic recovery, he will need right and left heart catheterization prior to discharge.  Cardiogenic  shock and biventricular heart failure: Patient appears grossly euvolemic on exam.  CVP readings are elevated, though accuracy is questionable as readings are quite variable from hour to hour.  Given that the patient has still required norepinephrine , we are unable to initiate any goal-directed medical therapy.  Dobutamine  added by primary team this morning due to low central venous oxygen saturation of 58%.  Recommend close follow-up of central venous oxygen saturations and weaning of dobutamine  as tolerated, given its arrhythmogenic potential.  Consider low-dose milrinone as alternative.  Defer anticoagulation at this time given absence of LV thrombus on limited echo with Definity  yesterday and coagulopathy; it would be worthwhile to repeat INR tomorrow.  Acute kidney injury: The seems to be improving.  Maintain net even fluid balance.  Avoid nephrotoxic agents.   For questions or updates, please contact [facility] Please consult [email or web address] for contact info under [facility].     Signed, Lonni Hanson, MD

## 2024-02-25 NOTE — Plan of Care (Signed)
  Problem: Clinical Measurements: Goal: Ability to maintain clinical measurements within normal limits will improve Outcome: Not Progressing Goal: Will remain free from infection Outcome: Progressing Goal: Respiratory complications will improve Outcome: Progressing Goal: Cardiovascular complication will be avoided Outcome: Not Progressing   Problem: Activity: Goal: Risk for activity intolerance will decrease Outcome: Not Progressing   Problem: Nutrition: Goal: Adequate nutrition will be maintained Outcome: Progressing   Problem: Coping: Goal: Level of anxiety will decrease Outcome: Not Progressing   Problem: Elimination: Goal: Will not experience complications related to bowel motility Outcome: Progressing Goal: Will not experience complications related to urinary retention Outcome: Progressing   Problem: Pain Managment: Goal: General experience of comfort will improve and/or be controlled Outcome: Progressing   Problem: Skin Integrity: Goal: Risk for impaired skin integrity will decrease Outcome: Progressing

## 2024-02-25 NOTE — Progress Notes (Signed)
 Pt self extubated with no signs of respiratory distress at this time, pt able to protect his airway.  He is currently on 2L O2 via nasal canula O2 sats 92-94%.  He is following commands, however he is unable to answer orientation questions or state his name.  I contacted pts sister Addie Peaster via telephone to provide and update and inform her [name] self extubated, but at this time he is stable from a respiratory standpoint.  She is also aware pt is HIGH RISK FOR REINTUBATION.  Will continue to monitor and assess pt.   Miguel [name], AGNP  Pulmonary/Critical Care Pager [phone] (please enter 7 digits) PCCM Consult Pager [phone] (please enter 7 digits)

## 2024-02-25 NOTE — Progress Notes (Signed)
 PHARMACY - ANTICOAGULATION CONSULT NOTE  Pharmacy Consult for Heparin  Infusion Indication: possible LV thrombus  No Known Allergies  Patient Measurements: Height: 6' (182.9 cm) Weight: 92.4 kg (203 lb 11.3 oz) IBW/kg (Calculated) : 77.6  Vital Signs: Temp: 98.8 F (37.1 C) (07/01 0315) Temp Source: Bladder (07/01 0315) BP: 101/60 (07/01 0315) Pulse Rate: 35 (07/01 0315)  Labs: Recent Labs    02/23/24 0422 02/23/24 1518 02/24/24 0017 02/24/24 0354 02/24/24 0821 02/24/24 1725 02/25/24 0304  HGB 12.0*  --   --  11.9*  --   --  11.4*  HCT 35.7*  --   --  35.8*  --   --  35.8*  PLT 253  --   --  238  --   --  246  HEPARINUNFRC  --   --    < >  --  0.73* 0.69 0.66  CREATININE 1.21 1.11  --  1.04  --   --   --    < > = values in this interval not displayed.    Estimated Creatinine Clearance: 71.5 mL/min (by C-G formula based on SCr of 1.04 mg/dL).   Medical History: Past Medical History:  Diagnosis Date   Biventricular failure (HCC)    CVA (cerebral vascular accident) (HCC)    Polysubstance use disorder    Schizophrenia (HCC)    Assessment: Patient is 73yo male with a possible LV thrombus. Pharmacy consulted for Heparin  dosing. Patient was not initially started on Heparin  due to concern for DIC, but now improved. Patient did receive Enoxaparin  40mg  ~12:00.  Goal of Therapy:  Heparin  level 0.3-0.7 units/ml Monitor platelets by anticoagulation protocol: Yes  06/30 0017 HL 0.62, therapeutic x 1 06/30 0821 HL 0.73, SUPRAtherapeutic 06/30 1725 HL 0.69, therapeutic x1 07/01 0304 HL 0.66, therapeutic X 2    Plan:  7/01:  HL @ 0304 = 0.66, therapeutic X 2 - continue pt on current rate and recheck HL on 7/02 with AM labs Monitor heparin  levels daily while on heparin  infusion CBC daily while on heparin   Thank you for involving pharmacy in this patient's care.   Angellica Maddison D Clinical Pharmacist 02/25/2024 3:32 AM

## 2024-02-25 NOTE — Progress Notes (Signed)
 Patient self extubated. Patient placed on 2L [place], no signs of distress. Maintaining airway. MD notified.

## 2024-02-25 NOTE — Plan of Care (Signed)
  Problem: Education: Goal: Knowledge of General Education information will improve Description: Including pain rating scale, medication(s)/side effects and non-pharmacologic comfort measures Outcome: Not Progressing   Problem: Health Behavior/Discharge Planning: Goal: Ability to manage health-related needs will improve Outcome: Not Progressing   Problem: Clinical Measurements: Goal: Ability to maintain clinical measurements within normal limits will improve Outcome: Progressing Goal: Will remain free from infection Outcome: Progressing Goal: Diagnostic test results will improve Outcome: Not Progressing Goal: Respiratory complications will improve Outcome: Progressing

## 2024-02-25 NOTE — Consult Note (Signed)
 Consultation Note Date: 02/25/2024   Patient Name: Miguel [name]  DOB: 13-Sep-1950  MRN: [id number]  Age / Sex: 73 y.o., male  PCP: [facility] Referring Physician: Malka Domino, MD  Reason for Consultation: Establishing goals of care  HPI/Patient Profile: Per H&PThis is a 73 yo male who [facility] post cardiac arrest.  Per ER notes pts cellmate heard the pt gurgling this morning.  He was found pulseless with initial cardiac rhythm asystole.  EMS administered 3 doses of epi/2 mg of narcan .  Following administration of narcan  ROSC achieved.  Estimated downtime 15 minutes.  EMS reported en route to the ER pt became agitated and attempted to remove Igel.  EMS administered versed.  Pt became pulseless again en route ACLS protocol initiated.   Clinical Assessment and Goals of Care: Notes, diagnostics, and labs reviewed.  CCM has been providing updates to family.  Into see patient.  He is on ventilator support.  He opens his eyes, but does not follow commands.  He has mittens in place.  No family at bedside.  Received a message shortly after leaving the room that patient had self extubated.  CCM has updated family.    Returned to bedside.  Patient was alert and looked at me as I spoke, he re- shifted himself in bed several times but did not communicate or answer questions.  PMT will follow-up tomorrow in hopes patient will be able to participate in discussion himself.  Will look  to reach out to family following this.  SUMMARY OF RECOMMENDATIONS   PMT to follow      Primary Diagnoses: Present on Admission:  Cardiac arrest [facility])   I have reviewed the medical record, interviewed the patient and family, and examined the patient. The following aspects are pertinent.  Past Medical History:  Diagnosis Date   Biventricular failure (HCC)    CVA  (cerebral vascular accident) (HCC)    Polysubstance use disorder    Schizophrenia (HCC)    Social History   Socioeconomic History   Marital status: Divorced    Spouse name: Not on file   Number of children: Not on file   Years of education: Not on file   Highest education level: Not on file  Occupational History   Not on file  Tobacco Use   Smoking status: Never   Smokeless tobacco: Never  Vaping Use   Vaping status: Never Used  Substance and Sexual Activity   Alcohol use: No   Drug use: No   Sexual activity: Not on file  Other Topics Concern   Not on file  Social History Narrative   Not on file   Social Drivers of Health   Financial Resource Strain: Not on file  Food Insecurity: Patient Unable To Answer (02/21/2024)   Hunger Vital Sign    Worried About Running Out of Food in the Last Year: Patient unable to answer    Ran Out of Food in the Last Year:  Patient unable to answer  Transportation Needs: Patient Unable To Answer (02/21/2024)   PRAPARE - Transportation    Lack of Transportation (Medical): Patient unable to answer    Lack of Transportation (Non-Medical): Patient unable to answer  Physical Activity: Not on file  Stress: Not on file  Social Connections: Patient Unable To Answer (02/21/2024)   Social Connection and Isolation Panel    Frequency of Communication with Friends and Family: Patient unable to answer    Frequency of Social Gatherings with Friends and Family: Patient unable to answer    Attends Religious Services: Patient unable to answer    Active Member of Clubs or Organizations: Patient unable to answer    Attends Banker Meetings: Patient unable to answer    Marital Status: Patient unable to answer   Family History  Family history unknown: Yes   Scheduled Meds:  amiodarone  400 mg Per Tube BID   Chlorhexidine  Gluconate Cloth  6 each Topical Daily   docusate  100 mg Per Tube BID   feeding supplement (PROSource TF20)  60 mL Per Tube  TID   free water   30 mL Per Tube Q4H   multivitamin with minerals  1 tablet Per Tube Daily   mouth rinse  15 mL Mouth Rinse Q2H   pantoprazole  (PROTONIX ) IV  40 mg Intravenous QHS   polyethylene glycol  17 g Per Tube Daily   sodium chloride  flush  10-40 mL Intracatheter Q12H   thiamine  (VITAMIN B1) injection  100 mg Intravenous Daily   Continuous Infusions:  cefTRIAXone (ROCEPHIN)  IV 2 g (02/25/24 1204)   dexmedetomidine  (PRECEDEX ) IV infusion 0.4 mcg/kg/hr (02/25/24 1404)   DOBUTamine  5 mcg/kg/min (02/25/24 1100)   feeding supplement (VITAL 1.5 CAL) 45 mL/hr at 02/25/24 1100   fentaNYL  infusion INTRAVENOUS Stopped (02/25/24 0804)   heparin  1,400 Units/hr (02/25/24 1100)   norepinephrine  (LEVOPHED ) Adult infusion Stopped (02/25/24 0951)   sodium phosphate 15 mmol in sodium chloride  0.9 % 250 mL infusion 43 mL/hr at 02/25/24 1100   PRN Meds:.bisacodyl, docusate sodium , midazolam, mouth rinse, polyethylene glycol, sodium chloride  flush Medications Prior to Admission:  Prior to Admission medications   Medication Sig Start Date End Date Taking? Authorizing Provider  ARISTADA 882 MG/3.2ML prefilled syringe Inject 882 mg into the muscle every 30 (thirty) days. 08/23/22  Yes [provider]  atorvastatin  (LIPITOR) 40 MG tablet Take 1 tablet (40 mg total) by mouth daily at 6 PM. 07/15/18  Yes Laurence Bridegroom, MD  furosemide  (LASIX ) 40 MG tablet Take 1 tablet (40 mg total) by mouth daily. 06/19/22 02/20/24 Yes Arlander Charleston, MD  losartan (COZAAR) 25 MG tablet Take 25 mg by mouth daily. 02/10/24  Yes [provider]   No Known Allergies Review of Systems  Physical Exam  Vital Signs: BP 94/65   Pulse 88   Temp 98.4 F (36.9 C)   Resp 19   Ht 6' (1.829 m)   Wt 90.1 kg   SpO2 100%   BMI 26.94 kg/m  Pain Scale: CPOT       SpO2: SpO2: 100 % O2 Device:SpO2: 100 % O2 Flow Rate: .O2 Flow Rate (L/min): (S) 50 L/min  IO: Intake/output summary:  Intake/Output Summary  (Last 24 hours) at 02/25/2024 1526 Last data filed at 02/25/2024 1100 Gross per 24 hour  Intake 2442.49 ml  Output 1365 ml  Net 1077.49 ml    LBM: Last BM Date : 02/25/24 Baseline Weight: Weight: 103.4 kg Most recent weight: Weight:  90.1 kg       Signed by: Camelia Lewis, NP   Please contact Palliative Medicine Team phone at [phone] for questions and concerns.  For individual provider: See Tracey

## 2024-02-25 NOTE — Progress Notes (Signed)
 NAME:  Miguel [name], MRN:  [id number], DOB:  Aug 31, 1950, LOS: 5 ADMISSION DATE:  02/20/2024, CONSULTATION DATE: 02/20/2024 REFERRING MD: Dr. Levander, CHIEF COMPLAINT: Cardiac Arrest    History of Present Illness:  This is a 73 yo male who presented to [name] via EMS from jail on 06/26 post cardiac arrest.  Per ER notes pts cellmate heard the pt gurgling this morning.  He was found pulseless with initial cardiac rhythm asystole.  EMS administered 3 doses of epi/2 mg of narcan .  Following administration of narcan  ROSC achieved.  Estimated downtime 15 minutes.  EMS reported en route to the ER pt became agitated and attempted to remove Igel.  EMS administered versed.  Pt became pulseless again en route ACLS protocol initiated.  ED Course  Upon arrival to the ER pt remained pulseless in PEA.  ACLS protocol continued pt received: 2 mg of epi/4 mg of iv narcan /1 amp of bicarb/1 g of calcium .  Igel exchanged for an ETT with ongoing CPR (pt did not require RSI medications for intubation).  ROSC achieved with estimated downtime during 2nd cardiac arrest 15 minutes.  CTA Chest negative for PE.  Significant lab results were: Na+ 129/K+ 5.3/chloride 90/glucose 110/BUN 49/creatinine 2.22/calcium  7.5/mag 3.2/alk phos 134/AST 3,301/ALT 2,038/total bilirubin 5.5/troponin 344/lactic acid 8.1/urine drug screen negative/UA concerning for possible UTI.  Sepsis protocol initiated pt to receive: cefepime /metronidazole /vancomycin .  Pt found to be severely hypoglycemic CBG 17 requiring 1 amp of D50W.  Levophed  gtt initiated due to hypotension.  PCCM team contacted for ICU admission.  Pt remains in police custody with bilateral ankle cuffs in place, and police officer at bedside.   Pertinent  Medical History  Schizophrenia  Depression Current Smoker  Chronic Systolic CHF (Echo 09/11/22: EF <20%, grade II diastolic dysfunction, trivial pericardial effusion, mild mitral valve regurgitation, trivial tricuspid valve  regurgitation, trivial pulmonic valve regurgitation) CVA with aphasia  Micro Data:   MRSA PCR 06/26>>negative  COVID/Influenza A&B/RSV 06/26>>negative  RVP 06/26>>negative  Blood 06/26>>NGTD  Tracheal aspirate 06/26>>H influenza and strep pneumoniae  Urine 06/26>>less than 10,000 colonies/ml insignificant growth  Legionella pneum ur ag 06/26>>negative   Anti-infectives (From admission, onward)    Start     Dose/Rate Route Frequency Ordered Stop   02/22/24 1100  cefTRIAXone (ROCEPHIN) 2 g in sodium chloride  0.9 % 100 mL IVPB        2 g 200 mL/hr over 30 Minutes Intravenous Every 24 hours 02/22/24 0952     02/22/24 1100  azithromycin  (ZITHROMAX ) 500 mg in sodium chloride  0.9 % 250 mL IVPB  Status:  Discontinued        500 mg 250 mL/hr over 60 Minutes Intravenous Every 24 hours 02/22/24 0952 02/24/24 1009   02/21/24 0000  ceFEPIme  (MAXIPIME ) 2 g in sodium chloride  0.9 % 100 mL IVPB  Status:  Discontinued        2 g 200 mL/hr over 30 Minutes Intravenous Every 12 hours 02/20/24 1051 02/22/24 0952   02/20/24 1200  vancomycin  (VANCOREADY) IVPB 2000 mg/400 mL        2,000 mg 200 mL/hr over 120 Minutes Intravenous  Once 02/20/24 1007 02/20/24 1400   02/20/24 1050  vancomycin  variable dose per unstable renal function (pharmacist dosing)  Status:  Discontinued         Does not apply See admin instructions 02/20/24 1051 02/21/24 0925   02/20/24 0845  ceFEPIme  (MAXIPIME ) 2 g in sodium chloride  0.9 % 100 mL IVPB  2 g 200 mL/hr over 30 Minutes Intravenous  Once 02/20/24 9161 02/20/24 1410   02/20/24 0845  metroNIDAZOLE  (FLAGYL ) IVPB 500 mg        500 mg 100 mL/hr over 60 Minutes Intravenous  Once 02/20/24 0838 02/20/24 1559   02/20/24 0845  vancomycin  (VANCOCIN ) IVPB 1000 mg/200 mL premix  Status:  Discontinued        1,000 mg 200 mL/hr over 60 Minutes Intravenous  Once 02/20/24 0838 02/20/24 1007      Significant Hospital Events: Including procedures, antibiotic start and stop dates  in addition to other pertinent events   06/26: Pt admitted post cardiac arrest mechanically intubated with possible cardiogenic/possible septic shock with multiorgan failure requiring levophed  and dobutamine  gtts 06/27: on minimal sedation, discontinued this AM. Off nor-epi, continued on dobutamine  06/28: decreased dobutamine  to 2.5, remains off sedation 06/29: arouses to tactile and verbal stimuli, remains disoriented. Failed SBT this AM. Dobutamine  increased to 5 mcg overnight due to bradycardia 06/30: Pt remains mechanically intubated, however neuro exam precludes extubation.  He has not received sedation since 06/27. Dobutamine  discontinued  06/30: MRI Brain Since the previous MRI, the patient has developed chronic           encephalomalacia changes within the right medial occipital lobe compatible with an interval        PCA distribution infarct. Interval development of encephalomalacia in area of previously       demonstrated left MCA distribution infarct, which was acute on the previous MRI. 07/01: Overnight pt remained in atrial flutter with rvr hr 140 to 150's with possible ST elevation despite iv metoprolol .  Cardiologist Dr Darron notified recommended amiodarone bolus followed by amiodarone gtt for rate control.  However, this morning while on amiodarone gtt pt became severely bradycardic hr as low as 27 bpm with associated hypotension, amiodarone gtt discontinued he is now requiring low dose levophed  gtt.  Pending WUA and possible SBT today   Interim History / Subjective:  As outlined above under significant events   Objective    Blood pressure (!) 87/64, pulse 61, temperature 98.6 F (37 C), resp. rate 18, height 6' (1.829 m), weight 90.1 kg, SpO2 99%. CVP:  [0 mmHg-30 mmHg] 22 mmHg  Vent Mode: PRVC FiO2 (%):  [40 %] 40 % Set Rate:  [18 bmp] 18 bmp Vt Set:  [500 mL] 500 mL PEEP:  [8 cmH20] 8 cmH20 Pressure Support:  [8 cmH20] 8 cmH20 Plateau Pressure:  [19 cmH20-20 cmH20] 20  cmH20   Intake/Output Summary (Last 24 hours) at 02/25/2024 0731 Last data filed at 02/25/2024 0645 Gross per 24 hour  Intake 3232.8 ml  Output 1540 ml  Net 1692.8 ml   Filed Weights   02/23/24 0415 02/24/24 0400 02/25/24 0400  Weight: 97 kg 92.4 kg 90.1 kg    Examination: General: Acute on chronically-ill appearing male, NAD mechanically intubated  HENT: Very poor dentition, mild JVD  Lungs: Rhonchi throughout, even, non labored  Cardiovascular: Sinus rhythm, s1s2, no m/r/g, 1+ radial/1+ distal pulses, 1+ generalized edema  Abdomen: +BS x4, soft, non distended  Extremities: Normal bulk and tone Skin: Intact no rashes or lesions present  Neuro: Unable to follow commands, unable withdraw from painful stimulation in LUE, but able to withdraw from painful stimulation in RUE and BUE.  bilateral pupils 2 mm sluggish GU: Indwelling foley catheter draining yellow urine   Resolved problem list  Hyponatremia  Lactic acidosis  Hyperkalemia   Assessment and Plan   #Acute  metabolic encephalopathy  #Mechanical intubation pain/discomfort Hx: Polysubstance abuse, depression, and schizoaffective disorder EEG 02/21/24: Study is suggestive of severe to profound diffuse encephalopathy. No seizures or epileptiform discharges were seen throughout the recording. MRI Brain 02/24/24: Since the previous MRI, the patient has developed chronic encephalomalacia changes within the right medial occipital lobe compatible with an interval PCA distribution infarct. Interval development of encephalomalacia in area of previously demonstrated left MCA distribution infarct, which was acute on the previous MRI. - Urine drug screen negative  - Correct metabolic derangements  - Maintain RASS goal of 0  - Prn fentanyl  for pain  - WUA daily  - Avoid sedation medication as able   #Cardiac arrest (PEA)  #Cardiogenic/possible septic shock~resolved   #Acute on chronic systolic CHF #Elevated troponin suspect secondary  to demand ischemia  #Possible mural thrombus  Hx: CVA with aphasia  CTA Chest PE 02/20/24: No evidence of acute pulmonary embolism. Rounded filling defect within the right atrial appendage, suspicious for thrombus. Echo 02/20/24: EF 20 to 25% - Continuous telemetry monitoring  - Troponin peaked at 2,551  - Prn levophed  gtt to maintain map 65 or higher  - Hold outpatient diuretics, antihypertensives, and beta-blockers for now  - Repeat Echo 06/30: no definitive LV thrombus  - Diuretics held 06/30 due to CVP reading of 9 - Continue heparin  gtt: dosing per pharmacy  - Cardiology consulted appreciate input: will consider cardiac cath pending meaningful neurological recovery   #Acute respiratory failure  #Bilateral pleural effusions right greater than left  #H influenza and strep pneumoniae  #Mechanical intubation  Hx: Tobacco abuse  - Full vent support for now: vent settings reviewed and established  - Continue lung protective strategies  - Maintain plateau pressures less than 30 cm H2O - VAP bundle implemented  - SBT once all parameters met: mentation precluding extubation at this time  - Intermittent CXR's and ABG's  - Prn bronchodilator therapy   #Acute kidney injury secondary to ATN~resolved   - Trend BMP  - Replace electrolytes as indicated  - Strict I&O's - Avoid nephrotic agents as able   #Shock liver  #Elevated alk phos and total bilirubin  CT Abd/Pelvis 06/26: Mild diffuse mural thickening, which may be seen in the setting of hypoperfusion. Small volume free fluid. HCV ab 06/26: reactive  - Trend hepatic function panel - Avoid hepatotoxic agents as able   #Sepsis  #H influenza and strep pneumoniae  - Trend WBC and monitor fever curve  - Trend PCT  - Follow cultures  - Continue abx as outlined above   #Anemia without signs of bleeding  - Trend CBC  - Monitor for s/sx of bleeding  - Transfuse fo hgb <7  #Hypoglycemia  - CBG's q4hrs for now  - Follow  hypo/hyperglycemic protocol - Target CBG range 140 to 180  Best Practice (right click and Reselect all SmartList Selections daily)   Diet/type: TF's DVT prophylaxis systemic heparin  Pressure ulcer(s): N/A GI prophylaxis: PPI Lines: Central line Foley:  Yes, and it is still needed Code Status:  full code Last date of multidisciplinary goals of care discussion [02/25/24]  Attempted to update pts sister Addie Mckenzie regarding decline in pts condition, however she did not answer the telephone.  Palliative Care consulted to assist with goals of care.  Overall, pts prognosis is poor due to severe biventricular failure EF 20 to 25%.  Per Cardiology pt not a good candidate for advanced therapies such as ICD and LVAD due to polysubstance abuse hx  and medication noncompliance  Labs   CBC: Recent Labs  Lab 02/20/24 0751 02/20/24 1223 02/20/24 2256 02/21/24 0420 02/22/24 0444 02/23/24 0422 02/24/24 0354 02/25/24 0304  WBC 14.7* 18.0* 17.7* 19.3* 20.2* 23.6* 20.9* 12.3*  NEUTROABS 12.7* 16.5* 15.9*  --   --   --   --   --   HGB 11.9* 12.8* 12.1* 12.8* 12.9* 12.0* 11.9* 11.4*  HCT 37.3* 39.7 36.2* 37.1* 37.4* 35.7* 35.8* 35.8*  MCV 102.5* 97.5 95.3 93.2 91.9 93.7 95.5 97.0  PLT 355 351 311 311 286 253 238 246    Basic Metabolic Panel: Recent Labs  Lab 02/21/24 0420 02/22/24 0444 02/23/24 0422 02/23/24 1518 02/24/24 0354 02/24/24 1146 02/25/24 0304  NA 131* 133* 137 138 141  --  143  K 4.2 4.1 4.1 4.1 4.3  --  4.0  CL 94* 94* 97* 100 98  --  103  CO2 26 29 32 30 33*  --  32  GLUCOSE 86 115* 106* 111* 117*  --  117*  BUN 60* 68* 59* 53* 52*  --  52*  CREATININE 2.24* 1.77* 1.21 1.11 1.04  --  0.89  CALCIUM  7.7* 7.7* 7.6* 7.4* 7.9*  --  8.2*  MG 2.5* 2.7* 2.8* 2.4  --  2.4 2.7*  PHOS 3.5 3.4 2.6  --  2.8  --  2.3*   GFR: Estimated Creatinine Clearance: 83.6 mL/min (by C-G formula based on SCr of 0.89 mg/dL). Recent Labs  Lab 02/20/24 2028 02/20/24 2256 02/21/24 0420  02/22/24 0444 02/22/24 0802 02/23/24 0422 02/24/24 0354 02/25/24 0304  WBC  --  17.7* 19.3* 20.2*  --  23.6* 20.9* 12.3*  LATICACIDVEN 5.7* 4.0* 2.1*  --  1.3  --   --   --     Liver Function Tests: Recent Labs  Lab 02/20/24 0751 02/21/24 0420 02/22/24 0444 02/23/24 0422 02/24/24 0354  AST 3,301* 2,959*  --   --   --   ALT 2,038* 2,111*  --   --   --   ALKPHOS 134* 119  --   --   --   BILITOT 5.5* 4.9*  --   --   --   PROT 5.7* 5.2*  --   --   --   ALBUMIN 2.7* 2.6* 2.4* 2.5* 2.6*   No results for input(s): LIPASE, AMYLASE in the last 168 hours. No results for input(s): AMMONIA in the last 168 hours.  ABG    Component Value Date/Time   PHART 7.35 02/20/2024 1452   PCO2ART 45 02/20/2024 1452   PO2ART 59 (L) 02/20/2024 1452   HCO3 24.8 02/20/2024 1452   ACIDBASEDEF 1.1 02/20/2024 1452   O2SAT 68.7 02/24/2024 1548     Coagulation Profile: Recent Labs  Lab 02/20/24 1344 02/20/24 2256 02/21/24 0420  INR 3.4* 2.5* 2.8*    Cardiac Enzymes: No results for input(s): CKTOTAL, CKMB, CKMBINDEX, TROPONINI in the last 168 hours.  HbA1C: Hgb A1c MFr Bld  Date/Time Value Ref Range Status  07/14/2018 04:29 AM 5.8 (H) 4.8 - 5.6 % Final    Comment:    (NOTE) Pre diabetes:          5.7%-6.4% Diabetes:              >6.4% Glycemic control for   <7.0% adults with diabetes   07/13/2018 04:25 AM 6.2 (H) 4.8 - 5.6 % Final    Comment:    (NOTE) Pre diabetes:  5.7%-6.4% Diabetes:              >6.4% Glycemic control for   <7.0% adults with diabetes     CBG: Recent Labs  Lab 02/24/24 1141 02/24/24 1627 02/24/24 2325 02/25/24 0309 02/25/24 0726  GLUCAP 102* 86 122* 122* 143*    Review of Systems:   Unable to assess pt mechanically intubated   Past Medical History:  He,  has a past medical history of Biventricular failure (HCC), CVA (cerebral vascular accident) (HCC), Polysubstance use disorder, and Schizophrenia (HCC).   Surgical  History:   Past Surgical History:  Procedure Laterality Date   APPENDECTOMY       Social History:   reports that he has never smoked. He has never used smokeless tobacco. He reports that he does not drink alcohol and does not use drugs.   Family History:  His Family history is unknown by patient.   Allergies No Known Allergies   Home Medications  Prior to Admission medications   Medication Sig Start Date End Date Taking? Authorizing Provider  ARIPiprazole  (ABILIFY ) 5 MG tablet Take 1 tablet (5 mg total) by mouth daily. 09/17/22   Jens Durand, MD  ARISTADA 882 MG/3.2ML prefilled syringe Inject 882 mg into the muscle every 30 (thirty) days. 08/23/22   [provider]  atorvastatin  (LIPITOR) 40 MG tablet Take 1 tablet (40 mg total) by mouth daily at 6 PM. 07/15/18   Laurence Bridegroom, MD  divalproex  (DEPAKOTE ) 500 MG DR tablet Take 1 tablet (500 mg total) by mouth every 12 (twelve) hours. 09/17/22   Jens Durand, MD  furosemide  (LASIX ) 40 MG tablet Take 1 tablet (40 mg total) by mouth daily. 06/19/22 06/19/23  Arlander Charleston, MD  lisinopril  (ZESTRIL ) 10 MG tablet Take 1 tablet (10 mg total) by mouth daily. 09/17/22   Jens Durand, MD  metoprolol  succinate (TOPROL -XL) 25 MG 24 hr tablet Take 1 tablet (25 mg total) by mouth daily. 09/17/22   Jens Durand, MD  ondansetron  (ZOFRAN -ODT) 4 MG disintegrating tablet Take 1 tablet (4 mg total) by mouth every 6 (six) hours as needed for nausea or vomiting. 04/27/23   Ward, Josette SAILOR, DO  pantoprazole  (PROTONIX ) 40 MG tablet Take 1 tablet (40 mg total) by mouth daily. 04/27/23 04/26/24  Ward, Josette SAILOR, DO     Critical care time: 45 minutes      Lonell Moose, AGNP  Pulmonary/Critical Care Pager [phone] (please enter 7 digits) PCCM Consult Pager [phone] (please enter 7 digits)

## 2024-02-25 NOTE — Progress Notes (Signed)
  Updated pts sister Addie Gebbia via telephone regarding pts overall poor prognosis and discussed decision making regarding pts goals of care moving forward.  Pt has multiple siblings and Ms. Printy does not think the pt has completed Healthcare Power of Gabriella Six stating who he would want to make healthcare decisions in the event he is unable to make decisions for himself.  I informed [facility] Team has been consulted to assist with goals of treatment.   Lonell Moose, AGNP  Pulmonary/Critical Care Pager [phone] (please enter 7 digits) PCCM Consult Pager [phone] (please enter 7 digits)

## 2024-02-25 NOTE — Progress Notes (Addendum)
 Notified by RN that the patient was agitated and restless, trying to pull at his endotracheal tube (ETT) despite having bilateral mitts on. The patient was also experiencing tachycardia, with a heart rate consistently in the 140s to 150s. He had previously been treated with dobutamine , which has since been discontinued, and he is currently receiving levophed  for hypotension. Fentanyl  100 mcg was administered but provided no improvement. Versed 2 mg IV was ordered and given, resulting in an immediate decrease in agitation; however, the heart rate remained above 150 and he began to exhibit multifocal premature ventricular contractions (PVCs). An EKG indicated atrial flutter with a prolonged QTc interval over 500. IV metoprolol  5 mg, 2 g of magnesium , and calcium  gluconate were administered, which resolved the PVCs; nevertheless, the patient remained in atrial flutter. A second EKG revealed atrial flutter with 2:1 AV conduction and possible ST-elevation myocardial infarction (STEMI). Upon review of the EKG, no ST elevations or other signs of STEMI noted. I discussed the findings with Dr. Darron, the STEMI on-call physician, who reviewed the EKG and confirmed that there were no dynamic changes to meet STEMI criteria. He recommended starting an amiodarone drip for rate control. Amiodarone gtt started at 23:45 per cardiology's recommendation, with improvement in heart rate.  This morning, I was called to the bedside as the patient had developed bradycardia, with a heart rate dropping as low as 27 beats per minute, with associated hypotension. The amiodarone drip was stopped, with improvement in heart rate to 40s to 50s, which is currently sustained. Amiodarone remains on hold, and the patient continues on a low dose of levophed  to maintain a mean arterial pressure (MAP) above 65 mm Hg.   [name], DNP, CCRN, FNP-C, AGACNP-BC Acute Care & Family Nurse Practitioner  [facility]  See Amion  for personal pager PCCM on call pager [phone] until 7 am

## 2024-02-26 ENCOUNTER — Inpatient Hospital Stay

## 2024-02-26 DIAGNOSIS — I469 Cardiac arrest, cause unspecified: Secondary | ICD-10-CM | POA: Diagnosis not present

## 2024-02-26 DIAGNOSIS — Z7189 Other specified counseling: Secondary | ICD-10-CM | POA: Diagnosis not present

## 2024-02-26 DIAGNOSIS — I5023 Acute on chronic systolic (congestive) heart failure: Secondary | ICD-10-CM | POA: Diagnosis not present

## 2024-02-26 DIAGNOSIS — G9341 Metabolic encephalopathy: Secondary | ICD-10-CM | POA: Diagnosis not present

## 2024-02-26 DIAGNOSIS — K6389 Other specified diseases of intestine: Secondary | ICD-10-CM | POA: Diagnosis not present

## 2024-02-26 DIAGNOSIS — Z4682 Encounter for fitting and adjustment of non-vascular catheter: Secondary | ICD-10-CM | POA: Diagnosis not present

## 2024-02-26 DIAGNOSIS — J90 Pleural effusion, not elsewhere classified: Secondary | ICD-10-CM | POA: Diagnosis not present

## 2024-02-26 DIAGNOSIS — B963 Hemophilus influenzae [H. influenzae] as the cause of diseases classified elsewhere: Secondary | ICD-10-CM | POA: Diagnosis not present

## 2024-02-26 DIAGNOSIS — R140 Abdominal distension (gaseous): Secondary | ICD-10-CM | POA: Diagnosis not present

## 2024-02-26 LAB — BASIC METABOLIC PANEL WITH GFR
Anion gap: 10 (ref 5–15)
BUN: 43 mg/dL — ABNORMAL HIGH (ref 8–23)
CO2: 31 mmol/L (ref 22–32)
Calcium: 8.2 mg/dL — ABNORMAL LOW (ref 8.9–10.3)
Chloride: 100 mmol/L (ref 98–111)
Creatinine, Ser: 0.97 mg/dL (ref 0.61–1.24)
GFR, Estimated: >60 mL/min (ref >60)
Glucose, Bld: 85 mg/dL (ref 70–99)
Potassium: 4.6 mmol/L (ref 3.5–5.1)
Sodium: 141 mmol/L (ref 135–145)

## 2024-02-26 LAB — HEPATIC FUNCTION PANEL
ALT: 454 U/L — ABNORMAL HIGH (ref 0–44)
AST: 116 U/L — ABNORMAL HIGH (ref 15–41)
Albumin: 2.5 g/dL — ABNORMAL LOW (ref 3.5–5.0)
Alkaline Phosphatase: 78 U/L (ref 38–126)
Bilirubin, Direct: 0.6 mg/dL — ABNORMAL HIGH (ref 0.0–0.2)
Indirect Bilirubin: 0.9 mg/dL (ref 0.3–0.9)
Total Bilirubin: 1.5 mg/dL — ABNORMAL HIGH (ref 0.0–1.2)
Total Protein: 5.9 g/dL — ABNORMAL LOW (ref 6.5–8.1)

## 2024-02-26 LAB — HEPARIN LEVEL (UNFRACTIONATED)
Heparin Unfractionated: 0.84 [IU]/mL — ABNORMAL HIGH (ref 0.30–0.70)
Heparin Unfractionated: 0.84 [IU]/mL — ABNORMAL HIGH (ref 0.30–0.70)
Heparin Unfractionated: 0.45 [IU]/mL (ref 0.30–0.70)

## 2024-02-26 LAB — CBC
HCT: 36.4 % — ABNORMAL LOW (ref 39.0–52.0)
Hemoglobin: 11.7 g/dL — ABNORMAL LOW (ref 13.0–17.0)
MCH: 31.6 pg (ref 26.0–34.0)
MCHC: 32.1 g/dL (ref 30.0–36.0)
MCV: 98.4 fL (ref 80.0–100.0)
Platelets: 250 10*3/uL (ref 150–400)
RBC: 3.7 MIL/uL — ABNORMAL LOW (ref 4.22–5.81)
RDW: 15.2 % (ref 11.5–15.5)
WBC: 11.9 10*3/uL — ABNORMAL HIGH (ref 4.0–10.5)
nRBC: 0.2 % (ref 0.0–0.2)

## 2024-02-26 LAB — PHOSPHORUS: Phosphorus: 4.3 mg/dL (ref 2.5–4.6)

## 2024-02-26 LAB — MAGNESIUM: Magnesium: 2.2 mg/dL (ref 1.7–2.4)

## 2024-02-26 LAB — COOXEMETRY PANEL
Carboxyhemoglobin: 1.9 % — ABNORMAL HIGH (ref 0.5–1.5)
Methemoglobin: <0.7 % (ref 0.0–1.5)
O2 Saturation: 83.3 %
Total hemoglobin: 12.6 g/dL (ref 12.0–16.0)
Total oxygen content: 81.7 %

## 2024-02-26 LAB — PROTIME-INR
INR: 1.2 (ref 0.8–1.2)
Prothrombin Time: 15.9 s — ABNORMAL HIGH (ref 11.4–15.2)

## 2024-02-26 LAB — GLUCOSE, CAPILLARY
Glucose-Capillary: 84 mg/dL (ref 70–99)
Glucose-Capillary: 75 mg/dL (ref 70–99)
Glucose-Capillary: 74 mg/dL (ref 70–99)
Glucose-Capillary: 83 mg/dL (ref 70–99)
Glucose-Capillary: 62 mg/dL — ABNORMAL LOW (ref 70–99)
Glucose-Capillary: 122 mg/dL — ABNORMAL HIGH (ref 70–99)

## 2024-02-26 MED ORDER — PROSOURCE TF20 ENFIT COMPATIBL EN LIQD
60.0000 mL | Freq: Every day | ENTERAL | Status: DC
  Administered 2024-02-27 – 2024-03-02 (×4): 60 mL
  Filled 2024-02-26 (×5): qty 60

## 2024-02-26 MED ORDER — FREE WATER
30.0000 mL | Status: DC
  Administered 2024-02-26 – 2024-02-29 (×5): 30 mL

## 2024-02-26 MED ORDER — OSMOLITE 1.5 CAL PO LIQD
1000.0000 mL | ORAL | Status: DC
  Administered 2024-02-26: 1000 mL

## 2024-02-26 MED ORDER — DEXTROSE 50 % IV SOLN
25.0000 g | INTRAVENOUS | Status: AC
  Administered 2024-02-26: 25 g via INTRAVENOUS

## 2024-02-26 MED FILL — Medication: Qty: 1 | Status: AC

## 2024-02-26 NOTE — Evaluation (Addendum)
 Clinical/Bedside Swallow Evaluation Patient Details  Name: Miguel [name] MRN: [id number] Date of Birth: Jul 23, 1951  Today's Date: 02/26/2024 Time: SLP Start Time (ACUTE ONLY): 0850 SLP Stop Time (ACUTE ONLY): 0945 SLP Time Calculation (min) (ACUTE ONLY): 55 min  Past Medical History:  Past Medical History:  Diagnosis Date   Biventricular failure (HCC)    CVA (cerebral vascular accident) (HCC)    Polysubstance use disorder    Schizophrenia (HCC)    Past Surgical History:  Past Surgical History:  Procedure Laterality Date   APPENDECTOMY     HPI:  Pt is a 73 y.o. male with PMHx of biventricular CHF and polysubstance abuse admitted following out-of-hospital Cardiac Arrest (15 minutes downtime) with Cardiogenic shock due to severe Biventricular CHF, sepsis due to H. Influenzae and Strep Pneumoniae Pneumonia, AKI, shock liver, and Acute Metabolic Encephalopathy.  Pt presented to [facility] ER via EMS from jail on 06/26 post cardiac arrest.  Per ER notes pts cellmate heard the pt gurgling this morning.  He was found pulseless with initial cardiac rhythm asystole.  EMS administered 3 doses of epi/2 mg of narcan .  Following administration of narcan  ROSC achieved.  Estimated downtime 15 minutes.  EMS reported en route to the ER pt became agitated and attempted to remove Igel.  EMS administered versed.  Pt became pulseless again en route ACLS protocol initiated.     ED Course   Upon arrival to the ER pt remained pulseless in PEA.  ACLS protocol continued Igel exchanged for an ETT with ongoing CPR. PCCM team contacted for ICU admission.  Pt remains in police custody with bilateral ankle cuffs in place, and police officer at bedside.    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     Assessment / Plan / Recommendation  Clinical Impression   Pt seen today for BSE. Pt resting in bed; eyes  closed w/ intermittent eye opening to verbal/tactile stim. Pt verbally responded w/ few Phonations only- no words. He did not follow any commands. Intermittent confusion w/ hands up in the air in agitation to full assist to move up/sit up in the bed and during the po boluses presented. Bilat Mitts in place, Precedex +. NSG reported pt has been agitated.  On 4L [place] O2 support; low temp. WBC 11.9.  Pt appears to present w/ concern for oropharyngeal phase dysphagia in setting of declined mental status/Cognitive functioning. He is easily agitated w/ movement in bed and w/ oral care and po boluses presented to lips. Pt often put mitted hands up and pushed this SLP's hand away; also pursed lips and spit when spoon was placed at lips. TSP trials of puree and Nectar liquids attempted but not accepted by pt to make an assessment of swallowing this date.   Pt presents w/ concern for aspiration/aspiration pneumonia in current presentation/State. Recommend strict NPO status w/ frequent oral care(when he allows calmly) for hygiene and stimulation of swallowing; aspiration precautions. ST services will continue to f/u w/ ongoing assessment of swallowing, po trials when pt's State is improved and pt is alert for safe po trials. Dietician is following determining need for NGT to support nutrition needs at this time.  MD/NSG/Team updated during rounds.  SLP Visit Diagnosis: Dysphagia, oropharyngeal phase (R13.12) (poor participation and alertness)    Aspiration Risk  Severe aspiration risk;Risk for inadequate nutrition/hydration    Diet Recommendation   NPO; oral care  Medication Administration: Via  alternative means    Other  Recommendations Recommended Consults:  (Dietician following) Oral Care Recommendations: Oral care QID;Staff/trained caregiver to provide oral care (for stim, hygiene) Caregiver Recommendations:  (tbd)     Assistance Recommended at Discharge    Functional Status Assessment Patient has had a  recent decline in their functional status and demonstrates the ability to make significant improvements in function in a reasonable and predictable amount of time.  Frequency and Duration min 2x/week  2 weeks       Prognosis Prognosis for improved oropharyngeal function: Fair Barriers to Reach Goals: Cognitive deficits;Language deficits;Time post onset;Severity of deficits;Behavior Barriers/Prognosis Comment: poor alertness; agitation      Swallow Study   General Date of Onset: 03/21/24 HPI: Pt is a 73 y.o. male with PMHx of biventricular CHF and polysubstance abuse admitted following out-of-hospital Cardiac Arrest (15 minutes downtime) with Cardiogenic shock due to severe Biventricular CHF, sepsis due to H. Influenzae and Strep Pneumoniae Pneumonia, AKI, shock liver, and Acute Metabolic Encephalopathy.  Pt presented to [facility] ER via EMS from jail on 06/26 post cardiac arrest.  Per ER notes pts cellmate heard the pt gurgling this morning.  He was found pulseless with initial cardiac rhythm asystole.  EMS administered 3 doses of epi/2 mg of narcan .  Following administration of narcan  ROSC achieved.  Estimated downtime 15 minutes.  EMS reported en route to the ER pt became agitated and attempted to remove Igel.  EMS administered versed.  Pt became pulseless again en route ACLS protocol initiated.     ED Course   Upon arrival to the ER pt remained pulseless in PEA.  ACLS protocol continued Igel exchanged for an ETT with ongoing CPR. PCCM team contacted for ICU admission.  Pt remains in police custody with bilateral ankle cuffs in place, and police officer at bedside.   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 Type of Study: Bedside Swallow Evaluation Previous Swallow Assessment: none Diet Prior to this Study: NPO Temperature Spikes Noted: No (wbc 11.9) Respiratory Status: Nasal cannula (4L) History of Recent  Intubation: Yes Total duration of intubation (days): 5 days Date extubated: 02/25/24 Behavior/Cognition: Agitated;Uncooperative;Lethargic/Drowsy;Distractible;Requires cueing;Doesn't follow directions Oral Cavity Assessment: Dry (min) Oral Care Completed by SLP: Yes (attempted) Oral Cavity - Dentition: Missing dentition (some front lower teeth; missing some upper dentition) Vision:  (n/a) Self-Feeding Abilities: Total assist Patient Positioning: Upright in bed (full assist) Baseline Vocal Quality:  (nonverbal) Volitional Cough: Cognitively unable to elicit Volitional Swallow: Unable to elicit    Oral/Motor/Sensory Function Overall Oral Motor/Sensory Function:  (unable to assess at this time)   Ice Chips Ice chips: Not tested   Thin Liquid Thin Liquid: Not tested    Nectar Thick Nectar Thick Liquid: Not tested   Honey Thick Honey Thick Liquid: Not tested   Puree Puree: Not tested   Solid     Solid: Not tested        Comer Portugal, MS, CCC-SLP Speech Language Pathologist Rehab Services; [facility] [place] [phone] (ascom) Morty Ortwein 02/26/2024,4:45 PM

## 2024-02-26 NOTE — Progress Notes (Signed)
 PHARMACY - ANTICOAGULATION CONSULT NOTE  Pharmacy Consult for Heparin  Infusion Indication: possible LV thrombus  No Known Allergies  Patient Measurements: Height: 6' (182.9 cm) Weight: 91.9 kg (202 lb 9.6 oz) IBW/kg (Calculated) : 77.6  Vital Signs: Temp: 97.6 F (36.4 C) (07/02 2000) Temp Source: Oral (07/02 2000) BP: 119/58 (07/02 2200) Pulse Rate: 92 (07/02 2200)  Labs: Recent Labs    02/24/24 0354 02/24/24 0821 02/25/24 0304 02/26/24 0401 02/26/24 1333 02/26/24 2144  HGB 11.9*  --  11.4* 11.7*  --   --   HCT 35.8*  --  35.8* 36.4*  --   --   PLT 238  --  246 250  --   --   LABPROT  --   --   --  15.9*  --   --   INR  --   --   --  1.2  --   --   HEPARINUNFRC  --    < > 0.66 0.84* 0.84* 0.45  CREATININE 1.04  --  0.89 0.97  --   --    < > = values in this interval not displayed.    Estimated Creatinine Clearance: 76.7 mL/min (by C-G formula based on SCr of 0.97 mg/dL).   Medical History: Past Medical History:  Diagnosis Date   Biventricular failure (HCC)    CVA (cerebral vascular accident) (HCC)    Polysubstance use disorder    Schizophrenia (HCC)    Assessment: Patient is 73yo male with a possible LV thrombus. Pharmacy consulted for Heparin  dosing. Patient was not initially started on Heparin  due to concern for DIC, but now improved. Patient did receive Enoxaparin  40mg  ~12:00.  Goal of Therapy:  Heparin  level 0.3-0.7 units/ml Monitor platelets by anticoagulation protocol: Yes  06/30 0017 HL 0.62, therapeutic x 1 06/30 0821 HL 0.73, SUPRAtherapeutic 06/30 1725 HL 0.69, therapeutic x1 07/01 0304 HL 0.66, therapeutic X 2  07/02 0401 HL 0.84, elevated  07/02 2144 HL 0.45, therapeutic X 1    Plan:  07/02:  HL @ 2144 = 0.45, therapeutic X 1 - Will continue pt on current rate and recheck HL in 8 hrs. Monitor heparin  levels daily while on heparin  infusion CBC daily while on heparin   Thank you for involving pharmacy in this patient's care.    Daphne Karrer D Clinical Pharmacist 02/26/2024 10:27 PM

## 2024-02-26 NOTE — Progress Notes (Addendum)
 Nutrition Follow-up  DOCUMENTATION CODES:   Non-severe (moderate) malnutrition in context of social or environmental circumstances  INTERVENTION:   Osmolite 1.5@60ml /hr- Initiate at 10ml/hr and increase by 10ml/hr q 8 hours until goal rate is reached.   ProSource TF 20- Give 60ml BID via tube, each supplement provides 80kcal and 20g of protein.   Free water  flushes 30ml q4 hours to maintain tube patency   Regimen provides 2320kcal/day, 130g/day protein and 1231ml/day of fluid.   MVI daily via tube  Thiamine  100mg  daily via tube  Daily weights   NUTRITION DIAGNOSIS:   Moderate Malnutrition related to social / environmental circumstances as evidenced by mild fat depletion, mild muscle depletion, edema. -ongoing   GOAL:   Patient will meet greater than or equal to 90% of their needs -not met   MONITOR:   Diet advancement, Labs, Weight trends, I & O's, Skin  ASSESSMENT:   73 y/o male with h/o CHF, dilated cardiomyopathy, substance abuse, schizophrenia, CVA, depression, HCV, homelessness and recently in jail who is admitted with PEA arrest, AKI, CHF with bilateral pleural effusions, thrombus, shock, H influenza and strep pneumoniae and sepsis.  Pt self extubated yesterday. SLP working with patient at time of RD visit today; pt non-compliant, refusing to open his mouth and swatting at SLP. Pt with remain NPO at this time. NGT placed today. Plan is for nutrition support. Pt remains at refeed risk. Per chart, pt is down ~14lbs since admission but remains up ~17lbs from his UBW. Palliative care following.   Medications reviewed and include: MVI, protonix ,  thiamine , ceftriaxone, heparin   Labs reviewed: K 4.6 wnl, BUN 43(H), P 4.3 wnl Mg 2.2 wnl Folate > 40 wnl, B12 7412(H)- 6/26 Wbc- 11.9(H) Cbgs- 75, 84 x 24 hrs   UOP-   Diet Order:   Diet Order             Diet NPO time specified  Diet effective now                  EDUCATION NEEDS:   Not appropriate  for education at this time  Skin:  Skin Assessment: Reviewed RN Assessment  Last BM:  7/1- type 6  Height:   Ht Readings from Last 1 Encounters:  02/21/24 6' (1.829 m)    Weight:   Wt Readings from Last 1 Encounters:  02/26/24 91.9 kg    Ideal Body Weight:  80.9 kg  BMI:  Body mass index is 27.48 kg/m.  Estimated Nutritional Needs:   Kcal:  2300-2600kcal/day  Protein:  115-130g/day  Fluid:  2.0L/day  Augustin Shams MS, RD, LDN If unable to be reached, please send secure chat to RD inpatient available from 8:00a-4:00p daily

## 2024-02-26 NOTE — Progress Notes (Signed)
 PHARMACY CONSULT NOTE - FOLLOW UP  Pharmacy Consult for Electrolyte Monitoring and Replacement   Recent Labs: Potassium (mmol/L)  Date Value  02/26/2024 4.6  09/15/2012 4.4   Magnesium  (mg/dL)  Date Value  92/97/7974 2.2   Calcium  (mg/dL)  Date Value  92/97/7974 8.2 (L)   Calcium , Total (mg/dL)  Date Value  98/79/7985 9.4   Albumin (g/dL)  Date Value  92/97/7974 2.5 (L)  09/15/2012 4.2   Phosphorus (mg/dL)  Date Value  92/97/7974 4.3   Sodium (mmol/L)  Date Value  02/26/2024 141  09/15/2012 146 (H)     Assessment: 73 y.o. male w/ PMH of CHF, HTN, CVA, schizophrenia admitted on 02/20/2024 with cardiac arrest. Pharmacy is asked to follow and replace electrolytes while in CCU   Free water  30 ml q4H.   Goal of Therapy:  Electrolytes WNL  Plan:  No replacement indicated F/u with AM labs  Idolina DELENA Percy ,PharmD Clinical Pharmacist 02/26/2024 7:28 AM

## 2024-02-26 NOTE — Progress Notes (Signed)
 Kangaroo pump continues to alarm clogged line. Attempted to flush and aspirate with syringe, nothing would give with forwards nor backwards pull. Turned feeds off and notified APP. Will obtain a KUB in the AM given difficulties with placement on day shift.

## 2024-02-26 NOTE — Progress Notes (Signed)
 Updated pt's sister Somtochukwu Woollard via telephone on pt's status and plan of care.  All questions answered, she is very appreciative of update.     Inge Lecher, AGACNP-BC [facility] Prefer epic messenger for cross cover needs If after hours, please call E-link

## 2024-02-26 NOTE — Progress Notes (Signed)
 OT Cancellation Note  Patient Details Name: Miguel [name] MRN: [id number] DOB: December 04, 1950   Cancelled Treatment:    Reason Eval/Treat Not Completed: Patient's level of consciousness. Chart reviewed, on arrival RN completing NGT placement, reports pt fatigued and AM agitation with activity. Request to hold and re-attempt next date as able.   Elston Slot, M.S. OTR/L  02/26/24, 2:40 PM  ascom [phone]

## 2024-02-26 NOTE — Progress Notes (Signed)
 PHARMACY - ANTICOAGULATION CONSULT NOTE  Pharmacy Consult for Heparin  Infusion Indication: possible LV thrombus  No Known Allergies  Patient Measurements: Height: 6' (182.9 cm) Weight: 90.1 kg (198 lb 10.2 oz) IBW/kg (Calculated) : 77.6  Vital Signs: Temp: 96.6 F (35.9 C) (07/02 0400) Temp Source: Bladder (07/02 0400) BP: 137/80 (07/02 0400) Pulse Rate: 77 (07/02 0400)  Labs: Recent Labs    02/24/24 0354 02/24/24 0821 02/24/24 1725 02/25/24 0304 02/26/24 0401  HGB 11.9*  --   --  11.4* 11.7*  HCT 35.8*  --   --  35.8* 36.4*  PLT 238  --   --  246 250  LABPROT  --   --   --   --  15.9*  INR  --   --   --   --  1.2  HEPARINUNFRC  --    < > 0.69 0.66 0.84*  CREATININE 1.04  --   --  0.89 0.97   < > = values in this interval not displayed.    Estimated Creatinine Clearance: 76.7 mL/min (by C-G formula based on SCr of 0.97 mg/dL).   Medical History: Past Medical History:  Diagnosis Date   Biventricular failure (HCC)    CVA (cerebral vascular accident) (HCC)    Polysubstance use disorder    Schizophrenia (HCC)    Assessment: Patient is 73yo male with a possible LV thrombus. Pharmacy consulted for Heparin  dosing. Patient was not initially started on Heparin  due to concern for DIC, but now improved. Patient did receive Enoxaparin  40mg  ~12:00.  Goal of Therapy:  Heparin  level 0.3-0.7 units/ml Monitor platelets by anticoagulation protocol: Yes  06/30 0017 HL 0.62, therapeutic x 1 06/30 0821 HL 0.73, SUPRAtherapeutic 06/30 1725 HL 0.69, therapeutic x1 07/01 0304 HL 0.66, therapeutic X 2  07/02 0401 HL 0.84, elevated    Plan:  7/02:  HL @ 0401 = 0.84, elevated - Will decrease heparin  drip rate to 1250 units/hr and recheck HL 8 hrs after rate change Monitor heparin  levels daily while on heparin  infusion CBC daily while on heparin   Thank you for involving pharmacy in this patient's care.   Ashe Graybeal D Clinical Pharmacist 02/26/2024 4:41 AM

## 2024-02-26 NOTE — Progress Notes (Signed)
 PT Cancellation Note  Patient Details Name: Miguel [name] MRN: [id number] DOB: October 24, 1950   Cancelled Treatment:    Reason Eval/Treat Not Completed: Patient's level of consciousness RN asks to hold today, patient is unable to to participate. Will re-attempt tomorrow.   Eddith Mentor 02/26/2024, 1:32 PM

## 2024-02-26 NOTE — Progress Notes (Signed)
 PHARMACY - ANTICOAGULATION CONSULT NOTE  Pharmacy Consult for Heparin  Infusion Indication: possible LV thrombus  No Known Allergies  Patient Measurements: Height: 6' (182.9 cm) Weight: 91.9 kg (202 lb 9.6 oz) IBW/kg (Calculated) : 77.6  Vital Signs: Temp: 97 F (36.1 C) (07/02 1300) Temp Source: Bladder (07/02 0900) BP: 87/57 (07/02 1300) Pulse Rate: 58 (07/02 1300)  Labs: Recent Labs    02/24/24 0354 02/24/24 0821 02/25/24 0304 02/26/24 0401 02/26/24 1333  HGB 11.9*  --  11.4* 11.7*  --   HCT 35.8*  --  35.8* 36.4*  --   PLT 238  --  246 250  --   LABPROT  --   --   --  15.9*  --   INR  --   --   --  1.2  --   HEPARINUNFRC  --    < > 0.66 0.84* 0.84*  CREATININE 1.04  --  0.89 0.97  --    < > = values in this interval not displayed.    Estimated Creatinine Clearance: 76.7 mL/min (by C-G formula based on SCr of 0.97 mg/dL).   Medical History: Past Medical History:  Diagnosis Date   Biventricular failure (HCC)    CVA (cerebral vascular accident) (HCC)    Polysubstance use disorder    Schizophrenia (HCC)    Assessment: Patient is 73yo male with a possible LV thrombus. Pharmacy consulted for Heparin  dosing. Patient was not initially started on Heparin  due to concern for DIC, but now improved. Patient did receive Enoxaparin  40mg  ~12:00.  Goal of Therapy:  Heparin  level 0.3-0.7 units/ml Monitor platelets by anticoagulation protocol: Yes  06/30 0017 HL 0.62, therapeutic x 1 06/30 0821 HL 0.73, SUPRAtherapeutic 06/30 1725 HL 0.69, therapeutic x1 07/01 0304 HL 0.66, therapeutic X 2  07/02 0401 HL 0.84, elevated    Plan:  7/02:  HL @ 1333 = 0.84, elevated - Will decrease heparin  drip rate to 1100 units/hr and recheck HL 8 hrs after rate change Monitor heparin  levels daily while on heparin  infusion CBC daily while on heparin   Thank you for involving pharmacy in this patient's care.   Dashton Czerwinski A Bruchy Mikel Clinical Pharmacist 02/26/2024 2:03 PM

## 2024-02-26 NOTE — Progress Notes (Signed)
 Okay to use dobhoff per Dr. Malka.

## 2024-02-26 NOTE — Progress Notes (Signed)
 NAME:  Miguel [name], MRN:  [id number], DOB:  [birth year], LOS: 6 ADMISSION DATE:  02/20/2024, CONSULTATION DATE:  02/20/2024 REFERRING MD:  Dr. Levander, CHIEF COMPLAINT:  Cardiac Arrest   Brief Pt Description / Synopsis:  73 y.o. male with PMHx of biventricular CHF and polysubstance abuse admitted following out-of-hospital Cardiac Arrest (15 minutes downtime) with Cardiogenic shock due to severe Biventricular CHF, sepsis due to H. Influenzae and Strep Pneumoniae Pneumonia, AKI, shock liver, and Acute Metabolic Encephalopathy.  History of Present Illness:  This is a 73 yo male who [facility] ER [facility] from jail on 06/26 post cardiac arrest.  Per ER notes pts cellmate heard the pt gurgling this morning.  He was found pulseless with initial cardiac rhythm asystole.  EMS administered 3 doses of epi/2 mg of narcan .  Following administration of narcan  ROSC achieved.  Estimated downtime 15 minutes.  EMS reported en route to the ER pt became agitated and attempted to remove Igel.  EMS administered versed.  Pt became pulseless again en route ACLS protocol initiated.   ED Course  Upon arrival to the ER pt remained pulseless in PEA.  ACLS protocol continued pt received: 2 mg of epi/4 mg of iv narcan /1 amp of bicarb/1 g of calcium .  Igel exchanged for an ETT with ongoing CPR (pt did not require RSI medications for intubation).  ROSC achieved with estimated downtime during 2nd cardiac arrest 15 minutes.  CTA Chest negative for PE.  Significant lab results were: Na+ 129/K+ 5.3/chloride 90/glucose 110/BUN 49/creatinine 2.22/calcium  7.5/mag 3.2/alk phos 134/AST 3,301/ALT 2,038/total bilirubin 5.5/troponin 344/lactic acid 8.1/urine drug screen negative/UA concerning for possible UTI.  Sepsis protocol initiated pt to receive: cefepime /metronidazole /vancomycin .  Pt found to be severely hypoglycemic CBG 17 requiring 1 amp of D50W.  Levophed  gtt initiated due to hypotension.  PCCM team contacted for ICU admission.  Pt  remains in police custody with bilateral ankle cuffs in place, and police officer at bedside.   PCCM asked to admit for further workup and treatment.  Please see Significant Hospital Events section below for full detailed hospital course.   Pertinent  Medical History  Schizophrenia  Depression Current Smoker  Chronic Systolic CHF (Echo 09/11/22: EF <20%, grade II diastolic dysfunction, trivial pericardial effusion, mild mitral valve regurgitation, trivial tricuspid valve regurgitation, trivial pulmonic valve regurgitation) CVA with aphasia  Micro Data:  MRSA PCR 06/26>>negative  COVID/Influenza A&B/RSV 06/26>>negative  RVP 06/26>>negative  Blood 06/26>>No Growth Tracheal aspirate 06/26>>H influenza & strep pneumoniae  Urine 06/26>>less than 10,000 colonies/ml insignificant growth  Strep pneumoniae & Legionella pneum ur ag 06/26>>negative   Antimicrobials:   Anti-infectives (From admission, onward)    Start     Dose/Rate Route Frequency Ordered Stop   02/22/24 1100  cefTRIAXone (ROCEPHIN) 2 g in sodium chloride  0.9 % 100 mL IVPB        2 g 200 mL/hr over 30 Minutes Intravenous Every 24 hours 02/22/24 0952     02/22/24 1100  azithromycin  (ZITHROMAX ) 500 mg in sodium chloride  0.9 % 250 mL IVPB  Status:  Discontinued        500 mg 250 mL/hr over 60 Minutes Intravenous Every 24 hours 02/22/24 0952 02/24/24 1009   02/21/24 0000  ceFEPIme  (MAXIPIME ) 2 g in sodium chloride  0.9 % 100 mL IVPB  Status:  Discontinued        2 g 200 mL/hr over 30 Minutes Intravenous Every 12 hours 02/20/24 1051 02/22/24 0952   02/20/24 1200  vancomycin  (VANCOREADY) IVPB  2000 mg/400 mL        2,000 mg 200 mL/hr over 120 Minutes Intravenous  Once 02/20/24 1007 02/20/24 1400   02/20/24 1050  vancomycin  variable dose per unstable renal function (pharmacist dosing)  Status:  Discontinued         Does not apply See admin instructions 02/20/24 1051 02/21/24 0925   02/20/24 0845  ceFEPIme  (MAXIPIME ) 2 g in  sodium chloride  0.9 % 100 mL IVPB        2 g 200 mL/hr over 30 Minutes Intravenous  Once 02/20/24 0838 02/20/24 1410   02/20/24 0845  metroNIDAZOLE  (FLAGYL ) IVPB 500 mg        500 mg 100 mL/hr over 60 Minutes Intravenous  Once 02/20/24 0838 02/20/24 1559   02/20/24 0845  vancomycin  (VANCOCIN ) IVPB 1000 mg/200 mL premix  Status:  Discontinued        1,000 mg 200 mL/hr over 60 Minutes Intravenous  Once 02/20/24 0838 02/20/24 1007       Significant Hospital Events: Including procedures, antibiotic start and stop dates in addition to other pertinent events   06/26: Pt admitted post cardiac arrest mechanically intubated with possible cardiogenic/possible septic shock with multiorgan failure requiring levophed  and dobutamine  gtts 06/27: on minimal sedation, discontinued this AM. Off nor-epi, continued on dobutamine  06/28: decreased dobutamine  to 2.5, remains off sedation 06/29: arouses to tactile and verbal stimuli, remains disoriented. Failed SBT this AM. Dobutamine  increased to 5 mcg overnight due to bradycardia 06/30: Pt remains mechanically intubated, however neuro exam precludes extubation.  He has not received sedation since 06/27. Dobutamine  discontinued  06/30: MRI Brain Since the previous MRI, the patient has developed chronic encephalomalacia changes within the right medial occipital lobe compatible with an interval PCA distribution infarct. Interval development of encephalomalacia in area of previously demonstrated left MCA distribution infarct, which was acute on the previous MRI. 07/01: Overnight pt remained in atrial flutter with rvr hr 140 to 150's with possible ST elevation despite iv metoprolol .  Cardiologist Dr Darron notified recommended amiodarone bolus followed by amiodarone gtt for rate control.  However, this morning while on amiodarone gtt pt became severely bradycardic hr as low as 27 bpm with associated hypotension, amiodarone gtt discontinued he is now requiring low dose  levophed  gtt.  Self extubated.  Delirious requiring Precedex . 07/02: No significant events noted overnight.  Remains on Precedex  due to delirium, currently protecting his airway.  Afebrile, hemodynamically stable, Levophed  weaned off, Bradycardia resolved, no reports of tachyarrhythmias.  Dobutamine  weaned to 2.5 and Coox remains stable at 83, will d/c Dobutamine .  Speech/PT/OT consulted.  Failed speech evaluation, plan to place Dobhoff as able, remove foley.  Interim History / Subjective:  As outlined above under Significant Hospital Events section  Objective   Blood pressure 124/73, pulse 66, temperature (!) 96.4 F (35.8 C), temperature source Bladder, resp. rate 16, height 6' (1.829 m), weight 91.9 kg, SpO2 100%. CVP:  [13 mmHg-30 mmHg] 15 mmHg  Vent Mode: PSV;CPAP FiO2 (%):  [40 %-50 %] 40 % Set Rate:  [18 bmp] 18 bmp Vt Set:  [500 mL] 500 mL PEEP:  [5 cmH20-8 cmH20] 5 cmH20 Pressure Support:  [5 cmH20] 5 cmH20 Plateau Pressure:  [21 cmH20] 21 cmH20   Intake/Output Summary (Last 24 hours) at 02/26/2024 0657 Last data filed at 02/26/2024 0600 Gross per 24 hour  Intake 1498.09 ml  Output 2520 ml  Net -1021.91 ml   Filed Weights   02/24/24 0400 02/25/24 0400 02/26/24 0500  Weight:  92.4 kg 90.1 kg 91.9 kg    Examination: General: Acutely ill appearing male, laying in bed, on 4L [place], lightly sedated on Precedex , in NAD HENT: Atraumatic, normocephalic, neck supple, no JVD Lungs: Clear breath sounds throughout, even, nonlabored, normal effort Cardiovascular: RRR, no M/R/G Abdomen: Soft, nontender, nondistended, no guarding or rebound tenderness, BS+ x4 Extremities: Normal bulk and tone, no deformities, no cyanosis, 1+ edema bilateral LE Neuro: Lightly sedated on 0.5 mcg Precedex , arouses easily to voice, moves all extremities to commands with no focal deficits noted, does not answer orientation questions, pupils PERRL GU: Foley catheter in place draining yellow urine  Resolved  Hospital Problem list   Hyponatremia  Lactic acidosis  Hyperkalemia   Assessment & Plan:   #Cardiac arrest (PEA)  #Cardiogenic/possible septic shock~resolved   #Acute on chronic Biventricular CHF #Elevated troponin suspect secondary to demand ischemia vs NSTEMI #Possible mural thrombus  Hx: CVA with aphasia  CTA Chest PE 02/20/24: No evidence of acute pulmonary embolism. Rounded filling defect within the right atrial appendage, suspicious for thrombus. Echo 02/20/24: EF 20 to 25%, indeterminate diastolic parameters, possible mural thrombus in apical region, RV systolic function is severely reduced, RV size moderately enlarged -Continuous cardiac monitoring -Maintain MAP >65 -Vasopressors as needed to maintain MAP goal ~ weaned off -Follow Coox and CVP -Will d/c Dobutamine  on 7/2 -HS Troponin peaked at 2,551 -Repeat Echocardiogram 06/30: no definitive LV thrombus -Diuresis as BP and renal function permits ~ Diuretics held 06/30 due to CVP of 9 -Continue Heparin  gtt: dosing per pharmacy -Cardiology following, appreciate input ~ considering cardiac cath at some point ~ Per Cardiology pt is NOT a good candidate for advanced therapies such as ICD and LVAD due to polysubstance abuse hx and medication noncompliance  #Acute respiratory failure  #Bilateral pleural effusions right greater than left  #H influenza and strep pneumoniae Pneumonia Hx: Tobacco abuse  SELF-EXTUBATED 02/25/24 -Supplemental O2 as needed to maintain O2 sats >92% -Follow intermittent Chest X-ray & ABG as needed -Bronchodilators PRN  -ABX as above -Diuresis as BP and renal function permits -Pulmonary toilet as able  #Sepsis #H. Influenza & Strep Pneumoniae Pneumonia -Monitor fever curve -Trend WBC's -Follow cultures as above -Continue empiric Ceftriaxone pending cultures & sensitivities (plan for 5 day course)  #Acute Kidney Injury secondary to ATN ~ RESOLVED -Monitor I&O's / urinary output -Follow  BMP -Ensure adequate renal perfusion -Avoid nephrotoxic agents as able -Replace electrolytes as indicated ~ Pharmacy following for assistance with electrolyte replacement  #Shock liver  #Elevated alk phos and total bilirubin  CT Abd/Pelvis 06/26: Mild diffuse mural thickening, which may be seen in the setting of hypoperfusion. Small volume free fluid. HCV ab 06/26: reactive  - Trend hepatic function panel & coags - Avoid hepatotoxic agents as able   #Anemia without signs of bleeding  -Trend CBC  -Monitor for s/sx of bleeding  -Transfuse fo hgb <7   #Hypoglycemia  -CBG's q4hrs for now  -Follow hypo/hyperglycemic protocol -Target CBG range 140 to 180  #Acute metabolic encephalopathy  Hx: Polysubstance abuse, depression, and schizoaffective disorder EEG 02/21/24: Study is suggestive of severe to profound diffuse encephalopathy. No seizures or epileptiform discharges were seen throughout the recording. MRI Brain 02/24/24: Since the previous MRI, the patient has developed chronic encephalomalacia changes within the right medial occipital lobe compatible with an interval PCA distribution infarct. Interval development of encephalomalacia in area of previously demonstrated left MCA distribution infarct, which was acute on the previous MRI. -UDS was negative -Treatment of  metabolic derangements as outlined above -Provide supportive care -Promote normal sleep/wake cycle and family presence -Avoid sedating medications as able -Continue Precedex  as needed, wean as able   Pt is critically ill with multiorgan failure. Prognosis is guarded, high risk for further decompensation, cardiac arrest, and death.  Given current critical illness superimposed on multiple chronic co-morbidities and advanced age, overall long term prognosis is poor.  Recommend consideration of DNR/DNI status.  Palliative Care following to assist with GOC discussions.   Best Practice (right click and Reselect all SmartList  Selections daily)   Diet/type: NPO, failed speech eval, will attempt to place Dobhoff and start tube feeds DVT prophylaxis: systemic heparin  GI prophylaxis: PPI Lines: Central line and yes and it is still needed Foley:  yes, will remove on 7/2 Code Status:  full code Last date of multidisciplinary goals of care discussion [7/2]  7/8: Will update pt's family when they arrive at bedside on plan of care.  Labs   CBC: Recent Labs  Lab 02/20/24 0751 02/20/24 1223 02/20/24 2256 02/21/24 0420 02/22/24 0444 02/23/24 0422 02/24/24 0354 02/25/24 0304 02/26/24 0401  WBC 14.7* 18.0* 17.7*   < > 20.2* 23.6* 20.9* 12.3* 11.9*  NEUTROABS 12.7* 16.5* 15.9*  --   --   --   --   --   --   HGB 11.9* 12.8* 12.1*   < > 12.9* 12.0* 11.9* 11.4* 11.7*  HCT 37.3* 39.7 36.2*   < > 37.4* 35.7* 35.8* 35.8* 36.4*  MCV 102.5* 97.5 95.3   < > 91.9 93.7 95.5 97.0 98.4  PLT 355 351 311   < > 286 253 238 246 250   < > = values in this interval not displayed.    Basic Metabolic Panel: Recent Labs  Lab 02/22/24 0444 02/23/24 0422 02/23/24 1518 02/24/24 0354 02/24/24 1146 02/25/24 0304 02/26/24 0401  NA 133* 137 138 141  --  143 141  K 4.1 4.1 4.1 4.3  --  4.0 4.6  CL 94* 97* 100 98  --  103 100  CO2 29 32 30 33*  --  32 31  GLUCOSE 115* 106* 111* 117*  --  117* 85  BUN 68* 59* 53* 52*  --  52* 43*  CREATININE 1.77* 1.21 1.11 1.04  --  0.89 0.97  CALCIUM  7.7* 7.6* 7.4* 7.9*  --  8.2* 8.2*  MG 2.7* 2.8* 2.4  --  2.4 2.7* 2.2  PHOS 3.4 2.6  --  2.8  --  2.3* 4.3   GFR: Estimated Creatinine Clearance: 76.7 mL/min (by C-G formula based on SCr of 0.97 mg/dL). Recent Labs  Lab 02/20/24 2028 02/20/24 2256 02/21/24 0420 02/22/24 0444 02/22/24 0802 02/23/24 0422 02/24/24 0354 02/25/24 0304 02/26/24 0401  WBC  --  17.7* 19.3*   < >  --  23.6* 20.9* 12.3* 11.9*  LATICACIDVEN 5.7* 4.0* 2.1*  --  1.3  --   --   --   --    < > = values in this interval not displayed.    Liver Function  Tests: Recent Labs  Lab 02/20/24 0751 02/21/24 0420 02/22/24 0444 02/23/24 0422 02/24/24 0354 02/25/24 0305  AST 3,301* 2,959*  --   --   --  146*  ALT 2,038* 2,111*  --   --   --  564*  ALKPHOS 134* 119  --   --   --  84  BILITOT 5.5* 4.9*  --   --   --  1.5*  PROT 5.7* 5.2*  --   --   --  5.3*  ALBUMIN 2.7* 2.6* 2.4* 2.5* 2.6* 2.4*   No results for input(s): LIPASE, AMYLASE in the last 168 hours. Recent Labs  Lab 02/25/24 1046  AMMONIA 31    ABG    Component Value Date/Time   PHART 7.35 02/20/2024 1452   PCO2ART 45 02/20/2024 1452   PO2ART 59 (L) 02/20/2024 1452   HCO3 24.8 02/20/2024 1452   ACIDBASEDEF 1.1 02/20/2024 1452   O2SAT 83.3 02/26/2024 0440     Coagulation Profile: Recent Labs  Lab 02/20/24 1344 02/20/24 2256 02/21/24 0420 02/26/24 0401  INR 3.4* 2.5* 2.8* 1.2    Cardiac Enzymes: No results for input(s): CKTOTAL, CKMB, CKMBINDEX, TROPONINI in the last 168 hours.  HbA1C: Hgb A1c MFr Bld  Date/Time Value Ref Range Status  07/14/2018 04:29 AM 5.8 (H) 4.8 - 5.6 % Final    Comment:    (NOTE) Pre diabetes:          5.7%-6.4% Diabetes:              >6.4% Glycemic control for   <7.0% adults with diabetes   07/13/2018 04:25 AM 6.2 (H) 4.8 - 5.6 % Final    Comment:    (NOTE) Pre diabetes:          5.7%-6.4% Diabetes:              >6.4% Glycemic control for   <7.0% adults with diabetes     CBG: Recent Labs  Lab 02/25/24 1117 02/25/24 1626 02/25/24 1932 02/25/24 2336 02/26/24 0357  GLUCAP 108* 89 101* 82 84    Review of Systems:   Unable to assess due to AMS  Past Medical History:  He,  has a past medical history of Biventricular failure (HCC), CVA (cerebral vascular accident) (HCC), Polysubstance use disorder, and Schizophrenia (HCC).   Surgical History:   Past Surgical History:  Procedure Laterality Date   APPENDECTOMY       Social History:   reports that he has never smoked. He has never used smokeless  tobacco. He reports that he does not drink alcohol and does not use drugs.   Family History:  His Family history is unknown by patient.   Allergies No Known Allergies   Home Medications  Prior to Admission medications   Medication Sig Start Date End Date Taking? Authorizing Provider  ARISTADA 882 MG/3.2ML prefilled syringe Inject 882 mg into the muscle every 30 (thirty) days. 08/23/22  Yes [provider]  atorvastatin  (LIPITOR) 40 MG tablet Take 1 tablet (40 mg total) by mouth daily at 6 PM. 07/15/18  Yes Laurence Bridegroom, MD  furosemide  (LASIX ) 40 MG tablet Take 1 tablet (40 mg total) by mouth daily. 06/19/22 02/20/24 Yes Arlander Charleston, MD  losartan (COZAAR) 25 MG tablet Take 25 mg by mouth daily. 02/10/24  Yes [provider]     Critical care time: 45 minutes     Inge Lecher, AGACNP-BC  Pulmonary & Critical Care Prefer epic messenger for cross cover needs If after hours, please call E-link

## 2024-02-26 NOTE — Progress Notes (Signed)
 Daily Progress Note   Patient Name: Miguel [name]       Date: 02/26/2024 DOB: 1951/02/06  Age: 73 y.o. MRN#: [id number] Attending Physician: Malka Domino, MD Primary Care Physician: [facility] Admit Date: 02/20/2024  Reason for Consultation/Follow-up: Establishing goals of care  Subjective: Notes and labs reviewed.  To see patient.  He is currently resting in bed at this time, mittens and Precedex  in place.  Upon walking and speaking to him, he opens his eyes.  He reaches out to take and hold my hand.   Patient's sounds extremely congested.  He does strongly cough multiple times on command but has difficulty mobilizing secretions.  He nods his head he would like to continue current care to try to live longer. He attempts to pull off his mittens but immediately stops doing so with explanation of the purpose.  He also tries to get out of bed but again stops with explanation.  Nursing attempted bedside.  He is amenable to a feeding tube which was placed by nursing..  Attempted to call his sister unsuccessfully.  Will reattempt at another time.  Length of Stay: 6  Current Medications: Scheduled Meds:   amiodarone  400 mg Per Tube BID   Chlorhexidine  Gluconate Cloth  6 each Topical Daily   multivitamin with minerals  1 tablet Per Tube Daily   pantoprazole  (PROTONIX ) IV  40 mg Intravenous QHS   sodium chloride  flush  10-40 mL Intracatheter Q12H   thiamine  (VITAMIN B1) injection  100 mg Intravenous Daily    Continuous Infusions:  cefTRIAXone (ROCEPHIN)  IV 2 g (02/26/24 1118)   dexmedetomidine  (PRECEDEX ) IV infusion 0.5 mcg/kg/hr (02/26/24 0936)   heparin  1,100 Units/hr (02/26/24 1406)    PRN Meds: bisacodyl, docusate sodium , polyethylene glycol, sodium  chloride flush  Physical Exam Pulmonary:     Comments: Some work of breathing noted.  Coughs on command. Skin:    General: Skin is warm and dry.  Neurological:     Mental Status: He is alert.             Vital Signs: BP (!) 87/57   Pulse (!) 58   Temp (!) 97 F (36.1 C)   Resp 16   Ht 6' (1.829 m)   Wt 91.9 kg   SpO2 100%  BMI 27.48 kg/m  SpO2: SpO2: 100 % O2 Device: O2 Device: Nasal Cannula O2 Flow Rate: O2 Flow Rate (L/min): 4 L/min  Intake/output summary:  Intake/Output Summary (Last 24 hours) at 02/26/2024 1447 Last data filed at 02/26/2024 9063 Gross per 24 hour  Intake 1264.42 ml  Output 1745 ml  Net -480.58 ml   LBM: Last BM Date : 02/25/24 Baseline Weight: Weight: 103.4 kg Most recent weight: Weight: 91.9 kg    Patient Active Problem List   Diagnosis Date Noted   Cardiac arrest (HCC) 02/20/2024   Malnutrition of moderate degree 02/20/2024   Aspiration pneumonia of both lungs due to gastric secretions (HCC) 02/20/2024   HFrEF (heart failure with reduced ejection fraction) (HCC) 02/20/2024   LV (left ventricular) mural thrombus 02/20/2024   Elevated troponin 02/20/2024   Transaminitis 02/20/2024   Dilated cardiomyopathy (HCC) 02/20/2024   Schizoaffective disorder, bipolar type (HCC) 09/14/2022   Hypertensive emergency 09/13/2022   Bilateral pleural effusion 09/13/2022   Cocaine use disorder (HCC) 09/13/2022   Acute on chronic systolic CHF (congestive heart failure) (HCC) 09/10/2022   Acute respiratory failure with hypoxia (HCC) 09/10/2022   Stroke (HCC) 07/12/2018   Noncompliance 04/11/2016    Palliative Care Assessment & Plan    Recommendations/Plan: Patient nods his head that he is amenable to continuing life-prolonging care.    Attempted unsuccessfully to reach his sister.  Per notes updates have been provided to her by CCM.  Code Status:    Code Status Orders  (From admission, onward)           Start     Ordered   02/20/24 0934   Full code  Continuous       Question:  By:  Answer:  Default: patient does not have capacity for decision making, no surrogate or prior directive available   02/20/24 0935           Code Status History     Date Active Date Inactive Code Status Order ID Comments User Context   09/10/2022 1100 09/15/2022 2253 Full Code [id number]  Maranda Lonell MATSU, NP ED   07/12/2018 2318 07/15/2018 1504 Full Code [id number]  Jenel Lenis, MD ED     Yes Care plan was discussed with nursing  Thank you for allowing the Palliative Medicine Team to assist in the care of this patient.    Camelia Lewis, NP  Please contact Palliative Medicine Team phone at [phone] for questions and concerns.

## 2024-02-26 NOTE — Progress Notes (Signed)
 Rounding Note   Patient Name: Miguel [name] Date of Encounter: 02/26/2024  [facility] Cardiologist: None   Subjective Patient self extubated yesterday evening. He is more alert this morning although he is not following commands.   Scheduled Meds:  amiodarone  400 mg Per Tube BID   Chlorhexidine  Gluconate Cloth  6 each Topical Daily   multivitamin with minerals  1 tablet Per Tube Daily   pantoprazole  (PROTONIX ) IV  40 mg Intravenous QHS   sodium chloride  flush  10-40 mL Intracatheter Q12H   thiamine  (VITAMIN B1) injection  100 mg Intravenous Daily   Continuous Infusions:  cefTRIAXone (ROCEPHIN)  IV Stopped (02/25/24 1233)   dexmedetomidine  (PRECEDEX ) IV infusion 0.5 mcg/kg/hr (02/26/24 0743)   heparin  1,250 Units/hr (02/26/24 0600)   PRN Meds: bisacodyl, docusate sodium , polyethylene glycol, sodium chloride  flush   Vital Signs  Vitals:   02/26/24 0744 02/26/24 0745 02/26/24 0800 02/26/24 0900  BP:   125/73 (!) 84/60  Pulse:   96 (!) 58  Resp:   20 15  Temp:   (!) 96.6 F (35.9 C) (!) 96.4 F (35.8 C)  TempSrc:      SpO2: 100% 100% 100% 100%  Weight:      Height:        Intake/Output Summary (Last 24 hours) at 02/26/2024 0908 Last data filed at 02/26/2024 0600 Gross per 24 hour  Intake 1329.33 ml  Output 2620 ml  Net -1290.67 ml      02/26/2024    5:00 AM 02/25/2024    4:00 AM 02/24/2024    4:00 AM  Last 3 Weights  Weight (lbs) 202 lb 9.6 oz 198 lb 10.2 oz 203 lb 11.3 oz  Weight (kg) 91.9 kg 90.1 kg 92.4 kg      Telemetry Sinus rhythm with occasional PVCs, one brief run of atrial tachycardia and NSVT of 4 beats - Personally Reviewed  Physical Exam  GEN: No acute distress.   Neck: Difficult to assess JVD Cardiac: RRR, no murmurs, rubs, or gallops.  Respiratory: Clear to auscultation bilaterally. GI: Soft, nontender, non-distended  MS: No edema; No deformity.  Labs High Sensitivity Troponin:   Recent Labs  Lab 02/20/24 1007 02/20/24 1223  02/20/24 2106 02/20/24 2330 02/21/24 0420  TROPONINIHS 528* 939* 2,286* 2,551* 2,304*     Chemistry Recent Labs  Lab 02/21/24 0420 02/22/24 0444 02/24/24 0354 02/24/24 1146 02/25/24 0304 02/25/24 0305 02/26/24 0401  NA 131*   < > 141  --  143  --  141  K 4.2   < > 4.3  --  4.0  --  4.6  CL 94*   < > 98  --  103  --  100  CO2 26   < > 33*  --  32  --  31  GLUCOSE 86   < > 117*  --  117*  --  85  BUN 60*   < > 52*  --  52*  --  43*  CREATININE 2.24*   < > 1.04  --  0.89  --  0.97  CALCIUM  7.7*   < > 7.9*  --  8.2*  --  8.2*  MG 2.5*   < >  --  2.4 2.7*  --  2.2  PROT 5.2*  --   --   --   --  5.3* 5.9*  ALBUMIN 2.6*   < > 2.6*  --   --  2.4* 2.5*  AST 2,959*  --   --   --   --  146* 116*  ALT 2,111*  --   --   --   --  564* 454*  ALKPHOS 119  --   --   --   --  84 78  BILITOT 4.9*  --   --   --   --  1.5* 1.5*  GFRNONAA 31*   < > >60  --  >60  --  >60  ANIONGAP 11   < > 10  --  8  --  10   < > = values in this interval not displayed.    Lipids  Recent Labs  Lab 02/25/24 0304  TRIG 53    Hematology Recent Labs  Lab 02/24/24 0354 02/25/24 0304 02/26/24 0401  WBC 20.9* 12.3* 11.9*  RBC 3.75* 3.69* 3.70*  HGB 11.9* 11.4* 11.7*  HCT 35.8* 35.8* 36.4*  MCV 95.5 97.0 98.4  MCH 31.7 30.9 31.6  MCHC 33.2 31.8 32.1  RDW 14.6 15.0 15.2  PLT 238 246 250   Thyroid No results for input(s): TSH, FREET4 in the last 168 hours.  BNP Recent Labs  Lab 02/20/24 0753  BNP 819.5*    DDimer No results for input(s): DDIMER in the last 168 hours.   Radiology  MR BRAIN WO CONTRAST Result Date: 02/24/2024 IMPRESSION: 1. Since the previous MRI, the patient has developed chronic encephalomalacia changes within the right medial occipital lobe compatible with an interval PCA distribution infarct. 2. Interval development of encephalomalacia in area of previously demonstrated left MCA distribution infarct, which was acute on the previous MRI. Electronically Signed   By: Evalene Coho M.D.   On: 02/24/2024 15:34    Cardiac Studies  02/24/2024 Echo complete  1. No definitive LV thrombus.   2. This is a limited study.   3. Left ventricular ejection fraction, by estimation, is 20%. The left  ventricle has severely decreased function. The left ventricle demonstrates  regional wall motion abnormalities (see scoring diagram/findings for  description).   Patient Profile   73 y.o. male with a history of biventricular failure, EF 20-25%, schizoaffective disorder, cocaine use presenting with out of hospital cardiac arrest.   Assessment & Plan   Cardiac arrest - Out of hospital cardiac arrest with postresuscitative cardiogenic shock due to severe biventricular failure with uncertain downtime - Neurologic status seems to be gradually improving.  Patient self extubated yesterday and is more alert today, although not following commands. - Recommend right and left heart catheterization prior to discharge if patient makes meaningful neurologic recovery  Cardiogenic shock and biventricular heart failure - Echo this admission with no definitive LV thrombus and EF 20%, defer anticoagulation at this time - Levophed  and dobutamine  weaned per primary team - Hypotension precludes initiation of GDMT at this time  Acute kidney injury - Kidney function improving - Avoid nephrotoxic agents  For questions or updates, please contact [facility] Please consult [email or web address] for contact info under     Signed, Lesley LITTIE Maffucci, PA-C  02/26/2024, 9:08 AM

## 2024-02-27 ENCOUNTER — Inpatient Hospital Stay

## 2024-02-27 DIAGNOSIS — E440 Moderate protein-calorie malnutrition: Secondary | ICD-10-CM

## 2024-02-27 DIAGNOSIS — I469 Cardiac arrest, cause unspecified: Secondary | ICD-10-CM | POA: Diagnosis not present

## 2024-02-27 DIAGNOSIS — I5020 Unspecified systolic (congestive) heart failure: Secondary | ICD-10-CM

## 2024-02-27 DIAGNOSIS — K6389 Other specified diseases of intestine: Secondary | ICD-10-CM | POA: Diagnosis not present

## 2024-02-27 DIAGNOSIS — Z7189 Other specified counseling: Secondary | ICD-10-CM | POA: Diagnosis not present

## 2024-02-27 DIAGNOSIS — B963 Hemophilus influenzae [H. influenzae] as the cause of diseases classified elsewhere: Secondary | ICD-10-CM | POA: Diagnosis not present

## 2024-02-27 DIAGNOSIS — Z4682 Encounter for fitting and adjustment of non-vascular catheter: Secondary | ICD-10-CM | POA: Diagnosis not present

## 2024-02-27 DIAGNOSIS — G9341 Metabolic encephalopathy: Secondary | ICD-10-CM | POA: Diagnosis not present

## 2024-02-27 DIAGNOSIS — Z452 Encounter for adjustment and management of vascular access device: Secondary | ICD-10-CM | POA: Diagnosis not present

## 2024-02-27 DIAGNOSIS — I420 Dilated cardiomyopathy: Secondary | ICD-10-CM | POA: Diagnosis not present

## 2024-02-27 DIAGNOSIS — J690 Pneumonitis due to inhalation of food and vomit: Secondary | ICD-10-CM

## 2024-02-27 DIAGNOSIS — R918 Other nonspecific abnormal finding of lung field: Secondary | ICD-10-CM | POA: Diagnosis not present

## 2024-02-27 DIAGNOSIS — R7989 Other specified abnormal findings of blood chemistry: Secondary | ICD-10-CM | POA: Diagnosis not present

## 2024-02-27 DIAGNOSIS — I5023 Acute on chronic systolic (congestive) heart failure: Secondary | ICD-10-CM | POA: Diagnosis not present

## 2024-02-27 DIAGNOSIS — R7401 Elevation of levels of liver transaminase levels: Secondary | ICD-10-CM | POA: Diagnosis not present

## 2024-02-27 DIAGNOSIS — I513 Intracardiac thrombosis, not elsewhere classified: Secondary | ICD-10-CM | POA: Diagnosis not present

## 2024-02-27 DIAGNOSIS — J9601 Acute respiratory failure with hypoxia: Secondary | ICD-10-CM | POA: Diagnosis not present

## 2024-02-27 LAB — BLOOD GAS, ARTERIAL
Acid-Base Excess: 10.3 mmol/L — ABNORMAL HIGH (ref 0.0–2.0)
Bicarbonate: 37 mmol/L — ABNORMAL HIGH (ref 20.0–28.0)
O2 Content: 4 L/min
O2 Saturation: 98.7 %
Patient temperature: 37
pCO2 arterial: 57 mmHg — ABNORMAL HIGH (ref 32–48)
pH, Arterial: 7.42 (ref 7.35–7.45)
pO2, Arterial: 102 mmHg (ref 83–108)

## 2024-02-27 LAB — CBC
HCT: 28.6 % — ABNORMAL LOW (ref 39.0–52.0)
Hemoglobin: 9 g/dL — ABNORMAL LOW (ref 13.0–17.0)
MCH: 31.8 pg (ref 26.0–34.0)
MCHC: 31.5 g/dL (ref 30.0–36.0)
MCV: 101.1 fL — ABNORMAL HIGH (ref 80.0–100.0)
Platelets: 248 10*3/uL (ref 150–400)
RBC: 2.83 MIL/uL — ABNORMAL LOW (ref 4.22–5.81)
RDW: 14.9 % (ref 11.5–15.5)
WBC: 12.8 10*3/uL — ABNORMAL HIGH (ref 4.0–10.5)
nRBC: 0.4 % — ABNORMAL HIGH (ref 0.0–0.2)

## 2024-02-27 LAB — GLUCOSE, CAPILLARY
Glucose-Capillary: 103 mg/dL — ABNORMAL HIGH (ref 70–99)
Glucose-Capillary: 106 mg/dL — ABNORMAL HIGH (ref 70–99)
Glucose-Capillary: 197 mg/dL — ABNORMAL HIGH (ref 70–99)
Glucose-Capillary: 22 mg/dL — CL (ref 70–99)
Glucose-Capillary: 39 mg/dL — CL (ref 70–99)
Glucose-Capillary: 88 mg/dL (ref 70–99)
Glucose-Capillary: 91 mg/dL (ref 70–99)
Glucose-Capillary: 92 mg/dL (ref 70–99)
Glucose-Capillary: 99 mg/dL (ref 70–99)

## 2024-02-27 LAB — COOXEMETRY PANEL
Carboxyhemoglobin: 2.2 % — ABNORMAL HIGH (ref 0.5–1.5)
Methemoglobin: <0.7 % (ref 0.0–1.5)
O2 Saturation: 73.8 %
Total hemoglobin: 11.1 g/dL — ABNORMAL LOW (ref 12.0–16.0)
Total oxygen content: 71.8 %

## 2024-02-27 LAB — COMPREHENSIVE METABOLIC PANEL WITH GFR
ALT: 333 U/L — ABNORMAL HIGH (ref 0–44)
AST: 87 U/L — ABNORMAL HIGH (ref 15–41)
Albumin: 2.5 g/dL — ABNORMAL LOW (ref 3.5–5.0)
Alkaline Phosphatase: 68 U/L (ref 38–126)
Anion gap: 7 (ref 5–15)
BUN: 40 mg/dL — ABNORMAL HIGH (ref 8–23)
CO2: 34 mmol/L — ABNORMAL HIGH (ref 22–32)
Calcium: 8.2 mg/dL — ABNORMAL LOW (ref 8.9–10.3)
Chloride: 101 mmol/L (ref 98–111)
Creatinine, Ser: 0.77 mg/dL (ref 0.61–1.24)
GFR, Estimated: >60 mL/min (ref >60)
Glucose, Bld: 98 mg/dL (ref 70–99)
Potassium: 4.7 mmol/L (ref 3.5–5.1)
Sodium: 142 mmol/L (ref 135–145)
Total Bilirubin: 1.5 mg/dL — ABNORMAL HIGH (ref 0.0–1.2)
Total Protein: 6 g/dL — ABNORMAL LOW (ref 6.5–8.1)

## 2024-02-27 LAB — MAGNESIUM: Magnesium: 2.2 mg/dL (ref 1.7–2.4)

## 2024-02-27 LAB — PHOSPHORUS: Phosphorus: 3.9 mg/dL (ref 2.5–4.6)

## 2024-02-27 LAB — LACTIC ACID, PLASMA: Lactic Acid, Venous: 1.1 mmol/L (ref 0.5–1.9)

## 2024-02-27 LAB — HEPARIN LEVEL (UNFRACTIONATED): Heparin Unfractionated: 0.54 [IU]/mL (ref 0.30–0.70)

## 2024-02-27 MED ORDER — AMIODARONE HCL IN DEXTROSE 360-4.14 MG/200ML-% IV SOLN
30.0000 mg/h | INTRAVENOUS | Status: DC
  Administered 2024-02-27 – 2024-03-01 (×5): 30 mg/h via INTRAVENOUS
  Filled 2024-02-27 (×5): qty 200

## 2024-02-27 MED ORDER — AMIODARONE IV BOLUS ONLY 150 MG/100ML
150.0000 mg | Freq: Once | INTRAVENOUS | Status: AC
  Administered 2024-02-27: 150 mg via INTRAVENOUS
  Filled 2024-02-27: qty 100

## 2024-02-27 MED ORDER — FUROSEMIDE 10 MG/ML IJ SOLN
20.0000 mg | Freq: Once | INTRAMUSCULAR | Status: AC
  Administered 2024-02-27: 20 mg via INTRAVENOUS
  Filled 2024-02-27: qty 2

## 2024-02-27 MED ORDER — METOPROLOL TARTRATE 5 MG/5ML IV SOLN
2.5000 mg | Freq: Once | INTRAVENOUS | Status: DC
  Filled 2024-02-27: qty 5

## 2024-02-27 MED ORDER — SODIUM CHLORIDE 0.9 % IV SOLN: 250.0000 mL | INTRAVENOUS | Status: AC

## 2024-02-27 MED ORDER — QUETIAPINE FUMARATE 25 MG PO TABS
25.0000 mg | ORAL_TABLET | Freq: Two times a day (BID) | ORAL | Status: DC
  Administered 2024-02-27: 25 mg
  Filled 2024-02-27 (×2): qty 1

## 2024-02-27 MED ORDER — NOREPINEPHRINE 4 MG/250ML-% IV SOLN
0.0000 ug/min | INTRAVENOUS | Status: DC
  Administered 2024-02-27: 2 ug/min via INTRAVENOUS
  Administered 2024-02-28: 8 ug/min via INTRAVENOUS
  Administered 2024-02-28: 7 ug/min via INTRAVENOUS
  Administered 2024-02-29: 9 ug/min via INTRAVENOUS
  Filled 2024-02-27 (×4): qty 250

## 2024-02-27 MED ORDER — SODIUM CHLORIDE 3 % IN NEBU
4.0000 mL | INHALATION_SOLUTION | Freq: Two times a day (BID) | RESPIRATORY_TRACT | Status: DC
  Administered 2024-02-27 – 2024-02-29 (×5): 4 mL via RESPIRATORY_TRACT
  Filled 2024-02-27 (×6): qty 4

## 2024-02-27 MED ORDER — SODIUM CHLORIDE 0.9 % IV BOLUS
500.0000 mL | Freq: Once | INTRAVENOUS | Status: AC
  Administered 2024-02-27: 500 mL via INTRAVENOUS

## 2024-02-27 MED ORDER — AMIODARONE HCL IN DEXTROSE 360-4.14 MG/200ML-% IV SOLN
60.0000 mg/h | INTRAVENOUS | Status: AC
  Administered 2024-02-27: 60 mg/h via INTRAVENOUS
  Filled 2024-02-27: qty 200

## 2024-02-27 MED ORDER — IPRATROPIUM-ALBUTEROL 0.5-2.5 (3) MG/3ML IN SOLN
3.0000 mL | RESPIRATORY_TRACT | Status: DC
  Administered 2024-02-27 – 2024-02-28 (×4): 3 mL via RESPIRATORY_TRACT
  Filled 2024-02-27 (×4): qty 3

## 2024-02-27 MED ORDER — DEXTROSE 50 % IV SOLN
25.0000 g | INTRAVENOUS | Status: AC
  Administered 2024-02-27: 25 g via INTRAVENOUS
  Filled 2024-02-27: qty 50

## 2024-02-27 MED ORDER — HALOPERIDOL LACTATE 5 MG/ML IJ SOLN
2.0000 mg | Freq: Four times a day (QID) | INTRAMUSCULAR | Status: DC | PRN
  Administered 2024-02-28 – 2024-02-29 (×3): 2 mg via INTRAVENOUS
  Filled 2024-02-27 (×3): qty 1

## 2024-02-27 MED ORDER — ENOXAPARIN SODIUM 40 MG/0.4ML IJ SOSY
40.0000 mg | PREFILLED_SYRINGE | INTRAMUSCULAR | Status: DC
  Administered 2024-02-27: 40 mg via SUBCUTANEOUS
  Filled 2024-02-27: qty 0.4

## 2024-02-27 MED ORDER — SODIUM CHLORIDE 0.9 % IV BOLUS: 500.0000 mL | Freq: Once | INTRAVENOUS | Status: DC

## 2024-02-27 NOTE — TOC Progression Note (Signed)
 Transition of Care [facility]) - Progression Note    Patient Details  Name: Miguel [name] MRN: [id number] Date of Birth: Dec 29, 1950  Transition [facility] Care [facility]) CM/SW Contact  Miguel Bow A Masiyah Jorstad, RN Phone Number: 02/27/2024, 11:46 AM  Clinical Narrative:    Chart reviewed.  Noted that patient was admitted with Cardiac arrest.  Patient arrived from jail on admission.  Noted that patient has hx of schizophrenia, depression, smoker, chronic chf, and cva with aphasia.  Patient has been on low dose Precedex  due to delirium.  Cardiology has been consulted to see patient on admission.   I have spoken with patient's sister Miguel [name].  Miguel [name] reports that patient was in jail prior to admission.  Miguel [name] reports that patient was homeless prior to going to jail.  Miguel [name] informs me that patient has had multiple apartments and has lived with family members.  However, patient has chosen to live in the streets.  Miguel [name] reports that [name] has a mental health history of moderate schizophrenia.  She feels like he also has paranoia with a fear of living on his own.  Miguel [name] reports that patient has had stays at [facility] and [facility] for his mental health issues.  Miguel [name] reports that her brother Miguel [name] is the overseer of patient's SSI check.  She is not sure what type of insurance [name] has.  She does inform me that patient has never worked due to his mental health condition. Miguel [name] reports that her brother owns a facility and the facility acts as a safe place for [name] to come and get off the streets and take a shower.  Miguel [name] reports that her family has tried everything they can to help [name].  Miguel [name] reports that patient does receive some services from the [employer] but she is not aware of what services they where providing for him.  Miguel [name] feels that due to patient's condition patient will need SNF, assisted living or possible group home placement.  Miguel [name] reports that Miguel [name], her brother does not have  POA but has been helping manage [name] affairs.  Miguel [name] feels that due to [name]'s mental health issues he she does not feel like his family can manage his needs at their homes.  She reports that [name] does not take the mental health medications because it makes him feel like a zombie and it slows him down.    I have made Miguel [name] aware that [facility] will assist with disposition planning for [name].  I will reach out to [facility] with Financial counselor and see if she is able to research if patient has Medicare/Medicaid.    TOC will continue to follow for discharge planning.        Expected Discharge Plan and Services                                               Social Determinants of Health (SDOH) Interventions SDOH Screenings   Food Insecurity: Patient Unable To Answer (02/21/2024)  Housing: Patient Unable To Answer (02/21/2024)  Transportation Needs: Patient Unable To Answer (02/21/2024)  Utilities: Patient Unable To Answer (02/21/2024)  Social Connections: Patient Unable To Answer (02/21/2024)  Tobacco Use: Low Risk  (02/20/2024)    Readmission Risk Interventions     No data to display

## 2024-02-27 NOTE — Progress Notes (Signed)
 Occupational Therapy Evaluation Patient Details Name: Miguel [name] MRN: [id number] DOB: 1951/07/05 Today's Date: 02/27/2024   History of Present Illness   Patient is a 73 year old male with out of hospital cardiac arrest with cardiogenic shock due to severe Biventricular CHF, sepsis due to H. Influenzae and Strep Pneumoniae Pneumonia, AKI, shock liver, and Acute Metabolic Encephalopathy. PMH: polysubstance abuse, schizoaffective disorder, biventricular failure, EF 20 to 25%.     Clinical Impressions Miguel [name] was seen for OT evaluation this date. Per chart, IND in ADLs. Pt presents to acute OT demonstrating impaired ADL performance and functional mobility 2/2 generalized weakness and decreased activity tolerance (See OT problem list for additional functional deficits). Pt currently requires MAX A for donning socks and MAX A +2 physical assistance for bed mobility. Pt lethargic throughout eval., multiple attempts for stimulation attempted with pt's eyes only open for 5 sec. Pt tolerated brief sitting at EOB before initiating return to supine, appearing agitated.   Pt would benefit from skilled OT services to address noted impairments and functional limitations (see below for any additional details) in order to maximize safety and independence while minimizing falls risk and caregiver burden. Anticipate the need for follow up OT services upon acute hospital DC.      If plan is discharge home, recommend the following:   Two people to help with walking and/or transfers;Two people to help with bathing/dressing/bathroom;Assist for transportation;Supervision due to cognitive status     Functional Status Assessment   Patient has had a recent decline in their functional status and/or demonstrates limited ability to make significant improvements in function in a reasonable and predictable amount of time     Equipment Recommendations   Other (comment) (defer to next venue of care)      Recommendations for Other Services         Precautions/Restrictions   Precautions Precautions: Fall Recall of Precautions/Restrictions: Impaired Restrictions Weight Bearing Restrictions Per Provider Order: No     Mobility Bed Mobility Overal bed mobility: Needs Assistance Bed Mobility: Supine to Sit, Sit to Supine     Supine to sit: Max assist, +2 for physical assistance Sit to supine: Max assist, +2 for physical assistance        Transfers                   General transfer comment: not attempted      Balance Overall balance assessment: Needs assistance Sitting-balance support: Feet supported Sitting balance-Leahy Scale: Poor Sitting balance - Comments: patient needs MIN assistance for safety. impulsive - patient attempts to return himself to be by leaning to the left                                   ADL either performed or assessed with clinical judgement   ADL Overall ADL's : Needs assistance/impaired                                       General ADL Comments: MAX A for bed level donning socks; anticipate MIN A for UB dressing, MAX A for toileting. Assessment limited by mentation this session     Vision         Perception         Praxis         Pertinent Vitals/Pain Pain  Assessment Pain Assessment: CPOT Facial Expression: Relaxed, neutral Body Movements: Protection Muscle Tension: Relaxed Compliance with ventilator (intubated pts.): N/A Vocalization (extubated pts.): Talking in normal tone or no sound CPOT Total: 1 Pain Intervention(s): Monitored during session, Repositioned, Limited activity within patient's tolerance     Extremity/Trunk Assessment Upper Extremity Assessment Upper Extremity Assessment: Difficult to assess due to impaired cognition;Generalized weakness (hand mitts placed; unable to follow commands)   Lower Extremity Assessment Lower Extremity Assessment: Defer to PT evaluation        Communication Communication Communication: Impaired Factors Affecting Communication: Difficulty expressing self   Cognition Arousal: Lethargic, Suspect due to medications Behavior During Therapy: Restless (lethargic w/ intermittent agitation) Cognition: No family/caregiver present to determine baseline, Difficult to assess Difficult to assess due to: Level of arousal                             Following commands: Impaired Following commands impaired:  (unable to follow commands - suspect r/t medication)     Cueing  General Comments   Cueing Techniques: Verbal cues;Gestural cues;Tactile cues  heart rate around 50bpm, Sp02 in the 90's on 3 L02. breathing sounds congested. bilateral mitts in place throughout session.   Exercises     Shoulder Instructions      Home Living Family/patient expects to be discharged to:: Unsure                                 Additional Comments: chart indicated patient coming from jail.      Prior Functioning/Environment Prior Level of Function : Patient poor historian/Family not available             Mobility Comments: patient was independent with mobility at baseline per prior hospital stay information. unable to state today      OT Problem List: Decreased strength;Decreased activity tolerance;Impaired balance (sitting and/or standing);Decreased cognition;Decreased safety awareness   OT Treatment/Interventions: Self-care/ADL training;Therapeutic exercise;Patient/family education      OT Goals(Current goals can be found in the care plan section)   Acute Rehab OT Goals Patient Stated Goal: pt unable to verbalize goal due to lethargy OT Goal Formulation: Patient unable to participate in goal setting Time For Goal Achievement: 03/12/24 Potential to Achieve Goals: Fair ADL Goals Pt Will Perform Grooming: with min assist;bed level Pt Will Transfer to Toilet: with mod assist;stand pivot transfer;bedside  commode Additional ADL Goal #1: Pt will demonstrate sitting tolerance of > 5 min. at EOB.   OT Frequency:  Min 1X/week    Co-evaluation PT/OT/SLP Co-Evaluation/Treatment: Yes Reason for Co-Treatment: Complexity of the patient's impairments (multi-system involvement);For patient/therapist safety;To address functional/ADL transfers PT goals addressed during session: Mobility/safety with mobility OT goals addressed during session: ADL's and self-care      AM-PAC OT 6 Clicks Daily Activity     Outcome Measure Help from another person eating meals?: A Lot Help from another person taking care of personal grooming?: A Lot Help from another person toileting, which includes using toliet, bedpan, or urinal?: A Lot Help from another person bathing (including washing, rinsing, drying)?: A Lot Help from another person to put on and taking off regular upper body clothing?: A Lot Help from another person to put on and taking off regular lower body clothing?: A Lot 6 Click Score: 12   End of Session    Activity Tolerance: Patient limited by lethargy Patient left:  in bed;with call bell/phone within reach;with bed alarm set  OT Visit Diagnosis: Unsteadiness on feet (R26.81);Other abnormalities of gait and mobility (R26.89);Muscle weakness (generalized) (M62.81);Other symptoms and signs involving cognitive function                Time: 8884-8873 OT Time Calculation (min): 11 min Charges:  OT General Charges $OT Visit: 1 Visit OT Evaluation $OT Eval Low Complexity: 1 Low  Keauna Brasel Waylan, Student OT   [employer] 02/27/2024, 1:00 PM

## 2024-02-27 NOTE — Progress Notes (Signed)
 Hypoglycemic Event  CBG: 39  Treatment: D50 50 mL (25 gm)  Symptoms: None  Follow-up CBG: Time:0916 AM CBG Result:197  Possible Reasons for Event: Inadequate meal intake  Comments/MD notified:yes    Miguel [name]

## 2024-02-27 NOTE — Progress Notes (Signed)
 Rounding Note   Patient Name: Miguel [name] Date of Encounter: 02/27/2024  [facility] Cardiologist: new to [facility]  Subjective Minimally arousable, hands in mitts. Would not open eyes on stimulation Remains on Precedex  Self extubated himself 2 days ago Per nursing, was more alert this morning Has pulled out NG tube Telemetry reviewed, maintaining normal sinus rhythm   Scheduled Meds:  Chlorhexidine  Gluconate Cloth  6 each Topical Daily   enoxaparin  (LOVENOX ) injection  40 mg Subcutaneous Q24H   feeding supplement (PROSource TF20)  60 mL Per Tube Daily   free water   30 mL Per Tube Q4H   multivitamin with minerals  1 tablet Per Tube Daily   pantoprazole  (PROTONIX ) IV  40 mg Intravenous QHS   QUEtiapine  25 mg Per Tube BID   sodium chloride  flush  10-40 mL Intracatheter Q12H   thiamine  (VITAMIN B1) injection  100 mg Intravenous Daily   Continuous Infusions:  dexmedetomidine  (PRECEDEX ) IV infusion Stopped (02/27/24 1029)   feeding supplement (OSMOLITE 1.5 CAL) Stopped (02/26/24 2245)   PRN Meds: bisacodyl, docusate sodium , haloperidol  lactate, polyethylene glycol, sodium chloride  flush   Vital Signs  Vitals:   02/27/24 0900 02/27/24 0954 02/27/24 1000 02/27/24 1100  BP: (!) 101/40 117/61 (!) 96/57 (!) 111/49  Pulse: (!) 49 85 80 (!) 51  Resp: (!) 28 (!) 26 (!) 21 (!) 29  Temp: 97.8 F (36.6 C)     TempSrc: Axillary     SpO2: 96% 100% 100% 97%  Weight:      Height:        Intake/Output Summary (Last 24 hours) at 02/27/2024 1139 Last data filed at 02/27/2024 1001 Gross per 24 hour  Intake 731.51 ml  Output 990 ml  Net -258.49 ml      02/27/2024    5:00 AM 02/26/2024    5:00 AM 02/25/2024    4:00 AM  Last 3 Weights  Weight (lbs) 202 lb 13.2 oz 202 lb 9.6 oz 198 lb 10.2 oz  Weight (kg) 92 kg 91.9 kg 90.1 kg      Telemetry Normal sinus rhythm- Personally Reviewed  ECG   - Personally Reviewed  Physical Exam  GEN: Eyes closed, minimally arousable,  will move extremities to stimuli Neck: No JVD Cardiac: RRR, no murmurs, rubs, or gallops.  Respiratory: Clear to auscultation bilaterally. GI: Soft, nontender, non-distended  MS: No edema; No deformity. Neuro: Full exam not performed Psych: Encephalopathy  Labs High Sensitivity Troponin:   Recent Labs  Lab 02/20/24 1007 02/20/24 1223 02/20/24 2106 02/20/24 2330 02/21/24 0420  TROPONINIHS 528* 939* 2,286* 2,551* 2,304*     Chemistry Recent Labs  Lab 02/25/24 0304 02/25/24 0305 02/26/24 0401 02/27/24 0243  NA 143  --  141 142  K 4.0  --  4.6 4.7  CL 103  --  100 101  CO2 32  --  31 34*  GLUCOSE 117*  --  85 98  BUN 52*  --  43* 40*  CREATININE 0.89  --  0.97 0.77  CALCIUM  8.2*  --  8.2* 8.2*  MG 2.7*  --  2.2 2.2  PROT  --  5.3* 5.9* 6.0*  ALBUMIN  --  2.4* 2.5* 2.5*  AST  --  146* 116* 87*  ALT  --  564* 454* 333*  ALKPHOS  --  84 78 68  BILITOT  --  1.5* 1.5* 1.5*  GFRNONAA >60  --  >60 >60  ANIONGAP 8  --  10 7  Lipids  Recent Labs  Lab 02/25/24 0304  TRIG 53    Hematology Recent Labs  Lab 02/25/24 0304 02/26/24 0401 02/27/24 0243  WBC 12.3* 11.9* 12.8*  RBC 3.69* 3.70* 2.83*  HGB 11.4* 11.7* 9.0*  HCT 35.8* 36.4* 28.6*  MCV 97.0 98.4 101.1*  MCH 30.9 31.6 31.8  MCHC 31.8 32.1 31.5  RDW 15.0 15.2 14.9  PLT 246 250 248   Thyroid No results for input(s): TSH, FREET4 in the last 168 hours.  BNPNo results for input(s): BNP, PROBNP in the last 168 hours.  DDimer No results for input(s): DDIMER in the last 168 hours.   Radiology  DG Chest Port 1 View Result Date: 02/27/2024 CLINICAL DATA:  Acute respiratory failure, hypoxia EXAM: PORTABLE CHEST 1 VIEW COMPARISON:  02/23/2024 FINDINGS: Single frontal view of the chest excludes the left lung base by collimation. Enteric catheter passes below diaphragm tip excluded by collimation. Left internal jugular catheter tip overlies the superior vena cava. Cardiac silhouette is enlarged but stable.  Increasing veiling opacities at the lung bases, right greater than left, consistent with pleural effusions and/or basilar consolidation. No pneumothorax. IMPRESSION: 1. Progressive bibasilar consolidation and/or effusions, right greater than left. 2. Support devices as above. Electronically Signed   By: Ozell Daring M.D.   On: 02/27/2024 10:14   DG Abd 1 View Result Date: 02/27/2024 CLINICAL DATA:  Enteric catheter placement EXAM: ABDOMEN - 1 VIEW COMPARISON:  02/27/2024 FINDINGS: Supine frontal view of the abdomen excludes the lower pelvis and right hemidiaphragm by collimation. Enteric catheter tip and side port project over the gastric body. Stable nonspecific gaseous distention of the bowel. IMPRESSION: 1. Enteric catheter tip and side port projecting over the gastric body. Electronically Signed   By: Ozell Daring M.D.   On: 02/27/2024 10:13   DG Abd 1 View Result Date: 02/27/2024 CLINICAL DATA:  Feeding tube placement EXAM: ABDOMEN - 1 VIEW COMPARISON:  02/26/2024 FINDINGS: Small bore feeding catheter passes into the proximal stomach where it is doubled back on itself with the tip position near the region of the GE junction, similar to prior. IMPRESSION: Small bore feeding catheter is doubled back on itself in the proximal stomach with the tip near the region of the GE junction, similar to prior. Electronically Signed   By: Camellia Candle M.D.   On: 02/27/2024 07:28   DG Abd 1 View Result Date: 02/26/2024 CLINICAL DATA:  [id number] Nasogastric tube present [id number] EXAM: ABDOMEN - 1 VIEW COMPARISON:  X-ray abdomen 02/26/2024 5:22 p.m. FINDINGS: Cardiac paddles overlie the chest. Enteric tube coursing below the hemidiaphragm with tip overlying the expected region of the gastric lumen. Question kinking of the tube with persistent looping within the gastric. The bowel gas pattern is normal. No radio-opaque calculi or other significant radiographic abnormality are seen.At least small right pleural effusion.  Left costophrenic angle not visualized. IMPRESSION: 1. Enteric tube coursing below the hemidiaphragm with tip overlying the expected region of the gastric lumen. Question kinking of the tube with persistent looping within the gastric. Recommend further retraction by 7 cm. 2. At least small right pleural effusion. Electronically Signed   By: Morgane  Naveau M.D.   On: 02/26/2024 18:20   DG Abd 1 View Result Date: 02/26/2024 CLINICAL DATA:  Nasogastric tube adjustment. EXAM: ABDOMEN - 1 VIEW COMPARISON:  Earlier today FINDINGS: The weighted enteric tube remains looped in the stomach with the tip directed cranially. There is slight decreased distention of the loop. Retraction of approximately 10  cm should released loop. Stable bowel gas pattern. IMPRESSION: Weighted enteric tube remains looped in the stomach with the tip directed cranially. Retraction of approximately 10 cm should release the loop. Electronically Signed   By: Andrea Gasman M.D.   On: 02/26/2024 17:35   DG Abd 1 View Result Date: 02/26/2024 CLINICAL DATA:  OG tube placement. EXAM: ABDOMEN - 1 VIEW COMPARISON:  None Available. FINDINGS: The weighted enteric tube is looped in the distal stomach, the tip is in the region of the mid proximal stomach directed cranially. Repositioning is recommended. There multiple overlying monitoring devices. Slight gaseous bowel distention in the upper abdomen. IMPRESSION: Weighted enteric tube is looped in the distal stomach, the tip is in the region of the mid proximal stomach directed cranially. Repositioning is recommended. Electronically Signed   By: Andrea Gasman M.D.   On: 02/26/2024 17:18   DG Abd 1 View Result Date: 02/26/2024 CLINICAL DATA:  NG placement. EXAM: ABDOMEN - 1 VIEW COMPARISON:  Abdominal radiograph dated 02/26/2024. FINDINGS: Feeding tube with weighted tip in the epigastric area likely in the region of the GE junction or proximal stomach. Recommend further advancing by additional 5-7 cm.  IMPRESSION: Feeding tube with weighted tip in the epigastric area. Electronically Signed   By: Vanetta Chou M.D.   On: 02/26/2024 16:13   DG Abd 1 View Result Date: 02/26/2024 CLINICAL DATA:  OG tube placement. EXAM: ABDOMEN - 1 VIEW COMPARISON:  02/20/2024. FINDINGS: Enteric tube tip in the proximal gastric body. Air distended loops of bowel. Visualized osseous structures are unchanged. Defibrillator pads overlie the left chest wall. IMPRESSION: Enteric tube tip in the proximal gastric body. Electronically Signed   By: Harrietta Sherry M.D.   On: 02/26/2024 14:46    Cardiac Studies   Patient Profile    Patient Profile   73 y.o. male with a history of biventricular failure, EF 20-25%, schizoaffective disorder, cocaine use presenting with out of hospital cardiac arrest.    Assessment & Plan    Cardiac arrest Gurgling appreciated by cellmate, unclear downtime ROSC after epi, [facility]  [place] [facility], 15 minutes Agitation, following Versed, loss of pulse, ACLS reinitiated additional downtime 15 minutes -echocardiogram with severe biventricular failure EF 20%, dilated RV with severely depressed function - Slow LV flow particularly in the apical region, on Definity  images, concern for mural thrombus - Ejection fraction has been low dating back to 2019, no prior ischemic workup -Cardiac catheterization on hold in the setting of slow neurologic recovery - Consider palliative consultation for goals of care   Transaminitis AST 3300 ALT 2000 total bilirubin 5.5 on arrival concerning for shock liver -Numbers show signs of continued improvement -Total bilirubin 1.5 ALT 333 AST 87   Acute renal failure Creatinine 2.2 up from baseline 1.1 Hyponatremia, hyperkalemia arrival -Possible ATN - Has since made dramatic improvement creatinine 0.77   Respiratory distress Concern for aspiration on CT scan ICU team performed bronchoscopy, gastric content noted in the entire tracheobronchial tree  consistent with aspiration following cardiac arrest -Continues to have coarse breath sounds -High risk of aspiration, bronchitis and respiratory distress  Apical thrombus Unable to exclude mural thrombus in the apical region, slow swirling flow on Definity  images -Continue heparin  infusion   For questions or updates, please contact [facility] Please consult [email or web address] for contact info under     Signed, Kealy Lewter, MD  02/27/2024, 11:39 AM

## 2024-02-27 NOTE — IPAL (Signed)
  Interdisciplinary Goals of Care Family Meeting   Date carried out: 02/27/2024  Location of the meeting: Bedside  Member's involved: Nurse Practitioner, Bedside Registered Nurse, and Family Member or next of kin, Camelia Lewis NP with Palliative Care  Durable Power of Attorney or acting medical decision maker: Pt's 2 sisters Addie and Jarone Ostergaard at bedside, along with pt's brother Isiaih Hollenbach via telephone conference call.  Discussion: We discussed goals of care for [facility] .  We reviewed clinical course including  out-of-hospital Cardiac Arrest (15 minutes downtime) with Cardiogenic shock due to severe Biventricular CHF, sepsis due to H. Influenzae and Strep Pneumoniae Pneumonia, AKI, shock liver, and Acute Metabolic Encephalopathy.  Since self extubation, he has remained encephalopathic, with concern for his ability to manage secretions and protect his airway.  Today with increasing work of breathing, which is now causing him to become tachycardic, with low threshold to re-intubate.  Attempted to elicit goals of care. We discussed code status, Full Code versus Do Not Resuscitate, Encouraged patient to consider DNR/DNI status understanding evidenced based poor outcomes in similar hospitalized patients, as the cause of the arrest is likely associated with chronic/terminal disease rather than a reversible acute cardio-pulmonary event.  Discussed that given the severity of his biventricular HF, performing CPR would unlikely impact his long term prognosis in a meaningful manner.  They voice understanding of his current state and severity of his CHF.  At this time they would like continue current measures, re-intubate if necessary, and remain Full Code.  They do state that would NOT WANT TO PURSUE TRACHEOSTOMY if he were to be unable to liberate from vent.  They also state if he were severely declining and suffering, they would likely shift to comfort measures at that time.   Code  status:   Code Status: Full Code   Disposition: Continue current acute care  Time spent for the meeting: 25 minutes   Inge Lecher, AGACNP-BC [facility] Prefer epic messenger for cross cover needs If after hours, please call E-link  Inge [name] Lecher, NP  02/27/2024, 4:02 PM

## 2024-02-27 NOTE — Progress Notes (Signed)
 NG tube was placed this morning at 0930 AM. Nurse practitioner inge Lecher has looked at [facility] and had provided verbal order to use NG tube.  1100 AM: The patient was able to get his morning medications with RN. The patient has removed the NG tube while RN was in the room hanging IV medications. Nurse practitioner has been informed and has recommended to hold off on inserting new NG tube at this time and to continue to monitor the patient until this evening to see how he is doing and to see if the patient is more alert/awake.

## 2024-02-27 NOTE — Progress Notes (Signed)
 Blood glucose obtained at 0914 AM was incorrect and blood glucose was rechecked with a blood glucose result of 197 at 0916 AM.

## 2024-02-27 NOTE — Progress Notes (Addendum)
 NAME:  Miguel [name], MRN:  [id number], DOB:  [birth year], LOS: 7 ADMISSION DATE:  02/20/2024, CONSULTATION DATE:  02/20/2024 REFERRING MD:  Dr. Levander, CHIEF COMPLAINT:  Cardiac Arrest   Brief Pt Description / Synopsis:  73 y.o. male with PMHx of biventricular CHF and polysubstance abuse admitted following out-of-hospital Cardiac Arrest (15 minutes downtime) with Cardiogenic shock due to severe Biventricular CHF, sepsis due to H. Influenzae and Strep Pneumoniae Pneumonia, AKI, shock liver, and Acute Metabolic Encephalopathy.  History of Present Illness:  This is a 73 yo male who presented to [facility] ER via EMS from jail on 06/26 post cardiac arrest.  Per ER notes pts cellmate heard the pt gurgling this morning.  He was found pulseless with initial cardiac rhythm asystole.  EMS administered 3 doses of epi/2 mg of narcan .  Following administration of narcan  ROSC achieved.  Estimated downtime 15 minutes.  EMS reported en route to the ER pt became agitated and attempted to remove Igel.  EMS administered versed.  Pt became pulseless again en route ACLS protocol initiated.   ED Course  Upon arrival to the ER pt remained pulseless in PEA.  ACLS protocol continued pt received: 2 mg of epi/4 mg of iv narcan /1 amp of bicarb/1 g of calcium .  Igel exchanged for an ETT with ongoing CPR (pt did not require RSI medications for intubation).  ROSC achieved with estimated downtime during 2nd cardiac arrest 15 minutes.  CTA Chest negative for PE.  Significant lab results were: Na+ 129/K+ 5.3/chloride 90/glucose 110/BUN 49/creatinine 2.22/calcium  7.5/mag 3.2/alk phos 134/AST 3,301/ALT 2,038/total bilirubin 5.5/troponin 344/lactic acid 8.1/urine drug screen negative/UA concerning for possible UTI.  Sepsis protocol initiated pt to receive: cefepime /metronidazole /vancomycin .  Pt found to be severely hypoglycemic CBG 17 requiring 1 amp of D50W.  Levophed  gtt initiated due to hypotension.  PCCM team contacted for ICU admission.  Pt  remains in police custody with bilateral ankle cuffs in place, and police officer at bedside.   PCCM asked to admit for further workup and treatment.  Please see Significant Hospital Events section below for full detailed hospital course.   Pertinent  Medical History  Schizophrenia  Depression Current Smoker  Chronic Systolic CHF (Echo 09/11/22: EF <20%, grade II diastolic dysfunction, trivial pericardial effusion, mild mitral valve regurgitation, trivial tricuspid valve regurgitation, trivial pulmonic valve regurgitation) CVA with aphasia  Micro Data:  MRSA PCR 06/26>>negative  COVID/Influenza A&B/RSV 06/26>>negative  RVP 06/26>>negative  Blood 06/26>>No Growth Tracheal aspirate 06/26>>H influenza & strep pneumoniae  Urine 06/26>>less than 10,000 colonies/ml insignificant growth  Strep pneumoniae & Legionella pneum ur ag 06/26>>negative   Antimicrobials:   Anti-infectives (From admission, onward)    Start     Dose/Rate Route Frequency Ordered Stop   02/22/24 1100  cefTRIAXone (ROCEPHIN) 2 g in sodium chloride  0.9 % 100 mL IVPB        2 g 200 mL/hr over 30 Minutes Intravenous Every 24 hours 02/22/24 0952 02/27/24 2359   02/22/24 1100  azithromycin  (ZITHROMAX ) 500 mg in sodium chloride  0.9 % 250 mL IVPB  Status:  Discontinued        500 mg 250 mL/hr over 60 Minutes Intravenous Every 24 hours 02/22/24 0952 02/24/24 1009   02/21/24 0000  ceFEPIme  (MAXIPIME ) 2 g in sodium chloride  0.9 % 100 mL IVPB  Status:  Discontinued        2 g 200 mL/hr over 30 Minutes Intravenous Every 12 hours 02/20/24 1051 02/22/24 0952   02/20/24 1200  vancomycin  (VANCOREADY) IVPB  2000 mg/400 mL        2,000 mg 200 mL/hr over 120 Minutes Intravenous  Once 02/20/24 1007 02/20/24 1400   02/20/24 1050  vancomycin  variable dose per unstable renal function (pharmacist dosing)  Status:  Discontinued         Does not apply See admin instructions 02/20/24 1051 02/21/24 0925   02/20/24 0845  ceFEPIme   (MAXIPIME ) 2 g in sodium chloride  0.9 % 100 mL IVPB        2 g 200 mL/hr over 30 Minutes Intravenous  Once 02/20/24 0838 02/20/24 1410   02/20/24 0845  metroNIDAZOLE  (FLAGYL ) IVPB 500 mg        500 mg 100 mL/hr over 60 Minutes Intravenous  Once 02/20/24 0838 02/20/24 1559   02/20/24 0845  vancomycin  (VANCOCIN ) IVPB 1000 mg/200 mL premix  Status:  Discontinued        1,000 mg 200 mL/hr over 60 Minutes Intravenous  Once 02/20/24 0838 02/20/24 1007       Significant Hospital Events: Including procedures, antibiotic start and stop dates in addition to other pertinent events   06/26: Pt admitted post cardiac arrest mechanically intubated with possible cardiogenic/possible septic shock with multiorgan failure requiring levophed  and dobutamine  gtts 06/27: on minimal sedation, discontinued this AM. Off nor-epi, continued on dobutamine  06/28: decreased dobutamine  to 2.5, remains off sedation 06/29: arouses to tactile and verbal stimuli, remains disoriented. Failed SBT this AM. Dobutamine  increased to 5 mcg overnight due to bradycardia 06/30: Pt remains mechanically intubated, however neuro exam precludes extubation.  He has not received sedation since 06/27. Dobutamine  discontinued  06/30: MRI Brain Since the previous MRI, the patient has developed chronic encephalomalacia changes within the right medial occipital lobe compatible with an interval PCA distribution infarct. Interval development of encephalomalacia in area of previously demonstrated left MCA distribution infarct, which was acute on the previous MRI. 07/01: Overnight pt remained in atrial flutter with rvr hr 140 to 150's with possible ST elevation despite iv metoprolol .  Cardiologist Dr Darron notified recommended amiodarone bolus followed by amiodarone gtt for rate control.  However, this morning while on amiodarone gtt pt became severely bradycardic hr as low as 27 bpm with associated hypotension, amiodarone gtt discontinued he is now  requiring low dose levophed  gtt.  Self extubated.  Delirious requiring Precedex . 07/02: No significant events noted overnight.  Remains on Precedex  due to delirium, currently protecting his airway.  Afebrile, hemodynamically stable, Levophed  weaned off, Bradycardia resolved, no reports of tachyarrhythmias.  Dobutamine  weaned to 2.5 and Coox remains stable at 83, will d/c Dobutamine .  Speech/PT/OT consulted.  Failed speech evaluation, plan to place Dobhoff as able, remove foley. 07/03: No significant events noted overnight. Remains on low dose precedex  (0.4 mcg) due to delirium.  Afebrile, hemodynamically stable, no vasopressors.  Coox 73 and Lactic 1.1 OFF of inotropes and OFF Levophed .  Dobhoff with difficultly functioning, will exchange it to a regular NGT for feeds and med administration.  Gentle diuresis with 20 mg IV Lasix  x1 dose.  Hgb decreased to 9 from 11.7, no signs of bleeding, will d/c Heparin  gtt given no thrombus on repeat Echo.  Interim History / Subjective:  As outlined above under Significant Hospital Events section  Objective   Blood pressure 120/63, pulse 78, temperature (!) 97.3 F (36.3 C), resp. rate (!) 26, height 6' (1.829 m), weight 92 kg, SpO2 100%. CVP:  [12 mmHg-15 mmHg] 12 mmHg      Intake/Output Summary (Last 24 hours) at 02/27/2024 0747  Last data filed at 02/27/2024 0400 Gross per 24 hour  Intake 703 ml  Output 915 ml  Net -212 ml   Filed Weights   02/25/24 0400 02/26/24 0500 02/27/24 0500  Weight: 90.1 kg 91.9 kg 92 kg    Examination: General: Acutely ill appearing male, laying in bed, on 4L [place], lightly sedated on Precedex , in NAD HENT: Atraumatic, normocephalic, neck supple, no JVD Lungs: Coarse breath sounds throughout, even, nonlabored, normal effort Cardiovascular: RRR, no M/R/G Abdomen: Soft, nontender, nondistended, no guarding or rebound tenderness, BS+ x4 Extremities: Normal bulk and tone, no deformities, no cyanosis, 1+ edema bilateral LE Neuro:  Lightly sedated on 0.4 mcg Precedex , arouses easily to voice, moves all extremities to commands with no focal deficits noted, does not answer orientation questions, pupils PERRL GU: External male catheter in place draining yellow urine  Resolved Hospital Problem list   Hyponatremia  Lactic acidosis  Hyperkalemia   Assessment & Plan:   #Cardiac arrest (PEA)  #Cardiogenic/possible septic shock~resolved   #Acute on chronic Biventricular CHF #Elevated troponin suspect secondary to demand ischemia vs NSTEMI #Possible mural thrombus  Hx: CVA with aphasia  CTA Chest PE 02/20/24: No evidence of acute pulmonary embolism. Rounded filling defect within the right atrial appendage, suspicious for thrombus. Echo 02/20/24: EF 20 to 25%, indeterminate diastolic parameters, possible mural thrombus in apical region, RV systolic function is severely reduced, RV size moderately enlarged -Continuous cardiac monitoring -Maintain MAP >65 -Vasopressors as needed to maintain MAP goal ~ weaned off -Follow Coox and CVP -Dobutamine  d/c on 7/2 -HS Troponin peaked at 2,551 -Repeat Echocardiogram 06/30: no definitive LV thrombus -Diuresis as BP and renal function permits ~ will give 20 mg IV Lasix  x1 dose on 7/3 -D/c Heparin  gtt on 7/3 -Cardiology following, appreciate input ~ considering cardiac cath at some point ~ Per Cardiology pt is NOT a good candidate for advanced therapies such as ICD and LVAD due to polysubstance abuse hx and medication noncompliance  #Acute respiratory failure  #Bilateral pleural effusions right greater than left  #H influenza and strep pneumoniae Pneumonia Hx: Tobacco abuse  SELF-EXTUBATED 02/25/24 ~ currently protecting his airway but remains high risk for reintubation -Supplemental O2 as needed to maintain O2 sats >92% -Follow intermittent Chest X-ray & ABG as needed -Bronchodilators PRN  -ABX as above -Diuresis as BP and renal function permits ~ will give 20 mg IV Lasix  x1 dose on  7/3 -Pulmonary toilet as able  #Sepsis #H. Influenza & Strep Pneumoniae Pneumonia -Monitor fever curve -Trend WBC's -Follow cultures as above -Continue empiric Ceftriaxone pending cultures & sensitivities (plan for 5 day course)  #Acute Kidney Injury secondary to ATN ~ RESOLVED -Monitor I&O's / urinary output -Follow BMP -Ensure adequate renal perfusion -Avoid nephrotoxic agents as able -Replace electrolytes as indicated ~ Pharmacy following for assistance with electrolyte replacement  #Shock liver ~ IMPROVING #Elevated alk phos and total bilirubin  CT Abd/Pelvis 06/26: Mild diffuse mural thickening, which may be seen in the setting of hypoperfusion. Small volume free fluid. HCV ab 06/26: reactive  - Trend hepatic function panel & coags - Avoid hepatotoxic agents as able   #Anemia without signs of bleeding  -Trend CBC  -Monitor for s/sx of bleeding  -Lovenox  for DVT ppx (heparin  gtt d/c on 7/3 as no evidence of thrombus on repeat Limited Echo) -Transfuse fo hgb <7   #Hypoglycemia  -CBG's q4hrs for now  -Follow hypo/hyperglycemic protocol -Target CBG range 140 to 180  #Acute metabolic encephalopathy  Hx: Polysubstance  abuse, depression, and schizoaffective disorder EEG 02/21/24: Study is suggestive of severe to profound diffuse encephalopathy. No seizures or epileptiform discharges were seen throughout the recording. MRI Brain 02/24/24: Since the previous MRI, the patient has developed chronic encephalomalacia changes within the right medial occipital lobe compatible with an interval PCA distribution infarct. Interval development of encephalomalacia in area of previously demonstrated left MCA distribution infarct, which was acute on the previous MRI. -UDS was negative -Treatment of metabolic derangements as outlined above -Provide supportive care -Promote normal sleep/wake cycle and family presence -Avoid sedating medications as able -Continue Precedex  as needed, wean as  able -Once NGT placed, will start scheduled Seroquel in attempts to wean precedex  (on Aristada outpatient)   Pt is critically ill with multiorgan failure. Prognosis is guarded, high risk for further decompensation, cardiac arrest, and death.  Given current critical illness superimposed on multiple chronic co-morbidities and advanced age, overall long term prognosis is poor.  Recommend consideration of DNR/DNI status.  Palliative Care following to assist with GOC discussions.   Best Practice (right click and Reselect all SmartList Selections daily)   Diet/type: NPO, failed speech eval, will attempt to place NGTand start tube feeds DVT prophylaxis: Lovenox  GI prophylaxis: PPI Lines: Central line and yes and it is still needed Foley:  N/A Code Status:  full code Last date of multidisciplinary goals of care discussion [7/3]  7/3: Will update pt's sister when she arrives at bedside on plan of care.  Labs   CBC: Recent Labs  Lab 02/20/24 0751 02/20/24 1223 02/20/24 2256 02/21/24 0420 02/23/24 0422 02/24/24 0354 02/25/24 0304 02/26/24 0401 02/27/24 0243  WBC 14.7* 18.0* 17.7*   < > 23.6* 20.9* 12.3* 11.9* 12.8*  NEUTROABS 12.7* 16.5* 15.9*  --   --   --   --   --   --   HGB 11.9* 12.8* 12.1*   < > 12.0* 11.9* 11.4* 11.7* 9.0*  HCT 37.3* 39.7 36.2*   < > 35.7* 35.8* 35.8* 36.4* 28.6*  MCV 102.5* 97.5 95.3   < > 93.7 95.5 97.0 98.4 101.1*  PLT 355 351 311   < > 253 238 246 250 248   < > = values in this interval not displayed.    Basic Metabolic Panel: Recent Labs  Lab 02/23/24 0422 02/23/24 1518 02/24/24 0354 02/24/24 1146 02/25/24 0304 02/26/24 0401 02/27/24 0243  NA 137 138 141  --  143 141 142  K 4.1 4.1 4.3  --  4.0 4.6 4.7  CL 97* 100 98  --  103 100 101  CO2 32 30 33*  --  32 31 34*  GLUCOSE 106* 111* 117*  --  117* 85 98  BUN 59* 53* 52*  --  52* 43* 40*  CREATININE 1.21 1.11 1.04  --  0.89 0.97 0.77  CALCIUM  7.6* 7.4* 7.9*  --  8.2* 8.2* 8.2*  MG 2.8* 2.4   --  2.4 2.7* 2.2 2.2  PHOS 2.6  --  2.8  --  2.3* 4.3 3.9   GFR: Estimated Creatinine Clearance: 93 mL/min (by C-G formula based on SCr of 0.77 mg/dL). Recent Labs  Lab 02/20/24 2256 02/21/24 0420 02/22/24 0444 02/22/24 0802 02/23/24 0422 02/24/24 0354 02/25/24 0304 02/26/24 0401 02/27/24 0243  WBC 17.7* 19.3*   < >  --    < > 20.9* 12.3* 11.9* 12.8*  LATICACIDVEN 4.0* 2.1*  --  1.3  --   --   --   --  1.1   < > =  values in this interval not displayed.    Liver Function Tests: Recent Labs  Lab 02/20/24 0751 02/21/24 0420 02/22/24 0444 02/23/24 0422 02/24/24 0354 02/25/24 0305 02/26/24 0401 02/27/24 0243  AST 3,301* 2,959*  --   --   --  146* 116* 87*  ALT 2,038* 2,111*  --   --   --  564* 454* 333*  ALKPHOS 134* 119  --   --   --  84 78 68  BILITOT 5.5* 4.9*  --   --   --  1.5* 1.5* 1.5*  PROT 5.7* 5.2*  --   --   --  5.3* 5.9* 6.0*  ALBUMIN 2.7* 2.6*   < > 2.5* 2.6* 2.4* 2.5* 2.5*   < > = values in this interval not displayed.   No results for input(s): LIPASE, AMYLASE in the last 168 hours. Recent Labs  Lab 02/25/24 1046  AMMONIA 31    ABG    Component Value Date/Time   PHART 7.35 02/20/2024 1452   PCO2ART 45 02/20/2024 1452   PO2ART 59 (L) 02/20/2024 1452   HCO3 24.8 02/20/2024 1452   ACIDBASEDEF 1.1 02/20/2024 1452   O2SAT 73.8 02/27/2024 0521     Coagulation Profile: Recent Labs  Lab 02/20/24 1344 02/20/24 2256 02/21/24 0420 02/26/24 0401  INR 3.4* 2.5* 2.8* 1.2    Cardiac Enzymes: No results for input(s): CKTOTAL, CKMB, CKMBINDEX, TROPONINI in the last 168 hours.  HbA1C: Hgb A1c MFr Bld  Date/Time Value Ref Range Status  07/14/2018 04:29 AM 5.8 (H) 4.8 - 5.6 % Final    Comment:    (NOTE) Pre diabetes:          5.7%-6.4% Diabetes:              >6.4% Glycemic control for   <7.0% adults with diabetes   07/13/2018 04:25 AM 6.2 (H) 4.8 - 5.6 % Final    Comment:    (NOTE) Pre diabetes:          5.7%-6.4% Diabetes:               >6.4% Glycemic control for   <7.0% adults with diabetes     CBG: Recent Labs  Lab 02/26/24 1627 02/26/24 2007 02/26/24 2042 02/27/24 0030 02/27/24 0340  GLUCAP 83 62* 122* 91 92    Review of Systems:   Unable to assess due to AMS  Past Medical History:  He,  has a past medical history of Biventricular failure (HCC), CVA (cerebral vascular accident) (HCC), Polysubstance use disorder, and Schizophrenia (HCC).   Surgical History:   Past Surgical History:  Procedure Laterality Date   APPENDECTOMY       Social History:   reports that he has never smoked. He has never used smokeless tobacco. He reports that he does not drink alcohol and does not use drugs.   Family History:  His Family history is unknown by patient.   Allergies No Known Allergies   Home Medications  Prior to Admission medications   Medication Sig Start Date End Date Taking? Authorizing Provider  ARISTADA 882 MG/3.2ML prefilled syringe Inject 882 mg into the muscle every 30 (thirty) days. 08/23/22  Yes [provider]  atorvastatin  (LIPITOR) 40 MG tablet Take 1 tablet (40 mg total) by mouth daily at 6 PM. 07/15/18  Yes Laurence Bridegroom, MD  furosemide  (LASIX ) 40 MG tablet Take 1 tablet (40 mg total) by mouth daily. 06/19/22 02/20/24 Yes Arlander Charleston, MD  losartan (COZAAR) 25 MG tablet  Take 25 mg by mouth daily. 02/10/24  Yes [provider]     Critical care time: 40 minutes     Inge Lecher, AGACNP-BC [facility] Prefer epic messenger for cross cover needs If after hours, please call E-link

## 2024-02-27 NOTE — Consult Note (Addendum)
 Daily Progress Note   Patient Name: Miguel [name]       Date: 02/27/2024 DOB: Dec 16, 1950  Age: 73 y.o. MRN#: [id number] Attending Physician: Malka Domino, MD Primary Care Physician: [facility] Admit Date: 02/20/2024  Reason for Consultation/Follow-up: Establishing goals of care  Subjective: Notes and labs reviewed. Spoke with TOC to further clarify most recent TOC note.  In to see patient. He is resting in bed at this time, no family at bedside. He has on gloves but reaches to hold my hand when speaking to him. He appears restless, but also appears to have WOB and tries to reposition in bed. Standing at bedside audible congestion noted; he is unable to mobilize secretions.    Spoke with CCM for updates. Attempted to call his sister Addie unsuccessfully.  Was able to reach Darin Hila.  Discussed patient's status and updates.  She states patient is divorced.  She states he does not have children.  She discusses a time in his youth when his father died that he attempted to take his own life.  She states things were never the same for him after his father died.  She states patient is homeless.  She states he has tried to live with his brother in the past but begins to use drugs and so he has to leave the home.  He states he has tried to live alone in the past, but does not want to be by himself and so he brings people in to live with him and then he is evicted from where he is living.  She discusses several attempts to try to get off drugs with long periods of abstinence prior to using again.  While speaking with her, was alerted that one of the sisters were coming to the bedside to discuss goals of care as patient may need to return to ventilator support.  Hila states  she will come to bedside as well.  Met with Alicia and Addie as well as CCM at bedside.  Discussed status and goals of care.  Hila called and added their brother Cherene to the phone call.  Cherene is a Programmer, multimedia, and they discussed they are all a family of faith.  Family understands the overall poor prognosis.  They discussed that at this time they would want any and  all care including CPR as patient received CPR twice leading to this admission and is alive because of this.  They would like him placed back on ventilator support if needed due to respiratory status.  They would not want tracheostomy or long-term ventilator support.  They would like to provide a bit more time for outcomes.   The siblings confirm there is no H POA and they are making decisions together.  Length of Stay: 7  Current Medications: Scheduled Meds:   Chlorhexidine  Gluconate Cloth  6 each Topical Daily   enoxaparin  (LOVENOX ) injection  40 mg Subcutaneous Q24H   feeding supplement (PROSource TF20)  60 mL Per Tube Daily   free water   30 mL Per Tube Q4H   ipratropium-albuterol   3 mL Nebulization Q4H   multivitamin with minerals  1 tablet Per Tube Daily   pantoprazole  (PROTONIX ) IV  40 mg Intravenous QHS   QUEtiapine   25 mg Per Tube BID   sodium chloride  flush  10-40 mL Intracatheter Q12H   sodium chloride  HYPERTONIC  4 mL Nebulization BID   thiamine  (VITAMIN B1) injection  100 mg Intravenous Daily    Continuous Infusions:  dexmedetomidine  (PRECEDEX ) IV infusion Stopped (02/27/24 1029)   feeding supplement (OSMOLITE 1.5 CAL) Stopped (02/26/24 2245)    PRN Meds: bisacodyl , docusate sodium , haloperidol  lactate, polyethylene glycol, sodium chloride  flush  Physical Exam Constitutional:      Comments: Restless.  Pulmonary:     Comments: Congested breathing noted. Skin:    General: Skin is warm and dry.  Neurological:     Mental Status: He is alert.             Vital Signs: BP (!) 108/54 (BP Location: Left Arm)    Pulse 85   Temp (!) 97.5 F (36.4 C) (Axillary)   Resp (!) 23   Ht 6' (1.829 m)   Wt 92 kg   SpO2 95%   BMI 27.51 kg/m  SpO2: SpO2: 95 % O2 Device: O2 Device: Nasal Cannula O2 Flow Rate: O2 Flow Rate (L/min): 4 L/min  Intake/output summary:  Intake/Output Summary (Last 24 hours) at 02/27/2024 1314 Last data filed at 02/27/2024 1225 Gross per 24 hour  Intake 841.42 ml  Output 1065 ml  Net -223.58 ml   LBM: Last BM Date : 03/27/24 Baseline Weight: Weight: 103.4 kg Most recent weight: Weight: 92 kg  Patient Active Problem List   Diagnosis Date Noted   Cardiac arrest (HCC) 02/20/2024   Malnutrition of moderate degree 02/20/2024   Aspiration pneumonia of both lungs due to gastric secretions (HCC) 02/20/2024   HFrEF (heart failure with reduced ejection fraction) (HCC) 02/20/2024   LV (left ventricular) mural thrombus 02/20/2024   Elevated troponin 02/20/2024   Transaminitis 02/20/2024   Dilated cardiomyopathy (HCC) 02/20/2024   Schizoaffective disorder, bipolar type (HCC) 09/14/2022   Hypertensive emergency 09/13/2022   Bilateral pleural effusion 09/13/2022   Cocaine use disorder (HCC) 09/13/2022   Acute on chronic systolic CHF (congestive heart failure) (HCC) 09/10/2022   Acute respiratory failure with hypoxia (HCC) 09/10/2022   Stroke (HCC) 07/12/2018   Noncompliance 04/11/2016    Palliative Care Assessment & Plan    Recommendations/Plan: Full code and full scope at this time. Time for outcomes.  Code Status:    Code Status Orders  (From admission, onward)           Start     Ordered   02/20/24 0934  Full code  Continuous  Question:  By:  Answer:  Default: patient does not have capacity for decision making, no surrogate or prior directive available   02/20/24 0935           Code Status History     Date Active Date Inactive Code Status Order ID Comments User Context   09/10/2022 1100 09/15/2022 2253 Full Code [id number]  Maranda Lonell MATSU, NP ED    07/12/2018 2318 07/15/2018 1504 Full Code [id number]  Jenel Lenis, MD ED       Thank you for allowing the Palliative Medicine Team to assist in the care of this patient.    Camelia Lewis, NP  Please contact Palliative Medicine Team phone at [phone] for questions and concerns.

## 2024-02-27 NOTE — Progress Notes (Signed)
 PHARMACY - ANTICOAGULATION CONSULT NOTE  Pharmacy Consult for Heparin  Infusion Indication: possible LV thrombus  No Known Allergies  Patient Measurements: Height: 6' (182.9 cm) Weight: 92 kg (202 lb 13.2 oz) IBW/kg (Calculated) : 77.6  Vital Signs: Temp: 97.3 F (36.3 C) (07/03 0400) Temp Source: Axillary (07/03 0015) BP: 133/66 (07/03 0400) Pulse Rate: 78 (07/03 0410)  Labs: Recent Labs    02/25/24 0304 02/26/24 0401 02/26/24 1333 02/26/24 2144 02/27/24 0243 02/27/24 0520  HGB 11.4* 11.7*  --   --  9.0*  --   HCT 35.8* 36.4*  --   --  28.6*  --   PLT 246 250  --   --  248  --   LABPROT  --  15.9*  --   --   --   --   INR  --  1.2  --   --   --   --   HEPARINUNFRC 0.66 0.84* 0.84* 0.45  --  0.54  CREATININE 0.89 0.97  --   --  0.77  --     Estimated Creatinine Clearance: 93 mL/min (by C-G formula based on SCr of 0.77 mg/dL).   Medical History: Past Medical History:  Diagnosis Date   Biventricular failure (HCC)    CVA (cerebral vascular accident) (HCC)    Polysubstance use disorder    Schizophrenia (HCC)    Assessment: Patient is 73yo male with a possible LV thrombus. Pharmacy consulted for Heparin  dosing. Patient was not initially started on Heparin  due to concern for DIC, but now improved. Patient did receive Enoxaparin  40mg  ~12:00.  Goal of Therapy:  Heparin  level 0.3-0.7 units/ml Monitor platelets by anticoagulation protocol: Yes  06/30 0017 HL 0.62, therapeutic x 1 06/30 0821 HL 0.73, SUPRAtherapeutic 06/30 1725 HL 0.69, therapeutic x1 07/01 0304 HL 0.66, therapeutic X 2  07/02 0401 HL 0.84, elevated  07/02 2144 HL 0.45, therapeutic X 1  07/03 0520 HL 0.54, therapeutic X 2    Plan:  07/03:  HL @ 0520 = 0.54, therapeutic X 2  - Will continue pt on current rate and recheck HL on 7/4 with AM labs  Monitor heparin  levels daily while on heparin  infusion CBC daily while on heparin   Thank you for involving pharmacy in this patient's care.    Sebastien Jackson D Clinical Pharmacist 02/27/2024 6:30 AM

## 2024-02-27 NOTE — Progress Notes (Signed)
 Patient with congested bilateral breath sounds throughout lung fields. Not able to mobilize secretion due to sedation. At risk for intubation. Remains tachycardic.  Decision made to attempt to nts patient despite risk for nasal trauma due to multiple nasal gastric challenges with this patient. NP and RN assisted with efforts. Able to nasally suction patient with minimal trauma to right nare. Copious amount of thick secretions passed via catheter. Patient tolerated interventions well. All safety precautions followed. Oxygen therapy in use pre-post procedure.

## 2024-02-27 NOTE — Evaluation (Signed)
 Physical Therapy Evaluation Patient Details Name: Miguel [name] MRN: [id number] DOB: [birth year] Today's Date: 02/27/2024  History of Present Illness  Patient is a 73 year old male with out of hospital cardiac arrest with cardiogenic shock due to severe Biventricular CHF, sepsis due to H. Influenzae and Strep Pneumoniae Pneumonia, AKI, shock liver, and Acute Metabolic Encephalopathy. PMH: polysubstance abuse, schizoaffective disorder, biventricular failure, EF 20 to 25%.  Clinical Impression  Patient with decreased level of arousal. He does open eyes for brief periods and appears restless and mild agitated with stimulation. +2 person assistance required for bed mobility with poor sitting balance. Limited sitting tolerance and patient returning himself to bed after sitting up briefly. Anticipate patient will need continued PT to maximize independence and facilitate return to prior level of function.       If plan is discharge home, recommend the following: Two people to help with walking and/or transfers;Two people to help with bathing/dressing/bathroom;Supervision due to cognitive status;Assistance with cooking/housework;Assist for transportation;Help with stairs or ramp for entrance   Can travel by private vehicle   No    Equipment Recommendations  (to be determined)  Recommendations for Other Services       Functional Status Assessment Patient has had a recent decline in their functional status and demonstrates the ability to make significant improvements in function in a reasonable and predictable amount of time.     Precautions / Restrictions Precautions Precautions: Fall Recall of Precautions/Restrictions: Impaired Restrictions Weight Bearing Restrictions Per Provider Order: No      Mobility  Bed Mobility Overal bed mobility: Needs Assistance Bed Mobility: Supine to Sit, Sit to Supine     Supine to sit: Max assist, +2 for physical assistance Sit to supine: Max assist, +2  for physical assistance   General bed mobility comments: +2 person assistance required. cues for initiation and sequencing. assisted patient to sitting in attempts to facilitate arousal. patient is able to maintain eye opening for brief periods of 5 seconds but keeps eyes closed otherwise. patient appears mildly agitated with getting back to bed, attempting to push therapist away    Transfers                   General transfer comment: not attempted due to poor sitting tolerance, minimal participation with mobility efforts    Ambulation/Gait                  Stairs            Wheelchair Mobility     Tilt Bed    Modified Rankin (Stroke Patients Only)       Balance Overall balance assessment: Needs assistance Sitting-balance support: Feet supported Sitting balance-Leahy Scale: Poor Sitting balance - Comments: patient needs close CGA/stand by assistance for safety. up to minimal assistance to maintain sitting balance, however patient attempts to return himself to be by leaning to the left                                     Pertinent Vitals/Pain Pain Assessment Pain Assessment: CPOT Facial Expression: Relaxed, neutral Body Movements: Protection Muscle Tension: Relaxed Compliance with ventilator (intubated pts.): N/A Vocalization (extubated pts.): Talking in normal tone or no sound CPOT Total: 1 Pain Intervention(s): Repositioned, Monitored during session, Limited activity within patient's tolerance    Home Living Family/patient expects to be discharged to:: Unsure  Additional Comments: [facility]    Prior Function Prior Level of Function : Patient poor historian/Family not available             Mobility Comments: patient was independent with mobility at baseline per prior hospital stay information. unable to state today       Extremity/Trunk Assessment   Upper  Extremity Assessment Upper Extremity Assessment: Difficult to assess due to impaired cognition (hand mitts placed; unable to follow commands)    Lower Extremity Assessment Lower Extremity Assessment: Defer to PT evaluation       Communication   Communication Communication: Impaired Factors Affecting Communication: Difficulty expressing self    Cognition Arousal: Lethargic, Suspect due to medications Behavior During Therapy: Restless (very lethargic but with stimulation appears restless, intermittently agitated)   PT - Cognitive impairments: Difficult to assess Difficult to assess due to: Level of arousal                       Following commands: Impaired Following commands impaired:  (unable to follow commands at this time)     Cueing Cueing Techniques: Verbal cues, Gestural cues, Tactile cues     General Comments General comments (skin integrity, edema, etc.): heart rate around 50bpm, Sp02 in the 90's on 3 L02. breathing sounds congested. bilateral mitts in place throughout session.    Exercises     Assessment/Plan    PT Assessment Patient needs continued PT services  PT Problem List Decreased strength;Decreased range of motion;Decreased activity tolerance;Decreased balance;Decreased coordination;Decreased cognition;Decreased knowledge of use of DME;Decreased safety awareness       PT Treatment Interventions DME instruction;Gait training;Functional mobility training;Therapeutic activities;Therapeutic exercise;Neuromuscular re-education;Balance training;Patient/family education;Cognitive remediation;Wheelchair mobility training    PT Goals (Current goals can be found in the Care Plan section)  Acute Rehab PT Goals Patient Stated Goal: patient unable to participate with goal setting PT Goal Formulation: Patient unable to participate in goal setting Time For Goal Achievement: 03/12/24 Potential to Achieve Goals: Fair    Frequency Min 2X/week      Co-evaluation PT/OT/SLP Co-Evaluation/Treatment: Yes Reason for Co-Treatment: Complexity of the patient's impairments (multi-system involvement);For patient/therapist safety;To address functional/ADL transfers PT goals addressed during session: Mobility/safety with mobility OT goals addressed during session: ADL's and self-care       AM-PAC PT 6 Clicks Mobility  Outcome Measure Help needed turning from your back to your side while in a flat bed without using bedrails?: Total Help needed moving from lying on your back to sitting on the side of a flat bed without using bedrails?: Total Help needed moving to and from a bed to a chair (including a wheelchair)?: Total Help needed standing up from a chair using your arms (e.g., wheelchair or bedside chair)?: Total Help needed to walk in hospital room?: Total Help needed climbing 3-5 steps with a railing? : Total 6 Click Score: 6    End of Session   Activity Tolerance: Patient limited by lethargy Patient left: in bed;with call bell/phone within reach;with bed alarm set;with SCD's reapplied Nurse Communication: Mobility status PT Visit Diagnosis: Muscle weakness (generalized) (M62.81);Unsteadiness on feet (R26.81)    Time: 8884-8873 PT Time Calculation (min) (ACUTE ONLY): 11 min   Charges:   PT Evaluation $PT Eval High Complexity: 1 High   PT General Charges $$ ACUTE PT VISIT: 1 Visit        [name], PT, MPT  [name] 02/27/2024, 12:36 PM

## 2024-02-27 NOTE — Progress Notes (Signed)
 Per Nurse practitioner Inge Lecher RN to continue to monitor the patient at this time. The patient's heart rate continues to be in the 130's. Amiodarone  drip is currently infusing. 500 mL saline bolus is to be infused over 5 hrs.  Per NP Precedex  drip is to remain off but available if required. The patient continues to be drowsy. Tube feeds have been held.

## 2024-02-27 NOTE — Progress Notes (Signed)
 PHARMACY CONSULT NOTE - FOLLOW UP  Pharmacy Consult for Electrolyte Monitoring and Replacement   Recent Labs: Potassium (mmol/L)  Date Value  02/27/2024 4.7  09/15/2012 4.4   Magnesium  (mg/dL)  Date Value  92/96/7974 2.2   Calcium  (mg/dL)  Date Value  92/96/7974 8.2 (L)   Calcium , Total (mg/dL)  Date Value  98/79/7985 9.4   Albumin (g/dL)  Date Value  92/96/7974 2.5 (L)  09/15/2012 4.2   Phosphorus (mg/dL)  Date Value  92/96/7974 3.9   Sodium (mmol/L)  Date Value  02/27/2024 142  09/15/2012 146 (H)     Assessment: 73 y.o. male w/ PMH of CHF, HTN, CVA, schizophrenia admitted on 02/20/2024 with cardiac arrest. Pharmacy is asked to follow and replace electrolytes while in CCU   Free water  30 ml q4H.   Goal of Therapy:  Electrolytes WNL  Plan:  No replacement indicated F/u with AM labs  Miguel [name] ,PharmD Clinical Pharmacist 02/27/2024 7:21 AM

## 2024-02-27 NOTE — Progress Notes (Addendum)
 The patient's sounds more congested with rhonchi. Nurse practitioner inge Lecher has seen the patient at the bedside and has ordered a second dose of 20 mg of IV lasix . Current heart rate is in the 130's to 140's. No further interventions have been ordered at this time.

## 2024-02-27 NOTE — Progress Notes (Signed)
 SLP Cancellation Note  Patient Details Name: Miguel [name] MRN: [id number] DOB: 1951/02/16   Cancelled treatment:       Reason Eval/Treat Not Completed: Medical issues which prohibited therapy;Patient not medically ready;Patient's level of consciousness (chart reviewed; consulted NSG and RT. Secure chat w/ MD.)  Pt was in bed, eyes opened and agitated-appearing UE movements. Bilat Mitts+. Noted MOD-sounding congested breathing at rest and w/ movements. NGT present(initial dobhoff unsuccessful per notes).  When asked his name, pt did not respond though mouth was open. He only turned head side to side. When asked if he could count to 3 post a model, he did not. SLP asked if pt was hungry or thirsty, he turned his head and seemed to phonate no. He continued w/ frequent waving of the UEs.   Pt does not present w/ Readiness for oral intake d/t POOR State alertness/awareness. When pt is calmer and able to attend to tasks better, risk for aspiration is expected to be less. Will also continue to monitor pt's secretions(lessening of) b/f offering po's. Recommend frequent oral care for hygiene and stimulation of swallowing. MD and NSG updated.     Comer Portugal, MS, CCC-SLP Speech Language Pathologist Rehab Services; [facility] [phone] (ascom) Deniz Eskridge 02/27/2024, 10:40 AM

## 2024-02-28 ENCOUNTER — Inpatient Hospital Stay

## 2024-02-28 DIAGNOSIS — J9690 Respiratory failure, unspecified, unspecified whether with hypoxia or hypercapnia: Secondary | ICD-10-CM | POA: Diagnosis not present

## 2024-02-28 DIAGNOSIS — Z4682 Encounter for fitting and adjustment of non-vascular catheter: Secondary | ICD-10-CM | POA: Diagnosis not present

## 2024-02-28 DIAGNOSIS — J90 Pleural effusion, not elsewhere classified: Secondary | ICD-10-CM | POA: Diagnosis not present

## 2024-02-28 DIAGNOSIS — R140 Abdominal distension (gaseous): Secondary | ICD-10-CM | POA: Diagnosis not present

## 2024-02-28 DIAGNOSIS — G9341 Metabolic encephalopathy: Secondary | ICD-10-CM | POA: Diagnosis not present

## 2024-02-28 DIAGNOSIS — B963 Hemophilus influenzae [H. influenzae] as the cause of diseases classified elsewhere: Secondary | ICD-10-CM | POA: Diagnosis not present

## 2024-02-28 DIAGNOSIS — I469 Cardiac arrest, cause unspecified: Secondary | ICD-10-CM | POA: Diagnosis not present

## 2024-02-28 DIAGNOSIS — K6389 Other specified diseases of intestine: Secondary | ICD-10-CM | POA: Diagnosis not present

## 2024-02-28 DIAGNOSIS — I5023 Acute on chronic systolic (congestive) heart failure: Secondary | ICD-10-CM | POA: Diagnosis not present

## 2024-02-28 DIAGNOSIS — Z7189 Other specified counseling: Secondary | ICD-10-CM | POA: Diagnosis not present

## 2024-02-28 LAB — CBC
HCT: 19.9 % — ABNORMAL LOW (ref 39.0–52.0)
Hemoglobin: 6.4 g/dL — ABNORMAL LOW (ref 13.0–17.0)
MCH: 32.2 pg (ref 26.0–34.0)
MCHC: 32.2 g/dL (ref 30.0–36.0)
MCV: 100 fL (ref 80.0–100.0)
Platelets: 279 K/uL (ref 150–400)
RBC: 1.99 MIL/uL — ABNORMAL LOW (ref 4.22–5.81)
RDW: 15.2 % (ref 11.5–15.5)
WBC: 15.5 K/uL — ABNORMAL HIGH (ref 4.0–10.5)
nRBC: 0.5 % — ABNORMAL HIGH (ref 0.0–0.2)

## 2024-02-28 LAB — HEMOGLOBIN AND HEMATOCRIT, BLOOD
HCT: 22.6 % — ABNORMAL LOW (ref 39.0–52.0)
HCT: 22.7 % — ABNORMAL LOW (ref 39.0–52.0)
Hemoglobin: 7.4 g/dL — ABNORMAL LOW (ref 13.0–17.0)
Hemoglobin: 7.5 g/dL — ABNORMAL LOW (ref 13.0–17.0)

## 2024-02-28 LAB — LACTIC ACID, PLASMA
Lactic Acid, Venous: 1.3 mmol/L (ref 0.5–1.9)
Lactic Acid, Venous: 1.2 mmol/L (ref 0.5–1.9)

## 2024-02-28 LAB — COMPREHENSIVE METABOLIC PANEL WITH GFR
ALT: 251 U/L — ABNORMAL HIGH (ref 0–44)
AST: 76 U/L — ABNORMAL HIGH (ref 15–41)
Albumin: 2.3 g/dL — ABNORMAL LOW (ref 3.5–5.0)
Alkaline Phosphatase: 62 U/L (ref 38–126)
Anion gap: 9 (ref 5–15)
BUN: 54 mg/dL — ABNORMAL HIGH (ref 8–23)
CO2: 32 mmol/L (ref 22–32)
Calcium: 8.1 mg/dL — ABNORMAL LOW (ref 8.9–10.3)
Chloride: 104 mmol/L (ref 98–111)
Creatinine, Ser: 1.64 mg/dL — ABNORMAL HIGH (ref 0.61–1.24)
GFR, Estimated: 44 mL/min — ABNORMAL LOW (ref >60)
Glucose, Bld: 129 mg/dL — ABNORMAL HIGH (ref 70–99)
Potassium: 4.6 mmol/L (ref 3.5–5.1)
Sodium: 145 mmol/L (ref 135–145)
Total Bilirubin: 1.2 mg/dL (ref 0.0–1.2)
Total Protein: 5.8 g/dL — ABNORMAL LOW (ref 6.5–8.1)

## 2024-02-28 LAB — GLUCOSE, CAPILLARY
Glucose-Capillary: 116 mg/dL — ABNORMAL HIGH (ref 70–99)
Glucose-Capillary: 121 mg/dL — ABNORMAL HIGH (ref 70–99)
Glucose-Capillary: 213 mg/dL — ABNORMAL HIGH (ref 70–99)
Glucose-Capillary: 69 mg/dL — ABNORMAL LOW (ref 70–99)
Glucose-Capillary: 163 mg/dL — ABNORMAL HIGH (ref 70–99)
Glucose-Capillary: 129 mg/dL — ABNORMAL HIGH (ref 70–99)

## 2024-02-28 LAB — COOXEMETRY PANEL
Carboxyhemoglobin: 1.6 % — ABNORMAL HIGH (ref 0.5–1.5)
Methemoglobin: 0.8 % (ref 0.0–1.5)
O2 Saturation: 57 %
Total hemoglobin: 7.6 g/dL — ABNORMAL LOW (ref 12.0–16.0)
Total oxygen content: 55.6 %

## 2024-02-28 LAB — PREPARE RBC (CROSSMATCH)

## 2024-02-28 LAB — PHOSPHORUS: Phosphorus: 4.1 mg/dL (ref 2.5–4.6)

## 2024-02-28 LAB — PROCALCITONIN: Procalcitonin: 0.8 ng/mL

## 2024-02-28 LAB — MAGNESIUM: Magnesium: 2.2 mg/dL (ref 1.7–2.4)

## 2024-02-28 MED ORDER — PANTOPRAZOLE SODIUM 40 MG IV SOLR
40.0000 mg | Freq: Two times a day (BID) | INTRAVENOUS | Status: DC
  Administered 2024-02-28 – 2024-03-12 (×27): 40 mg via INTRAVENOUS
  Filled 2024-02-28 (×27): qty 10

## 2024-02-28 MED ORDER — PIPERACILLIN-TAZOBACTAM 3.375 G IVPB
3.3750 g | Freq: Three times a day (TID) | INTRAVENOUS | Status: DC
  Administered 2024-02-28 – 2024-03-03 (×12): 3.375 g via INTRAVENOUS
  Filled 2024-02-28 (×12): qty 50

## 2024-02-28 MED ORDER — DEXTROSE 50 % IV SOLN
12.5000 g | INTRAVENOUS | Status: AC
  Administered 2024-02-28: 12.5 g via INTRAVENOUS
  Filled 2024-02-28: qty 50

## 2024-02-28 MED ORDER — QUETIAPINE FUMARATE 25 MG PO TABS
25.0000 mg | ORAL_TABLET | Freq: Three times a day (TID) | ORAL | Status: DC
  Administered 2024-02-28 – 2024-03-02 (×8): 25 mg
  Filled 2024-02-28 (×7): qty 1

## 2024-02-28 MED ORDER — IPRATROPIUM BROMIDE 0.02 % IN SOLN
0.5000 mg | Freq: Four times a day (QID) | RESPIRATORY_TRACT | Status: DC
  Administered 2024-02-28 – 2024-03-03 (×15): 0.5 mg via RESPIRATORY_TRACT
  Filled 2024-02-28 (×15): qty 2.5

## 2024-02-28 MED ORDER — POLYETHYLENE GLYCOL 3350 17 G PO PACK
17.0000 g | PACK | Freq: Every day | ORAL | Status: DC
  Administered 2024-02-28 – 2024-03-04 (×6): 17 g
  Filled 2024-02-28 (×6): qty 1

## 2024-02-28 MED ORDER — SCOPOLAMINE 1 MG/3DAYS TD PT72
1.0000 | MEDICATED_PATCH | TRANSDERMAL | Status: DC
  Administered 2024-02-28: 1.5 mg via TRANSDERMAL
  Filled 2024-02-28: qty 1

## 2024-02-28 MED ORDER — LEVALBUTEROL HCL 1.25 MG/0.5ML IN NEBU
1.2500 mg | INHALATION_SOLUTION | Freq: Four times a day (QID) | RESPIRATORY_TRACT | Status: DC
  Administered 2024-02-28 – 2024-03-03 (×15): 1.25 mg via RESPIRATORY_TRACT
  Filled 2024-02-28 (×14): qty 0.5

## 2024-02-28 MED ORDER — SODIUM CHLORIDE 0.9% IV SOLUTION: Freq: Once | INTRAVENOUS | Status: AC

## 2024-02-28 NOTE — Progress Notes (Addendum)
 SLP Cancellation Note  Patient Details Name: Miguel [name] MRN: [id number] DOB: [birth year]   Cancelled treatment:       Reason Eval/Treat Not Completed: Medical issues which prohibited therapy;Patient not medically ready (chart reviewed)  Per RT note this morning, pt w/ congested bilateral breath sounds throughout lung fields and suctioning was performed by RT w/ O2 support.  Pt is not appropriate for oral intake/assessment at this time in setting of medical status. ST services will f/u tomorrow to assess pt's status/readiness for po trials. Recommend continue oral care for hygiene and stimulation of swallowing.    Comer Portugal, MS, CCC-SLP Speech Language Pathologist Rehab Services; [facility] [phone] (ascom) Kinsly Hild 02/28/2024, 11:42 AM

## 2024-02-28 NOTE — Progress Notes (Signed)
 Daily Progress Note   Patient Name: Miguel [name]       Date: 02/28/2024 DOB: 1950/10/27  Age: 73 y.o. MRN#: [id number] Attending Physician: Malka Domino, MD Primary Care Physician: [facility] Admit Date: 02/20/2024  Reason for Consultation/Follow-up: Establishing goals of care  Subjective: Notes and labs reviewed.  In to see patient.  He is currently resting in bed at this time with no family at bedside.  No distress noted currently.  He he does not open his eyes to voice or touch.  Per nursing he has been awake and talkative today with family.  Per conversation yesterday continue full code and full scope treatment.  Will need time for outcomes.  PMT will follow-up Monday on my return to service.  Length of Stay: 8  Current Medications: Scheduled Meds:   Chlorhexidine  Gluconate Cloth  6 each Topical Daily   feeding supplement (PROSource TF20)  60 mL Per Tube Daily   free water   30 mL Per Tube Q4H   ipratropium  0.5 mg Nebulization Q6H   levalbuterol   1.25 mg Nebulization Q6H   metoprolol  tartrate  2.5 mg Intravenous Once   multivitamin with minerals  1 tablet Per Tube Daily   pantoprazole  (PROTONIX ) IV  40 mg Intravenous Q12H   polyethylene glycol  17 g Per Tube Daily   QUEtiapine   25 mg Per Tube TID   sodium chloride  flush  10-40 mL Intracatheter Q12H   sodium chloride  HYPERTONIC  4 mL Nebulization BID   thiamine  (VITAMIN B1) injection  100 mg Intravenous Daily    Continuous Infusions:  sodium chloride      amiodarone  30 mg/hr (02/28/24 1300)   dexmedetomidine  (PRECEDEX ) IV infusion 0.4 mcg/kg/hr (02/28/24 1300)   feeding supplement (OSMOLITE 1.5 CAL) Stopped (02/26/24 2245)   norepinephrine  (LEVOPHED ) Adult infusion 6 mcg/min (02/28/24 1300)    piperacillin -tazobactam (ZOSYN )  IV 12.5 mL/hr at 02/28/24 1300    PRN Meds: bisacodyl , docusate sodium , haloperidol  lactate, sodium chloride  flush  Physical Exam Constitutional:      Comments: Eyes closed  Pulmonary:     Effort: Pulmonary effort is normal.  Skin:    General: Skin is warm and dry.             Vital Signs: BP 105/76   Pulse (!) 134   Temp (!)  96.3 F (35.7 C) (Axillary)   Resp (!) 21   Ht 6' (1.829 m)   Wt 93.4 kg   SpO2 (!) 85%   BMI 27.93 kg/m  SpO2: SpO2: (!) 85 % O2 Device: O2 Device: High Flow Nasal Cannula O2 Flow Rate: O2 Flow Rate (L/min): 10 L/min  Intake/output summary:  Intake/Output Summary (Last 24 hours) at 02/28/2024 1517 Last data filed at 02/28/2024 1300 Gross per 24 hour  Intake 1550.24 ml  Output 375 ml  Net 1175.24 ml   LBM: Last BM Date : 02/25/24 Baseline Weight: Weight: 103.4 kg Most recent weight: Weight: 93.4 kg      Patient Active Problem List   Diagnosis Date Noted   Cardiac arrest (HCC) 02/20/2024   Malnutrition of moderate degree 02/20/2024   Aspiration pneumonia of both lungs due to gastric secretions (HCC) 02/20/2024   HFrEF (heart failure with reduced ejection fraction) (HCC) 02/20/2024   LV (left ventricular) mural thrombus 02/20/2024   Elevated troponin 02/20/2024   Transaminitis 02/20/2024   Dilated cardiomyopathy (HCC) 02/20/2024   Schizoaffective disorder, bipolar type (HCC) 09/14/2022   Hypertensive emergency 09/13/2022   Bilateral pleural effusion 09/13/2022   Cocaine use disorder (HCC) 09/13/2022   Acute on chronic systolic CHF (congestive heart failure) (HCC) 09/10/2022   Acute respiratory failure with hypoxia (HCC) 09/10/2022   Stroke (HCC) 07/12/2018   Noncompliance 04/11/2016    Palliative Care Assessment & Plan    Recommendations/Plan: Full code full scope Time for outcomes PMT will follow-up Monday on my return to service. Code Status:    Code Status Orders  (From admission, onward)            Start     Ordered   02/20/24 0934  Full code  Continuous       Question:  By:  Answer:  Default: patient does not have capacity for decision making, no surrogate or prior directive available   02/20/24 0935           Code Status History     Date Active Date Inactive Code Status Order ID Comments User Context   09/10/2022 1100 09/15/2022 2253 Full Code [id number]  Maranda Lonell MATSU, NP ED   07/12/2018 2318 07/15/2018 1504 Full Code [id number]  Jenel Lenis, MD ED       Thank you for allowing the Palliative Medicine Team to assist in the care of this patient.  Camelia Lewis, NP  Please contact Palliative Medicine Team phone at [phone] for questions and concerns.

## 2024-02-28 NOTE — Plan of Care (Signed)

## 2024-02-28 NOTE — Progress Notes (Signed)
 NAME:  Miguel [name], MRN:  [id number], DOB:  22-May-1951, LOS: 8 ADMISSION DATE:  02/20/2024, CONSULTATION DATE:  02/20/2024 REFERRING MD:  Dr. Levander, CHIEF COMPLAINT:  Cardiac Arrest   Brief Pt Description / Synopsis:  73 y.o. male with PMHx of biventricular CHF and polysubstance abuse admitted following out-of-hospital Cardiac Arrest (15 minutes downtime) with Cardiogenic shock due to severe Biventricular CHF, sepsis due to H. Influenzae and Strep Pneumoniae Pneumonia, AKI, shock liver, and Acute Metabolic Encephalopathy.  History of Present Illness:  This is a 73 yo male who [facility] to [facility] ER [facility] from jail on 06/26 post cardiac arrest.  Per ER notes pts cellmate heard the pt gurgling this morning.  He was found pulseless with initial cardiac rhythm asystole.  EMS administered 3 doses of epi/2 mg of narcan .  Following administration of narcan  ROSC achieved.  Estimated downtime 15 minutes.  EMS reported en route to the ER pt became agitated and attempted to remove Igel.  EMS administered versed .  Pt became pulseless again en route ACLS protocol initiated.   ED Course  Upon arrival to the ER pt remained pulseless in PEA.  ACLS protocol continued pt received: 2 mg of epi/4 mg of iv narcan /1 amp of bicarb/1 g of calcium .  Igel exchanged for an ETT with ongoing CPR (pt did not require RSI medications for intubation).  ROSC achieved with estimated downtime during 2nd cardiac arrest 15 minutes.  CTA Chest negative for PE.  Significant lab results were: Na+ 129/K+ 5.3/chloride 90/glucose 110/BUN 49/creatinine 2.22/calcium  7.5/mag 3.2/alk phos 134/AST 3,301/ALT 2,038/total bilirubin 5.5/troponin 344/lactic acid 8.1/urine drug screen negative/UA concerning for possible UTI.  Sepsis protocol initiated pt to receive: cefepime /metronidazole /vancomycin .  Pt found to be severely hypoglycemic CBG 17 requiring 1 amp of D50W.  Levophed  gtt initiated due to hypotension.  PCCM team contacted for ICU admission.  Pt  remains in police custody with bilateral ankle cuffs in place, and police officer at bedside.   PCCM asked to admit for further workup and treatment.  Please see Significant Hospital Events section below for full detailed hospital course.   Pertinent  Medical History  Schizophrenia  Depression Current Smoker  Chronic Systolic CHF (Echo 09/11/22: EF <20%, grade II diastolic dysfunction, trivial pericardial effusion, mild mitral valve regurgitation, trivial tricuspid valve regurgitation, trivial pulmonic valve regurgitation) CVA with aphasia  Micro Data:  MRSA PCR 06/26>>negative  COVID/Influenza A&B/RSV 06/26>>negative  RVP 06/26>>negative  Blood 06/26>>No Growth Tracheal aspirate 06/26>>H influenza & strep pneumoniae  Urine 06/26>>less than 10,000 colonies/ml insignificant growth  Strep pneumoniae & Legionella pneum ur ag 06/26>>negative   Antimicrobials:   Anti-infectives (From admission, onward)    Start     Dose/Rate Route Frequency Ordered Stop   02/22/24 1100  cefTRIAXone  (ROCEPHIN ) 2 g in sodium chloride  0.9 % 100 mL IVPB        2 g 200 mL/hr over 30 Minutes Intravenous Every 24 hours 02/22/24 0952 02/27/24 1134   02/22/24 1100  azithromycin  (ZITHROMAX ) 500 mg in sodium chloride  0.9 % 250 mL IVPB  Status:  Discontinued        500 mg 250 mL/hr over 60 Minutes Intravenous Every 24 hours 02/22/24 0952 02/24/24 1009   02/21/24 0000  ceFEPIme  (MAXIPIME ) 2 g in sodium chloride  0.9 % 100 mL IVPB  Status:  Discontinued        2 g 200 mL/hr over 30 Minutes Intravenous Every 12 hours 02/20/24 1051 02/22/24 0952   02/20/24 1200  vancomycin  (VANCOREADY) IVPB  2000 mg/400 mL        2,000 mg 200 mL/hr over 120 Minutes Intravenous  Once 02/20/24 1007 02/20/24 1400   02/20/24 1050  vancomycin  variable dose per unstable renal function (pharmacist dosing)  Status:  Discontinued         Does not apply See admin instructions 02/20/24 1051 02/21/24 0925   02/20/24 0845  ceFEPIme   (MAXIPIME ) 2 g in sodium chloride  0.9 % 100 mL IVPB        2 g 200 mL/hr over 30 Minutes Intravenous  Once 02/20/24 0838 02/20/24 1410   02/20/24 0845  metroNIDAZOLE  (FLAGYL ) IVPB 500 mg        500 mg 100 mL/hr over 60 Minutes Intravenous  Once 02/20/24 0838 02/20/24 1559   02/20/24 0845  vancomycin  (VANCOCIN ) IVPB 1000 mg/200 mL premix  Status:  Discontinued        1,000 mg 200 mL/hr over 60 Minutes Intravenous  Once 02/20/24 0838 02/20/24 1007       Significant Hospital Events: Including procedures, antibiotic start and stop dates in addition to other pertinent events   06/26: Pt admitted post cardiac arrest mechanically intubated with possible cardiogenic/possible septic shock with multiorgan failure requiring levophed  and dobutamine  gtts 06/27: on minimal sedation, discontinued this AM. Off nor-epi, continued on dobutamine  06/28: decreased dobutamine  to 2.5, remains off sedation 06/29: arouses to tactile and verbal stimuli, remains disoriented. Failed SBT this AM. Dobutamine  increased to 5 mcg overnight due to bradycardia 06/30: Pt remains mechanically intubated, however neuro exam precludes extubation.  He has not received sedation since 06/27. Dobutamine  discontinued  06/30: MRI Brain Since the previous MRI, the patient has developed chronic encephalomalacia changes within the right medial occipital lobe compatible with an interval PCA distribution infarct. Interval development of encephalomalacia in area of previously demonstrated left MCA distribution infarct, which was acute on the previous MRI. 07/01: Overnight pt remained in atrial flutter with rvr hr 140 to 150's with possible ST elevation despite iv metoprolol .  Cardiologist Dr Darron notified recommended amiodarone  bolus followed by amiodarone  gtt for rate control.  However, this morning while on amiodarone  gtt pt became severely bradycardic hr as low as 27 bpm with associated hypotension, amiodarone  gtt discontinued he is now  requiring low dose levophed  gtt.  Self extubated.  Delirious requiring Precedex . 07/02: No significant events noted overnight.  Remains on Precedex  due to delirium, currently protecting his airway.  Afebrile, hemodynamically stable, Levophed  weaned off, Bradycardia resolved, no reports of tachyarrhythmias.  Dobutamine  weaned to 2.5 and Coox remains stable at 83, will d/c Dobutamine .  Speech/PT/OT consulted.  Failed speech evaluation, plan to place Dobhoff as able, remove foley. 07/03: No significant events noted overnight. Remains on low dose precedex  (0.4 mcg) due to delirium.  Afebrile, hemodynamically stable, no vasopressors.  Coox 73 and Lactic 1.1 OFF of inotropes and OFF Levophed .  Dobhoff with difficultly functioning, will exchange it to a regular NGT for feeds and med administration.  Gentle diuresis with 20 mg IV Lasix  x1 dose.  Hgb decreased to 9 from 11.7, no signs of bleeding, will d/c Heparin  gtt given no thrombus on repeat Echo. 07/04: Overnight Levophed  reinitiated, Hgb dropped to 6.4 (no reports of bleeding from nursing, abdominal exam is benign), ordered for 1 unit pRBC's, Lovenox  d/c,  increase PPI to BID dosing.  With worsening Leukocytosis up to 15.5 from 12.8 and increased FiO2 requirements to up 10 L (overnight deep Nasotracheal suctioning yielded copious amounts of thick secretions) , will check PCT  and start empiric Zosyn  for aspiration coverage.  WOB is improved from yesterday, on low dose Precedex  but more interactive today.  Again unable to complete speech evaluation, will attempt NG tube placement again.  Interim History / Subjective:  As outlined above under Significant Hospital Events section  Objective   Blood pressure (!) 90/54, pulse 65, temperature (!) 97 F (36.1 C), temperature source Axillary, resp. rate (!) 0, height 6' (1.829 m), weight 93.4 kg, SpO2 100%. CVP:  [6 mmHg-20 mmHg] 14 mmHg      Intake/Output Summary (Last 24 hours) at 02/28/2024 0754 Last data filed  at 02/28/2024 0400 Gross per 24 hour  Intake 1032.9 ml  Output 650 ml  Net 382.9 ml   Filed Weights   02/26/24 0500 02/27/24 0500 02/28/24 0500  Weight: 91.9 kg 92 kg 93.4 kg    Examination: General: Acutely ill appearing male, laying in bed, on 10 L [place], lightly sedated on Precedex , in NAD HENT: Atraumatic, normocephalic, neck supple, no JVD Lungs: Coarse breath sounds throughout, even, nonlabored, normal effort Cardiovascular: RRR, no M/R/G Abdomen: Soft, nontender, nondistended, no guarding or rebound tenderness, BS+ x4 Extremities: Normal bulk and tone, no deformities, no cyanosis, 1+ edema bilateral LE Neuro: Lightly sedated on 0.3 mcg Precedex , arouses easily to voice, moves all extremities to commands with no focal deficits noted, does not answer orientation questions, pupils PERRL GU: External male catheter in place draining yellow urine  Resolved Hospital Problem list   Hyponatremia  Lactic acidosis  Hyperkalemia   Assessment & Plan:   #Cardiac arrest (PEA)  #Multifactorial Shock: Cardiogenic +septic + hypovolemic/hemorrhagic #Acute on chronic Biventricular CHF #Elevated troponin suspect secondary to demand ischemia vs NSTEMI #Possible mural thrombus ~ LOW SUSPICION  Hx: CVA with aphasia  CTA Chest PE 02/20/24: No evidence of acute pulmonary embolism. Rounded filling defect within the right atrial appendage, suspicious for thrombus. Echo 02/20/24: EF 20 to 25%, indeterminate diastolic parameters, possible mural thrombus in apical region, RV systolic function is severely reduced, RV size moderately enlarged -Repeat Echocardiogram 06/30: no definitive LV thrombus -Continuous cardiac monitoring -Maintain MAP >65 -Vasopressors as needed to maintain MAP goal  -Follow Coox and CVP -Dobutamine  d/c on 7/2 -HS Troponin peaked at 2,551 -Diuresis as BP and renal function permits ~ holding 7/4 due to vasopressors and AKI -D/c Heparin  gtt on 7/3 due to anemia -Cardiology  following, appreciate input ~ considering cardiac cath at some point ~ Per Cardiology pt is NOT a good candidate for advanced therapies such as ICD and LVAD due to polysubstance abuse hx and medication noncompliance  #Acute Hypoxic Respiratory Failure  #Bilateral pleural effusions right greater than left  #H influenza and strep pneumoniae Pneumonia Hx: Tobacco abuse  SELF-EXTUBATED 02/25/24 ~ currently protecting his airway but remains high risk for  aspiration and reintubation -Supplemental O2 as needed to maintain O2 sats >92% -Follow intermittent Chest X-ray & ABG as needed -Bronchodilators & Hypertonic saline nebs -ABX as above -Diuresis as BP and renal function permits ~ holding 7/4 due to vasopressors and AKI -Aggressive Pulmonary toilet as able, mobilize as able  #Sepsis #H. Influenza & Strep Pneumoniae Pneumonia ~ TREATED #Worsening Leukocytosis, concern for ongoing Aspiration -Monitor fever curve -Trend WBC's -Follow cultures as above -Completed 5 day course of Ceftriaxone  ~  will start empiric Zosyn  on 7/4  #Acute Kidney Injury secondary to ATN  -Monitor I&O's / urinary output -Follow BMP -Ensure adequate renal perfusion -Avoid nephrotoxic agents as able -Replace electrolytes as indicated ~ Pharmacy following  for assistance with electrolyte replacement -Gentle IV fluids  #Shock liver ~ IMPROVING #Elevated alk phos and total bilirubin  CT Abd/Pelvis 06/26: Mild diffuse mural thickening, which may be seen in the setting of hypoperfusion. Small volume free fluid. HCV ab 06/26: reactive  - Trend hepatic function panel & coags - Avoid hepatotoxic agents as able   #Anemia without signs of overt bleeding  -Trend CBC  -Monitor for s/sx of bleeding  -SCD for DVT ppx (heparin  gtt d/c on 7/3 as no evidence of thrombus on repeat Limited Echo, d/c Lovenox  on 7/4) -Transfuse fo hgb <7 ~ to receive 1 unit pRBC's on 7/4 -Increase PPI to BID dosing   #Hypoglycemia  -CBG's q4hrs  for now  -Follow hypo/hyperglycemic protocol -Target CBG range 140 to 180  #Acute metabolic encephalopathy  Hx: Polysubstance abuse, depression, and schizoaffective disorder EEG 02/21/24: Study is suggestive of severe to profound diffuse encephalopathy. No seizures or epileptiform discharges were seen throughout the recording. MRI Brain 02/24/24: Since the previous MRI, the patient has developed chronic encephalomalacia changes within the right medial occipital lobe compatible with an interval PCA distribution infarct. Interval development of encephalomalacia in area of previously demonstrated left MCA distribution infarct, which was acute on the previous MRI. -UDS was negative -Treatment of metabolic derangements as outlined above -Provide supportive care -Promote normal sleep/wake cycle and family presence -Avoid sedating medications as able -Continue Precedex  as needed, wean as able -Once NGT placed, will start scheduled Seroquel  in attempts to wean precedex  (on Aristada outpatient)   Pt is critically ill with multiorgan failure. Prognosis is guarded, high risk for further decompensation, cardiac arrest, and death.  Given current critical illness superimposed on multiple chronic co-morbidities and advanced age, overall long term prognosis is poor.  Recommend consideration of DNR/DNI status.  Palliative Care following to assist with GOC discussions.   Best Practice (right click and Reselect all SmartList Selections daily)   Diet/type: NPO, failed speech eval, place NGT for tube feeds DVT prophylaxis: SCD GI prophylaxis: PPI Lines: Central line and yes and it is still needed Foley:  N/A Code Status:  full code Last date of multidisciplinary goals of care discussion [7/4]  7/4: Will update pt's sister when she arrives at bedside on plan of care.  Labs   CBC: Recent Labs  Lab 02/24/24 0354 02/25/24 0304 02/26/24 0401 02/27/24 0243 02/28/24 0306  WBC 20.9* 12.3* 11.9* 12.8*  15.5*  HGB 11.9* 11.4* 11.7* 9.0* 6.4*  HCT 35.8* 35.8* 36.4* 28.6* 19.9*  MCV 95.5 97.0 98.4 101.1* 100.0  PLT 238 246 250 248 279    Basic Metabolic Panel: Recent Labs  Lab 02/24/24 0354 02/24/24 1146 02/25/24 0304 02/26/24 0401 02/27/24 0243 02/28/24 0306  NA 141  --  143 141 142 145  K 4.3  --  4.0 4.6 4.7 4.6  CL 98  --  103 100 101 104  CO2 33*  --  32 31 34* 32  GLUCOSE 117*  --  117* 85 98 129*  BUN 52*  --  52* 43* 40* 54*  CREATININE 1.04  --  0.89 0.97 0.77 1.64*  CALCIUM  7.9*  --  8.2* 8.2* 8.2* 8.1*  MG  --  2.4 2.7* 2.2 2.2 2.2  PHOS 2.8  --  2.3* 4.3 3.9 4.1   GFR: Estimated Creatinine Clearance: 49 mL/min (A) (by C-G formula based on SCr of 1.64 mg/dL (H)). Recent Labs  Lab 02/22/24 0802 02/23/24 0422 02/25/24 0304 02/26/24 0401 02/27/24 0243 02/28/24 9693  WBC  --    < > 12.3* 11.9* 12.8* 15.5*  LATICACIDVEN 1.3  --   --   --  1.1  --    < > = values in this interval not displayed.    Liver Function Tests: Recent Labs  Lab 02/24/24 0354 02/25/24 0305 02/26/24 0401 02/27/24 0243 02/28/24 0306  AST  --  146* 116* 87* 76*  ALT  --  564* 454* 333* 251*  ALKPHOS  --  84 78 68 62  BILITOT  --  1.5* 1.5* 1.5* 1.2  PROT  --  5.3* 5.9* 6.0* 5.8*  ALBUMIN 2.6* 2.4* 2.5* 2.5* 2.3*   No results for input(s): LIPASE, AMYLASE in the last 168 hours. Recent Labs  Lab 02/25/24 1046  AMMONIA 31    ABG    Component Value Date/Time   PHART 7.42 02/27/2024 0817   PCO2ART 57 (H) 02/27/2024 0817   PO2ART 102 02/27/2024 0817   HCO3 37.0 (H) 02/27/2024 0817   ACIDBASEDEF 1.1 02/20/2024 1452   O2SAT 98.7 02/27/2024 0817     Coagulation Profile: Recent Labs  Lab 02/26/24 0401  INR 1.2    Cardiac Enzymes: No results for input(s): CKTOTAL, CKMB, CKMBINDEX, TROPONINI in the last 168 hours.  HbA1C: Hgb A1c MFr Bld  Date/Time Value Ref Range Status  07/14/2018 04:29 AM 5.8 (H) 4.8 - 5.6 % Final    Comment:    (NOTE) Pre diabetes:           5.7%-6.4% Diabetes:              >6.4% Glycemic control for   <7.0% adults with diabetes   07/13/2018 04:25 AM 6.2 (H) 4.8 - 5.6 % Final    Comment:    (NOTE) Pre diabetes:          5.7%-6.4% Diabetes:              >6.4% Glycemic control for   <7.0% adults with diabetes     CBG: Recent Labs  Lab 02/27/24 1703 02/27/24 2049 02/27/24 2349 02/28/24 0314 02/28/24 0736  GLUCAP 103* 99 106* 116* 121*    Review of Systems:   Unable to assess due to AMS  Past Medical History:  He,  has a past medical history of Biventricular failure (HCC), CVA (cerebral vascular accident) (HCC), Polysubstance use disorder, and Schizophrenia (HCC).   Surgical History:   Past Surgical History:  Procedure Laterality Date   APPENDECTOMY       Social History:   reports that he has never smoked. He has never used smokeless tobacco. He reports that he does not drink alcohol and does not use drugs.   Family History:  His Family history is unknown by patient.   Allergies No Known Allergies   Home Medications  Prior to Admission medications   Medication Sig Start Date End Date Taking? Authorizing Provider  ARISTADA 882 MG/3.2ML prefilled syringe Inject 882 mg into the muscle every 30 (thirty) days. 08/23/22  Yes [provider]  atorvastatin  (LIPITOR) 40 MG tablet Take 1 tablet (40 mg total) by mouth daily at 6 PM. 07/15/18  Yes Laurence Bridegroom, MD  furosemide  (LASIX ) 40 MG tablet Take 1 tablet (40 mg total) by mouth daily. 06/19/22 02/20/24 Yes Arlander Charleston, MD  losartan (COZAAR) 25 MG tablet Take 25 mg by mouth daily. 02/10/24  Yes [provider]     Critical care time: 40 minutes     Inge Lecher, AGACNP-BC [facility]  Prefer epic messenger for cross cover needs If after hours, please call E-link

## 2024-02-28 NOTE — Progress Notes (Signed)
 Precedex  titrated per NP verbal orders.

## 2024-02-29 DIAGNOSIS — B963 Hemophilus influenzae [H. influenzae] as the cause of diseases classified elsewhere: Secondary | ICD-10-CM | POA: Diagnosis not present

## 2024-02-29 DIAGNOSIS — I5023 Acute on chronic systolic (congestive) heart failure: Secondary | ICD-10-CM | POA: Diagnosis not present

## 2024-02-29 DIAGNOSIS — G9341 Metabolic encephalopathy: Secondary | ICD-10-CM | POA: Diagnosis not present

## 2024-02-29 DIAGNOSIS — I469 Cardiac arrest, cause unspecified: Secondary | ICD-10-CM | POA: Diagnosis not present

## 2024-02-29 LAB — GLUCOSE, CAPILLARY
Glucose-Capillary: 107 mg/dL — ABNORMAL HIGH (ref 70–99)
Glucose-Capillary: 119 mg/dL — ABNORMAL HIGH (ref 70–99)
Glucose-Capillary: 119 mg/dL — ABNORMAL HIGH (ref 70–99)
Glucose-Capillary: 136 mg/dL — ABNORMAL HIGH (ref 70–99)
Glucose-Capillary: 95 mg/dL (ref 70–99)
Glucose-Capillary: 95 mg/dL (ref 70–99)
Glucose-Capillary: 97 mg/dL (ref 70–99)
Glucose-Capillary: 99 mg/dL (ref 70–99)

## 2024-02-29 LAB — BLOOD GAS, ARTERIAL
Acid-Base Excess: 8 mmol/L — ABNORMAL HIGH (ref 0.0–2.0)
Bicarbonate: 34.7 mmol/L — ABNORMAL HIGH (ref 20.0–28.0)
O2 Content: 6 L/min
O2 Saturation: 99.4 %
Patient temperature: 37
pCO2 arterial: 56 mmHg — ABNORMAL HIGH (ref 32–48)
pH, Arterial: 7.4 (ref 7.35–7.45)
pO2, Arterial: 118 mmHg — ABNORMAL HIGH (ref 83–108)

## 2024-02-29 LAB — COOXEMETRY PANEL
Carboxyhemoglobin: 1.6 % — ABNORMAL HIGH (ref 0.5–1.5)
Methemoglobin: <0.7 % (ref 0.0–1.5)
O2 Saturation: 71.2 %
Total hemoglobin: 7.6 g/dL — ABNORMAL LOW (ref 12.0–16.0)
Total oxygen content: 69.9 %

## 2024-02-29 LAB — CBC
HCT: 21.8 % — ABNORMAL LOW (ref 39.0–52.0)
HCT: 21.8 % — ABNORMAL LOW (ref 39.0–52.0)
Hemoglobin: 7.5 g/dL — ABNORMAL LOW (ref 13.0–17.0)
Hemoglobin: 7.1 g/dL — ABNORMAL LOW (ref 13.0–17.0)
MCH: 34.1 pg — ABNORMAL HIGH (ref 26.0–34.0)
MCH: 31.8 pg (ref 26.0–34.0)
MCHC: 34.4 g/dL (ref 30.0–36.0)
MCHC: 32.6 g/dL (ref 30.0–36.0)
MCV: 99.1 fL (ref 80.0–100.0)
MCV: 97.8 fL (ref 80.0–100.0)
Platelets: 346 K/uL (ref 150–400)
Platelets: 329 K/uL (ref 150–400)
RBC: 2.2 MIL/uL — ABNORMAL LOW (ref 4.22–5.81)
RBC: 2.23 MIL/uL — ABNORMAL LOW (ref 4.22–5.81)
RDW: 16.4 % — ABNORMAL HIGH (ref 11.5–15.5)
RDW: 16.8 % — ABNORMAL HIGH (ref 11.5–15.5)
WBC: 13.1 K/uL — ABNORMAL HIGH (ref 4.0–10.5)
WBC: 13.8 K/uL — ABNORMAL HIGH (ref 4.0–10.5)
nRBC: 2.5 % — ABNORMAL HIGH (ref 0.0–0.2)
nRBC: 3.2 % — ABNORMAL HIGH (ref 0.0–0.2)

## 2024-02-29 LAB — PHOSPHORUS
Phosphorus: 4.2 mg/dL (ref 2.5–4.6)
Phosphorus: 4.1 mg/dL (ref 2.5–4.6)

## 2024-02-29 LAB — COMPREHENSIVE METABOLIC PANEL WITH GFR
ALT: 221 U/L — ABNORMAL HIGH (ref 0–44)
AST: 74 U/L — ABNORMAL HIGH (ref 15–41)
Albumin: 2.5 g/dL — ABNORMAL LOW (ref 3.5–5.0)
Alkaline Phosphatase: 62 U/L (ref 38–126)
Anion gap: 10 (ref 5–15)
BUN: 56 mg/dL — ABNORMAL HIGH (ref 8–23)
CO2: 32 mmol/L (ref 22–32)
Calcium: 8.1 mg/dL — ABNORMAL LOW (ref 8.9–10.3)
Chloride: 102 mmol/L (ref 98–111)
Creatinine, Ser: 1.77 mg/dL — ABNORMAL HIGH (ref 0.61–1.24)
GFR, Estimated: 41 mL/min — ABNORMAL LOW (ref >60)
Glucose, Bld: 111 mg/dL — ABNORMAL HIGH (ref 70–99)
Potassium: 4.7 mmol/L (ref 3.5–5.1)
Sodium: 144 mmol/L (ref 135–145)
Total Bilirubin: 1.4 mg/dL — ABNORMAL HIGH (ref 0.0–1.2)
Total Protein: 5.9 g/dL — ABNORMAL LOW (ref 6.5–8.1)

## 2024-02-29 LAB — TROPONIN I (HIGH SENSITIVITY)
Troponin I (High Sensitivity): 3797 ng/L (ref <18)
Troponin I (High Sensitivity): 4321 ng/L (ref <18)
Troponin I (High Sensitivity): 3591 ng/L (ref <18)

## 2024-02-29 LAB — MAGNESIUM: Magnesium: 2.4 mg/dL (ref 1.7–2.4)

## 2024-02-29 LAB — LACTIC ACID, PLASMA: Lactic Acid, Venous: 1 mmol/L (ref 0.5–1.9)

## 2024-02-29 LAB — PREPARE RBC (CROSSMATCH)

## 2024-02-29 MED ORDER — DIGOXIN 250 MCG PO TABS
0.5000 mg | ORAL_TABLET | Freq: Once | ORAL | Status: AC
  Administered 2024-02-29: 0.5 mg
  Filled 2024-02-29: qty 2

## 2024-02-29 MED ORDER — HEPARIN NA (PORK) LOCK FLSH PF 10 UNIT/ML IV SOLN
10.0000 [IU] | Freq: Once | INTRAVENOUS | Status: AC
  Administered 2024-02-29: 10 [IU] via INTRAVENOUS
  Filled 2024-02-29: qty 5

## 2024-02-29 MED ORDER — AMIODARONE IV BOLUS ONLY 150 MG/100ML
150.0000 mg | Freq: Once | INTRAVENOUS | Status: AC
  Administered 2024-02-29: 150 mg via INTRAVENOUS
  Filled 2024-02-29: qty 100

## 2024-02-29 MED ORDER — DIAZEPAM 5 MG/ML IJ SOLN
5.0000 mg | Freq: Four times a day (QID) | INTRAMUSCULAR | Status: DC | PRN
  Administered 2024-02-29 – 2024-03-01 (×3): 5 mg via INTRAVENOUS
  Filled 2024-02-29 (×3): qty 2

## 2024-02-29 MED ORDER — DIGOXIN 250 MCG PO TABS
0.5000 mg | ORAL_TABLET | Freq: Once | ORAL | Status: DC
  Filled 2024-02-29: qty 2

## 2024-02-29 MED ORDER — CHLORHEXIDINE GLUCONATE CLOTH 2 % EX PADS
6.0000 | MEDICATED_PAD | Freq: Every day | CUTANEOUS | Status: DC
  Administered 2024-02-29 – 2024-03-12 (×12): 6 via TOPICAL

## 2024-02-29 MED ORDER — DIAZEPAM 5 MG PO TABS: 5.0000 mg | ORAL_TABLET | Freq: Two times a day (BID) | ORAL | Status: DC | PRN

## 2024-02-29 MED ORDER — FREE WATER
200.0000 mL | Status: DC
  Administered 2024-02-29 – 2024-03-04 (×21): 200 mL

## 2024-02-29 MED ORDER — SODIUM CHLORIDE 0.9% IV SOLUTION: Freq: Once | INTRAVENOUS | Status: AC

## 2024-02-29 MED ORDER — LACTATED RINGERS IV BOLUS
250.0000 mL | Freq: Once | INTRAVENOUS | Status: AC
  Administered 2024-02-29: 250 mL via INTRAVENOUS

## 2024-02-29 MED ORDER — FENTANYL CITRATE PF 50 MCG/ML IJ SOSY
25.0000 ug | PREFILLED_SYRINGE | INTRAMUSCULAR | Status: DC | PRN
  Administered 2024-03-01 – 2024-03-02 (×5): 25 ug via INTRAVENOUS
  Filled 2024-02-29 (×7): qty 1

## 2024-02-29 MED ORDER — DIGOXIN 250 MCG PO TABS
0.2500 mg | ORAL_TABLET | Freq: Once | ORAL | Status: AC
  Administered 2024-02-29: 0.25 mg
  Filled 2024-02-29: qty 1

## 2024-02-29 MED ORDER — NOREPINEPHRINE 16 MG/250ML-% IV SOLN
0.0000 ug/min | INTRAVENOUS | Status: DC
  Administered 2024-02-29: 10 ug/min via INTRAVENOUS
  Administered 2024-03-01: 1 ug/min via INTRAVENOUS
  Administered 2024-03-02: 2 ug/min via INTRAVENOUS
  Filled 2024-02-29 (×2): qty 250

## 2024-02-29 NOTE — Progress Notes (Signed)
 MD notified pt's HR sustaining in 140s again.

## 2024-02-29 NOTE — Progress Notes (Addendum)
 MD notified patient's HR sustaining around 140. See new orders for amio bolus.

## 2024-02-29 NOTE — Progress Notes (Signed)
 SLP Cancellation Note  Patient Details Name: Miguel [name] MRN: [id number] DOB: 1950-12-21   Cancelled treatment:       Reason Eval/Treat Not Completed: Patient's level of consciousness;Other (comment) (Pt resting upon arrival to room. RN reporting agitation this AM requiring medication- and indicated that pt is not ready for PO intake. SLP in agreement. Plan to follow up as able and when pt is approprate.)   [place] Ayat Drenning Clapp, MS, CCC-SLP Speech Language Pathologist Rehab Services; [facility] [phone] (ascom)   [place] J Clapp 02/29/2024, 11:48 AM

## 2024-02-29 NOTE — Progress Notes (Signed)
 NAME:  TREYTEN MONESTIME, MRN:  [id number], DOB:  [birth year], LOS: 9 ADMISSION DATE:  02/20/2024  History of Present Illness:  This is a 73 yo male who presented to [facility] ER via EMS from jail on 06/26 post cardiac arrest.  Per ER notes pts cellmate heard the pt gurgling this morning.  He was found pulseless with initial cardiac rhythm asystole.  EMS administered 3 doses of epi/2 mg of narcan .  Following administration of narcan  ROSC achieved.  Estimated downtime 15 minutes.  EMS reported en route to the ER pt became agitated and attempted to remove Igel.  EMS administered versed .  Pt became pulseless again en route ACLS protocol initiated.   ED Course  Upon arrival to the ER pt remained pulseless in PEA.  ACLS protocol continued pt received: 2 mg of epi/4 mg of iv narcan /1 amp of bicarb/1 g of calcium .  Igel exchanged for an ETT with ongoing CPR (pt did not require RSI medications for intubation).  ROSC achieved with estimated downtime during 2nd cardiac arrest 15 minutes.  CTA Chest negative for PE.  Significant lab results were: Na+ 129/K+ 5.3/chloride 90/glucose 110/BUN 49/creatinine 2.22/calcium  7.5/mag 3.2/alk phos 134/AST 3,301/ALT 2,038/total bilirubin 5.5/troponin 344/lactic acid 8.1/urine drug screen negative/UA concerning for possible UTI.  Sepsis protocol initiated pt to receive: cefepime /metronidazole /vancomycin .  Pt found to be severely hypoglycemic CBG 17 requiring 1 amp of D50W.  Levophed  gtt initiated due to hypotension.  PCCM team contacted for ICU admission.  Pt remains in police custody with bilateral ankle cuffs in place, and police officer at bedside.    PCCM asked to admit for further workup and treatment.   Please see Significant Hospital Events section below for full detailed hospital course.  Pertinent  Medical History  Schizophrenia  Depression Current Smoker  Chronic Systolic CHF (Echo 09/11/22: EF <20%, grade II diastolic dysfunction, trivial pericardial effusion, mild  mitral valve regurgitation, trivial tricuspid valve regurgitation, trivial pulmonic valve regurgitation) CVA with aphasia  Significant Hospital Events: Including procedures, antibiotic start and stop dates in addition to other pertinent events    06/26: Pt admitted post cardiac arrest mechanically intubated with possible cardiogenic/possible septic shock with multiorgan failure requiring levophed  and dobutamine  gtts 06/27: on minimal sedation, discontinued this AM. Off nor-epi, continued on dobutamine  06/28: decreased dobutamine  to 2.5, remains off sedation 06/29: arouses to tactile and verbal stimuli, remains disoriented. Failed SBT this AM. Dobutamine  increased to 5 mcg overnight due to bradycardia 06/30: Pt remains mechanically intubated, however neuro exam precludes extubation.  He has not received sedation since 06/27. Dobutamine  discontinued  06/30: MRI Brain Since the previous MRI, the patient has developed chronic encephalomalacia changes within the right medial occipital lobe compatible with an interval PCA distribution infarct. Interval development of encephalomalacia in area of previously demonstrated left MCA distribution infarct, which was acute on the previous MRI. 07/01: Overnight pt remained in atrial flutter with rvr hr 140 to 150's with possible ST elevation despite iv metoprolol .  Cardiologist Dr Darron notified recommended amiodarone  bolus followed by amiodarone  gtt for rate control.  However, this morning while on amiodarone  gtt pt became severely bradycardic hr as low as 27 bpm with associated hypotension, amiodarone  gtt discontinued he is now requiring low dose levophed  gtt.  Self extubated.  Delirious requiring Precedex . 07/02: No significant events noted overnight.  Remains on Precedex  due to delirium, currently protecting his airway.  Afebrile, hemodynamically stable, Levophed  weaned off, Bradycardia resolved, no reports of tachyarrhythmias.  Dobutamine  weaned to 2.5 and  Coox  remains stable at 83, will d/c Dobutamine .  Speech/PT/OT consulted.  Failed speech evaluation, plan to place Dobhoff as able, remove foley. 07/03: No significant events noted overnight. Remains on low dose precedex  (0.4 mcg) due to delirium.  Afebrile, hemodynamically stable, no vasopressors.  Coox 73 and Lactic 1.1 OFF of inotropes and OFF Levophed .  Dobhoff with difficultly functioning, will exchange it to a regular NGT for feeds and med administration.  Gentle diuresis with 20 mg IV Lasix  x1 dose.  Hgb decreased to 9 from 11.7, no signs of bleeding, will d/c Heparin  gtt given no thrombus on repeat Echo. 07/04: Overnight Levophed  reinitiated, Hgb dropped to 6.4 (no reports of bleeding from nursing, abdominal exam is benign), ordered for 1 unit pRBC's, Lovenox  d/c,  increase PPI to BID dosing.  With worsening Leukocytosis up to 15.5 from 12.8 and increased FiO2 requirements to up 10 L (overnight deep Nasotracheal suctioning yielded copious amounts of thick secretions) , will check PCT and start empiric Zosyn  for aspiration coverage.  WOB is improved from yesterday, on low dose Precedex  but more interactive today.  Again unable to complete speech evaluation, will attempt NG tube placement again. Interim History / Subjective:  -- Patient remain with suboptimal mental status. Will try to wean down precedex  today. Started on Seroquel  yesterday.  -- Heart rate reamins in the 140s in A. Flutter. Will plan to start Digoxin  today.  -- Start clear liquid and advance as tolerated.  -- Net even overnight.   Objective    Blood pressure 102/72, pulse (!) 36, temperature 97.7 F (36.5 C), temperature source Axillary, resp. rate (!) 39, height 6' (1.829 m), weight 90.6 kg, SpO2 98%. CVP:  [0 mmHg-38 mmHg] 21 mmHg      Intake/Output Summary (Last 24 hours) at 02/29/2024 0751 Last data filed at 02/29/2024 0729 Gross per 24 hour  Intake 1665.6 ml  Output 1350 ml  Net 315.6 ml   Filed Weights   02/27/24 0500  02/28/24 0500 02/29/24 0500  Weight: 92 kg 93.4 kg 90.6 kg    Examination: GEN Lethargic, able to follow minimal commands.  HEENT Supple neck, reactive pupils  CVS Tachy, regular rhythm  Abdomen Soft, non tender, non distended  Extremities Warm and well perfused, no edema.   Labs and imaging were reviewed.   Assessment and Plan  #Out of hospital cardiac arrest s/p intubation and MV 06/26 s/p extubation 07/01. 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  # BiV faiure with Echocardiogram with new reduced EF 20% w/ regional wall motion abnormalities noted. RV systolic function severely reduced.  #Encephalopathy secondary to the above.  #Mixed shock - cardiogenic and possibly distributive.  #Now w/ Acute anemia unclear source, suspecting GI bleed. Stable Hgb at 7.5.  #AKI with Cr. 1.64 mg/dl from a baseline of 0.9 mg/dl.  #History of crack and cocaine use  #New onset A.flutter on amiodarone  drip, holding AC in the setting of acute anemia.    Neuro: wean down Precedex  as tolerated, c/w Seroquel  25mg  TID.  CVS: NE for MAP > 65%. C/w Amiodarone  drip. Start Digoxin  rapid loading  and will decided on Maintenance in the am. Will hold off on diuretics for today.  Lungs: on [place] for O2 sat > 90%.  GI: NG tube secured. Ok to start clear liquid diet and advance as tolerated.  Heme: H&H stable.c/w IV PPI BID. Hold AC given concern for acute bleed.  ID: c/w zosyn  In light of new onset shock. Negative work up so far.  Procalcitonin in the am.    Best Practice (right click and Reselect all SmartList Selections daily)   Diet/type: clear liquids DVT prophylaxis SCD Pressure ulcer(s): N/A GI prophylaxis: PPI Lines: Central line Foley:  N/A Code Status:  full code  Last date of multidisciplinary goals of care discussion [02/29/2024]  Critical care time: 35 minutes.     Darrin Barn, MD [facility] 02/29/2024 8:11 AM

## 2024-02-29 NOTE — Plan of Care (Signed)
  Problem: Clinical Measurements: Goal: Ability to maintain clinical measurements within normal limits will improve Outcome: Progressing Goal: Diagnostic test results will improve Outcome: Progressing Goal: Cardiovascular complication will be avoided Outcome: Progressing   Problem: Clinical Measurements: Goal: Respiratory complications will improve Outcome: Not Progressing

## 2024-02-29 NOTE — Plan of Care (Signed)
  Problem: Elimination: Goal: Will not experience complications related to urinary retention Outcome: Progressing   Problem: Education: Goal: Knowledge of General Education information will improve Description: Including pain rating scale, medication(s)/side effects and non-pharmacologic comfort measures Outcome: Not Progressing   Problem: Health Behavior/Discharge Planning: Goal: Ability to manage health-related needs will improve Outcome: Not Progressing   Problem: Clinical Measurements: Goal: Ability to maintain clinical measurements within normal limits will improve Outcome: Not Progressing Goal: Will remain free from infection Outcome: Not Progressing Goal: Diagnostic test results will improve Outcome: Not Progressing   Problem: Activity: Goal: Risk for activity intolerance will decrease Outcome: Not Progressing   Problem: Nutrition: Goal: Adequate nutrition will be maintained Outcome: Not Progressing

## 2024-03-01 ENCOUNTER — Inpatient Hospital Stay

## 2024-03-01 DIAGNOSIS — I5023 Acute on chronic systolic (congestive) heart failure: Secondary | ICD-10-CM | POA: Diagnosis not present

## 2024-03-01 DIAGNOSIS — R188 Other ascites: Secondary | ICD-10-CM | POA: Diagnosis not present

## 2024-03-01 DIAGNOSIS — Z452 Encounter for adjustment and management of vascular access device: Secondary | ICD-10-CM | POA: Diagnosis not present

## 2024-03-01 DIAGNOSIS — G9341 Metabolic encephalopathy: Secondary | ICD-10-CM | POA: Diagnosis not present

## 2024-03-01 DIAGNOSIS — I469 Cardiac arrest, cause unspecified: Secondary | ICD-10-CM | POA: Diagnosis not present

## 2024-03-01 DIAGNOSIS — B963 Hemophilus influenzae [H. influenzae] as the cause of diseases classified elsewhere: Secondary | ICD-10-CM | POA: Diagnosis not present

## 2024-03-01 DIAGNOSIS — J432 Centrilobular emphysema: Secondary | ICD-10-CM | POA: Diagnosis not present

## 2024-03-01 DIAGNOSIS — R0602 Shortness of breath: Secondary | ICD-10-CM | POA: Diagnosis not present

## 2024-03-01 DIAGNOSIS — Z4682 Encounter for fitting and adjustment of non-vascular catheter: Secondary | ICD-10-CM | POA: Diagnosis not present

## 2024-03-01 DIAGNOSIS — J90 Pleural effusion, not elsewhere classified: Secondary | ICD-10-CM | POA: Diagnosis not present

## 2024-03-01 DIAGNOSIS — I517 Cardiomegaly: Secondary | ICD-10-CM | POA: Diagnosis not present

## 2024-03-01 DIAGNOSIS — R918 Other nonspecific abnormal finding of lung field: Secondary | ICD-10-CM | POA: Diagnosis not present

## 2024-03-01 LAB — BPAM RBC
Blood Product Expiration Date: 202508012359
Blood Product Expiration Date: 202508032359
ISSUE DATE / TIME: 202507040748
ISSUE DATE / TIME: 202507051538
Unit Type and Rh: 7300
Unit Type and Rh: 7300

## 2024-03-01 LAB — CBC
HCT: 24.6 % — ABNORMAL LOW (ref 39.0–52.0)
Hemoglobin: 8 g/dL — ABNORMAL LOW (ref 13.0–17.0)
MCH: 31.3 pg (ref 26.0–34.0)
MCHC: 32.5 g/dL (ref 30.0–36.0)
MCV: 96.1 fL (ref 80.0–100.0)
Platelets: 364 K/uL (ref 150–400)
RBC: 2.56 MIL/uL — ABNORMAL LOW (ref 4.22–5.81)
RDW: 17.4 % — ABNORMAL HIGH (ref 11.5–15.5)
WBC: 18.1 K/uL — ABNORMAL HIGH (ref 4.0–10.5)
nRBC: 2.8 % — ABNORMAL HIGH (ref 0.0–0.2)

## 2024-03-01 LAB — BLOOD GAS, ARTERIAL
Acid-Base Excess: 8 mmol/L — ABNORMAL HIGH (ref 0.0–2.0)
Bicarbonate: 32.8 mmol/L — ABNORMAL HIGH (ref 20.0–28.0)
FIO2: 50 %
MECHVT: 500 mL
Mechanical Rate: 18
O2 Saturation: 99.5 %
PEEP: 8 cmH2O
Patient temperature: 37
pCO2 arterial: 45 mmHg (ref 32–48)
pH, Arterial: 7.47 — ABNORMAL HIGH (ref 7.35–7.45)
pO2, Arterial: 113 mmHg — ABNORMAL HIGH (ref 83–108)

## 2024-03-01 LAB — RENAL FUNCTION PANEL
Albumin: 2.6 g/dL — ABNORMAL LOW (ref 3.5–5.0)
Anion gap: 13 (ref 5–15)
BUN: 56 mg/dL — ABNORMAL HIGH (ref 8–23)
CO2: 29 mmol/L (ref 22–32)
Calcium: 8 mg/dL — ABNORMAL LOW (ref 8.9–10.3)
Chloride: 99 mmol/L (ref 98–111)
Creatinine, Ser: 1.99 mg/dL — ABNORMAL HIGH (ref 0.61–1.24)
GFR, Estimated: 35 mL/min — ABNORMAL LOW (ref >60)
Glucose, Bld: 91 mg/dL (ref 70–99)
Phosphorus: 4.5 mg/dL (ref 2.5–4.6)
Potassium: 4.7 mmol/L (ref 3.5–5.1)
Sodium: 141 mmol/L (ref 135–145)

## 2024-03-01 LAB — GLUCOSE, CAPILLARY
Glucose-Capillary: 97 mg/dL (ref 70–99)
Glucose-Capillary: 88 mg/dL (ref 70–99)
Glucose-Capillary: 101 mg/dL — ABNORMAL HIGH (ref 70–99)
Glucose-Capillary: 97 mg/dL (ref 70–99)
Glucose-Capillary: 102 mg/dL — ABNORMAL HIGH (ref 70–99)

## 2024-03-01 LAB — COOXEMETRY PANEL
Carboxyhemoglobin: 2.3 % — ABNORMAL HIGH (ref 0.5–1.5)
Methemoglobin: <0.7 % (ref 0.0–1.5)
O2 Saturation: 85.9 %
Total hemoglobin: 8.1 g/dL — ABNORMAL LOW (ref 12.0–16.0)
Total oxygen content: 83.9 %

## 2024-03-01 LAB — TYPE AND SCREEN
ABO/RH(D): B POS
Antibody Screen: NEGATIVE
Unit division: 0
Unit division: 0

## 2024-03-01 LAB — PROCALCITONIN: Procalcitonin: 0.4 ng/mL

## 2024-03-01 LAB — MAGNESIUM: Magnesium: 2.6 mg/dL — ABNORMAL HIGH (ref 1.7–2.4)

## 2024-03-01 MED ORDER — ROCURONIUM BROMIDE 10 MG/ML (PF) SYRINGE
PREFILLED_SYRINGE | INTRAVENOUS | Status: AC
  Administered 2024-03-01: 80 mg via INTRAVENOUS
  Filled 2024-03-01: qty 10

## 2024-03-01 MED ORDER — SODIUM CHLORIDE 3 % IN NEBU
4.0000 mL | INHALATION_SOLUTION | Freq: Two times a day (BID) | RESPIRATORY_TRACT | Status: AC
  Administered 2024-03-01 – 2024-03-03 (×5): 4 mL via RESPIRATORY_TRACT
  Filled 2024-03-01 (×7): qty 4

## 2024-03-01 MED ORDER — ROCURONIUM BROMIDE 10 MG/ML (PF) SYRINGE: 80.0000 mg | PREFILLED_SYRINGE | Freq: Once | INTRAVENOUS | Status: AC

## 2024-03-01 MED ORDER — ALTEPLASE 2 MG IJ SOLR
2.0000 mg | Freq: Once | INTRAMUSCULAR | Status: AC
  Administered 2024-03-01: 2 mg
  Filled 2024-03-01: qty 2

## 2024-03-01 MED ORDER — AMIODARONE HCL 200 MG PO TABS
400.0000 mg | ORAL_TABLET | Freq: Two times a day (BID) | ORAL | Status: AC
  Administered 2024-03-01 – 2024-03-03 (×5): 400 mg
  Filled 2024-03-01 (×4): qty 2

## 2024-03-01 MED ORDER — VITAL HP 1.0 CAL PO LIQD
1000.0000 mL | ORAL | Status: DC
  Administered 2024-03-01 (×2): 1000 mL

## 2024-03-01 MED ORDER — ORAL CARE MOUTH RINSE
15.0000 mL | OROMUCOSAL | Status: DC
  Administered 2024-03-01 – 2024-03-12 (×139): 15 mL via OROMUCOSAL

## 2024-03-01 MED ORDER — DIGOXIN 125 MCG PO TABS
0.1250 mg | ORAL_TABLET | Freq: Every day | ORAL | Status: DC
  Administered 2024-03-01 – 2024-03-12 (×9): 0.125 mg via NASOGASTRIC
  Filled 2024-03-01 (×12): qty 1

## 2024-03-01 MED ORDER — KETAMINE HCL 50 MG/5ML IJ SOSY
0.5000 mg/kg | PREFILLED_SYRINGE | Freq: Once | INTRAMUSCULAR | Status: AC
  Administered 2024-03-01: 45 mg via INTRAVENOUS
  Filled 2024-03-01: qty 5

## 2024-03-01 MED ORDER — AMIODARONE HCL 200 MG PO TABS
400.0000 mg | ORAL_TABLET | Freq: Two times a day (BID) | ORAL | Status: DC
  Filled 2024-03-01: qty 2

## 2024-03-01 MED ORDER — ORAL CARE MOUTH RINSE
15.0000 mL | OROMUCOSAL | Status: DC | PRN
Start: 2024-03-01 — End: 2024-03-12

## 2024-03-01 NOTE — IPAL (Signed)
 I discussed Mr. Eguia course with his brother Cherene which includes an MI leading to cardiac arrest, biventricular failure. He self extubated and was able to protect his airway for two days but unfortunately course complicated by acute anemia without any evidence of bleeding requring 2 transfusions. However unfortunately, his course was complicated by respiratory failure overnight and required reintubation.   We discussed goals of care and acknowledged that it is a very difficult decision given that this was not discussed prior with Mr. Woolverton. At this point Cherene relayed that as long as there is a chance even if minimal than he would like to try everything and that includes chest compressions. He acknowledged that his brother might not recover fully from this and might need 24/7 assistance and are willing to sacrifice time to take care of his brother as a family.   For now we decided to continue Full code status and reconvene in 48 hours to decide on next steps.   Darrin Barn, MD [facility] 03/01/2024 12:05 PM

## 2024-03-01 NOTE — Plan of Care (Signed)
  Problem: Education: Goal: Knowledge of General Education information will improve Description: Including pain rating scale, medication(s)/side effects and non-pharmacologic comfort measures Outcome: Not Progressing   Problem: Health Behavior/Discharge Planning: Goal: Ability to manage health-related needs will improve Outcome: Not Progressing   Problem: Clinical Measurements: Goal: Ability to maintain clinical measurements within normal limits will improve Outcome: Not Progressing Goal: Will remain free from infection Outcome: Not Progressing Goal: Diagnostic test results will improve Outcome: Not Progressing Goal: Respiratory complications will improve Outcome: Not Progressing Goal: Cardiovascular complication will be avoided Outcome: Not Progressing   Problem: Activity: Goal: Risk for activity intolerance will decrease Outcome: Not Progressing   Problem: Nutrition: Goal: Adequate nutrition will be maintained Outcome: Not Progressing   Problem: Coping: Goal: Level of anxiety will decrease Outcome: Not Progressing   Problem: Elimination: Goal: Will not experience complications related to bowel motility Outcome: Not Progressing Goal: Will not experience complications related to urinary retention Outcome: Not Progressing   Problem: Pain Managment: Goal: General experience of comfort will improve and/or be controlled Outcome: Not Progressing   Problem: Safety: Goal: Ability to remain free from injury will improve Outcome: Not Progressing   Problem: Skin Integrity: Goal: Risk for impaired skin integrity will decrease Outcome: Not Progressing   Problem: Education: Goal: Ability to manage disease process will improve Outcome: Not Progressing   Problem: Cardiac: Goal: Ability to achieve and maintain adequate cardiopulmonary perfusion will improve Outcome: Not Progressing   Problem: Neurologic: Goal: Promote progressive neurologic recovery Outcome: Not  Progressing   Problem: Skin Integrity: Goal: Risk for impaired skin integrity will be minimized. Outcome: Not Progressing

## 2024-03-01 NOTE — Progress Notes (Signed)
 SLP Cancellation Note  Patient Details Name: Miguel [name] MRN: [id number] DOB: [birth year]   Cancelled treatment:       Reason Eval/Treat Not Completed: Medical issues which prohibited therapy;Patient not medically ready (chart reviewed; went by room.)   Pt is now orally intubated d/t Pulmonary decline worsening hypoxic Respiratory Failure and increased work of breathing.   ST services will sign off at this time w/ MD to reconsult when pt's medically appropriate for oral intake/BSE.     Comer Portugal, MS, CCC-SLP Speech Language Pathologist Rehab Services; [facility] [phone] (ascom) Jacqueleen Pulver 03/01/2024, 11:18 AM

## 2024-03-01 NOTE — Procedures (Addendum)
 INTUBATION PROCEDURE NOTE  Miguel [name]  [id number]  03-25-1951  Date:03/01/24  Time:2:47 AM   Provider Performing:Miguel [name]   Procedure: Intubation (31500)  Indication(s) Patient noted with worsening hypoxic Respiratory Failure and increased work of breathing.  Consent Unable to obtain consent due to emergent nature of procedure.  Anesthesia Rocuronium  and Ketamine   Time Out Verified patient identification, verified procedure, site/side was marked, verified correct patient position, special equipment/implants available, medications/allergies/relevant history reviewed, required imaging and test results available.  Sterile Technique Usual hand hygeine, masks, and gloves were used  Procedure Description Patient positioned in bed supine.  Sedation given as noted above.  Patient was intubated with endotracheal tube using Glidescope.  View was Grade 1 full glottis .  Number of attempts was 1.  Colorimetric CO2 detector was consistent with tracheal placement.  Complications/Tolerance None; patient tolerated the procedure well. Chest X-ray is ordered to verify placement.  EBL Minimal  Specimen(s) None   Miguel Nose, DNP, CCRN, FNP-C, AGACNP-BC Acute Care & Family Nurse Practitioner  [facility]  See Amion for personal pager PCCM on call pager [phone] until 7 am

## 2024-03-01 NOTE — Plan of Care (Signed)
  Problem: Nutrition: Goal: Adequate nutrition will be maintained Outcome: Progressing   Problem: Coping: Goal: Level of anxiety will decrease Outcome: Progressing   Problem: Elimination: Goal: Will not experience complications related to bowel motility Outcome: Progressing Goal: Will not experience complications related to urinary retention Outcome: Progressing   Problem: Pain Managment: Goal: General experience of comfort will improve and/or be controlled Outcome: Progressing   Problem: Safety: Goal: Ability to remain free from injury will improve Outcome: Progressing   Problem: Education: Goal: Knowledge of General Education information will improve Description: Including pain rating scale, medication(s)/side effects and non-pharmacologic comfort measures Outcome: Not Progressing   Problem: Health Behavior/Discharge Planning: Goal: Ability to manage health-related needs will improve Outcome: Not Progressing   Problem: Clinical Measurements: Goal: Ability to maintain clinical measurements within normal limits will improve Outcome: Not Progressing   Problem: Neurologic: Goal: Promote progressive neurologic recovery Outcome: Not Progressing

## 2024-03-01 NOTE — Progress Notes (Signed)
 NAME:  Miguel [name], MRN:  [id number], DOB:  [birth year], LOS: 10 ADMISSION DATE:  02/20/2024  History of Present Illness:  This is a 73 yo male who presented to [facility] ER via EMS from jail on 06/26 post cardiac arrest.  Per ER notes pts cellmate heard the pt gurgling this morning.  He was found pulseless with initial cardiac rhythm asystole.  EMS administered 3 doses of epi/2 mg of narcan .  Following administration of narcan  ROSC achieved.  Estimated downtime 15 minutes.  EMS reported en route to the ER pt became agitated and attempted to remove Igel.  EMS administered versed .  Pt became pulseless again en route ACLS protocol initiated.   ED Course  Upon arrival to the ER pt remained pulseless in PEA.  ACLS protocol continued pt received: 2 mg of epi/4 mg of iv narcan /1 amp of bicarb/1 g of calcium .  Igel exchanged for an ETT with ongoing CPR (pt did not require RSI medications for intubation).  ROSC achieved with estimated downtime during 2nd cardiac arrest 15 minutes.  CTA Chest negative for PE.  Significant lab results were: Na+ 129/K+ 5.3/chloride 90/glucose 110/BUN 49/creatinine 2.22/calcium  7.5/mag 3.2/alk phos 134/AST 3,301/ALT 2,038/total bilirubin 5.5/troponin 344/lactic acid 8.1/urine drug screen negative/UA concerning for possible UTI.  Sepsis protocol initiated pt to receive: cefepime /metronidazole /vancomycin .  Pt found to be severely hypoglycemic CBG 17 requiring 1 amp of D50W.  Levophed  gtt initiated due to hypotension.  PCCM team contacted for ICU admission.  Pt remains in police custody with bilateral ankle cuffs in place, and police officer at bedside.    PCCM asked to admit for further workup and treatment.   Please see Significant Hospital Events section below for full detailed hospital course.  Pertinent  Medical History  Schizophrenia  Depression Current Smoker  Chronic Systolic CHF (Echo 09/11/22: EF <20%, grade II diastolic dysfunction, trivial pericardial effusion, mild  mitral valve regurgitation, trivial tricuspid valve regurgitation, trivial pulmonic valve regurgitation) CVA with aphasia  Significant Hospital Events: Including procedures, antibiotic start and stop dates in addition to other pertinent events    06/26: Pt admitted post cardiac arrest mechanically intubated with possible cardiogenic/possible septic shock with multiorgan failure requiring levophed  and dobutamine  gtts 06/27: on minimal sedation, discontinued this AM. Off nor-epi, continued on dobutamine  06/28: decreased dobutamine  to 2.5, remains off sedation 06/29: arouses to tactile and verbal stimuli, remains disoriented. Failed SBT this AM. Dobutamine  increased to 5 mcg overnight due to bradycardia 06/30: Pt remains mechanically intubated, however neuro exam precludes extubation.  He has not received sedation since 06/27. Dobutamine  discontinued  06/30: MRI Brain Since the previous MRI, the patient has developed chronic encephalomalacia changes within the right medial occipital lobe compatible with an interval PCA distribution infarct. Interval development of encephalomalacia in area of previously demonstrated left MCA distribution infarct, which was acute on the previous MRI. 07/01: Overnight pt remained in atrial flutter with rvr hr 140 to 150's with possible ST elevation despite iv metoprolol .  Cardiologist Dr Darron notified recommended amiodarone  bolus followed by amiodarone  gtt for rate control.  However, this morning while on amiodarone  gtt pt became severely bradycardic hr as low as 27 bpm with associated hypotension, amiodarone  gtt discontinued he is now requiring low dose levophed  gtt.  Self extubated.  Delirious requiring Precedex . 07/02: No significant events noted overnight.  Remains on Precedex  due to delirium, currently protecting his airway.  Afebrile, hemodynamically stable, Levophed  weaned off, Bradycardia resolved, no reports of tachyarrhythmias.  Dobutamine  weaned to 2.5 and  Coox  remains stable at 83, will d/c Dobutamine .  Speech/PT/OT consulted.  Failed speech evaluation, plan to place Dobhoff as able, remove foley. 07/03: No significant events noted overnight. Remains on low dose precedex  (0.4 mcg) due to delirium.  Afebrile, hemodynamically stable, no vasopressors.  Coox 73 and Lactic 1.1 OFF of inotropes and OFF Levophed .  Dobhoff with difficultly functioning, will exchange it to a regular NGT for feeds and med administration.  Gentle diuresis with 20 mg IV Lasix  x1 dose.  Hgb decreased to 9 from 11.7, no signs of bleeding, will d/c Heparin  gtt given no thrombus on repeat Echo. 07/04: Overnight Levophed  reinitiated, Hgb dropped to 6.4 (no reports of bleeding from nursing, abdominal exam is benign), ordered for 1 unit pRBC's, Lovenox  d/c,  increase PPI to BID dosing.  With worsening Leukocytosis up to 15.5 from 12.8 and increased FiO2 requirements to up 10 L (overnight deep Nasotracheal suctioning yielded copious amounts of thick secretions) , will check PCT and start empiric Zosyn  for aspiration coverage.  WOB is improved from yesterday, on low dose Precedex  but more interactive today.  Again unable to complete speech evaluation, will attempt NG tube placement again. 07/05: Remains confused and delirious. HR in the 140s; unresponsive to amiodarone  bolus and infusion. Transfused 1 unit of PRBCs for Hg of 7.1. Started on valium  for agitation. 07/06: Intubated overnight due to worsening mental status and thick copious secretions.   Interim History / Subjective:  Declined overnight and was intubated.  Hg 8/0 post transfusion of 1 unit of PRBCs. Afebrile but WBC trending up (13.8>18.1 and copious secretions. Had a BM last night. Abdomen is less distended. Gets agitated with minimal arousal. Troponins increased to 4321 but now trending down. Now on SBT trial. HR still fluctuating with highs in the 140s. Objective    Blood pressure 92/76, pulse (!) 49, temperature 98.7 F (37.1 C),  temperature source Oral, resp. rate 18, height 6' (1.829 m), weight 94.5 kg, SpO2 93%. CVP:  [12 mmHg-65 mmHg] 12 mmHg  Vent Mode: PRVC FiO2 (%):  [40 %-60 %] 40 % Set Rate:  [18 bmp] 18 bmp Vt Set:  [500 mL] 500 mL PEEP:  [5 cmH20-8 cmH20] 5 cmH20 Plateau Pressure:  [19 cmH20-21 cmH20] 19 cmH20   Intake/Output Summary (Last 24 hours) at 03/01/2024 0931 Last data filed at 03/01/2024 0930 Gross per 24 hour  Intake 2884.2 ml  Output 1560 ml  Net 1324.2 ml   Filed Weights   02/28/24 0500 02/29/24 0500 03/01/24 0448  Weight: 93.4 kg 90.6 kg 94.5 kg    Examination: Constitutional: Appears acutely ill, intubated and mildly sedated HEENT:  [place]/AT, PERRLA, trachea midline, orla mucosa with secretions, no JVD Cardiovascular: Sinus tachycardia, S1/S2, no MRG Pulmonary/Chest: Bilateral lung expansion, no vent dyssynchrony, bilateral breath sounds with diffuse rhonchi; no wheezes; breath sound diminished in the bases. Abdominal: mildly distended, hypoactive bowel sounds, no organomegaly on palpation Musculoskeletal: +rom, no deformities Neurological: Awakens and withdraws to noxious stimulus; limited due to mental status Extremities: hyperpigmented with faint pulses Skin: sternal wound with intact dressing Psychiatric:: unable to assess     Latest Ref Rng & Units 03/01/2024    4:55 AM 02/29/2024    2:33 PM 02/29/2024    5:00 AM  CBC  WBC 4.0 - 10.5 K/uL 18.1  13.8  13.1   Hemoglobin 13.0 - 17.0 g/dL 8.0  7.1  7.5   Hematocrit 39.0 - 52.0 % 24.6  21.8  21.8   Platelets 150 -  400 K/uL 364  329  346        Latest Ref Rng & Units 03/01/2024    4:55 AM 02/29/2024    5:00 AM 02/28/2024    3:06 AM  CMP  Glucose 70 - 99 mg/dL 91  888  870   BUN 8 - 23 mg/dL 56  56  54   Creatinine 0.61 - 1.24 mg/dL 8.00  8.22  8.35   Sodium 135 - 145 mmol/L 141  144  145   Potassium 3.5 - 5.1 mmol/L 4.7  4.7  4.6   Chloride 98 - 111 mmol/L 99  102  104   CO2 22 - 32 mmol/L 29  32  32   Calcium  8.9 - 10.3 mg/dL  8.0  8.1  8.1   Total Protein 6.5 - 8.1 g/dL  5.9  5.8   Total Bilirubin 0.0 - 1.2 mg/dL  1.4  1.2   Alkaline Phos 38 - 126 U/L  62  62   AST 15 - 41 U/L  74  76   ALT 0 - 44 U/L  221  251      ASSESSMENT / PLAN:  PULMONARY A: Acute hypoxic respiratory failure requiring reintubation Concern for aspiration due to AMS Copious tracheal secretions P:   -Full vent support with current settings - Aggressive pulmonary toileting - Scheduled and as needed bronchodilators - Empiric antibiotics as ordered - Sputum cultures - SBT trials as tolerated - Keep SPO2 85 to 94% CARDIOVASCULAR A:  Out of hospital cardiac arrest s/p intubation and MV 06/26 s/p extubation 07/01. 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  BiV faiure with Echocardiogram with new reduced EF 20% w/ regional wall motion abnormalities noted. RV systolic function severely reduced.  New onset A.flutter  Mixed shock - cardiogenic and possibly distributive from recurrent sepsis  P:  -Unable to anticoagulate due acute blood loss anemia -Continue amiodarone  infusion -Hemodynamic monitoring per ICU protocol - Pressors as needed to keep mean arterial blood pressure 65 and above -Continue digoxin  for rate control RENAL A:   AKI with Cr. 1.99 mg/dl from a baseline of 0.9 mg/dl.  P:   -Trend renal indices - Avoid nephrotoxic drugs  GASTROINTESTINAL A:   Questionable GI blood loss-patient's hemoglobin dropped to 7.1 requiring transfusion of 1 unit of packed red blood cells.  Hemoglobin is back to 8.0 this morning.   P:   -Continue to trend hemoglobin and transfuse as needed - Will consider reconsulting GI if hemoglobin continues to drop or if patient has overt hematemesis or hematochezia -STAT trickle tube feeds with water  flushes today and monitor for tolerance  HEMATOLOGIC A:   Acute blood loss anemia-hg dropped to 7.1; now 8.0 post transfusion; likely source GI P:  -Monitor and transfuse if hemoglobin drops below 7  INFECTIOUS A:    Persistent leukocytosis with recurrent sepsis-most likely source is respiratory given possible infiltrates on chest x-ray and copious tracheal secretions P:   -F/U Sputum culture -Trend CBC and monitor fever curse -Empiric coverage with zosyn  -Procalcitonin level in the morning ENDOCRINE A:   Mild hyperglycemia without a diagnosis of diabetes P:   -Point-of-care glucose monitoring without coverage.  Labs glucose level was 88 mg/dL.  Monitor closely for signs of hypoglycemia  NEUROLOGIC A:   Severe Encephalopathy-Contributing factors include ICU delirium, underlying severe mental illness and sepsis History of crack and cocaine use  P:   RASS goal: 0-1 -Precedex , Seroquel  and Valium  for agitation and and delirium -PRNs Fentanyl   for vent discomfort -Monitor mental status closely and use mittens for safety as needed  Last date of multidisciplinary goals of care discussion [03/01/2024]  Disposition and Family update: Plan is to have an extensive conversation with patient's family today regarding patient's prognosis.  He has multiple underlying comorbidities that would preclude a full full and meaningful recovery.  His current lengthy ICU stay following his cardiac arrest as well as the possibility of hypoxic encephalopathy following downtime during cardiac arrest has significantly impacted his mental status thereby impeding his ability to maintain his airway, control his secretions, tolerate oral intake and ADL ability.  He remains a full code. Best Practice: Code Status: Full code Diet: N.p.o. GI prophylaxis: Protonix  Foley: Reinsert Foley catheter for strict I's and O's monitoring CVC: Left IJ still needed NG: Still needed VTE prophylaxis:  SCD's / patient is not eligible for pharmacologic VTE prophylaxis due acute blood loss anemia  Sherah Lund S. Tukov ANP-BC Pulmonary and Critical Care Medicine [facility] Pager [phone]       Critical care time: 45 minutes.      Melita GORMAN Puls, MD [facility] 03/01/2024 9:31 AM

## 2024-03-01 NOTE — Plan of Care (Signed)
 Secure message received from Magdalene Tukov-Yual, NP with CCM to readdress goals of care with family due to [name] requiring reintubation earlier this AM due to worsening respiratory failure.  Dr. Malka held goals of care conversations independently with brother at bedside. [name] remains Full Code.  PMT will continue to follow for ongoing needs and support.  No Charge.  Miguel Lesches, DNP, AGNP-C Palliative Medicine  Please call Palliative Medicine team phone with any questions [phone]. For individual providers please see AMION.

## 2024-03-02 ENCOUNTER — Inpatient Hospital Stay

## 2024-03-02 DIAGNOSIS — J9601 Acute respiratory failure with hypoxia: Secondary | ICD-10-CM | POA: Diagnosis not present

## 2024-03-02 DIAGNOSIS — Z01810 Encounter for preprocedural cardiovascular examination: Secondary | ICD-10-CM

## 2024-03-02 DIAGNOSIS — I469 Cardiac arrest, cause unspecified: Secondary | ICD-10-CM | POA: Diagnosis not present

## 2024-03-02 DIAGNOSIS — J9602 Acute respiratory failure with hypercapnia: Secondary | ICD-10-CM

## 2024-03-02 DIAGNOSIS — R7989 Other specified abnormal findings of blood chemistry: Secondary | ICD-10-CM | POA: Diagnosis not present

## 2024-03-02 DIAGNOSIS — I700 Atherosclerosis of aorta: Secondary | ICD-10-CM | POA: Diagnosis not present

## 2024-03-02 DIAGNOSIS — I639 Cerebral infarction, unspecified: Secondary | ICD-10-CM | POA: Diagnosis not present

## 2024-03-02 DIAGNOSIS — R601 Generalized edema: Secondary | ICD-10-CM | POA: Diagnosis not present

## 2024-03-02 DIAGNOSIS — G9341 Metabolic encephalopathy: Secondary | ICD-10-CM | POA: Diagnosis not present

## 2024-03-02 DIAGNOSIS — N179 Acute kidney failure, unspecified: Secondary | ICD-10-CM | POA: Diagnosis not present

## 2024-03-02 LAB — PROTIME-INR
INR: 1.4 — ABNORMAL HIGH (ref 0.8–1.2)
Prothrombin Time: 18.3 s — ABNORMAL HIGH (ref 11.4–15.2)

## 2024-03-02 LAB — RENAL FUNCTION PANEL
Albumin: 2.2 g/dL — ABNORMAL LOW (ref 3.5–5.0)
Anion gap: 8 (ref 5–15)
BUN: 48 mg/dL — ABNORMAL HIGH (ref 8–23)
CO2: 30 mmol/L (ref 22–32)
Calcium: 7.9 mg/dL — ABNORMAL LOW (ref 8.9–10.3)
Chloride: 104 mmol/L (ref 98–111)
Creatinine, Ser: 1.78 mg/dL — ABNORMAL HIGH (ref 0.61–1.24)
GFR, Estimated: 40 mL/min — ABNORMAL LOW (ref >60)
Glucose, Bld: 117 mg/dL — ABNORMAL HIGH (ref 70–99)
Phosphorus: 2.5 mg/dL (ref 2.5–4.6)
Potassium: 3.5 mmol/L (ref 3.5–5.1)
Sodium: 142 mmol/L (ref 135–145)

## 2024-03-02 LAB — GLUCOSE, CAPILLARY
Glucose-Capillary: 106 mg/dL — ABNORMAL HIGH (ref 70–99)
Glucose-Capillary: 120 mg/dL — ABNORMAL HIGH (ref 70–99)
Glucose-Capillary: 213 mg/dL — ABNORMAL HIGH (ref 70–99)
Glucose-Capillary: 49 mg/dL — ABNORMAL LOW (ref 70–99)
Glucose-Capillary: 80 mg/dL (ref 70–99)
Glucose-Capillary: 81 mg/dL (ref 70–99)
Glucose-Capillary: 94 mg/dL (ref 70–99)
Glucose-Capillary: 97 mg/dL (ref 70–99)
Glucose-Capillary: 99 mg/dL (ref 70–99)

## 2024-03-02 LAB — CBC
HCT: 24.8 % — ABNORMAL LOW (ref 39.0–52.0)
Hemoglobin: 8 g/dL — ABNORMAL LOW (ref 13.0–17.0)
MCH: 31.1 pg (ref 26.0–34.0)
MCHC: 32.3 g/dL (ref 30.0–36.0)
MCV: 96.5 fL (ref 80.0–100.0)
Platelets: 372 K/uL (ref 150–400)
RBC: 2.57 MIL/uL — ABNORMAL LOW (ref 4.22–5.81)
RDW: 17.3 % — ABNORMAL HIGH (ref 11.5–15.5)
WBC: 16.7 K/uL — ABNORMAL HIGH (ref 4.0–10.5)
nRBC: 2 % — ABNORMAL HIGH (ref 0.0–0.2)

## 2024-03-02 LAB — PROCALCITONIN: Procalcitonin: 0.37 ng/mL

## 2024-03-02 LAB — MAGNESIUM: Magnesium: 2.5 mg/dL — ABNORMAL HIGH (ref 1.7–2.4)

## 2024-03-02 MED ORDER — CEFAZOLIN SODIUM-DEXTROSE 2-4 GM/100ML-% IV SOLN: 2.0000 g | INTRAVENOUS | Status: AC

## 2024-03-02 MED ORDER — PROSOURCE TF20 ENFIT COMPATIBL EN LIQD
60.0000 mL | Freq: Two times a day (BID) | ENTERAL | Status: DC
  Administered 2024-03-02 – 2024-03-12 (×20): 60 mL
  Filled 2024-03-02 (×18): qty 60

## 2024-03-02 MED ORDER — POTASSIUM CHLORIDE 10 MEQ/100ML IV SOLN
10.0000 meq | INTRAVENOUS | Status: AC
  Administered 2024-03-02 (×2): 10 meq via INTRAVENOUS
  Filled 2024-03-02 (×2): qty 100

## 2024-03-02 MED ORDER — PROPOFOL 1000 MG/100ML IV EMUL
0.0000 ug/kg/min | INTRAVENOUS | Status: DC
  Administered 2024-03-02: 20 ug/kg/min via INTRAVENOUS
  Administered 2024-03-02: 10 ug/kg/min via INTRAVENOUS
  Administered 2024-03-02 – 2024-03-03 (×4): 20 ug/kg/min via INTRAVENOUS
  Administered 2024-03-04: 15 ug/kg/min via INTRAVENOUS
  Administered 2024-03-04: 20 ug/kg/min via INTRAVENOUS
  Administered 2024-03-05: 15 ug/kg/min via INTRAVENOUS
  Filled 2024-03-02 (×8): qty 100

## 2024-03-02 MED ORDER — DEXTROSE 50 % IV SOLN
25.0000 g | INTRAVENOUS | Status: AC
Start: 2024-03-02 — End: 2024-03-02
  Administered 2024-03-02: 25 g via INTRAVENOUS

## 2024-03-02 MED ORDER — ARIPIPRAZOLE 15 MG PO TABS
30.0000 mg | ORAL_TABLET | Freq: Every day | ORAL | Status: DC
  Administered 2024-03-02 – 2024-03-11 (×10): 30 mg
  Filled 2024-03-02 (×11): qty 2

## 2024-03-02 MED ORDER — DIAZEPAM 5 MG PO TABS
2.5000 mg | ORAL_TABLET | Freq: Four times a day (QID) | ORAL | Status: DC
  Administered 2024-03-02 – 2024-03-06 (×15): 2.5 mg
  Filled 2024-03-02 (×15): qty 1

## 2024-03-02 MED ORDER — PROPOFOL 1000 MG/100ML IV EMUL
INTRAVENOUS | Status: AC
  Filled 2024-03-02: qty 100

## 2024-03-02 MED ORDER — FENTANYL CITRATE PF 50 MCG/ML IJ SOSY
50.0000 ug | PREFILLED_SYRINGE | INTRAMUSCULAR | Status: DC | PRN
  Administered 2024-03-02 – 2024-03-04 (×3): 50 ug via INTRAVENOUS
  Filled 2024-03-02 (×5): qty 1

## 2024-03-02 MED ORDER — VITAL 1.5 CAL PO LIQD
1000.0000 mL | ORAL | Status: DC
  Administered 2024-03-02 – 2024-03-03 (×2): 1000 mL

## 2024-03-02 MED ORDER — OXYCODONE HCL 5 MG PO TABS
5.0000 mg | ORAL_TABLET | Freq: Two times a day (BID) | ORAL | Status: DC
  Administered 2024-03-02 – 2024-03-05 (×7): 5 mg
  Filled 2024-03-02 (×7): qty 1

## 2024-03-02 MED ORDER — FENTANYL CITRATE PF 50 MCG/ML IJ SOSY
50.0000 ug | PREFILLED_SYRINGE | Freq: Once | INTRAMUSCULAR | Status: AC
  Administered 2024-03-02: 50 ug via INTRAVENOUS

## 2024-03-02 MED ORDER — DEXTROSE 50 % IV SOLN
INTRAVENOUS | Status: AC
  Administered 2024-03-02: 50 mL
  Filled 2024-03-02: qty 50

## 2024-03-02 NOTE — Progress Notes (Signed)
 NAME:  Miguel [name], MRN:  [id number], DOB:  01-20-1951, LOS: 11 ADMISSION DATE:  02/20/2024  History of Present Illness:  73 yo male who presented to [facility] ER via EMS from jail on 06/26 post cardiac arrest.  Per ER notes pts cellmate heard the pt gurgling this morning.  He was found pulseless with initial cardiac rhythm asystole.  EMS administered 3 doses of epi/2 mg of narcan .  Following administration of narcan  ROSC achieved.  Estimated downtime 15 minutes.  EMS reported en route to the ER pt became agitated and attempted to remove Igel.  EMS administered versed .  Pt became pulseless again en route ACLS protocol initiated.   ED Course  Upon arrival to the ER pt remained pulseless in PEA.  ACLS protocol continued pt received: 2 mg of epi/4 mg of iv narcan /1 amp of bicarb/1 g of calcium .  Igel exchanged for an ETT with ongoing CPR (pt did not require RSI medications for intubation).  ROSC achieved with estimated downtime during 2nd cardiac arrest 15 minutes.  CTA Chest negative for PE.  Significant lab results were: Na+ 129/K+ 5.3/chloride 90/glucose 110/BUN 49/creatinine 2.22/calcium  7.5/mag 3.2/alk phos 134/AST 3,301/ALT 2,038/total bilirubin 5.5/troponin 344/lactic acid 8.1/urine drug screen negative/UA concerning for possible UTI.  Sepsis protocol initiated pt to receive: cefepime /metronidazole /vancomycin .  Pt found to be severely hypoglycemic CBG 17 requiring 1 amp of D50W.  Levophed  gtt initiated due to hypotension.  PCCM team contacted for ICU admission.  Pt remains in police custody with bilateral ankle cuffs in place, and police officer at bedside.    PCCM asked to admit for further workup and treatment.   Please see Significant Hospital Events section below for full detailed hospital course.  Pertinent  Medical History  Schizophrenia  Depression Current Smoker  Chronic Systolic CHF (Echo 09/11/22: EF <20%, grade II diastolic dysfunction, trivial pericardial effusion, mild mitral  valve regurgitation, trivial tricuspid valve regurgitation, trivial pulmonic valve regurgitation) CVA with aphasia  Significant Hospital Events: Including procedures, antibiotic start and stop dates in addition to other pertinent events    06/26: Pt admitted post cardiac arrest mechanically intubated with possible cardiogenic/possible septic shock with multiorgan failure requiring levophed  and dobutamine  gtts 06/27: on minimal sedation, discontinued this AM. Off nor-epi, continued on dobutamine  06/28: decreased dobutamine  to 2.5, remains off sedation 06/29: arouses to tactile and verbal stimuli, remains disoriented. Failed SBT this AM. Dobutamine  increased to 5 mcg overnight due to bradycardia 06/30: Pt remains mechanically intubated, however neuro exam precludes extubation.  He has not received sedation since 06/27. Dobutamine  discontinued  06/30: MRI Brain Since the previous MRI, the patient has developed chronic encephalomalacia changes within the right medial occipital lobe compatible with an interval PCA distribution infarct. Interval development of encephalomalacia in area of previously demonstrated left MCA distribution infarct, which was acute on the previous MRI. 07/01: Overnight pt remained in atrial flutter with rvr hr 140 to 150's with possible ST elevation despite iv metoprolol .  Cardiologist Dr Darron notified recommended amiodarone  bolus followed by amiodarone  gtt for rate control.  However, this morning while on amiodarone  gtt pt became severely bradycardic hr as low as 27 bpm with associated hypotension, amiodarone  gtt discontinued he is now requiring low dose levophed  gtt.  Self extubated.  Delirious requiring Precedex . 07/02: No significant events noted overnight.  Remains on Precedex  due to delirium, currently protecting his airway.  Afebrile, hemodynamically stable, Levophed  weaned off, Bradycardia resolved, no reports of tachyarrhythmias.  Dobutamine  weaned to 2.5 and Coox remains  stable at 83, will d/c Dobutamine .  Speech/PT/OT consulted.  Failed speech evaluation, plan to place Dobhoff as able, remove foley. 07/03: No significant events noted overnight. Remains on low dose precedex  (0.4 mcg) due to delirium.  Afebrile, hemodynamically stable, no vasopressors.  Coox 73 and Lactic 1.1 OFF of inotropes and OFF Levophed .  Dobhoff with difficultly functioning, will exchange it to a regular NGT for feeds and med administration.  Gentle diuresis with 20 mg IV Lasix  x1 dose.  Hgb decreased to 9 from 11.7, no signs of bleeding, will d/c Heparin  gtt given no thrombus on repeat Echo. 07/04: Overnight Levophed  reinitiated, Hgb dropped to 6.4 (no reports of bleeding from nursing, abdominal exam is benign), ordered for 1 unit pRBC's, Lovenox  d/c,  increase PPI to BID dosing.  With worsening Leukocytosis up to 15.5 from 12.8 and increased FiO2 requirements to up 10 L (overnight deep Nasotracheal suctioning yielded copious amounts of thick secretions) , will check PCT and start empiric Zosyn  for aspiration coverage.  WOB is improved from yesterday, on low dose Precedex  but more interactive today.  Again unable to complete speech evaluation, will attempt NG tube placement again. 07/05: Remains confused and delirious. HR in the 140s; unresponsive to amiodarone  bolus and infusion. Transfused 1 unit of PRBCs for Hg of 7.1. Started on valium  for agitation. 07/06: Intubated overnight due to worsening mental status and thick copious secretions.   Interim History / Subjective:  Remains critically ill Remains intubated Requires VENT support for survival Remains delirious   Vent Mode: PRVC FiO2 (%):  [40 %] 40 % Set Rate:  [18 bmp] 18 bmp Vt Set:  [450 mL-500 mL] 500 mL PEEP:  [5 cmH20] 5 cmH20 Plateau Pressure:  [17 cmH20-24 cmH20] 17 cmH20  Objective    Blood pressure (!) 101/54, pulse (!) 53, temperature 98.2 F (36.8 C), temperature source Axillary, resp. rate 18, height 6' (1.829 m),  weight 94.5 kg, SpO2 97%.    Vent Mode: PRVC FiO2 (%):  [40 %] 40 % Set Rate:  [18 bmp] 18 bmp Vt Set:  [450 mL-500 mL] 450 mL PEEP:  [5 cmH20] 5 cmH20 Plateau Pressure:  [18 cmH20-24 cmH20] 18 cmH20   Intake/Output Summary (Last 24 hours) at 03/02/2024 0747 Last data filed at 03/02/2024 0600 Gross per 24 hour  Intake 1780.24 ml  Output 1560 ml  Net 220.24 ml   Filed Weights   02/28/24 0500 02/29/24 0500 03/01/24 0448  Weight: 93.4 kg 90.6 kg 94.5 kg     REVIEW OF SYSTEMS  PATIENT IS UNABLE TO PROVIDE COMPLETE REVIEW OF SYSTEMS DUE TO SEVERE CRITICAL ILLNESS   PHYSICAL EXAMINATION:  GENERAL:critically ill appearing, +resp distress EYES: Pupils equal, round, reactive to light.  No scleral icterus.  MOUTH: Moist mucosal membrane. INTUBATED NECK: Supple.  PULMONARY: Lungs clear to auscultation, +rhonchi, +wheezing CARDIOVASCULAR: S1 and S2.  Regular rate and rhythm GASTROINTESTINAL: Soft, nontender, -distended. Positive bowel sounds.  MUSCULOSKELETAL: No swelling, clubbing, or edema.  NEUROLOGIC: obtunded,sedated SKIN:normal, warm to touch, Capillary refill delayed  Pulses present bilaterally       Latest Ref Rng & Units 03/02/2024    3:42 AM 03/01/2024    4:55 AM 02/29/2024    2:33 PM  CBC  WBC 4.0 - 10.5 K/uL 16.7  18.1  13.8   Hemoglobin 13.0 - 17.0 g/dL 8.0  8.0  7.1   Hematocrit 39.0 - 52.0 % 24.8  24.6  21.8   Platelets 150 - 400 K/uL 372  364  329        Latest Ref Rng & Units 03/02/2024    3:42 AM 03/01/2024    4:55 AM 02/29/2024    5:00 AM  CMP  Glucose 70 - 99 mg/dL 882  91  888   BUN 8 - 23 mg/dL 48  56  56   Creatinine 0.61 - 1.24 mg/dL 8.21  8.00  8.22   Sodium 135 - 145 mmol/L 142  141  144   Potassium 3.5 - 5.1 mmol/L 3.5  4.7  4.7   Chloride 98 - 111 mmol/L 104  99  102   CO2 22 - 32 mmol/L 30  29  32   Calcium  8.9 - 10.3 mg/dL 7.9  8.0  8.1   Total Protein 6.5 - 8.1 g/dL   5.9   Total Bilirubin 0.0 - 1.2 mg/dL   1.4   Alkaline Phos 38 - 126 U/L   62    AST 15 - 41 U/L   74   ALT 0 - 44 U/L   221      ASSESSMENT / PLAN:  73 year old African-American male admitted to the intensive care unit for acute cardiac arrest likely due to severe decompensated congestive heart failure EF of 20% leading to mechanical ventilation in the setting of delirious possibly anoxic brain injury with failure to wean from the ventilator signs symptoms of aspiration  Severe ACUTE Hypoxic and Hypercapnic Respiratory Failure -continue Mechanical Ventilator support -Wean Fio2 and PEEP as tolerated -VAP/VENT bundle implementation - Wean PEEP & FiO2 as tolerated, maintain SpO2 > 88% - Head of bed elevated 30 degrees, VAP protocol in place - Plateau pressures less than 30 cm H20  - Intermittent chest x-ray & ABG PRN - Ensure adequate pulmonary hygiene   Acute hypoxic respiratory failure requiring reintubation Concern for aspiration due to AMS Copious tracheal secretions Consider bronch if secretions    CARDIOVASCULAR Out of hospital cardiac arrest s/p intubation and MV 06/26 s/p extubation 07/01. 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  BiV faiure with Echocardiogram with new reduced EF 20% w/ regional wall motion abnormalities noted. RV systolic function severely reduced.  New onset A.flutter  Mixed shock - cardiogenic and possibly distributive from recurrent sepsis  -Unable to anticoagulate due acute blood loss anemia -Continue amiodarone  infusion -Hemodynamic monitoring per ICU protocol - Pressors as needed to keep mean arterial blood pressure 65 and above -Continue digoxin  for rate control Follow up cardiology consultation  ACUTE KIDNEY INJURY/Renal Failure -continue Foley Catheter-assess need -Avoid nephrotoxic agents -Follow urine output, BMP -Ensure adequate renal perfusion, optimize oxygenation -Renal dose medications   Intake/Output Summary (Last 24 hours) at 03/02/2024 0804 Last data filed at 03/02/2024 0600 Gross per 24 hour  Intake 1780.24 ml  Output 1560  ml  Net 220.24 ml      Latest Ref Rng & Units 03/02/2024    3:42 AM 03/01/2024    4:55 AM 02/29/2024    5:00 AM  BMP  Glucose 70 - 99 mg/dL 882  91  888   BUN 8 - 23 mg/dL 48  56  56   Creatinine 0.61 - 1.24 mg/dL 8.21  8.00  8.22   Sodium 135 - 145 mmol/L 142  141  144   Potassium 3.5 - 5.1 mmol/L 3.5  4.7  4.7   Chloride 98 - 111 mmol/L 104  99  102   CO2 22 - 32 mmol/L 30  29  32   Calcium  8.9 - 10.3 mg/dL 7.9  8.0  8.1      GASTROINTESTINAL Questionable GI blood loss-patient's hemoglobin dropped to 7.1 requiring transfusion of 1 unit of packed red blood cells.  Hemoglobin is back to 8.0 this morning.   -Continue to trend hemoglobin and transfuse as needed - Will consider reconsulting GI if hemoglobin continues to drop or if patient has overt hematemesis or hematochezia -trickle tube feeds with water  flushes today and monitor for tolerance  HEMATOLOGIC Acute blood loss anemia-hg dropped to 7.1; now 8.0 post transfusion; likely source GI -transfuse if hemoglobin drops below 7  INFECTIOUS Persistent leukocytosis with recurrent sepsis-most likely source is respiratory given possible infiltrates on chest x-ray and copious tracheal secretions -F/U Sputum culture -Trend CBC and monitor fever curse -Empiric coverage with zosyn  -Procalcitonin level in the morning  ENDOCRINE Mild hyperglycemia without a diagnosis of diabetes  NEUROLOGIC Severe Encephalopathy-Contributing factors include ICU delirium, underlying severe mental illness and sepsis History of crack and cocaine use  RASS goal: 0-1 -Precedex , Seroquel  and Valium  for agitation and and delirium -PRNs Fentanyl  for vent discomfort -Monitor mental status closely and use mittens for safety as needed  Best Practice: Code Status: Full code Diet: N.p.o. GI prophylaxis: Protonix  Foley: Reinsert Foley catheter for strict I's and O's monitoring CVC: Left IJ still needed NG: Still needed VTE prophylaxis:  SCD's / patient is  not eligible for pharmacologic VTE prophylaxis due acute blood loss anemia     DVT/GI PRX  assessed I Assessed the need for Labs I Assessed the need for Foley I Assessed the need for Central Venous Line Family Discussion when available I Assessed the need for Mobilization I made an Assessment of medications to be adjusted accordingly Safety Risk assessment completed  CASE DISCUSSED IN MULTIDISCIPLINARY ROUNDS WITH ICU TEAM     Critical Care Time devoted to patient care services described in this note is 55 minutes.  Critical care was necessary to treat /prevent imminent and life-threatening deterioration. Overall, patient is critically ill, prognosis is guarded.  Patient with Multiorgan failure and at high risk for cardiac arrest and death.    Nickolas Alm Cellar, M.D.  [facility]  Medical Director [facility] Medical Director [facility] Cardio-Pulmonary Department

## 2024-03-02 NOTE — Consult Note (Signed)
 Chief Complaint: Nutritional Access. Request is for gastrostomy tube placement   Referring Physician(s): Dr. Nickolas Cellar  Supervising Physician: Hughes Simmonds  Patient Status: [facility] - In-pt  History of Present Illness: Miguel [name] is a 73 y.o. male inpatient. History of schizophrenia, polysubstance disorder, CVA, biventricular failure. Presented to the ED at [facility] from jail on 6.26.25 after being found gurgling by cell mate. Found to pulseless in asystole. CPR was initiated and  ROSC was achieved after 15 minutes. Patient was transported to [facility] via EMS. Patient was agitated en route and was given versed . Again he became pulseless and CPR was initiated. Again after 15 minutes ROSC was achieved. Patient currently intubated. Team is requesting gastrostomy tube placement for ongoing nutritional access.  Unable to assess ROS.   Patient currently intubated on precedex , vasopressor and propofol . BUN 48, Cr 1.78, GFR < 40 albumin 2.2 INR 1.4. All other labs and medications are within acceptable parameters.   Return precautions and treatment recommendations and follow-up discussed with the patient 's brother Miguel [name] who is agreeable with the plan.     Past Medical History:  Diagnosis Date   Biventricular failure (HCC)    CVA (cerebral vascular accident) (HCC)    Polysubstance use disorder    Schizophrenia (HCC)     Past Surgical History:  Procedure Laterality Date   APPENDECTOMY      Allergies: Patient has no known allergies.  Medications: Prior to Admission medications   Medication Sig Start Date End Date Taking? Authorizing Provider  ARISTADA 882 MG/3.2ML prefilled syringe Inject 882 mg into the muscle every 30 (thirty) days. 08/23/22  Yes [provider]  atorvastatin  (LIPITOR) 40 MG tablet Take 1 tablet (40 mg total) by mouth daily at 6 PM. 07/15/18  Yes Laurence Bridegroom, MD  furosemide  (LASIX ) 40 MG tablet Take 1 tablet (40 mg total) by mouth daily.  06/19/22 02/20/24 Yes Arlander Charleston, MD  losartan (COZAAR) 25 MG tablet Take 25 mg by mouth daily. 02/10/24  Yes [provider]     Family History  Family history unknown: Yes    Social History   Socioeconomic History   Marital status: Divorced    Spouse name: Not on file   Number of children: Not on file   Years of education: Not on file   Highest education level: Not on file  Occupational History   Not on file  Tobacco Use   Smoking status: Never   Smokeless tobacco: Never  Vaping Use   Vaping status: Never Used  Substance and Sexual Activity   Alcohol use: No   Drug use: No   Sexual activity: Not on file  Other Topics Concern   Not on file  Social History Narrative   Not on file   Social Drivers of Health   Financial Resource Strain: Not on file  Food Insecurity: Patient Unable To Answer (02/21/2024)   Hunger Vital Sign    Worried About Running Out of Food in the Last Year: Patient unable to answer    Ran Out of Food in the Last Year: Patient unable to answer  Transportation Needs: Patient Unable To Answer (02/21/2024)   PRAPARE - Transportation    Lack of Transportation (Medical): Patient unable to answer    Lack of Transportation (Non-Medical): Patient unable to answer  Physical Activity: Not on file  Stress: Not on file  Social Connections: Patient Unable To Answer (02/21/2024)   Social Connection and Isolation Panel    Frequency of  Communication with Friends and Family: Patient unable to answer    Frequency of Social Gatherings with Friends and Family: Patient unable to answer    Attends Religious Services: Patient unable to answer    Active Member of Clubs or Organizations: Patient unable to answer    Attends Banker Meetings: Patient unable to answer    Marital Status: Patient unable to answer     Review of Systems: A 12 point ROS discussed and pertinent positives are indicated in the HPI above.  All other systems are  negative.  Review of Systems  Constitutional:  Negative for fever.  HENT:  Negative for congestion.   Respiratory:  Negative for cough and shortness of breath.   Cardiovascular:  Negative for chest pain.  Gastrointestinal:  Negative for abdominal pain.  Neurological:  Negative for headaches.  Psychiatric/Behavioral:  Negative for behavioral problems and confusion.     Vital Signs: BP (!) 103/51   Pulse 64   Temp 97.7 F (36.5 C) (Axillary)   Resp 19   Ht 6' (1.829 m)   Wt 208 lb 5.4 oz (94.5 kg)   SpO2 99%   BMI 28.26 kg/m   Advance Care Plan: The advanced care plan/surrogate decision maker was discussed at the time of visit and documented in the medical record. Per EPIC brother Miguel [name]    Physical Exam Vitals and nursing note reviewed.  Constitutional:      Appearance: He is well-developed.  HENT:     Head: Normocephalic.  Cardiovascular:     Rate and Rhythm: Normal rate and regular rhythm.  Pulmonary:     Effort: Pulmonary effort is normal.  Musculoskeletal:        General: Normal range of motion.  Skin:    General: Skin is dry.     Imaging: CT CHEST WO CONTRAST Result Date: 03/01/2024 CLINICAL DATA:  Respiratory illness, nondiagnostic xray EXAM: CT CHEST WITHOUT CONTRAST TECHNIQUE: Multidetector CT imaging of the chest was performed following the standard protocol without IV contrast. RADIATION DOSE REDUCTION: This exam was performed according to the departmental dose-optimization program which includes automated exposure control, adjustment of the mA and/or kV according to patient size and/or use of iterative reconstruction technique. COMPARISON:  Chest x-ray 03/01/2024, CT angio chest 02/20/2024 FINDINGS: Cardiovascular: Enlarged heart size. Cardiac changes suggestive of anemia. No significant pericardial effusion. The thoracic aorta is normal in caliber. Mild atherosclerotic plaque of the thoracic aorta. Four-vessel coronary artery calcifications.  Mediastinum/Nodes: No gross hilar adenopathy, noting limited sensitivity for the detection of hilar adenopathy on this noncontrast study. No enlarged mediastinal or axillary lymph nodes. Thyroid gland, trachea, and esophagus demonstrate no significant findings. Lungs/Pleura: Least mild-to-moderate centrilobular emphysematous changes. Improved aeration of the lungs with persistent likely passive atelectasis of the bilateral upper lobes. Stable small to moderate right and interval increased small to moderate left pleural effusions. Passive atelectasis of bilateral lower lobes. Upper Abdomen: Trace to small volume simple free fluid ascites. Enteric 2 courses below the hemidiaphragm with tube noted within the gastric lumen and tip collimated view. Musculoskeletal: Bilateral gynecomastia. Redemonstration of acute to subacute bilateral anterior 3-6 rib fractures. Redemonstration of acute subacute nondisplaced inferior sternal body fracture (6:90). No suspicious lytic or blastic osseous lesions. No acute displaced fracture. IMPRESSION: 1. Improved aeration of the lungs with persistent likely passive atelectasis of the bilateral upper lobes. 2. Stable small to moderate right and interval increased in size small to moderate left pleural effusions. 3. Redemonstration of acute to subacute  bilateral anterior 3-6 rib fractures. 4. Redemonstration of acute subacute nondisplaced inferior sternal body fracture. 5. Cardiomegaly. 6. Findings suggestive of anemia. 7. Trace to small volume ascites. Electronically Signed   By: Morgane  Naveau M.D.   On: 03/01/2024 22:38   Portable Chest x-ray Result Date: 03/01/2024 CLINICAL DATA:  Endotracheal tube verification EXAM: PORTABLE CHEST 1 VIEW COMPARISON:  03/01/2024 FINDINGS: An endotracheal tube has been placed with tip measuring 4 cm above the carina. An enteric tube was placed. Tip is off field of view but below the left hemidiaphragm. Left central venous catheter with tip over the  mid SVC region. No pneumothorax. Shallow inspiration. Probable cardiac enlargement. Small pleural effusions with basilar atelectasis or infiltration. IMPRESSION: Appliances appear in satisfactory position. Cardiac enlargement. Small pleural effusions with basilar atelectasis or infiltration. Electronically Signed   By: Elsie Gravely M.D.   On: 03/01/2024 03:03   DG Chest 1 View Result Date: 03/01/2024 CLINICAL DATA:  [id number] SOB (shortness of breath) [id number] EXAM: CHEST  1 VIEW COMPARISON:  CT chest 02/20/2024, chest x-ray 02/28/2024 FINDINGS: Left internal jugular central venous catheter with tip overlying the expected region of the left brachiocephalic vein and superior vena cava confluence. Enteric tube coursing below the hemidiaphragm with tip and side port collimated off view. The heart and mediastinal contours are within normal limits. No focal consolidation. No pulmonary edema. Likely interval increase in size of amoderate volume right pleural effusion. Trace to small left pleural effusion. No pneumothorax. No acute osseous abnormality. IMPRESSION: 1. Likely interval increase in size of a moderate volume right pleural effusion. 2. Trace to small left pleural effusion. 3. Lines and tubes as above. Electronically Signed   By: Morgane  Naveau M.D.   On: 03/01/2024 01:59   DG Abd 1 View Result Date: 02/28/2024 CLINICAL DATA:  Encounter for feeding tube placement. EXAM: ABDOMEN - 1 VIEW COMPARISON:  Radiograph yesterday FINDINGS: Tip and side port of the enteric tube below the diaphragm in the stomach. Unchanged bowel gas pattern with gaseous distention of colon in the upper abdomen. IMPRESSION: Tip and side port of the enteric tube below the diaphragm in the stomach. Electronically Signed   By: Andrea Gasman M.D.   On: 02/28/2024 15:16   DG Chest Port 1 View Result Date: 02/28/2024 CLINICAL DATA:  73 year old male with respiratory failure and hypoxia. EXAM: PORTABLE CHEST 1 VIEW COMPARISON:  Portable  chest yesterday and earlier. FINDINGS: Portable AP view at 0802 hours. Enteric tube has been removed. Left IJ central line is stable. Stable lung volumes and mediastinal contours. Continued veiling bilateral lower lung opacity. Stable pulmonary vascularity and ventilation. No pneumothorax. No air bronchograms. No acute osseous abnormality identified. Paucity of bowel gas in the upper abdomen. IMPRESSION: 1. Stable bilateral pleural effusions. 2. Enteric tube removed. Electronically Signed   By: VEAR Hurst M.D.   On: 02/28/2024 08:11   DG Chest Port 1 View Result Date: 02/27/2024 CLINICAL DATA:  Acute respiratory failure, hypoxia EXAM: PORTABLE CHEST 1 VIEW COMPARISON:  02/23/2024 FINDINGS: Single frontal view of the chest excludes the left lung base by collimation. Enteric catheter passes below diaphragm tip excluded by collimation. Left internal jugular catheter tip overlies the superior vena cava. Cardiac silhouette is enlarged but stable. Increasing veiling opacities at the lung bases, right greater than left, consistent with pleural effusions and/or basilar consolidation. No pneumothorax. IMPRESSION: 1. Progressive bibasilar consolidation and/or effusions, right greater than left. 2. Support devices as above. Electronically Signed   By: Ozell Daring  M.D.   On: 02/27/2024 10:14   DG Abd 1 View Result Date: 02/27/2024 CLINICAL DATA:  Enteric catheter placement EXAM: ABDOMEN - 1 VIEW COMPARISON:  02/27/2024 FINDINGS: Supine frontal view of the abdomen excludes the lower pelvis and right hemidiaphragm by collimation. Enteric catheter tip and side port project over the gastric body. Stable nonspecific gaseous distention of the bowel. IMPRESSION: 1. Enteric catheter tip and side port projecting over the gastric body. Electronically Signed   By: Ozell Daring M.D.   On: 02/27/2024 10:13   DG Abd 1 View Result Date: 02/27/2024 CLINICAL DATA:  Feeding tube placement EXAM: ABDOMEN - 1 VIEW COMPARISON:  02/26/2024  FINDINGS: Small bore feeding catheter passes into the proximal stomach where it is doubled back on itself with the tip position near the region of the GE junction, similar to prior. IMPRESSION: Small bore feeding catheter is doubled back on itself in the proximal stomach with the tip near the region of the GE junction, similar to prior. Electronically Signed   By: Camellia Candle M.D.   On: 02/27/2024 07:28   DG Abd 1 View Result Date: 02/26/2024 CLINICAL DATA:  [id number] Nasogastric tube present [id number] EXAM: ABDOMEN - 1 VIEW COMPARISON:  X-ray abdomen 02/26/2024 5:22 p.m. FINDINGS: Cardiac paddles overlie the chest. Enteric tube coursing below the hemidiaphragm with tip overlying the expected region of the gastric lumen. Question kinking of the tube with persistent looping within the gastric. The bowel gas pattern is normal. No radio-opaque calculi or other significant radiographic abnormality are seen.At least small right pleural effusion. Left costophrenic angle not visualized. IMPRESSION: 1. Enteric tube coursing below the hemidiaphragm with tip overlying the expected region of the gastric lumen. Question kinking of the tube with persistent looping within the gastric. Recommend further retraction by 7 cm. 2. At least small right pleural effusion. Electronically Signed   By: Morgane  Naveau M.D.   On: 02/26/2024 18:20   DG Abd 1 View Result Date: 02/26/2024 CLINICAL DATA:  Nasogastric tube adjustment. EXAM: ABDOMEN - 1 VIEW COMPARISON:  Earlier today FINDINGS: The weighted enteric tube remains looped in the stomach with the tip directed cranially. There is slight decreased distention of the loop. Retraction of approximately 10 cm should released loop. Stable bowel gas pattern. IMPRESSION: Weighted enteric tube remains looped in the stomach with the tip directed cranially. Retraction of approximately 10 cm should release the loop. Electronically Signed   By: Andrea Gasman M.D.   On: 02/26/2024 17:35   DG Abd  1 View Result Date: 02/26/2024 CLINICAL DATA:  OG tube placement. EXAM: ABDOMEN - 1 VIEW COMPARISON:  None Available. FINDINGS: The weighted enteric tube is looped in the distal stomach, the tip is in the region of the mid proximal stomach directed cranially. Repositioning is recommended. There multiple overlying monitoring devices. Slight gaseous bowel distention in the upper abdomen. IMPRESSION: Weighted enteric tube is looped in the distal stomach, the tip is in the region of the mid proximal stomach directed cranially. Repositioning is recommended. Electronically Signed   By: Andrea Gasman M.D.   On: 02/26/2024 17:18   DG Abd 1 View Result Date: 02/26/2024 CLINICAL DATA:  NG placement. EXAM: ABDOMEN - 1 VIEW COMPARISON:  Abdominal radiograph dated 02/26/2024. FINDINGS: Feeding tube with weighted tip in the epigastric area likely in the region of the GE junction or proximal stomach. Recommend further advancing by additional 5-7 cm. IMPRESSION: Feeding tube with weighted tip in the epigastric area. Electronically Signed   By: Vanetta  Radparvar M.D.   On: 02/26/2024 16:13   DG Abd 1 View Result Date: 02/26/2024 CLINICAL DATA:  OG tube placement. EXAM: ABDOMEN - 1 VIEW COMPARISON:  02/20/2024. FINDINGS: Enteric tube tip in the proximal gastric body. Air distended loops of bowel. Visualized osseous structures are unchanged. Defibrillator pads overlie the left chest wall. IMPRESSION: Enteric tube tip in the proximal gastric body. Electronically Signed   By: Harrietta Sherry M.D.   On: 02/26/2024 14:46   ECHOCARDIOGRAM LIMITED Result Date: 02/24/2024    ECHOCARDIOGRAM LIMITED REPORT   Patient Name:   Miguel [name] Date of Exam: 02/24/2024 Medical Rec #:  [id number]         Height:       72.0 in Accession #:    [id number]        Weight:       203.7 lb Date of Birth:  08/09/1951          BSA:          2.147 m Patient Age:    73 years          BP:           119/73 mmHg Patient Gender: M                 HR:            144 bpm. Exam Location:  [facility] Procedure: Limited Echo and Intracardiac Opacification Agent (Both Spectral and            Color Flow Doppler were utilized during procedure). Indications:     LV (left ventricular) mural thrombus [[id number]]  History:         Patient has prior history of Echocardiogram examinations, most                  recent 02/20/2024.  Sonographer:     Ashley McNeely-Sloane Referring Phys:  [id number] DANA G NELSON Diagnosing Phys: Keller Alluri IMPRESSIONS  1. No definitive LV thrombus.  2. This is a limited study.  3. Left ventricular ejection fraction, by estimation, is 20%. The left ventricle has severely decreased function. The left ventricle demonstrates regional wall motion abnormalities (see scoring diagram/findings for description). FINDINGS  Left Ventricle: No LV thrombus. Left ventricular ejection fraction, by estimation, is 20%. The left ventricle has severely decreased function. The left ventricle demonstrates regional wall motion abnormalities. Definity  contrast agent was given IV to delineate the left ventricular endocardial borders. The left ventricular internal cavity size was normal in size.  LV Wall Scoring: The mid and distal anterior septum, entire apex, and mid inferoseptal segment are akinetic. The anterior wall, antero-lateral wall, inferior wall, posterior wall, basal anteroseptal segment, and basal inferoseptal segment are hypokinetic. Keller Paterson Electronically signed by Keller Paterson Signature Date/Time: 02/24/2024/6:42:47 PM    Final    MR BRAIN WO CONTRAST Result Date: 02/24/2024 CLINICAL DATA:  Mental status change, unknown cause EXAM: MRI HEAD WITHOUT CONTRAST TECHNIQUE: Multiplanar, multiecho pulse sequences of the brain and surrounding structures were obtained without intravenous contrast. COMPARISON:  CT of the head dated February 20, 2024 an MRI of the brain dated July 13, 2018. FINDINGS: Brain: There is no restricted diffusion to indicate acute or recent  infarction. There are encephalomalacia changes within the left frontal lobe secondary to an infarct which appeared acute the previous MRI. There is some associated hemosiderin staining, likely from either petechial hemorrhage or laminar necrosis. There has also been interval development of encephalomalacia changes within the  right medial occipital lobe compatible with an interval PCA distribution infarct. There is mild to moderate periventricular and deep cerebral white matter disease. There is no evidence of hemorrhage, mass or hydrocephalus. Vascular: Normal vascular flow voids. Skull and upper cervical spine: Normal marrow signal. No osseous lesions are present. Sinuses/Orbits: Clear paranasal sinuses.  Normal orbits. Other: None. IMPRESSION: 1. Since the previous MRI, the patient has developed chronic encephalomalacia changes within the right medial occipital lobe compatible with an interval PCA distribution infarct. 2. Interval development of encephalomalacia in area of previously demonstrated left MCA distribution infarct, which was acute on the previous MRI. Electronically Signed   By: Evalene Coho M.D.   On: 02/24/2024 15:34   DG Chest Port 1 View Result Date: 02/23/2024 CLINICAL DATA:  Respiratory failure with hypoxia. EXAM: PORTABLE CHEST 1 VIEW COMPARISON:  02/20/2024 FINDINGS: Endotracheal tube tip approximately 5.5 cm above the carina. Enteric tube is looped once over the stomach with tip over the stomach in the left upper quadrant. This could be retracted approximately 8 cm. Left IJ central venous catheter unchanged with tip obliquely oriented over the region of the SVC. Lungs are adequately inflated demonstrate persistent hazy opacification of the left base likely combination small layering effusion with atelectasis. Linear atelectasis over the lateral left base. New hazy opacification over the right base likely small layering effusion/atelectasis. Mild hazy opacification of the infrahilar  vessels which could be due to mild vascular congestion. Cardiomediastinal silhouette and remainder of the exam is unchanged. IMPRESSION: 1. Persistent hazy opacification of the left base likely combination small layering effusion with atelectasis. New hazy opacification over the right base likely small layering effusion/atelectasis. 2. Mild hazy opacification of the infrahilar vessels which may be due to mild vascular congestion. 3. Enteric tube is looped once over the stomach with tip over the stomach in the left upper quadrant. This could be retracted approximately 8 cm. Remaining tubes and lines as described. Electronically Signed   By: Toribio Agreste M.D.   On: 02/23/2024 10:26   EEG adult Result Date: 02/21/2024 Shelton Arlin KIDD, MD     02/21/2024  7:48 AM Patient Name: Miguel [name] MRN: [id number] Epilepsy Attending: Arlin KIDD Shelton Referring Physician/Provider: Maranda Lonell MATSU, NP Date: 02/20/2024 Duration: 28.30 mins Patient history: 73yo M s/p cardiac arrest. EEG to evaluate for seizure. Level of alertness: comatose AEDs during EEG study: Propofol  Technical aspects: This EEG study was done with scalp electrodes positioned according to the 10-20 International system of electrode placement. Electrical activity was reviewed with band pass filter of 1-70Hz , sensitivity of 7 uV/mm, display speed of 77mm/sec with a 60Hz  notched filter applied as appropriate. EEG data were recorded continuously and digitally stored.  Video monitoring was available and reviewed as appropriate. Description: EEG showed continuous generalized background attenuation. Hyperventilation and photic stimulation were not performed.   ABNORMALITY -Background attenuation, generalized IMPRESSION: This study is suggestive of severe to profound diffuse encephalopathy. No seizures or epileptiform discharges were seen throughout the recording. Arlin KIDD Shelton   DG Abd 1 View Result Date: 02/21/2024 CLINICAL DATA:  Orogastric tube placement  EXAM: ABDOMEN - 1 VIEW COMPARISON:  10:59 p.m. FINDINGS: Since the prior examination, the nasogastric tube is been advanced and its tip and proximal side hole are now position within the proximal body of the stomach. No other changes are seen. IMPRESSION: 1. Interval advancement of nasogastric tube, now in appropriate position. Electronically Signed   By: Dorethia Molt M.D.   On: 02/21/2024 00:03  DG Abd 1 View Result Date: 02/21/2024 CLINICAL DATA:  Orogastric tube placement EXAM: ABDOMEN - 1 VIEW COMPARISON:  None Available. FINDINGS: Orogastric tube tip overlies the proximal body of the stomach with the proximal side hole position within the distal esophagus. Normal abdominal gas pattern. Pelvis excluded from view. Small left pleural effusion. IMPRESSION: 1. Orogastric tube tip within the proximal body of the stomach. Advancement of the catheter by 15 cm is recommended for more optimal positioning. Electronically Signed   By: Dorethia Molt M.D.   On: 02/21/2024 00:02   ECHOCARDIOGRAM COMPLETE Result Date: 02/20/2024    ECHOCARDIOGRAM REPORT   Patient Name:   Miguel [name] Date of Exam: 02/20/2024 Medical Rec #:  [id number]         Height:       72.0 in Accession #:    [id number]        Weight:       228.0 lb Date of Birth:  [birth year]          BSA:          2.253 m Patient Age:    73 years          BP:           135/69 mmHg Patient Gender: M                 HR:           59 bpm. Exam Location:  [facility] Procedure: 2D Echo, Cardiac Doppler, Color Doppler and Intracardiac            Opacification Agent (Both Spectral and Color Flow Doppler were            utilized during procedure). Indications:     Cardiac arrest I46.9  History:         Patient has prior history of Echocardiogram examinations, most                  recent 09/11/2022. Cardiomyopathy.  Sonographer:     Ashley McNeely-Sloane Referring Phys:  [id number] KHABIB DGAYLI Diagnosing Phys: Timothy Gollan MD IMPRESSIONS  1. Left ventricular ejection  fraction, by estimation, is 20 to 25%. Left ventricular ejection fraction by 2D MOD biplane is 26.0 %. The left ventricle has severely decreased function. The left ventricle demonstrates global hypokinesis. Left ventricular diastolic parameters are indeterminate.  2. Definity  images detailing very slow flow in the periapical region, possible mural thrombus in the apical region.  3. Right ventricular systolic function is severely reduced. The right ventricular size is moderately enlarged. There is normal pulmonary artery systolic pressure. The estimated right ventricular systolic pressure is 15.0 mmHg.  4. The mitral valve is normal in structure. No evidence of mitral valve regurgitation. No evidence of mitral stenosis.  5. The aortic valve has an indeterminant number of cusps. Aortic valve regurgitation is not visualized. Aortic valve sclerosis is present, with no evidence of aortic valve stenosis.  6. The inferior vena cava is dilated in size with >50% respiratory variability, suggesting right atrial pressure of 8 mmHg. FINDINGS  Left Ventricle: Left ventricular ejection fraction, by estimation, is 20 to 25%. Left ventricular ejection fraction by 2D MOD biplane is 26.0 %. The left ventricle has severely decreased function. The left ventricle demonstrates global hypokinesis. Definity  contrast agent was given IV to delineate the left ventricular endocardial borders. Strain was performed and the global longitudinal strain is indeterminate. The left ventricular internal cavity size was normal in size. There is  no left ventricular hypertrophy. Left ventricular diastolic parameters are indeterminate. Right Ventricle: The right ventricular size is moderately enlarged. No increase in right ventricular wall thickness. Right ventricular systolic function is severely reduced. There is normal pulmonary artery systolic pressure. The tricuspid regurgitant velocity is 1.58 m/s, and with an assumed right atrial pressure of 5 mmHg,  the estimated right ventricular systolic pressure is 15.0 mmHg. Left Atrium: Left atrial size was normal in size. Right Atrium: Right atrial size was normal in size. Pericardium: There is no evidence of pericardial effusion. Mitral Valve: The mitral valve is normal in structure. No evidence of mitral valve regurgitation. No evidence of mitral valve stenosis. Tricuspid Valve: The tricuspid valve is normal in structure. Tricuspid valve regurgitation is mild . No evidence of tricuspid stenosis. Aortic Valve: The aortic valve has an indeterminant number of cusps. Aortic valve regurgitation is not visualized. Aortic valve sclerosis is present, with no evidence of aortic valve stenosis. Aortic valve mean gradient measures 1.0 mmHg. Aortic valve peak gradient measures 1.8 mmHg. Aortic valve area, by VTI measures 2.16 cm. Pulmonic Valve: The pulmonic valve was normal in structure. Pulmonic valve regurgitation is mild. No evidence of pulmonic stenosis. Aorta: The aortic root is normal in size and structure. Venous: The inferior vena cava is dilated in size with greater than 50% respiratory variability, suggesting right atrial pressure of 8 mmHg. IAS/Shunts: No atrial level shunt detected by color flow Doppler. Additional Comments: 3D was performed not requiring image post processing on an independent workstation and was indeterminate.  LEFT VENTRICLE PLAX 2D                        Biplane EF (MOD) LVIDd:         5.00 cm         LV Biplane EF:   Left LVIDs:         4.40 cm                          ventricular LV PW:         1.10 cm                          ejection LV IVS:        1.00 cm                          fraction by LVOT diam:     1.90 cm                          2D MOD LV SV:         23                               biplane is LV SV Index:   10                               26.0 %. LVOT Area:     2.84 cm                                Diastology  LV e' medial:    2.98 cm/s LV Volumes  (MOD)               LV E/e' medial:  20.5 LV vol d, MOD    111.0 ml      LV e' lateral:   3.60 cm/s A2C:                           LV E/e' lateral: 17.0 LV vol d, MOD    109.0 ml A4C: LV vol s, MOD    99.8 ml A2C: LV vol s, MOD    68.0 ml A4C: LV SV MOD A2C:   11.2 ml LV SV MOD A4C:   109.0 ml LV SV MOD BP:    29.3 ml RIGHT VENTRICLE RV Basal diam:  6.80 cm RV Mid diam:    4.70 cm RV S prime:     5.50 cm/s TAPSE (M-mode): 0.7 cm LEFT ATRIUM           Index        RIGHT ATRIUM           Index LA diam:      3.00 cm 1.33 cm/m   RA Area:     26.20 cm LA Vol (A2C): 33.4 ml 14.83 ml/m  RA Volume:   95.50 ml  42.40 ml/m LA Vol (A4C): 28.5 ml 12.65 ml/m  AORTIC VALVE                    PULMONIC VALVE AV Area (Vmax):    1.93 cm     PV Vmax:        0.62 m/s AV Area (Vmean):   1.71 cm     PV Vmean:       37.800 cm/s AV Area (VTI):     2.16 cm     PV VTI:         0.118 m AV Vmax:           67.90 cm/s   PV Peak grad:   1.5 mmHg AV Vmean:          46.200 cm/s  PV Mean grad:   1.0 mmHg AV VTI:            0.105 m      RVOT Peak grad: 0 mmHg AV Peak Grad:      1.8 mmHg AV Mean Grad:      1.0 mmHg LVOT Vmax:         46.30 cm/s LVOT Vmean:        27.900 cm/s LVOT VTI:          0.080 m LVOT/AV VTI ratio: 0.76  AORTA Ao Root diam: 3.70 cm MITRAL VALVE               TRICUSPID VALVE MV Area (PHT): 3.85 cm    TV Peak grad:   12.8 mmHg MV Decel Time: 197 msec    TV Vmax:        1.79 m/s MV E velocity: 61.10 cm/s  TR Peak grad:   10.0 mmHg MV A velocity: 64.70 cm/s  TR Mean grad:   7.0 mmHg MV E/A ratio:  0.94        TR Vmax:        158.00 cm/s  TR Vmean:       126.0 cm/s                             SHUNTS                            Systemic VTI:  0.08 m                            Systemic Diam: 1.90 cm                            Pulmonic VTI:  0.057 m Evalene Lunger MD Electronically signed by Evalene Lunger MD Signature Date/Time: 02/20/2024/12:20:12 PM    Final    DG Chest Port 1 View Result Date:  02/20/2024 CLINICAL DATA:  [id number] Encounter for central line placement [id number] EXAM: PORTABLE CHEST - 1 VIEW COMPARISON:  02/20/2024 FINDINGS: Endotracheal tube terminates in the mid trachea, unchanged. Left IJ approach central venous catheter terminates in the region of the brachiocephalic vein confluence/upper SVC. Hazy airspace opacities noted again throughout both lungs. The bilateral pleural effusions are less conspicuous than on the prior CT. Mild cardiomegaly. Esophagogastric tube courses below the diaphragm with the distal tip not included in the field of view. IMPRESSION: 1. Left IJ approach central venous catheter terminates in the region of the brachiocephalic vein confluence/upper SVC. No pneumothorax. 2. Little significant interval change to the lungs. Similarly positioned endotracheal tube. Electronically Signed   By: Rogelia Myers M.D.   On: 02/20/2024 11:49   CT Head Wo Contrast Result Date: 02/20/2024 CLINICAL DATA:  Mental status change, unknown cause post-rosc EXAM: CT HEAD WITHOUT CONTRAST TECHNIQUE: Contiguous axial images were obtained from the base of the skull through the vertex without intravenous contrast. RADIATION DOSE REDUCTION: This exam was performed according to the departmental dose-optimization program which includes automated exposure control, adjustment of the mA and/or kV according to patient size and/or use of iterative reconstruction technique. COMPARISON:  MRI of the head dated July 13, 2018 and CT of the head dated July 12, 2018. FINDINGS: Brain: There is been interval development of loss of the gray-white junction in the left frontal lobe seen on images 11 through 14 of series 3. There has also been interval development of an area of decreased attenuation in the subcortical white matter of the right occipital lobe on image 12, which measures approximately 18 x 9 mm. There is no evidence of acute intracranial injury. Vascular: Mild calcific atheromatous disease  within the carotid siphons. Skull: Intact.  No osseous lesions are present. Sinuses/Orbits: Negative. Other: None. IMPRESSION: 1. There is of focal area of abnormal density within the left frontal lobe, which could represent a subacute cortical infarct. Correlation with MRI of the brain is suggested. 2. Interval development of a focal area of diminished attenuation within the subcortical white matter of the right occipital lobe, possibly representing a chronic subcortical infarct. This also could be better assessed with MRI. Electronically Signed   By: Evalene Coho M.D.   On: 02/20/2024 10:19   DG Abdomen 1 View Result Date: 02/20/2024 CLINICAL DATA:  Orogastric tube placement. EXAM: ABDOMEN - 1 VIEW COMPARISON:  September 10, 2022. FINDINGS: Distal tip of nasogastric tube is seen in expected position of proximal stomach. IMPRESSION: Nasogastric tube tip seen in expected position  of proximal stomach. Electronically Signed   By: Lynwood Landy Raddle M.D.   On: 02/20/2024 09:45   CT Angio Chest PE W and/or Wo Contrast Result Date: 02/20/2024 CLINICAL DATA:  Cardiac arrest EXAM: CT ANGIOGRAPHY CHEST CT ABDOMEN AND PELVIS WITH CONTRAST TECHNIQUE: Multidetector CT imaging of the chest was performed using the standard protocol during bolus administration of intravenous contrast. Multiplanar CT image reconstructions and MIPs were obtained to evaluate the vascular anatomy. Multidetector CT imaging of the abdomen and pelvis was performed using the standard protocol during bolus administration of intravenous contrast. RADIATION DOSE REDUCTION: This exam was performed according to the departmental dose-optimization program which includes automated exposure control, adjustment of the mA and/or kV according to patient size and/or use of iterative reconstruction technique. CONTRAST:  75mL OMNIPAQUE  IOHEXOL  350 MG/ML SOLN COMPARISON:  Same day chest radiograph, CT abdomen and pelvis dated 12/09/2021 FINDINGS: CTA CHEST  FINDINGS Cardiovascular: The study is adequate for the evaluation of pulmonary embolism to the level of distal segmental pulmonary arteries due to motion artifact. There are no filling defects in the central, lobar, or distal segmental pulmonary artery branches to suggest acute pulmonary embolism. Great vessels are normal in course and caliber. Normal heart size. Rounded filling defect within the right atrial appendage (7:83). No significant pericardial fluid/thickening. Reflux of contrast material into the hepatic veins, suggesting a degree of right heart dysfunction. Coronary artery calcifications. Mediastinum/Nodes: Imaged thyroid gland without nodules meeting criteria for imaging follow-up by size. Normal esophagus. No pathologically enlarged axillary, supraclavicular, mediastinal, or hilar lymph nodes. Lungs/Pleura: Endotracheal tube tip terminates 2.2 cm above the carina. The central airways are patent. Trace layering secretions within the trachea with complete effacement of the distal left main bronchus and multifocal subsegmental mucous plugging. Bilateral lower lobe consolidations and irregular consolidations within the dependent upper lobes and right middle lobe. Scattered adjacent ground-glass opacities. Diffuse interlobular septal thickening. Go to pleural effusion moderate right and small left pleural effusions. No pneumothorax. No pleural effusion. Musculoskeletal: Healing right anterolateral third rib fracture. Minimally displaced right anterior fourth through sixth rib fractures. Nondisplaced left anterior third, fourth, and sixth rib fractures. Suspected nondisplaced fracture at the seventh costochondral junction. Diffuse body wall edema. Review of the MIP images confirms the above findings. CT ABDOMEN and PELVIS FINDINGS Hepatobiliary: No focal hepatic lesions. No intra or extrahepatic biliary ductal dilation. No gallbladder distension. Pancreas: No focal lesions or main ductal dilation. Spleen:  Normal in size without focal abnormality. Adrenals/Urinary Tract: No adrenal nodules. No suspicious renal mass, calculi, or hydronephrosis. Urinary bladder is decompressed with catheter in-situ and contains a small volume intraluminal air. Stomach/Bowel: Normal appearance of the stomach. No abnormal bowel dilation. Mild diffuse mural thickening. Moderate to large volume stool within the rectosigmoid colon. Appendix is not discretely seen. Vascular/Lymphatic: Right femoral approach central venous catheter tip terminates along the posterior margin of the distal right common iliac vein. Aortic atherosclerosis. Retroaortic left renal vein. Nonspecific 10 mm aortocaval lymph node (3:34). Reproductive: Mildly enlarged prostate gland. Other: Small volume free fluid.  No free air or fluid collection. Musculoskeletal: No acute or suspicious lytic or blastic lesions. Diffuse body wall edema. Multilevel degenerative changes of the lumbar spine. IMPRESSION: CTA CHEST: 1. No evidence of acute pulmonary embolism. 2. Rounded filling defect within the right atrial appendage, suspicious for thrombus. 3. Multi lobar pulmonary findings, likely a combination of atelectasis, pulmonary edema, and aspiration. 4. Moderate right and small left pleural effusions. 5. Reflux of contrast material into the hepatic  veins, suggesting a degree of right heart dysfunction. 6. Multiple bilateral anterior rib fractures. 7. CT ABDOMEN AND PELVIS: 1. Mild diffuse mural thickening, which may be seen in the setting of hypoperfusion. 2. Small volume free fluid. 3.  Aortic Atherosclerosis (ICD10-I70.0). Electronically Signed   By: Limin  Xu M.D.   On: 02/20/2024 09:35   CT ABDOMEN PELVIS W CONTRAST Result Date: 02/20/2024 CLINICAL DATA:  Cardiac arrest EXAM: CT ANGIOGRAPHY CHEST CT ABDOMEN AND PELVIS WITH CONTRAST TECHNIQUE: Multidetector CT imaging of the chest was performed using the standard protocol during bolus administration of intravenous contrast.  Multiplanar CT image reconstructions and MIPs were obtained to evaluate the vascular anatomy. Multidetector CT imaging of the abdomen and pelvis was performed using the standard protocol during bolus administration of intravenous contrast. RADIATION DOSE REDUCTION: This exam was performed according to the departmental dose-optimization program which includes automated exposure control, adjustment of the mA and/or kV according to patient size and/or use of iterative reconstruction technique. CONTRAST:  75mL OMNIPAQUE  IOHEXOL  350 MG/ML SOLN COMPARISON:  Same day chest radiograph, CT abdomen and pelvis dated 12/09/2021 FINDINGS: CTA CHEST FINDINGS Cardiovascular: The study is adequate for the evaluation of pulmonary embolism to the level of distal segmental pulmonary arteries due to motion artifact. There are no filling defects in the central, lobar, or distal segmental pulmonary artery branches to suggest acute pulmonary embolism. Great vessels are normal in course and caliber. Normal heart size. Rounded filling defect within the right atrial appendage (7:83). No significant pericardial fluid/thickening. Reflux of contrast material into the hepatic veins, suggesting a degree of right heart dysfunction. Coronary artery calcifications. Mediastinum/Nodes: Imaged thyroid gland without nodules meeting criteria for imaging follow-up by size. Normal esophagus. No pathologically enlarged axillary, supraclavicular, mediastinal, or hilar lymph nodes. Lungs/Pleura: Endotracheal tube tip terminates 2.2 cm above the carina. The central airways are patent. Trace layering secretions within the trachea with complete effacement of the distal left main bronchus and multifocal subsegmental mucous plugging. Bilateral lower lobe consolidations and irregular consolidations within the dependent upper lobes and right middle lobe. Scattered adjacent ground-glass opacities. Diffuse interlobular septal thickening. Go to pleural effusion  moderate right and small left pleural effusions. No pneumothorax. No pleural effusion. Musculoskeletal: Healing right anterolateral third rib fracture. Minimally displaced right anterior fourth through sixth rib fractures. Nondisplaced left anterior third, fourth, and sixth rib fractures. Suspected nondisplaced fracture at the seventh costochondral junction. Diffuse body wall edema. Review of the MIP images confirms the above findings. CT ABDOMEN and PELVIS FINDINGS Hepatobiliary: No focal hepatic lesions. No intra or extrahepatic biliary ductal dilation. No gallbladder distension. Pancreas: No focal lesions or main ductal dilation. Spleen: Normal in size without focal abnormality. Adrenals/Urinary Tract: No adrenal nodules. No suspicious renal mass, calculi, or hydronephrosis. Urinary bladder is decompressed with catheter in-situ and contains a small volume intraluminal air. Stomach/Bowel: Normal appearance of the stomach. No abnormal bowel dilation. Mild diffuse mural thickening. Moderate to large volume stool within the rectosigmoid colon. Appendix is not discretely seen. Vascular/Lymphatic: Right femoral approach central venous catheter tip terminates along the posterior margin of the distal right common iliac vein. Aortic atherosclerosis. Retroaortic left renal vein. Nonspecific 10 mm aortocaval lymph node (3:34). Reproductive: Mildly enlarged prostate gland. Other: Small volume free fluid.  No free air or fluid collection. Musculoskeletal: No acute or suspicious lytic or blastic lesions. Diffuse body wall edema. Multilevel degenerative changes of the lumbar spine. IMPRESSION: CTA CHEST: 1. No evidence of acute pulmonary embolism. 2. Rounded filling defect within the  right atrial appendage, suspicious for thrombus. 3. Multi lobar pulmonary findings, likely a combination of atelectasis, pulmonary edema, and aspiration. 4. Moderate right and small left pleural effusions. 5. Reflux of contrast material into the  hepatic veins, suggesting a degree of right heart dysfunction. 6. Multiple bilateral anterior rib fractures. 7. CT ABDOMEN AND PELVIS: 1. Mild diffuse mural thickening, which may be seen in the setting of hypoperfusion. 2. Small volume free fluid. 3.  Aortic Atherosclerosis (ICD10-I70.0). Electronically Signed   By: Limin  Xu M.D.   On: 02/20/2024 09:35   DG Chest Portable 1 View Result Date: 02/20/2024 CLINICAL DATA:  ETT placement. EXAM: PORTABLE CHEST 1 VIEW COMPARISON:  04/27/2023. FINDINGS: Extensive artifact overlies the chest which limits the study. Diffuse pulmonary interstitial prominence. No definite pneumothorax or pleural effusion. Endotracheal tube tip about 3 cm above carina. NG tube tip superimposed with stomach below diaphragm. IMPRESSION: Limited study due to artifact. Diffuse interstitial prominence. Tubes and lines as above. Electronically Signed   By: Fonda Field M.D.   On: 02/20/2024 07:47    Labs:  CBC: Recent Labs    02/29/24 0500 02/29/24 1433 03/01/24 0455 03/02/24 0342  WBC 13.1* 13.8* 18.1* 16.7*  HGB 7.5* 7.1* 8.0* 8.0*  HCT 21.8* 21.8* 24.6* 24.8*  PLT 346 329 364 372    COAGS: Recent Labs    02/20/24 1344 02/20/24 2256 02/21/24 0420 02/26/24 0401 03/02/24 0342  INR 3.4* 2.5* 2.8* 1.2 1.4*  APTT 41*  --   --   --   --     BMP: Recent Labs    02/28/24 0306 02/29/24 0500 03/01/24 0455 03/02/24 0342  NA 145 144 141 142  K 4.6 4.7 4.7 3.5  CL 104 102 99 104  CO2 32 32 29 30  GLUCOSE 129* 111* 91 117*  BUN 54* 56* 56* 48*  CALCIUM  8.1* 8.1* 8.0* 7.9*  CREATININE 1.64* 1.77* 1.99* 1.78*  GFRNONAA 44* 41* 35* 40*    LIVER FUNCTION TESTS: Recent Labs    02/26/24 0401 02/27/24 0243 02/28/24 0306 02/29/24 0500 03/01/24 0455 03/02/24 0342  BILITOT 1.5* 1.5* 1.2 1.4*  --   --   AST 116* 87* 76* 74*  --   --   ALT 454* 333* 251* 221*  --   --   ALKPHOS 78 68 62 62  --   --   PROT 5.9* 6.0* 5.8* 5.9*  --   --   ALBUMIN 2.5* 2.5*  2.3* 2.5* 2.6* 2.2*     Assessment and Plan:  73 y.o. male inpatient. History of schizophrenia, polysubstance disorder, CVA, biventricular failure. [facility] ED [facility] 6[facility]25 after being found gurgling by cell mate. Found to pulseless in asystole. CPR was initiated and  ROSC was achieved after 15 minutes. Patient was transported to [facility] via EMS. Patient was agitated en route and was given versed . Again he became pulseless and CPR was initiated. Again after 15 minutes ROSC was achieved. Patient currently intubated. Team is requesting gastrostomy tube placement for ongoing nutritional access.  PLAN: IR Image Guided Gastrostomy Tube Placement  Risks and benefits image guided gastrostomy tube placement was discussed with the patient including, but not limited to the need for a barium enema during the procedure, bleeding, infection, peritonitis and/or damage to adjacent structures.  All of the patient's  brother's Remer Couse questions were answered, brother is agreeable to proceed.  Consent signed and in IR Control room    Thank you for  this interesting consult.  I greatly enjoyed meeting Miguel [name] and look forward to participating in their care.  A copy of this report was sent to the requesting provider on this date.  Electronically Signed: Delon JAYSON Beagle, NP 03/02/2024, 3:46 PM   I spent a total of 40 Minutes    in face to face in clinical consultation, greater than 50% of which was counseling/coordinating care for g tube placement

## 2024-03-02 NOTE — IPAL (Signed)
  Interdisciplinary Goals of Care Family Meeting   Date carried out: 03/02/2024  Location of the meeting: Conference room  Member's involved: Physician, Nurse Practitioner, and Family Member or next of kin    GOALS OF CARE DISCUSSION  The Clinical status was relayed to family in detail-Brother  Updated and notified of patients medical condition- Patient remains unresponsive and will not open eyes to command.   Patient with increased WOB and using accessory muscles to breathe Explained to family course of therapy and the modalities  Patient with Progressive multiorgan failure with a very high probablity of a very minimal chance of meaningful recovery despite all aggressive and optimal medical therapy.   Patient will need [facility] AND PEG TUBE FOR SURVIVAL Patient has failed extubation, severe inability to protect airway Patient with severe heart failure  PATIENT REMAINS FULL CODE  Plan for ENT and IR consultation for [facility] AND PEG tube  Family understands the situation.  Family are satisfied with Plan of action and management. All questions answered  Additional CC time 35 mins   Ramez Arrona Alm Cellar, M.D.  [facility]  Medical Director [facility] Medical Director [facility] Cardio-Pulmonary Department

## 2024-03-02 NOTE — TOC Progression Note (Signed)
 Transition of Care [facility]) - Progression Note    Patient Details  Name: Miguel [name] MRN: [id number] Date of Birth: 09-Nov-1950  [facility] of [facility]) CM/SW Contact  Amarise Lillo A Rosemae Mcquown, RN Phone Number: 03/02/2024, 4:22 PM  Clinical Narrative:    Chart reviewed.  Noted that patient was re-intubated on 03-01-2024.  Patient may require peg and trach due to his Clinical status.    I have reached out to financial counselor to see if they can assist with what payor patient may have.    TOC will continue to follow for discharge planning.         Expected Discharge Plan and Services                                               Social Determinants of Health (SDOH) Interventions SDOH Screenings   Food Insecurity: Patient Unable To Answer (02/21/2024)  Housing: Patient Unable To Answer (02/21/2024)  Transportation Needs: Patient Unable To Answer (02/21/2024)  Utilities: Patient Unable To Answer (02/21/2024)  Social Connections: Patient Unable To Answer (02/21/2024)  Tobacco Use: Low Risk  (02/20/2024)    Readmission Risk Interventions     No data to display

## 2024-03-02 NOTE — Progress Notes (Signed)
 PT TAKEN TO/FROM C.T. ON SERVO AIR VENTILATOR WITH NO COMPLICATIONS.

## 2024-03-02 NOTE — Progress Notes (Signed)
 Nutrition Follow-up  DOCUMENTATION CODES:   Severe malnutrition in context of social or environmental circumstances  INTERVENTION:   -Continue TF via NGT:   D/c Vital High Protein  Initiate Vital 1.5 @ 20 ml/hr and increase by 10 ml every 4 hours to goal rate of 50 ml/hr.   60 ml Prosource TF20 BID   200 ml free water  flush every 4 hours  Tube feeding regimen provides 1960 kcal (100% of needs), 121 grams of protein, and 917 ml of H2O. Total free water : 2117 ml free water    -Continue MVI with minerals daily -Continue 100 mg thiamine  daily x 7 days  NUTRITION DIAGNOSIS:   Severe Malnutrition related to social / environmental circumstances as evidenced by moderate fat depletion, severe fat depletion, moderate muscle depletion, severe muscle depletion.  Ongoing  GOAL:   Provide needs based on ASPEN/SCCM guidelines  Progressing   MONITOR:   Vent status, TF tolerance  REASON FOR ASSESSMENT:   Consult Enteral/tube feeding initiation and management  ASSESSMENT:   73 y/o male with h/o CHF, dilated cardiomyopathy, substance abuse, schizophrenia, CVA, depression, HCV, homelessness and recently in jail who is admitted with PEA arrest, AKI, CHF with bilateral pleural effusions, thrombus, shock, H influenza and strep pneumoniae and sepsis.  6/26- intubated, OGT placed (KUB reveals tip of tube in stomach), s/p bronchoscopy, s/p EEG 6/27- EEG revealed severe to profound diffuse encephalopathy, no seizures or epileptiform discharges were seen 7/1- self-extubated 7/2- s/p BSE- NPO, NGT placed (tip of tube in stomach per KUB), TF initiated 7/6- re-intubated, TF initiated  Patient is currently intubated on ventilator support MV: 8.8 L/min Temp (24hrs), Avg:98.3 F (36.8 C), Min:97.8 F (36.6 C), Max:98.7 F (37.1 C)  Propofol : 5.7 ml/hr (provides 150 kcals daily)  Reviewed I/O's: +220 ml x 24 hours and +2.2 L since admission  UOP: 1.6 L x 24 hours   TF initiated  yesterday. Noted Vital High Protein infusing @ 20 ml/hr via NGT.   RD re-examined pt. RD suspected that pt had worsening malnutrition PTA, however, unable to identify severity due to degree of edema. Noted improved edema on exam (moderate initially vs mild today). Pt now meets criteria for severe malnutrition (suspect pt was severely malnourished PTA).   No wt loss noted over the past week.   Palliative care following for goals of care discussions. Pt family desires full scope care at this time.   Medications reviewed and include MVI, protonix , miralax , thiamine , precedex , levophed , and propofol .   Labs reviewed: Mg: 1.25, CBGS: 49-213 (inpatient orders for glycemic control are none).    NUTRITION - FOCUSED PHYSICAL EXAM:  Flowsheet Row Most Recent Value  Orbital Region Severe depletion  Upper Arm Region Moderate depletion  Thoracic and Lumbar Region Mild depletion  Buccal Region Severe depletion  Temple Region Severe depletion  Clavicle Bone Region Severe depletion  Clavicle and Acromion Bone Region Severe depletion  Scapular Bone Region Severe depletion  Dorsal Hand Mild depletion  Patellar Region Moderate depletion  Anterior Thigh Region Moderate depletion  Posterior Calf Region Moderate depletion  Edema (RD Assessment) Mild  Hair Reviewed  Eyes Reviewed  Mouth Reviewed  Skin Reviewed  Nails Reviewed    Diet Order:   Diet Order             Diet NPO time specified  Diet effective now                   EDUCATION NEEDS:   Not appropriate for education  at this time  Skin:  Skin Assessment: Reviewed RN Assessment  Last BM:  03/01/24 (type 6)  Height:   Ht Readings from Last 1 Encounters:  02/21/24 6' (1.829 m)    Weight:   Wt Readings from Last 1 Encounters:  03/01/24 94.5 kg    Ideal Body Weight:  80.9 kg  BMI:  Body mass index is 28.26 kg/m.  Estimated Nutritional Needs:   Kcal:  1930  Protein:  120-140 grams  Fluid:  1.9-2.1  L    Margery ORN, RD, LDN, CDCES Registered Dietitian III Certified Diabetes Care and Education Specialist If unable to reach this RD, please use RD Inpatient group chat on secure chat between hours of 8am-4 pm daily

## 2024-03-02 NOTE — Plan of Care (Signed)
  Problem: Clinical Measurements: Goal: Respiratory complications will improve Outcome: Progressing   Problem: Nutrition: Goal: Adequate nutrition will be maintained Outcome: Progressing   Problem: Pain Managment: Goal: General experience of comfort will improve and/or be controlled Outcome: Progressing

## 2024-03-02 NOTE — Progress Notes (Signed)
 PHARMACY CONSULT NOTE - FOLLOW UP  Pharmacy Consult for Electrolyte Monitoring and Replacement   Recent Labs: Potassium (mmol/L)  Date Value  03/02/2024 3.5  09/15/2012 4.4   Magnesium  (mg/dL)  Date Value  92/92/7974 2.5 (H)   Calcium  (mg/dL)  Date Value  92/92/7974 7.9 (L)   Calcium , Total (mg/dL)  Date Value  98/79/7985 9.4   Albumin (g/dL)  Date Value  92/92/7974 2.2 (L)  09/15/2012 4.2   Phosphorus (mg/dL)  Date Value  92/92/7974 2.5   Sodium (mmol/L)  Date Value  03/02/2024 142  09/15/2012 146 (H)    Assessment: 73 y/o male with h/o CHF, dilated cardiomyopathy, substance abuse, schizophrenia, CVA, depression, HCV, homelessness and recently in jail who is admitted with PEA arrest, AKI, CHF with bilateral pleural effusions, thrombus, shock, H influenza and strep pneumoniae and sepsis. Pharmacy is asked to follow and replace electrolytes while in CCU  Goal of Therapy:  Potassium 4.0 - 5.1 mmol/L Magnesium  2.0 - 2.4 mg/dL All Other Electrolytes WNL  Plan:  --10 mEq IV KCl x 2 --recheck electrolytes in am  Adriana [name] Bolster ,PharmD Clinical Pharmacist 03/02/2024 12:30 PM

## 2024-03-02 NOTE — Progress Notes (Signed)
 OT Cancellation Note  Patient Details Name: Miguel [name] MRN: [id number] DOB: [birth year]   Cancelled Treatment:    Reason Eval/Treat Not Completed: Medical issues which prohibited therapy. Chart reviewed - pt intubated and sedated. Per therapy protocols, will sign off at this time, please re-consult as pt appropriate to engage in therapy.   Elston Slot, M.S. OTR/L  03/02/24, 9:34 AM  ascom [phone]

## 2024-03-02 NOTE — Progress Notes (Signed)
 Patient attempted self extubation at approximately 0545. This RN responded to the ventilator alarming due to patient coughing. While attempting to suction, the patient became agitated and was able to get hand around ETT and started pulling. Once additional sedation medication given, patient was not showing signs of displacement of ETT. No desaturation, ventilator compliance good, + bilateral breath sounds. E.Ouma NP came to bedside to evaluate. Event reviewed and sedation medication order modified.

## 2024-03-02 NOTE — Progress Notes (Signed)
 PT Cancellation Note  Patient Details Name: Miguel [name] MRN: [id number] DOB: [birth year]   Cancelled Treatment:    Reason Eval/Treat Not Completed: Patient's level of consciousness;Patient not medically ready (Patient is sedated and has been intubated since PT evaluation. Spoke with bedside nurse who is in agreement for PT to sign off at this time. Please re-consult PT when ready.)  [name], PT, MPT  [name] 03/02/2024, 9:30 AM

## 2024-03-03 ENCOUNTER — Other Ambulatory Visit: Payer: Self-pay

## 2024-03-03 ENCOUNTER — Other Ambulatory Visit: Payer: Self-pay | Admitting: Otolaryngology

## 2024-03-03 ENCOUNTER — Inpatient Hospital Stay

## 2024-03-03 DIAGNOSIS — J9601 Acute respiratory failure with hypoxia: Secondary | ICD-10-CM | POA: Diagnosis not present

## 2024-03-03 DIAGNOSIS — E43 Unspecified severe protein-calorie malnutrition: Secondary | ICD-10-CM | POA: Insufficient documentation

## 2024-03-03 DIAGNOSIS — Z9911 Dependence on respirator [ventilator] status: Secondary | ICD-10-CM | POA: Diagnosis not present

## 2024-03-03 DIAGNOSIS — R1310 Dysphagia, unspecified: Secondary | ICD-10-CM | POA: Diagnosis not present

## 2024-03-03 DIAGNOSIS — J9602 Acute respiratory failure with hypercapnia: Secondary | ICD-10-CM | POA: Diagnosis not present

## 2024-03-03 DIAGNOSIS — R7989 Other specified abnormal findings of blood chemistry: Secondary | ICD-10-CM | POA: Diagnosis not present

## 2024-03-03 DIAGNOSIS — J9690 Respiratory failure, unspecified, unspecified whether with hypoxia or hypercapnia: Secondary | ICD-10-CM | POA: Diagnosis not present

## 2024-03-03 DIAGNOSIS — Z4682 Encounter for fitting and adjustment of non-vascular catheter: Secondary | ICD-10-CM | POA: Diagnosis not present

## 2024-03-03 DIAGNOSIS — I469 Cardiac arrest, cause unspecified: Secondary | ICD-10-CM | POA: Diagnosis not present

## 2024-03-03 LAB — BASIC METABOLIC PANEL WITH GFR
Anion gap: 10 (ref 5–15)
BUN: 44 mg/dL — ABNORMAL HIGH (ref 8–23)
CO2: 29 mmol/L (ref 22–32)
Calcium: 7.8 mg/dL — ABNORMAL LOW (ref 8.9–10.3)
Chloride: 104 mmol/L (ref 98–111)
Creatinine, Ser: 1.47 mg/dL — ABNORMAL HIGH (ref 0.61–1.24)
GFR, Estimated: 51 mL/min — ABNORMAL LOW (ref >60)
Glucose, Bld: 117 mg/dL — ABNORMAL HIGH (ref 70–99)
Potassium: 3.6 mmol/L (ref 3.5–5.1)
Sodium: 143 mmol/L (ref 135–145)

## 2024-03-03 LAB — CBC
HCT: 24.6 % — ABNORMAL LOW (ref 39.0–52.0)
Hemoglobin: 7.9 g/dL — ABNORMAL LOW (ref 13.0–17.0)
MCH: 31.6 pg (ref 26.0–34.0)
MCHC: 32.1 g/dL (ref 30.0–36.0)
MCV: 98.4 fL (ref 80.0–100.0)
Platelets: 388 K/uL (ref 150–400)
RBC: 2.5 MIL/uL — ABNORMAL LOW (ref 4.22–5.81)
RDW: 17.2 % — ABNORMAL HIGH (ref 11.5–15.5)
WBC: 22.4 K/uL — ABNORMAL HIGH (ref 4.0–10.5)
nRBC: 1 % — ABNORMAL HIGH (ref 0.0–0.2)

## 2024-03-03 LAB — CULTURE, RESPIRATORY W GRAM STAIN
Culture: NORMAL
Gram Stain: NONE SEEN

## 2024-03-03 LAB — MAGNESIUM: Magnesium: 2.2 mg/dL (ref 1.7–2.4)

## 2024-03-03 LAB — GLUCOSE, CAPILLARY
Glucose-Capillary: 115 mg/dL — ABNORMAL HIGH (ref 70–99)
Glucose-Capillary: 113 mg/dL — ABNORMAL HIGH (ref 70–99)
Glucose-Capillary: 105 mg/dL — ABNORMAL HIGH (ref 70–99)
Glucose-Capillary: 179 mg/dL — ABNORMAL HIGH (ref 70–99)
Glucose-Capillary: 105 mg/dL — ABNORMAL HIGH (ref 70–99)
Glucose-Capillary: 86 mg/dL (ref 70–99)

## 2024-03-03 LAB — TRIGLYCERIDES: Triglycerides: 99 mg/dL (ref <150)

## 2024-03-03 MED ORDER — IPRATROPIUM-ALBUTEROL 0.5-2.5 (3) MG/3ML IN SOLN: 3.0000 mL | Freq: Four times a day (QID) | RESPIRATORY_TRACT | Status: DC | PRN

## 2024-03-03 MED ORDER — POTASSIUM CHLORIDE 20 MEQ PO PACK
40.0000 meq | PACK | Freq: Once | ORAL | Status: AC
  Administered 2024-03-03: 40 meq
  Filled 2024-03-03: qty 2

## 2024-03-03 MED ORDER — THIAMINE MONONITRATE 100 MG PO TABS
100.0000 mg | ORAL_TABLET | Freq: Every day | ORAL | Status: DC
  Administered 2024-03-03 – 2024-03-12 (×10): 100 mg
  Filled 2024-03-03 (×10): qty 1

## 2024-03-03 MED ORDER — SODIUM CHLORIDE 0.9% FLUSH: 10.0000 mL | INTRAVENOUS | Status: DC | PRN

## 2024-03-03 MED ORDER — SODIUM CHLORIDE 0.9% FLUSH
10.0000 mL | Freq: Two times a day (BID) | INTRAVENOUS | Status: DC
  Administered 2024-03-03 – 2024-03-04 (×3): 10 mL
  Administered 2024-03-04: 20 mL
  Administered 2024-03-05 – 2024-03-10 (×11): 10 mL
  Administered 2024-03-10: 20 mL
  Administered 2024-03-11 – 2024-03-12 (×3): 10 mL

## 2024-03-03 MED ORDER — SCOPOLAMINE 1 MG/3DAYS TD PT72
1.0000 | MEDICATED_PATCH | Freq: Once | TRANSDERMAL | Status: AC
  Administered 2024-03-03: 1.5 mg via TRANSDERMAL
  Filled 2024-03-03: qty 1

## 2024-03-03 MED ORDER — AMIODARONE HCL 200 MG PO TABS
400.0000 mg | ORAL_TABLET | Freq: Every day | ORAL | Status: DC
  Administered 2024-03-04 – 2024-03-12 (×9): 400 mg
  Filled 2024-03-03 (×9): qty 2

## 2024-03-03 NOTE — Progress Notes (Signed)
 Rounding Note   Patient Name: Miguel [name] Date of Encounter: 03/03/2024  [facility] Cardiologist: new to [facility]  Subjective We were asked to see the patient again for preoperative cardiovascular evaluation for PEG tube placement and tracheostomy. The patient's chart was reviewed.  The patient was briefly extubated but continued to be confused and then developed worsening anemia, leukocytosis and tachycardia.  He had to be intubated again.  He is not able to protect airways.   Scheduled Meds:  amiodarone   400 mg Per Tube BID   ARIPiprazole   30 mg Per Tube QHS   Chlorhexidine  Gluconate Cloth  6 each Topical Daily   diazepam   2.5 mg Per Tube Q6H   digoxin   0.125 mg Per NG tube Daily   feeding supplement (PROSource TF20)  60 mL Per Tube BID   free water   200 mL Per Tube Q4H   ipratropium  0.5 mg Nebulization Q6H   levalbuterol   1.25 mg Nebulization Q6H   metoprolol  tartrate  2.5 mg Intravenous Once   multivitamin with minerals  1 tablet Per Tube Daily   mouth rinse  15 mL Mouth Rinse Q2H   oxyCODONE   5 mg Per Tube BID   pantoprazole  (PROTONIX ) IV  40 mg Intravenous Q12H   polyethylene glycol  17 g Per Tube Daily   sodium chloride  flush  10-40 mL Intracatheter Q12H   sodium chloride  HYPERTONIC  4 mL Nebulization BID   thiamine  (VITAMIN B1) injection  100 mg Intravenous Daily   Continuous Infusions:   ceFAZolin  (ANCEF ) IV     dexmedetomidine  (PRECEDEX ) IV infusion Stopped (03/02/24 1024)   feeding supplement (VITAL 1.5 CAL) 40 mL/hr at 03/03/24 0400   norepinephrine  (LEVOPHED ) Adult infusion 3 mcg/min (03/03/24 0400)   piperacillin -tazobactam (ZOSYN )  IV 3.375 g (03/03/24 0523)   propofol  (DIPRIVAN ) infusion 20 mcg/kg/min (03/03/24 0400)   PRN Meds: bisacodyl , docusate sodium , fentaNYL  (SUBLIMAZE ) injection, mouth rinse, sodium chloride  flush   Vital Signs  Vitals:   03/03/24 0345 03/03/24 0346 03/03/24 0400 03/03/24 0415  BP: (!) 108/54  (!) 108/55 (!) 107/56   Pulse: 73  72 81  Resp: 18  18 18   Temp:   98.7 F (37.1 C)   TempSrc:   Oral   SpO2: 100% 100% 100% 99%  Weight:      Height:        Intake/Output Summary (Last 24 hours) at 03/03/2024 0533 Last data filed at 03/03/2024 0400 Gross per 24 hour  Intake 1615.35 ml  Output 1010 ml  Net 605.35 ml      03/01/2024    4:48 AM 02/29/2024    5:00 AM 02/28/2024    5:00 AM  Last 3 Weights  Weight (lbs) 208 lb 5.4 oz 199 lb 11.8 oz 205 lb 14.6 oz  Weight (kg) 94.5 kg 90.6 kg 93.4 kg      Telemetry Normal sinus rhythm- Personally Reviewed  ECG   - Personally Reviewed  Physical Exam  GEN: Intubated and sedated. Neck: No JVD Cardiac: RRR, no murmurs, rubs, or gallops.  Respiratory: Clear to auscultation bilaterally. GI: Soft, nontender, non-distended  MS: No edema; No deformity. Neuro: Full exam not performed Psych: Encephalopathy  Labs High Sensitivity Troponin:   Recent Labs  Lab 02/20/24 2330 02/21/24 0420 02/29/24 1433 02/29/24 1617 02/29/24 2120  TROPONINIHS 2,551* 2,304* 3,797* 4,321* 3,591*     Chemistry Recent Labs  Lab 02/27/24 0243 02/28/24 0306 02/29/24 0500 03/01/24 0455 03/02/24 0342 03/03/24 0310  NA 142  145 144 141 142 143  K 4.7 4.6 4.7 4.7 3.5 3.6  CL 101 104 102 99 104 104  CO2 34* 32 32 29 30 29   GLUCOSE 98 129* 111* 91 117* 117*  BUN 40* 54* 56* 56* 48* 44*  CREATININE 0.77 1.64* 1.77* 1.99* 1.78* 1.47*  CALCIUM  8.2* 8.1* 8.1* 8.0* 7.9* 7.8*  MG 2.2 2.2 2.4 2.6* 2.5* 2.2  PROT 6.0* 5.8* 5.9*  --   --   --   ALBUMIN 2.5* 2.3* 2.5* 2.6* 2.2*  --   AST 87* 76* 74*  --   --   --   ALT 333* 251* 221*  --   --   --   ALKPHOS 68 62 62  --   --   --   BILITOT 1.5* 1.2 1.4*  --   --   --   GFRNONAA >60 44* 41* 35* 40* 51*  ANIONGAP 7 9 10 13 8 10     Lipids  Recent Labs  Lab 03/03/24 0310  TRIG 99    Hematology Recent Labs  Lab 03/01/24 0455 03/02/24 0342 03/03/24 0310  WBC 18.1* 16.7* 22.4*  RBC 2.56* 2.57* 2.50*  HGB 8.0* 8.0* 7.9*   HCT 24.6* 24.8* 24.6*  MCV 96.1 96.5 98.4  MCH 31.3 31.1 31.6  MCHC 32.5 32.3 32.1  RDW 17.4* 17.3* 17.2*  PLT 364 372 388   Thyroid No results for input(s): TSH, FREET4 in the last 168 hours.  BNPNo results for input(s): BNP, PROBNP in the last 168 hours.  DDimer No results for input(s): DDIMER in the last 168 hours.   Radiology  CT ABDOMEN WO CONTRAST Result Date: 03/02/2024 CLINICAL DATA:  Cardiac arrest, cerebral infarction and assessment for possible percutaneous gastrostomy tube for nutritional needs. EXAM: CT ABDOMEN WITHOUT CONTRAST TECHNIQUE: Multidetector CT imaging of the abdomen was performed following the standard protocol without IV contrast. RADIATION DOSE REDUCTION: This exam was performed according to the departmental dose-optimization program which includes automated exposure control, adjustment of the mA and/or kV according to patient size and/or use of iterative reconstruction technique. COMPARISON:  CT chest, 03/01/2024 and multiple recent abdominal x-rays. FINDINGS: Lower chest: Bilateral small pleural effusions and dense bilateral lower lobe atelectasis. Hepatobiliary: No focal liver abnormality is seen. No gallstones, gallbladder wall thickening, or biliary dilatation. Pancreas: Unremarkable. No pancreatic ductal dilatation or surrounding inflammatory changes. Spleen: Normal in size without focal abnormality. Adrenals/Urinary Tract: Adrenal glands are unremarkable. Kidneys are normal, without renal calculi, focal lesion, or hydronephrosis. Stomach/Bowel: Feeding tube enters the stomach and terminates in the distal body/antrum. There is no hiatal hernia. A high stomach is completely posterior to the transverse colon and was in a similar position by prior CT of the chest and abdominal x-rays. There is no window for safe percutaneous gastrostomy tube placement. No free intraperitoneal air. Vascular/Lymphatic: Atherosclerosis of the abdominal aorta without aneurysm. No  lymphadenopathy. Other: Incidental partial visualization of a large mixed density left-sided retroperitoneal fluid collection asserting partial mass effect on the left kidney and continuing into the left pelvis measuring up to nearly 13 cm in maximum diameter. This involves part of the iliopsoas muscle and continues into the pelvis where inferior extent is not completely visualized. Given high density and low-density components, this most likely represents a left-sided retroperitoneal hemorrhage. Body wall anasarca present. Musculoskeletal: No acute or significant osseous findings. IMPRESSION: 1. Incidental partial visualization of a large mixed density left-sided retroperitoneal fluid collection exerting partial mass effect on the left kidney  and continuing into the left pelvis where inferior extent is not completely visualized. Maximum transverse diameter is approximately 13 cm. Given high density and low-density components, this most likely represents a left-sided retroperitoneal hemorrhage. 2. Bilateral small pleural effusions and dense bilateral lower lobe atelectasis. 3. A high stomach is completely posterior to the transverse colon and was in a similar position by prior CT of the chest and abdominal x-rays. There is no window for safe percutaneous gastrostomy tube placement and the patient is not a candidate for percutaneous gastrostomy. Showed long-term gastrostomy or jejunostomy feeding be necessary, recommend general surgical consultation. 4. Body wall anasarca. 5. Aortic atherosclerosis. Electronically Signed   By: Marcey Moan M.D.   On: 03/02/2024 16:57   CT CHEST WO CONTRAST Result Date: 03/01/2024 CLINICAL DATA:  Respiratory illness, nondiagnostic xray EXAM: CT CHEST WITHOUT CONTRAST TECHNIQUE: Multidetector CT imaging of the chest was performed following the standard protocol without IV contrast. RADIATION DOSE REDUCTION: This exam was performed according to the departmental dose-optimization  program which includes automated exposure control, adjustment of the mA and/or kV according to patient size and/or use of iterative reconstruction technique. COMPARISON:  Chest x-ray 03/01/2024, CT angio chest 02/20/2024 FINDINGS: Cardiovascular: Enlarged heart size. Cardiac changes suggestive of anemia. No significant pericardial effusion. The thoracic aorta is normal in caliber. Mild atherosclerotic plaque of the thoracic aorta. Four-vessel coronary artery calcifications. Mediastinum/Nodes: No gross hilar adenopathy, noting limited sensitivity for the detection of hilar adenopathy on this noncontrast study. No enlarged mediastinal or axillary lymph nodes. Thyroid gland, trachea, and esophagus demonstrate no significant findings. Lungs/Pleura: Least mild-to-moderate centrilobular emphysematous changes. Improved aeration of the lungs with persistent likely passive atelectasis of the bilateral upper lobes. Stable small to moderate right and interval increased small to moderate left pleural effusions. Passive atelectasis of bilateral lower lobes. Upper Abdomen: Trace to small volume simple free fluid ascites. Enteric 2 courses below the hemidiaphragm with tube noted within the gastric lumen and tip collimated view. Musculoskeletal: Bilateral gynecomastia. Redemonstration of acute to subacute bilateral anterior 3-6 rib fractures. Redemonstration of acute subacute nondisplaced inferior sternal body fracture (6:90). No suspicious lytic or blastic osseous lesions. No acute displaced fracture. IMPRESSION: 1. Improved aeration of the lungs with persistent likely passive atelectasis of the bilateral upper lobes. 2. Stable small to moderate right and interval increased in size small to moderate left pleural effusions. 3. Redemonstration of acute to subacute bilateral anterior 3-6 rib fractures. 4. Redemonstration of acute subacute nondisplaced inferior sternal body fracture. 5. Cardiomegaly. 6. Findings suggestive of anemia.  7. Trace to small volume ascites. Electronically Signed   By: Morgane  Naveau M.D.   On: 03/01/2024 22:38    Cardiac Studies   Patient Profile    Patient Profile   73 y.o. male with a history of biventricular failure, EF 20-25%, schizoaffective disorder, cocaine use presenting with out of hospital cardiac arrest.    Assessment & Plan    Preoperative cardiovascular evaluation for PEG tube placement and tracheostomy: Known chronic systolic heart failure with an EF of 20 to 25%. Not a candidate for cardiac catheterization or revascularization given his poor neurologic function postcardiac arrest. He is at moderate to high risk for cardiovascular complications with general anesthesia.  Risk-benefit ratio has to be addressed with the family.  There is no cardiac intervention that can be done to change his surgical risk.  Intermittent tachycardia, in the setting of significant agitation: Currently in sinus rhythm with oral amiodarone .  Chronic systolic heart failure: Not able  to tolerate heart failure medications given low blood pressure.  Currently on small dose digoxin .   For questions or updates, please contact [facility] Please consult [email or web address] for contact info under     Signed, Deatrice Cage, MD  03/02/2024, 5:33 PM

## 2024-03-03 NOTE — Progress Notes (Signed)
 Peripherally Inserted Central Catheter Placement  The IV Nurse has discussed with the patient and/or persons authorized to consent for the patient, the purpose of this procedure and the potential benefits and risks involved with this procedure.  The benefits include less needle sticks, lab draws from the catheter, and the patient may be discharged home with the catheter. Risks include, but not limited to, infection, bleeding, blood clot (thrombus formation), and puncture of an artery; nerve damage and irregular heartbeat and possibility to perform a PICC exchange if needed/ordered by physician.  Alternatives to this procedure were also discussed.  Bard Power PICC patient education guide, fact sheet on infection prevention and patient information card has been provided to patient /or left at bedside.  Cherene, brother consented via telephone and witnessed by Nena, Charity fundraiser.  PICC Placement Documentation  PICC Double Lumen 03/03/24 Right Brachial 1 cm 41 cm (Active)       Leita Shipper 03/03/2024, 1:04 PM

## 2024-03-03 NOTE — Progress Notes (Signed)
 Dr. Isaiah notified of patients blood sugar of 179.   At this time no sliding scale will be ordered due to patient having trach scheduled tomorrow at 7:30 am.   Patient will be NPO after mid-night. Bladder scanned patient 337 ML. Per Dr. Isaiah in and out cath patient.   In and out cath patient 400 ml recorded.  Per MD, bladder scan patient q4.

## 2024-03-03 NOTE — Progress Notes (Signed)
Placement.

## 2024-03-03 NOTE — Consult Note (Signed)
 Inpatient Consult Note  Attending:   Massie RAMAN. [name], MD, MBA, FARS   Otolaryngology-Head & Neck Surgery  This patient was seen today in at the request of No referring provider defined for this encounter. in regard to  Chief Complaint  Patient presents with   Cardiac Arrest     CHIEF COMPLAINT:   Chief Complaint  Patient presents with   Cardiac Arrest   Patient seen in consultation with Dr. Isaiah regarding ventilator dependence following cardiac arrest.     HPI:  The patient is a 73 y.o. old child who presents today with complaint of  Chief Complaint  Patient presents with   Cardiac Arrest    73 yo male who presented to [facility] ER [facility] EMS from jail on 06/26 post cardiac arrest.  Per ER notes pts cellmate heard the pt gurgling this morning.  He was found pulseless with initial cardiac rhythm asystole.  EMS administered 3 doses of epi/2 mg of narcan .  Following administration of narcan  ROSC achieved.  Estimated downtime 15 minutes.  EMS reported en route to the ER pt became agitated and attempted to remove Igel.  EMS administered versed .  Pt became pulseless again en route ACLS protocol initiated.   ED Course  Upon arrival to the ER pt remained pulseless in PEA.  ACLS protocol continued pt received: 2 mg of epi/4 mg of iv narcan /1 amp of bicarb/1 g of calcium .  Igel exchanged for an ETT with ongoing CPR (pt did not require RSI medications for intubation).  ROSC achieved with estimated downtime during 2nd cardiac arrest 15 minutes.  CTA Chest negative for PE.  Significant lab results were: Na+ 129/K+ 5.3/chloride 90/glucose 110/BUN 49/creatinine 2.22/calcium  7.5/mag 3.2/alk phos 134/AST 3,301/ALT 2,038/total bilirubin 5.5/troponin 344/lactic acid 8.1/urine drug screen negative/UA concerning for possible UTI.  Sepsis protocol initiated pt to receive: cefepime /metronidazole /vancomycin .  Pt found to be severely hypoglycemic CBG 17 requiring 1 amp of D50W.  Levophed  gtt initiated due to  hypotension.  PCCM team contacted for ICU admission.  Pt remains in police custody with bilateral ankle cuffs in place, and police officer at bedside.   ENT asked to evaluate on 03/02/24 for failure to extubate and need for trach for ongoing ventilatory support.  Labs yesterday with WBC = 16.7, HCT = 24.8, and platelets = 372.  INR = 1.4  No current blood thinners, Heparin , or Lovenox  per the ICU team.  Felt by ICU team to be stable for transport and the procedure (tracheotomy).     REVIEW OF SYSTEMS: The patient / family denies any recent history of fever, night sweats or weight loss, pain, cyanosis, clubbing or edema, respiratory distress, dizziness or imbalance.   PAST MEDICAL HISTORY: Past Medical History:  Diagnosis Date   Biventricular failure (HCC)    CVA (cerebral vascular accident) (HCC)    Polysubstance use disorder    Schizophrenia (HCC)    Ventilator dependence MI CVA   SURGICAL HISTORY: Past Surgical History:  Procedure Laterality Date   APPENDECTOMY       MEDICATIONS:  Current Facility-Administered Medications:    ARIPiprazole  (ABILIFY ) tablet 30 mg, 30 mg, Per Tube, QHS, Kasa, Kurian, MD, 30 mg at 03/02/24 2209   bisacodyl  (DULCOLAX) suppository 10 mg, 10 mg, Rectal, Daily PRN, Benjamin, Alzora L, NP   ceFAZolin  (ANCEF ) IVPB 2g/100 mL premix, 2 g, Intravenous, On Call to OR, Lucila Delon BROCKS, NP   Chlorhexidine  Gluconate Cloth 2 % PADS 6 each, 6 each, Topical, Daily, Assaker, Darrin, MD, 6  each at 03/02/24 2210   diazepam  (VALIUM ) tablet 2.5 mg, 2.5 mg, Per Tube, Q6H, Kasa, Kurian, MD, 2.5 mg at 03/03/24 1115   digoxin  (LANOXIN ) tablet 0.125 mg, 0.125 mg, Per NG tube, Daily, Assaker, Jean-Pierre, MD, 0.125 mg at 03/03/24 9082   docusate sodium  (COLACE) capsule 100 mg, 100 mg, Oral, BID PRN, Isadora Hose, MD   feeding supplement (PROSource TF20) liquid 60 mL, 60 mL, Per Tube, BID, Kasa, Kurian, MD, 60 mL at 03/03/24 0919   feeding supplement (VITAL  1.5 CAL) liquid 1,000 mL, 1,000 mL, Per Tube, Continuous, Isaiah Scrivener, MD, Last Rate: 50 mL/hr at 03/03/24 1133, Infusion Verify at 03/03/24 1133   fentaNYL  (SUBLIMAZE ) injection 50 mcg, 50 mcg, Intravenous, Q2H PRN, Ouma, Elizabeth Achieng, NP, 50 mcg at 03/02/24 1450   free water  200 mL, 200 mL, Per Tube, Q4H, Assaker, Jean-Pierre, MD, 200 mL at 03/03/24 1115   ipratropium-albuterol  (DUONEB) 0.5-2.5 (3) MG/3ML nebulizer solution 3 mL, 3 mL, Nebulization, Q6H PRN, Kasa, Kurian, MD   multivitamin with minerals tablet 1 tablet, 1 tablet, Per Tube, Daily, Dgayli, Hose, MD, 1 tablet at 03/03/24 0917   norepinephrine  (LEVOPHED ) 16 mg in (0.064 mg/mL) premix infusion, 0-40 mcg/min, Intravenous, Titrated, Tukov-Yual, Magdalene S, NP, Last Rate: 0.94 mL/hr at 03/03/24 1133, 1 mcg/min at 03/03/24 1133   Oral care mouth rinse, 15 mL, Mouth Rinse, Q2H, Ouma, Almarie Bake, NP, 15 mL at 03/03/24 1116   Oral care mouth rinse, 15 mL, Mouth Rinse, PRN, Ouma, Elizabeth Achieng, NP   oxyCODONE  (Oxy IR/ROXICODONE ) immediate release tablet 5 mg, 5 mg, Per Tube, BID, Kasa, Kurian, MD, 5 mg at 03/03/24 0916   pantoprazole  (PROTONIX ) injection 40 mg, 40 mg, Intravenous, Q12H, Keene, Jeremiah D, NP, 40 mg at 03/03/24 9082   polyethylene glycol (MIRALAX  / GLYCOLAX ) packet 17 g, 17 g, Per Tube, Daily, Assaker, Darrin, MD, 17 g at 03/03/24 0916   propofol  (DIPRIVAN ) 1000 MG/100ML infusion, 0-80 mcg/kg/min, Intravenous, Titrated, Isaiah Scrivener, MD, Last Rate: 11.34 mL/hr at 03/03/24 1133, 20 mcg/kg/min at 03/03/24 1133   scopolamine  (TRANSDERM-SCOP) 1 MG/3DAYS 1.5 mg, 1 patch, Transdermal, Once, Isaiah Scrivener, MD   sodium chloride  flush (NS) 0.9 % injection 10-40 mL, 10-40 mL, Intracatheter, Q12H, Dgayli, Khabib, MD, 10 mL at 03/03/24 0917   sodium chloride  flush (NS) 0.9 % injection 10-40 mL, 10-40 mL, Intracatheter, PRN, Isadora, Khabib, MD   sodium chloride  HYPERTONIC 3 % nebulizer solution 4 mL, 4 mL,  Nebulization, BID, Tukov-Yual, Magdalene S, NP, 4 mL at 03/03/24 0803   thiamine  (VITAMIN B1) tablet 100 mg, 100 mg, Per Tube, Daily, Clair Marolyn NOVAK, RPH, 100 mg at 03/03/24 9083   ALLERGIES: No Known Allergies   BIRTH HX:  Term - no complications.   SOCIAL HISTORY: Social History   Socioeconomic History   Marital status: Divorced    Spouse name: Not on file   Number of children: Not on file   Years of education: Not on file   Highest education level: Not on file  Occupational History   Not on file  Tobacco Use   Smoking status: Never   Smokeless tobacco: Never  Vaping Use   Vaping status: Never Used  Substance and Sexual Activity   Alcohol use: No   Drug use: No   Sexual activity: Not on file  Other Topics Concern   Not on file  Social History Narrative   Not on file   Social Drivers of Health   Financial Resource Strain: Not  on file  Food Insecurity: Patient Unable To Answer (02/21/2024)   Hunger Vital Sign    Worried About Running Out of Food in the Last Year: Patient unable to answer    Ran Out of Food in the Last Year: Patient unable to answer  Transportation Needs: Patient Unable To Answer (02/21/2024)   PRAPARE - Transportation    Lack of Transportation (Medical): Patient unable to answer    Lack of Transportation (Non-Medical): Patient unable to answer  Physical Activity: Not on file  Stress: Not on file  Social Connections: Patient Unable To Answer (02/21/2024)   Social Connection and Isolation Panel    Frequency of Communication with Friends and Family: Patient unable to answer    Frequency of Social Gatherings with Friends and Family: Patient unable to answer    Attends Religious Services: Patient unable to answer    Active Member of Clubs or Organizations: Patient unable to answer    Attends Banker Meetings: Patient unable to answer    Marital Status: Patient unable to answer  Intimate Partner Violence: Patient Unable To Answer  (02/21/2024)   Humiliation, Afraid, Rape, and Kick questionnaire    Fear of Current or Ex-Partner: Patient unable to answer    Emotionally Abused: Patient unable to answer    Physically Abused: Patient unable to answer    Sexually Abused: Patient unable to answer     FAMILY HISTORY: Family History  Family history unknown: Yes     PHYSICAL EXAM: BP 111/84   Pulse 70   Temp 98.8 F (37.1 C)   Resp 19   Ht 6' (1.829 m)   Wt 96.5 kg   SpO2 98%   BMI 28.85 kg/m    Constitutional - please see medical record for recorded vital signs.  Intubated and sedated with regular ETT.  Head and Face - inspection of the head and face revealed the scalp to be normal and skin without scars, lesions or masses.  There was no evidence of peri-orbital edema or erythema, or palpable sinus tenderness.  There was no evidence of salivary gland tenderness or enlargement.  Facial strength appeared intact and symmetric bilaterally.  Eyes - pupils were equal, round and reactive to light.  Extra-ocular movements were intact and vision was grossly normal bilaterally.  Ears - The external ears were normal in appearance.  Otoscopic exam revealed the external auditory canals to be patent.  The tympanic membranes were clear bilaterally, with normal mobility on pneumatic otoscopy.  There was no evidence of middle ear fluid, perforation, drainage or acute infection.  Hearing was grossly intact and speech reception thresholds were grossly within normal limits.  Nose - The external nose was normal in appearance.  The nasal dorsum was midline.  Exam of the anterior nasal cavity revealed no evidence of purulent drainage, polyps or mass or mucosal lesions.  The septum was midline and the inferior turbinate were normal in size and appearance.    Oral cavity - The mucosa of the oral cavity and oropharynx was normal without mass or mucosal lesion, erythema or exudate.  The posterior pharyngeal wall, soft and hard palate and  tongue all appeared normal.  There was no evidence of significant tonsillar hypertrophy.  Neck - The neck was nontender and without palpable adenopathy, crepitus or mass lesion.  The trachea was midline.  The thyroid exam revealed no evidence of enlargement, tenderness or mass lesion.  No scars or evidence of prior anterior neck surgery.  Repiratory - The  patient was without respiratory distress, stridor or retractions.  Breath sounds were clear bilaterally.  Cardiovascular - Cardiovascular exam revealed a regular rate and rhythm with no evidence of murmur.  Extremeties without evidence of cyanosis, clubbing or edema.  Lymphatic System - there was no evidence of palpable adenopathy in the neck, supraclavicular fossae or axillae.  Neurologic - Cranial nerves II through XII were grossly intact bilaterally.  In particular the VII cranial nerve was intact and symmetric bilaterally.   IMAGING:   AUDIOLOGY:   Medical Decision Making  ASSESSMENT:  Failed extubation - need for ongoing ventilatory support.  ENT asked to evaluate on 03/02/24 for failure to extubate and need for trach for ongoing ventilatory support.  Labs yesterday with WBC = 16.7, HCT = 24.8, and platelets = 372.  INR = 1.4  No current blood thinners, Heparin , or Lovenox  per the ICU team.  Felt by ICU team to be stable for transport and the procedure (tracheotomy).     PLAN:  Plan to OR in the near future for tracheotomy and tracheostomy tube placement, likely a #6 cuffed Shiley tracheostomy tube.  I discussed the risks, benefits and options for the proposed procedure with the family (brother) at length today - they understand and agree to proceed.  This included a discussion of the possibility of bleeding, infection, need for further surgery, pneumothorax, airway injury, cardiopulmonary compromise and death.  They understand and agree to proceed.     Massie RAMAN. [name], MD, MBA, [facility] Otolaryngology-Head & Neck Surgery [place]  ENT [phone]

## 2024-03-03 NOTE — H&P (View-Only) (Signed)
 Inpatient Consult Note  Attending:   Massie RAMAN. [name], MD, MBA, FARS   Otolaryngology-Head & Neck Surgery  This patient was seen today in at the request of No referring provider defined for this encounter. in regard to  Chief Complaint  Patient presents with   Cardiac Arrest     CHIEF COMPLAINT:   Chief Complaint  Patient presents with   Cardiac Arrest   Patient seen in consultation with Dr. Isaiah regarding ventilator dependence following cardiac arrest.     HPI:  The patient is a 73 y.o. old child who presents today with complaint of  Chief Complaint  Patient presents with   Cardiac Arrest    73 yo male who presented to [facility] ER [facility] EMS from jail on 06[place]26 post cardiac arrest.  Per ER notes pts cellmate heard the pt gurgling this morning.  He was found pulseless with initial cardiac rhythm asystole.  EMS administered 3 doses of epi/2 mg of narcan .  Following administration of narcan  ROSC achieved.  Estimated downtime 15 minutes.  EMS reported en route to the ER pt became agitated and attempted to remove Igel.  EMS administered versed .  Pt became pulseless again en route ACLS protocol initiated.   ED Course  Upon arrival to the ER pt remained pulseless in PEA.  ACLS protocol continued pt received: 2 mg of epi/4 mg of iv narcan /1 amp of bicarb/1 g of calcium .  Igel exchanged for an ETT with ongoing CPR (pt did not require RSI medications for intubation).  ROSC achieved with estimated downtime during 2nd cardiac arrest 15 minutes.  CTA Chest negative for PE.  Significant lab results were: Na+ 129/K+ 5.3/chloride 90/glucose 110/BUN 49/creatinine 2.22/calcium  7.5/mag 3.2/alk phos 134/AST 3,301/ALT 2,038/total bilirubin 5.5/troponin 344/lactic acid 8.1/urine drug screen negative/UA concerning for possible UTI.  Sepsis protocol initiated pt to receive: cefepime /metronidazole /vancomycin .  Pt found to be severely hypoglycemic CBG 17 requiring 1 amp of D50W.  Levophed  gtt initiated due to  hypotension.  PCCM team contacted for ICU admission.  Pt remains in police custody with bilateral ankle cuffs in place, and police officer at bedside.   ENT asked to evaluate on 03/02/24 for failure to extubate and need for trach for ongoing ventilatory support.  Labs yesterday with WBC = 16.7, HCT = 24.8, and platelets = 372.  INR = 1.4  No current blood thinners, Heparin , or Lovenox  per the ICU team.  Felt by ICU team to be stable for transport and the procedure (tracheotomy).     REVIEW OF SYSTEMS: The patient / family denies any recent history of fever, night sweats or weight loss, pain, cyanosis, clubbing or edema, respiratory distress, dizziness or imbalance.   PAST MEDICAL HISTORY: Past Medical History:  Diagnosis Date   Biventricular failure (HCC)    CVA (cerebral vascular accident) (HCC)    Polysubstance use disorder    Schizophrenia (HCC)    Ventilator dependence MI CVA   SURGICAL HISTORY: Past Surgical History:  Procedure Laterality Date   APPENDECTOMY       MEDICATIONS:  Current Facility-Administered Medications:    ARIPiprazole  (ABILIFY ) tablet 30 mg, 30 mg, Per Tube, QHS, Kasa, Kurian, MD, 30 mg at 03/02/24 2209   bisacodyl  (DULCOLAX) suppository 10 mg, 10 mg, Rectal, Daily PRN, Benjamin, Alzora L, NP   ceFAZolin  (ANCEF ) IVPB 2g/100 mL premix, 2 g, Intravenous, On Call to OR, Lucila Delon BROCKS, NP   Chlorhexidine  Gluconate Cloth 2 % PADS 6 each, 6 each, Topical, Daily, Assaker, Darrin, MD, 6  each at 03/02/24 2210   diazepam  (VALIUM ) tablet 2.5 mg, 2.5 mg, Per Tube, Q6H, Kasa, Kurian, MD, 2.5 mg at 03/03/24 1115   digoxin  (LANOXIN ) tablet 0.125 mg, 0.125 mg, Per NG tube, Daily, Assaker, Jean-Pierre, MD, 0.125 mg at 03/03/24 9082   docusate sodium  (COLACE) capsule 100 mg, 100 mg, Oral, BID PRN, Isadora Hose, MD   feeding supplement (PROSource TF20) liquid 60 mL, 60 mL, Per Tube, BID, Kasa, Kurian, MD, 60 mL at 03/03/24 0919   feeding supplement (VITAL  1.5 CAL) liquid 1,000 mL, 1,000 mL, Per Tube, Continuous, Isaiah Scrivener, MD, Last Rate: 50 mL/hr at 03/03/24 1133, Infusion Verify at 03/03/24 1133   fentaNYL  (SUBLIMAZE ) injection 50 mcg, 50 mcg, Intravenous, Q2H PRN, Ouma, Elizabeth Achieng, NP, 50 mcg at 03/02/24 1450   free water  200 mL, 200 mL, Per Tube, Q4H, Assaker, Jean-Pierre, MD, 200 mL at 03/03/24 1115   ipratropium-albuterol  (DUONEB) 0.5-2.5 (3) MG/3ML nebulizer solution 3 mL, 3 mL, Nebulization, Q6H PRN, Kasa, Kurian, MD   multivitamin with minerals tablet 1 tablet, 1 tablet, Per Tube, Daily, Dgayli, Hose, MD, 1 tablet at 03/03/24 0917   norepinephrine  (LEVOPHED ) 16 mg in (0.064 mg/mL) premix infusion, 0-40 mcg/min, Intravenous, Titrated, Tukov-Yual, Magdalene S, NP, Last Rate: 0.94 mL/hr at 03/03/24 1133, 1 mcg/min at 03/03/24 1133   Oral care mouth rinse, 15 mL, Mouth Rinse, Q2H, Ouma, Almarie Bake, NP, 15 mL at 03/03/24 1116   Oral care mouth rinse, 15 mL, Mouth Rinse, PRN, Ouma, Elizabeth Achieng, NP   oxyCODONE  (Oxy IR/ROXICODONE ) immediate release tablet 5 mg, 5 mg, Per Tube, BID, Kasa, Kurian, MD, 5 mg at 03/03/24 0916   pantoprazole  (PROTONIX ) injection 40 mg, 40 mg, Intravenous, Q12H, Keene, Jeremiah D, NP, 40 mg at 03/03/24 9082   polyethylene glycol (MIRALAX  / GLYCOLAX ) packet 17 g, 17 g, Per Tube, Daily, Assaker, Darrin, MD, 17 g at 03/03/24 0916   propofol  (DIPRIVAN ) 1000 MG/100ML infusion, 0-80 mcg/kg/min, Intravenous, Titrated, Isaiah Scrivener, MD, Last Rate: 11.34 mL/hr at 03/03/24 1133, 20 mcg/kg/min at 03/03/24 1133   scopolamine  (TRANSDERM-SCOP) 1 MG/3DAYS 1.5 mg, 1 patch, Transdermal, Once, Isaiah Scrivener, MD   sodium chloride  flush (NS) 0.9 % injection 10-40 mL, 10-40 mL, Intracatheter, Q12H, Dgayli, Khabib, MD, 10 mL at 03/03/24 0917   sodium chloride  flush (NS) 0.9 % injection 10-40 mL, 10-40 mL, Intracatheter, PRN, Isadora, Khabib, MD   sodium chloride  HYPERTONIC 3 % nebulizer solution 4 mL, 4 mL,  Nebulization, BID, Tukov-Yual, Magdalene S, NP, 4 mL at 03/03/24 0803   thiamine  (VITAMIN B1) tablet 100 mg, 100 mg, Per Tube, Daily, Clair Marolyn NOVAK, RPH, 100 mg at 03/03/24 9083   ALLERGIES: No Known Allergies   BIRTH HX:  Term - no complications.   SOCIAL HISTORY: Social History   Socioeconomic History   Marital status: Divorced    Spouse name: Not on file   Number of children: Not on file   Years of education: Not on file   Highest education level: Not on file  Occupational History   Not on file  Tobacco Use   Smoking status: Never   Smokeless tobacco: Never  Vaping Use   Vaping status: Never Used  Substance and Sexual Activity   Alcohol use: No   Drug use: No   Sexual activity: Not on file  Other Topics Concern   Not on file  Social History Narrative   Not on file   Social Drivers of Health   Financial Resource Strain: Not  on file  Food Insecurity: Patient Unable To Answer (02/21/2024)   Hunger Vital Sign    Worried About Running Out of Food in the Last Year: Patient unable to answer    Ran Out of Food in the Last Year: Patient unable to answer  Transportation Needs: Patient Unable To Answer (02/21/2024)   PRAPARE - Transportation    Lack of Transportation (Medical): Patient unable to answer    Lack of Transportation (Non-Medical): Patient unable to answer  Physical Activity: Not on file  Stress: Not on file  Social Connections: Patient Unable To Answer (02/21/2024)   Social Connection and Isolation Panel    Frequency of Communication with Friends and Family: Patient unable to answer    Frequency of Social Gatherings with Friends and Family: Patient unable to answer    Attends Religious Services: Patient unable to answer    Active Member of Clubs or Organizations: Patient unable to answer    Attends Banker Meetings: Patient unable to answer    Marital Status: Patient unable to answer  Intimate Partner Violence: Patient Unable To Answer  (02/21/2024)   Humiliation, Afraid, Rape, and Kick questionnaire    Fear of Current or Ex-Partner: Patient unable to answer    Emotionally Abused: Patient unable to answer    Physically Abused: Patient unable to answer    Sexually Abused: Patient unable to answer     FAMILY HISTORY: Family History  Family history unknown: Yes     PHYSICAL EXAM: BP 111/84   Pulse 70   Temp 98.8 F (37.1 C)   Resp 19   Ht 6' (1.829 m)   Wt 96.5 kg   SpO2 98%   BMI 28.85 kg/m    Constitutional - please see medical record for recorded vital signs.  Intubated and sedated with regular ETT.  Head and Face - inspection of the head and face revealed the scalp to be normal and skin without scars, lesions or masses.  There was no evidence of peri-orbital edema or erythema, or palpable sinus tenderness.  There was no evidence of salivary gland tenderness or enlargement.  Facial strength appeared intact and symmetric bilaterally.  Eyes - pupils were equal, round and reactive to light.  Extra-ocular movements were intact and vision was grossly normal bilaterally.  Ears - The external ears were normal in appearance.  Otoscopic exam revealed the external auditory canals to be patent.  The tympanic membranes were clear bilaterally, with normal mobility on pneumatic otoscopy.  There was no evidence of middle ear fluid, perforation, drainage or acute infection.  Hearing was grossly intact and speech reception thresholds were grossly within normal limits.  Nose - The external nose was normal in appearance.  The nasal dorsum was midline.  Exam of the anterior nasal cavity revealed no evidence of purulent drainage, polyps or mass or mucosal lesions.  The septum was midline and the inferior turbinate were normal in size and appearance.    Oral cavity - The mucosa of the oral cavity and oropharynx was normal without mass or mucosal lesion, erythema or exudate.  The posterior pharyngeal wall, soft and hard palate and  tongue all appeared normal.  There was no evidence of significant tonsillar hypertrophy.  Neck - The neck was nontender and without palpable adenopathy, crepitus or mass lesion.  The trachea was midline.  The thyroid exam revealed no evidence of enlargement, tenderness or mass lesion.  No scars or evidence of prior anterior neck surgery.  Repiratory - The  patient was without respiratory distress, stridor or retractions.  Breath sounds were clear bilaterally.  Cardiovascular - Cardiovascular exam revealed a regular rate and rhythm with no evidence of murmur.  Extremeties without evidence of cyanosis, clubbing or edema.  Lymphatic System - there was no evidence of palpable adenopathy in the neck, supraclavicular fossae or axillae.  Neurologic - Cranial nerves II through XII were grossly intact bilaterally.  In particular the VII cranial nerve was intact and symmetric bilaterally.   IMAGING:   AUDIOLOGY:   Medical Decision Making  ASSESSMENT:  Failed extubation - need for ongoing ventilatory support.  ENT asked to evaluate on 03/02/24 for failure to extubate and need for trach for ongoing ventilatory support.  Labs yesterday with WBC = 16.7, HCT = 24.8, and platelets = 372.  INR = 1.4  No current blood thinners, Heparin , or Lovenox  per the ICU team.  Felt by ICU team to be stable for transport and the procedure (tracheotomy).     PLAN:  Plan to OR in the near future for tracheotomy and tracheostomy tube placement, likely a #6 cuffed Shiley tracheostomy tube.  I discussed the risks, benefits and options for the proposed procedure with the family (brother) at length today - they understand and agree to proceed.  This included a discussion of the possibility of bleeding, infection, need for further surgery, pneumothorax, airway injury, cardiopulmonary compromise and death.  They understand and agree to proceed.     Massie RAMAN. [name], MD, MBA, [facility] Otolaryngology-Head & Neck Surgery [place]  ENT [phone]

## 2024-03-03 NOTE — Plan of Care (Signed)
 Patient sedated on vent. Per ENT he will be taking patient tomorrow 03/04/2024 at 7:30 for trach placement. Foley removed. Bladder scanned patient and I&O per MD orders. Central line removed and PICC line placed. Continue to assess.

## 2024-03-03 NOTE — Progress Notes (Signed)
 PHARMACY CONSULT NOTE  Pharmacy Consult for Electrolyte Monitoring and Replacement   Recent Labs: Potassium (mmol/L)  Date Value  03/03/2024 3.6  09/15/2012 4.4   Magnesium  (mg/dL)  Date Value  92/91/7974 2.2   Calcium  (mg/dL)  Date Value  92/91/7974 7.8 (L)   Calcium , Total (mg/dL)  Date Value  98/79/7985 9.4   Albumin (g/dL)  Date Value  92/92/7974 2.2 (L)  09/15/2012 4.2   Phosphorus (mg/dL)  Date Value  92/92/7974 2.5   Sodium (mmol/L)  Date Value  03/03/2024 143  09/15/2012 146 (H)   Assessment: 73 y/o male with h/o CHF, dilated cardiomyopathy, substance abuse, schizophrenia, CVA, depression, HCV, homelessness and recently in jail who is admitted with PEA arrest, AKI, CHF with bilateral pleural effusions, thrombus, shock, H influenza and strep pneumoniae and sepsis. Pharmacy is asked to follow and replace electrolytes while in CCU  Goal of Therapy:  Potassium 4.0 - 5.1 mmol/L Magnesium  2.0 - 2.4 mg/dL All Other Electrolytes WNL  Plan:  --K 3.6, Kcl 40 mEq per tube x 1 dose --Labs tomorrow  Miguel [name] 03/03/2024 7:46 AM

## 2024-03-03 NOTE — Progress Notes (Signed)
 NAME:  Miguel [name], MRN:  [id number], DOB:  Oct 19, 1950, LOS: 12 ADMISSION DATE:  02/20/2024  History of Present Illness:  73 yo male who presented to [facility] ER via EMS from jail on 06/26 post cardiac arrest.  Per ER notes pts cellmate heard the pt gurgling this morning.  He was found pulseless with initial cardiac rhythm asystole.  EMS administered 3 doses of epi/2 mg of narcan .  Following administration of narcan  ROSC achieved.  Estimated downtime 15 minutes.  EMS reported en route to the ER pt became agitated and attempted to remove Igel.  EMS administered versed .  Pt became pulseless again en route ACLS protocol initiated.   ED Course  Upon arrival to the ER pt remained pulseless in PEA.  ACLS protocol continued pt received: 2 mg of epi/4 mg of iv narcan /1 amp of bicarb/1 g of calcium .  Igel exchanged for an ETT with ongoing CPR (pt did not require RSI medications for intubation).  ROSC achieved with estimated downtime during 2nd cardiac arrest 15 minutes.  CTA Chest negative for PE.  Significant lab results were: Na+ 129/K+ 5.3/chloride 90/glucose 110/BUN 49/creatinine 2.22/calcium  7.5/mag 3.2/alk phos 134/AST 3,301/ALT 2,038/total bilirubin 5.5/troponin 344/lactic acid 8.1/urine drug screen negative/UA concerning for possible UTI.  Sepsis protocol initiated pt to receive: cefepime /metronidazole /vancomycin .  Pt found to be severely hypoglycemic CBG 17 requiring 1 amp of D50W.  Levophed  gtt initiated due to hypotension.  PCCM team contacted for ICU admission.  Pt remains in police custody with bilateral ankle cuffs in place, and police officer at bedside.    PCCM asked to admit for further workup and treatment.   Please see Significant Hospital Events section below for full detailed hospital course.  Pertinent  Medical History  Schizophrenia  Depression Current Smoker  Chronic Systolic CHF (Echo 09/11/22: EF <20%, grade II diastolic dysfunction, trivial pericardial effusion, mild mitral  valve regurgitation, trivial tricuspid valve regurgitation, trivial pulmonic valve regurgitation) CVA with aphasia  Significant Hospital Events: Including procedures, antibiotic start and stop dates in addition to other pertinent events    06/26: Pt admitted post cardiac arrest mechanically intubated with possible cardiogenic/possible septic shock with multiorgan failure requiring levophed  and dobutamine  gtts 06/27: on minimal sedation, discontinued this AM. Off nor-epi, continued on dobutamine  06/28: decreased dobutamine  to 2.5, remains off sedation 06/29: arouses to tactile and verbal stimuli, remains disoriented. Failed SBT this AM. Dobutamine  increased to 5 mcg overnight due to bradycardia 06/30: Pt remains mechanically intubated, however neuro exam precludes extubation.  He has not received sedation since 06/27. Dobutamine  discontinued  06/30: MRI Brain Since the previous MRI, the patient has developed chronic encephalomalacia changes within the right medial occipital lobe compatible with an interval PCA distribution infarct. Interval development of encephalomalacia in area of previously demonstrated left MCA distribution infarct, which was acute on the previous MRI. 07/01: Overnight pt remained in atrial flutter with rvr hr 140 to 150's with possible ST elevation despite iv metoprolol .  Cardiologist Dr Darron notified recommended amiodarone  bolus followed by amiodarone  gtt for rate control.  However, this morning while on amiodarone  gtt pt became severely bradycardic hr as low as 27 bpm with associated hypotension, amiodarone  gtt discontinued he is now requiring low dose levophed  gtt.  Self extubated.  Delirious requiring Precedex . 07/02: No significant events noted overnight.  Remains on Precedex  due to delirium, currently protecting his airway.  Afebrile, hemodynamically stable, Levophed  weaned off, Bradycardia resolved, no reports of tachyarrhythmias.  Dobutamine  weaned to 2.5 and Coox remains  stable at 83, will d/c Dobutamine .  Speech/PT/OT consulted.  Failed speech evaluation, plan to place Dobhoff as able, remove foley. 07/03: No significant events noted overnight. Remains on low dose precedex  (0.4 mcg) due to delirium.  Afebrile, hemodynamically stable, no vasopressors.  Coox 73 and Lactic 1.1 OFF of inotropes and OFF Levophed .  Dobhoff with difficultly functioning, will exchange it to a regular NGT for feeds and med administration.  Gentle diuresis with 20 mg IV Lasix  x1 dose.  Hgb decreased to 9 from 11.7, no signs of bleeding, will d/c Heparin  gtt given no thrombus on repeat Echo. 07/04: Overnight Levophed  reinitiated, Hgb dropped to 6.4 (no reports of bleeding from nursing, abdominal exam is benign), ordered for 1 unit pRBC's, Lovenox  d/c,  increase PPI to BID dosing.  With worsening Leukocytosis up to 15.5 from 12.8 and increased FiO2 requirements to up 10 L (overnight deep Nasotracheal suctioning yielded copious amounts of thick secretions) , will check PCT and start empiric Zosyn  for aspiration coverage.  WOB is improved from yesterday, on low dose Precedex  but more interactive today.  Again unable to complete speech evaluation, will attempt NG tube placement again. 07/05: Remains confused and delirious. HR in the 140s; unresponsive to amiodarone  bolus and infusion. Transfused 1 unit of PRBCs for Hg of 7.1. Started on valium  for agitation. 07/06: Intubated overnight due to worsening mental status and thick copious secretions.  7/7 remains on vent, plan for [facility] 7/8 plan  for trach, remains on vent  Interim History / Subjective:  Remains critically ill Remains intubated Requires VENT support for survival   Vent Mode: PRVC FiO2 (%):  [35 %] 35 % Set Rate:  [18 bmp] 18 bmp Vt Set:  [500 mL] 500 mL PEEP:  [5 cmH20] 5 cmH20 Plateau Pressure:  [17 cmH20-18 cmH20] 18 cmH20  Objective    Blood pressure (!) 108/53, pulse 66, temperature 98.4 F (36.9 C), resp. rate 19, height 6'  (1.829 m), weight 96.5 kg, SpO2 100%.    Vent Mode: PRVC FiO2 (%):  [35 %] 35 % Set Rate:  [18 bmp] 18 bmp Vt Set:  [500 mL] 500 mL PEEP:  [5 cmH20] 5 cmH20 Plateau Pressure:  [17 cmH20-18 cmH20] 18 cmH20   Intake/Output Summary (Last 24 hours) at 03/03/2024 1601 Last data filed at 03/03/2024 1556 Gross per 24 hour  Intake 1990.99 ml  Output 1755 ml  Net 235.99 ml   Filed Weights   02/29/24 0500 03/01/24 0448 03/03/24 0500  Weight: 90.6 kg 94.5 kg 96.5 kg        REVIEW OF SYSTEMS  PATIENT IS UNABLE TO PROVIDE COMPLETE REVIEW OF SYSTEMS DUE TO SEVERE CRITICAL ILLNESS   PHYSICAL EXAMINATION:  GENERAL:critically ill appearing, +resp distress EYES: Pupils equal, round, reactive to light.  No scleral icterus.  MOUTH: Moist mucosal membrane. INTUBATED NECK: Supple.  PULMONARY: Lungs clear to auscultation, +rhonchi, +wheezing CARDIOVASCULAR: S1 and S2.  Regular rate and rhythm GASTROINTESTINAL: Soft, nontender, -distended. Positive bowel sounds.  MUSCULOSKELETAL: No swelling, clubbing, or edema.  NEUROLOGIC: obtunded,sedated SKIN:normal, warm to touch, Capillary refill delayed  Pulses present bilaterally       Latest Ref Rng & Units 03/03/2024    3:10 AM 03/02/2024    3:42 AM 03/01/2024    4:55 AM  CBC  WBC 4.0 - 10.5 K/uL 22.4  16.7  18.1   Hemoglobin 13.0 - 17.0 g/dL 7.9  8.0  8.0   Hematocrit 39.0 - 52.0 % 24.6  24.8  24.6  Platelets 150 - 400 K/uL 388  372  364        Latest Ref Rng & Units 03/03/2024    3:10 AM 03/02/2024    3:42 AM 03/01/2024    4:55 AM  CMP  Glucose 70 - 99 mg/dL 882  882  91   BUN 8 - 23 mg/dL 44  48  56   Creatinine 0.61 - 1.24 mg/dL 8.52  8.21  8.00   Sodium 135 - 145 mmol/L 143  142  141   Potassium 3.5 - 5.1 mmol/L 3.6  3.5  4.7   Chloride 98 - 111 mmol/L 104  104  99   CO2 22 - 32 mmol/L 29  30  29    Calcium  8.9 - 10.3 mg/dL 7.8  7.9  8.0      ASSESSMENT / PLAN:  73 year old African-American male admitted to the intensive care unit for  acute cardiac arrest likely due to severe decompensated congestive heart failure EF of 20% leading to mechanical ventilation in the setting of delirious possibly anoxic brain injury with failure to wean from the ventilator signs symptoms of aspiration  Severe ACUTE Hypoxic and Hypercapnic Respiratory Failure -continue Mechanical Ventilator support -Wean Fio2 and PEEP as tolerated -VAP/VENT bundle implementation - Wean PEEP & FiO2 as tolerated, maintain SpO2 > 88% - Head of bed elevated 30 degrees, VAP protocol in place - Plateau pressures less than 30 cm H20  - Intermittent chest x-ray & ABG PRN - Ensure adequate pulmonary hygiene  Plan for [facility]  CARDIOVASCULAR Out of hospital cardiac arrest s/p intubation and MV 06/26 s/p extubation 07/01. BiV faiure with Echocardiogram with new reduced EF 20% w/ regional wall motion abnormalities noted. RV systolic function severely reduced.  New onset A.flutter  Mixed shock - cardiogenic and possibly distributive from recurrent sepsis  -Unable to anticoagulate due acute blood loss anemia -Continue amiodarone  infusion -Hemodynamic monitoring per ICU protocol - Pressors as needed to keep mean arterial blood pressure 65 and above Follow up cardiology consultation  ACUTE KIDNEY INJURY/Renal Failure -continue Foley Catheter-assess need -Avoid nephrotoxic agents -Follow urine output, BMP -Ensure adequate renal perfusion, optimize oxygenation -Renal dose medications   Intake/Output Summary (Last 24 hours) at 03/03/2024 1604 Last data filed at 03/03/2024 1556 Gross per 24 hour  Intake 1990.99 ml  Output 1755 ml  Net 235.99 ml        Latest Ref Rng & Units 03/03/2024    3:10 AM 03/02/2024    3:42 AM 03/01/2024    4:55 AM  BMP  Glucose 70 - 99 mg/dL 882  882  91   BUN 8 - 23 mg/dL 44  48  56   Creatinine 0.61 - 1.24 mg/dL 8.52  8.21  8.00   Sodium 135 - 145 mmol/L 143  142  141   Potassium 3.5 - 5.1 mmol/L 3.6  3.5  4.7   Chloride 98 - 111 mmol/L  104  104  99   CO2 22 - 32 mmol/L 29  30  29    Calcium  8.9 - 10.3 mg/dL 7.8  7.9  8.0      GASTROINTESTINAL Questionable GI blood loss-patient's hemoglobin dropped to 7.1 requiring transfusion of 1 unit of packed red blood cells.  Hemoglobin is back to 8.0 this morning.   -Continue to trend hemoglobin and transfuse as needed - Will consider reconsulting GI if hemoglobin continues to drop or if patient has overt hematemesis or hematochezia -trickle tube feeds with water  flushes today and  monitor for tolerance   INFECTIOUS Persistent leukocytosis with recurrent sepsis-most likely source is respiratory given possible infiltrates on chest x-ray and copious tracheal secretions -F/U Sputum culture -Trend CBC and monitor fever curse -Empiric coverage with zosyn  -Procalcitonin level in the morning  ENDOCRINE Mild hyperglycemia without a diagnosis of diabetes   NEUROLOGIC Severe Encephalopathy-Contributing factors include ICU delirium, underlying severe mental illness and sepsis History of crack and cocaine use  RASS goal: 0-1 -Precedex , Seroquel  and Valium  for agitation and and delirium -PRNs Fentanyl  for vent discomfort -Monitor mental status closely and use mittens for safety as needed Failure to wean from vent. Needs TRACH  Best Practice: Code Status: Full code Diet: N.p.o. GI prophylaxis: Protonix  Foley: Reinsert Foley catheter for strict I's and O's monitoring CVC: Left IJ still needed NG: Still needed VTE prophylaxis:  SCD's / patient is not eligible for pharmacologic VTE prophylaxis due acute blood loss anemia     DVT/GI PRX  assessed I Assessed the need for Labs I Assessed the need for Foley I Assessed the need for Central Venous Line Family Discussion when available I Assessed the need for Mobilization I made an Assessment of medications to be adjusted accordingly Safety Risk assessment completed  CASE DISCUSSED IN MULTIDISCIPLINARY ROUNDS WITH ICU  TEAM     Critical Care Time devoted to patient care services described in this note is 35 minutes.  Critical care was necessary to treat /prevent imminent and life-threatening deterioration. Overall, patient is critically ill, prognosis is guarded.  Patient with Multiorgan failure and at high risk for cardiac arrest and death.    Nickolas Alm Cellar, M.D.  [facility]  Medical Director [facility] Medical Director [facility] Cardio-Pulmonary Department

## 2024-03-04 ENCOUNTER — Encounter: Payer: Self-pay | Admitting: Student in an Organized Health Care Education/Training Program

## 2024-03-04 ENCOUNTER — Inpatient Hospital Stay

## 2024-03-04 ENCOUNTER — Encounter
Admission: EM | Disposition: A | Discharge status: Nursing Home (Includes ICF) | Payer: Self-pay | Attending: Internal Medicine

## 2024-03-04 ENCOUNTER — Other Ambulatory Visit: Payer: Self-pay

## 2024-03-04 DIAGNOSIS — J9602 Acute respiratory failure with hypercapnia: Secondary | ICD-10-CM | POA: Diagnosis not present

## 2024-03-04 DIAGNOSIS — I5023 Acute on chronic systolic (congestive) heart failure: Secondary | ICD-10-CM | POA: Diagnosis not present

## 2024-03-04 DIAGNOSIS — Z4682 Encounter for fitting and adjustment of non-vascular catheter: Secondary | ICD-10-CM | POA: Diagnosis not present

## 2024-03-04 DIAGNOSIS — Z9911 Dependence on respirator [ventilator] status: Secondary | ICD-10-CM | POA: Diagnosis not present

## 2024-03-04 DIAGNOSIS — J811 Chronic pulmonary edema: Secondary | ICD-10-CM | POA: Diagnosis not present

## 2024-03-04 DIAGNOSIS — I5043 Acute on chronic combined systolic (congestive) and diastolic (congestive) heart failure: Secondary | ICD-10-CM

## 2024-03-04 DIAGNOSIS — I469 Cardiac arrest, cause unspecified: Secondary | ICD-10-CM | POA: Diagnosis not present

## 2024-03-04 DIAGNOSIS — R140 Abdominal distension (gaseous): Secondary | ICD-10-CM | POA: Diagnosis not present

## 2024-03-04 DIAGNOSIS — R7989 Other specified abnormal findings of blood chemistry: Secondary | ICD-10-CM | POA: Diagnosis not present

## 2024-03-04 DIAGNOSIS — G9341 Metabolic encephalopathy: Secondary | ICD-10-CM | POA: Diagnosis not present

## 2024-03-04 DIAGNOSIS — I110 Hypertensive heart disease with heart failure: Secondary | ICD-10-CM | POA: Diagnosis not present

## 2024-03-04 DIAGNOSIS — J9690 Respiratory failure, unspecified, unspecified whether with hypoxia or hypercapnia: Secondary | ICD-10-CM | POA: Diagnosis not present

## 2024-03-04 DIAGNOSIS — J9601 Acute respiratory failure with hypoxia: Secondary | ICD-10-CM | POA: Diagnosis not present

## 2024-03-04 DIAGNOSIS — R918 Other nonspecific abnormal finding of lung field: Secondary | ICD-10-CM | POA: Diagnosis not present

## 2024-03-04 HISTORY — PX: TRACHEOSTOMY TUBE PLACEMENT: SHX814

## 2024-03-04 LAB — PHOSPHORUS: Phosphorus: 2.4 mg/dL — ABNORMAL LOW (ref 2.5–4.6)

## 2024-03-04 LAB — CBC
HCT: 25.6 % — ABNORMAL LOW (ref 39.0–52.0)
Hemoglobin: 8.1 g/dL — ABNORMAL LOW (ref 13.0–17.0)
MCH: 32.1 pg (ref 26.0–34.0)
MCHC: 31.6 g/dL (ref 30.0–36.0)
MCV: 101.6 fL — ABNORMAL HIGH (ref 80.0–100.0)
Platelets: 418 K/uL — ABNORMAL HIGH (ref 150–400)
RBC: 2.52 MIL/uL — ABNORMAL LOW (ref 4.22–5.81)
RDW: 17.1 % — ABNORMAL HIGH (ref 11.5–15.5)
WBC: 22.1 K/uL — ABNORMAL HIGH (ref 4.0–10.5)
nRBC: 0.8 % — ABNORMAL HIGH (ref 0.0–0.2)

## 2024-03-04 LAB — HEPATIC FUNCTION PANEL
ALT: 93 U/L — ABNORMAL HIGH (ref 0–44)
AST: 57 U/L — ABNORMAL HIGH (ref 15–41)
Albumin: 2.1 g/dL — ABNORMAL LOW (ref 3.5–5.0)
Alkaline Phosphatase: 56 U/L (ref 38–126)
Bilirubin, Direct: 0.8 mg/dL — ABNORMAL HIGH (ref 0.0–0.2)
Indirect Bilirubin: 1.1 mg/dL — ABNORMAL HIGH (ref 0.3–0.9)
Total Bilirubin: 1.9 mg/dL — ABNORMAL HIGH (ref 0.0–1.2)
Total Protein: 6.1 g/dL — ABNORMAL LOW (ref 6.5–8.1)

## 2024-03-04 LAB — BASIC METABOLIC PANEL WITH GFR
Anion gap: 7 (ref 5–15)
BUN: 34 mg/dL — ABNORMAL HIGH (ref 8–23)
CO2: 27 mmol/L (ref 22–32)
Calcium: 7.7 mg/dL — ABNORMAL LOW (ref 8.9–10.3)
Chloride: 110 mmol/L (ref 98–111)
Creatinine, Ser: 1.16 mg/dL (ref 0.61–1.24)
GFR, Estimated: >60 mL/min (ref >60)
Glucose, Bld: 95 mg/dL (ref 70–99)
Potassium: 3.8 mmol/L (ref 3.5–5.1)
Sodium: 144 mmol/L (ref 135–145)

## 2024-03-04 LAB — BLOOD GAS, ARTERIAL
Acid-Base Excess: 9 mmol/L — ABNORMAL HIGH (ref 0.0–2.0)
Bicarbonate: 32.7 mmol/L — ABNORMAL HIGH (ref 20.0–28.0)
MECHVT: 500 mL
Mechanical Rate: 18
O2 Saturation: 97.9 %
PEEP: 5 cmH2O
Patient temperature: 37
RATE: 18 {breaths}/min
pCO2 arterial: 40 mmHg (ref 32–48)
pH, Arterial: 7.52 — ABNORMAL HIGH (ref 7.35–7.45)
pO2, Arterial: 74 mmHg — ABNORMAL LOW (ref 83–108)

## 2024-03-04 LAB — PROTIME-INR
INR: 1.4 — ABNORMAL HIGH (ref 0.8–1.2)
Prothrombin Time: 18 s — ABNORMAL HIGH (ref 11.4–15.2)

## 2024-03-04 LAB — MAGNESIUM: Magnesium: 2.3 mg/dL (ref 1.7–2.4)

## 2024-03-04 LAB — GLUCOSE, CAPILLARY
Glucose-Capillary: 136 mg/dL — ABNORMAL HIGH (ref 70–99)
Glucose-Capillary: 35 mg/dL — CL (ref 70–99)
Glucose-Capillary: 41 mg/dL — CL (ref 70–99)
Glucose-Capillary: 65 mg/dL — ABNORMAL LOW (ref 70–99)
Glucose-Capillary: 81 mg/dL (ref 70–99)
Glucose-Capillary: 86 mg/dL (ref 70–99)
Glucose-Capillary: 87 mg/dL (ref 70–99)
Glucose-Capillary: 94 mg/dL (ref 70–99)
Glucose-Capillary: 95 mg/dL (ref 70–99)

## 2024-03-04 SURGERY — CREATION, TRACHEOSTOMY
Anesthesia: General

## 2024-03-04 MED ORDER — POTASSIUM CHLORIDE 20 MEQ PO PACK
40.0000 meq | PACK | Freq: Once | ORAL | Status: AC
  Administered 2024-03-04: 40 meq
  Filled 2024-03-04: qty 2

## 2024-03-04 MED ORDER — FENTANYL CITRATE (PF) 100 MCG/2ML IJ SOLN
INTRAMUSCULAR | Status: DC | PRN
  Administered 2024-03-04 (×2): 25 ug via INTRAVENOUS

## 2024-03-04 MED ORDER — FUROSEMIDE 10 MG/ML IJ SOLN
40.0000 mg | Freq: Every day | INTRAMUSCULAR | Status: AC
  Administered 2024-03-04 – 2024-03-06 (×3): 40 mg via INTRAVENOUS
  Filled 2024-03-04 (×3): qty 4

## 2024-03-04 MED ORDER — SODIUM CHLORIDE 0.9 % IV SOLN
100.0000 mg | Freq: Every day | INTRAVENOUS | Status: DC
  Administered 2024-03-04: 100 mg via INTRAVENOUS
  Filled 2024-03-04 (×2): qty 5

## 2024-03-04 MED ORDER — K PHOS MONO-SOD PHOS DI & MONO 155-852-130 MG PO TABS
500.0000 mg | ORAL_TABLET | Freq: Two times a day (BID) | ORAL | Status: AC
  Administered 2024-03-04 (×2): 500 mg
  Filled 2024-03-04 (×2): qty 2

## 2024-03-04 MED ORDER — ROCURONIUM BROMIDE 10 MG/ML (PF) SYRINGE
PREFILLED_SYRINGE | INTRAVENOUS | Status: AC
  Filled 2024-03-04: qty 10

## 2024-03-04 MED ORDER — LIDOCAINE-EPINEPHRINE 1 %-1:100000 IJ SOLN
INTRAMUSCULAR | Status: AC
  Filled 2024-03-04: qty 1

## 2024-03-04 MED ORDER — POLYETHYLENE GLYCOL 3350 17 G PO PACK: 17.0000 g | PACK | Freq: Every day | ORAL | Status: DC | PRN

## 2024-03-04 MED ORDER — LACTATED RINGERS IV SOLN: INTRAVENOUS | Status: DC | PRN

## 2024-03-04 MED ORDER — PROPOFOL 10 MG/ML IV BOLUS
INTRAVENOUS | Status: AC
  Filled 2024-03-04: qty 20

## 2024-03-04 MED ORDER — FREE WATER
100.0000 mL | Status: DC
  Administered 2024-03-04 – 2024-03-12 (×48): 100 mL

## 2024-03-04 MED ORDER — DEXAMETHASONE SODIUM PHOSPHATE 10 MG/ML IJ SOLN
INTRAMUSCULAR | Status: DC | PRN
  Administered 2024-03-04: 10 mg via INTRAVENOUS

## 2024-03-04 MED ORDER — OSMOLITE 1.5 CAL PO LIQD
1000.0000 mL | ORAL | Status: DC
  Administered 2024-03-04: 1000 mL

## 2024-03-04 MED ORDER — GLYCOPYRROLATE 0.2 MG/ML IJ SOLN
INTRAMUSCULAR | Status: DC | PRN
  Administered 2024-03-04: .2 mg via INTRAVENOUS

## 2024-03-04 MED ORDER — LIDOCAINE-EPINEPHRINE 1 %-1:100000 IJ SOLN
INTRAMUSCULAR | Status: DC | PRN
Start: 2024-03-04 — End: 2024-03-04
  Administered 2024-03-04: 4 mL

## 2024-03-04 MED ORDER — DEXAMETHASONE SODIUM PHOSPHATE 10 MG/ML IJ SOLN
INTRAMUSCULAR | Status: AC
  Filled 2024-03-04: qty 1

## 2024-03-04 MED ORDER — ROCURONIUM BROMIDE 100 MG/10ML IV SOLN
INTRAVENOUS | Status: DC | PRN
  Administered 2024-03-04: 40 mg via INTRAVENOUS

## 2024-03-04 MED ORDER — DOCUSATE SODIUM 50 MG/5ML PO LIQD
100.0000 mg | Freq: Two times a day (BID) | ORAL | Status: DC
  Administered 2024-03-04 – 2024-03-12 (×4): 100 mg
  Filled 2024-03-04 (×7): qty 10

## 2024-03-04 MED ORDER — BISACODYL 10 MG RE SUPP
10.0000 mg | Freq: Once | RECTAL | Status: AC
  Administered 2024-03-04: 10 mg via RECTAL
  Filled 2024-03-04: qty 1

## 2024-03-04 MED ORDER — 0.9 % SODIUM CHLORIDE (POUR BTL) OPTIME
TOPICAL | Status: DC | PRN
  Administered 2024-03-04: 500 mL

## 2024-03-04 MED ORDER — GLYCOPYRROLATE 0.2 MG/ML IJ SOLN
INTRAMUSCULAR | Status: AC
  Filled 2024-03-04: qty 1

## 2024-03-04 MED ORDER — FENTANYL CITRATE (PF) 100 MCG/2ML IJ SOLN
INTRAMUSCULAR | Status: AC
  Filled 2024-03-04: qty 2

## 2024-03-04 SURGICAL SUPPLY — 28 items
BLADE SURG 15 STRL LF DISP TIS (BLADE) ×1 IMPLANT
BLADE SURG SZ11 CARB STEEL (BLADE) ×1 IMPLANT
CORD BIP STRL DISP 12FT (MISCELLANEOUS) IMPLANT
ELECT CAUTERY NDL 2.0 MIC (NEEDLE) ×1 IMPLANT
ELECT CAUTERY NEEDLE 2.0 MIC (NEEDLE) ×1 IMPLANT
ELECTRODE CAUTERY BLDE TIP 2.5 (TIP) ×1 IMPLANT
ELECTRODE REM PT RTRN 9FT ADLT (ELECTROSURGICAL) ×1 IMPLANT
FORCEPS JEWEL BIP 4-3/4 STR (INSTRUMENTS) IMPLANT
GAUZE 4X4 16PLY ~~LOC~~+RFID DBL (SPONGE) ×1 IMPLANT
GLOVE BIO SURGEON STRL SZ7.5 (GLOVE) ×1 IMPLANT
GOWN STRL REUS W/ TWL LRG LVL3 (GOWN DISPOSABLE) ×3 IMPLANT
HEMOSTAT SURGICEL 2X3 (HEMOSTASIS) ×1 IMPLANT
LABEL OR SOLS (LABEL) ×1 IMPLANT
MANIFOLD NEPTUNE II (INSTRUMENTS) ×1 IMPLANT
NS IRRIG 500ML POUR BTL (IV SOLUTION) ×1 IMPLANT
PACK HEAD/NECK (MISCELLANEOUS) ×1 IMPLANT
SHEARS HARMONIC 9CM CVD (BLADE) IMPLANT
SOLUTION PREP PVP 2OZ (MISCELLANEOUS) ×1 IMPLANT
SPONGE DRAIN TRACH 4X4 STRL 2S (GAUZE/BANDAGES/DRESSINGS) ×1 IMPLANT
SPONGE KITTNER 5P (MISCELLANEOUS) ×1 IMPLANT
STAPLER SKIN PROX 35W (STAPLE) ×1 IMPLANT
SUCTION TUBE FRAZIER 10FR DISP (SUCTIONS) ×1 IMPLANT
SUT SILK 2 0 SH (SUTURE) ×2 IMPLANT
SUT SILK 2-0 18XBRD TIE 12 (SUTURE) IMPLANT
SUTURE VICRYL 4-0 27 PS-2 BART (SUTURE) ×1 IMPLANT
TRAP FLUID SMOKE EVACUATOR (MISCELLANEOUS) ×1 IMPLANT
TUBE TRACH 6.0 CUFF FLEX (MISCELLANEOUS) IMPLANT
TUBE TRACH FLEX 8.5 CUFF (MISCELLANEOUS) IMPLANT

## 2024-03-04 NOTE — Plan of Care (Signed)
 Patient's tube feeds resumed. Patient given suppository and miralax  with results requiring FMS.  Problem: Nutrition: Goal: Adequate nutrition will be maintained Outcome: Progressing   Problem: Elimination: Goal: Will not experience complications related to bowel motility Outcome: Progressing

## 2024-03-04 NOTE — Transfer of Care (Addendum)
 Immediate Anesthesia Transfer of Care Note  Patient: Miguel [name]  Procedure(s) Performed: CREATION, TRACHEOSTOMY  Patient Location: ICU  Anesthesia Type:General  Level of Consciousness: sedated  Airway & Oxygen Therapy: Transferred to ICU with O2 via AMBU, connected to ICU ventilator.   Post-op Assessment: Report given to RN and Post -op Vital signs reviewed and stable  Post vital signs: Reviewed and stable  Last Vitals:  Vitals Value Taken Time  BP    Temp    Pulse    Resp    SpO2      Last Pain:  Vitals:   03/04/24 0400  TempSrc: Axillary  PainSc:          Complications: No notable events documented.

## 2024-03-04 NOTE — Progress Notes (Signed)
 Nutrition Follow-up  DOCUMENTATION CODES:   Severe malnutrition in context of social or environmental circumstances  INTERVENTION:   Osmolite 1.5@60ml /hr- Initiate at 7ml/hr, once tolerating, increase by 10ml/hr q 8 hours until goal rate is reached.   ProSource TF 20- Give 60ml BID via tube, each supplement provides 80kcal and 20g of protein.   Free water  flushes 100ml q4 hours   Regimen provides 2320kcal/day, 130g/day protein and 1644ml/day of fluid.   MVI daily via tube  Thiamine  100mg  daily via tube  Daily weights   NUTRITION DIAGNOSIS:   Severe Malnutrition related to social / environmental circumstances as evidenced by moderate fat depletion, severe fat depletion, moderate muscle depletion, severe muscle depletion. -ongoing   GOAL:   Provide needs based on ASPEN/SCCM guidelines -previously met with tube feeds   MONITOR:   Diet advancement, Labs, TF tolerance, Weight trends, I & O's, Skin  ASSESSMENT:   73 y/o male with h/o CHF, dilated cardiomyopathy, substance abuse, schizophrenia, CVA, depression, HCV, homelessness and recently in jail who is admitted with PEA arrest, AKI, CHF with bilateral pleural effusions, thrombus, shock, H influenza and strep pneumoniae and sepsis.  Pt s/p tracheostomy today. Pt remains ventilated. IR unable to place G-tube r/t pt's anatomy. NGT remains in place. Tube feeds restarted at trickle rate r/t abdominal distension. KUB with NSF; will advance tube feeds as tolerated. Pt noted to have high stomach on CT scan. Pt had a large BM after suppository given today. Pt is refeeding; electrolytes being monitored and supplemented as needed. Per chart, pt is up ~34lbs from his UBW. Pt is noted to have significant edema and pleural effusions; diuresis today. Pt +2.5L on his I & Os. Plan is for LTACH.    Medications reviewed and include: colace, lasix , MVI, oxycodone , protonix , Kphos, KCl, scopolamine , propofol    Labs reviewed: K 3.8 wnl, BUN  34(H), P 2.4(L) Mg 2.3 wnl Folate > 40 wnl, B12 7412(H)- 6/26 Wbc- 22.1(H), Hgb 8.1(L), Hct 25.6(L) Cbgs- 87, 94, 95 x 24 hrs   Patient is currently intubated on ventilator support MV: 8.8 L/min Temp (24hrs), Avg:98.7 F (37.1 C), Min:98.3 F (36.8 C), Max:99.2 F (37.3 C)  Propofol : 8.51 ml/hr- provides 225kcal/day   UOP-   Diet Order:   Diet Order             Diet NPO time specified Except for: Other (See Comments)  Diet effective midnight                  EDUCATION NEEDS:   Not appropriate for education at this time  Skin:  Skin Assessment: Reviewed RN Assessment  Last BM:  7/9- per RN  Height:   Ht Readings from Last 1 Encounters:  03/02/24 6' (1.829 m)    Weight:   Wt Readings from Last 1 Encounters:  03/04/24 99.2 kg    Ideal Body Weight:  80.9 kg  BMI:  Body mass index is 29.66 kg/m.  Estimated Nutritional Needs:   Kcal:  2300-2600kcal/day  Protein:  115-130g/day  Fluid:  1.9-2.1 L  Augustin Shams MS, RD, LDN If unable to be reached, please send secure chat to RD inpatient available from 8:00a-4:00p daily

## 2024-03-04 NOTE — Progress Notes (Signed)
 At approximately 0600, pt began having twitches in head/neck appearing to cause O2 desaturation. Unrelieved by PRN fentanyl  and adjustments in propofol  infusion. NP notified, at bedside. Stat ABG, CXR, KUB ordered.

## 2024-03-04 NOTE — Op Note (Signed)
 OPERATIVE REPORT  Attending Physician: Massie RAMAN. [name], MD, MBA, FARS    Otolaryngology-Head & Neck Surgery      Preoperative Diagnosis: Ventilatory dependence; failure to wean / extubate. Postoperative Diagnosis: Same.   Procedure(s) Performed:   Tracheotomy, CPT 31600   Teaching Surgeon:  Massie RAMAN. [name], MD, MBA, FARS Assistants:  None   Anesthesia:  General Specimens:  None Drains:  6 cuffed tracheostomy tube Estimated Blood Loss:  5 mL  Operative Findings: Normal tracheal anatomy  Procedure: After informed consent was obtained, the patient was brought from the ICU to the operating room and placed supine on the operating room table. After smooth induction of general anesthesia, a timeout was called and all parties were in agreement.   A shoulder roll was placed and landmarks marked.  The neck was injected with approximately 6 mL of 1% lidocaine  with 1:100,000 of epinephrine .  The neck was then prepped and draped in the usual sterile manner.    A horizontal 4 cm incision was then made using a 15 blade through the skin just below the level of the cricoid cartilage.  The dissection was carried down through the strap muscles using a Jake's hemostat and bipolar electrocautery.  Next, dissection continued to the level of the thyroid gland which was divided using monopolar cautery without difficulty.  The anterior tracheal wall was then identified and cleared of soft tissues using a Kitner sponge.  A Bjork-flap incision was then made through the anterior tracheal wall using a 15 blade and extended using Metzenbaum scissors.  The endotracheal tube was in view at this point and the patient ventilating well.  The Bjork-flap was then secured to the inferior skin margin using a single 4-0 Vicryl suture without difficulty.  The endotracheal tube was then pulled back by Anesthesia and, once above the tracheotomy site, a #6 cuffed tracheostomy tube placed without difficulty.  The obturator was then  removed and the inner cannula placed.  The tracheostomy tube was then connected to the ventilatory circuit and secured with 4 interrupted 2-0 silk sutures to the skin at each corner.  Some surgicel was placed adjacent to the tracheal wall margin and, finally, a gauze dressing soaked in betadyne placed under and around the tracheostomy tube flange.  Adequate hemostasis was assured.   The patient's care was turned over to the Anesthesia team who successfully transported the patient back to the ICU in stable condition.  All instrument, sharp and lap counts were correct at the end of the case.   Teaching Surgeon Attestation:  I was present and performed the entire procedure.  Massie RAMAN. [name], MD, MBA, [facility] Otolaryngology-Head & Neck Surgery [facility] [phone]

## 2024-03-04 NOTE — Progress Notes (Signed)
 PHARMACY CONSULT NOTE  Pharmacy Consult for Electrolyte Monitoring and Replacement   Recent Labs: Potassium (mmol/L)  Date Value  03/04/2024 3.8  09/15/2012 4.4   Magnesium  (mg/dL)  Date Value  92/90/7974 2.3   Calcium  (mg/dL)  Date Value  92/90/7974 7.7 (L)   Calcium , Total (mg/dL)  Date Value  98/79/7985 9.4   Albumin (g/dL)  Date Value  92/92/7974 2.2 (L)  09/15/2012 4.2   Phosphorus (mg/dL)  Date Value  92/90/7974 2.4 (L)   Sodium (mmol/L)  Date Value  03/04/2024 144  09/15/2012 146 (H)   Assessment: 73 y/o male with h/o CHF, dilated cardiomyopathy, substance abuse, schizophrenia, CVA, depression, HCV, homelessness and recently in jail who is admitted with PEA arrest, AKI, CHF with bilateral pleural effusions, thrombus, shock, H influenza and strep pneumoniae and sepsis. Pharmacy is asked to follow and replace electrolytes while in CCU  Goal of Therapy:  Potassium 4.0 - 5.1 mmol/L Magnesium  2.0 - 2.4 mg/dL All Other Electrolytes WNL  Plan:  --K 3.8, Kcl 40 mEq per tube x 1 dose --Phos 2.4, K Phos  Neutral 500 mg per tube BID x 2 doses --Labs tomorrow  Miguel [name] 03/04/2024 7:45 AM

## 2024-03-04 NOTE — Anesthesia Preprocedure Evaluation (Signed)
 Anesthesia Evaluation  Patient identified by MRN, date of birth, ID band Patient awake    Reviewed: Allergy & Precautions, H&P , NPO status , Patient's Chart, lab work & pertinent test results, reviewed documented beta blocker date and time   History of Anesthesia Complications Negative for: history of anesthetic complications  Airway Mallampati: Intubated  TM Distance: >3 FB Neck ROM: full    Dental no notable dental hx.    Pulmonary neg pulmonary ROS   Pulmonary exam normal breath sounds clear to auscultation       Cardiovascular Exercise Tolerance: Good hypertension (history of), (-) angina +CHF  (-) Past MI and (-) Cardiac Stents Normal cardiovascular exam(-) dysrhythmias (-) Valvular Problems/Murmurs Rhythm:regular Rate:Normal  ECHO 02/20/24: 1. Left ventricular ejection fraction, by estimation, is 20 to 25%. Left ventricular ejection fraction by 2D MOD biplane is 26.0 %. The left ventricle has severely decreased function. The left ventricle demonstrates global hypokinesis. Left ventricular diastolic parameters are indeterminate.   2. Definity  images detailing very slow flow in the periapical region, possible mural thrombus in the apical region.   3. Right ventricular systolic function is severely reduced. The right ventricular size is moderately enlarged. There is normal pulmonary artery systolic pressure. The estimated right ventricular systolic pressure is 15.0 mmHg.   4. The mitral valve is normal in structure. No evidence of mitral valve regurgitation. No evidence of mitral stenosis.   5. The aortic valve has an indeterminant number of cusps. Aortic valve regurgitation is not visualized. Aortic valve sclerosis is present, with no evidence of aortic valve stenosis.   6. The inferior vena cava is dilated in size with >50% respiratory variability, suggesting right atrial pressure of 8 mmHg.    Neuro/Psych neg Seizures PSYCHIATRIC  DISORDERS   Bipolar Disorder Schizophrenia  CVA    GI/Hepatic negative GI ROS,,,(+)     substance abuse (history of polysubstance abuse)    Endo/Other  negative endocrine ROS    Renal/GU negative Renal ROS  negative genitourinary   Musculoskeletal   Abdominal   Peds  Hematology negative hematology ROS (+)   Anesthesia Other Findings Past Medical History: No date: Biventricular failure (HCC) No date: CVA (cerebral vascular accident) (HCC) No date: Polysubstance use disorder No date: Schizophrenia (HCC)   Reproductive/Obstetrics negative OB ROS                              Anesthesia Physical Anesthesia Plan  ASA: 4  Anesthesia Plan: General   Post-op Pain Management:    Induction:   PONV Risk Score and Plan: 2  Airway Management Planned: Oral ETT and Tracheostomy  Additional Equipment:   Intra-op Plan:   Post-operative Plan: Post-operative intubation/ventilation  Informed Consent: I have reviewed the patients History and Physical, chart, labs and discussed the procedure including the risks, benefits and alternatives for the proposed anesthesia with the patient or authorized representative who has indicated his/her understanding and acceptance.     Dental Advisory Given  Plan Discussed with: Anesthesiologist, CRNA and Surgeon  Anesthesia Plan Comments:          Anesthesia Quick Evaluation

## 2024-03-04 NOTE — Interval H&P Note (Signed)
 History and Physical Interval Note:  03/04/2024 7:31 AM  Miguel [name]  has presented today for surgery, with the diagnosis of ventilatory dependence failure to wean.  The various methods of treatment have been discussed with the patient and family. After consideration of risks, benefits and other options for treatment, the patient has consented to  Procedure(s): CREATION, TRACHEOSTOMY (N/A) as a surgical intervention.  The patient's history has been reviewed, patient examined, no change in status, stable for surgery.  I have reviewed the patient's chart and labs.  Questions were answered to the patient's satisfaction.     [name] S  No changes to H&P.   [name]. [name], MD, MBA, [facility] [phone]

## 2024-03-04 NOTE — Progress Notes (Signed)
 NAME:  Miguel [name], MRN:  [id number], DOB:  07-01-1951, LOS: 13 ADMISSION DATE:  02/20/2024, CONSULTATION DATE: 02/20/2024 REFERRING MD: Dr. Levander, CHIEF COMPLAINT: Cardiac Arrest    History of Present Illness:  This is a 73 yo male who [facility] to [facility] ER [facility] from jail on 06/26 post cardiac arrest.  Per ER notes pts cellmate heard the pt gurgling this morning.  He was found pulseless with initial cardiac rhythm asystole.  EMS administered 3 doses of epi/2 mg of narcan .  Following administration of narcan  ROSC achieved.  Estimated downtime 15 minutes.  EMS reported en route to the ER pt became agitated and attempted to remove Igel.  EMS administered versed .  Pt became pulseless again en route ACLS protocol initiated.  ED Course  Upon arrival to the ER pt remained pulseless in PEA.  ACLS protocol continued pt received: 2 mg of epi/4 mg of iv narcan /1 amp of bicarb/1 g of calcium .  Igel exchanged for an ETT with ongoing CPR (pt did not require RSI medications for intubation).  ROSC achieved with estimated downtime during 2nd cardiac arrest 15 minutes.  CTA Chest negative for PE.  Significant lab results were: Na+ 129/K+ 5.3/chloride 90/glucose 110/BUN 49/creatinine 2.22/calcium  7.5/mag 3.2/alk phos 134/AST 3,301/ALT 2,038/total bilirubin 5.5/troponin 344/lactic acid 8.1/urine drug screen negative/UA concerning for possible UTI.  Sepsis protocol initiated pt to receive: cefepime /metronidazole /vancomycin .  Pt found to be severely hypoglycemic CBG 17 requiring 1 amp of D50W.  Levophed  gtt initiated due to hypotension.  PCCM team contacted for ICU admission.  Pt remains in police custody with bilateral ankle cuffs in place, and police officer at bedside.   Pertinent  Medical History  Schizophrenia  Depression Current Smoker  Chronic Systolic CHF (Echo 09/11/22: EF <20%, grade II diastolic dysfunction, trivial pericardial effusion, mild mitral valve regurgitation, trivial tricuspid valve  regurgitation, trivial pulmonic valve regurgitation) CVA with aphasia  Micro Data:   MRSA PCR 06/26>>negative  COVID/Influenza A&B/RSV 06/26>>negative  RVP 06/26>>negative  Blood 06/26>>NGTD  Tracheal aspirate 06/26>>H influenza and strep pneumoniae  Urine 06/26>>less than 10,000 colonies/ml insignificant growth  Legionella pneum ur ag 06/26>>negative   Anti-infectives (From admission, onward)    Start     Dose/Rate Route Frequency Ordered Stop   03/03/24 0600  ceFAZolin  (ANCEF ) IVPB 2g/100 mL premix        2 g 200 mL/hr over 30 Minutes Intravenous On call to O.R. 03/02/24 1432 03/04/24 0559   02/28/24 1000  piperacillin -tazobactam (ZOSYN ) IVPB 3.375 g  Status:  Discontinued        3.375 g 12.5 mL/hr over 240 Minutes Intravenous Every 8 hours 02/28/24 0815 03/03/24 1021   02/22/24 1100  cefTRIAXone  (ROCEPHIN ) 2 g in sodium chloride  0.9 % 100 mL IVPB        2 g 200 mL/hr over 30 Minutes Intravenous Every 24 hours 02/22/24 0952 02/27/24 1134   02/22/24 1100  azithromycin  (ZITHROMAX ) 500 mg in sodium chloride  0.9 % 250 mL IVPB  Status:  Discontinued        500 mg 250 mL/hr over 60 Minutes Intravenous Every 24 hours 02/22/24 0952 02/24/24 1009   02/21/24 0000  ceFEPIme  (MAXIPIME ) 2 g in sodium chloride  0.9 % 100 mL IVPB  Status:  Discontinued        2 g 200 mL/hr over 30 Minutes Intravenous Every 12 hours 02/20/24 1051 02/22/24 0952   02/20/24 1200  vancomycin  (VANCOREADY) IVPB 2000 mg/400 mL        2,000 mg  200 mL/hr over 120 Minutes Intravenous  Once 02/20/24 1007 02/20/24 1400   02/20/24 1050  vancomycin  variable dose per unstable renal function (pharmacist dosing)  Status:  Discontinued         Does not apply See admin instructions 02/20/24 1051 02/21/24 0925   02/20/24 0845  ceFEPIme  (MAXIPIME ) 2 g in sodium chloride  0.9 % 100 mL IVPB        2 g 200 mL/hr over 30 Minutes Intravenous  Once 02/20/24 0838 02/20/24 1410   02/20/24 0845  metroNIDAZOLE  (FLAGYL ) IVPB 500 mg         500 mg 100 mL/hr over 60 Minutes Intravenous  Once 02/20/24 0838 02/20/24 1559   02/20/24 0845  vancomycin  (VANCOCIN ) IVPB 1000 mg/200 mL premix  Status:  Discontinued        1,000 mg 200 mL/hr over 60 Minutes Intravenous  Once 02/20/24 0838 02/20/24 1007      Significant Hospital Events: Including procedures, antibiotic start and stop dates in addition to other pertinent events   06/26: Pt admitted post cardiac arrest mechanically intubated with possible cardiogenic/possible septic shock with multiorgan failure requiring levophed  and dobutamine  gtts 06/27: on minimal sedation, discontinued this AM. Off nor-epi, continued on dobutamine  06/28: decreased dobutamine  to 2.5, remains off sedation 06/29: arouses to tactile and verbal stimuli, remains disoriented. Failed SBT this AM. Dobutamine  increased to 5 mcg overnight due to bradycardia 06/30: Pt remains mechanically intubated, however neuro exam precludes extubation.  He has not received sedation since 06/27. Dobutamine  discontinued  06/30: MRI Brain Since the previous MRI, the patient has developed chronic           encephalomalacia changes within the right medial occipital lobe compatible with an        interval  PCA distribution infarct. Interval development of encephalomalacia in area of        previously demonstrated left MCA distribution infarct, which was acute on the        previous MRI. 07/01: Overnight pt remained in atrial flutter with rvr hr 140 to 150's with possible ST elevation despite iv metoprolol .  Cardiologist Dr Darron notified recommended amiodarone  bolus followed by amiodarone  gtt for rate control.  However, this morning while on amiodarone  gtt pt became severely bradycardic hr as low as 27 bpm with associated hypotension, amiodarone  gtt discontinued he is now requiring low dose levophed  gtt. Self extubated respiratory status stable but remained delirious requiring precedex  gtt  07/02: No significant events noted overnight.   Remains on Precedex  due to delirium, currently protecting his airway.  Afebrile, hemodynamically stable, Levophed  weaned off, Bradycardia resolved, no reports of tachyarrhythmias.  Dobutamine  weaned to 2.5 and Coox remains stable at 83, will d/c Dobutamine .  Speech/PT/OT consulted.  Failed speech evaluation, plan to place Dobhoff as able, remove foley. 07/03: No significant events noted overnight. Remains on low dose precedex  (0.4 mcg) due to delirium.  Afebrile, hemodynamically stable, no vasopressors.  Coox 73 and Lactic 1.1 OFF of inotropes and OFF Levophed .  Dobhoff with difficultly functioning, will exchange it to a regular NGT for feeds and med administration.  Gentle diuresis with 20 mg IV Lasix  x1 dose.  Hgb decreased to 9 from 11.7, no signs of bleeding, will d/c Heparin  gtt given no thrombus on repeat Echo. 07/04: Overnight Levophed  reinitiated, Hgb dropped to 6.4 (no reports of bleeding from nursing, abdominal exam is benign), ordered for 1 unit pRBC's, Lovenox  d/c,  increase PPI to BID dosing.  With worsening Leukocytosis up to 15.5  from 12.8 and increased FiO2 requirements to up 10 L (overnight deep Nasotracheal suctioning yielded copious amounts of thick secretions) , will check PCT and start empiric Zosyn  for aspiration coverage.  WOB is improved from yesterday, on low dose Precedex  but more interactive today.  Again unable to complete speech evaluation, will attempt NG tube placement again. 07/05: Remains confused and delirious. HR in the 140s; unresponsive to amiodarone  bolus and infusion. Transfused 1 unit of PRBCs for Hg of 7.1. Started on valium  for agitation. 07/06: Intubated overnight due to worsening mental status and thick copious secretions 07/09: Pt remains mechanically intubated pending trach placement   Interim History / Subjective:  As outlined above under significant events   Objective    Blood pressure (!) 108/56, pulse (!) 57, temperature 98.6 F (37 C), temperature  source Axillary, resp. rate 18, height 6' (1.829 m), weight 99.2 kg, SpO2 (!) 85%.    Vent Mode: PRVC FiO2 (%):  [35 %] 35 % Set Rate:  [18 bmp] 18 bmp Vt Set:  [500 mL] 500 mL PEEP:  [5 cmH20] 5 cmH20 Plateau Pressure:  [16 cmH20-19 cmH20] 16 cmH20   Intake/Output Summary (Last 24 hours) at 03/04/2024 0929 Last data filed at 03/04/2024 0841 Gross per 24 hour  Intake 1388.8 ml  Output 1580 ml  Net -191.2 ml   Filed Weights   03/01/24 0448 03/03/24 0500 03/04/24 0500  Weight: 94.5 kg 96.5 kg 99.2 kg    Examination: General: Acute on chronically-ill appearing male, NAD mechanically intubated  HENT: Very poor dentition, no JVD  Lungs: Rhonchi throughout, even, non labored  Cardiovascular: Sinus rhythm, s1s2, no m/r/g, 1+ radial/1+ distal pulses, 1+ generalized edema  Abdomen: +BS x4, soft, non distended  Extremities: Normal bulk and tone Skin: Intact no rashes or lesions present  Neuro: Sedated, not following commands, PERRL GU: Urinary retention requiring in and out cath   Resolved problem list  Hyponatremia  Lactic acidosis  Hyperkalemia   Assessment and Plan   #Acute metabolic encephalopathy: concerning for possible anoxic brain injury  #Mechanical intubation pain/discomfort Hx: Polysubstance abuse, depression, and schizoaffective disorder EEG 02/21/24: Study is suggestive of severe to profound diffuse encephalopathy. No seizures or epileptiform discharges were seen throughout the recording. MRI Brain 02/24/24: Since the previous MRI, the patient has developed chronic encephalomalacia changes within the right medial occipital lobe compatible with an interval PCA distribution infarct. Interval development of encephalomalacia in area of previously demonstrated left MCA distribution infarct, which was acute on the previous MRI. - Urine drug screen negative  - Correct metabolic derangements  - Maintain RASS goal of 0 to -1 - PAD protocol to maintain RASS goal: propofol  gtt and  prn fentanyl   - Scheduled valium  and oxycodone  to assist with weaning continuous sedation  - WUA daily  - Avoid sedation medication as able   #Cardiac arrest (PEA)  #Cardiogenic/possible septic shock~resolved   #Acute on chronic systolic CHF #Elevated troponin suspect secondary to demand ischemia  #Possible mural thrombus  #Atrial flutter with rvr  Hx: CVA with aphasia  CTA Chest PE 02/20/24: No evidence of acute pulmonary embolism. Rounded filling defect within the right atrial appendage, suspicious for thrombus. Echo 02/20/24: EF 20 to 25% - Continuous telemetry monitoring  - Troponin peaked at 2,551  - Prn levophed  gtt to maintain map 65 or higher  - Hold outpatient diuretics and antihypertensives for now  - Continue scheduled amiodarone  and digoxin  per tube for rate control  - Repeat Echo 06/30: no definitive  LV thrombus  - Unable to anticoagulate due to acute blood loss anemia in setting of left-sided retroperitoneal hemorrhage  - Cardiology consulted appreciate input: not a candidate for cardiac cath or revascularization due to poor neurologic function post cardiac arrest   #Acute respiratory failure  #Bilateral pleural effusions right greater than left  #H influenza and strep pneumoniae~treated  #Mechanical intubation  Hx: Tobacco abuse  - Full vent support for now: vent settings reviewed and established  - Continue lung protective strategies  - Maintain plateau pressures less than 30 cm H2O - VAP bundle implemented  - SBT once all parameters met: mentation precluding extubation at this time  - Intermittent CXR's and ABG's  - Prn bronchodilator therapy   #Acute kidney injury secondary to ATN~resolved   - Trend BMP  - Replace electrolytes as indicated  - Strict I&O's - Avoid nephrotic agents as able   #Shock liver~improving   #Elevated alk phos and total bilirubin~improving   CT Abd/Pelvis 06/26: Mild diffuse mural thickening, which may be seen in the setting of  hypoperfusion. Small volume free fluid. HCV ab 06/26: reactive  - Trend hepatic function panel - Avoid hepatotoxic agents as able   #Sepsis~treated   #H influenza and strep pneumoniae~treated  - Trend WBC and monitor fever curve   #Anemia without signs of bleeding  - Trend CBC  - Monitor for s/sx of bleeding  - Transfuse fo hgb <7  #Hypoglycemia  - CBG's q4hrs for now  - Follow hypo/hyperglycemic protocol - Target CBG range 140 to 180  Best Practice (right click and Reselect all SmartList Selections daily)   Diet/type: TF's DVT prophylaxis SCD's Pressure ulcer(s): N/A GI prophylaxis: PPI Lines: Central line (Right Brachial PICC line placed 03/03/24) Foley:  N/A Code Status:  full code Last date of multidisciplinary goals of care discussion [03/04/24]  Labs   CBC: Recent Labs  Lab 02/29/24 1433 03/01/24 0455 03/02/24 0342 03/03/24 0310 03/04/24 0400  WBC 13.8* 18.1* 16.7* 22.4* 22.1*  HGB 7.1* 8.0* 8.0* 7.9* 8.1*  HCT 21.8* 24.6* 24.8* 24.6* 25.6*  MCV 97.8 96.1 96.5 98.4 101.6*  PLT 329 364 372 388 418*    Basic Metabolic Panel: Recent Labs  Lab 02/29/24 0500 02/29/24 1433 03/01/24 0455 03/02/24 0342 03/03/24 0310 03/04/24 0400  NA 144  --  141 142 143 144  K 4.7  --  4.7 3.5 3.6 3.8  CL 102  --  99 104 104 110  CO2 32  --  29 30 29 27   GLUCOSE 111*  --  91 117* 117* 95  BUN 56*  --  56* 48* 44* 34*  CREATININE 1.77*  --  1.99* 1.78* 1.47* 1.16  CALCIUM  8.1*  --  8.0* 7.9* 7.8* 7.7*  MG 2.4  --  2.6* 2.5* 2.2 2.3  PHOS 4.2 4.1 4.5 2.5  --  2.4*   GFR: Estimated Creatinine Clearance: 71.2 mL/min (by C-G formula based on SCr of 1.16 mg/dL). Recent Labs  Lab 02/27/24 0243 02/28/24 0306 02/28/24 0827 02/28/24 1318 02/29/24 0500 02/29/24 1432 02/29/24 1433 03/01/24 0455 03/02/24 0342 03/03/24 0310 03/04/24 0400  PROCALCITON  --  0.80  --   --   --   --   --  0.40 0.37  --   --   WBC 12.8* 15.5*  --   --    < >  --    < > 18.1* 16.7* 22.4*  22.1*  LATICACIDVEN 1.1  --  1.3 1.2  --  1.0  --   --   --   --   --    < > = values in this interval not displayed.    Liver Function Tests: Recent Labs  Lab 02/27/24 0243 02/28/24 0306 02/29/24 0500 03/01/24 0455 03/02/24 0342 03/04/24 0400  AST 87* 76* 74*  --   --  57*  ALT 333* 251* 221*  --   --  93*  ALKPHOS 68 62 62  --   --  56  BILITOT 1.5* 1.2 1.4*  --   --  1.9*  PROT 6.0* 5.8* 5.9*  --   --  6.1*  ALBUMIN 2.5* 2.3* 2.5* 2.6* 2.2* 2.1*   No results for input(s): LIPASE, AMYLASE in the last 168 hours. No results for input(s): AMMONIA in the last 168 hours.  ABG    Component Value Date/Time   PHART 7.52 (H) 03/04/2024 0647   PCO2ART 40 03/04/2024 0647   PO2ART 74 (L) 03/04/2024 0647   HCO3 32.7 (H) 03/04/2024 0647   ACIDBASEDEF 1.1 02/20/2024 1452   O2SAT 97.9 03/04/2024 0647     Coagulation Profile: Recent Labs  Lab 03/02/24 0342 03/04/24 0039  INR 1.4* 1.4*    Cardiac Enzymes: No results for input(s): CKTOTAL, CKMB, CKMBINDEX, TROPONINI in the last 168 hours.  HbA1C: Hgb A1c MFr Bld  Date/Time Value Ref Range Status  07/14/2018 04:29 AM 5.8 (H) 4.8 - 5.6 % Final    Comment:    (NOTE) Pre diabetes:          5.7%-6.4% Diabetes:              >6.4% Glycemic control for   <7.0% adults with diabetes   07/13/2018 04:25 AM 6.2 (H) 4.8 - 5.6 % Final    Comment:    (NOTE) Pre diabetes:          5.7%-6.4% Diabetes:              >6.4% Glycemic control for   <7.0% adults with diabetes     CBG: Recent Labs  Lab 03/03/24 1925 03/03/24 2115 03/04/24 0011 03/04/24 0742 03/04/24 0909  GLUCAP 105* 86 95 94 87    Review of Systems:   Unable to assess pt mechanically intubated   Past Medical History:  He,  has a past medical history of Biventricular failure (HCC), CVA (cerebral vascular accident) (HCC), Polysubstance use disorder, and Schizophrenia (HCC).   Surgical History:   Past Surgical History:  Procedure Laterality  Date   APPENDECTOMY       Social History:   reports that he has never smoked. He has never used smokeless tobacco. He reports that he does not drink alcohol and does not use drugs.   Family History:  His Family history is unknown by patient.   Allergies No Known Allergies   Home Medications  Prior to Admission medications   Medication Sig Start Date End Date Taking? Authorizing Provider  ARIPiprazole  (ABILIFY ) 5 MG tablet Take 1 tablet (5 mg total) by mouth daily. 09/17/22   Jens Durand, MD  ARISTADA 882 MG/3.2ML prefilled syringe Inject 882 mg into the muscle every 30 (thirty) days. 08/23/22   [provider]  atorvastatin  (LIPITOR) 40 MG tablet Take 1 tablet (40 mg total) by mouth daily at 6 PM. 07/15/18   Laurence Bridegroom, MD  divalproex  (DEPAKOTE ) 500 MG DR tablet Take 1 tablet (500 mg total) by mouth every 12 (twelve) hours. 09/17/22   Jens Durand, MD  furosemide  (LASIX ) 40  MG tablet Take 1 tablet (40 mg total) by mouth daily. 06/19/22 06/19/23  Arlander Charleston, MD  lisinopril  (ZESTRIL ) 10 MG tablet Take 1 tablet (10 mg total) by mouth daily. 09/17/22   Jens Durand, MD  metoprolol  succinate (TOPROL -XL) 25 MG 24 hr tablet Take 1 tablet (25 mg total) by mouth daily. 09/17/22   Jens Durand, MD  ondansetron  (ZOFRAN -ODT) 4 MG disintegrating tablet Take 1 tablet (4 mg total) by mouth every 6 (six) hours as needed for nausea or vomiting. 04/27/23   Ward, Josette SAILOR, DO  pantoprazole  (PROTONIX ) 40 MG tablet Take 1 tablet (40 mg total) by mouth daily. 04/27/23 04/26/24  Ward, Josette SAILOR, DO     Critical care time: 43 minutes      Lonell Moose, AGNP  Pulmonary/Critical Care Pager [phone] (please enter 7 digits) PCCM Consult Pager [phone] (please enter 7 digits)

## 2024-03-05 ENCOUNTER — Encounter: Payer: Self-pay | Admitting: Otolaryngology

## 2024-03-05 DIAGNOSIS — J9601 Acute respiratory failure with hypoxia: Secondary | ICD-10-CM | POA: Diagnosis not present

## 2024-03-05 DIAGNOSIS — R7989 Other specified abnormal findings of blood chemistry: Secondary | ICD-10-CM | POA: Diagnosis not present

## 2024-03-05 DIAGNOSIS — J9602 Acute respiratory failure with hypercapnia: Secondary | ICD-10-CM | POA: Diagnosis not present

## 2024-03-05 DIAGNOSIS — I469 Cardiac arrest, cause unspecified: Secondary | ICD-10-CM | POA: Diagnosis not present

## 2024-03-05 DIAGNOSIS — Z7189 Other specified counseling: Secondary | ICD-10-CM | POA: Diagnosis not present

## 2024-03-05 LAB — BASIC METABOLIC PANEL WITH GFR
Anion gap: 10 (ref 5–15)
BUN: 42 mg/dL — ABNORMAL HIGH (ref 8–23)
CO2: 27 mmol/L (ref 22–32)
Calcium: 7.8 mg/dL — ABNORMAL LOW (ref 8.9–10.3)
Chloride: 104 mmol/L (ref 98–111)
Creatinine, Ser: 1.02 mg/dL (ref 0.61–1.24)
GFR, Estimated: >60 mL/min (ref >60)
Glucose, Bld: 136 mg/dL — ABNORMAL HIGH (ref 70–99)
Potassium: 4.1 mmol/L (ref 3.5–5.1)
Sodium: 141 mmol/L (ref 135–145)

## 2024-03-05 LAB — CBC
HCT: 26.7 % — ABNORMAL LOW (ref 39.0–52.0)
Hemoglobin: 8.4 g/dL — ABNORMAL LOW (ref 13.0–17.0)
MCH: 31.1 pg (ref 26.0–34.0)
MCHC: 31.5 g/dL (ref 30.0–36.0)
MCV: 98.9 fL (ref 80.0–100.0)
Platelets: 496 K/uL — ABNORMAL HIGH (ref 150–400)
RBC: 2.7 MIL/uL — ABNORMAL LOW (ref 4.22–5.81)
RDW: 17.2 % — ABNORMAL HIGH (ref 11.5–15.5)
WBC: 21.6 K/uL — ABNORMAL HIGH (ref 4.0–10.5)
nRBC: 0.8 % — ABNORMAL HIGH (ref 0.0–0.2)

## 2024-03-05 LAB — MAGNESIUM: Magnesium: 2.2 mg/dL (ref 1.7–2.4)

## 2024-03-05 LAB — GLUCOSE, CAPILLARY
Glucose-Capillary: 106 mg/dL — ABNORMAL HIGH (ref 70–99)
Glucose-Capillary: 129 mg/dL — ABNORMAL HIGH (ref 70–99)
Glucose-Capillary: 86 mg/dL (ref 70–99)
Glucose-Capillary: 96 mg/dL (ref 70–99)

## 2024-03-05 LAB — PHOSPHORUS: Phosphorus: 3.2 mg/dL (ref 2.5–4.6)

## 2024-03-05 MED ORDER — OSMOLITE 1.5 CAL PO LIQD
1000.0000 mL | ORAL | Status: DC
  Administered 2024-03-05 – 2024-03-10 (×5): 1000 mL

## 2024-03-05 MED ORDER — OXYCODONE HCL 5 MG PO TABS
5.0000 mg | ORAL_TABLET | Freq: Four times a day (QID) | ORAL | Status: DC
  Administered 2024-03-05 – 2024-03-12 (×28): 5 mg
  Filled 2024-03-05 (×28): qty 1

## 2024-03-05 MED ORDER — FLUCONAZOLE 100 MG PO TABS
200.0000 mg | ORAL_TABLET | Freq: Every day | ORAL | Status: AC
  Administered 2024-03-05 – 2024-03-11 (×7): 200 mg
  Filled 2024-03-05 (×8): qty 2

## 2024-03-05 NOTE — TOC Progression Note (Signed)
 Transition of Care [facility]) - Progression Note    Patient Details  Name: Miguel [name] MRN: [id number] Date of Birth: Sep 09, 1950  Transition of Care [facility]) CM/SW Contact  Kwamaine Cuppett A Kariem Wolfson, RN Phone Number: 03/05/2024, 11:07 AM  Clinical Narrative:    Chart reviewed.  I have spoken to patient's brother Miguel [name] in regards to discharge planning.  I have discussed LTACH level of care with Miguel [name].  I have provided choice for LTACH in this area( [facility] and [facility]) I have asked Miguel [name] with [facility] and [place] with [facility] to speak with Miguel [name] about services that their facilities can provide.    Miguel [name] has selected [facility].  I have made Miguel [name] with [facility] aware.  Miguel [name] reports that patient has a [employer]  The plan will require 3 failed wean attempts before authorization can be submitted to the insurance company.  I have made Miguel [name] aware of the of the above information.    Noted that patient received trach on yesterday.    TOC will continue to follow for discharge planning.         Expected Discharge Plan and Services                                               Social Determinants of Health (SDOH) Interventions SDOH Screenings   Food Insecurity: Patient Unable To Answer (02/21/2024)  Housing: Patient Unable To Answer (02/21/2024)  Transportation Needs: Patient Unable To Answer (02/21/2024)  Utilities: Patient Unable To Answer (02/21/2024)  Social Connections: Patient Unable To Answer (02/21/2024)  Tobacco Use: Low Risk  (03/04/2024)    Readmission Risk Interventions     No data to display

## 2024-03-05 NOTE — Plan of Care (Signed)
  Problem: Nutrition: Goal: Adequate nutrition will be maintained Outcome: Progressing   Problem: Coping: Goal: Level of anxiety will decrease Outcome: Progressing   Problem: Elimination: Goal: Will not experience complications related to urinary retention Outcome: Progressing   Problem: Education: Goal: Knowledge of General Education information will improve Description: Including pain rating scale, medication(s)/side effects and non-pharmacologic comfort measures Outcome: Not Progressing   Problem: Health Behavior/Discharge Planning: Goal: Ability to manage health-related needs will improve Outcome: Not Progressing   Problem: Education: Goal: Ability to manage disease process will improve Outcome: Not Progressing

## 2024-03-05 NOTE — Anesthesia Postprocedure Evaluation (Signed)
 Anesthesia Post Note  Patient: Miguel [name]  Procedure(s) Performed: CREATION, TRACHEOSTOMY  Patient location during evaluation: ICU Anesthesia Type: General Level of consciousness: sedated Vital Signs Assessment: post-procedure vital signs reviewed and stable Respiratory status: patient on ventilator - see flowsheet for VS (tracheostomy) Cardiovascular status: stable Anesthetic complications: no   No notable events documented.   Last Vitals:  Vitals:   03/05/24 0500 03/05/24 0600  BP: 113/63 116/64  Pulse:    Resp: 19 18  Temp: (!) 36 C (!) 36 C  SpO2:      Last Pain:  Vitals:   03/05/24 0400  TempSrc: Rectal  PainSc:                  Breyer Tejera

## 2024-03-05 NOTE — Progress Notes (Signed)
 PHARMACY CONSULT NOTE  Pharmacy Consult for Electrolyte Monitoring and Replacement   Recent Labs: Potassium (mmol/L)  Date Value  03/05/2024 4.1  09/15/2012 4.4   Magnesium  (mg/dL)  Date Value  92/89/7974 2.2   Calcium  (mg/dL)  Date Value  92/89/7974 7.8 (L)   Calcium , Total (mg/dL)  Date Value  98/79/7985 9.4   Albumin (g/dL)  Date Value  92/90/7974 2.1 (L)  09/15/2012 4.2   Phosphorus (mg/dL)  Date Value  92/89/7974 3.2   Sodium (mmol/L)  Date Value  03/05/2024 141  09/15/2012 146 (H)   Assessment: 73 y/o male with h/o CHF, dilated cardiomyopathy, substance abuse, schizophrenia, CVA, depression, HCV, homelessness and recently in jail who is admitted with PEA arrest, AKI, CHF with bilateral pleural effusions, thrombus, shock, H influenza and strep pneumoniae and sepsis. Pharmacy is asked to follow and replace electrolytes while in CCU  Goal of Therapy:  Potassium 4.0 - 5.1 mmol/L Magnesium  2.0 - 2.4 mg/dL All Other Electrolytes WNL  Plan:  --No electrolyte replacement indicated at this time --Labs tomorrow  Marolyn KATHEE Mare 03/05/2024 7:46 AM

## 2024-03-05 NOTE — Progress Notes (Signed)
 Daily Progress Note   Patient Name: Miguel [name]       Date: 03/05/2024 DOB: [birth year]  Age: 73 y.o. MRN#: [id number] Attending Physician: Isaiah Scrivener, MD Primary Care Physician: [facility] Admit Date: 02/20/2024  Reason for Consultation/Follow-up: Establishing goals of care  Subjective: Notes and labs reviewed.  In to see patient.  He is currently resting in bed at this time tracheostomy which was placed yesterday.  Patient with no obvious distress noted at this time.  No family at bedside.  PMT will shadow.  Length of Stay: 14  Current Medications: Scheduled Meds:   amiodarone   400 mg Per Tube Daily   ARIPiprazole   30 mg Per Tube QHS   Chlorhexidine  Gluconate Cloth  6 each Topical Daily   diazepam   2.5 mg Per Tube Q6H   digoxin   0.125 mg Per NG tube Daily   docusate  100 mg Per Tube BID   feeding supplement (OSMOLITE 1.5 CAL)  1,000 mL Per Tube Q24H   feeding supplement (PROSource TF20)  60 mL Per Tube BID   fluconazole   200 mg Per Tube Daily   free water   100 mL Per Tube Q4H   furosemide   40 mg Intravenous Daily   multivitamin with minerals  1 tablet Per Tube Daily   mouth rinse  15 mL Mouth Rinse Q2H   oxyCODONE   5 mg Per Tube Q6H   pantoprazole  (PROTONIX ) IV  40 mg Intravenous Q12H   scopolamine   1 patch Transdermal Once   sodium chloride  flush  10-40 mL Intracatheter Q12H   sodium chloride  flush  10-40 mL Intracatheter Q12H   thiamine   100 mg Per Tube Daily    Continuous Infusions:  propofol  (DIPRIVAN ) infusion Stopped (03/05/24 1047)    PRN Meds: bisacodyl , fentaNYL  (SUBLIMAZE ) injection, ipratropium-albuterol , mouth rinse, polyethylene glycol, sodium chloride  flush, sodium chloride  flush  Physical Exam Constitutional:      Comments: Eyes  closed  Pulmonary:     Comments: With tracheostomy Skin:    General: Skin is warm and dry.             Vital Signs: BP 107/60   Pulse (!) 58   Temp (!) 97.5 F (36.4 C)   Resp 18   Ht 6' (1.829 m)   Wt 99.6 kg  SpO2 100%   BMI 29.78 kg/m  SpO2: SpO2: 100 % O2 Device: O2 Device: Ventilator O2 Flow Rate: O2 Flow Rate (L/min): 15 L/min  Intake/output summary:  Intake/Output Summary (Last 24 hours) at 03/05/2024 1216 Last data filed at 03/05/2024 1100 Gross per 24 hour  Intake 887.42 ml  Output 3745 ml  Net -2857.58 ml   LBM: Last BM Date : 03/05/24 Baseline Weight: Weight: 103.4 kg Most recent weight: Weight: 99.6 kg   Patient Active Problem List   Diagnosis Date Noted   Protein-calorie malnutrition, severe 03/03/2024   Cardiac arrest (HCC) 02/20/2024   Malnutrition of moderate degree 02/20/2024   Aspiration pneumonia of both lungs due to gastric secretions (HCC) 02/20/2024   HFrEF (heart failure with reduced ejection fraction) (HCC) 02/20/2024   LV (left ventricular) mural thrombus 02/20/2024   Elevated troponin 02/20/2024   Transaminitis 02/20/2024   Dilated cardiomyopathy (HCC) 02/20/2024   Schizoaffective disorder, bipolar type (HCC) 09/14/2022   Hypertensive emergency 09/13/2022   Bilateral pleural effusion 09/13/2022   Cocaine use disorder (HCC) 09/13/2022   Acute on chronic systolic CHF (congestive heart failure) (HCC) 09/10/2022   Acute respiratory failure with hypoxia (HCC) 09/10/2022   Stroke (HCC) 07/12/2018   Noncompliance 04/11/2016    Palliative Care Assessment & Plan    Recommendations/Plan: Patient with trach in place now waiting for LTAC placement. PMT will shadow  Code Status:    Code Status Orders  (From admission, onward)           Start     Ordered   02/20/24 0934  Full code  Continuous       Question:  By:  Answer:  Default: patient does not have capacity for decision making, no surrogate or prior directive available    02/20/24 0935           Code Status History     Date Active Date Inactive Code Status Order ID Comments User Context   09/10/2022 1100 09/15/2022 2253 Full Code [id number]  Maranda Lonell MATSU, NP ED   07/12/2018 2318 07/15/2018 1504 Full Code [id number]  Jenel Lenis, MD ED       Thank you for allowing the Palliative Medicine Team to assist in the care of this patient.    Camelia Lewis, NP  Please contact Palliative Medicine Team phone at [phone] for questions and concerns.

## 2024-03-05 NOTE — Progress Notes (Signed)
 NAME:  Miguel [name], MRN:  [id number], DOB:  1950-10-29, LOS: 14 ADMISSION DATE:  02/20/2024, CONSULTATION DATE: 02/20/2024 REFERRING MD: Dr. Levander,  CHIEF COMPLAINT: Cardiac Arrest    History of Present Illness:  73 yo male who presented to [facility] ER via EMS from jail on 06/26 post cardiac arrest.  Per ER notes pts cellmate heard the pt gurgling this morning.  He was found pulseless with initial cardiac rhythm asystole.  EMS administered 3 doses of epi/2 mg of narcan .  Following administration of narcan  ROSC achieved.  Estimated downtime 15 minutes.  EMS reported en route to the ER pt became agitated and attempted to remove Igel.  EMS administered versed .  Pt became pulseless again en route ACLS protocol initiated.  ED Course  Upon arrival to the ER pt remained pulseless in PEA.  ACLS protocol continued pt received: 2 mg of epi/4 mg of iv narcan /1 amp of bicarb/1 g of calcium .  Igel exchanged for an ETT with ongoing CPR (pt did not require RSI medications for intubation).  ROSC achieved with estimated downtime during 2nd cardiac arrest 15 minutes.  CTA Chest negative for PE.  Significant lab results were: Na+ 129/K+ 5.3/chloride 90/glucose 110/BUN 49/creatinine 2.22/calcium  7.5/mag 3.2/alk phos 134/AST 3,301/ALT 2,038/total bilirubin 5.5/troponin 344/lactic acid 8.1/urine drug screen negative/UA concerning for possible UTI.  Sepsis protocol initiated pt to receive: cefepime /metronidazole /vancomycin .  Pt found to be severely hypoglycemic CBG 17 requiring 1 amp of D50W.  Levophed  gtt initiated due to hypotension.  PCCM team contacted for ICU admission.  Pt remains in police custody with bilateral ankle cuffs in place, and police officer at bedside.   Pertinent  Medical History  Schizophrenia  Depression Current Smoker  Chronic Systolic CHF (Echo 09/11/22: EF <20%, grade II diastolic dysfunction, trivial pericardial effusion, mild mitral valve regurgitation, trivial tricuspid valve regurgitation,  trivial pulmonic valve regurgitation) CVA with aphasia  Micro Data:   MRSA PCR 06/26>>negative  COVID/Influenza A&B/RSV 06/26>>negative  RVP 06/26>>negative  Blood 06/26>>NGTD  Tracheal aspirate 06/26>>H influenza and strep pneumoniae  Urine 06/26>>less than 10,000 colonies/ml insignificant growth  Legionella pneum ur ag 06/26>>negative   Anti-infectives (From admission, onward)    Start     Dose/Rate Route Frequency Ordered Stop   03/04/24 2200  micafungin  (MYCAMINE ) 100 mg in sodium chloride  0.9 % 100 mL IVPB        100 mg 105 mL/hr over 1 Hours Intravenous Daily 03/04/24 1936     03/03/24 0600  ceFAZolin  (ANCEF ) IVPB 2g/100 mL premix        2 g 200 mL/hr over 30 Minutes Intravenous On call to O.R. 03/02/24 1432 03/04/24 0559   02/28/24 1000  piperacillin -tazobactam (ZOSYN ) IVPB 3.375 g  Status:  Discontinued        3.375 g 12.5 mL/hr over 240 Minutes Intravenous Every 8 hours 02/28/24 0815 03/03/24 1021   02/22/24 1100  cefTRIAXone  (ROCEPHIN ) 2 g in sodium chloride  0.9 % 100 mL IVPB        2 g 200 mL/hr over 30 Minutes Intravenous Every 24 hours 02/22/24 0952 02/27/24 1134   02/22/24 1100  azithromycin  (ZITHROMAX ) 500 mg in sodium chloride  0.9 % 250 mL IVPB  Status:  Discontinued        500 mg 250 mL/hr over 60 Minutes Intravenous Every 24 hours 02/22/24 0952 02/24/24 1009   02/21/24 0000  ceFEPIme  (MAXIPIME ) 2 g in sodium chloride  0.9 % 100 mL IVPB  Status:  Discontinued  2 g 200 mL/hr over 30 Minutes Intravenous Every 12 hours 02/20/24 1051 02/22/24 0952   02/20/24 1200  vancomycin  (VANCOREADY) IVPB 2000 mg/400 mL        2,000 mg 200 mL/hr over 120 Minutes Intravenous  Once 02/20/24 1007 02/20/24 1400   02/20/24 1050  vancomycin  variable dose per unstable renal function (pharmacist dosing)  Status:  Discontinued         Does not apply See admin instructions 02/20/24 1051 02/21/24 0925   02/20/24 0845  ceFEPIme  (MAXIPIME ) 2 g in sodium chloride  0.9 % 100 mL IVPB         2 g 200 mL/hr over 30 Minutes Intravenous  Once 02/20/24 0838 02/20/24 1410   02/20/24 0845  metroNIDAZOLE  (FLAGYL ) IVPB 500 mg        500 mg 100 mL/hr over 60 Minutes Intravenous  Once 02/20/24 0838 02/20/24 1559   02/20/24 0845  vancomycin  (VANCOCIN ) IVPB 1000 mg/200 mL premix  Status:  Discontinued        1,000 mg 200 mL/hr over 60 Minutes Intravenous  Once 02/20/24 0838 02/20/24 1007      Significant Hospital Events: Including procedures, antibiotic start and stop dates in addition to other pertinent events   06/26: Pt admitted post cardiac arrest mechanically intubated with possible cardiogenic/possible septic shock with multiorgan failure requiring levophed  and dobutamine  gtts 06/27: on minimal sedation, discontinued this AM. Off nor-epi, continued on dobutamine  06/28: decreased dobutamine  to 2.5, remains off sedation 06/29: arouses to tactile and verbal stimuli, remains disoriented. Failed SBT this AM. Dobutamine  increased to 5 mcg overnight due to bradycardia 06/30: Pt remains mechanically intubated, however neuro exam precludes extubation.  He has not received sedation since 06/27. Dobutamine  discontinued  06/30: MRI Brain Since the previous MRI, the patient has developed chronic           encephalomalacia changes within the right medial occipital lobe compatible with an        interval  PCA distribution infarct. Interval development of encephalomalacia in area of        previously demonstrated left MCA distribution infarct, which was acute on the        previous MRI. 07/01: Overnight pt remained in atrial flutter with rvr hr 140 to 150's with possible ST elevation despite iv metoprolol .  Cardiologist Dr Darron notified recommended amiodarone  bolus followed by amiodarone  gtt for rate control.  However, this morning while on amiodarone  gtt pt became severely bradycardic hr as low as 27 bpm with associated hypotension, amiodarone  gtt discontinued he is now requiring low dose levophed   gtt. Self extubated respiratory status stable but remained delirious requiring precedex  gtt  07/02: No significant events noted overnight.  Remains on Precedex  due to delirium, currently protecting his airway.  Afebrile, hemodynamically stable, Levophed  weaned off, Bradycardia resolved, no reports of tachyarrhythmias.  Dobutamine  weaned to 2.5 and Coox remains stable at 83, will d/c Dobutamine .  Speech/PT/OT consulted.  Failed speech evaluation, plan to place Dobhoff as able, remove foley. 07/03: No significant events noted overnight. Remains on low dose precedex  (0.4 mcg) due to delirium.  Afebrile, hemodynamically stable, no vasopressors.  Coox 73 and Lactic 1.1 OFF of inotropes and OFF Levophed .  Dobhoff with difficultly functioning, will exchange it to a regular NGT for feeds and med administration.  Gentle diuresis with 20 mg IV Lasix  x1 dose.  Hgb decreased to 9 from 11.7, no signs of bleeding, will d/c Heparin  gtt given no thrombus on repeat Echo. 07/04: Overnight Levophed   reinitiated, Hgb dropped to 6.4 (no reports of bleeding from nursing, abdominal exam is benign), ordered for 1 unit pRBC's, Lovenox  d/c,  increase PPI to BID dosing.  With worsening Leukocytosis up to 15.5 from 12.8 and increased FiO2 requirements to up 10 L (overnight deep Nasotracheal suctioning yielded copious amounts of thick secretions) , will check PCT and start empiric Zosyn  for aspiration coverage.  WOB is improved from yesterday, on low dose Precedex  but more interactive today.  Again unable to complete speech evaluation, will attempt NG tube placement again. 07/05: Remains confused and delirious. HR in the 140s; unresponsive to amiodarone  bolus and infusion. Transfused 1 unit of PRBCs for Hg of 7.1. Started on valium  for agitation. 07/06: Intubated overnight due to worsening mental status and thick copious secretions 07/09: Pt remains mechanically intubated pending trach placement  7/9 s/p TRACH family updated  Interim  History / Subjective:  Remains critically ill Remains intubated Requires VENT support for survival Vent Mode: PRVC FiO2 (%):  [35 %] 35 % Set Rate:  [18 bmp] 18 bmp Vt Set:  [500 mL] 500 mL PEEP:  [5 cmH20] 5 cmH20 Plateau Pressure:  [18 cmH20] 18 cmH20   Objective    Blood pressure 116/64, pulse 64, temperature (!) 96.8 F (36 C), resp. rate 18, height 6' (1.829 m), weight 99.6 kg, SpO2 100%.    Vent Mode: PRVC FiO2 (%):  [35 %] 35 % Set Rate:  [18 bmp] 18 bmp Vt Set:  [500 mL] 500 mL PEEP:  [5 cmH20] 5 cmH20 Plateau Pressure:  [18 cmH20] 18 cmH20   Intake/Output Summary (Last 24 hours) at 03/05/2024 9287 Last data filed at 03/05/2024 0700 Gross per 24 hour  Intake 795.9 ml  Output 3810 ml  Net -3014.1 ml   Filed Weights   03/03/24 0500 03/04/24 0500 03/05/24 0500  Weight: 96.5 kg 99.2 kg 99.6 kg     REVIEW OF SYSTEMS  PATIENT IS UNABLE TO PROVIDE COMPLETE REVIEW OF SYSTEMS DUE TO SEVERE CRITICAL ILLNESS   PHYSICAL EXAMINATION:  GENERAL:critically ill appearing,  EYES: Pupils equal, round, reactive to light.  No scleral icterus.  MOUTH: Moist mucosal membrane. S/p trach NECK: Supple.  PULMONARY: Lungs clear to auscultation, +rhonchi, +wheezing CARDIOVASCULAR: S1 and S2.  Regular rate and rhythm GASTROINTESTINAL: Soft, nontender, -distended. Positive bowel sounds.  MUSCULOSKELETAL: No swelling, clubbing, or edema.  NEUROLOGIC: obtunded,sedated SKIN:normal, warm to touch, Capillary refill delayed  Pulses present bilaterally   Resolved problem list  Hyponatremia  Lactic acidosis  Hyperkalemia   Assessment and Plan  73 yo AAM admitted for Cardiac arrest (PEA) Cardiogenic/possible septic shock H INFL and STREP pneumonia with Acute on chronic systolic CHF Elevated troponin suspect secondary to demand ischemia,Possible mural thrombus,Atrial flutter with rvr ,Hx: CVA with aphasia, failure to wean from vent and s/p TRACH, patient with end stage schizophrenia    Severe ACUTE Hypoxic and Hypercapnic Respiratory Failure -continue Mechanical Ventilator support -Wean Fio2 and PEEP as tolerated -VAP/VENT bundle implementation - Wean PEEP & FiO2 as tolerated, maintain SpO2 > 88% - Head of bed elevated 30 degrees, VAP protocol in place - Plateau pressures less than 30 cm H20  - Intermittent chest x-ray & ABG PRN - Ensure adequate pulmonary hygiene  -will perform SAT/SBT when respiratory parameters are met CTA Chest PE 02/20/24: No evidence of acute pulmonary embolism. Rounded filling defect within the right atrial appendage, suspicious for thrombus. Echo 02/20/24: EF 20 to 25%  ACUTE SYSTOLIC CARDIAC FAILURE-  Vent support  -Lasix   as tolerated -follow up cardiac enzymes as indicated   NEUROLOGY ACUTE METABOLIC ENCEPHALOPATHY Acute metabolic encephalopathy: concerning for possible anoxic brain injury  Hx: Polysubstance abuse, depression, and schizoaffective disorder EEG 02/21/24: Study is suggestive of severe to profound diffuse encephalopathy. No seizures or epileptiform discharges were seen throughout the recording. MRI Brain 02/24/24: Since the previous MRI, the patient has developed chronic encephalomalacia changes within the right medial occipital lobe compatible with an interval PCA distribution infarct. Interval development of encephalomalacia in area of previously demonstrated left MCA distribution infarct, which was acute on the previous MRI. - Urine drug screen negative  - Correct metabolic derangements  - Maintain RASS goal of 0 to -1 - PAD protocol to maintain RASS goal: propofol  gtt and prn fentanyl   - Scheduled valium  and oxycodone  to assist with weaning continuous sedation  - WUA daily  - Avoid sedation medication as able   Acute kidney injury secondary to ATN~resolved   - Trend BMP  - Replace electrolytes as indicated  - Strict I&O's - Avoid nephrotic agents as able     Latest Ref Rng & Units 03/05/2024    4:01 AM 03/04/2024     4:00 AM 03/03/2024    3:10 AM  BMP  Glucose 70 - 99 mg/dL 863  95  882   BUN 8 - 23 mg/dL 42  34  44   Creatinine 0.61 - 1.24 mg/dL 8.97  8.83  8.52   Sodium 135 - 145 mmol/L 141  144  143   Potassium 3.5 - 5.1 mmol/L 4.1  3.8  3.6   Chloride 98 - 111 mmol/L 104  110  104   CO2 22 - 32 mmol/L 27  27  29    Calcium  8.9 - 10.3 mg/dL 7.8  7.7  7.8      Shock liver~improving   Elevated alk phos and total bilirubin~improving   CT Abd/Pelvis 06/26: Mild diffuse mural thickening, which may be seen in the setting of hypoperfusion. Small volume free fluid. HCV ab 06/26: reactive  - Trend hepatic function panel - Avoid hepatotoxic agents as able   ENDO - ICU hypoglycemic\Hyperglycemia protocol -check FSBS per protocol   GI GI PROPHYLAXIS as indicated NUTRITIONAL STATUS DIET-->TF's as tolerated Constipation protocol as indicated   ELECTROLYTES -follow labs as needed -replace as needed -pharmacy consultation and following  RESTRICTIVE TRANSFUSION PROTOCOL TRANSFUSION  IF HGB<7  or ACTIVE BLEEDING OR DX of ACUTE CORONARY SYNDROMES     Best Practice (right click and Reselect all SmartList Selections daily)   Diet/type: TF's DVT prophylaxis SCD's Pressure ulcer(s): N/A GI prophylaxis: PPI Lines: Central line (Right Brachial PICC line placed 03/03/24) Foley:  N/A Code Status:  full code Last date of multidisciplinary goals of care discussion [03/04/24]  Labs   CBC: Recent Labs  Lab 03/01/24 0455 03/02/24 0342 03/03/24 0310 03/04/24 0400 03/05/24 0401  WBC 18.1* 16.7* 22.4* 22.1* 21.6*  HGB 8.0* 8.0* 7.9* 8.1* 8.4*  HCT 24.6* 24.8* 24.6* 25.6* 26.7*  MCV 96.1 96.5 98.4 101.6* 98.9  PLT 364 372 388 418* 496*    Basic Metabolic Panel: Recent Labs  Lab 02/29/24 1433 03/01/24 0455 03/02/24 0342 03/03/24 0310 03/04/24 0400 03/05/24 0401  NA  --  141 142 143 144 141  K  --  4.7 3.5 3.6 3.8 4.1  CL  --  99 104 104 110 104  CO2  --  29 30 29 27 27    GLUCOSE  --  91 117* 117* 95 136*  BUN  --  56* 48* 44* 34* 42*  CREATININE  --  1.99* 1.78* 1.47* 1.16 1.02  CALCIUM   --  8.0* 7.9* 7.8* 7.7* 7.8*  MG  --  2.6* 2.5* 2.2 2.3 2.2  PHOS 4.1 4.5 2.5  --  2.4* 3.2   GFR: Estimated Creatinine Clearance: 81.2 mL/min (by C-G formula based on SCr of 1.02 mg/dL). Recent Labs  Lab 02/28/24 0306 02/28/24 0827 02/28/24 1318 02/29/24 0500 02/29/24 1432 02/29/24 1433 03/01/24 0455 03/02/24 0342 03/03/24 0310 03/04/24 0400 03/05/24 0401  PROCALCITON 0.80  --   --   --   --   --  0.40 0.37  --   --   --   WBC 15.5*  --   --    < >  --    < > 18.1* 16.7* 22.4* 22.1* 21.6*  LATICACIDVEN  --  1.3 1.2  --  1.0  --   --   --   --   --   --    < > = values in this interval not displayed.    Liver Function Tests: Recent Labs  Lab 02/28/24 0306 02/29/24 0500 03/01/24 0455 03/02/24 0342 03/04/24 0400  AST 76* 74*  --   --  57*  ALT 251* 221*  --   --  93*  ALKPHOS 62 62  --   --  56  BILITOT 1.2 1.4*  --   --  1.9*  PROT 5.8* 5.9*  --   --  6.1*  ALBUMIN 2.3* 2.5* 2.6* 2.2* 2.1*   No results for input(s): LIPASE, AMYLASE in the last 168 hours. No results for input(s): AMMONIA in the last 168 hours.  ABG    Component Value Date/Time   PHART 7.52 (H) 03/04/2024 0647   PCO2ART 40 03/04/2024 0647   PO2ART 74 (L) 03/04/2024 0647   HCO3 32.7 (H) 03/04/2024 0647   ACIDBASEDEF 1.1 02/20/2024 1452   O2SAT 97.9 03/04/2024 0647     Coagulation Profile: Recent Labs  Lab 03/02/24 0342 03/04/24 0039  INR 1.4* 1.4*    Cardiac Enzymes: No results for input(s): CKTOTAL, CKMB, CKMBINDEX, TROPONINI in the last 168 hours.  HbA1C: Hgb A1c MFr Bld  Date/Time Value Ref Range Status  07/14/2018 04:29 AM 5.8 (H) 4.8 - 5.6 % Final    Comment:    (NOTE) Pre diabetes:          5.7%-6.4% Diabetes:              >6.4% Glycemic control for   <7.0% adults with diabetes   07/13/2018 04:25 AM 6.2 (H) 4.8 - 5.6 % Final    Comment:     (NOTE) Pre diabetes:          5.7%-6.4% Diabetes:              >6.4% Glycemic control for   <7.0% adults with diabetes     CBG: Recent Labs  Lab 03/04/24 1622 03/04/24 1623 03/04/24 1943 03/04/24 1949 03/05/24 0001  GLUCAP 41* 81 35* 136* 129*     DVT/GI PRX  assessed I Assessed the need for Labs I Assessed the need for Foley I Assessed the need for Central Venous Line Family Discussion when available I Assessed the need for Mobilization I made an Assessment of medications to be adjusted accordingly Safety Risk assessment completed  CASE DISCUSSED IN MULTIDISCIPLINARY ROUNDS WITH ICU TEAM     Critical Care Time devoted to patient care services described in this  note is 55 minutes.  Critical care was necessary to treat /prevent imminent and life-threatening deterioration. Overall, patient is critically ill, prognosis is guarded.  Patient with Multiorgan failure and at high risk for cardiac arrest and death.    Nickolas Alm Cellar, M.D.  [facility]  Medical Director [facility] Medical Director [facility] Cardio-Pulmonary Department

## 2024-03-06 DIAGNOSIS — I469 Cardiac arrest, cause unspecified: Secondary | ICD-10-CM | POA: Diagnosis not present

## 2024-03-06 DIAGNOSIS — J9601 Acute respiratory failure with hypoxia: Secondary | ICD-10-CM | POA: Diagnosis not present

## 2024-03-06 DIAGNOSIS — R7989 Other specified abnormal findings of blood chemistry: Secondary | ICD-10-CM | POA: Diagnosis not present

## 2024-03-06 DIAGNOSIS — J9602 Acute respiratory failure with hypercapnia: Secondary | ICD-10-CM | POA: Diagnosis not present

## 2024-03-06 LAB — GLUCOSE, CAPILLARY
Glucose-Capillary: 112 mg/dL — ABNORMAL HIGH (ref 70–99)
Glucose-Capillary: 64 mg/dL — ABNORMAL LOW (ref 70–99)
Glucose-Capillary: 104 mg/dL — ABNORMAL HIGH (ref 70–99)
Glucose-Capillary: 22 mg/dL — CL (ref 70–99)
Glucose-Capillary: 79 mg/dL (ref 70–99)
Glucose-Capillary: 95 mg/dL (ref 70–99)
Glucose-Capillary: 127 mg/dL — ABNORMAL HIGH (ref 70–99)

## 2024-03-06 LAB — TRIGLYCERIDES: Triglycerides: 46 mg/dL (ref <150)

## 2024-03-06 LAB — CBC
HCT: 24.2 % — ABNORMAL LOW (ref 39.0–52.0)
Hemoglobin: 7.6 g/dL — ABNORMAL LOW (ref 13.0–17.0)
MCH: 30.6 pg (ref 26.0–34.0)
MCHC: 31.4 g/dL (ref 30.0–36.0)
MCV: 97.6 fL (ref 80.0–100.0)
Platelets: 524 K/uL — ABNORMAL HIGH (ref 150–400)
RBC: 2.48 MIL/uL — ABNORMAL LOW (ref 4.22–5.81)
RDW: 17.2 % — ABNORMAL HIGH (ref 11.5–15.5)
WBC: 19.2 K/uL — ABNORMAL HIGH (ref 4.0–10.5)
nRBC: 0.5 % — ABNORMAL HIGH (ref 0.0–0.2)

## 2024-03-06 LAB — BASIC METABOLIC PANEL WITH GFR
Anion gap: 8 (ref 5–15)
BUN: 48 mg/dL — ABNORMAL HIGH (ref 8–23)
CO2: 29 mmol/L (ref 22–32)
Calcium: 7.6 mg/dL — ABNORMAL LOW (ref 8.9–10.3)
Chloride: 107 mmol/L (ref 98–111)
Creatinine, Ser: 1.13 mg/dL (ref 0.61–1.24)
GFR, Estimated: >60 mL/min (ref >60)
Glucose, Bld: 117 mg/dL — ABNORMAL HIGH (ref 70–99)
Potassium: 3.9 mmol/L (ref 3.5–5.1)
Sodium: 144 mmol/L (ref 135–145)

## 2024-03-06 LAB — PHOSPHORUS: Phosphorus: 2.8 mg/dL (ref 2.5–4.6)

## 2024-03-06 LAB — MAGNESIUM: Magnesium: 2.2 mg/dL (ref 1.7–2.4)

## 2024-03-06 MED ORDER — FUROSEMIDE 10 MG/ML IJ SOLN
INTRAMUSCULAR | Status: AC
  Filled 2024-03-06: qty 4

## 2024-03-06 MED ORDER — DEXTROSE 50 % IV SOLN
25.0000 mL | Freq: Once | INTRAVENOUS | Status: AC
  Administered 2024-03-06: 25 mL via INTRAVENOUS
  Filled 2024-03-06: qty 50

## 2024-03-06 NOTE — Progress Notes (Signed)
 Pt blood sugar checked from PICC line. Glucometer read 22, but suspected to be diluted.  Rechecked and was 79.

## 2024-03-06 NOTE — Plan of Care (Signed)
  Problem: Clinical Measurements: Goal: Diagnostic test results will improve Outcome: Progressing   Problem: Nutrition: Goal: Adequate nutrition will be maintained Outcome: Progressing   Problem: Coping: Goal: Level of anxiety will decrease Outcome: Progressing   Problem: Pain Managment: Goal: General experience of comfort will improve and/or be controlled Outcome: Progressing   Problem: Skin Integrity: Goal: Risk for impaired skin integrity will decrease Outcome: Progressing

## 2024-03-06 NOTE — Progress Notes (Signed)
 NAME:  Miguel [name], MRN:  [id number], DOB:  August 25, 1951, LOS: 15 ADMISSION DATE:  02/20/2024, CONSULTATION DATE: 02/20/2024 REFERRING MD: Dr. Levander,  CHIEF COMPLAINT: Cardiac Arrest    History of Present Illness:  73 yo male who presented to [facility] ER via EMS from jail on 06/26 post cardiac arrest.  Per ER notes pts cellmate heard the pt gurgling this morning.  He was found pulseless with initial cardiac rhythm asystole.  EMS administered 3 doses of epi/2 mg of narcan .  Following administration of narcan  ROSC achieved.  Estimated downtime 15 minutes.  EMS reported en route to the ER pt became agitated and attempted to remove Igel.  EMS administered versed .  Pt became pulseless again en route ACLS protocol initiated.  ED Course  Upon arrival to the ER pt remained pulseless in PEA.  ACLS protocol continued pt received: 2 mg of epi/4 mg of iv narcan /1 amp of bicarb/1 g of calcium .  Igel exchanged for an ETT with ongoing CPR (pt did not require RSI medications for intubation).  ROSC achieved with estimated downtime during 2nd cardiac arrest 15 minutes.  CTA Chest negative for PE.  Significant lab results were: Na+ 129/K+ 5.3/chloride 90/glucose 110/BUN 49/creatinine 2.22/calcium  7.5/mag 3.2/alk phos 134/AST 3,301/ALT 2,038/total bilirubin 5.5/troponin 344/lactic acid 8.1/urine drug screen negative/UA concerning for possible UTI.  Sepsis protocol initiated pt to receive: cefepime /metronidazole /vancomycin .  Pt found to be severely hypoglycemic CBG 17 requiring 1 amp of D50W.  Levophed  gtt initiated due to hypotension.  PCCM team contacted for ICU admission.  Pt remains in police custody with bilateral ankle cuffs in place, and police officer at bedside.   Pertinent  Medical History  Schizophrenia  Depression Current Smoker  Chronic Systolic CHF (Echo 09/11/22: EF <20%, grade II diastolic dysfunction, trivial pericardial effusion, mild mitral valve regurgitation, trivial tricuspid valve regurgitation,  trivial pulmonic valve regurgitation) CVA with aphasia  Micro Data:   MRSA PCR 06/26>>negative  COVID/Influenza A&B/RSV 06/26>>negative  RVP 06/26>>negative  Blood 06/26>>NGTD  Tracheal aspirate 06/26>>H influenza and strep pneumoniae  Urine 06/26>>less than 10,000 colonies/ml insignificant growth  Legionella pneum ur ag 06/26>>negative   Anti-infectives (From admission, onward)    Start     Dose/Rate Route Frequency Ordered Stop   03/05/24 1200  fluconazole  (DIFLUCAN ) tablet 200 mg        200 mg Per Tube Daily 03/05/24 0834 03/12/24 0959   03/04/24 2200  micafungin  (MYCAMINE ) 100 mg in sodium chloride  0.9 % 100 mL IVPB  Status:  Discontinued        100 mg 105 mL/hr over 1 Hours Intravenous Daily 03/04/24 1936 03/05/24 0834   03/03/24 0600  ceFAZolin  (ANCEF ) IVPB 2g/100 mL premix        2 g 200 mL/hr over 30 Minutes Intravenous On call to O.R. 03/02/24 1432 03/04/24 0559   02/28/24 1000  piperacillin -tazobactam (ZOSYN ) IVPB 3.375 g  Status:  Discontinued        3.375 g 12.5 mL/hr over 240 Minutes Intravenous Every 8 hours 02/28/24 0815 03/03/24 1021   02/22/24 1100  cefTRIAXone  (ROCEPHIN ) 2 g in sodium chloride  0.9 % 100 mL IVPB        2 g 200 mL/hr over 30 Minutes Intravenous Every 24 hours 02/22/24 0952 02/27/24 1134   02/22/24 1100  azithromycin  (ZITHROMAX ) 500 mg in sodium chloride  0.9 % 250 mL IVPB  Status:  Discontinued        500 mg 250 mL/hr over 60 Minutes Intravenous Every 24 hours 02/22/24 0952  02/24/24 1009   02/21/24 0000  ceFEPIme  (MAXIPIME ) 2 g in sodium chloride  0.9 % 100 mL IVPB  Status:  Discontinued        2 g 200 mL/hr over 30 Minutes Intravenous Every 12 hours 02/20/24 1051 02/22/24 0952   02/20/24 1200  vancomycin  (VANCOREADY) IVPB 2000 mg/400 mL        2,000 mg 200 mL/hr over 120 Minutes Intravenous  Once 02/20/24 1007 02/20/24 1400   02/20/24 1050  vancomycin  variable dose per unstable renal function (pharmacist dosing)  Status:  Discontinued          Does not apply See admin instructions 02/20/24 1051 02/21/24 0925   02/20/24 0845  ceFEPIme  (MAXIPIME ) 2 g in sodium chloride  0.9 % 100 mL IVPB        2 g 200 mL/hr over 30 Minutes Intravenous  Once 02/20/24 0838 02/20/24 1410   02/20/24 0845  metroNIDAZOLE  (FLAGYL ) IVPB 500 mg        500 mg 100 mL/hr over 60 Minutes Intravenous  Once 02/20/24 0838 02/20/24 1559   02/20/24 0845  vancomycin  (VANCOCIN ) IVPB 1000 mg/200 mL premix  Status:  Discontinued        1,000 mg 200 mL/hr over 60 Minutes Intravenous  Once 02/20/24 0838 02/20/24 1007      Significant Hospital Events: Including procedures, antibiotic start and stop dates in addition to other pertinent events   06/26: Pt admitted post cardiac arrest mechanically intubated with possible cardiogenic/possible septic shock with multiorgan failure requiring levophed  and dobutamine  gtts 06/27: on minimal sedation, discontinued this AM. Off nor-epi, continued on dobutamine  06/28: decreased dobutamine  to 2.5, remains off sedation 06/29: arouses to tactile and verbal stimuli, remains disoriented. Failed SBT this AM. Dobutamine  increased to 5 mcg overnight due to bradycardia 06/30: Pt remains mechanically intubated, however neuro exam precludes extubation.  He has not received sedation since 06/27. Dobutamine  discontinued  06/30: MRI Brain Since the previous MRI, the patient has developed chronic           encephalomalacia changes within the right medial occipital lobe compatible with an        interval  PCA distribution infarct. Interval development of encephalomalacia in area of        previously demonstrated left MCA distribution infarct, which was acute on the        previous MRI. 07/01: Overnight pt remained in atrial flutter with rvr hr 140 to 150's with possible ST elevation despite iv metoprolol .  Cardiologist Dr Darron notified recommended amiodarone  bolus followed by amiodarone  gtt for rate control.  However, this morning while on amiodarone   gtt pt became severely bradycardic hr as low as 27 bpm with associated hypotension, amiodarone  gtt discontinued he is now requiring low dose levophed  gtt. Self extubated respiratory status stable but remained delirious requiring precedex  gtt  07/02: No significant events noted overnight.  Remains on Precedex  due to delirium, currently protecting his airway.  Afebrile, hemodynamically stable, Levophed  weaned off, Bradycardia resolved, no reports of tachyarrhythmias.  Dobutamine  weaned to 2.5 and Coox remains stable at 83, will d/c Dobutamine .  Speech/PT/OT consulted.  Failed speech evaluation, plan to place Dobhoff as able, remove foley. 07/03: No significant events noted overnight. Remains on low dose precedex  (0.4 mcg) due to delirium.  Afebrile, hemodynamically stable, no vasopressors.  Coox 73 and Lactic 1.1 OFF of inotropes and OFF Levophed .  Dobhoff with difficultly functioning, will exchange it to a regular NGT for feeds and med administration.  Gentle diuresis with  20 mg IV Lasix  x1 dose.  Hgb decreased to 9 from 11.7, no signs of bleeding, will d/c Heparin  gtt given no thrombus on repeat Echo. 07/04: Overnight Levophed  reinitiated, Hgb dropped to 6.4 (no reports of bleeding from nursing, abdominal exam is benign), ordered for 1 unit pRBC's, Lovenox  d/c,  increase PPI to BID dosing.  With worsening Leukocytosis up to 15.5 from 12.8 and increased FiO2 requirements to up 10 L (overnight deep Nasotracheal suctioning yielded copious amounts of thick secretions) , will check PCT and start empiric Zosyn  for aspiration coverage.  WOB is improved from yesterday, on low dose Precedex  but more interactive today.  Again unable to complete speech evaluation, will attempt NG tube placement again. 07/05: Remains confused and delirious. HR in the 140s; unresponsive to amiodarone  bolus and infusion. Transfused 1 unit of PRBCs for Hg of 7.1. Started on valium  for agitation. 07/06: Intubated overnight due to worsening  mental status and thick copious secretions 07/09: Pt remains mechanically intubated pending trach placement  7/9 s/p TRACH family updated 7/10-7/11 off sedation, remains obtunded  Interim History / Subjective:   VENT WEANING ATTEMPT  7/10 PATIENT UNABLE TO WEAN FROM VENT    Remains critically ill Remains intubated Requires VENT support for survival  Vent Mode: PRVC FiO2 (%):  [35 %] 35 % Set Rate:  [18 bmp] 18 bmp Vt Set:  [500 mL] 500 mL PEEP:  [5 cmH20] 5 cmH20 Plateau Pressure:  [17 cmH20-18 cmH20] 17 cmH20   Objective    Blood pressure (!) 117/57, pulse 73, temperature 99.7 F (37.6 C), resp. rate 18, height 6' (1.829 m), weight 92.6 kg, SpO2 100%.    Vent Mode: PRVC FiO2 (%):  [35 %] 35 % Set Rate:  [18 bmp] 18 bmp Vt Set:  [500 mL] 500 mL PEEP:  [5 cmH20] 5 cmH20 Plateau Pressure:  [17 cmH20-18 cmH20] 17 cmH20   Intake/Output Summary (Last 24 hours) at 03/06/2024 0737 Last data filed at 03/06/2024 0400 Gross per 24 hour  Intake 298.04 ml  Output 2085 ml  Net -1786.96 ml   Filed Weights   03/04/24 0500 03/05/24 0500 03/06/24 0500  Weight: 99.2 kg 99.6 kg 92.6 kg      REVIEW OF SYSTEMS  PATIENT IS UNABLE TO PROVIDE COMPLETE REVIEW OF SYSTEMS DUE TO SEVERE CRITICAL ILLNESS   PHYSICAL EXAMINATION:  GENERAL:critically ill appearing, EYES: Pupils equal, round, reactive to light.  No scleral icterus.  MOUTH: Moist mucosal membrane. S/p trach NECK: Supple.  PULMONARY: Lungs clear to auscultation, +rhonchi, +wheezing CARDIOVASCULAR: S1 and S2.  Regular rate and rhythm GASTROINTESTINAL: Soft, nontender, -distended. Positive bowel sounds.  MUSCULOSKELETAL: No swelling, clubbing, or edema.  NEUROLOGIC: obtunded SKIN:normal, warm to touch, Capillary refill delayed  Pulses present bilaterally   Resolved problem list  Hyponatremia  Lactic acidosis  Hyperkalemia   Assessment and Plan  73 yo AAM admitted for Cardiac arrest (PEA) Cardiogenic/possible  septic shock H INFL and STREP pneumonia with Acute on chronic systolic CHF Elevated troponin suspect secondary to demand ischemia,Possible mural thrombus,Atrial flutter with rvr ,Hx: CVA with aphasia, failure to wean from vent and s/p TRACH, patient with end stage schizophrenia     Severe ACUTE Hypoxic and Hypercapnic Respiratory Failure -continue Mechanical Ventilator support -Wean Fio2 and PEEP as tolerated -VAP/VENT bundle implementation - Wean PEEP & FiO2 as tolerated, maintain SpO2 > 88% - Head of bed elevated 30 degrees, VAP protocol in place - Plateau pressures less than 30 cm H20  - Intermittent chest  x-ray & ABG PRN - Ensure adequate pulmonary hygiene  -will perform SAT/SBT when respiratory parameters are met CTA Chest PE 02/20/24: No evidence of acute pulmonary embolism. Rounded filling defect within the right atrial appendage, suspicious for thrombus. Echo 02/20/24: EF 20 to 25%  CARDIAC FAILURE- EF 20% -Lasix  as tolerated -follow up cardiac enzymes as indicated   NEUROLOGY ACUTE METABOLIC ENCEPHALOPATHY Acute metabolic encephalopathy: concerning for possible anoxic brain injury  Hx: Polysubstance abuse, depression, and schizoaffective disorder EEG 02/21/24: Study is suggestive of severe to profound diffuse encephalopathy. No seizures or epileptiform discharges were seen throughout the recording. MRI Brain 02/24/24: Since the previous MRI, the patient has developed chronic encephalomalacia changes within the right medial occipital lobe compatible with an interval PCA distribution infarct. Interval development of encephalomalacia in area of previously demonstrated left MCA distribution infarct, which was acute on the previous MRI. - Urine drug screen negative  - Correct metabolic derangements  - Maintain RASS goal of 0 to -1 - PAD protocol to maintain RASS goal: propofol  gtt and prn fentanyl   - Scheduled valium  and oxycodone  to assist with weaning continuous sedation  -  WUA daily  - Avoid sedation medication as able    RENAL -continue Foley Catheter-assess need -Avoid nephrotoxic agents -Follow urine output, BMP -Ensure adequate renal perfusion, optimize oxygenation -Renal dose medications   Intake/Output Summary (Last 24 hours) at 03/06/2024 0739 Last data filed at 03/06/2024 0400 Gross per 24 hour  Intake 298.04 ml  Output 2085 ml  Net -1786.96 ml    ENDO - ICU hypoglycemic\Hyperglycemia protocol -check FSBS per protocol   GI GI PROPHYLAXIS as indicated NUTRITIONAL STATUS DIET-->TF's as tolerated Constipation protocol as indicated   ELECTROLYTES -follow labs as needed -replace as needed -pharmacy consultation and following  RESTRICTIVE TRANSFUSION PROTOCOL TRANSFUSION  IF HGB<7  or ACTIVE BLEEDING OR DX of ACUTE CORONARY SYNDROMES     Best Practice (right click and Reselect all SmartList Selections daily)   Diet/type: TF's DVT prophylaxis SCD's Pressure ulcer(s): N/A GI prophylaxis: PPI Lines: Central line (Right Brachial PICC line placed 03/03/24) Foley:  N/A Code Status:  full code Last date of multidisciplinary goals of care discussion [03/04/24]  Labs   CBC: Recent Labs  Lab 03/02/24 0342 03/03/24 0310 03/04/24 0400 03/05/24 0401 03/06/24 0415  WBC 16.7* 22.4* 22.1* 21.6* 19.2*  HGB 8.0* 7.9* 8.1* 8.4* 7.6*  HCT 24.8* 24.6* 25.6* 26.7* 24.2*  MCV 96.5 98.4 101.6* 98.9 97.6  PLT 372 388 418* 496* 524*    Basic Metabolic Panel: Recent Labs  Lab 03/01/24 0455 03/02/24 0342 03/03/24 0310 03/04/24 0400 03/05/24 0401 03/06/24 0415  NA 141 142 143 144 141 144  K 4.7 3.5 3.6 3.8 4.1 3.9  CL 99 104 104 110 104 107  CO2 29 30 29 27 27 29   GLUCOSE 91 117* 117* 95 136* 117*  BUN 56* 48* 44* 34* 42* 48*  CREATININE 1.99* 1.78* 1.47* 1.16 1.02 1.13  CALCIUM  8.0* 7.9* 7.8* 7.7* 7.8* 7.6*  MG 2.6* 2.5* 2.2 2.3 2.2 2.2  PHOS 4.5 2.5  --  2.4* 3.2 2.8   GFR: Estimated Creatinine Clearance: 65.8 mL/min  (by C-G formula based on SCr of 1.13 mg/dL). Recent Labs  Lab 02/28/24 0827 02/28/24 1318 02/29/24 0500 02/29/24 1432 02/29/24 1433 03/01/24 0455 03/02/24 0342 03/03/24 0310 03/04/24 0400 03/05/24 0401 03/06/24 0415  PROCALCITON  --   --   --   --   --  0.40 0.37  --   --   --   --  WBC  --   --    < >  --    < > 18.1* 16.7* 22.4* 22.1* 21.6* 19.2*  LATICACIDVEN 1.3 1.2  --  1.0  --   --   --   --   --   --   --    < > = values in this interval not displayed.    Liver Function Tests: Recent Labs  Lab 02/29/24 0500 03/01/24 0455 03/02/24 0342 03/04/24 0400  AST 74*  --   --  57*  ALT 221*  --   --  93*  ALKPHOS 62  --   --  56  BILITOT 1.4*  --   --  1.9*  PROT 5.9*  --   --  6.1*  ALBUMIN 2.5* 2.6* 2.2* 2.1*   No results for input(s): LIPASE, AMYLASE in the last 168 hours. No results for input(s): AMMONIA in the last 168 hours.  ABG    Component Value Date/Time   PHART 7.52 (H) 03/04/2024 0647   PCO2ART 40 03/04/2024 0647   PO2ART 74 (L) 03/04/2024 0647   HCO3 32.7 (H) 03/04/2024 0647   ACIDBASEDEF 1.1 02/20/2024 1452   O2SAT 97.9 03/04/2024 0647     Coagulation Profile: Recent Labs  Lab 03/02/24 0342 03/04/24 0039  INR 1.4* 1.4*    Cardiac Enzymes: No results for input(s): CKTOTAL, CKMB, CKMBINDEX, TROPONINI in the last 168 hours.  HbA1C: Hgb A1c MFr Bld  Date/Time Value Ref Range Status  07/14/2018 04:29 AM 5.8 (H) 4.8 - 5.6 % Final    Comment:    (NOTE) Pre diabetes:          5.7%-6.4% Diabetes:              >6.4% Glycemic control for   <7.0% adults with diabetes   07/13/2018 04:25 AM 6.2 (H) 4.8 - 5.6 % Final    Comment:    (NOTE) Pre diabetes:          5.7%-6.4% Diabetes:              >6.4% Glycemic control for   <7.0% adults with diabetes     CBG: Recent Labs  Lab 03/05/24 0001 03/05/24 0745 03/05/24 1132 03/05/24 1602 03/06/24 0417  GLUCAP 129* 106* 96 86 112*     DVT/GI PRX  assessed I Assessed the  need for Labs I Assessed the need for Foley I Assessed the need for Central Venous Line Family Discussion when available I Assessed the need for Mobilization I made an Assessment of medications to be adjusted accordingly Safety Risk assessment completed  CASE DISCUSSED IN MULTIDISCIPLINARY ROUNDS WITH ICU TEAM     Critical Care Time devoted to patient care services described in this note is 55 minutes.  Critical care was necessary to treat /prevent imminent and life-threatening deterioration. Overall, patient is critically ill, prognosis is guarded.  Patient with Multiorgan failure and at high risk for cardiac arrest and death.    Nickolas Alm Cellar, M.D.  [facility]  Medical Director [facility] Medical Director [facility] Cardio-Pulmonary Department

## 2024-03-06 NOTE — Plan of Care (Signed)
  Problem: Education: Goal: Knowledge of General Education information will improve Description: Including pain rating scale, medication(s)/side effects and non-pharmacologic comfort measures 03/06/2024 1801 by Fowlkes, Illona Bulman, RN Outcome: Not Progressing 03/06/2024 1759 by Fowlkes, Delisha Peaden, RN Outcome: Not Progressing   Problem: Health Behavior/Discharge Planning: Goal: Ability to manage health-related needs will improve 03/06/2024 1801 by Fowlkes, Zienna Ahlin, RN Outcome: Not Progressing 03/06/2024 1759 by Fowlkes, Yashika Mask, RN Outcome: Not Progressing   Problem: Clinical Measurements: Goal: Ability to maintain clinical measurements within normal limits will improve 03/06/2024 1801 by Fowlkes, Orlene Salmons, RN Outcome: Not Progressing 03/06/2024 1759 by Fowlkes, Gianna Calef, RN Outcome: Not Progressing Goal: Will remain free from infection 03/06/2024 1801 by Fowlkes, Shauntelle Jamerson, RN Outcome: Not Progressing 03/06/2024 1759 by Fowlkes, Triana Coover, RN Outcome: Not Progressing Goal: Diagnostic test results will improve 03/06/2024 1801 by Fowlkes, Denarius Sesler, RN Outcome: Not Progressing 03/06/2024 1759 by Fowlkes, Janaiyah Blackard, RN Outcome: Not Progressing Goal: Respiratory complications will improve 03/06/2024 1801 by Fowlkes, Teandre Hamre, RN Outcome: Not Progressing 03/06/2024 1759 by Fowlkes, Ilaria Much, RN Outcome: Not Progressing Goal: Cardiovascular complication will be avoided 03/06/2024 1801 by Fowlkes, Square Jowett, RN Outcome: Not Progressing 03/06/2024 1759 by Fowlkes, Merit Maybee, RN Outcome: Not Progressing   Problem: Activity: Goal: Risk for activity intolerance will decrease 03/06/2024 1801 by Fowlkes, Tayia Stonesifer, RN Outcome: Not Progressing 03/06/2024 1759 by Fowlkes, Sylas Twombly, RN Outcome: Not Progressing   Problem: Nutrition: Goal: Adequate nutrition will be maintained 03/06/2024 1801 by Fowlkes, Odile Veloso, RN Outcome: Not Progressing 03/06/2024 1759 by Fowlkes, Jarryn Altland, RN Outcome: Not  Progressing   Problem: Coping: Goal: Level of anxiety will decrease 03/06/2024 1801 by Fowlkes, Candyce Gambino, RN Outcome: Not Progressing 03/06/2024 1759 by Fowlkes, Cong Hightower, RN Outcome: Not Progressing   Problem: Elimination: Goal: Will not experience complications related to bowel motility 03/06/2024 1801 by Carilyn Bunk, RN Outcome: Not Progressing 03/06/2024 1759 by Fowlkes, Kolston Lacount, RN Outcome: Not Progressing Goal: Will not experience complications related to urinary retention 03/06/2024 1801 by Fowlkes, Lanice Folden, RN Outcome: Not Progressing 03/06/2024 1759 by Fowlkes, Malvern Kadlec, RN Outcome: Not Progressing   Problem: Pain Managment: Goal: General experience of comfort will improve and/or be controlled 03/06/2024 1801 by Fowlkes, Erryn Dickison, RN Outcome: Not Progressing 03/06/2024 1759 by Fowlkes, Deyon Chizek, RN Outcome: Not Progressing   Problem: Safety: Goal: Ability to remain free from injury will improve 03/06/2024 1801 by Fowlkes, Kyllian Clingerman, RN Outcome: Not Progressing 03/06/2024 1759 by Fowlkes, Eriq Hufford, RN Outcome: Not Progressing   Problem: Skin Integrity: Goal: Risk for impaired skin integrity will decrease 03/06/2024 1801 by Carilyn Bunk, RN Outcome: Not Progressing 03/06/2024 1759 by Fowlkes, Zuriyah Shatz, RN Outcome: Not Progressing   Problem: Education: Goal: Ability to manage disease process will improve 03/06/2024 1801 by Fowlkes, Deedee Lybarger, RN Outcome: Not Progressing 03/06/2024 1759 by Fowlkes, Ashawna Hanback, RN Outcome: Not Progressing   Problem: Cardiac: Goal: Ability to achieve and maintain adequate cardiopulmonary perfusion will improve 03/06/2024 1801 by Fowlkes, Tylyn Stankovich, RN Outcome: Not Progressing 03/06/2024 1759 by Fowlkes, Atanacio Melnyk, RN Outcome: Not Progressing   Problem: Neurologic: Goal: Promote progressive neurologic recovery 03/06/2024 1801 by Fowlkes, Ryelan Kazee, RN Outcome: Not Progressing 03/06/2024 1759 by Fowlkes, Tidus Upchurch, RN Outcome: Not  Progressing   Problem: Skin Integrity: Goal: Risk for impaired skin integrity will be minimized. 03/06/2024 1801 by Fowlkes, Laterrian Hevener, RN Outcome: Not Progressing 03/06/2024 1759 by Fowlkes, Caitlyne Ingham, RN Outcome: Not Progressing

## 2024-03-06 NOTE — Progress Notes (Signed)
 PHARMACY CONSULT NOTE  Pharmacy Consult for Electrolyte Monitoring and Replacement   Recent Labs: Potassium (mmol/L)  Date Value  03/06/2024 3.9  09/15/2012 4.4   Magnesium  (mg/dL)  Date Value  92/88/7974 2.2   Calcium  (mg/dL)  Date Value  92/88/7974 7.6 (L)   Calcium , Total (mg/dL)  Date Value  98/79/7985 9.4   Albumin (g/dL)  Date Value  92/90/7974 2.1 (L)  09/15/2012 4.2   Phosphorus (mg/dL)  Date Value  92/88/7974 2.8   Sodium (mmol/L)  Date Value  03/06/2024 144  09/15/2012 146 (H)   Assessment: 73 y/o male with h/o CHF, dilated cardiomyopathy, substance abuse, schizophrenia, CVA, depression, HCV, homelessness and recently in jail who is admitted with PEA arrest, AKI, CHF with bilateral pleural effusions, thrombus, shock, H influenza and strep pneumoniae and sepsis. Pharmacy is asked to follow and replace electrolytes while in CCU  Goal of Therapy:  Potassium 4.0 - 5.1 mmol/L Magnesium  2.0 - 2.4 mg/dL All Other Electrolytes WNL  Plan:  --Sign off on consult given stability --Will continue to monitor peripherally  Miguel [name] 03/06/2024 8:00 AM

## 2024-03-07 LAB — MAGNESIUM: Magnesium: 2.2 mg/dL (ref 1.7–2.4)

## 2024-03-07 LAB — CBC
HCT: 25.9 % — ABNORMAL LOW (ref 39.0–52.0)
Hemoglobin: 8 g/dL — ABNORMAL LOW (ref 13.0–17.0)
MCH: 31.4 pg (ref 26.0–34.0)
MCHC: 30.9 g/dL (ref 30.0–36.0)
MCV: 101.6 fL — ABNORMAL HIGH (ref 80.0–100.0)
Platelets: 521 K/uL — ABNORMAL HIGH (ref 150–400)
RBC: 2.55 MIL/uL — ABNORMAL LOW (ref 4.22–5.81)
RDW: 17.2 % — ABNORMAL HIGH (ref 11.5–15.5)
WBC: 16.3 K/uL — ABNORMAL HIGH (ref 4.0–10.5)
nRBC: 0.4 % — ABNORMAL HIGH (ref 0.0–0.2)

## 2024-03-07 LAB — GLUCOSE, CAPILLARY
Glucose-Capillary: 119 mg/dL — ABNORMAL HIGH (ref 70–99)
Glucose-Capillary: 130 mg/dL — ABNORMAL HIGH (ref 70–99)
Glucose-Capillary: 141 mg/dL — ABNORMAL HIGH (ref 70–99)
Glucose-Capillary: 125 mg/dL — ABNORMAL HIGH (ref 70–99)

## 2024-03-07 LAB — BASIC METABOLIC PANEL WITH GFR
Anion gap: 5 (ref 5–15)
BUN: 49 mg/dL — ABNORMAL HIGH (ref 8–23)
CO2: 32 mmol/L (ref 22–32)
Calcium: 8 mg/dL — ABNORMAL LOW (ref 8.9–10.3)
Chloride: 107 mmol/L (ref 98–111)
Creatinine, Ser: 1.31 mg/dL — ABNORMAL HIGH (ref 0.61–1.24)
GFR, Estimated: 58 mL/min — ABNORMAL LOW (ref >60)
Glucose, Bld: 134 mg/dL — ABNORMAL HIGH (ref 70–99)
Potassium: 4.7 mmol/L (ref 3.5–5.1)
Sodium: 144 mmol/L (ref 135–145)

## 2024-03-07 MED ORDER — PROPOFOL 1000 MG/100ML IV EMUL
INTRAVENOUS | Status: AC
  Administered 2024-03-07: 15 ug/kg/min via INTRAVENOUS
  Filled 2024-03-07: qty 100

## 2024-03-07 MED ORDER — PROPOFOL 1000 MG/100ML IV EMUL
0.0000 ug/kg/min | INTRAVENOUS | Status: DC
  Administered 2024-03-08: 15 ug/kg/min via INTRAVENOUS
  Filled 2024-03-07: qty 100

## 2024-03-07 NOTE — Progress Notes (Addendum)
 Patient not synchronized with vent. Suctioned, repositioned without improvement. MD Kasa notified by this RN regarding current patient status. New orders placed for continuous sedation by MD. RT also at bedside assessing and assisting patient. SBT failed per MD.

## 2024-03-07 NOTE — Plan of Care (Signed)
  Problem: Clinical Measurements: Goal: Cardiovascular complication will be avoided Outcome: Progressing   Problem: Nutrition: Goal: Adequate nutrition will be maintained Outcome: Progressing   Problem: Elimination: Goal: Will not experience complications related to bowel motility Outcome: Progressing Goal: Will not experience complications related to urinary retention Outcome: Progressing   Problem: Pain Managment: Goal: General experience of comfort will improve and/or be controlled Outcome: Progressing   Problem: Safety: Goal: Ability to remain free from injury will improve Outcome: Progressing   Problem: Skin Integrity: Goal: Risk for impaired skin integrity will decrease Outcome: Progressing   Problem: Cardiac: Goal: Ability to achieve and maintain adequate cardiopulmonary perfusion will improve Outcome: Progressing   Problem: Skin Integrity: Goal: Risk for impaired skin integrity will be minimized. Outcome: Progressing   Problem: Education: Goal: Knowledge of General Education information will improve Description: Including pain rating scale, medication(s)/side effects and non-pharmacologic comfort measures Outcome: Not Progressing   Problem: Health Behavior/Discharge Planning: Goal: Ability to manage health-related needs will improve Outcome: Not Progressing   Problem: Clinical Measurements: Goal: Ability to maintain clinical measurements within normal limits will improve Outcome: Not Progressing Goal: Respiratory complications will improve Outcome: Not Progressing   Problem: Activity: Goal: Risk for activity intolerance will decrease Outcome: Not Progressing   Problem: Coping: Goal: Level of anxiety will decrease Outcome: Not Progressing   Problem: Neurologic: Goal: Promote progressive neurologic recovery Outcome: Not Progressing

## 2024-03-07 NOTE — Plan of Care (Signed)
  Problem: Clinical Measurements: Goal: Ability to maintain clinical measurements within normal limits will improve Outcome: Progressing Goal: Will remain free from infection Outcome: Progressing Goal: Diagnostic test results will improve Outcome: Progressing Goal: Respiratory complications will improve Outcome: Progressing Goal: Cardiovascular complication will be avoided Outcome: Progressing   Problem: Activity: Goal: Risk for activity intolerance will decrease Outcome: Progressing   Problem: Nutrition: Goal: Adequate nutrition will be maintained Outcome: Progressing   Problem: Pain Managment: Goal: General experience of comfort will improve and/or be controlled Outcome: Progressing

## 2024-03-07 NOTE — Progress Notes (Signed)
 Brother Cherene called provided password. Patient update given within this RN scope of practice.

## 2024-03-08 DIAGNOSIS — J9602 Acute respiratory failure with hypercapnia: Secondary | ICD-10-CM | POA: Diagnosis not present

## 2024-03-08 DIAGNOSIS — R7989 Other specified abnormal findings of blood chemistry: Secondary | ICD-10-CM | POA: Diagnosis not present

## 2024-03-08 DIAGNOSIS — I469 Cardiac arrest, cause unspecified: Secondary | ICD-10-CM | POA: Diagnosis not present

## 2024-03-08 DIAGNOSIS — J9601 Acute respiratory failure with hypoxia: Secondary | ICD-10-CM | POA: Diagnosis not present

## 2024-03-08 LAB — GLUCOSE, CAPILLARY
Glucose-Capillary: 111 mg/dL — ABNORMAL HIGH (ref 70–99)
Glucose-Capillary: 115 mg/dL — ABNORMAL HIGH (ref 70–99)
Glucose-Capillary: 126 mg/dL — ABNORMAL HIGH (ref 70–99)
Glucose-Capillary: 127 mg/dL — ABNORMAL HIGH (ref 70–99)
Glucose-Capillary: 128 mg/dL — ABNORMAL HIGH (ref 70–99)
Glucose-Capillary: 139 mg/dL — ABNORMAL HIGH (ref 70–99)
Glucose-Capillary: 142 mg/dL — ABNORMAL HIGH (ref 70–99)

## 2024-03-08 LAB — BASIC METABOLIC PANEL WITH GFR
Anion gap: 5 (ref 5–15)
BUN: 46 mg/dL — ABNORMAL HIGH (ref 8–23)
CO2: 30 mmol/L (ref 22–32)
Calcium: 7.8 mg/dL — ABNORMAL LOW (ref 8.9–10.3)
Chloride: 107 mmol/L (ref 98–111)
Creatinine, Ser: 1.07 mg/dL (ref 0.61–1.24)
GFR, Estimated: >60 mL/min (ref >60)
Glucose, Bld: 159 mg/dL — ABNORMAL HIGH (ref 70–99)
Potassium: 4.3 mmol/L (ref 3.5–5.1)
Sodium: 142 mmol/L (ref 135–145)

## 2024-03-08 LAB — CBC
HCT: 25.6 % — ABNORMAL LOW (ref 39.0–52.0)
Hemoglobin: 7.8 g/dL — ABNORMAL LOW (ref 13.0–17.0)
MCH: 31.5 pg (ref 26.0–34.0)
MCHC: 30.5 g/dL (ref 30.0–36.0)
MCV: 103.2 fL — ABNORMAL HIGH (ref 80.0–100.0)
Platelets: 519 K/uL — ABNORMAL HIGH (ref 150–400)
RBC: 2.48 MIL/uL — ABNORMAL LOW (ref 4.22–5.81)
RDW: 17.1 % — ABNORMAL HIGH (ref 11.5–15.5)
WBC: 14.5 K/uL — ABNORMAL HIGH (ref 4.0–10.5)
nRBC: 1 % — ABNORMAL HIGH (ref 0.0–0.2)

## 2024-03-08 LAB — MAGNESIUM: Magnesium: 2.1 mg/dL (ref 1.7–2.4)

## 2024-03-08 LAB — TRIGLYCERIDES: Triglycerides: 34 mg/dL (ref <150)

## 2024-03-08 NOTE — Plan of Care (Signed)
  Problem: Clinical Measurements: Goal: Ability to maintain clinical measurements within normal limits will improve Outcome: Progressing Goal: Will remain free from infection Outcome: Not Progressing Goal: Diagnostic test results will improve Outcome: Progressing Goal: Respiratory complications will improve Outcome: Progressing Goal: Cardiovascular complication will be avoided Outcome: Progressing   Problem: Activity: Goal: Risk for activity intolerance will decrease Outcome: Progressing   Problem: Nutrition: Goal: Adequate nutrition will be maintained Outcome: Progressing   Problem: Coping: Goal: Level of anxiety will decrease Outcome: Progressing   Problem: Elimination: Goal: Will not experience complications related to bowel motility Outcome: Not Progressing Goal: Will not experience complications related to urinary retention Outcome: Progressing   Problem: Pain Managment: Goal: General experience of comfort will improve and/or be controlled Outcome: Progressing   Problem: Safety: Goal: Ability to remain free from injury will improve Outcome: Progressing   Problem: Skin Integrity: Goal: Risk for impaired skin integrity will decrease Outcome: Not Progressing   Problem: Neurologic: Goal: Promote progressive neurologic recovery Outcome: Progressing

## 2024-03-08 NOTE — Plan of Care (Signed)
  Problem: Clinical Measurements: Goal: Ability to maintain clinical measurements within normal limits will improve Outcome: Progressing Goal: Will remain free from infection Outcome: Progressing Goal: Diagnostic test results will improve Outcome: Progressing Goal: Respiratory complications will improve Outcome: Not Progressing Goal: Cardiovascular complication will be avoided Outcome: Progressing   Problem: Nutrition: Goal: Adequate nutrition will be maintained Outcome: Progressing   Problem: Coping: Goal: Level of anxiety will decrease Outcome: Not Progressing   Problem: Elimination: Goal: Will not experience complications related to bowel motility Outcome: Not Progressing Goal: Will not experience complications related to urinary retention Outcome: Progressing   Problem: Pain Managment: Goal: General experience of comfort will improve and/or be controlled Outcome: Progressing   Problem: Safety: Goal: Ability to remain free from injury will improve Outcome: Progressing   Problem: Education: Goal: Ability to manage disease process will improve Outcome: Not Progressing   Problem: Cardiac: Goal: Ability to achieve and maintain adequate cardiopulmonary perfusion will improve Outcome: Progressing   Problem: Neurologic: Goal: Promote progressive neurologic recovery Outcome: Not Progressing   Problem: Skin Integrity: Goal: Risk for impaired skin integrity will be minimized. Outcome: Not Progressing

## 2024-03-08 NOTE — Progress Notes (Signed)
 NAME:  Miguel [name], MRN:  [id number], DOB:  1951/05/22, LOS: 17 ADMISSION DATE:  02/20/2024, CONSULTATION DATE: 02/20/2024 REFERRING MD: Dr. Levander,  CHIEF COMPLAINT: Cardiac Arrest    History of Present Illness:  73 yo male who [facility] to [facility] ER [facility] from jail on 06/26 post cardiac arrest.  Per ER notes pts cellmate heard the pt gurgling this morning.  He was found pulseless with initial cardiac rhythm asystole.  EMS administered 3 doses of epi/2 mg of narcan .  Following administration of narcan  ROSC achieved.  Estimated downtime 15 minutes.  EMS reported en route to the ER pt became agitated and attempted to remove Igel.  EMS administered versed .  Pt became pulseless again en route ACLS protocol initiated.  ED Course  Upon arrival to the ER pt remained pulseless in PEA.  ACLS protocol continued pt received: 2 mg of epi/4 mg of iv narcan /1 amp of bicarb/1 g of calcium .  Igel exchanged for an ETT with ongoing CPR (pt did not require RSI medications for intubation).  ROSC achieved with estimated downtime during 2nd cardiac arrest 15 minutes.  CTA Chest negative for PE.  Significant lab results were: Na+ 129/K+ 5.3/chloride 90/glucose 110/BUN 49/creatinine 2.22/calcium  7.5/mag 3.2/alk phos 134/AST 3,301/ALT 2,038/total bilirubin 5.5/troponin 344/lactic acid 8.1/urine drug screen negative/UA concerning for possible UTI.  Sepsis protocol initiated pt to receive: cefepime /metronidazole /vancomycin .  Pt found to be severely hypoglycemic CBG 17 requiring 1 amp of D50W.  Levophed  gtt initiated due to hypotension.  PCCM team contacted for ICU admission.  Pt remains in police custody with bilateral ankle cuffs in place, and police officer at bedside.   Pertinent  Medical History  Schizophrenia  Depression Current Smoker  Chronic Systolic CHF (Echo 09/11/22: EF <20%, grade II diastolic dysfunction, trivial pericardial effusion, mild mitral valve regurgitation, trivial tricuspid valve regurgitation,  trivial pulmonic valve regurgitation) CVA with aphasia  Micro Data:   MRSA PCR 06/26>>negative  COVID/Influenza A&B/RSV 06/26>>negative  RVP 06/26>>negative  Blood 06/26>>NGTD  Tracheal aspirate 06/26>>H influenza and strep pneumoniae  Urine 06/26>>less than 10,000 colonies/ml insignificant growth  Legionella pneum ur ag 06/26>>negative   Anti-infectives (From admission, onward)    Start     Dose/Rate Route Frequency Ordered Stop   03/05/24 1200  fluconazole  (DIFLUCAN ) tablet 200 mg        200 mg Per Tube Daily 03/05/24 0834 03/12/24 0959   03/04/24 2200  micafungin  (MYCAMINE ) 100 mg in sodium chloride  0.9 % 100 mL IVPB  Status:  Discontinued        100 mg 105 mL/hr over 1 Hours Intravenous Daily 03/04/24 1936 03/05/24 0834   03/03/24 0600  ceFAZolin  (ANCEF ) IVPB 2g/100 mL premix        2 g 200 mL/hr over 30 Minutes Intravenous On call to O.R. 03/02/24 1432 03/04/24 0559   02/28/24 1000  piperacillin -tazobactam (ZOSYN ) IVPB 3.375 g  Status:  Discontinued        3.375 g 12.5 mL/hr over 240 Minutes Intravenous Every 8 hours 02/28/24 0815 03/03/24 1021   02/22/24 1100  cefTRIAXone  (ROCEPHIN ) 2 g in sodium chloride  0.9 % 100 mL IVPB        2 g 200 mL/hr over 30 Minutes Intravenous Every 24 hours 02/22/24 0952 02/27/24 1134   02/22/24 1100  azithromycin  (ZITHROMAX ) 500 mg in sodium chloride  0.9 % 250 mL IVPB  Status:  Discontinued        500 mg 250 mL/hr over 60 Minutes Intravenous Every 24 hours 02/22/24 0952  02/24/24 1009   02/21/24 0000  ceFEPIme  (MAXIPIME ) 2 g in sodium chloride  0.9 % 100 mL IVPB  Status:  Discontinued        2 g 200 mL/hr over 30 Minutes Intravenous Every 12 hours 02/20/24 1051 02/22/24 0952   02/20/24 1200  vancomycin  (VANCOREADY) IVPB 2000 mg/400 mL        2,000 mg 200 mL/hr over 120 Minutes Intravenous  Once 02/20/24 1007 02/20/24 1400   02/20/24 1050  vancomycin  variable dose per unstable renal function (pharmacist dosing)  Status:  Discontinued          Does not apply See admin instructions 02/20/24 1051 02/21/24 0925   02/20/24 0845  ceFEPIme  (MAXIPIME ) 2 g in sodium chloride  0.9 % 100 mL IVPB        2 g 200 mL/hr over 30 Minutes Intravenous  Once 02/20/24 0838 02/20/24 1410   02/20/24 0845  metroNIDAZOLE  (FLAGYL ) IVPB 500 mg        500 mg 100 mL/hr over 60 Minutes Intravenous  Once 02/20/24 0838 02/20/24 1559   02/20/24 0845  vancomycin  (VANCOCIN ) IVPB 1000 mg/200 mL premix  Status:  Discontinued        1,000 mg 200 mL/hr over 60 Minutes Intravenous  Once 02/20/24 0838 02/20/24 1007      Significant Hospital Events: Including procedures, antibiotic start and stop dates in addition to other pertinent events   06/26: Pt admitted post cardiac arrest mechanically intubated with possible cardiogenic/possible septic shock with multiorgan failure requiring levophed  and dobutamine  gtts 06/27: on minimal sedation, discontinued this AM. Off nor-epi, continued on dobutamine  06/28: decreased dobutamine  to 2.5, remains off sedation 06/29: arouses to tactile and verbal stimuli, remains disoriented. Failed SBT this AM. Dobutamine  increased to 5 mcg overnight due to bradycardia 06/30: Pt remains mechanically intubated, however neuro exam precludes extubation.  He has not received sedation since 06/27. Dobutamine  discontinued  06/30: MRI Brain Since the previous MRI, the patient has developed chronic           encephalomalacia changes within the right medial occipital lobe compatible with an        interval  PCA distribution infarct. Interval development of encephalomalacia in area of        previously demonstrated left MCA distribution infarct, which was acute on the        previous MRI. 07/01: Overnight pt remained in atrial flutter with rvr hr 140 to 150's with possible ST elevation despite iv metoprolol .  Cardiologist Dr Darron notified recommended amiodarone  bolus followed by amiodarone  gtt for rate control.  However, this morning while on amiodarone   gtt pt became severely bradycardic hr as low as 27 bpm with associated hypotension, amiodarone  gtt discontinued he is now requiring low dose levophed  gtt. Self extubated respiratory status stable but remained delirious requiring precedex  gtt  07/02: No significant events noted overnight.  Remains on Precedex  due to delirium, currently protecting his airway.  Afebrile, hemodynamically stable, Levophed  weaned off, Bradycardia resolved, no reports of tachyarrhythmias.  Dobutamine  weaned to 2.5 and Coox remains stable at 83, will d/c Dobutamine .  Speech/PT/OT consulted.  Failed speech evaluation, plan to place Dobhoff as able, remove foley. 07/03: No significant events noted overnight. Remains on low dose precedex  (0.4 mcg) due to delirium.  Afebrile, hemodynamically stable, no vasopressors.  Coox 73 and Lactic 1.1 OFF of inotropes and OFF Levophed .  Dobhoff with difficultly functioning, will exchange it to a regular NGT for feeds and med administration.  Gentle diuresis with  20 mg IV Lasix  x1 dose.  Hgb decreased to 9 from 11.7, no signs of bleeding, will d/c Heparin  gtt given no thrombus on repeat Echo. 07/04: Overnight Levophed  reinitiated, Hgb dropped to 6.4 (no reports of bleeding from nursing, abdominal exam is benign), ordered for 1 unit pRBC's, Lovenox  d/c,  increase PPI to BID dosing.  With worsening Leukocytosis up to 15.5 from 12.8 and increased FiO2 requirements to up 10 L (overnight deep Nasotracheal suctioning yielded copious amounts of thick secretions) , will check PCT and start empiric Zosyn  for aspiration coverage.  WOB is improved from yesterday, on low dose Precedex  but more interactive today.  Again unable to complete speech evaluation, will attempt NG tube placement again. 07/05: Remains confused and delirious. HR in the 140s; unresponsive to amiodarone  bolus and infusion. Transfused 1 unit of PRBCs for Hg of 7.1. Started on valium  for agitation. 07/06: Intubated overnight due to worsening  mental status and thick copious secretions 07/09: Pt remains mechanically intubated pending trach placement  7/9 s/p TRACH family updated 7/10-7/11 off sedation, remains obtunded 7/12 failed vent weaning trial  Interim History / Subjective:   VENT WEANING ATTEMPT  7/10 PATIENT UNABLE TO WEAN FROM VENT 7/13 patient with increased WOB with weaning trial     Remains critically ill Remains s/p trach Requires VENT support for survival  Vent Mode: PRVC FiO2 (%):  [28 %-30 %] 28 % Set Rate:  [18 bmp] 18 bmp Vt Set:  [500 mL] 500 mL PEEP:  [5 cmH20] 5 cmH20 Plateau Pressure:  [15 cmH20] 15 cmH20   Objective    Blood pressure (!) 113/59, pulse 87, temperature 98.4 F (36.9 C), resp. rate 20, height 6' (1.829 m), weight 91 kg, SpO2 100%.    Vent Mode: PRVC FiO2 (%):  [28 %-30 %] 28 % Set Rate:  [18 bmp] 18 bmp Vt Set:  [500 mL] 500 mL PEEP:  [5 cmH20] 5 cmH20 Plateau Pressure:  [15 cmH20] 15 cmH20   Intake/Output Summary (Last 24 hours) at 03/08/2024 0701 Last data filed at 03/08/2024 9389 Gross per 24 hour  Intake 2140.69 ml  Output 2355 ml  Net -214.31 ml   Filed Weights   03/06/24 0500 03/07/24 0500 03/08/24 0400  Weight: 92.6 kg 91 kg 91 kg      REVIEW OF SYSTEMS  PATIENT IS UNABLE TO PROVIDE COMPLETE REVIEW OF SYSTEMS DUE TO SEVERE CRITICAL ILLNESS   PHYSICAL EXAMINATION:  GENERAL:critically ill appearing, +resp distress EYES: Pupils equal, round, reactive to light.  No scleral icterus.  MOUTH: Moist mucosal membrane. S/p trach NECK: Supple.  PULMONARY: Lungs clear to auscultation, +rhonchi, +wheezing CARDIOVASCULAR: S1 and S2.  Regular rate and rhythm GASTROINTESTINAL: Soft, nontender, -distended. Positive bowel sounds.  MUSCULOSKELETAL: No swelling, clubbing, or edema.  NEUROLOGIC: sedated SKIN:normal, warm to touch, Capillary refill delayed  Pulses present bilaterally   Resolved problem list  Hyponatremia  Lactic acidosis  Hyperkalemia    Assessment and Plan  73 yo AAM admitted for Cardiac arrest (PEA) Cardiogenic/possible septic shock H INFL and STREP pneumonia with Acute on chronic systolic CHF Elevated troponin suspect secondary to demand ischemia,Possible mural thrombus,Atrial flutter with rvr ,Hx: CVA with aphasia, failure to wean from vent and s/p TRACH, patient with end stage schizophrenia   Severe ACUTE Hypoxic and Hypercapnic Respiratory Failure -continue Mechanical Ventilator support -Wean Fio2 and PEEP as tolerated -VAP/VENT bundle implementation - Wean PEEP & FiO2 as tolerated, maintain SpO2 > 88% - Head of bed elevated 30 degrees, VAP  protocol in place - Plateau pressures less than 30 cm H20  - Intermittent chest x-ray & ABG PRN - Ensure adequate pulmonary hygiene  CTA Chest PE 02/20/24: No evidence of acute pulmonary embolism. Rounded filling defect within the right atrial appendage, suspicious for thrombus. Echo 02/20/24: EF 20 to 25%   VENT WEANING ATTEMPT  7/10 PATIENT UNABLE TO WEAN FROM VENT 7/13 patient with increased WOB with weaning trial   NEUROLOGY ACUTE METABOLIC ENCEPHALOPATHY Acute metabolic encephalopathy: concerning for possible anoxic brain injury  Hx: Polysubstance abuse, depression, and schizoaffective disorder EEG 02/21/24: Study is suggestive of severe to profound diffuse encephalopathy. No seizures or epileptiform discharges were seen throughout the recording. MRI Brain 02/24/24: Since the previous MRI, the patient has developed chronic encephalomalacia changes within the right medial occipital lobe compatible with an interval PCA distribution infarct. Interval development of encephalomalacia in area of previously demonstrated left MCA distribution infarct, which was acute on the previous MRI. - Urine drug screen negative  - Correct metabolic derangements  - WUA daily  - Avoid sedation medication as able    CARDIAC FAILURE- EF 20% -Lasix  as tolerated -follow up cardiac  enzymes as indicated     RENAL -continue Foley Catheter-assess need -Avoid nephrotoxic agents -Follow urine output, BMP -Ensure adequate renal perfusion, optimize oxygenation -Renal dose medications   Intake/Output Summary (Last 24 hours) at 03/08/2024 0709 Last data filed at 03/08/2024 9389 Gross per 24 hour  Intake 2140.69 ml  Output 2355 ml  Net -214.31 ml     ENDO - ICU hypoglycemic\Hyperglycemia protocol -check FSBS per protocol   GI GI PROPHYLAXIS as indicated NUTRITIONAL STATUS DIET-->TF's as tolerated Constipation protocol as indicated   ELECTROLYTES -follow labs as needed -replace as needed -pharmacy consultation and following  RESTRICTIVE TRANSFUSION PROTOCOL TRANSFUSION  IF HGB<7  or ACTIVE BLEEDING OR DX of ACUTE CORONARY SYNDROMES        Best Practice (right click and Reselect all SmartList Selections daily)   Diet/type: TF's DVT prophylaxis SCD's Pressure ulcer(s): N/A GI prophylaxis: PPI Lines: Central line (Right Brachial PICC line placed 03/03/24) Foley:  N/A Code Status:  full code Last date of multidisciplinary goals of care discussion [03/04/24]  Labs   CBC: Recent Labs  Lab 03/04/24 0400 03/05/24 0401 03/06/24 0415 03/07/24 0500 03/08/24 0343  WBC 22.1* 21.6* 19.2* 16.3* 14.5*  HGB 8.1* 8.4* 7.6* 8.0* 7.8*  HCT 25.6* 26.7* 24.2* 25.9* 25.6*  MCV 101.6* 98.9 97.6 101.6* 103.2*  PLT 418* 496* 524* 521* 519*    Basic Metabolic Panel: Recent Labs  Lab 03/02/24 0342 03/03/24 0310 03/04/24 0400 03/05/24 0401 03/06/24 0415 03/07/24 0500 03/08/24 0343  NA 142   < > 144 141 144 144 142  K 3.5   < > 3.8 4.1 3.9 4.7 4.3  CL 104   < > 110 104 107 107 107  CO2 30   < > 27 27 29  32 30  GLUCOSE 117*   < > 95 136* 117* 134* 159*  BUN 48*   < > 34* 42* 48* 49* 46*  CREATININE 1.78*   < > 1.16 1.02 1.13 1.31* 1.07  CALCIUM  7.9*   < > 7.7* 7.8* 7.6* 8.0* 7.8*  MG 2.5*   < > 2.3 2.2 2.2 2.2 2.1  PHOS 2.5  --  2.4* 3.2 2.8   --   --    < > = values in this interval not displayed.   GFR: Estimated Creatinine Clearance: 69.5 mL/min (by C-G formula  based on SCr of 1.07 mg/dL). Recent Labs  Lab 03/02/24 0342 03/03/24 0310 03/05/24 0401 03/06/24 0415 03/07/24 0500 03/08/24 0343  PROCALCITON 0.37  --   --   --   --   --   WBC 16.7*   < > 21.6* 19.2* 16.3* 14.5*   < > = values in this interval not displayed.    Liver Function Tests: Recent Labs  Lab 03/02/24 0342 03/04/24 0400  AST  --  57*  ALT  --  93*  ALKPHOS  --  56  BILITOT  --  1.9*  PROT  --  6.1*  ALBUMIN 2.2* 2.1*   No results for input(s): LIPASE, AMYLASE in the last 168 hours. No results for input(s): AMMONIA in the last 168 hours.  ABG    Component Value Date/Time   PHART 7.52 (H) 03/04/2024 0647   PCO2ART 40 03/04/2024 0647   PO2ART 74 (L) 03/04/2024 0647   HCO3 32.7 (H) 03/04/2024 0647   ACIDBASEDEF 1.1 02/20/2024 1452   O2SAT 97.9 03/04/2024 0647     Coagulation Profile: Recent Labs  Lab 03/02/24 0342 03/04/24 0039  INR 1.4* 1.4*    Cardiac Enzymes: No results for input(s): CKTOTAL, CKMB, CKMBINDEX, TROPONINI in the last 168 hours.  HbA1C: Hgb A1c MFr Bld  Date/Time Value Ref Range Status  07/14/2018 04:29 AM 5.8 (H) 4.8 - 5.6 % Final    Comment:    (NOTE) Pre diabetes:          5.7%-6.4% Diabetes:              >6.4% Glycemic control for   <7.0% adults with diabetes   07/13/2018 04:25 AM 6.2 (H) 4.8 - 5.6 % Final    Comment:    (NOTE) Pre diabetes:          5.7%-6.4% Diabetes:              >6.4% Glycemic control for   <7.0% adults with diabetes     CBG: Recent Labs  Lab 03/07/24 1134 03/07/24 1614 03/07/24 1929 03/08/24 0004 03/08/24 0339  GLUCAP 130* 141* 125* 111* 142*      DVT/GI PRX  assessed I Assessed the need for Labs I Assessed the need for Foley I Assessed the need for Central Venous Line Family Discussion when available I Assessed the need for Mobilization I  made an Assessment of medications to be adjusted accordingly Safety Risk assessment completed  CASE DISCUSSED IN MULTIDISCIPLINARY ROUNDS WITH ICU TEAM     Critical Care Time devoted to patient care services described in this note is 55 minutes.  Critical care was necessary to treat /prevent imminent and life-threatening deterioration.    Nickolas Alm Cellar, M.D.  [facility]  Medical Director [facility] Medical Director [facility] Cardio-Pulmonary Department

## 2024-03-08 NOTE — Plan of Care (Signed)
  Problem: Nutrition: Goal: Adequate nutrition will be maintained Outcome: Progressing   Problem: Coping: Goal: Level of anxiety will decrease Outcome: Progressing   Problem: Education: Goal: Knowledge of General Education information will improve Description: Including pain rating scale, medication(s)/side effects and non-pharmacologic comfort measures Outcome: Not Progressing   Problem: Health Behavior/Discharge Planning: Goal: Ability to manage health-related needs will improve Outcome: Not Progressing   Problem: Activity: Goal: Risk for activity intolerance will decrease Outcome: Not Progressing

## 2024-03-09 ENCOUNTER — Inpatient Hospital Stay

## 2024-03-09 DIAGNOSIS — I5020 Unspecified systolic (congestive) heart failure: Secondary | ICD-10-CM | POA: Diagnosis not present

## 2024-03-09 DIAGNOSIS — J690 Pneumonitis due to inhalation of food and vomit: Secondary | ICD-10-CM | POA: Diagnosis not present

## 2024-03-09 DIAGNOSIS — I469 Cardiac arrest, cause unspecified: Secondary | ICD-10-CM | POA: Diagnosis not present

## 2024-03-09 DIAGNOSIS — Z0189 Encounter for other specified special examinations: Secondary | ICD-10-CM | POA: Diagnosis not present

## 2024-03-09 LAB — BASIC METABOLIC PANEL WITH GFR
Anion gap: 5 (ref 5–15)
BUN: 43 mg/dL — ABNORMAL HIGH (ref 8–23)
CO2: 29 mmol/L (ref 22–32)
Calcium: 7.9 mg/dL — ABNORMAL LOW (ref 8.9–10.3)
Chloride: 109 mmol/L (ref 98–111)
Creatinine, Ser: 0.72 mg/dL (ref 0.61–1.24)
GFR, Estimated: >60 mL/min (ref >60)
Glucose, Bld: 131 mg/dL — ABNORMAL HIGH (ref 70–99)
Potassium: 4.6 mmol/L (ref 3.5–5.1)
Sodium: 143 mmol/L (ref 135–145)

## 2024-03-09 LAB — CBC
HCT: 26.5 % — ABNORMAL LOW (ref 39.0–52.0)
Hemoglobin: 8.2 g/dL — ABNORMAL LOW (ref 13.0–17.0)
MCH: 32.3 pg (ref 26.0–34.0)
MCHC: 30.9 g/dL (ref 30.0–36.0)
MCV: 104.3 fL — ABNORMAL HIGH (ref 80.0–100.0)
Platelets: 585 K/uL — ABNORMAL HIGH (ref 150–400)
RBC: 2.54 MIL/uL — ABNORMAL LOW (ref 4.22–5.81)
RDW: 17 % — ABNORMAL HIGH (ref 11.5–15.5)
WBC: 13.7 K/uL — ABNORMAL HIGH (ref 4.0–10.5)
nRBC: 0.7 % — ABNORMAL HIGH (ref 0.0–0.2)

## 2024-03-09 LAB — MAGNESIUM: Magnesium: 2.3 mg/dL (ref 1.7–2.4)

## 2024-03-09 LAB — GLUCOSE, CAPILLARY
Glucose-Capillary: 100 mg/dL — ABNORMAL HIGH (ref 70–99)
Glucose-Capillary: 110 mg/dL — ABNORMAL HIGH (ref 70–99)
Glucose-Capillary: 117 mg/dL — ABNORMAL HIGH (ref 70–99)
Glucose-Capillary: 118 mg/dL — ABNORMAL HIGH (ref 70–99)
Glucose-Capillary: 86 mg/dL (ref 70–99)
Glucose-Capillary: 96 mg/dL (ref 70–99)

## 2024-03-09 MED ORDER — GADOBUTROL 1 MMOL/ML IV SOLN
9.0000 mL | Freq: Once | INTRAVENOUS | Status: AC | PRN
  Administered 2024-03-09: 9 mL via INTRAVENOUS

## 2024-03-09 NOTE — Plan of Care (Signed)
  Problem: Nutrition: Goal: Adequate nutrition will be maintained Outcome: Progressing   Problem: Coping: Goal: Level of anxiety will decrease Outcome: Progressing   Problem: Elimination: Goal: Will not experience complications related to urinary retention Outcome: Progressing   Problem: Pain Managment: Goal: General experience of comfort will improve and/or be controlled Outcome: Progressing   Problem: Education: Goal: Knowledge of General Education information will improve Description: Including pain rating scale, medication(s)/side effects and non-pharmacologic comfort measures Outcome: Not Progressing   Problem: Health Behavior/Discharge Planning: Goal: Ability to manage health-related needs will improve Outcome: Not Progressing

## 2024-03-09 NOTE — Progress Notes (Signed)
 Patient has upcoming court date on 03/11/24. Hospitalization note faxed to retained [facility].

## 2024-03-09 NOTE — Plan of Care (Signed)
  Problem: Clinical Measurements: Goal: Ability to maintain clinical measurements within normal limits will improve Outcome: Progressing Goal: Will remain free from infection Outcome: Progressing Goal: Diagnostic test results will improve Outcome: Progressing Goal: Respiratory complications will improve Outcome: Progressing Goal: Cardiovascular complication will be avoided Outcome: Progressing   Problem: Activity: Goal: Risk for activity intolerance will decrease Outcome: Progressing   Problem: Nutrition: Goal: Adequate nutrition will be maintained Outcome: Progressing   Problem: Elimination: Goal: Will not experience complications related to bowel motility Outcome: Not Progressing Goal: Will not experience complications related to urinary retention Outcome: Not Progressing   Problem: Pain Managment: Goal: General experience of comfort will improve and/or be controlled Outcome: Progressing   Problem: Safety: Goal: Ability to remain free from injury will improve Outcome: Progressing   Problem: Skin Integrity: Goal: Risk for impaired skin integrity will decrease Outcome: Not Progressing   Problem: Cardiac: Goal: Ability to achieve and maintain adequate cardiopulmonary perfusion will improve Outcome: Progressing   Problem: Neurologic: Goal: Promote progressive neurologic recovery Outcome: Not Progressing   Problem: Skin Integrity: Goal: Risk for impaired skin integrity will be minimized. Outcome: Progressing

## 2024-03-09 NOTE — Progress Notes (Signed)
 Tube feeds held per Dr. Aleskerov for MRI.

## 2024-03-09 NOTE — TOC Progression Note (Addendum)
 Transition of Care [facility]) - Progression Note    Patient Details  Name: Miguel [name] MRN: [id number] Date of Birth: February 18, 1951  Transition of Care [facility]) CM/SW Contact  Lauraine JAYSON Carpen, LCSW Phone Number: 03/09/2024, 9:12 AM  Clinical Narrative:  CSW spoke to [facility] liaison who stated that patient's shara was denied because his insurance was terminated due to being incarcerated. [facility] staff is reaching out to [facility] today as it does not appear that patient will return to jail anytime soon due to his medical concerns.  4:20 pm: Per [facility] liaison, they confirmed patient has insurance but they will have to appeal patient's denial. Appeal request form is on the chart for MD to sign.  Expected Discharge Plan and Services                                               Social Determinants of Health (SDOH) Interventions SDOH Screenings   Food Insecurity: Patient Unable To Answer (02/21/2024)  Housing: Patient Unable To Answer (02/21/2024)  Transportation Needs: Patient Unable To Answer (02/21/2024)  Utilities: Patient Unable To Answer (02/21/2024)  Social Connections: Patient Unable To Answer (02/21/2024)  Tobacco Use: Low Risk  (03/04/2024)    Readmission Risk Interventions     No data to display

## 2024-03-09 NOTE — Progress Notes (Signed)
 PHARMACY CONSULT NOTE  Pharmacy Consult for Electrolyte Monitoring and Replacement   Recent Labs: Potassium (mmol/L)  Date Value  03/09/2024 4.6  09/15/2012 4.4   Magnesium  (mg/dL)  Date Value  92/85/7974 2.3   Calcium  (mg/dL)  Date Value  92/85/7974 7.9 (L)   Calcium , Total (mg/dL)  Date Value  98/79/7985 9.4   Albumin (g/dL)  Date Value  92/90/7974 2.1 (L)  09/15/2012 4.2   Phosphorus (mg/dL)  Date Value  92/88/7974 2.8   Sodium (mmol/L)  Date Value  03/09/2024 143  09/15/2012 146 (H)   Assessment: 73 y/o male with h/o CHF, dilated cardiomyopathy, substance abuse, schizophrenia, CVA, depression, HCV, homelessness and recently in jail who is admitted with PEA arrest, AKI, CHF with bilateral pleural effusions, thrombus, shock, H influenza and strep pneumoniae and sepsis. Pharmacy is asked to follow and replace electrolytes while in CCU  Goal of Therapy:  Potassium 4.0 - 5.1 mmol/L Magnesium  2.0 - 2.4 mg/dL All Other Electrolytes WNL  Plan:  --no electrolyte replacement warranted for today --Will continue to monitor peripherally  Miguel [name] 03/09/2024 7:05 AM

## 2024-03-09 NOTE — Progress Notes (Signed)
 NAME:  Miguel [name], MRN:  [id number], DOB:  July 26, 1951, LOS: 18 ADMISSION DATE:  02/20/2024, CONSULTATION DATE: 02/20/2024 REFERRING MD: Dr. Levander,  CHIEF COMPLAINT: Cardiac Arrest    History of Present Illness:  73 yo male who presented to [facility] ER via EMS from jail on 06/26 post cardiac arrest.  Per ER notes pts cellmate heard the pt gurgling this morning.  He was found pulseless with initial cardiac rhythm asystole.  EMS administered 3 doses of epi/2 mg of narcan .  Following administration of narcan  ROSC achieved.  Estimated downtime 15 minutes.  EMS reported en route to the ER pt became agitated and attempted to remove Igel.  EMS administered versed .  Pt became pulseless again en route ACLS protocol initiated.  ED Course  Upon arrival to the ER pt remained pulseless in PEA.  ACLS protocol continued pt received: 2 mg of epi/4 mg of iv narcan /1 amp of bicarb/1 g of calcium .  Igel exchanged for an ETT with ongoing CPR (pt did not require RSI medications for intubation).  ROSC achieved with estimated downtime during 2nd cardiac arrest 15 minutes.  CTA Chest negative for PE.  Significant lab results were: Na+ 129/K+ 5.3/chloride 90/glucose 110/BUN 49/creatinine 2.22/calcium  7.5/mag 3.2/alk phos 134/AST 3,301/ALT 2,038/total bilirubin 5.5/troponin 344/lactic acid 8.1/urine drug screen negative/UA concerning for possible UTI.  Sepsis protocol initiated pt to receive: cefepime /metronidazole /vancomycin .  Pt found to be severely hypoglycemic CBG 17 requiring 1 amp of D50W.  Levophed  gtt initiated due to hypotension.  PCCM team contacted for ICU admission.  Pt remains in police custody with bilateral ankle cuffs in place, and police officer at bedside.    03/09/24- patient remains off sedation x 24h.  Still with GCS5T concern for CVA post cardiac arrest. Plan for MRI brain today at 2pm.  CM working on insurance due to prisoner status.   Pertinent  Medical History  Schizophrenia  Depression Current Smoker   Chronic Systolic CHF (Echo 09/11/22: EF <20%, grade II diastolic dysfunction, trivial pericardial effusion, mild mitral valve regurgitation, trivial tricuspid valve regurgitation, trivial pulmonic valve regurgitation) CVA with aphasia  Micro Data:   MRSA PCR 06/26>>negative  COVID/Influenza A&B/RSV 06/26>>negative  RVP 06/26>>negative  Blood 06/26>>NGTD  Tracheal aspirate 06/26>>H influenza and strep pneumoniae  Urine 06/26>>less than 10,000 colonies/ml insignificant growth  Legionella pneum ur ag 06/26>>negative   Anti-infectives (From admission, onward)    Start     Dose/Rate Route Frequency Ordered Stop   03/05/24 1200  fluconazole  (DIFLUCAN ) tablet 200 mg        200 mg Per Tube Daily 03/05/24 0834 03/12/24 0959   03/04/24 2200  micafungin  (MYCAMINE ) 100 mg in sodium chloride  0.9 % 100 mL IVPB  Status:  Discontinued        100 mg 105 mL/hr over 1 Hours Intravenous Daily 03/04/24 1936 03/05/24 0834   03/03/24 0600  ceFAZolin  (ANCEF ) IVPB 2g/100 mL premix        2 g 200 mL/hr over 30 Minutes Intravenous On call to O.R. 03/02/24 1432 03/04/24 0559   02/28/24 1000  piperacillin -tazobactam (ZOSYN ) IVPB 3.375 g  Status:  Discontinued        3.375 g 12.5 mL/hr over 240 Minutes Intravenous Every 8 hours 02/28/24 0815 03/03/24 1021   02/22/24 1100  cefTRIAXone  (ROCEPHIN ) 2 g in sodium chloride  0.9 % 100 mL IVPB        2 g 200 mL/hr over 30 Minutes Intravenous Every 24 hours 02/22/24 0952 02/27/24 1134   02/22/24 1100  azithromycin  (ZITHROMAX ) 500 mg in sodium chloride  0.9 % 250 mL IVPB  Status:  Discontinued        500 mg 250 mL/hr over 60 Minutes Intravenous Every 24 hours 02/22/24 0952 02/24/24 1009   02/21/24 0000  ceFEPIme  (MAXIPIME ) 2 g in sodium chloride  0.9 % 100 mL IVPB  Status:  Discontinued        2 g 200 mL/hr over 30 Minutes Intravenous Every 12 hours 02/20/24 1051 02/22/24 0952   02/20/24 1200  vancomycin  (VANCOREADY) IVPB 2000 mg/400 mL        2,000 mg 200 mL/hr over  120 Minutes Intravenous  Once 02/20/24 1007 02/20/24 1400   02/20/24 1050  vancomycin  variable dose per unstable renal function (pharmacist dosing)  Status:  Discontinued         Does not apply See admin instructions 02/20/24 1051 02/21/24 0925   02/20/24 0845  ceFEPIme  (MAXIPIME ) 2 g in sodium chloride  0.9 % 100 mL IVPB        2 g 200 mL/hr over 30 Minutes Intravenous  Once 02/20/24 0838 02/20/24 1410   02/20/24 0845  metroNIDAZOLE  (FLAGYL ) IVPB 500 mg        500 mg 100 mL/hr over 60 Minutes Intravenous  Once 02/20/24 0838 02/20/24 1559   02/20/24 0845  vancomycin  (VANCOCIN ) IVPB 1000 mg/200 mL premix  Status:  Discontinued        1,000 mg 200 mL/hr over 60 Minutes Intravenous  Once 02/20/24 0838 02/20/24 1007      Significant Hospital Events: Including procedures, antibiotic start and stop dates in addition to other pertinent events   06/26: Pt admitted post cardiac arrest mechanically intubated with possible cardiogenic/possible septic shock with multiorgan failure requiring levophed  and dobutamine  gtts 06/27: on minimal sedation, discontinued this AM. Off nor-epi, continued on dobutamine  06/28: decreased dobutamine  to 2.5, remains off sedation 06/29: arouses to tactile and verbal stimuli, remains disoriented. Failed SBT this AM. Dobutamine  increased to 5 mcg overnight due to bradycardia 06/30: Pt remains mechanically intubated, however neuro exam precludes extubation.  He has not received sedation since 06/27. Dobutamine  discontinued  06/30: MRI Brain Since the previous MRI, the patient has developed chronic           encephalomalacia changes within the right medial occipital lobe compatible with an        interval  PCA distribution infarct. Interval development of encephalomalacia in area of        previously demonstrated left MCA distribution infarct, which was acute on the        previous MRI. 07/01: Overnight pt remained in atrial flutter with rvr hr 140 to 150's with possible ST  elevation despite iv metoprolol .  Cardiologist Dr Darron notified recommended amiodarone  bolus followed by amiodarone  gtt for rate control.  However, this morning while on amiodarone  gtt pt became severely bradycardic hr as low as 27 bpm with associated hypotension, amiodarone  gtt discontinued he is now requiring low dose levophed  gtt. Self extubated respiratory status stable but remained delirious requiring precedex  gtt  07/02: No significant events noted overnight.  Remains on Precedex  due to delirium, currently protecting his airway.  Afebrile, hemodynamically stable, Levophed  weaned off, Bradycardia resolved, no reports of tachyarrhythmias.  Dobutamine  weaned to 2.5 and Coox remains stable at 83, will d/c Dobutamine .  Speech/PT/OT consulted.  Failed speech evaluation, plan to place Dobhoff as able, remove foley. 07/03: No significant events noted overnight. Remains on low dose precedex  (0.4 mcg) due to delirium.  Afebrile, hemodynamically  stable, no vasopressors.  Coox 73 and Lactic 1.1 OFF of inotropes and OFF Levophed .  Dobhoff with difficultly functioning, will exchange it to a regular NGT for feeds and med administration.  Gentle diuresis with 20 mg IV Lasix  x1 dose.  Hgb decreased to 9 from 11.7, no signs of bleeding, will d/c Heparin  gtt given no thrombus on repeat Echo. 07/04: Overnight Levophed  reinitiated, Hgb dropped to 6.4 (no reports of bleeding from nursing, abdominal exam is benign), ordered for 1 unit pRBC's, Lovenox  d/c,  increase PPI to BID dosing.  With worsening Leukocytosis up to 15.5 from 12.8 and increased FiO2 requirements to up 10 L (overnight deep Nasotracheal suctioning yielded copious amounts of thick secretions) , will check PCT and start empiric Zosyn  for aspiration coverage.  WOB is improved from yesterday, on low dose Precedex  but more interactive today.  Again unable to complete speech evaluation, will attempt NG tube placement again. 07/05: Remains confused and delirious. HR  in the 140s; unresponsive to amiodarone  bolus and infusion. Transfused 1 unit of PRBCs for Hg of 7.1. Started on valium  for agitation. 07/06: Intubated overnight due to worsening mental status and thick copious secretions 07/09: Pt remains mechanically intubated pending trach placement  7/9 s/p TRACH family updated 7/10-7/11 off sedation, remains obtunded 7/12 failed vent weaning trial  Interim History / Subjective:   VENT WEANING ATTEMPT  7/10 PATIENT UNABLE TO WEAN FROM VENT 7/13 patient with increased WOB with weaning trial     Remains critically ill Remains s/p trach Requires VENT support for survival  Vent Mode: PSV FiO2 (%):  [28 %] 28 % PEEP:  [5 cmH20] 5 cmH20 Pressure Support:  [8 cmH20] 8 cmH20   Objective    Blood pressure (!) 122/57, pulse 65, temperature 98.6 F (37 C), resp. rate 13, height 6' (1.829 m), weight 91.3 kg, SpO2 100%.    Vent Mode: PSV FiO2 (%):  [28 %] 28 % PEEP:  [5 cmH20] 5 cmH20 Pressure Support:  [8 cmH20] 8 cmH20   Intake/Output Summary (Last 24 hours) at 03/09/2024 0758 Last data filed at 03/09/2024 0730 Gross per 24 hour  Intake 2271.13 ml  Output 1655 ml  Net 616.13 ml   Filed Weights   03/07/24 0500 03/08/24 0400 03/09/24 0400  Weight: 91 kg 91 kg 91.3 kg      REVIEW OF SYSTEMS  PATIENT IS UNABLE TO PROVIDE COMPLETE REVIEW OF SYSTEMS DUE TO SEVERE CRITICAL ILLNESS   PHYSICAL EXAMINATION:  GENERAL:critically ill appearing, +resp distress EYES: Pupils equal, round, reactive to light.  No scleral icterus.  MOUTH: Moist mucosal membrane. S/p trach NECK: Supple.  PULMONARY: Lungs clear to auscultation, +rhonchi, +wheezing CARDIOVASCULAR: S1 and S2.  Regular rate and rhythm GASTROINTESTINAL: Soft, nontender, -distended. Positive bowel sounds.  MUSCULOSKELETAL: No swelling, clubbing, or edema.  NEUROLOGIC: sedated SKIN:normal, warm to touch, Capillary refill delayed  Pulses present bilaterally   Resolved problem list   Hyponatremia  Lactic acidosis  Hyperkalemia   Assessment and Plan   73 yo AAM admitted for Cardiac arrest (PEA) Cardiogenic/possible septic shock H INFL and STREP pneumonia with Acute on chronic systolic CHF Elevated troponin suspect secondary to demand ischemia,Possible mural thrombus,Atrial flutter with rvr ,Hx: CVA with aphasia, failure to wean from vent and s/p TRACH, patient with end stage schizophrenia   Severe ACUTE Hypoxic and Hypercapnic Respiratory Failure -continue Mechanical Ventilator support -Wean Fio2 and PEEP as tolerated -VAP/VENT bundle implementation - Wean PEEP & FiO2 as tolerated, maintain SpO2 > 88% -  Head of bed elevated 30 degrees, VAP protocol in place - Plateau pressures less than 30 cm H20  - Intermittent chest x-ray & ABG PRN - Ensure adequate pulmonary hygiene  CTA Chest PE 02/20/24: No evidence of acute pulmonary embolism. Rounded filling defect within the right atrial appendage, suspicious for thrombus. Echo 02/20/24: EF 20 to 25%   VENT WEANING ATTEMPT  7/10 PATIENT UNABLE TO WEAN FROM VENT 7/13 patient with increased WOB with weaning trial   NEUROLOGY ACUTE METABOLIC ENCEPHALOPATHY Acute metabolic encephalopathy: concerning for possible anoxic brain injury  Hx: Polysubstance abuse, depression, and schizoaffective disorder EEG 02/21/24: Study is suggestive of severe to profound diffuse encephalopathy. No seizures or epileptiform discharges were seen throughout the recording. MRI Brain 02/24/24: Since the previous MRI, the patient has developed chronic encephalomalacia changes within the right medial occipital lobe compatible with an interval PCA distribution infarct. Interval development of encephalomalacia in area of previously demonstrated left MCA distribution infarct, which was acute on the previous MRI. - Urine drug screen negative  - Correct metabolic derangements  - WUA daily  - Avoid sedation medication as able    CARDIAC FAILURE- EF  20% -Lasix  as tolerated -follow up cardiac enzymes as indicated     RENAL -continue Foley Catheter-assess need -Avoid nephrotoxic agents -Follow urine output, BMP -Ensure adequate renal perfusion, optimize oxygenation -Renal dose medications   Intake/Output Summary (Last 24 hours) at 03/09/2024 0758 Last data filed at 03/09/2024 0730 Gross per 24 hour  Intake 2271.13 ml  Output 1655 ml  Net 616.13 ml     ENDO - ICU hypoglycemic\Hyperglycemia protocol -check FSBS per protocol   GI GI PROPHYLAXIS as indicated NUTRITIONAL STATUS DIET-->TF's as tolerated Constipation protocol as indicated   ELECTROLYTES -follow labs as needed -replace as needed -pharmacy consultation and following  RESTRICTIVE TRANSFUSION PROTOCOL TRANSFUSION  IF HGB<7  or ACTIVE BLEEDING OR DX of ACUTE CORONARY SYNDROMES        Best Practice (right click and Reselect all SmartList Selections daily)   Diet/type: TF's DVT prophylaxis SCD's Pressure ulcer(s): N/A GI prophylaxis: PPI Lines: Central line (Right Brachial PICC line placed 03/03/24) Foley:  N/A Code Status:  full code Last date of multidisciplinary goals of care discussion [03/04/24]  Labs   CBC: Recent Labs  Lab 03/05/24 0401 03/06/24 0415 03/07/24 0500 03/08/24 0343 03/09/24 0350  WBC 21.6* 19.2* 16.3* 14.5* 13.7*  HGB 8.4* 7.6* 8.0* 7.8* 8.2*  HCT 26.7* 24.2* 25.9* 25.6* 26.5*  MCV 98.9 97.6 101.6* 103.2* 104.3*  PLT 496* 524* 521* 519* 585*    Basic Metabolic Panel: Recent Labs  Lab 03/04/24 0400 03/05/24 0401 03/06/24 0415 03/07/24 0500 03/08/24 0343 03/09/24 0350  NA 144 141 144 144 142 143  K 3.8 4.1 3.9 4.7 4.3 4.6  CL 110 104 107 107 107 109  CO2 27 27 29  32 30 29  GLUCOSE 95 136* 117* 134* 159* 131*  BUN 34* 42* 48* 49* 46* 43*  CREATININE 1.16 1.02 1.13 1.31* 1.07 0.72  CALCIUM  7.7* 7.8* 7.6* 8.0* 7.8* 7.9*  MG 2.3 2.2 2.2 2.2 2.1 2.3  PHOS 2.4* 3.2 2.8  --   --   --    GFR: Estimated  Creatinine Clearance: 93 mL/min (by C-G formula based on SCr of 0.72 mg/dL). Recent Labs  Lab 03/06/24 0415 03/07/24 0500 03/08/24 0343 03/09/24 0350  WBC 19.2* 16.3* 14.5* 13.7*    Liver Function Tests: Recent Labs  Lab 03/04/24 0400  AST 57*  ALT 93*  ALKPHOS 56  BILITOT 1.9*  PROT 6.1*  ALBUMIN 2.1*   No results for input(s): LIPASE, AMYLASE in the last 168 hours. No results for input(s): AMMONIA in the last 168 hours.  ABG    Component Value Date/Time   PHART 7.52 (H) 03/04/2024 0647   PCO2ART 40 03/04/2024 0647   PO2ART 74 (L) 03/04/2024 0647   HCO3 32.7 (H) 03/04/2024 0647   ACIDBASEDEF 1.1 02/20/2024 1452   O2SAT 97.9 03/04/2024 0647     Coagulation Profile: Recent Labs  Lab 03/04/24 0039  INR 1.4*    Cardiac Enzymes: No results for input(s): CKTOTAL, CKMB, CKMBINDEX, TROPONINI in the last 168 hours.  HbA1C: Hgb A1c MFr Bld  Date/Time Value Ref Range Status  07/14/2018 04:29 AM 5.8 (H) 4.8 - 5.6 % Final    Comment:    (NOTE) Pre diabetes:          5.7%-6.4% Diabetes:              >6.4% Glycemic control for   <7.0% adults with diabetes   07/13/2018 04:25 AM 6.2 (H) 4.8 - 5.6 % Final    Comment:    (NOTE) Pre diabetes:          5.7%-6.4% Diabetes:              >6.4% Glycemic control for   <7.0% adults with diabetes     CBG: Recent Labs  Lab 03/08/24 1602 03/08/24 1934 03/09/24 0007 03/09/24 0344 03/09/24 0745  GLUCAP 139* 126* 110* 100* 117*      DVT/GI PRX  assessed I Assessed the need for Labs I Assessed the need for Foley I Assessed the need for Central Venous Line Family Discussion when available I Assessed the need for Mobilization I made an Assessment of medications to be adjusted accordingly Safety Risk assessment completed  CASE DISCUSSED IN MULTIDISCIPLINARY ROUNDS WITH ICU TEAM   Critical care provider statement:   Total critical care time: 33 minutes   Performed by: Parris MD   Critical  care time was exclusive of separately billable procedures and treating other patients.   Critical care was necessary to treat or prevent imminent or life-threatening deterioration.   Critical care was time spent personally by me on the following activities: development of treatment plan with patient and/or surrogate as well as nursing, discussions with consultants, evaluation of patient's response to treatment, examination of patient, obtaining history from patient or surrogate, ordering and performing treatments and interventions, ordering and review of laboratory studies, ordering and review of radiographic studies, pulse oximetry and re-evaluation of patient's condition.    Sebastien Jackson, M.D.  Pulmonary & Critical Care Medicine

## 2024-03-10 DIAGNOSIS — I469 Cardiac arrest, cause unspecified: Secondary | ICD-10-CM | POA: Diagnosis not present

## 2024-03-10 DIAGNOSIS — Z430 Encounter for attention to tracheostomy: Secondary | ICD-10-CM | POA: Diagnosis not present

## 2024-03-10 DIAGNOSIS — Z7189 Other specified counseling: Secondary | ICD-10-CM | POA: Diagnosis not present

## 2024-03-10 LAB — BASIC METABOLIC PANEL WITH GFR
Anion gap: 5 (ref 5–15)
BUN: 43 mg/dL — ABNORMAL HIGH (ref 8–23)
CO2: 32 mmol/L (ref 22–32)
Calcium: 7.8 mg/dL — ABNORMAL LOW (ref 8.9–10.3)
Chloride: 107 mmol/L (ref 98–111)
Creatinine, Ser: 0.96 mg/dL (ref 0.61–1.24)
GFR, Estimated: >60 mL/min (ref >60)
Glucose, Bld: 105 mg/dL — ABNORMAL HIGH (ref 70–99)
Potassium: 4.9 mmol/L (ref 3.5–5.1)
Sodium: 144 mmol/L (ref 135–145)

## 2024-03-10 LAB — CBC
HCT: 27.1 % — ABNORMAL LOW (ref 39.0–52.0)
Hemoglobin: 8.3 g/dL — ABNORMAL LOW (ref 13.0–17.0)
MCH: 31.7 pg (ref 26.0–34.0)
MCHC: 30.6 g/dL (ref 30.0–36.0)
MCV: 103.4 fL — ABNORMAL HIGH (ref 80.0–100.0)
Platelets: 598 K/uL — ABNORMAL HIGH (ref 150–400)
RBC: 2.62 MIL/uL — ABNORMAL LOW (ref 4.22–5.81)
RDW: 16.8 % — ABNORMAL HIGH (ref 11.5–15.5)
WBC: 11.9 K/uL — ABNORMAL HIGH (ref 4.0–10.5)
nRBC: 1.4 % — ABNORMAL HIGH (ref 0.0–0.2)

## 2024-03-10 LAB — GLUCOSE, CAPILLARY
Glucose-Capillary: 100 mg/dL — ABNORMAL HIGH (ref 70–99)
Glucose-Capillary: 100 mg/dL — ABNORMAL HIGH (ref 70–99)
Glucose-Capillary: 107 mg/dL — ABNORMAL HIGH (ref 70–99)
Glucose-Capillary: 110 mg/dL — ABNORMAL HIGH (ref 70–99)
Glucose-Capillary: 114 mg/dL — ABNORMAL HIGH (ref 70–99)
Glucose-Capillary: 131 mg/dL — ABNORMAL HIGH (ref 70–99)
Glucose-Capillary: 135 mg/dL — ABNORMAL HIGH (ref 70–99)

## 2024-03-10 LAB — MAGNESIUM: Magnesium: 2.2 mg/dL (ref 1.7–2.4)

## 2024-03-10 LAB — DIGOXIN LEVEL: Digoxin Level: 0.8 ng/mL (ref 0.8–2.0)

## 2024-03-10 MED ORDER — MIDAZOLAM HCL 2 MG/2ML IJ SOLN
1.0000 mg | INTRAMUSCULAR | Status: DC | PRN
  Administered 2024-03-10: 1 mg via INTRAVENOUS
  Filled 2024-03-10: qty 2

## 2024-03-10 MED ORDER — JUVEN PO PACK
1.0000 | PACK | Freq: Two times a day (BID) | ORAL | Status: DC
  Administered 2024-03-10 – 2024-03-12 (×5): 1

## 2024-03-10 NOTE — Progress Notes (Signed)
 Inpatient Consult Progress Note  Attending:   Massie RAMAN. [name], MD, MBA, FARS   Otolaryngology-Head & Neck Surgery  This patient was seen today in at the request of No referring provider defined for this encounter. in regard to  Chief Complaint  Patient presents with   Cardiac Arrest     CHIEF COMPLAINT:   Chief Complaint  Patient presents with   Cardiac Arrest     HPI:  The patient is a 73 y.o. old child who presents today with complaint of  Chief Complaint  Patient presents with   Cardiac Arrest    Miguel [name] has been stable on ventilatory support with 6-cuffed tracheostomy tube since tracheotomy last Wednesday.  Plan first trach change today with examination of the fistula track and suture removal.     REVIEW OF SYSTEMS: The patient / family denies any recent history of fever, night sweats or weight loss, pain, cyanosis, clubbing or edema, respiratory distress, dizziness or imbalance.   PAST MEDICAL HISTORY: Past Medical History:  Diagnosis Date   Biventricular failure (HCC)    CVA (cerebral vascular accident) (HCC)    Polysubstance use disorder    Schizophrenia (HCC)    Ventilatory dependence  Tracheotomy on July 9th - #6 cuffed Shiley type tracheostomy tube placed.   SURGICAL HISTORY: Past Surgical History:  Procedure Laterality Date   APPENDECTOMY     TRACHEOSTOMY TUBE PLACEMENT N/A 03/04/2024   Procedure: CREATION, TRACHEOSTOMY;  Surgeon: [name] Massie RAMAN, MD;  Location: [facility] ORS;  Service: ENT;  Laterality: N/A;     MEDICATIONS:  Current Facility-Administered Medications:    amiodarone  (PACERONE ) tablet 400 mg, 400 mg, Per Tube, Daily, Nelwyn Bristle, NP, 400 mg at 03/10/24 1136   ARIPiprazole  (ABILIFY ) tablet 30 mg, 30 mg, Per Tube, QHS, Kasa, Kurian, MD, 30 mg at 03/09/24 2130   bisacodyl  (DULCOLAX) suppository 10 mg, 10 mg, Rectal, Daily PRN, Benjamin, Alzora L, NP   Chlorhexidine  Gluconate Cloth 2 % PADS 6 each, 6 each, Topical, Daily, Assaker,  Jean-Pierre, MD, 6 each at 03/09/24 2130   digoxin  (LANOXIN ) tablet 0.125 mg, 0.125 mg, Per NG tube, Daily, Assaker, Jean-Pierre, MD, 0.125 mg at 03/09/24 1004   docusate (COLACE) 50 MG/5ML liquid 100 mg, 100 mg, Per Tube, BID, Nelson, Dana G, NP, 100 mg at 03/04/24 1502   feeding supplement (OSMOLITE 1.5 CAL) liquid 1,000 mL, 1,000 mL, Per Tube, Continuous, Kasa, Kurian, MD, Last Rate: 60 mL/hr at 03/10/24 1134, 1,000 mL at 03/10/24 1134   feeding supplement (PROSource TF20) liquid 60 mL, 60 mL, Per Tube, BID, Kasa, Kurian, MD, 60 mL at 03/10/24 1100   fentaNYL  (SUBLIMAZE ) injection 50 mcg, 50 mcg, Intravenous, Q2H PRN, Ouma, Elizabeth Achieng, NP, 50 mcg at 03/04/24 0610   fluconazole  (DIFLUCAN ) tablet 200 mg, 200 mg, Per Tube, Daily, Nelson, Dana G, NP, 200 mg at 03/10/24 1135   free water  100 mL, 100 mL, Per Tube, Q4H, Kasa, Kurian, MD, 100 mL at 03/10/24 1156   ipratropium-albuterol  (DUONEB) 0.5-2.5 (3) MG/3ML nebulizer solution 3 mL, 3 mL, Nebulization, Q6H PRN, Kasa, Kurian, MD   midazolam  (VERSED ) injection 1 mg, 1 mg, Intravenous, Q2H PRN, Ouma, Elizabeth Achieng, NP, 1 mg at 03/10/24 0048   multivitamin with minerals tablet 1 tablet, 1 tablet, Per Tube, Daily, Dgayli, Khabib, MD, 1 tablet at 03/10/24 1137   Oral care mouth rinse, 15 mL, Mouth Rinse, Q2H, Ouma, Almarie Bake, NP, 15 mL at 03/10/24 1156   Oral care mouth rinse, 15 mL, Mouth  Rinse, PRN, Ouma, Elizabeth Achieng, NP   oxyCODONE  (Oxy IR/ROXICODONE ) immediate release tablet 5 mg, 5 mg, Per Tube, Q6H, Kasa, Kurian, MD, 5 mg at 03/10/24 1147   pantoprazole  (PROTONIX ) injection 40 mg, 40 mg, Intravenous, Q12H, Keene, Jeremiah D, NP, 40 mg at 03/10/24 1137   polyethylene glycol (MIRALAX  / GLYCOLAX ) packet 17 g, 17 g, Per Tube, Daily PRN, Nelson, Dana G, NP   sodium chloride  flush (NS) 0.9 % injection 10-40 mL, 10-40 mL, Intracatheter, Q12H, Dgayli, Khabib, MD, 10 mL at 03/10/24 0100   sodium chloride  flush (NS) 0.9 % injection  10-40 mL, 10-40 mL, Intracatheter, PRN, Isadora, Khabib, MD, 10 mL at 03/06/24 2123   sodium chloride  flush (NS) 0.9 % injection 10-40 mL, 10-40 mL, Intracatheter, Q12H, Kasa, Kurian, MD, 10 mL at 03/10/24 1000   sodium chloride  flush (NS) 0.9 % injection 10-40 mL, 10-40 mL, Intracatheter, PRN, Kasa, Kurian, MD   thiamine  (VITAMIN B1) tablet 100 mg, 100 mg, Per Tube, Daily, Clair Marolyn NOVAK, RPH, 100 mg at 03/10/24 1138   ALLERGIES: No Known Allergies   BIRTH HX:  Term - no complications.   SOCIAL HISTORY: Social History   Socioeconomic History   Marital status: Divorced    Spouse name: Not on file   Number of children: Not on file   Years of education: Not on file   Highest education level: Not on file  Occupational History   Not on file  Tobacco Use   Smoking status: Never   Smokeless tobacco: Never  Vaping Use   Vaping status: Never Used  Substance and Sexual Activity   Alcohol use: No   Drug use: No   Sexual activity: Not on file  Other Topics Concern   Not on file  Social History Narrative   Not on file   Social Drivers of Health   Financial Resource Strain: Not on file  Food Insecurity: Patient Unable To Answer (02/21/2024)   Hunger Vital Sign    Worried About Running Out of Food in the Last Year: Patient unable to answer    Ran Out of Food in the Last Year: Patient unable to answer  Transportation Needs: Patient Unable To Answer (02/21/2024)   PRAPARE - Transportation    Lack of Transportation (Medical): Patient unable to answer    Lack of Transportation (Non-Medical): Patient unable to answer  Physical Activity: Not on file  Stress: Not on file  Social Connections: Patient Unable To Answer (02/21/2024)   Social Connection and Isolation Panel    Frequency of Communication with Friends and Family: Patient unable to answer    Frequency of Social Gatherings with Friends and Family: Patient unable to answer    Attends Religious Services: Patient unable to answer     Active Member of Clubs or Organizations: Patient unable to answer    Attends Banker Meetings: Patient unable to answer    Marital Status: Patient unable to answer  Intimate Partner Violence: Patient Unable To Answer (02/21/2024)   Humiliation, Afraid, Rape, and Kick questionnaire    Fear of Current or Ex-Partner: Patient unable to answer    Emotionally Abused: Patient unable to answer    Physically Abused: Patient unable to answer    Sexually Abused: Patient unable to answer     FAMILY HISTORY: Family History  Family history unknown: Yes     PHYSICAL EXAM: BP (!) 120/54   Pulse (!) 53   Temp 98.2 F (36.8 C) (Axillary)   Resp 18  Ht 6' (1.829 m)   Wt 91.7 kg   SpO2 99%   BMI 27.42 kg/m    Constitutional - please see medical record for recorded vital signs. Sedated, on ventilatory support - trach secure.    Head and Face - inspection of the head and face revealed the scalp to be normal and skin without scars, lesions or masses.  There was no evidence of peri-orbital edema or erythema, or palpable sinus tenderness.  There was no evidence of salivary gland tenderness or enlargement.  Facial strength appeared intact and symmetric bilaterally.  Eyes - pupils were equal, round and reactive to light.  Extra-ocular movements were intact and vision was grossly normal bilaterally.  Ears - The external ears were normal in appearance.  Otoscopic exam revealed the external auditory canals to be patent.  The tympanic membranes were clear bilaterally, with normal mobility on pneumatic otoscopy.  There was no evidence of middle ear fluid, perforation, drainage or acute infection.  Hearing was grossly intact and speech reception thresholds were grossly within normal limits.  Nose - The external nose was normal in appearance.  The nasal dorsum was midline.  Exam of the anterior nasal cavity revealed no evidence of purulent drainage, polyps or mass or mucosal lesions.  The septum  was midline and the inferior turbinate were normal in size and appearance.    Oral cavity - The mucosa of the oral cavity and oropharynx was normal without mass or mucosal lesion, erythema or exudate.  The posterior pharyngeal wall, soft and hard palate and tongue all appeared normal.  There was no evidence of significant tonsillar hypertrophy.  Neck - The neck was nontender and without palpable adenopathy, crepitus or mass lesion.  The trachea was midline.  The thyroid exam revealed no evidence of enlargement, tenderness or mass lesion.  Tracheotomy fistula track healed - no evidence of skin breakdown or granulation tissue.  Repiratory - The patient was without respiratory distress, stridor or retractions.  Breath sounds were clear bilaterally.  Cardiovascular - Cardiovascular exam revealed a regular rate and rhythm with no evidence of murmur.  Extremeties without evidence of cyanosis, clubbing or edema.  Lymphatic System - there was no evidence of palpable adenopathy in the neck, supraclavicular fossae or axillae.  Neurologic - Cranial nerves II through XII were grossly intact bilaterally.  In particular the VII cranial nerve was intact and symmetric bilaterally.    PROCEDURE NOTE  Procedure: Tracheostomy tube change - first postop with examination of the new tracheotomy fistula track (CPT [phone])  Indication: Tracheostomy tube dependence s/p tracheotomy on 03/04/24.    Consent: After discussion of the risks of the procedure (primarily pain and bleeding) and obtaining informed verbal consent, a time-out was performed confirming the patient's name, birthdate, and procedure to be performed.  Surgeon: Massie RAMAN. [name], MD, MBA, FARS  Anesthesia:  None; sedated on ventilatory support  Complications: None   Procedure:  Sutures were removed along with the dressing.  The tracheotomy fistula track was well healed - no evidence of skin breakdown or granulation tissue and patent.  A new tracheostomy  tube (cuffed #6) was placed without difficulty and secured with a new Dale trach tube tie.    Findings:   The trach tube is a good size for the airway. No airway collapse or tracheo / bronchomalacia. No bleeding or purulent secretions. No skin breakdown Tracheotomy fistula track well healed  IMAGING:   AUDIOLOGY:   Medical Decision Making  ASSESSMENT:  Doing well and  stable with tracheostomy tube secure s/p tracheotomy on July 9.  Tracheotomy fistula track healed - no evidence of skin breakdown or granulation tissue.  First trach change performed today with the assistance of RT - greatly appreciate their help.     PLAN:  First trach change performed today with the assistance of RT - greatly appreciate their help.  Dressing and sutures removed.  New, #6 cuffed tracheostomy tube placed.  Recommend routine trach care going forward.  Will continue to follow - available for any questions or concerns.    Massie RAMAN. [name], MD, MBA, [facility] Otolaryngology-Head & Neck Surgery [facility] [phone]

## 2024-03-10 NOTE — Progress Notes (Signed)
 Nutrition Follow-up  DOCUMENTATION CODES:   Severe malnutrition in context of social or environmental circumstances  INTERVENTION:   Continue Osmolite 1.5@60ml /hr + ProSource TF 20- Give 60ml BID via tube  Free water  flushes 100ml q4 hours   Regimen provides 2320kcal/day, 130g/day protein and 1658ml/day of fluid.   MVI daily via tube  Thiamine  100mg  daily via tube  Add Juven Fruit Punch BID via tube, each serving provides 95kcal and 2.5g of protein (amino acids glutamine and arginine)  Daily weights   NUTRITION DIAGNOSIS:   Severe Malnutrition related to social / environmental circumstances as evidenced by moderate fat depletion, severe fat depletion, moderate muscle depletion, severe muscle depletion. -ongoing   GOAL:   Provide needs based on ASPEN/SCCM guidelines -met   MONITOR:   Vent status, Labs, Weight trends, TF tolerance, I & O's, Skin  ASSESSMENT:   73 y/o male with h/o CHF, dilated cardiomyopathy, substance abuse, schizophrenia, CVA, depression, HCV, homelessness and recently in jail who is admitted with PEA arrest, AKI, CHF with bilateral pleural effusions, thrombus, shock, H influenza and strep pneumoniae and sepsis.  -Pt s/p tracheostomy 7/9 -Pt s/p NGT placement 7/4 (will need exchanging by 8/4)  Pt remains ventilated via trach. Pt tolerating tube feeds well at goal rate via NGT. Refeed labs stable. Pt with ongoing diarrhea via rectal tube but amount seems to be decreasing. Pt with new wound documented; RD will add Juven. Per chart, pt is down ~14lbs since admission but remains up ~17lbs from his UBW. Pt remains weight stable for the past 4 days. Plan is for [facility] at discharge.   Medications reviewed and include: colace, diflucan , MVI, oxycodone , thiamine , protonix   Labs reviewed: Na 144 wnl, K 4.9 wnl, BUN 43(H), Mg 2.2 wnl Folate > 40 wnl, B12 7412(H)- 6/26 Wbc- 11.9(H), Hgb 8.3(L), Hct 27.1(L) Cbgs- 100, 114, 100 x 24 hrs   Patient is currently  intubated on ventilator support MV: 8.6 L/min Temp (24hrs), Avg:98.4 F (36.9 C), Min:98 F (36.7 C), Max:98.8 F (37.1 C)  MAP >52mmHg   UOP-   Diet Order:   Diet Order             Diet NPO time specified Except for: Other (See Comments)  Diet effective midnight                  EDUCATION NEEDS:   Not appropriate for education at this time  Skin:  Skin Assessment: Reviewed RN Assessment  Last BM:  7/15- via rectal tube  Height:   Ht Readings from Last 1 Encounters:  03/02/24 6' (1.829 m)    Weight:   Wt Readings from Last 1 Encounters:  03/10/24 91.7 kg    Ideal Body Weight:  80.9 kg  BMI:  Body mass index is 27.42 kg/m.  Estimated Nutritional Needs:   Kcal:  2300-2600kcal/day  Protein:  115-130g/day  Fluid:  1.9-2.1 L  Augustin Shams MS, RD, LDN If unable to be reached, please send secure chat to RD inpatient available from 8:00a-4:00p daily

## 2024-03-10 NOTE — Progress Notes (Signed)
 [id number] Patient not interactive with nurse.

## 2024-03-10 NOTE — Progress Notes (Signed)
 PHARMACY CONSULT NOTE  Pharmacy Consult for Electrolyte Monitoring and Replacement   Recent Labs: Potassium (mmol/L)  Date Value  03/10/2024 4.9  09/15/2012 4.4   Magnesium  (mg/dL)  Date Value  92/84/7974 2.2   Calcium  (mg/dL)  Date Value  92/84/7974 7.8 (L)   Calcium , Total (mg/dL)  Date Value  98/79/7985 9.4   Albumin (g/dL)  Date Value  92/90/7974 2.1 (L)  09/15/2012 4.2   Phosphorus (mg/dL)  Date Value  92/88/7974 2.8   Sodium (mmol/L)  Date Value  03/10/2024 144  09/15/2012 146 (H)   Assessment: 73 y/o male with h/o CHF, dilated cardiomyopathy, substance abuse, schizophrenia, CVA, depression, HCV, homelessness and recently in jail who is admitted with PEA arrest, AKI, CHF with bilateral pleural effusions, thrombus, shock, H influenza and strep pneumoniae and sepsis. Pharmacy is asked to follow and replace electrolytes while in CCU  Goal of Therapy:  Potassium 4.0 - 5.1 mmol/L Magnesium  2.0 - 2.4 mg/dL All Other Electrolytes WNL  Plan:  --no electrolyte replacement warranted for today --Will continue to monitor peripherally  Miguel [name] 03/10/2024 7:23 AM

## 2024-03-10 NOTE — TOC Progression Note (Signed)
 Transition of Care [facility]) - Progression Note    Patient Details  Name: Miguel [name] MRN: [id number] Date of Birth: 12-Feb-1951  Transition of Care [facility]) CM/SW Contact  Lauraine JAYSON Carpen, LCSW Phone Number: 03/10/2024, 11:09 AM  Clinical Narrative:  MD signed appeal form. CSW sent back to [facility] liaison in a secure email.   Expected Discharge Plan and Services                                               Social Determinants of Health (SDOH) Interventions SDOH Screenings   Food Insecurity: Patient Unable To Answer (02/21/2024)  Housing: Patient Unable To Answer (02/21/2024)  Transportation Needs: Patient Unable To Answer (02/21/2024)  Utilities: Patient Unable To Answer (02/21/2024)  Social Connections: Patient Unable To Answer (02/21/2024)  Tobacco Use: Low Risk  (03/04/2024)    Readmission Risk Interventions     No data to display

## 2024-03-10 NOTE — Progress Notes (Signed)
 Daily Progress Note   Patient Name: Miguel [name]       Date: 03/10/2024 DOB: March 26, 1951  Age: 73 y.o. MRN#: [id number] Attending Physician: Parris Manna, MD Primary Care Physician: [facility] Admit Date: 02/20/2024  Reason for Consultation/Follow-up: Establishing goals of care  Subjective: Notes and labs reviewed including MRI completed yesterday, which was negative fro acute intracranial abnormality. Spoke with RN Lang who faxed a letter to attorney about upcoming court date.   In to see patient. He is resting in bed with trach in place, on vent support. NGT in place. No sedation noted. He appeared to squint eyes briefly upon touching his chest, but did not open his eyes norm attempt to communicate with me.  No family at bedside.   Staff currently working on [employer] placement. PMT will shadow for needs.   Length of Stay: 19  Current Medications: Scheduled Meds:   amiodarone   400 mg Per Tube Daily   ARIPiprazole   30 mg Per Tube QHS   Chlorhexidine  Gluconate Cloth  6 each Topical Daily   digoxin   0.125 mg Per NG tube Daily   docusate  100 mg Per Tube BID   feeding supplement (PROSource TF20)  60 mL Per Tube BID   fluconazole   200 mg Per Tube Daily   free water   100 mL Per Tube Q4H   multivitamin with minerals  1 tablet Per Tube Daily   mouth rinse  15 mL Mouth Rinse Q2H   oxyCODONE   5 mg Per Tube Q6H   pantoprazole  (PROTONIX ) IV  40 mg Intravenous Q12H   sodium chloride  flush  10-40 mL Intracatheter Q12H   sodium chloride  flush  10-40 mL Intracatheter Q12H   thiamine   100 mg Per Tube Daily    Continuous Infusions:  feeding supplement (OSMOLITE 1.5 CAL) 1,000 mL (03/10/24 1134)   propofol  (DIPRIVAN ) infusion Stopped (03/08/24 1015)    PRN  Meds: bisacodyl , fentaNYL  (SUBLIMAZE ) injection, ipratropium-albuterol , midazolam , mouth rinse, polyethylene glycol, sodium chloride  flush, sodium chloride  flush  Physical Exam Constitutional:      Comments: Eyes closed  Pulmonary:     Comments: Trach in place, on vent support. Skin:    General: Skin is warm and dry.             Vital Signs: BP (!) 120/54  Pulse (!) 53   Temp 98.2 F (36.8 C) (Axillary)   Resp 18   Ht 6' (1.829 m)   Wt 91.7 kg   SpO2 98%   BMI 27.42 kg/m  SpO2: SpO2: 98 % O2 Device: O2 Device: Ventilator O2 Flow Rate: O2 Flow Rate (L/min): 8 L/min  Intake/output summary:  Intake/Output Summary (Last 24 hours) at 03/10/2024 1157 Last data filed at 03/10/2024 1156 Gross per 24 hour  Intake 1858 ml  Output 1890 ml  Net -32 ml   LBM: Last BM Date : 03/10/24 Baseline Weight: Weight: 103.4 kg Most recent weight: Weight: 91.7 kg       Patient Active Problem List   Diagnosis Date Noted   Protein-calorie malnutrition, severe 03/03/2024   Cardiac arrest (HCC) 02/20/2024   Malnutrition of moderate degree 02/20/2024   Aspiration pneumonia of both lungs due to gastric secretions (HCC) 02/20/2024   HFrEF (heart failure with reduced ejection fraction) (HCC) 02/20/2024   LV (left ventricular) mural thrombus 02/20/2024   Elevated troponin 02/20/2024   Transaminitis 02/20/2024   Dilated cardiomyopathy (HCC) 02/20/2024   Schizoaffective disorder, bipolar type (HCC) 09/14/2022   Hypertensive emergency 09/13/2022   Bilateral pleural effusion 09/13/2022   Cocaine use disorder (HCC) 09/13/2022   Acute on chronic systolic CHF (congestive heart failure) (HCC) 09/10/2022   Acute respiratory failure with hypoxia (HCC) 09/10/2022   Stroke (HCC) 07/12/2018   Noncompliance 04/11/2016      Recommendations/Plan: Continuing full code/ full scope Staff working on [employer] placement.  Code Status:    Code Status Orders  (From admission, onward)           Start      Ordered   02/20/24 0934  Full code  Continuous       Question:  By:  Answer:  Default: patient does not have capacity for decision making, no surrogate or prior directive available   02/20/24 0935           Code Status History     Date Active Date Inactive Code Status Order ID Comments User Context   09/10/2022 1100 09/15/2022 2253 Full Code [id number]  Maranda Lonell MATSU, NP ED   07/12/2018 2318 07/15/2018 1504 Full Code [id number]  Jenel Lenis, MD ED       Thank you for allowing the Palliative Medicine Team to assist in the care of this patient.   Camelia Lewis, NP  Please contact Palliative Medicine Team phone at [phone] for questions and concerns.

## 2024-03-10 NOTE — Progress Notes (Signed)
 NAME:  Miguel [name], MRN:  [id number], DOB:  August 01, 1951, LOS: 19 ADMISSION DATE:  02/20/2024, CONSULTATION DATE: 02/20/2024 REFERRING MD: Dr. Levander,  CHIEF COMPLAINT: Cardiac Arrest    History of Present Illness:  73 yo male who presented to [facility] ER via EMS from jail on 06/26 post cardiac arrest.  Per ER notes pts cellmate heard the pt gurgling this morning.  He was found pulseless with initial cardiac rhythm asystole.  EMS administered 3 doses of epi/2 mg of narcan .  Following administration of narcan  ROSC achieved.  Estimated downtime 15 minutes.  EMS reported en route to the ER pt became agitated and attempted to remove Igel.  EMS administered versed .  Pt became pulseless again en route ACLS protocol initiated.  ED Course  Upon arrival to the ER pt remained pulseless in PEA.  ACLS protocol continued pt received: 2 mg of epi/4 mg of iv narcan /1 amp of bicarb/1 g of calcium .  Igel exchanged for an ETT with ongoing CPR (pt did not require RSI medications for intubation).  ROSC achieved with estimated downtime during 2nd cardiac arrest 15 minutes.  CTA Chest negative for PE.  Significant lab results were: Na+ 129/K+ 5.3/chloride 90/glucose 110/BUN 49/creatinine 2.22/calcium  7.5/mag 3.2/alk phos 134/AST 3,301/ALT 2,038/total bilirubin 5.5/troponin 344/lactic acid 8.1/urine drug screen negative/UA concerning for possible UTI.  Sepsis protocol initiated pt to receive: cefepime /metronidazole /vancomycin .  Pt found to be severely hypoglycemic CBG 17 requiring 1 amp of D50W.  Levophed  gtt initiated due to hypotension.  PCCM team contacted for ICU admission.  Pt remains in police custody with bilateral ankle cuffs in place, and police officer at bedside.    03/09/24- patient remains off sedation x 24h.  Still with GCS5T concern for CVA post cardiac arrest. Plan for MRI brain today at 2pm.  CM working on insurance due to prisoner status.   03/10/24- Patient had no abnormalities noted on MRI brain.  Patient is  working on potential transfer to [facility].   Pertinent  Medical History  Schizophrenia  Depression Current Smoker  Chronic Systolic CHF (Echo 09/11/22: EF <20%, grade II diastolic dysfunction, trivial pericardial effusion, mild mitral valve regurgitation, trivial tricuspid valve regurgitation, trivial pulmonic valve regurgitation) CVA with aphasia  Micro Data:   MRSA PCR 06/26>>negative  COVID/Influenza A&B/RSV 06/26>>negative  RVP 06/26>>negative  Blood 06/26>>NGTD  Tracheal aspirate 06/26>>H influenza and strep pneumoniae  Urine 06/26>>less than 10,000 colonies/ml insignificant growth  Legionella pneum ur ag 06/26>>negative   Anti-infectives (From admission, onward)    Start     Dose/Rate Route Frequency Ordered Stop   03/05/24 1200  fluconazole  (DIFLUCAN ) tablet 200 mg        200 mg Per Tube Daily 03/05/24 0834 03/12/24 0959   03/04/24 2200  micafungin  (MYCAMINE ) 100 mg in sodium chloride  0.9 % 100 mL IVPB  Status:  Discontinued        100 mg 105 mL/hr over 1 Hours Intravenous Daily 03/04/24 1936 03/05/24 0834   03/03/24 0600  ceFAZolin  (ANCEF ) IVPB 2g/100 mL premix        2 g 200 mL/hr over 30 Minutes Intravenous On call to O.R. 03/02/24 1432 03/04/24 0559   02/28/24 1000  piperacillin -tazobactam (ZOSYN ) IVPB 3.375 g  Status:  Discontinued        3.375 g 12.5 mL/hr over 240 Minutes Intravenous Every 8 hours 02/28/24 0815 03/03/24 1021   02/22/24 1100  cefTRIAXone  (ROCEPHIN ) 2 g in sodium chloride  0.9 % 100 mL IVPB  2 g 200 mL/hr over 30 Minutes Intravenous Every 24 hours 02/22/24 0952 02/27/24 1134   02/22/24 1100  azithromycin  (ZITHROMAX ) 500 mg in sodium chloride  0.9 % 250 mL IVPB  Status:  Discontinued        500 mg 250 mL/hr over 60 Minutes Intravenous Every 24 hours 02/22/24 0952 02/24/24 1009   02/21/24 0000  ceFEPIme  (MAXIPIME ) 2 g in sodium chloride  0.9 % 100 mL IVPB  Status:  Discontinued        2 g 200 mL/hr over 30 Minutes Intravenous Every 12 hours 02/20/24  1051 02/22/24 0952   02/20/24 1200  vancomycin  (VANCOREADY) IVPB 2000 mg/400 mL        2,000 mg 200 mL/hr over 120 Minutes Intravenous  Once 02/20/24 1007 02/20/24 1400   02/20/24 1050  vancomycin  variable dose per unstable renal function (pharmacist dosing)  Status:  Discontinued         Does not apply See admin instructions 02/20/24 1051 02/21/24 0925   02/20/24 0845  ceFEPIme  (MAXIPIME ) 2 g in sodium chloride  0.9 % 100 mL IVPB        2 g 200 mL/hr over 30 Minutes Intravenous  Once 02/20/24 0838 02/20/24 1410   02/20/24 0845  metroNIDAZOLE  (FLAGYL ) IVPB 500 mg        500 mg 100 mL/hr over 60 Minutes Intravenous  Once 02/20/24 0838 02/20/24 1559   02/20/24 0845  vancomycin  (VANCOCIN ) IVPB 1000 mg/200 mL premix  Status:  Discontinued        1,000 mg 200 mL/hr over 60 Minutes Intravenous  Once 02/20/24 0838 02/20/24 1007      Significant Hospital Events: Including procedures, antibiotic start and stop dates in addition to other pertinent events   06/26: Pt admitted post cardiac arrest mechanically intubated with possible cardiogenic/possible septic shock with multiorgan failure requiring levophed  and dobutamine  gtts 06/27: on minimal sedation, discontinued this AM. Off nor-epi, continued on dobutamine  06/28: decreased dobutamine  to 2.5, remains off sedation 06/29: arouses to tactile and verbal stimuli, remains disoriented. Failed SBT this AM. Dobutamine  increased to 5 mcg overnight due to bradycardia 06/30: Pt remains mechanically intubated, however neuro exam precludes extubation.  He has not received sedation since 06/27. Dobutamine  discontinued  06/30: MRI Brain Since the previous MRI, the patient has developed chronic           encephalomalacia changes within the right medial occipital lobe compatible with an        interval  PCA distribution infarct. Interval development of encephalomalacia in area of        previously demonstrated left MCA distribution infarct, which was acute on the         previous MRI. 07/01: Overnight pt remained in atrial flutter with rvr hr 140 to 150's with possible ST elevation despite iv metoprolol .  Cardiologist Dr Darron notified recommended amiodarone  bolus followed by amiodarone  gtt for rate control.  However, this morning while on amiodarone  gtt pt became severely bradycardic hr as low as 27 bpm with associated hypotension, amiodarone  gtt discontinued he is now requiring low dose levophed  gtt. Self extubated respiratory status stable but remained delirious requiring precedex  gtt  07/02: No significant events noted overnight.  Remains on Precedex  due to delirium, currently protecting his airway.  Afebrile, hemodynamically stable, Levophed  weaned off, Bradycardia resolved, no reports of tachyarrhythmias.  Dobutamine  weaned to 2.5 and Coox remains stable at 83, will d/c Dobutamine .  Speech/PT/OT consulted.  Failed speech evaluation, plan to place Dobhoff as able, remove  foley. 07/03: No significant events noted overnight. Remains on low dose precedex  (0.4 mcg) due to delirium.  Afebrile, hemodynamically stable, no vasopressors.  Coox 73 and Lactic 1.1 OFF of inotropes and OFF Levophed .  Dobhoff with difficultly functioning, will exchange it to a regular NGT for feeds and med administration.  Gentle diuresis with 20 mg IV Lasix  x1 dose.  Hgb decreased to 9 from 11.7, no signs of bleeding, will d/c Heparin  gtt given no thrombus on repeat Echo. 07/04: Overnight Levophed  reinitiated, Hgb dropped to 6.4 (no reports of bleeding from nursing, abdominal exam is benign), ordered for 1 unit pRBC's, Lovenox  d/c,  increase PPI to BID dosing.  With worsening Leukocytosis up to 15.5 from 12.8 and increased FiO2 requirements to up 10 L (overnight deep Nasotracheal suctioning yielded copious amounts of thick secretions) , will check PCT and start empiric Zosyn  for aspiration coverage.  WOB is improved from yesterday, on low dose Precedex  but more interactive today.  Again unable  to complete speech evaluation, will attempt NG tube placement again. 07/05: Remains confused and delirious. HR in the 140s; unresponsive to amiodarone  bolus and infusion. Transfused 1 unit of PRBCs for Hg of 7.1. Started on valium  for agitation. 07/06: Intubated overnight due to worsening mental status and thick copious secretions 07/09: Pt remains mechanically intubated pending trach placement  7/9 s/p TRACH family updated 7/10-7/11 off sedation, remains obtunded 7/12 failed vent weaning trial  Interim History / Subjective:   VENT WEANING ATTEMPT  7/10 PATIENT UNABLE TO WEAN FROM VENT 7/13 patient with increased WOB with weaning trial     Remains critically ill Remains s/p trach Requires VENT support for survival  Vent Mode: PRVC FiO2 (%):  [28 %] 28 % Set Rate:  [18 bmp] 18 bmp Vt Set:  [500 mL] 500 mL PEEP:  [5 cmH20] 5 cmH20 Pressure Support:  [5 cmH20] 5 cmH20   Objective    Blood pressure (!) 122/52, pulse 71, temperature 98.2 F (36.8 C), temperature source Axillary, resp. rate 18, height 6' (1.829 m), weight 91.7 kg, SpO2 98%.    Vent Mode: PRVC FiO2 (%):  [28 %] 28 % Set Rate:  [18 bmp] 18 bmp Vt Set:  [500 mL] 500 mL PEEP:  [5 cmH20] 5 cmH20 Pressure Support:  [5 cmH20] 5 cmH20   Intake/Output Summary (Last 24 hours) at 03/10/2024 0915 Last data filed at 03/10/2024 0530 Gross per 24 hour  Intake 1658 ml  Output 2185 ml  Net -527 ml   Filed Weights   03/08/24 0400 03/09/24 0400 03/10/24 0400  Weight: 91 kg 91.3 kg 91.7 kg      REVIEW OF SYSTEMS  PATIENT IS UNABLE TO PROVIDE COMPLETE REVIEW OF SYSTEMS DUE TO SEVERE CRITICAL ILLNESS   PHYSICAL EXAMINATION:  GENERAL:critically ill appearing, +resp distress EYES: Pupils equal, round, reactive to light.  No scleral icterus.  MOUTH: Moist mucosal membrane. S/p trach NECK: Supple.  PULMONARY: Lungs clear to auscultation, +rhonchi, +wheezing CARDIOVASCULAR: S1 and S2.  Regular rate and  rhythm GASTROINTESTINAL: Soft, nontender, -distended. Positive bowel sounds.  MUSCULOSKELETAL: No swelling, clubbing, or edema.  NEUROLOGIC: sedated SKIN:normal, warm to touch, Capillary refill delayed  Pulses present bilaterally   Resolved problem list  Hyponatremia  Lactic acidosis  Hyperkalemia   Assessment and Plan   72 yo AAM admitted for Cardiac arrest (PEA) Cardiogenic/possible septic shock H INFL and STREP pneumonia with Acute on chronic systolic CHF Elevated troponin suspect secondary to demand ischemia,Possible mural thrombus,Atrial flutter with rvr ,  Hx: CVA with aphasia, failure to wean from vent and s/p TRACH, patient with end stage schizophrenia   Severe ACUTE Hypoxic and Hypercapnic Respiratory Failure -continue Mechanical Ventilator support -Wean Fio2 and PEEP as tolerated -VAP/VENT bundle implementation - Wean PEEP & FiO2 as tolerated, maintain SpO2 > 88% - Head of bed elevated 30 degrees, VAP protocol in place - Plateau pressures less than 30 cm H20  - Intermittent chest x-ray & ABG PRN - Ensure adequate pulmonary hygiene  CTA Chest PE 02/20/24: No evidence of acute pulmonary embolism. Rounded filling defect within the right atrial appendage, suspicious for thrombus. Echo 02/20/24: EF 20 to 25%   VENT WEANING ATTEMPT  7/10 PATIENT UNABLE TO WEAN FROM VENT 7/13 patient with increased WOB with weaning trial   NEUROLOGY ACUTE METABOLIC ENCEPHALOPATHY Acute metabolic encephalopathy: concerning for possible anoxic brain injury  Hx: Polysubstance abuse, depression, and schizoaffective disorder EEG 02/21/24: Study is suggestive of severe to profound diffuse encephalopathy. No seizures or epileptiform discharges were seen throughout the recording. MRI Brain 02/24/24: Since the previous MRI, the patient has developed chronic encephalomalacia changes within the right medial occipital lobe compatible with an interval PCA distribution infarct. Interval development of  encephalomalacia in area of previously demonstrated left MCA distribution infarct, which was acute on the previous MRI. - Urine drug screen negative  - Correct metabolic derangements  - WUA daily  - Avoid sedation medication as able    CARDIAC FAILURE- EF 20% -Lasix  as tolerated -follow up cardiac enzymes as indicated     RENAL -continue Foley Catheter-assess need -Avoid nephrotoxic agents -Follow urine output, BMP -Ensure adequate renal perfusion, optimize oxygenation -Renal dose medications   Intake/Output Summary (Last 24 hours) at 03/10/2024 0915 Last data filed at 03/10/2024 0530 Gross per 24 hour  Intake 1658 ml  Output 2185 ml  Net -527 ml     ENDO - ICU hypoglycemic\Hyperglycemia protocol -check FSBS per protocol   GI GI PROPHYLAXIS as indicated NUTRITIONAL STATUS DIET-->TF's as tolerated Constipation protocol as indicated   ELECTROLYTES -follow labs as needed -replace as needed -pharmacy consultation and following  RESTRICTIVE TRANSFUSION PROTOCOL TRANSFUSION  IF HGB<7  or ACTIVE BLEEDING OR DX of ACUTE CORONARY SYNDROMES        Best Practice (right click and Reselect all SmartList Selections daily)   Diet/type: TF's DVT prophylaxis SCD's Pressure ulcer(s): N/A GI prophylaxis: PPI Lines: Central line (Right Brachial PICC line placed 03/03/24) Foley:  N/A Code Status:  full code Last date of multidisciplinary goals of care discussion [03/04/24]  Labs   CBC: Recent Labs  Lab 03/06/24 0415 03/07/24 0500 03/08/24 0343 03/09/24 0350 03/10/24 0415  WBC 19.2* 16.3* 14.5* 13.7* 11.9*  HGB 7.6* 8.0* 7.8* 8.2* 8.3*  HCT 24.2* 25.9* 25.6* 26.5* 27.1*  MCV 97.6 101.6* 103.2* 104.3* 103.4*  PLT 524* 521* 519* 585* 598*    Basic Metabolic Panel: Recent Labs  Lab 03/04/24 0400 03/05/24 0401 03/06/24 0415 03/07/24 0500 03/08/24 0343 03/09/24 0350 03/10/24 0415  NA 144 141 144 144 142 143 144  K 3.8 4.1 3.9 4.7 4.3 4.6 4.9  CL  110 104 107 107 107 109 107  CO2 27 27 29  32 30 29 32  GLUCOSE 95 136* 117* 134* 159* 131* 105*  BUN 34* 42* 48* 49* 46* 43* 43*  CREATININE 1.16 1.02 1.13 1.31* 1.07 0.72 0.96  CALCIUM  7.7* 7.8* 7.6* 8.0* 7.8* 7.9* 7.8*  MG 2.3 2.2 2.2 2.2 2.1 2.3 2.2  PHOS 2.4* 3.2 2.8  --   --   --   --  GFR: Estimated Creatinine Clearance: 77.5 mL/min (by C-G formula based on SCr of 0.96 mg/dL). Recent Labs  Lab 03/07/24 0500 03/08/24 0343 03/09/24 0350 03/10/24 0415  WBC 16.3* 14.5* 13.7* 11.9*    Liver Function Tests: Recent Labs  Lab 03/04/24 0400  AST 57*  ALT 93*  ALKPHOS 56  BILITOT 1.9*  PROT 6.1*  ALBUMIN 2.1*   No results for input(s): LIPASE, AMYLASE in the last 168 hours. No results for input(s): AMMONIA in the last 168 hours.  ABG    Component Value Date/Time   PHART 7.52 (H) 03/04/2024 0647   PCO2ART 40 03/04/2024 0647   PO2ART 74 (L) 03/04/2024 0647   HCO3 32.7 (H) 03/04/2024 0647   ACIDBASEDEF 1.1 02/20/2024 1452   O2SAT 97.9 03/04/2024 0647     Coagulation Profile: Recent Labs  Lab 03/04/24 0039  INR 1.4*    Cardiac Enzymes: No results for input(s): CKTOTAL, CKMB, CKMBINDEX, TROPONINI in the last 168 hours.  HbA1C: Hgb A1c MFr Bld  Date/Time Value Ref Range Status  07/14/2018 04:29 AM 5.8 (H) 4.8 - 5.6 % Final    Comment:    (NOTE) Pre diabetes:          5.7%-6.4% Diabetes:              >6.4% Glycemic control for   <7.0% adults with diabetes   07/13/2018 04:25 AM 6.2 (H) 4.8 - 5.6 % Final    Comment:    (NOTE) Pre diabetes:          5.7%-6.4% Diabetes:              >6.4% Glycemic control for   <7.0% adults with diabetes     CBG: Recent Labs  Lab 03/09/24 1536 03/09/24 2007 03/09/24 2357 03/10/24 0404 03/10/24 0754  GLUCAP 86 118* 110* 100* 114*      DVT/GI PRX  assessed I Assessed the need for Labs I Assessed the need for Foley I Assessed the need for Central Venous Line Family Discussion when available I  Assessed the need for Mobilization I made an Assessment of medications to be adjusted accordingly Safety Risk assessment completed  CASE DISCUSSED IN MULTIDISCIPLINARY ROUNDS WITH ICU TEAM   Critical care provider statement:   Total critical care time: 33 minutes   Performed by: Parris MD   Critical care time was exclusive of separately billable procedures and treating other patients.   Critical care was necessary to treat or prevent imminent or life-threatening deterioration.   Critical care was time spent personally by me on the following activities: development of treatment plan with patient and/or surrogate as well as nursing, discussions with consultants, evaluation of patient's response to treatment, examination of patient, obtaining history from patient or surrogate, ordering and performing treatments and interventions, ordering and review of laboratory studies, ordering and review of radiographic studies, pulse oximetry and re-evaluation of patient's condition.    Puanani Gene, M.D.  Pulmonary & Critical Care Medicine

## 2024-03-10 NOTE — Progress Notes (Signed)
 0011 - Pt noted to be belly breathing with RR 24-28, HR increasing up to high 80's, and BP increasing throughout the shift. Pt suctioned and repositioned without resolution. RT at bedside. Provider notified. Pt placed back on PRVC mode by RT. Resting more comfortably.   0040 - After RT placed pt back into PRVC mode, pt HR noted downtrend into 40's-50's. Pt peak pressures 34-35 despite oral and tracheal suctioning and repositioning pt. Provider and RT at bedside. PRN Versed  order received.

## 2024-03-10 NOTE — Plan of Care (Signed)
  Problem: Clinical Measurements: Goal: Will remain free from infection Outcome: Progressing Goal: Diagnostic test results will improve Outcome: Progressing Goal: Respiratory complications will improve Outcome: Progressing   Problem: Activity: Goal: Risk for activity intolerance will decrease Outcome: Progressing   

## 2024-03-11 ENCOUNTER — Inpatient Hospital Stay

## 2024-03-11 DIAGNOSIS — G931 Anoxic brain damage, not elsewhere classified: Secondary | ICD-10-CM | POA: Diagnosis not present

## 2024-03-11 DIAGNOSIS — Z7189 Other specified counseling: Secondary | ICD-10-CM | POA: Diagnosis not present

## 2024-03-11 DIAGNOSIS — J690 Pneumonitis due to inhalation of food and vomit: Secondary | ICD-10-CM | POA: Diagnosis not present

## 2024-03-11 DIAGNOSIS — Z4682 Encounter for fitting and adjustment of non-vascular catheter: Secondary | ICD-10-CM | POA: Diagnosis not present

## 2024-03-11 DIAGNOSIS — I469 Cardiac arrest, cause unspecified: Secondary | ICD-10-CM | POA: Diagnosis not present

## 2024-03-11 DIAGNOSIS — I5020 Unspecified systolic (congestive) heart failure: Secondary | ICD-10-CM | POA: Diagnosis not present

## 2024-03-11 LAB — CBC
HCT: 27.4 % — ABNORMAL LOW (ref 39.0–52.0)
Hemoglobin: 8.4 g/dL — ABNORMAL LOW (ref 13.0–17.0)
MCH: 31.5 pg (ref 26.0–34.0)
MCHC: 30.7 g/dL (ref 30.0–36.0)
MCV: 102.6 fL — ABNORMAL HIGH (ref 80.0–100.0)
Platelets: 622 K/uL — ABNORMAL HIGH (ref 150–400)
RBC: 2.67 MIL/uL — ABNORMAL LOW (ref 4.22–5.81)
RDW: 16.8 % — ABNORMAL HIGH (ref 11.5–15.5)
WBC: 10.8 K/uL — ABNORMAL HIGH (ref 4.0–10.5)
nRBC: 0.6 % — ABNORMAL HIGH (ref 0.0–0.2)

## 2024-03-11 LAB — CULTURE, RESPIRATORY W GRAM STAIN

## 2024-03-11 LAB — GLUCOSE, CAPILLARY
Glucose-Capillary: 106 mg/dL — ABNORMAL HIGH (ref 70–99)
Glucose-Capillary: 117 mg/dL — ABNORMAL HIGH (ref 70–99)
Glucose-Capillary: 123 mg/dL — ABNORMAL HIGH (ref 70–99)
Glucose-Capillary: 127 mg/dL — ABNORMAL HIGH (ref 70–99)
Glucose-Capillary: 131 mg/dL — ABNORMAL HIGH (ref 70–99)
Glucose-Capillary: 98 mg/dL (ref 70–99)

## 2024-03-11 LAB — PHOSPHORUS: Phosphorus: 2.6 mg/dL (ref 2.5–4.6)

## 2024-03-11 LAB — BASIC METABOLIC PANEL WITH GFR
Anion gap: 5 (ref 5–15)
BUN: 50 mg/dL — ABNORMAL HIGH (ref 8–23)
CO2: 30 mmol/L (ref 22–32)
Calcium: 7.9 mg/dL — ABNORMAL LOW (ref 8.9–10.3)
Chloride: 110 mmol/L (ref 98–111)
Creatinine, Ser: 0.91 mg/dL (ref 0.61–1.24)
GFR, Estimated: >60 mL/min (ref >60)
Glucose, Bld: 118 mg/dL — ABNORMAL HIGH (ref 70–99)
Potassium: 4.3 mmol/L (ref 3.5–5.1)
Sodium: 145 mmol/L (ref 135–145)

## 2024-03-11 LAB — MAGNESIUM: Magnesium: 2.2 mg/dL (ref 1.7–2.4)

## 2024-03-11 MED ORDER — ENOXAPARIN SODIUM 40 MG/0.4ML IJ SOSY
40.0000 mg | PREFILLED_SYRINGE | INTRAMUSCULAR | Status: DC
  Administered 2024-03-11: 40 mg via SUBCUTANEOUS
  Filled 2024-03-11: qty 0.4

## 2024-03-11 NOTE — Progress Notes (Signed)
 PHARMACY CONSULT NOTE  Pharmacy Consult for Electrolyte Monitoring and Replacement   Recent Labs: Potassium (mmol/L)  Date Value  03/11/2024 4.3  09/15/2012 4.4   Magnesium  (mg/dL)  Date Value  92/83/7974 2.2   Calcium  (mg/dL)  Date Value  92/83/7974 7.9 (L)   Calcium , Total (mg/dL)  Date Value  98/79/7985 9.4   Albumin (g/dL)  Date Value  92/90/7974 2.1 (L)  09/15/2012 4.2   Phosphorus (mg/dL)  Date Value  92/83/7974 2.6   Sodium (mmol/L)  Date Value  03/11/2024 145  09/15/2012 146 (H)   Assessment: 73 y/o male with h/o CHF, dilated cardiomyopathy, substance abuse, schizophrenia, CVA, depression, HCV, homelessness and recently in jail who is admitted with PEA arrest, AKI, CHF with bilateral pleural effusions, thrombus, shock, H influenza and strep pneumoniae and sepsis. Pharmacy is asked to follow and replace electrolytes while in CCU  Goal of Therapy:  Potassium 4.0 - 5.1 mmol/L Magnesium  2.0 - 2.4 mg/dL All Other Electrolytes WNL  Plan:  --no electrolyte replacement warranted for today --Will continue to monitor peripherally  Miguel [name] 03/11/2024 7:14 AM

## 2024-03-11 NOTE — Progress Notes (Signed)
 Daily Progress Note   Patient Name: Miguel [name]       Date: 03/11/2024 DOB: 05-23-1951  Age: 73 y.o. MRN#: [id number] Attending Physician: Parris Manna, MD Primary Care Physician: [facility] Admit Date: 02/20/2024  Reason for Consultation/Follow-up: Establishing goals of care  Subjective: Notes and labs reviewed.  In to see patient.  He is currently sitting in bed at this time with trach in place, on ventilator support.  Dobbhoff tube in place.  No family at bedside.  He does not awaken or attempt to interact with me.  Per nursing, patient did interact with neurology.  Hopeful for patient's continued improvement as following patient's self extubation earlier this admission he was alert, interactive, and communicating with family and staff. Waiting for LTAC.  Length of Stay: 20  Current Medications: Scheduled Meds:   amiodarone   400 mg Per Tube Daily   ARIPiprazole   30 mg Per Tube QHS   Chlorhexidine  Gluconate Cloth  6 each Topical Daily   digoxin   0.125 mg Per NG tube Daily   docusate  100 mg Per Tube BID   enoxaparin  (LOVENOX ) injection  40 mg Subcutaneous Q24H   feeding supplement (PROSource TF20)  60 mL Per Tube BID   free water   100 mL Per Tube Q4H   multivitamin with minerals  1 tablet Per Tube Daily   nutrition supplement (JUVEN)  1 packet Per Tube BID BM   mouth rinse  15 mL Mouth Rinse Q2H   oxyCODONE   5 mg Per Tube Q6H   pantoprazole  (PROTONIX ) IV  40 mg Intravenous Q12H   sodium chloride  flush  10-40 mL Intracatheter Q12H   sodium chloride  flush  10-40 mL Intracatheter Q12H   thiamine   100 mg Per Tube Daily    Continuous Infusions:  feeding supplement (OSMOLITE 1.5 CAL) 60 mL/hr at 03/11/24 0701    PRN Meds: bisacodyl , fentaNYL  (SUBLIMAZE )  injection, ipratropium-albuterol , midazolam , mouth rinse, polyethylene glycol, sodium chloride  flush, sodium chloride  flush  Physical Exam Constitutional:      Comments: Eyes closed  Pulmonary:     Comments: On ventilator support Skin:    General: Skin is warm and dry.             Vital Signs: BP 132/62   Pulse 65   Temp 97.9 F (36.6  C) (Axillary)   Resp 14   Ht 6' (1.829 m)   Wt 93.9 kg   SpO2 99%   BMI 28.08 kg/m  SpO2: SpO2: 99 % O2 Device: O2 Device: Ventilator O2 Flow Rate: O2 Flow Rate (L/min): 8 L/min  Intake/output summary:  Intake/Output Summary (Last 24 hours) at 03/11/2024 1503 Last data filed at 03/11/2024 1336 Gross per 24 hour  Intake 2201 ml  Output 1350 ml  Net 851 ml   LBM: Last BM Date : 03/10/24 Baseline Weight: Weight: 103.4 kg Most recent weight: Weight: 93.9 kg    Patient Active Problem List   Diagnosis Date Noted   Protein-calorie malnutrition, severe 03/03/2024   Cardiac arrest (HCC) 02/20/2024   Malnutrition of moderate degree 02/20/2024   Aspiration pneumonia of both lungs due to gastric secretions (HCC) 02/20/2024   HFrEF (heart failure with reduced ejection fraction) (HCC) 02/20/2024   LV (left ventricular) mural thrombus 02/20/2024   Elevated troponin 02/20/2024   Transaminitis 02/20/2024   Dilated cardiomyopathy (HCC) 02/20/2024   Schizoaffective disorder, bipolar type (HCC) 09/14/2022   Hypertensive emergency 09/13/2022   Bilateral pleural effusion 09/13/2022   Cocaine use disorder (HCC) 09/13/2022   Acute on chronic systolic CHF (congestive heart failure) (HCC) 09/10/2022   Acute respiratory failure with hypoxia (HCC) 09/10/2022   Stroke (HCC) 07/12/2018   Noncompliance 04/11/2016    Palliative Care Assessment & Plan    Recommendations/Plan: Patient waiting for LTAC placement.  Code Status:    Code Status Orders  (From admission, onward)           Start     Ordered   02/20/24 0934  Full code  Continuous        Question:  By:  Answer:  Default: patient does not have capacity for decision making, no surrogate or prior directive available   02/20/24 0935           Code Status History     Date Active Date Inactive Code Status Order ID Comments User Context   09/10/2022 1100 09/15/2022 2253 Full Code [id number]  Maranda Lonell MATSU, NP ED   07/12/2018 2318 07/15/2018 1504 Full Code [id number]  Jenel Lenis, MD ED        Camelia Lewis, NP  Please contact Palliative Medicine Team phone at [phone] for questions and concerns.

## 2024-03-11 NOTE — Progress Notes (Signed)
 NAME:  Miguel [name], MRN:  [id number], DOB:  1951-04-05, LOS: 20 ADMISSION DATE:  02/20/2024, CONSULTATION DATE: 02/20/2024 REFERRING MD: Dr. Levander,  CHIEF COMPLAINT: Cardiac Arrest    History of Present Illness:  73 yo male who presented to [facility] ER via EMS from jail on 06/26 post cardiac arrest.  Per ER notes pts cellmate heard the pt gurgling this morning.  He was found pulseless with initial cardiac rhythm asystole.  EMS administered 3 doses of epi/2 mg of narcan .  Following administration of narcan  ROSC achieved.  Estimated downtime 15 minutes.  EMS reported en route to the ER pt became agitated and attempted to remove Igel.  EMS administered versed .  Pt became pulseless again en route ACLS protocol initiated.  ED Course  Upon arrival to the ER pt remained pulseless in PEA.  ACLS protocol continued pt received: 2 mg of epi/4 mg of iv narcan /1 amp of bicarb/1 g of calcium .  Igel exchanged for an ETT with ongoing CPR (pt did not require RSI medications for intubation).  ROSC achieved with estimated downtime during 2nd cardiac arrest 15 minutes.  CTA Chest negative for PE.  Significant lab results were: Na+ 129/K+ 5.3/chloride 90/glucose 110/BUN 49/creatinine 2.22/calcium  7.5/mag 3.2/alk phos 134/AST 3,301/ALT 2,038/total bilirubin 5.5/troponin 344/lactic acid 8.1/urine drug screen negative/UA concerning for possible UTI.  Sepsis protocol initiated pt to receive: cefepime /metronidazole /vancomycin .  Pt found to be severely hypoglycemic CBG 17 requiring 1 amp of D50W.  Levophed  gtt initiated due to hypotension.  PCCM team contacted for ICU admission.  Pt remains in police custody with bilateral ankle cuffs in place, and police officer at bedside.    03/09/24- patient remains off sedation x 24h.  Still with GCS5T concern for CVA post cardiac arrest. Plan for MRI brain today at 2pm.  CM working on insurance due to prisoner status.   03/10/24- Patient had no abnormalities noted on MRI brain.  Patient is  working on potential transfer to [facility].  03/11/24- patient remains severely encephalopathic.  Vitals stable with no arrythmias overnight. Trache exchanged yesterday.  Tube feeds ongoing. Appeal in process for LTACH. We will work on Dobhoff in the interim.   Pertinent  Medical History  Schizophrenia  Depression Current Smoker  Chronic Systolic CHF (Echo 09/11/22: EF <20%, grade II diastolic dysfunction, trivial pericardial effusion, mild mitral valve regurgitation, trivial tricuspid valve regurgitation, trivial pulmonic valve regurgitation) CVA with aphasia  Micro Data:   MRSA PCR 06/26>>negative  COVID/Influenza A&B/RSV 06/26>>negative  RVP 06/26>>negative  Blood 06/26>>NGTD  Tracheal aspirate 06/26>>H influenza and strep pneumoniae  Urine 06/26>>less than 10,000 colonies/ml insignificant growth  Legionella pneum ur ag 06/26>>negative   Anti-infectives (From admission, onward)    Start     Dose/Rate Route Frequency Ordered Stop   03/05/24 1200  fluconazole  (DIFLUCAN ) tablet 200 mg        200 mg Per Tube Daily 03/05/24 0834 03/12/24 0959   03/04/24 2200  micafungin  (MYCAMINE ) 100 mg in sodium chloride  0.9 % 100 mL IVPB  Status:  Discontinued        100 mg 105 mL/hr over 1 Hours Intravenous Daily 03/04/24 1936 03/05/24 0834   03/03/24 0600  ceFAZolin  (ANCEF ) IVPB 2g/100 mL premix        2 g 200 mL/hr over 30 Minutes Intravenous On call to O.R. 03/02/24 1432 03/04/24 0559   02/28/24 1000  piperacillin -tazobactam (ZOSYN ) IVPB 3.375 g  Status:  Discontinued        3.375 g 12.5 mL/hr over 240  Minutes Intravenous Every 8 hours 02/28/24 0815 03/03/24 1021   02/22/24 1100  cefTRIAXone  (ROCEPHIN ) 2 g in sodium chloride  0.9 % 100 mL IVPB        2 g 200 mL/hr over 30 Minutes Intravenous Every 24 hours 02/22/24 0952 02/27/24 1134   02/22/24 1100  azithromycin  (ZITHROMAX ) 500 mg in sodium chloride  0.9 % 250 mL IVPB  Status:  Discontinued        500 mg 250 mL/hr over 60 Minutes Intravenous  Every 24 hours 02/22/24 0952 02/24/24 1009   02/21/24 0000  ceFEPIme  (MAXIPIME ) 2 g in sodium chloride  0.9 % 100 mL IVPB  Status:  Discontinued        2 g 200 mL/hr over 30 Minutes Intravenous Every 12 hours 02/20/24 1051 02/22/24 0952   02/20/24 1200  vancomycin  (VANCOREADY) IVPB 2000 mg/400 mL        2,000 mg 200 mL/hr over 120 Minutes Intravenous  Once 02/20/24 1007 02/20/24 1400   02/20/24 1050  vancomycin  variable dose per unstable renal function (pharmacist dosing)  Status:  Discontinued         Does not apply See admin instructions 02/20/24 1051 02/21/24 0925   02/20/24 0845  ceFEPIme  (MAXIPIME ) 2 g in sodium chloride  0.9 % 100 mL IVPB        2 g 200 mL/hr over 30 Minutes Intravenous  Once 02/20/24 0838 02/20/24 1410   02/20/24 0845  metroNIDAZOLE  (FLAGYL ) IVPB 500 mg        500 mg 100 mL/hr over 60 Minutes Intravenous  Once 02/20/24 0838 02/20/24 1559   02/20/24 0845  vancomycin  (VANCOCIN ) IVPB 1000 mg/200 mL premix  Status:  Discontinued        1,000 mg 200 mL/hr over 60 Minutes Intravenous  Once 02/20/24 0838 02/20/24 1007      Significant Hospital Events: Including procedures, antibiotic start and stop dates in addition to other pertinent events   06/26: Pt admitted post cardiac arrest mechanically intubated with possible cardiogenic/possible septic shock with multiorgan failure requiring levophed  and dobutamine  gtts 06/27: on minimal sedation, discontinued this AM. Off nor-epi, continued on dobutamine  06/28: decreased dobutamine  to 2.5, remains off sedation 06/29: arouses to tactile and verbal stimuli, remains disoriented. Failed SBT this AM. Dobutamine  increased to 5 mcg overnight due to bradycardia 06/30: Pt remains mechanically intubated, however neuro exam precludes extubation.  He has not received sedation since 06/27. Dobutamine  discontinued  06/30: MRI Brain Since the previous MRI, the patient has developed chronic           encephalomalacia changes within the right  medial occipital lobe compatible with an        interval  PCA distribution infarct. Interval development of encephalomalacia in area of        previously demonstrated left MCA distribution infarct, which was acute on the        previous MRI. 07/01: Overnight pt remained in atrial flutter with rvr hr 140 to 150's with possible ST elevation despite iv metoprolol .  Cardiologist Dr Darron notified recommended amiodarone  bolus followed by amiodarone  gtt for rate control.  However, this morning while on amiodarone  gtt pt became severely bradycardic hr as low as 27 bpm with associated hypotension, amiodarone  gtt discontinued he is now requiring low dose levophed  gtt. Self extubated respiratory status stable but remained delirious requiring precedex  gtt  07/02: No significant events noted overnight.  Remains on Precedex  due to delirium, currently protecting his airway.  Afebrile, hemodynamically stable, Levophed  weaned off, Bradycardia  resolved, no reports of tachyarrhythmias.  Dobutamine  weaned to 2.5 and Coox remains stable at 83, will d/c Dobutamine .  Speech/PT/OT consulted.  Failed speech evaluation, plan to place Dobhoff as able, remove foley. 07/03: No significant events noted overnight. Remains on low dose precedex  (0.4 mcg) due to delirium.  Afebrile, hemodynamically stable, no vasopressors.  Coox 73 and Lactic 1.1 OFF of inotropes and OFF Levophed .  Dobhoff with difficultly functioning, will exchange it to a regular NGT for feeds and med administration.  Gentle diuresis with 20 mg IV Lasix  x1 dose.  Hgb decreased to 9 from 11.7, no signs of bleeding, will d/c Heparin  gtt given no thrombus on repeat Echo. 07/04: Overnight Levophed  reinitiated, Hgb dropped to 6.4 (no reports of bleeding from nursing, abdominal exam is benign), ordered for 1 unit pRBC's, Lovenox  d/c,  increase PPI to BID dosing.  With worsening Leukocytosis up to 15.5 from 12.8 and increased FiO2 requirements to up 10 L (overnight deep  Nasotracheal suctioning yielded copious amounts of thick secretions) , will check PCT and start empiric Zosyn  for aspiration coverage.  WOB is improved from yesterday, on low dose Precedex  but more interactive today.  Again unable to complete speech evaluation, will attempt NG tube placement again. 07/05: Remains confused and delirious. HR in the 140s; unresponsive to amiodarone  bolus and infusion. Transfused 1 unit of PRBCs for Hg of 7.1. Started on valium  for agitation. 07/06: Intubated overnight due to worsening mental status and thick copious secretions 07/09: Pt remains mechanically intubated pending trach placement  7/9 s/p TRACH family updated 7/10-7/11 off sedation, remains obtunded 7/12 failed vent weaning trial  Interim History / Subjective:   VENT WEANING ATTEMPT  7/10 PATIENT UNABLE TO WEAN FROM VENT 7/13 patient with increased WOB with weaning trial     Remains critically ill Remains s/p trach Requires VENT support for survival  Vent Mode: PSV;CPAP FiO2 (%):  [28 %] 28 % Set Rate:  [18 bmp] 18 bmp Vt Set:  [500 mL] 500 mL PEEP:  [5 cmH20] 5 cmH20 Pressure Support:  [10 cmH20] 10 cmH20   Objective    Blood pressure (!) 121/58, pulse (!) 53, temperature 97.9 F (36.6 C), temperature source Axillary, resp. rate 18, height 6' (1.829 m), weight 93.9 kg, SpO2 100%.    Vent Mode: PSV;CPAP FiO2 (%):  [28 %] 28 % Set Rate:  [18 bmp] 18 bmp Vt Set:  [500 mL] 500 mL PEEP:  [5 cmH20] 5 cmH20 Pressure Support:  [10 cmH20] 10 cmH20   Intake/Output Summary (Last 24 hours) at 03/11/2024 0902 Last data filed at 03/11/2024 0840 Gross per 24 hour  Intake 2221 ml  Output 1350 ml  Net 871 ml   Filed Weights   03/09/24 0400 03/10/24 0400 03/11/24 0358  Weight: 91.3 kg 91.7 kg 93.9 kg      REVIEW OF SYSTEMS  PATIENT IS UNABLE TO PROVIDE COMPLETE REVIEW OF SYSTEMS DUE TO SEVERE CRITICAL ILLNESS   PHYSICAL EXAMINATION:  GENERAL:critically ill appearing, +resp  distress EYES: Pupils equal, round, reactive to light.  No scleral icterus.  MOUTH: Moist mucosal membrane. S/p trach NECK: Supple.  PULMONARY: Lungs clear to auscultation, +rhonchi, +wheezing CARDIOVASCULAR: S1 and S2.  Regular rate and rhythm GASTROINTESTINAL: Soft, nontender, -distended. Positive bowel sounds.  MUSCULOSKELETAL: No swelling, clubbing, or edema.  NEUROLOGIC: sedated SKIN:normal, warm to touch, Capillary refill delayed  Pulses present bilaterally   Resolved problem list  Hyponatremia  Lactic acidosis  Hyperkalemia   Assessment and Plan  73 yo AAM admitted for Cardiac arrest (PEA) Cardiogenic/possible septic shock H INFL and STREP pneumonia with Acute on chronic systolic CHF Elevated troponin suspect secondary to demand ischemia,Possible mural thrombus,Atrial flutter with rvr ,Hx: CVA with aphasia, failure to wean from vent and s/p TRACH, patient with end stage schizophrenia   Severe ACUTE Hypoxic and Hypercapnic Respiratory Failure -continue Mechanical Ventilator support -Wean Fio2 and PEEP as tolerated -VAP/VENT bundle implementation - Wean PEEP & FiO2 as tolerated, maintain SpO2 > 88% - Head of bed elevated 30 degrees, VAP protocol in place - Plateau pressures less than 30 cm H20  - Intermittent chest x-ray & ABG PRN - Ensure adequate pulmonary hygiene  CTA Chest PE 02/20/24: No evidence of acute pulmonary embolism. Rounded filling defect within the right atrial appendage, suspicious for thrombus. Echo 02/20/24: EF 20 to 25%   VENT WEANING ATTEMPT  7/10 PATIENT UNABLE TO WEAN FROM VENT 7/13 patient with increased WOB with weaning trial   NEUROLOGY ACUTE METABOLIC ENCEPHALOPATHY Acute metabolic encephalopathy: concerning for possible anoxic brain injury  Hx: Polysubstance abuse, depression, and schizoaffective disorder EEG 02/21/24: Study is suggestive of severe to profound diffuse encephalopathy. No seizures or epileptiform discharges were seen  throughout the recording. MRI Brain 02/24/24: Since the previous MRI, the patient has developed chronic encephalomalacia changes within the right medial occipital lobe compatible with an interval PCA distribution infarct. Interval development of encephalomalacia in area of previously demonstrated left MCA distribution infarct, which was acute on the previous MRI. - Urine drug screen negative  - Correct metabolic derangements  - WUA daily  - Avoid sedation medication as able    CARDIAC FAILURE- EF 20% -Lasix  as tolerated -follow up cardiac enzymes as indicated     RENAL -continue Foley Catheter-assess need -Avoid nephrotoxic agents -Follow urine output, BMP -Ensure adequate renal perfusion, optimize oxygenation -Renal dose medications   Intake/Output Summary (Last 24 hours) at 03/11/2024 0902 Last data filed at 03/11/2024 0840 Gross per 24 hour  Intake 2221 ml  Output 1350 ml  Net 871 ml     ENDO - ICU hypoglycemic\Hyperglycemia protocol -check FSBS per protocol   GI GI PROPHYLAXIS as indicated NUTRITIONAL STATUS DIET-->TF's as tolerated Constipation protocol as indicated   ELECTROLYTES -follow labs as needed -replace as needed -pharmacy consultation and following  RESTRICTIVE TRANSFUSION PROTOCOL TRANSFUSION  IF HGB<7  or ACTIVE BLEEDING OR DX of ACUTE CORONARY SYNDROMES        Best Practice (right click and Reselect all SmartList Selections daily)   Diet/type: TF's DVT prophylaxis SCD's Pressure ulcer(s): N/A GI prophylaxis: PPI Lines: Central line (Right Brachial PICC line placed 03/03/24) Foley:  N/A Code Status:  full code Last date of multidisciplinary goals of care discussion [03/04/24]  Labs   CBC: Recent Labs  Lab 03/07/24 0500 03/08/24 0343 03/09/24 0350 03/10/24 0415 03/11/24 0515  WBC 16.3* 14.5* 13.7* 11.9* 10.8*  HGB 8.0* 7.8* 8.2* 8.3* 8.4*  HCT 25.9* 25.6* 26.5* 27.1* 27.4*  MCV 101.6* 103.2* 104.3* 103.4* 102.6*  PLT  521* 519* 585* 598* 622*    Basic Metabolic Panel: Recent Labs  Lab 03/05/24 0401 03/06/24 0415 03/07/24 0500 03/08/24 0343 03/09/24 0350 03/10/24 0415 03/11/24 0515  NA 141 144 144 142 143 144 145  K 4.1 3.9 4.7 4.3 4.6 4.9 4.3  CL 104 107 107 107 109 107 110  CO2 27 29 32 30 29 32 30  GLUCOSE 136* 117* 134* 159* 131* 105* 118*  BUN 42* 48* 49* 46* 43* 43*  50*  CREATININE 1.02 1.13 1.31* 1.07 0.72 0.96 0.91  CALCIUM  7.8* 7.6* 8.0* 7.8* 7.9* 7.8* 7.9*  MG 2.2 2.2 2.2 2.1 2.3 2.2 2.2  PHOS 3.2 2.8  --   --   --   --  2.6   GFR: Estimated Creatinine Clearance: 88.6 mL/min (by C-G formula based on SCr of 0.91 mg/dL). Recent Labs  Lab 03/08/24 0343 03/09/24 0350 03/10/24 0415 03/11/24 0515  WBC 14.5* 13.7* 11.9* 10.8*    Liver Function Tests: No results for input(s): AST, ALT, ALKPHOS, BILITOT, PROT, ALBUMIN in the last 168 hours.  No results for input(s): LIPASE, AMYLASE in the last 168 hours. No results for input(s): AMMONIA in the last 168 hours.  ABG    Component Value Date/Time   PHART 7.52 (H) 03/04/2024 0647   PCO2ART 40 03/04/2024 0647   PO2ART 74 (L) 03/04/2024 0647   HCO3 32.7 (H) 03/04/2024 0647   ACIDBASEDEF 1.1 02/20/2024 1452   O2SAT 97.9 03/04/2024 0647     Coagulation Profile: No results for input(s): INR, PROTIME in the last 168 hours.   Cardiac Enzymes: No results for input(s): CKTOTAL, CKMB, CKMBINDEX, TROPONINI in the last 168 hours.  HbA1C: Hgb A1c MFr Bld  Date/Time Value Ref Range Status  07/14/2018 04:29 AM 5.8 (H) 4.8 - 5.6 % Final    Comment:    (NOTE) Pre diabetes:          5.7%-6.4% Diabetes:              >6.4% Glycemic control for   <7.0% adults with diabetes   07/13/2018 04:25 AM 6.2 (H) 4.8 - 5.6 % Final    Comment:    (NOTE) Pre diabetes:          5.7%-6.4% Diabetes:              >6.4% Glycemic control for   <7.0% adults with diabetes     CBG: Recent Labs  Lab 03/10/24 1535  03/10/24 1925 03/10/24 2355 03/11/24 0325 03/11/24 0727  GLUCAP 135* 131* 107* 117* 106*      DVT/GI PRX  assessed I Assessed the need for Labs I Assessed the need for Foley I Assessed the need for Central Venous Line Family Discussion when available I Assessed the need for Mobilization I made an Assessment of medications to be adjusted accordingly Safety Risk assessment completed  CASE DISCUSSED IN MULTIDISCIPLINARY ROUNDS WITH ICU TEAM   Critical care provider statement:   Total critical care time: 33 minutes   Performed by: Parris MD   Critical care time was exclusive of separately billable procedures and treating other patients.   Critical care was necessary to treat or prevent imminent or life-threatening deterioration.   Critical care was time spent personally by me on the following activities: development of treatment plan with patient and/or surrogate as well as nursing, discussions with consultants, evaluation of patient's response to treatment, examination of patient, obtaining history from patient or surrogate, ordering and performing treatments and interventions, ordering and review of laboratory studies, ordering and review of radiographic studies, pulse oximetry and re-evaluation of patient's condition.    Amrie Gurganus, M.D.  Pulmonary & Critical Care Medicine

## 2024-03-11 NOTE — Plan of Care (Signed)
  Problem: Clinical Measurements: Goal: Diagnostic test results will improve Outcome: Progressing Goal: Cardiovascular complication will be avoided Outcome: Progressing   Problem: Skin Integrity: Goal: Risk for impaired skin integrity will be minimized. Outcome: Progressing   Problem: Clinical Measurements: Goal: Respiratory complications will improve Outcome: Not Progressing

## 2024-03-11 NOTE — Consult Note (Signed)
 NEUROLOGY CONSULT NOTE   Date of service: March 11, 2024 Patient Name: Miguel [name] MRN:  [id number] DOB:  1950/09/24 Chief Complaint: Anoxic brain injury Requesting Provider: Parris Manna, MD  History of Present Illness  Miguel [name] is a 73 y.o. male with hx of PEA arrest x 2 with 15 minutes of downtime each time.  He has remained densely encephalopathic since admission.  EEG was attenuated without epileptiform discharges.  Due to concern for anoxic injury or stroke, an MRI was obtained 2 days ago which did not demonstrate any definite signs of injury.   Due to his continued encephalopathy, neurology has been consulted for comment on prognosis.  Past History   Past Medical History:  Diagnosis Date   Biventricular failure (HCC)    CVA (cerebral vascular accident) (HCC)    Polysubstance use disorder    Schizophrenia (HCC)     Past Surgical History:  Procedure Laterality Date   APPENDECTOMY     TRACHEOSTOMY TUBE PLACEMENT N/A 03/04/2024   Procedure: CREATION, TRACHEOSTOMY;  Surgeon: [name] Massie RAMAN, MD;  Location: [facility] OR[facility];  Service: ENT;  Laterality: N/A;    Family History: Family History  Family history unknown: Yes    Social History  reports that he has never smoked. He has never used smokeless tobacco. He reports that he does not drink alcohol and does not use drugs.  No Known Allergies  Medications   Current Facility-Administered Medications:    amiodarone  (PACERONE ) tablet 400 mg, 400 mg, Per Tube, Daily, Nelwyn Bristle, NP, 400 mg at 03/11/24 9066   ARIPiprazole  (ABILIFY ) tablet 30 mg, 30 mg, Per Tube, QHS, Kasa, Kurian, MD, 30 mg at 03/10/24 2132   bisacodyl  (DULCOLAX) suppository 10 mg, 10 mg, Rectal, Daily PRN, Benjamin, Alzora L, NP   Chlorhexidine  Gluconate Cloth 2 % PADS 6 each, 6 each, Topical, Daily, Assaker, Jean-Pierre, MD, 6 each at 03/10/24 2145   digoxin  (LANOXIN ) tablet 0.125 mg, 0.125 mg, Per NG tube, Daily, Assaker, Jean-Pierre, MD,  0.125 mg at 03/11/24 0933   docusate (COLACE) 50 MG/5ML liquid 100 mg, 100 mg, Per Tube, BID, Nelson, Dana G, NP, 100 mg at 03/11/24 0933   feeding supplement (OSMOLITE 1.5 CAL) liquid 1,000 mL, 1,000 mL, Per Tube, Continuous, Kasa, Kurian, MD, Last Rate: 60 mL/hr at 03/11/24 0701, Infusion Verify at 03/11/24 0701   feeding supplement (PROSource TF20) liquid 60 mL, 60 mL, Per Tube, BID, Kasa, Kurian, MD, 60 mL at 03/11/24 0932   fentaNYL  (SUBLIMAZE ) injection 50 mcg, 50 mcg, Intravenous, Q2H PRN, Ouma, Elizabeth Achieng, NP, 50 mcg at 03/04/24 0610   free water  100 mL, 100 mL, Per Tube, Q4H, Kasa, Kurian, MD, 100 mL at 03/11/24 0840   ipratropium-albuterol  (DUONEB) 0.5-2.5 (3) MG/3ML nebulizer solution 3 mL, 3 mL, Nebulization, Q6H PRN, Kasa, Kurian, MD   midazolam  (VERSED ) injection 1 mg, 1 mg, Intravenous, Q2H PRN, Ouma, Almarie Bake, NP, 1 mg at 03/10/24 0048   multivitamin with minerals tablet 1 tablet, 1 tablet, Per Tube, Daily, Dgayli, Belva, MD, 1 tablet at 03/11/24 0932   nutrition supplement (JUVEN) (JUVEN) powder packet 1 packet, 1 packet, Per Tube, BID BM, Aleskerov, Fuad, MD, 1 packet at 03/11/24 9061   Oral care mouth rinse, 15 mL, Mouth Rinse, Q2H, Ouma, Almarie Bake, NP, 15 mL at 03/11/24 9061   Oral care mouth rinse, 15 mL, Mouth Rinse, PRN, Kathrene Almarie Bake, NP   oxyCODONE  (Oxy IR/ROXICODONE ) immediate release tablet 5 mg, 5 mg, Per Tube, Q6H, Kasa,  Kurian, MD, 5 mg at 03/11/24 0518   pantoprazole  (PROTONIX ) injection 40 mg, 40 mg, Intravenous, Q12H, Keene, Jeremiah D, NP, 40 mg at 03/11/24 0932   polyethylene glycol (MIRALAX  / GLYCOLAX ) packet 17 g, 17 g, Per Tube, Daily PRN, Nelson, Dana G, NP   sodium chloride  flush (NS) 0.9 % injection 10-40 mL, 10-40 mL, Intracatheter, Q12H, Dgayli, Khabib, MD, 10 mL at 03/11/24 0938   sodium chloride  flush (NS) 0.9 % injection 10-40 mL, 10-40 mL, Intracatheter, PRN, Isadora, Khabib, MD, 10 mL at 03/06/24 2123   sodium chloride   flush (NS) 0.9 % injection 10-40 mL, 10-40 mL, Intracatheter, Q12H, Kasa, Kurian, MD, 10 mL at 03/11/24 9061   sodium chloride  flush (NS) 0.9 % injection 10-40 mL, 10-40 mL, Intracatheter, PRN, Kasa, Kurian, MD   thiamine  (VITAMIN B1) tablet 100 mg, 100 mg, Per Tube, Daily, Clair Rue B, RPH, 100 mg at 03/11/24 0932  Vitals   Vitals:   03/11/24 0800 03/11/24 0900 03/11/24 0933 03/11/24 1000  BP: 125/61 132/61  132/62  Pulse:   65   Resp: 14 16  14   Temp:      TempSrc:      SpO2:      Weight:      Height:        Body mass index is 28.08 kg/m.   Physical Exam   Constitutional: Appears well-developed and well-nourished.   Neurologic Examination    Neuro: Mental Status: Opens eyes to noxious stimulation, he briskly grimaces bilaterally. He does not follow appendicular commands, but I am able to get him to stick his tongue out to command twice. Cranial Nerves: II: He blinks to threat from the right but not left.  III,IV, VI: Eyes are midline and he does not look fully to either side V: VII: He blinks to eyelid stimulation bilaterally X: Cough is intact  Motor: He moves his upper extremities more than his lower extremities, he attempts to localize with bilateral upper extremities, flexes in the left lower extremity and minimal flicker of movement in the right lower extremity to noxious stimulation  sensory: As above  Cerebellar: Does not perform       Labs/Imaging/Neurodiagnostic studies   CBC:  Recent Labs  Lab 03/23/2024 0415 03/11/24 0515  WBC 11.9* 10.8*  HGB 8.3* 8.4*  HCT 27.1* 27.4*  MCV 103.4* 102.6*  PLT 598* 622*   Basic Metabolic Panel:  Lab Results  Component Value Date   NA 145 03/11/2024   K 4.3 03/11/2024   CO2 30 03/11/2024   GLUCOSE 118 (H) 03/11/2024   BUN 50 (H) 03/11/2024   CREATININE 0.91 03/11/2024   CALCIUM  7.9 (L) 03/11/2024   GFRNONAA >60 03/11/2024   GFRAA >60 07/12/2018   Lipid Panel:  Lab Results  Component Value  Date   LDLCALC 119 (H) 07/13/2018   HgbA1c:  Lab Results  Component Value Date   HGBA1C 5.8 (H) 07/14/2018   Urine Drug Screen:     Component Value Date/Time   LABOPIA NONE DETECTED 02/20/2024 0803   COCAINSCRNUR NONE DETECTED 02/20/2024 0803   LABBENZ NONE DETECTED 02/20/2024 0803   AMPHETMU NONE DETECTED 02/20/2024 0803   THCU NONE DETECTED 02/20/2024 0803   LABBARB NONE DETECTED 02/20/2024 0803    Alcohol Level     Component Value Date/Time   ETH <15 02/20/2024 1007   INR  Lab Results  Component Value Date   INR 1.4 (H) 03/04/2024   APTT  Lab Results  Component Value Date  APTT 41 (H) 02/20/2024   MRI Brain(Personally reviewed): No definite abnormality, I do wonder if there is slight edema in the cortex and a ribboning pattern, but this is by no means definite   ASSESSMENT   Miguel [name] is a 73 y.o. male with likely anoxic brain injury who has made some signs of improvement.  Given that we are now multiple weeks out, I suspect that any continued improvement will be prolonged and likely incomplete but that he does have some capacity for improvement from his current state.  He already has signs of return to consciousness, so I would favor support from a neurological perspective.  RECOMMENDATIONS  Continue supportive care per [facility] will be available on an as-needed basis, please call if further questions or concerns. ______________________________________________________________________    Bonney Aisha Seals, MD [facility]

## 2024-03-12 DIAGNOSIS — R0902 Hypoxemia: Secondary | ICD-10-CM | POA: Diagnosis not present

## 2024-03-12 DIAGNOSIS — I469 Cardiac arrest, cause unspecified: Secondary | ICD-10-CM | POA: Diagnosis present

## 2024-03-12 DIAGNOSIS — D75839 Thrombocytosis, unspecified: Secondary | ICD-10-CM | POA: Diagnosis not present

## 2024-03-12 DIAGNOSIS — Z8673 Personal history of transient ischemic attack (TIA), and cerebral infarction without residual deficits: Secondary | ICD-10-CM | POA: Diagnosis not present

## 2024-03-12 DIAGNOSIS — Z430 Encounter for attention to tracheostomy: Secondary | ICD-10-CM | POA: Diagnosis not present

## 2024-03-12 DIAGNOSIS — Z9911 Dependence on respirator [ventilator] status: Secondary | ICD-10-CM | POA: Diagnosis not present

## 2024-03-12 DIAGNOSIS — F329 Major depressive disorder, single episode, unspecified: Secondary | ICD-10-CM | POA: Diagnosis not present

## 2024-03-12 DIAGNOSIS — R404 Transient alteration of awareness: Secondary | ICD-10-CM | POA: Diagnosis not present

## 2024-03-12 DIAGNOSIS — Z8674 Personal history of sudden cardiac arrest: Secondary | ICD-10-CM | POA: Diagnosis not present

## 2024-03-12 DIAGNOSIS — G931 Anoxic brain damage, not elsewhere classified: Secondary | ICD-10-CM | POA: Diagnosis not present

## 2024-03-12 DIAGNOSIS — R001 Bradycardia, unspecified: Secondary | ICD-10-CM | POA: Diagnosis not present

## 2024-03-12 DIAGNOSIS — J690 Pneumonitis due to inhalation of food and vomit: Secondary | ICD-10-CM | POA: Diagnosis not present

## 2024-03-12 DIAGNOSIS — I5022 Chronic systolic (congestive) heart failure: Secondary | ICD-10-CM | POA: Diagnosis not present

## 2024-03-12 DIAGNOSIS — R7401 Elevation of levels of liver transaminase levels: Secondary | ICD-10-CM | POA: Diagnosis present

## 2024-03-12 DIAGNOSIS — J9621 Acute and chronic respiratory failure with hypoxia: Secondary | ICD-10-CM | POA: Diagnosis not present

## 2024-03-12 DIAGNOSIS — G894 Chronic pain syndrome: Secondary | ICD-10-CM | POA: Diagnosis not present

## 2024-03-12 DIAGNOSIS — I5020 Unspecified systolic (congestive) heart failure: Secondary | ICD-10-CM | POA: Diagnosis not present

## 2024-03-12 DIAGNOSIS — J189 Pneumonia, unspecified organism: Secondary | ICD-10-CM | POA: Diagnosis not present

## 2024-03-12 DIAGNOSIS — F209 Schizophrenia, unspecified: Secondary | ICD-10-CM | POA: Diagnosis not present

## 2024-03-12 DIAGNOSIS — Z7401 Bed confinement status: Secondary | ICD-10-CM | POA: Diagnosis not present

## 2024-03-12 DIAGNOSIS — I959 Hypotension, unspecified: Secondary | ICD-10-CM | POA: Diagnosis not present

## 2024-03-12 DIAGNOSIS — J9690 Respiratory failure, unspecified, unspecified whether with hypoxia or hypercapnia: Secondary | ICD-10-CM | POA: Diagnosis not present

## 2024-03-12 DIAGNOSIS — R7989 Other specified abnormal findings of blood chemistry: Secondary | ICD-10-CM | POA: Diagnosis present

## 2024-03-12 DIAGNOSIS — I5023 Acute on chronic systolic (congestive) heart failure: Secondary | ICD-10-CM | POA: Diagnosis not present

## 2024-03-12 DIAGNOSIS — G8918 Other acute postprocedural pain: Secondary | ICD-10-CM | POA: Diagnosis not present

## 2024-03-12 DIAGNOSIS — Z930 Tracheostomy status: Secondary | ICD-10-CM | POA: Diagnosis not present

## 2024-03-12 DIAGNOSIS — R6520 Severe sepsis without septic shock: Secondary | ICD-10-CM | POA: Diagnosis not present

## 2024-03-12 DIAGNOSIS — J90 Pleural effusion, not elsewhere classified: Secondary | ICD-10-CM | POA: Diagnosis not present

## 2024-03-12 DIAGNOSIS — R1312 Dysphagia, oropharyngeal phase: Secondary | ICD-10-CM | POA: Diagnosis not present

## 2024-03-12 DIAGNOSIS — D649 Anemia, unspecified: Secondary | ICD-10-CM | POA: Diagnosis not present

## 2024-03-12 LAB — CBC
HCT: 26 % — ABNORMAL LOW (ref 39.0–52.0)
Hemoglobin: 8 g/dL — ABNORMAL LOW (ref 13.0–17.0)
MCH: 31.9 pg (ref 26.0–34.0)
MCHC: 30.8 g/dL (ref 30.0–36.0)
MCV: 103.6 fL — ABNORMAL HIGH (ref 80.0–100.0)
Platelets: 640 K/uL — ABNORMAL HIGH (ref 150–400)
RBC: 2.51 MIL/uL — ABNORMAL LOW (ref 4.22–5.81)
RDW: 16.6 % — ABNORMAL HIGH (ref 11.5–15.5)
WBC: 9.6 K/uL (ref 4.0–10.5)
nRBC: 0.3 % — ABNORMAL HIGH (ref 0.0–0.2)

## 2024-03-12 LAB — RENAL FUNCTION PANEL
Albumin: 1.9 g/dL — ABNORMAL LOW (ref 3.5–5.0)
Anion gap: 4 — ABNORMAL LOW (ref 5–15)
BUN: 46 mg/dL — ABNORMAL HIGH (ref 8–23)
CO2: 31 mmol/L (ref 22–32)
Calcium: 8 mg/dL — ABNORMAL LOW (ref 8.9–10.3)
Chloride: 109 mmol/L (ref 98–111)
Creatinine, Ser: 0.84 mg/dL (ref 0.61–1.24)
GFR, Estimated: >60 mL/min (ref >60)
Glucose, Bld: 127 mg/dL — ABNORMAL HIGH (ref 70–99)
Phosphorus: 3 mg/dL (ref 2.5–4.6)
Potassium: 4.7 mmol/L (ref 3.5–5.1)
Sodium: 144 mmol/L (ref 135–145)

## 2024-03-12 LAB — GLUCOSE, CAPILLARY
Glucose-Capillary: 122 mg/dL — ABNORMAL HIGH (ref 70–99)
Glucose-Capillary: 62 mg/dL — ABNORMAL LOW (ref 70–99)
Glucose-Capillary: 121 mg/dL — ABNORMAL HIGH (ref 70–99)
Glucose-Capillary: 118 mg/dL — ABNORMAL HIGH (ref 70–99)
Glucose-Capillary: 111 mg/dL — ABNORMAL HIGH (ref 70–99)

## 2024-03-12 LAB — MAGNESIUM: Magnesium: 2.1 mg/dL (ref 1.7–2.4)

## 2024-03-12 MED ORDER — SODIUM CHLORIDE 0.9% FLUSH: 10.0000 mL | Freq: Two times a day (BID) | INTRAVENOUS | Status: DC

## 2024-03-12 MED ORDER — IPRATROPIUM-ALBUTEROL 0.5-2.5 (3) MG/3ML IN SOLN: 3.0000 mL | Freq: Four times a day (QID) | RESPIRATORY_TRACT | Status: DC | PRN

## 2024-03-12 MED ORDER — SODIUM CHLORIDE 0.9% FLUSH: 10.0000 mL | INTRAVENOUS | Status: DC | PRN

## 2024-03-12 MED ORDER — MIDAZOLAM HCL 2 MG/2ML IJ SOLN: 1.0000 mg | INTRAMUSCULAR | Status: DC | PRN

## 2024-03-12 MED ORDER — PANTOPRAZOLE SODIUM 40 MG IV SOLR: 40.0000 mg | Freq: Two times a day (BID) | INTRAVENOUS | Status: DC

## 2024-03-12 MED ORDER — OSMOLITE 1.5 CAL PO LIQD: 1000.0000 mL | ORAL | Status: DC

## 2024-03-12 MED ORDER — OXYCODONE HCL 5 MG PO TABS: 5.0000 mg | ORAL_TABLET | Freq: Four times a day (QID) | ORAL | Status: DC

## 2024-03-12 MED ORDER — AMIODARONE HCL 400 MG PO TABS: 400.0000 mg | ORAL_TABLET | Freq: Every day | ORAL | Status: AC

## 2024-03-12 MED ORDER — DOCUSATE SODIUM 50 MG/5ML PO LIQD
100.0000 mg | Freq: Two times a day (BID) | ORAL | Status: AC
Start: 2024-03-12 — End: ?

## 2024-03-12 MED ORDER — BISACODYL 10 MG RE SUPP
10.0000 mg | Freq: Once | RECTAL | Status: AC
  Administered 2024-03-12: 10 mg via RECTAL
  Filled 2024-03-12: qty 1

## 2024-03-12 MED ORDER — JUVEN PO PACK: 1.0000 | PACK | Freq: Two times a day (BID) | ORAL | Status: DC

## 2024-03-12 MED ORDER — ORAL CARE MOUTH RINSE: 15.0000 mL | OROMUCOSAL | Status: DC

## 2024-03-12 MED ORDER — CHLORHEXIDINE GLUCONATE CLOTH 2 % EX PADS: 6.0000 | MEDICATED_PAD | Freq: Every day | CUTANEOUS | Status: AC

## 2024-03-12 MED ORDER — ORAL CARE MOUTH RINSE: 15.0000 mL | OROMUCOSAL | Status: DC | PRN

## 2024-03-12 MED ORDER — BISACODYL 10 MG RE SUPP: 10.0000 mg | Freq: Every day | RECTAL | Status: AC | PRN

## 2024-03-12 MED ORDER — FREE WATER: 100.0000 mL | Status: DC

## 2024-03-12 MED ORDER — PROSOURCE TF20 ENFIT COMPATIBL EN LIQD: 60.0000 mL | Freq: Two times a day (BID) | ENTERAL | Status: DC

## 2024-03-12 MED ORDER — FENTANYL CITRATE PF 50 MCG/ML IJ SOSY: 50.0000 ug | PREFILLED_SYRINGE | INTRAMUSCULAR | Status: DC | PRN

## 2024-03-12 MED ORDER — VITAMIN B-1 100 MG PO TABS: 100.0000 mg | ORAL_TABLET | Freq: Every day | ORAL | Status: DC

## 2024-03-12 MED ORDER — ENOXAPARIN SODIUM 40 MG/0.4ML IJ SOSY: 40.0000 mg | PREFILLED_SYRINGE | INTRAMUSCULAR | Status: DC

## 2024-03-12 MED ORDER — ARIPIPRAZOLE 30 MG PO TABS: 30.0000 mg | ORAL_TABLET | Freq: Every day | ORAL | Status: AC

## 2024-03-12 MED ORDER — DIGOXIN 125 MCG PO TABS: 0.1250 mg | ORAL_TABLET | Freq: Every day | ORAL | Status: AC

## 2024-03-12 MED ORDER — POLYETHYLENE GLYCOL 3350 17 G PO PACK: 17.0000 g | PACK | Freq: Every day | ORAL | Status: DC | PRN

## 2024-03-12 MED ORDER — ADULT MULTIVITAMIN W/MINERALS CH: 1.0000 | ORAL_TABLET | Freq: Every day | ORAL | Status: DC

## 2024-03-12 NOTE — Progress Notes (Signed)
 PHARMACY CONSULT NOTE  Pharmacy Consult for Electrolyte Monitoring and Replacement   Recent Labs: Potassium (mmol/L)  Date Value  03/12/2024 4.7  09/15/2012 4.4   Magnesium  (mg/dL)  Date Value  92/82/7974 2.1   Calcium  (mg/dL)  Date Value  92/82/7974 8.0 (L)   Calcium , Total (mg/dL)  Date Value  98/79/7985 9.4   Albumin (g/dL)  Date Value  92/82/7974 1.9 (L)  09/15/2012 4.2   Phosphorus (mg/dL)  Date Value  92/82/7974 3.0   Sodium (mmol/L)  Date Value  03/12/2024 144  09/15/2012 146 (H)   Assessment: 73 y/o male with h/o CHF, dilated cardiomyopathy, substance abuse, schizophrenia, CVA, depression, HCV, homelessness and recently in jail who is admitted with PEA arrest, AKI, CHF with bilateral pleural effusions, thrombus, shock, H influenza and strep pneumoniae and sepsis. Pharmacy is asked to follow and replace electrolytes while in CCU  Nutrition: Osmolite 60 mL/hr + free water  flushes at 100 mL every 4 hours  Goal of Therapy:  Potassium 4.0 - 5.1 mmol/L Magnesium  2.0 - 2.4 mg/dL All Other Electrolytes WNL  Plan:  --no electrolyte replacement warranted for today --Will continue to monitor peripherally  Adriana [name] Bolster 03/12/2024 7:14 AM

## 2024-03-12 NOTE — Plan of Care (Signed)
  Problem: Education: Goal: Knowledge of General Education information will improve Description: Including pain rating scale, medication(s)/side effects and non-pharmacologic comfort measures Outcome: Not Progressing   Problem: Health Behavior/Discharge Planning: Goal: Ability to manage health-related needs will improve Outcome: Not Progressing   Problem: Clinical Measurements: Goal: Ability to maintain clinical measurements within normal limits will improve Outcome: Progressing Goal: Will remain free from infection Outcome: Progressing Goal: Diagnostic test results will improve Outcome: Progressing Goal: Respiratory complications will improve Outcome: Not Progressing Goal: Cardiovascular complication will be avoided Outcome: Progressing   Problem: Activity: Goal: Risk for activity intolerance will decrease Outcome: Progressing   Problem: Nutrition: Goal: Adequate nutrition will be maintained Outcome: Progressing   Problem: Coping: Goal: Level of anxiety will decrease Outcome: Progressing   Problem: Elimination: Goal: Will not experience complications related to bowel motility Outcome: Not Progressing Goal: Will not experience complications related to urinary retention Outcome: Progressing

## 2024-03-12 NOTE — Plan of Care (Signed)
 PMT shadowing.  Patient has been accepted to [facility] and is discharging now.  PMT will sign off.

## 2024-03-12 NOTE — Discharge Summary (Signed)
 Physician Discharge Summary  Patient ID: Miguel [name] MRN: [id number] DOB/AGE: August 22, 1951 73 y.o.  Admit date: 02/20/2024 Discharge date: 03/12/2024   Brief Pt Description / Synopsis:  73 y.o. male with PMHx of biventricular CHF and polysubstance abuse (crack cocaine) admitted following out-of-hospital Cardiac Arrest (15 minutes downtime) with Cardiogenic shock due to severe Biventricular CHF, sepsis due to H. Influenzae and Strep Pneumoniae Pneumonia, AKI, shock liver, and Acute Metabolic Encephalopathy. Now with ventilator dependent Respiratory Failure status post Tracheostomy.   Discharge Diagnoses:   Out-of-Hospital PEA Cardiac Arrest Multifactorial Shock: Cardiogenic + Septic Acute on Chronic Biventricular CHF Elevated Troponin Acute Hypoxic Respiratory Failure ~ progressed to Ventilator Dependent Respiratory Failure Bilateral pleural effusions right greater than left  H influenza and strep pneumoniae Pneumonia Sepsis  H. Influenza & Strep Pneumoniae Pneumonia   Acute Kidney Injury Shock Liver Anemia Acute Metabolic Encephalopathy                                                            Discharge Summary:  This is a 73 yo male who presented to [facility] ER via EMS from jail on 06/26 post cardiac arrest.  Per ER notes pts cellmate heard the pt gurgling this morning.  He was found pulseless with initial cardiac rhythm asystole.  EMS administered 3 doses of epi/2 mg of narcan .  Following administration of narcan  ROSC achieved.  Estimated downtime 15 minutes.  EMS reported en route to the ER pt became agitated and attempted to remove Igel.  EMS administered versed .  Pt became pulseless again en route ACLS protocol initiated.   ED Course  Upon arrival to the ER pt remained pulseless in PEA.  ACLS protocol continued pt received: 2 mg of epi/4 mg of iv narcan /1 amp of bicarb/1 g of calcium .  Igel exchanged for an ETT with ongoing CPR (pt did not require RSI medications for intubation).   ROSC achieved with estimated downtime during 2nd cardiac arrest 15 minutes.  CTA Chest negative for PE.  Significant lab results were: Na+ 129/K+ 5.3/chloride 90/glucose 110/BUN 49/creatinine 2.22/calcium  7.5/mag 3.2/alk phos 134/AST 3,301/ALT 2,038/total bilirubin 5.5/troponin 344/lactic acid 8.1/urine drug screen negative/UA concerning for possible UTI.  Sepsis protocol initiated pt to receive: cefepime /metronidazole /vancomycin .  Pt found to be severely hypoglycemic CBG 17 requiring 1 amp of D50W.  Levophed  gtt initiated due to hypotension.  PCCM team contacted for ICU admission.  Pt remains in police custody with bilateral ankle cuffs in place, and police officer at bedside.    PCCM asked to admit for further workup and treatment.   Please see Significant Hospital Events section below for full detailed hospital course.   Discharge Plan by Diagnosis:   #Cardiac arrest (PEA)  #Multifactorial Shock: Cardiogenic +septic + hypovolemic/hemorrhagic ~ RESOLVED #Acute on chronic Biventricular CHF #New onset Atrial Flutter ~ RESOLVED #Elevated troponin suspect secondary to demand ischemia vs NSTEMI #Possible mural thrombus ~ LOW SUSPICION  Hx: CVA with aphasia  -CTA Chest PE 02/20/24: No evidence of acute pulmonary embolism. Rounded filling defect within the right atrial appendage, suspicious for thrombus. -Echo 02/20/24: EF 20 to 25%, indeterminate diastolic parameters, possible mural thrombus in apical region, RV systolic function is severely reduced, RV size moderately enlarged -Repeat Echocardiogram 06/30: no definitive LV thrombus -Continuous cardiac monitoring -Maintain MAP >65 -HS Troponin peaked  at 2,551 -Diuresis as BP and renal function permits ~ currently euvovolemic -D/c Heparin  gtt on 7/3 due to anemia and lack of mural thrombus on repeat echo -Cardiology following, appreciate input ~ Per Cardiology pt is NOT a good candidate for advanced therapies such as ICD and LVAD due to  polysubstance abuse hx and medication noncompliance, nor is he a good candidate for Cardiac Catheterization given poor neurologic function and high risk for CV complications with general anesthesia -Continue PO Amiodarone  and Digoxin    #Acute Hypoxic Respiratory Failure ~ now VENTILATOR DEPENDENT RESPIRATORY FAILURE #Bilateral pleural effusions right greater than left  #H influenza and strep pneumoniae Pneumonia Hx: Tobacco abuse  Status post Tracheostomy on 7/10 -Full vent support, implement lung protective strategies -Plateau pressures less than 30 cm H20 -Wean FiO2 & PEEP as tolerated to maintain O2 sats >92% -Follow intermittent Chest X-ray & ABG as needed -Spontaneous Breathing Trials / Trach collar trials when respiratory parameters met and mental status permits -Implement VAP Bundle -Prn Bronchodilators -Completed ABX as above -Aggressive Pulmonary toilet as able, mobilize as able   #Sepsis ~ RESOLVED #H. Influenza & Strep Pneumoniae Pneumonia ~ TREATED -Monitor fever curve -Trend WBC's -Follow cultures as above -Completed ABX as above   #Acute Kidney Injury secondary to ATN ~ RESOLVED -Monitor I&O's / urinary output -Follow BMP -Ensure adequate renal perfusion -Avoid nephrotoxic agents as able -Replace electrolytes as indicated ~ Pharmacy following for assistance with electrolyte replacement   #Shock liver ~ RESOLVED #Elevated alk phos and total bilirubin  CT Abd/Pelvis 06/26: Mild diffuse mural thickening, which may be seen in the setting of hypoperfusion. Small volume free fluid. HCV ab 06/26: reactive  - Trend hepatic function panel & coags - Avoid hepatotoxic agents as able    #Anemia without signs of overt bleeding  -Trend CBC  -Monitor for s/sx of bleeding  -Lovenox  for DVT ppx (heparin  gtt d/c on 7/3 as no evidence of thrombus on repeat Limited Echo) -Transfuse fo hgb <7  -Continue PPI BID dosing   #Acute metabolic encephalopathy  #Concern for possible  anoxic brain injury Hx: Polysubstance abuse, depression, and schizoaffective disorder EEG 02/21/24: Study is suggestive of severe to profound diffuse encephalopathy. No seizures or epileptiform discharges were seen throughout the recording. MRI Brain 02/24/24: Since the previous MRI, the patient has developed chronic encephalomalacia changes within the right medial occipital lobe compatible with an interval PCA distribution infarct. Interval development of encephalomalacia in area of previously demonstrated left MCA distribution infarct, which was acute on the previous MRI. -UDS was negative -Treatment of metabolic derangements as outlined above -Provide supportive care -Promote normal sleep/wake cycle and family presence -Avoid sedating medications as able -Evaluated by Neurology, suspects any continued improvement with be prolonged and likely incomplete (but with some capacity for improvement from his current state)   Significant Events:  06/26: Pt admitted post cardiac arrest mechanically intubated with possible cardiogenic/possible septic shock with multiorgan failure requiring levophed  and dobutamine  gtts 06/27: on minimal sedation, discontinued this AM. Off nor-epi, continued on dobutamine  06/28: decreased dobutamine  to 2.5, remains off sedation 06/29: arouses to tactile and verbal stimuli, remains disoriented. Failed SBT this AM. Dobutamine  increased to 5 mcg overnight due to bradycardia 06/30: Pt remains mechanically intubated, however neuro exam precludes extubation.  He has not received sedation since 06/27. Dobutamine  discontinued  06/30: MRI Brain Since the previous MRI, the patient has developed chronic encephalomalacia changes within the right medial occipital lobe compatible with an interval PCA distribution infarct. Interval development  of encephalomalacia in area of previously demonstrated left MCA distribution infarct, which was acute on the previous MRI. 07/01: Overnight pt  remained in atrial flutter with rvr hr 140 to 150's with possible ST elevation despite iv metoprolol .  Cardiologist Dr Darron notified recommended amiodarone  bolus followed by amiodarone  gtt for rate control.  However, this morning while on amiodarone  gtt pt became severely bradycardic hr as low as 27 bpm with associated hypotension, amiodarone  gtt discontinued he is now requiring low dose levophed  gtt.  Self extubated.  Delirious requiring Precedex . 07/02: No significant events noted overnight.  Remains on Precedex  due to delirium, currently protecting his airway.  Afebrile, hemodynamically stable, Levophed  weaned off, Bradycardia resolved, no reports of tachyarrhythmias.  Dobutamine  weaned to 2.5 and Coox remains stable at 83, will d/c Dobutamine .  Speech/PT/OT consulted.  Failed speech evaluation, plan to place Dobhoff as able, remove foley. 07/03: No significant events noted overnight. Remains on low dose precedex  (0.4 mcg) due to delirium.  Afebrile, hemodynamically stable, no vasopressors.  Coox 73 and Lactic 1.1 OFF of inotropes and OFF Levophed .  Dobhoff with difficultly functioning, will exchange it to a regular NGT for feeds and med administration.  Gentle diuresis with 20 mg IV Lasix  x1 dose.  Hgb decreased to 9 from 11.7, no signs of bleeding, will d/c Heparin  gtt given no thrombus on repeat Echo. 07/04: Overnight Levophed  reinitiated, Hgb dropped to 6.4 (no reports of bleeding from nursing, abdominal exam is benign), ordered for 1 unit pRBC's, Lovenox  d/c,  increase PPI to BID dosing.  With worsening Leukocytosis up to 15.5 from 12.8 and increased FiO2 requirements to up 10 L (overnight deep Nasotracheal suctioning yielded copious amounts of thick secretions) , will check PCT and start empiric Zosyn  for aspiration coverage.  WOB is improved from yesterday, on low dose Precedex  but more interactive today.  Again unable to complete speech evaluation, will attempt NG tube placement again.  07/05:  Remains confused and delirious. HR in the 140s; unresponsive to amiodarone  bolus and infusion. Transfused 1 unit of PRBCs for Hg of 7.1. Started on valium  for agitation. 07/06: Intubated overnight due to worsening mental status and thick copious secretions 07/09: Pt remains mechanically intubated pending trach placement  7/9 s/p TRACH family updated 7/10-7/11 off sedation, remains obtunded, failed weaning trial 7/12 failed vent weaning trial    03/09/24- patient remains off sedation x 24h.  Still with GCS5T concern for CVA post cardiac arrest. Plan for MRI brain today at 2pm.  CM working on insurance due to prisoner status.   03/10/24- Patient had no abnormalities noted on MRI brain.  Patient is working on potential transfer to [facility].  03/11/24- patient remains severely encephalopathic.  Vitals stable with no arrythmias overnight. Trache exchanged yesterday.  Tube feeds ongoing. Appeal in process for LTACH. We will work on Dobhoff in the interim.  03/12/24-  Unchanged clinically overnight, has not had BM for few days despite nourishment. He's on 28% via trache with no respiratory distress, mental status is slowly improved.  Neurologist feels that he may recover with time. Discharge to [facility].  Significant Diagnostic Studies:  6/26: CT Head wo contrast>>IMPRESSION: 1. There is of focal area of abnormal density within the left frontal lobe, which could represent a subacute cortical infarct. Correlation with MRI of the brain is suggested. 2. Interval development of a focal area of diminished attenuation within the subcortical white matter of the right occipital lobe, possibly representing a chronic subcortical infarct. This also could be  better assessed with MRI. 6/26: CTa Chest PE & CT Abdomen/Pelvis>>IMPRESSION: CTA CHEST: 1. No evidence of acute pulmonary embolism. 2. Rounded filling defect within the right atrial appendage, suspicious for thrombus. 3. Multi lobar pulmonary findings,  likely a combination of atelectasis, pulmonary edema, and aspiration. 4. Moderate right and small left pleural effusions. 5. Reflux of contrast material into the hepatic veins, suggesting a degree of right heart dysfunction. 6. Multiple bilateral anterior rib fractures. CT ABDOMEN AND PELVIS: 1. Mild diffuse mural thickening, which may be seen in the setting of hypoperfusion. 2. Small volume free fluid. 3.  Aortic Atherosclerosis (ICD10-I70.0).   6/30: MRI Brain>>IMPRESSION: 1. Since the previous MRI, the patient has developed chronic encephalomalacia changes within the right medial occipital lobe compatible with an interval PCA distribution infarct. 2. Interval development of encephalomalacia in area of previously demonstrated left MCA distribution infarct, which was acute on the previous MRI. 7/6: CT Chest wo Contrast>>IMPRESSION: 1. Improved aeration of the lungs with persistent likely passive atelectasis of the bilateral upper lobes. 2. Stable small to moderate right and interval increased in size small to moderate left pleural effusions. 3. Redemonstration of acute to subacute bilateral anterior 3-6 rib fractures. 4. Redemonstration of acute subacute nondisplaced inferior sternal body fracture. 5. Cardiomegaly. 6. Findings suggestive of anemia. 7. Trace to small volume ascites. 7/7: CT Abdomen & Pelvis wo Contrast>>MPRESSION: 1. Incidental partial visualization of a large mixed density left-sided retroperitoneal fluid collection exerting partial mass effect on the left kidney and continuing into the left pelvis where inferior extent is not completely visualized. Maximum transverse diameter is approximately 13 cm. Given high density and low-density components, this most likely represents a left-sided retroperitoneal hemorrhage. 2. Bilateral small pleural effusions and dense bilateral lower lobe atelectasis. 3. A high stomach is completely posterior to the transverse  colon and was in a similar position by prior CT of the chest and abdominal x-rays. There is no window for safe percutaneous gastrostomy tube placement and the patient is not a candidate for percutaneous gastrostomy. Showed long-term gastrostomy or jejunostomy feeding be necessary, recommend general surgical consultation. 4. Body wall anasarca. 5. Aortic atherosclerosis. 7/14: MRI Brain>>IMPRESSION: 1. No evidence of acute intracranial abnormality. 2. Remote left frontal and right occipital infarcts.            Micro Data:  MRSA PCR 06/26>>negative  COVID/Influenza A&B/RSV 06/26>>negative  RVP 06/26>>negative  Blood 06/26>>NGTD  Tracheal aspirate 06/26>>H influenza and strep pneumoniae  Urine 06/26>>less than 10,000 colonies/ml insignificant growth  Legionella pneum ur ag 06/26>>negative Tracheal aspirate 7/6>> normal respiratory flora Tracheal aspirate 7/13>>MODERATE CORYNEBACTERIUM MINUTISSIMUM    Antimicrobials:   Anti-infectives (From admission, onward)    Start     Dose/Rate Route Frequency Ordered Stop   03/05/24 1200  fluconazole  (DIFLUCAN ) tablet 200 mg        200 mg Per Tube Daily 03/05/24 0834 03/11/24 0932   03/04/24 2200  micafungin  (MYCAMINE ) 100 mg in sodium chloride  0.9 % 100 mL IVPB  Status:  Discontinued        100 mg 105 mL/hr over 1 Hours Intravenous Daily 03/04/24 1936 03/05/24 0834   03/03/24 0600  ceFAZolin  (ANCEF ) IVPB 2g/100 mL premix        2 g 200 mL/hr over 30 Minutes Intravenous On call to O.R. 03/02/24 1432 03/04/24 0559   02/28/24 1000  piperacillin -tazobactam (ZOSYN ) IVPB 3.375 g  Status:  Discontinued        3.375 g 12.5 mL/hr over 240 Minutes Intravenous Every 8  hours 02/28/24 0815 03/03/24 1021   02/22/24 1100  cefTRIAXone  (ROCEPHIN ) 2 g in sodium chloride  0.9 % 100 mL IVPB        2 g 200 mL/hr over 30 Minutes Intravenous Every 24 hours 02/22/24 0952 02/27/24 1134   02/22/24 1100  azithromycin  (ZITHROMAX ) 500 mg in sodium chloride  0.9 % 250  mL IVPB  Status:  Discontinued        500 mg 250 mL/hr over 60 Minutes Intravenous Every 24 hours 02/22/24 0952 02/24/24 1009   02/21/24 0000  ceFEPIme  (MAXIPIME ) 2 g in sodium chloride  0.9 % 100 mL IVPB  Status:  Discontinued        2 g 200 mL/hr over 30 Minutes Intravenous Every 12 hours 02/20/24 1051 02/22/24 0952   02/20/24 1200  vancomycin  (VANCOREADY) IVPB 2000 mg/400 mL        2,000 mg 200 mL/hr over 120 Minutes Intravenous  Once 02/20/24 1007 02/20/24 1400   02/20/24 1050  vancomycin  variable dose per unstable renal function (pharmacist dosing)  Status:  Discontinued         Does not apply See admin instructions 02/20/24 1051 02/21/24 0925   02/20/24 0845  ceFEPIme  (MAXIPIME ) 2 g in sodium chloride  0.9 % 100 mL IVPB        2 g 200 mL/hr over 30 Minutes Intravenous  Once 02/20/24 0838 02/20/24 1410   02/20/24 0845  metroNIDAZOLE  (FLAGYL ) IVPB 500 mg        500 mg 100 mL/hr over 60 Minutes Intravenous  Once 02/20/24 0838 02/20/24 1559   02/20/24 0845  vancomycin  (VANCOCIN ) IVPB 1000 mg/200 mL premix  Status:  Discontinued        1,000 mg 200 mL/hr over 60 Minutes Intravenous  Once 02/20/24 9161 02/20/24 1007        Consults:  PCCM Cardiology Palliative Care ENT   Discharge Exam:  Constitutional: acutely ill, on vent via Tracheostomy, in NAD HEENT:  [place]/AT, PERRLA, tracheostomy clean dry and intact, no JVD Cardiovascular: Regular rate and rhythm, S1/S2, no MRG Pulmonary/Chest: Bilateral lung expansion, no vent dyssynchrony, mechanical breath sounds bilaterally; no wheezes; breath sound diminished in the bases. Abdominal: soft, nontender, nondistended, no guarding or rebound tenderness, hypoactive bowel sounds, no organomegaly on palpation Musculoskeletal: +rom, no deformities Neurological: Awakens and withdraws to noxious stimulus; intermittently followings simple commands, pupils PERRL Extremities: hyperpigmented with faint pulses Skin: sternal wound with intact  dressing  Vitals:   03/12/24 0723 03/12/24 0800 03/12/24 0900 03/12/24 1103  BP:  (!) 124/57 128/60   Pulse:      Resp:  14 14   Temp:   98 F (36.7 C)   TempSrc:   Oral   SpO2: 96%   96%  Weight:      Height:         Discharge Labs:   BMET Recent Labs  Lab 03/06/24 0415 03/07/24 0500 03/08/24 0343 03/09/24 0350 03/10/24 0415 03/11/24 0515 03/12/24 0513  NA 144   < > 142 143 144 145 144  K 3.9   < > 4.3 4.6 4.9 4.3 4.7  CL 107   < > 107 109 107 110 109  CO2 29   < > 30 29 32 30 31  GLUCOSE 117*   < > 159* 131* 105* 118* 127*  BUN 48*   < > 46* 43* 43* 50* 46*  CREATININE 1.13   < > 1.07 0.72 0.96 0.91 0.84  CALCIUM  7.6*   < > 7.8* 7.9*  7.8* 7.9* 8.0*  MG 2.2   < > 2.1 2.3 2.2 2.2 2.1  PHOS 2.8  --   --   --   --  2.6 3.0   < > = values in this interval not displayed.    CBC Recent Labs  Lab 03/10/24 0415 03/11/24 0515 03/12/24 0513  HGB 8.3* 8.4* 8.0*  HCT 27.1* 27.4* 26.0*  WBC 11.9* 10.8* 9.6  PLT 598* 622* 640*    Anti-Coagulation No results for input(s): INR in the last 168 hours.        Allergies as of 03/12/2024   No Known Allergies      Medication List     STOP taking these medications    [place] 882 MG/3.2ML prefilled syringe Generic drug: ARIPiprazole  Lauroxil ER   atorvastatin  40 MG tablet Commonly known as: LIPITOR   furosemide  40 MG tablet Commonly known as: Lasix    losartan 25 MG tablet Commonly known as: COZAAR       TAKE these medications    amiodarone  400 MG tablet Commonly known as: PACERONE  Place 1 tablet (400 mg total) into feeding tube daily. Start taking on: March 13, 2024   ARIPiprazole  30 MG tablet Commonly known as: ABILIFY  Place 1 tablet (30 mg total) into feeding tube at bedtime.   bisacodyl  10 MG suppository Commonly known as: DULCOLAX Place 1 suppository (10 mg total) rectally daily as needed for moderate constipation or severe constipation.   Chlorhexidine  Gluconate Cloth 2 %  Pads Apply 6 each topically daily.   digoxin  0.125 MG tablet Commonly known as: LANOXIN  1 tablet (0.125 mg total) by Per NG tube route daily. Start taking on: March 13, 2024   docusate 50 MG/5ML liquid Commonly known as: COLACE Place 10 mLs (100 mg total) into feeding tube 2 (two) times daily.   enoxaparin  40 MG/0.4ML injection Commonly known as: LOVENOX  Inject 0.4 mLs (40 mg total) into the skin daily.   feeding supplement (OSMOLITE 1.5 CAL) Liqd Place 1,000 mLs into feeding tube continuous.   nutrition supplement (JUVEN) Pack Place 1 packet into feeding tube 2 (two) times daily between meals.   feeding supplement (PROSource TF20) liquid Place 60 mLs into feeding tube 2 (two) times daily.   fentaNYL  50 MCG/ML injection Commonly known as: SUBLIMAZE  Inject 1 mL (50 mcg total) into the vein every 2 (two) hours as needed for severe pain (pain score 7-10).   free water  Soln Place 100 mLs into feeding tube every 4 (four) hours.   ipratropium-albuterol  0.5-2.5 (3) MG/3ML Soln Commonly known as: DUONEB Take 3 mLs by nebulization every 6 (six) hours as needed.   midazolam  2 MG/2ML Soln injection Commonly known as: VERSED  Inject 1 mL (1 mg total) into the vein every 2 (two) hours as needed for sedation or agitation.   mouth rinse Liqd solution 15 mLs by Mouth Rinse route every 2 (two) hours.   mouth rinse Liqd solution 15 mLs by Mouth Rinse route as needed (oral care).   multivitamin with minerals Tabs tablet Place 1 tablet into feeding tube daily. Start taking on: March 13, 2024   oxyCODONE  5 MG immediate release tablet Commonly known as: Oxy IR/ROXICODONE  Place 1 tablet (5 mg total) into feeding tube every 6 (six) hours.   pantoprazole  40 MG injection Commonly known as: PROTONIX  Inject 40 mg into the vein every 12 (twelve) hours.   polyethylene glycol 17 g packet Commonly known as: MIRALAX  / GLYCOLAX  Place 17 g into feeding tube daily as needed  for moderate  constipation.   sodium chloride  flush 0.9 % Soln Commonly known as: NS 10-40 mLs by Intracatheter route every 12 (twelve) hours.   sodium chloride  flush 0.9 % Soln Commonly known as: NS 10-40 mLs by Intracatheter route as needed (flush).   sodium chloride  flush 0.9 % Soln Commonly known as: NS 10-40 mLs by Intracatheter route every 12 (twelve) hours.   sodium chloride  flush 0.9 % Soln Commonly known as: NS 10-40 mLs by Intracatheter route as needed (flush).   thiamine  100 MG tablet Commonly known as: Vitamin B-1 Place 1 tablet (100 mg total) into feeding tube daily. Start taking on: March 13, 2024            Disposition: LTACH  Discharged Condition: Miguel [name] has met maximum benefit of inpatient care and is medically stable and cleared for discharge to [facility].  Patient is pending follow up as above.      Time spent on disposition:  50 Minutes.     Signed: Inge Lecher, AGACNP-BC [facility] Prefer epic messenger for cross cover needs If after hours, please call E-link

## 2024-03-12 NOTE — TOC Transition Note (Addendum)
 Transition of Care [facility]) - Discharge Note   Patient Details  Name: Miguel [name] MRN: [id number] Date of Birth: December 08, 1950  Transition of Care [facility]) CM/SW Contact:  [place] L Carol Theys, LCSW Phone Number: 03/12/2024, 12:52 PM   Clinical Narrative:     CSW was notified by [facility] that patients appeal was approved and a bed is ready for him. Patient is able to transfer. Medical team in ICU was notified and they were provided with the room number and number to call in a report. RN on ICU floor will schedule transport with [facility].   Family has been notified. CSW spoke with patients sister.   No other TOC needs observed. TOC signing off.   Final next level of care: Long Term Acute Care (LTAC) Barriers to Discharge: No Barriers Identified   Patient Goals and CMS Choice     Choice offered to / list presented to : Patient      Discharge Placement              Patient chooses bed at: [facility] Patient to be transferred to facility by: [facility] Name of family member notified: Addie Ingraham Patient and family notified of of transfer: 03/12/24  Discharge Plan and Services Additional resources added to the After Visit Summary for                                       Social Drivers of Health (SDOH) Interventions SDOH Screenings   Food Insecurity: Patient Unable To Answer (02/21/2024)  Housing: Patient Unable To Answer (02/21/2024)  Transportation Needs: Patient Unable To Answer (02/21/2024)  Utilities: Patient Unable To Answer (02/21/2024)  Social Connections: Patient Unable To Answer (02/21/2024)  Tobacco Use: Low Risk  (03/04/2024)     Readmission Risk Interventions     No data to display

## 2024-03-12 NOTE — Consult Note (Signed)
 WOC Nurse Consult Note:  Reason for Consult: sacral wound  Wound type: Stage 2 Pressure Injury sacrum/coccyx red moist with sloughing of epidermis buttocks likely r/t moisture and friction pink  Pressure Injury POA: no  Measurement: see nursing flowsheet  Wound bed: as above  Drainage (amount, consistency, odor) see nursing flowsheet  Periwound: darkened skin with lifting epidermis  Dressing procedure/placement/frequency:  Cleanse buttocks/sacrum/coccyx with Vashe wound cleanser Soila [phone]) do not rinse and allow to air dry.  Apply Xeroform gauze (Lawson [phone]) to open wound beds daily and secure with silicone foam or ABD pad and tape whichever is preferred.   POC discussed with bedside nurse. WOC team will not follow. Re-consult if further needs arise.   Thank you,    Karita Dralle MSN, RN-BC, [employer]

## 2024-03-12 NOTE — Progress Notes (Signed)
 NAME:  Miguel [name], MRN:  [id number], DOB:  April 30, 1951, LOS: 21 ADMISSION DATE:  02/20/2024, CONSULTATION DATE: 02/20/2024 REFERRING MD: Dr. Levander,  CHIEF COMPLAINT: Cardiac Arrest    History of Present Illness:  73 yo male who [facility] ER [facility] EMS from jail on 06/26 post cardiac arrest.  Per ER notes pts cellmate heard the pt gurgling this morning.  He was found pulseless with initial cardiac rhythm asystole.  EMS administered 3 doses of epi/2 mg of narcan .  Following administration of narcan  ROSC achieved.  Estimated downtime 15 minutes.  EMS reported en route to the ER pt became agitated and attempted to remove Igel.  EMS administered versed .  Pt became pulseless again en route ACLS protocol initiated.  ED Course  Upon arrival to the ER pt remained pulseless in PEA.  ACLS protocol continued pt received: 2 mg of epi/4 mg of iv narcan /1 amp of bicarb/1 g of calcium .  Igel exchanged for an ETT with ongoing CPR (pt did not require RSI medications for intubation).  ROSC achieved with estimated downtime during 2nd cardiac arrest 15 minutes.  CTA Chest negative for PE.  Significant lab results were: Na+ 129/K+ 5.3/chloride 90/glucose 110/BUN 49/creatinine 2.22/calcium  7.5/mag 3.2/alk phos 134/AST 3,301/ALT 2,038/total bilirubin 5.5/troponin 344/lactic acid 8.1/urine drug screen negative/UA concerning for possible UTI.  Sepsis protocol initiated pt to receive: cefepime /metronidazole /vancomycin .  Pt found to be severely hypoglycemic CBG 17 requiring 1 amp of D50W.  Levophed  gtt initiated due to hypotension.  PCCM team contacted for ICU admission.  Pt remains in police custody with bilateral ankle cuffs in place, and police officer at bedside.    03/09/24- patient remains off sedation x 24h.  Still with GCS5T concern for CVA post cardiac arrest. Plan for MRI brain today at 2pm.  CM working on insurance due to prisoner status.   03/10/24- Patient had no abnormalities noted on MRI brain.  Patient is  working on potential transfer to [facility].  03/11/24- patient remains severely encephalopathic.  Vitals stable with no arrythmias overnight. Trache exchanged yesterday.  Tube feeds ongoing. Appeal in process for LTACH. We will work on Dobhoff in the interim.  03/12/24-  Unchanged clinically overnight, has not had BM for few days despite nourishment. He's on 28% via trache with no respiratory distress, mental status is slowly improved.  Neurologist feels that he may recover with time.   Pertinent  Medical History  Schizophrenia  Depression Current Smoker  Chronic Systolic CHF (Echo 09/11/22: EF <20%, grade II diastolic dysfunction, trivial pericardial effusion, mild mitral valve regurgitation, trivial tricuspid valve regurgitation, trivial pulmonic valve regurgitation) CVA with aphasia  Micro Data:   MRSA PCR 06/26>>negative  COVID/Influenza A&B/RSV 06/26>>negative  RVP 06/26>>negative  Blood 06/26>>NGTD  Tracheal aspirate 06/26>>H influenza and strep pneumoniae  Urine 06/26>>less than 10,000 colonies/ml insignificant growth  Legionella pneum ur ag 06/26>>negative   Anti-infectives (From admission, onward)    Start     Dose/Rate Route Frequency Ordered Stop   03/05/24 1200  fluconazole  (DIFLUCAN ) tablet 200 mg        200 mg Per Tube Daily 03/05/24 0834 03/11/24 0932   03/04/24 2200  micafungin  (MYCAMINE ) 100 mg in sodium chloride  0.9 % 100 mL IVPB  Status:  Discontinued        100 mg 105 mL/hr over 1 Hours Intravenous Daily 03/04/24 1936 03/05/24 0834   03/03/24 0600  ceFAZolin  (ANCEF ) IVPB 2g/100 mL premix        2 g 200 mL/hr over  30 Minutes Intravenous On call to O.R. 03/02/24 1432 03/04/24 0559   02/28/24 1000  piperacillin -tazobactam (ZOSYN ) IVPB 3.375 g  Status:  Discontinued        3.375 g 12.5 mL/hr over 240 Minutes Intravenous Every 8 hours 02/28/24 0815 03/03/24 1021   02/22/24 1100  cefTRIAXone  (ROCEPHIN ) 2 g in sodium chloride  0.9 % 100 mL IVPB        2 g 200 mL/hr over 30  Minutes Intravenous Every 24 hours 02/22/24 0952 02/27/24 1134   02/22/24 1100  azithromycin  (ZITHROMAX ) 500 mg in sodium chloride  0.9 % 250 mL IVPB  Status:  Discontinued        500 mg 250 mL/hr over 60 Minutes Intravenous Every 24 hours 02/22/24 0952 02/24/24 1009   02/21/24 0000  ceFEPIme  (MAXIPIME ) 2 g in sodium chloride  0.9 % 100 mL IVPB  Status:  Discontinued        2 g 200 mL/hr over 30 Minutes Intravenous Every 12 hours 02/20/24 1051 02/22/24 0952   02/20/24 1200  vancomycin  (VANCOREADY) IVPB 2000 mg/400 mL        2,000 mg 200 mL/hr over 120 Minutes Intravenous  Once 02/20/24 1007 02/20/24 1400   02/20/24 1050  vancomycin  variable dose per unstable renal function (pharmacist dosing)  Status:  Discontinued         Does not apply See admin instructions 02/20/24 1051 02/21/24 0925   02/20/24 0845  ceFEPIme  (MAXIPIME ) 2 g in sodium chloride  0.9 % 100 mL IVPB        2 g 200 mL/hr over 30 Minutes Intravenous  Once 02/20/24 0838 02/20/24 1410   02/20/24 0845  metroNIDAZOLE  (FLAGYL ) IVPB 500 mg        500 mg 100 mL/hr over 60 Minutes Intravenous  Once 02/20/24 0838 02/20/24 1559   02/20/24 0845  vancomycin  (VANCOCIN ) IVPB 1000 mg/200 mL premix  Status:  Discontinued        1,000 mg 200 mL/hr over 60 Minutes Intravenous  Once 02/20/24 0838 02/20/24 1007      Significant Hospital Events: Including procedures, antibiotic start and stop dates in addition to other pertinent events   06/26: Pt admitted post cardiac arrest mechanically intubated with possible cardiogenic/possible septic shock with multiorgan failure requiring levophed  and dobutamine  gtts 06/27: on minimal sedation, discontinued this AM. Off nor-epi, continued on dobutamine  06/28: decreased dobutamine  to 2.5, remains off sedation 06/29: arouses to tactile and verbal stimuli, remains disoriented. Failed SBT this AM. Dobutamine  increased to 5 mcg overnight due to bradycardia 06/30: Pt remains mechanically intubated, however neuro  exam precludes extubation.  He has not received sedation since 06/27. Dobutamine  discontinued  06/30: MRI Brain Since the previous MRI, the patient has developed chronic           encephalomalacia changes within the right medial occipital lobe compatible with an        interval  PCA distribution infarct. Interval development of encephalomalacia in area of        previously demonstrated left MCA distribution infarct, which was acute on the        previous MRI. 07/01: Overnight pt remained in atrial flutter with rvr hr 140 to 150's with possible ST elevation despite iv metoprolol .  Cardiologist Dr Darron notified recommended amiodarone  bolus followed by amiodarone  gtt for rate control.  However, this morning while on amiodarone  gtt pt became severely bradycardic hr as low as 27 bpm with associated hypotension, amiodarone  gtt discontinued he is now requiring low dose levophed   gtt. Self extubated respiratory status stable but remained delirious requiring precedex  gtt  07/02: No significant events noted overnight.  Remains on Precedex  due to delirium, currently protecting his airway.  Afebrile, hemodynamically stable, Levophed  weaned off, Bradycardia resolved, no reports of tachyarrhythmias.  Dobutamine  weaned to 2.5 and Coox remains stable at 83, will d/c Dobutamine .  Speech/PT/OT consulted.  Failed speech evaluation, plan to place Dobhoff as able, remove foley. 07/03: No significant events noted overnight. Remains on low dose precedex  (0.4 mcg) due to delirium.  Afebrile, hemodynamically stable, no vasopressors.  Coox 73 and Lactic 1.1 OFF of inotropes and OFF Levophed .  Dobhoff with difficultly functioning, will exchange it to a regular NGT for feeds and med administration.  Gentle diuresis with 20 mg IV Lasix  x1 dose.  Hgb decreased to 9 from 11.7, no signs of bleeding, will d/c Heparin  gtt given no thrombus on repeat Echo. 07/04: Overnight Levophed  reinitiated, Hgb dropped to 6.4 (no reports of bleeding from  nursing, abdominal exam is benign), ordered for 1 unit pRBC's, Lovenox  d/c,  increase PPI to BID dosing.  With worsening Leukocytosis up to 15.5 from 12.8 and increased FiO2 requirements to up 10 L (overnight deep Nasotracheal suctioning yielded copious amounts of thick secretions) , will check PCT and start empiric Zosyn  for aspiration coverage.  WOB is improved from yesterday, on low dose Precedex  but more interactive today.  Again unable to complete speech evaluation, will attempt NG tube placement again. 07/05: Remains confused and delirious. HR in the 140s; unresponsive to amiodarone  bolus and infusion. Transfused 1 unit of PRBCs for Hg of 7.1. Started on valium  for agitation. 07/06: Intubated overnight due to worsening mental status and thick copious secretions 07/09: Pt remains mechanically intubated pending trach placement  7/9 s/p TRACH family updated 7/10-7/11 off sedation, remains obtunded 7/12 failed vent weaning trial  Interim History / Subjective:   VENT WEANING ATTEMPT  7/10 PATIENT UNABLE TO WEAN FROM VENT 7/13 patient with increased WOB with weaning trial     Remains critically ill Remains s/p trach Requires VENT support for survival  Vent Mode: PSV;CPAP FiO2 (%):  [28 %] 28 % PEEP:  [5 cmH20] 5 cmH20 Pressure Support:  [8 cmH20-10 cmH20] 8 cmH20   Objective    Blood pressure (!) 125/58, pulse (!) 55, temperature 98.2 F (36.8 C), temperature source Axillary, resp. rate 12, height 6' (1.829 m), weight 92.5 kg, SpO2 96%.    Vent Mode: PSV;CPAP FiO2 (%):  [28 %] 28 % PEEP:  [5 cmH20] 5 cmH20 Pressure Support:  [8 cmH20-10 cmH20] 8 cmH20   Intake/Output Summary (Last 24 hours) at 03/12/2024 0825 Last data filed at 03/12/2024 0809 Gross per 24 hour  Intake 2159 ml  Output 1003 ml  Net 1156 ml   Filed Weights   03/10/24 0400 03/11/24 0358 03/12/24 0157  Weight: 91.7 kg 93.9 kg 92.5 kg      REVIEW OF SYSTEMS  PATIENT IS UNABLE TO PROVIDE COMPLETE REVIEW  OF SYSTEMS DUE TO SEVERE CRITICAL ILLNESS   PHYSICAL EXAMINATION:  GENERAL:critically ill appearing, +resp distress EYES: Pupils equal, round, reactive to light.  No scleral icterus.  MOUTH: Moist mucosal membrane. S/p trach NECK: Supple.  PULMONARY: Lungs clear to auscultation, +rhonchi, +wheezing CARDIOVASCULAR: S1 and S2.  Regular rate and rhythm GASTROINTESTINAL: Soft, nontender, -distended. Positive bowel sounds.  MUSCULOSKELETAL: No swelling, clubbing, or edema.  NEUROLOGIC: sedated SKIN:normal, warm to touch, Capillary refill delayed  Pulses present bilaterally   Resolved problem list  Hyponatremia  Lactic acidosis  Hyperkalemia   Assessment and Plan   73 yo AAM admitted for Cardiac arrest (PEA) Cardiogenic/possible septic shock H INFL and STREP pneumonia with Acute on chronic systolic CHF Elevated troponin suspect secondary to demand ischemia,Possible mural thrombus,Atrial flutter with rvr ,Hx: CVA with aphasia, failure to wean from vent and s/p TRACH, patient with end stage schizophrenia   Severe ACUTE Hypoxic and Hypercapnic Respiratory Failure - Ensure adequate pulmonary hygiene  CTA Chest PE 02/20/24: No evidence of acute pulmonary embolism. Rounded filling defect within the right atrial appendage, suspicious for thrombus. Echo 02/20/24: EF 20 to 25%   NEUROLOGY ACUTE METABOLIC ENCEPHALOPATHY- improved  Acute metabolic encephalopathy: concerning for possible anoxic brain injury  Hx: Polysubstance abuse, depression, and schizoaffective disorder EEG 02/21/24: Study is suggestive of severe to profound diffuse encephalopathy. No seizures or epileptiform discharges were seen throughout the recording. MRI Brain 02/24/24: Since the previous MRI, the patient has developed chronic encephalomalacia changes within the right medial occipital lobe compatible with an interval PCA distribution infarct. Interval development of encephalomalacia in area of previously demonstrated  left MCA distribution infarct, which was acute on the previous MRI. - Urine drug screen negative  - Correct metabolic derangements  - WUA daily  - Avoid sedation medication as able    CARDIAC FAILURE- EF 20% -Lasix  as tolerated -follow up cardiac enzymes as indicated     RENAL -continue Foley Catheter-assess need -Avoid nephrotoxic agents -Follow urine output, BMP -Ensure adequate renal perfusion, optimize oxygenation -Renal dose medications   Intake/Output Summary (Last 24 hours) at 03/12/2024 0825 Last data filed at 03/12/2024 0809 Gross per 24 hour  Intake 2159 ml  Output 1003 ml  Net 1156 ml     ENDO - ICU hypoglycemic\Hyperglycemia protocol -check FSBS per protocol   GI GI PROPHYLAXIS as indicated NUTRITIONAL STATUS DIET-->TF's as tolerated Constipation protocol as indicated   ELECTROLYTES -follow labs as needed -replace as needed -pharmacy consultation and following  RESTRICTIVE TRANSFUSION PROTOCOL TRANSFUSION  IF HGB<7  or ACTIVE BLEEDING OR DX of ACUTE CORONARY SYNDROMES        Best Practice (right click and Reselect all SmartList Selections daily)   Diet/type: TF's DVT prophylaxis SCD's Pressure ulcer(s): N/A GI prophylaxis: PPI Lines: Central line (Right Brachial PICC line placed 03/03/24) Foley:  N/A Code Status:  full code Last date of multidisciplinary goals of care discussion [03/04/24]  Labs   CBC: Recent Labs  Lab 03/08/24 0343 03/09/24 0350 03/10/24 0415 03/11/24 0515 03/12/24 0513  WBC 14.5* 13.7* 11.9* 10.8* 9.6  HGB 7.8* 8.2* 8.3* 8.4* 8.0*  HCT 25.6* 26.5* 27.1* 27.4* 26.0*  MCV 103.2* 104.3* 103.4* 102.6* 103.6*  PLT 519* 585* 598* 622* 640*    Basic Metabolic Panel: Recent Labs  Lab 03/06/24 0415 03/07/24 0500 03/08/24 0343 03/09/24 0350 03/10/24 0415 03/11/24 0515 03/12/24 0513  NA 144   < > 142 143 144 145 144  K 3.9   < > 4.3 4.6 4.9 4.3 4.7  CL 107   < > 107 109 107 110 109  CO2 29   < > 30 29  32 30 31  GLUCOSE 117*   < > 159* 131* 105* 118* 127*  BUN 48*   < > 46* 43* 43* 50* 46*  CREATININE 1.13   < > 1.07 0.72 0.96 0.91 0.84  CALCIUM  7.6*   < > 7.8* 7.9* 7.8* 7.9* 8.0*  MG 2.2   < > 2.1 2.3 2.2 2.2 2.1  PHOS 2.8  --   --   --   --  2.6 3.0   < > = values in this interval not displayed.   GFR: Estimated Creatinine Clearance: 88.5 mL/min (by C-G formula based on SCr of 0.84 mg/dL). Recent Labs  Lab 03/09/24 0350 03/10/24 0415 03/11/24 0515 03/12/24 0513  WBC 13.7* 11.9* 10.8* 9.6    Liver Function Tests: Recent Labs  Lab 03/12/24 0513  ALBUMIN 1.9*    No results for input(s): LIPASE, AMYLASE in the last 168 hours. No results for input(s): AMMONIA in the last 168 hours.  ABG    Component Value Date/Time   PHART 7.52 (H) 03/04/2024 0647   PCO2ART 40 03/04/2024 0647   PO2ART 74 (L) 03/04/2024 0647   HCO3 32.7 (H) 03/04/2024 0647   ACIDBASEDEF 1.1 02/20/2024 1452   O2SAT 97.9 03/04/2024 0647     Coagulation Profile: No results for input(s): INR, PROTIME in the last 168 hours.   Cardiac Enzymes: No results for input(s): CKTOTAL, CKMB, CKMBINDEX, TROPONINI in the last 168 hours.  HbA1C: Hgb A1c MFr Bld  Date/Time Value Ref Range Status  07/14/2018 04:29 AM 5.8 (H) 4.8 - 5.6 % Final    Comment:    (NOTE) Pre diabetes:          5.7%-6.4% Diabetes:              >6.4% Glycemic control for   <7.0% adults with diabetes   07/13/2018 04:25 AM 6.2 (H) 4.8 - 5.6 % Final    Comment:    (NOTE) Pre diabetes:          5.7%-6.4% Diabetes:              >6.4% Glycemic control for   <7.0% adults with diabetes     CBG: Recent Labs  Lab 03/11/24 1606 03/11/24 1938 03/11/24 2331 03/12/24 0512 03/12/24 0528  GLUCAP 127* 123* 98 62* 122*      DVT/GI PRX  assessed I Assessed the need for Labs I Assessed the need for Foley I Assessed the need for Central Venous Line Family Discussion when available I Assessed the need for  Mobilization I made an Assessment of medications to be adjusted accordingly Safety Risk assessment completed  CASE DISCUSSED IN MULTIDISCIPLINARY ROUNDS WITH ICU TEAM   Critical care provider statement:   Total critical care time: 33 minutes   Performed by: Parris MD   Critical care time was exclusive of separately billable procedures and treating other patients.   Critical care was necessary to treat or prevent imminent or life-threatening deterioration.   Critical care was time spent personally by me on the following activities: development of treatment plan with patient and/or surrogate as well as nursing, discussions with consultants, evaluation of patient's response to treatment, examination of patient, obtaining history from patient or surrogate, ordering and performing treatments and interventions, ordering and review of laboratory studies, ordering and review of radiographic studies, pulse oximetry and re-evaluation of patient's condition.    Azka Steger, M.D.  Pulmonary & Critical Care Medicine

## 2024-03-14 DIAGNOSIS — J9621 Acute and chronic respiratory failure with hypoxia: Secondary | ICD-10-CM | POA: Diagnosis not present

## 2024-03-14 DIAGNOSIS — R6520 Severe sepsis without septic shock: Secondary | ICD-10-CM | POA: Diagnosis not present

## 2024-03-14 DIAGNOSIS — G931 Anoxic brain damage, not elsewhere classified: Secondary | ICD-10-CM | POA: Diagnosis not present

## 2024-03-14 DIAGNOSIS — I5022 Chronic systolic (congestive) heart failure: Secondary | ICD-10-CM | POA: Diagnosis not present

## 2024-03-16 DIAGNOSIS — R188 Other ascites: Secondary | ICD-10-CM | POA: Diagnosis not present

## 2024-03-16 DIAGNOSIS — G9340 Encephalopathy, unspecified: Secondary | ICD-10-CM | POA: Diagnosis not present

## 2024-03-16 DIAGNOSIS — I2510 Atherosclerotic heart disease of native coronary artery without angina pectoris: Secondary | ICD-10-CM | POA: Diagnosis not present

## 2024-03-16 DIAGNOSIS — G894 Chronic pain syndrome: Secondary | ICD-10-CM | POA: Diagnosis not present

## 2024-03-16 DIAGNOSIS — J9690 Respiratory failure, unspecified, unspecified whether with hypoxia or hypercapnia: Secondary | ICD-10-CM | POA: Diagnosis not present

## 2024-03-16 DIAGNOSIS — R6520 Severe sepsis without septic shock: Secondary | ICD-10-CM | POA: Diagnosis not present

## 2024-03-16 DIAGNOSIS — I5020 Unspecified systolic (congestive) heart failure: Secondary | ICD-10-CM | POA: Diagnosis not present

## 2024-03-16 DIAGNOSIS — Z930 Tracheostomy status: Secondary | ICD-10-CM

## 2024-03-16 DIAGNOSIS — M7981 Nontraumatic hematoma of soft tissue: Secondary | ICD-10-CM | POA: Diagnosis not present

## 2024-03-16 DIAGNOSIS — J9621 Acute and chronic respiratory failure with hypoxia: Secondary | ICD-10-CM

## 2024-03-16 DIAGNOSIS — G931 Anoxic brain damage, not elsewhere classified: Secondary | ICD-10-CM | POA: Diagnosis not present

## 2024-03-16 DIAGNOSIS — J189 Pneumonia, unspecified organism: Secondary | ICD-10-CM

## 2024-03-16 DIAGNOSIS — I700 Atherosclerosis of aorta: Secondary | ICD-10-CM | POA: Diagnosis not present

## 2024-03-16 DIAGNOSIS — I517 Cardiomegaly: Secondary | ICD-10-CM | POA: Diagnosis not present

## 2024-03-16 DIAGNOSIS — J90 Pleural effusion, not elsewhere classified: Secondary | ICD-10-CM | POA: Diagnosis not present

## 2024-03-16 DIAGNOSIS — Z978 Presence of other specified devices: Secondary | ICD-10-CM | POA: Diagnosis not present

## 2024-03-16 DIAGNOSIS — I5022 Chronic systolic (congestive) heart failure: Secondary | ICD-10-CM | POA: Diagnosis not present

## 2024-03-16 DIAGNOSIS — J9811 Atelectasis: Secondary | ICD-10-CM | POA: Diagnosis not present

## 2024-03-17 DIAGNOSIS — Z930 Tracheostomy status: Secondary | ICD-10-CM

## 2024-03-17 DIAGNOSIS — J9621 Acute and chronic respiratory failure with hypoxia: Secondary | ICD-10-CM

## 2024-03-17 DIAGNOSIS — J189 Pneumonia, unspecified organism: Secondary | ICD-10-CM

## 2024-03-17 DIAGNOSIS — J90 Pleural effusion, not elsewhere classified: Secondary | ICD-10-CM

## 2024-03-18 DIAGNOSIS — J90 Pleural effusion, not elsewhere classified: Secondary | ICD-10-CM | POA: Diagnosis not present

## 2024-03-18 DIAGNOSIS — J9621 Acute and chronic respiratory failure with hypoxia: Secondary | ICD-10-CM

## 2024-03-18 DIAGNOSIS — G894 Chronic pain syndrome: Secondary | ICD-10-CM | POA: Diagnosis not present

## 2024-03-18 DIAGNOSIS — J9811 Atelectasis: Secondary | ICD-10-CM | POA: Diagnosis not present

## 2024-03-18 DIAGNOSIS — Z930 Tracheostomy status: Secondary | ICD-10-CM | POA: Diagnosis not present

## 2024-03-18 DIAGNOSIS — Z978 Presence of other specified devices: Secondary | ICD-10-CM | POA: Diagnosis not present

## 2024-03-18 DIAGNOSIS — G9340 Encephalopathy, unspecified: Secondary | ICD-10-CM | POA: Diagnosis not present

## 2024-03-18 DIAGNOSIS — J189 Pneumonia, unspecified organism: Secondary | ICD-10-CM

## 2024-03-18 DIAGNOSIS — J9690 Respiratory failure, unspecified, unspecified whether with hypoxia or hypercapnia: Secondary | ICD-10-CM | POA: Diagnosis not present

## 2024-03-18 DIAGNOSIS — I5020 Unspecified systolic (congestive) heart failure: Secondary | ICD-10-CM | POA: Diagnosis not present

## 2024-03-19 DIAGNOSIS — Z930 Tracheostomy status: Secondary | ICD-10-CM

## 2024-03-19 DIAGNOSIS — J189 Pneumonia, unspecified organism: Secondary | ICD-10-CM

## 2024-03-19 DIAGNOSIS — J90 Pleural effusion, not elsewhere classified: Secondary | ICD-10-CM

## 2024-03-19 DIAGNOSIS — J9621 Acute and chronic respiratory failure with hypoxia: Secondary | ICD-10-CM

## 2024-03-20 DIAGNOSIS — I5020 Unspecified systolic (congestive) heart failure: Secondary | ICD-10-CM | POA: Diagnosis not present

## 2024-03-20 DIAGNOSIS — Z930 Tracheostomy status: Secondary | ICD-10-CM

## 2024-03-20 DIAGNOSIS — J189 Pneumonia, unspecified organism: Secondary | ICD-10-CM

## 2024-03-20 DIAGNOSIS — G9340 Encephalopathy, unspecified: Secondary | ICD-10-CM | POA: Diagnosis not present

## 2024-03-20 DIAGNOSIS — J9621 Acute and chronic respiratory failure with hypoxia: Secondary | ICD-10-CM

## 2024-03-20 DIAGNOSIS — J90 Pleural effusion, not elsewhere classified: Secondary | ICD-10-CM

## 2024-03-20 DIAGNOSIS — J9690 Respiratory failure, unspecified, unspecified whether with hypoxia or hypercapnia: Secondary | ICD-10-CM | POA: Diagnosis not present

## 2024-03-20 DIAGNOSIS — G894 Chronic pain syndrome: Secondary | ICD-10-CM | POA: Diagnosis not present

## 2024-03-21 DIAGNOSIS — G931 Anoxic brain damage, not elsewhere classified: Secondary | ICD-10-CM | POA: Diagnosis not present

## 2024-03-21 DIAGNOSIS — J90 Pleural effusion, not elsewhere classified: Secondary | ICD-10-CM

## 2024-03-21 DIAGNOSIS — J189 Pneumonia, unspecified organism: Secondary | ICD-10-CM

## 2024-03-21 DIAGNOSIS — J9621 Acute and chronic respiratory failure with hypoxia: Secondary | ICD-10-CM

## 2024-03-21 DIAGNOSIS — I5022 Chronic systolic (congestive) heart failure: Secondary | ICD-10-CM | POA: Diagnosis not present

## 2024-03-21 DIAGNOSIS — Z930 Tracheostomy status: Secondary | ICD-10-CM

## 2024-03-21 DIAGNOSIS — R6520 Severe sepsis without septic shock: Secondary | ICD-10-CM | POA: Diagnosis not present

## 2024-03-22 DIAGNOSIS — J189 Pneumonia, unspecified organism: Secondary | ICD-10-CM

## 2024-03-22 DIAGNOSIS — Z930 Tracheostomy status: Secondary | ICD-10-CM

## 2024-03-22 DIAGNOSIS — J9621 Acute and chronic respiratory failure with hypoxia: Secondary | ICD-10-CM

## 2024-03-22 DIAGNOSIS — J90 Pleural effusion, not elsewhere classified: Secondary | ICD-10-CM

## 2024-03-23 DIAGNOSIS — A419 Sepsis, unspecified organism: Secondary | ICD-10-CM | POA: Diagnosis not present

## 2024-03-23 DIAGNOSIS — G894 Chronic pain syndrome: Secondary | ICD-10-CM | POA: Diagnosis not present

## 2024-03-23 DIAGNOSIS — G9340 Encephalopathy, unspecified: Secondary | ICD-10-CM | POA: Diagnosis not present

## 2024-03-23 DIAGNOSIS — J9601 Acute respiratory failure with hypoxia: Secondary | ICD-10-CM | POA: Diagnosis not present

## 2024-03-23 DIAGNOSIS — I5020 Unspecified systolic (congestive) heart failure: Secondary | ICD-10-CM | POA: Diagnosis not present

## 2024-03-23 DIAGNOSIS — N179 Acute kidney failure, unspecified: Secondary | ICD-10-CM | POA: Diagnosis not present

## 2024-03-23 DIAGNOSIS — J9690 Respiratory failure, unspecified, unspecified whether with hypoxia or hypercapnia: Secondary | ICD-10-CM | POA: Diagnosis not present

## 2024-03-24 DIAGNOSIS — K661 Hemoperitoneum: Secondary | ICD-10-CM | POA: Diagnosis not present

## 2024-03-24 DIAGNOSIS — N179 Acute kidney failure, unspecified: Secondary | ICD-10-CM | POA: Diagnosis not present

## 2024-03-24 DIAGNOSIS — G9340 Encephalopathy, unspecified: Secondary | ICD-10-CM | POA: Diagnosis not present

## 2024-03-24 DIAGNOSIS — Z930 Tracheostomy status: Secondary | ICD-10-CM | POA: Diagnosis not present

## 2024-03-24 DIAGNOSIS — J9621 Acute and chronic respiratory failure with hypoxia: Secondary | ICD-10-CM | POA: Diagnosis not present

## 2024-03-24 DIAGNOSIS — D72829 Elevated white blood cell count, unspecified: Secondary | ICD-10-CM | POA: Diagnosis not present

## 2024-03-24 DIAGNOSIS — J90 Pleural effusion, not elsewhere classified: Secondary | ICD-10-CM | POA: Diagnosis not present

## 2024-03-24 DIAGNOSIS — J9601 Acute respiratory failure with hypoxia: Secondary | ICD-10-CM | POA: Diagnosis not present

## 2024-03-24 DIAGNOSIS — A419 Sepsis, unspecified organism: Secondary | ICD-10-CM | POA: Diagnosis not present

## 2024-03-25 DIAGNOSIS — I639 Cerebral infarction, unspecified: Secondary | ICD-10-CM | POA: Diagnosis not present

## 2024-03-25 DIAGNOSIS — G9340 Encephalopathy, unspecified: Secondary | ICD-10-CM | POA: Diagnosis not present

## 2024-03-25 DIAGNOSIS — G894 Chronic pain syndrome: Secondary | ICD-10-CM | POA: Diagnosis not present

## 2024-03-25 DIAGNOSIS — A419 Sepsis, unspecified organism: Secondary | ICD-10-CM | POA: Diagnosis not present

## 2024-03-25 DIAGNOSIS — J9601 Acute respiratory failure with hypoxia: Secondary | ICD-10-CM | POA: Diagnosis not present

## 2024-03-25 DIAGNOSIS — I5020 Unspecified systolic (congestive) heart failure: Secondary | ICD-10-CM | POA: Diagnosis not present

## 2024-03-25 DIAGNOSIS — J9690 Respiratory failure, unspecified, unspecified whether with hypoxia or hypercapnia: Secondary | ICD-10-CM | POA: Diagnosis not present

## 2024-03-26 DIAGNOSIS — G9340 Encephalopathy, unspecified: Secondary | ICD-10-CM | POA: Diagnosis not present

## 2024-03-26 DIAGNOSIS — J9601 Acute respiratory failure with hypoxia: Secondary | ICD-10-CM | POA: Diagnosis not present

## 2024-03-26 DIAGNOSIS — I639 Cerebral infarction, unspecified: Secondary | ICD-10-CM | POA: Diagnosis not present

## 2024-03-26 DIAGNOSIS — A419 Sepsis, unspecified organism: Secondary | ICD-10-CM | POA: Diagnosis not present

## 2024-03-28 DIAGNOSIS — Z930 Tracheostomy status: Secondary | ICD-10-CM

## 2024-03-28 DIAGNOSIS — J189 Pneumonia, unspecified organism: Secondary | ICD-10-CM

## 2024-03-28 DIAGNOSIS — J9621 Acute and chronic respiratory failure with hypoxia: Secondary | ICD-10-CM

## 2024-03-28 DIAGNOSIS — J90 Pleural effusion, not elsewhere classified: Secondary | ICD-10-CM

## 2024-03-29 DIAGNOSIS — J90 Pleural effusion, not elsewhere classified: Secondary | ICD-10-CM

## 2024-03-29 DIAGNOSIS — J9621 Acute and chronic respiratory failure with hypoxia: Secondary | ICD-10-CM

## 2024-03-29 DIAGNOSIS — J189 Pneumonia, unspecified organism: Secondary | ICD-10-CM

## 2024-03-29 DIAGNOSIS — Z930 Tracheostomy status: Secondary | ICD-10-CM

## 2024-03-30 DIAGNOSIS — J9621 Acute and chronic respiratory failure with hypoxia: Secondary | ICD-10-CM

## 2024-03-30 DIAGNOSIS — J90 Pleural effusion, not elsewhere classified: Secondary | ICD-10-CM

## 2024-03-30 DIAGNOSIS — J189 Pneumonia, unspecified organism: Secondary | ICD-10-CM

## 2024-03-30 DIAGNOSIS — Z930 Tracheostomy status: Secondary | ICD-10-CM

## 2024-03-31 DIAGNOSIS — Z930 Tracheostomy status: Secondary | ICD-10-CM

## 2024-03-31 DIAGNOSIS — J189 Pneumonia, unspecified organism: Secondary | ICD-10-CM

## 2024-03-31 DIAGNOSIS — J9621 Acute and chronic respiratory failure with hypoxia: Secondary | ICD-10-CM

## 2024-03-31 DIAGNOSIS — J90 Pleural effusion, not elsewhere classified: Secondary | ICD-10-CM

## 2024-04-01 DIAGNOSIS — J189 Pneumonia, unspecified organism: Secondary | ICD-10-CM

## 2024-04-01 DIAGNOSIS — J90 Pleural effusion, not elsewhere classified: Secondary | ICD-10-CM

## 2024-04-01 DIAGNOSIS — Z930 Tracheostomy status: Secondary | ICD-10-CM

## 2024-04-01 DIAGNOSIS — J9621 Acute and chronic respiratory failure with hypoxia: Secondary | ICD-10-CM

## 2024-04-03 DIAGNOSIS — J9621 Acute and chronic respiratory failure with hypoxia: Secondary | ICD-10-CM

## 2024-04-03 DIAGNOSIS — J189 Pneumonia, unspecified organism: Secondary | ICD-10-CM

## 2024-04-03 DIAGNOSIS — Z930 Tracheostomy status: Secondary | ICD-10-CM

## 2024-04-03 DIAGNOSIS — J90 Pleural effusion, not elsewhere classified: Secondary | ICD-10-CM

## 2024-04-04 DIAGNOSIS — J90 Pleural effusion, not elsewhere classified: Secondary | ICD-10-CM

## 2024-04-04 DIAGNOSIS — J9621 Acute and chronic respiratory failure with hypoxia: Secondary | ICD-10-CM

## 2024-04-04 DIAGNOSIS — Z930 Tracheostomy status: Secondary | ICD-10-CM

## 2024-04-04 DIAGNOSIS — J189 Pneumonia, unspecified organism: Secondary | ICD-10-CM

## 2024-04-05 DIAGNOSIS — J9621 Acute and chronic respiratory failure with hypoxia: Secondary | ICD-10-CM

## 2024-04-05 DIAGNOSIS — J90 Pleural effusion, not elsewhere classified: Secondary | ICD-10-CM

## 2024-04-05 DIAGNOSIS — Z930 Tracheostomy status: Secondary | ICD-10-CM

## 2024-04-05 DIAGNOSIS — J189 Pneumonia, unspecified organism: Secondary | ICD-10-CM

## 2024-04-09 DIAGNOSIS — J90 Pleural effusion, not elsewhere classified: Secondary | ICD-10-CM | POA: Diagnosis not present

## 2024-04-09 DIAGNOSIS — G9341 Metabolic encephalopathy: Secondary | ICD-10-CM | POA: Diagnosis not present

## 2024-07-17 ENCOUNTER — Other Ambulatory Visit: Payer: Self-pay

## 2024-07-17 ENCOUNTER — Emergency Department
Admission: EM | Admit: 2024-07-17 | Discharge: 2024-07-18 | Disposition: A | Discharge status: Other Acute Inpatient Hospital | Attending: Emergency Medicine | Admitting: Emergency Medicine

## 2024-07-17 DIAGNOSIS — Z91199 Patient's noncompliance with other medical treatment and regimen due to unspecified reason: Secondary | ICD-10-CM

## 2024-07-17 DIAGNOSIS — F209 Schizophrenia, unspecified: Secondary | ICD-10-CM | POA: Insufficient documentation

## 2024-07-17 DIAGNOSIS — F29 Unspecified psychosis not due to a substance or known physiological condition: Secondary | ICD-10-CM | POA: Insufficient documentation

## 2024-07-17 DIAGNOSIS — F250 Schizoaffective disorder, bipolar type: Secondary | ICD-10-CM | POA: Diagnosis present

## 2024-07-17 DIAGNOSIS — F1410 Cocaine abuse, uncomplicated: Secondary | ICD-10-CM | POA: Diagnosis present

## 2024-07-17 LAB — COMPREHENSIVE METABOLIC PANEL WITH GFR
ALT: 11 U/L (ref 0–44)
AST: 23 U/L (ref 15–41)
Albumin: 4 g/dL (ref 3.5–5.0)
Alkaline Phosphatase: 88 U/L (ref 38–126)
Anion gap: 8 (ref 5–15)
BUN: 15 mg/dL (ref 8–23)
CO2: 28 mmol/L (ref 22–32)
Calcium: 9.1 mg/dL (ref 8.9–10.3)
Chloride: 104 mmol/L (ref 98–111)
Creatinine, Ser: 1.18 mg/dL (ref 0.61–1.24)
GFR, Estimated: >60 mL/min (ref >60)
Glucose, Bld: 84 mg/dL (ref 70–99)
Potassium: 4.4 mmol/L (ref 3.5–5.1)
Sodium: 140 mmol/L (ref 135–145)
Total Bilirubin: 0.4 mg/dL (ref 0.0–1.2)
Total Protein: 8.2 g/dL — ABNORMAL HIGH (ref 6.5–8.1)

## 2024-07-17 LAB — SALICYLATE LEVEL: Salicylate Lvl: <7 mg/dL — ABNORMAL LOW (ref 7.0–30.0)

## 2024-07-17 LAB — CBC
HCT: 36.2 % — ABNORMAL LOW (ref 39.0–52.0)
Hemoglobin: 11.5 g/dL — ABNORMAL LOW (ref 13.0–17.0)
MCH: 31.3 pg (ref 26.0–34.0)
MCHC: 31.8 g/dL (ref 30.0–36.0)
MCV: 98.6 fL (ref 80.0–100.0)
Platelets: 404 K/uL — ABNORMAL HIGH (ref 150–400)
RBC: 3.67 MIL/uL — ABNORMAL LOW (ref 4.22–5.81)
RDW: 17.1 % — ABNORMAL HIGH (ref 11.5–15.5)
WBC: 7.1 K/uL (ref 4.0–10.5)
nRBC: 0 % (ref 0.0–0.2)

## 2024-07-17 LAB — ACETAMINOPHEN LEVEL: Acetaminophen (Tylenol), Serum: <10 ug/mL — ABNORMAL LOW (ref 10–30)

## 2024-07-17 LAB — URINE DRUG SCREEN
Amphetamines: NEGATIVE
Barbiturates: NEGATIVE
Benzodiazepines: NEGATIVE
Cocaine: NEGATIVE
Fentanyl: NEGATIVE
Methadone Scn, Ur: NEGATIVE
Opiates: NEGATIVE
Tetrahydrocannabinol: NEGATIVE

## 2024-07-17 LAB — ETHANOL: Alcohol, Ethyl (B): <15 mg/dL (ref <15)

## 2024-07-17 NOTE — ED Notes (Signed)
Provided warm blankets.

## 2024-07-17 NOTE — ED Notes (Signed)
 Called dietary for lunch tray, they said it should be here around 1305.

## 2024-07-17 NOTE — BH Assessment (Signed)
 Comprehensive Clinical Assessment (CCA) Note  07/17/2024 [name] [id number]  Chief Complaint:  Chief Complaint  Patient presents with   IVC   Visit Diagnosis: Schizophrenia  Miguel [name] is a 73 year old male who presents to the ER after his brother was advised to bring the patient to the ER by his Miguel [name] ACT Team. Patient responding to internal stimuli and paranoid. Per the patient, he was in jail for six months and was taking his medications. However, per the report of the patient's brother, he wasn't in jail but was inpatient for medical reasons for those six months. He was discharged this past Friday, since he has been home, he has been talking more about people trying to harm him. Multiple occasions, he has gone in the brother's room whispering, saying he was hearing people talk about him and plotting to hurt both of them. Brother further shared, he has had the symptoms in the past but now they have increased in frequency and in intensity.   CCA Screening, Triage and Referral (STR)  Patient Reported Information How did you hear about us ? Family/Friend  What Is the Reason for Your Visit/Call Today? Brother was advised to bring the patient to the ER by his ACT Team. Patient responding to internal stimuli and paranoid.  How Long Has This Been Causing You Problems? 1 wk - 1 month  What Do You Feel Would Help You the Most Today? Treatment for Depression or other mood problem   Have You Recently Had Any Thoughts About Hurting Yourself? No  Are You Planning to Commit Suicide/Harm Yourself At This time? No   Flowsheet Row ED from 07/17/2024 in [facility] at [facility] ED to Hosp-Admission (Discharged) from 02/20/2024 in [facility]U ED from 04/27/2023 in [facility] Department at [facility]  C-SSRS RISK CATEGORY No Risk No Risk No Risk    Have you Recently Had Thoughts About Hurting Someone  [place]? No  Are You Planning to Harm Someone at This Time? No  Explanation: No data recorded  Have You Used Any Alcohol or Drugs in the Past 24 Hours? No  How Long Ago Did You Use Drugs or Alcohol? No data recorded What Did You Use and How Much? No data recorded  Do You Currently Have a Therapist/Psychiatrist? Yes  Name of Therapist/Psychiatrist: Name of Therapist/Psychiatrist: Waynard [name] ACT Team   Have You Been Recently Discharged From Any Office Practice or Programs? No  Explanation of Discharge From Practice/Program: No data recorded    CCA Screening Triage Referral Assessment Type of Contact: Face-to-Face  Telemedicine Service Delivery:   Is this Initial or Reassessment?   Date Telepsych consult ordered in [facility]:    Time Telepsych consult ordered in [facility]:    Location of Assessment: [facility] ED  Provider Location: [facility] ED   Collateral Involvement: No data recorded  Does Patient Have a Court Appointed Legal Guardian? No  Legal Guardian Contact Information: No data recorded Copy of Legal Guardianship Form: No data recorded Legal Guardian Notified of Arrival: No data recorded Legal Guardian Notified of Pending Discharge: No data recorded If Minor and Not Living with Parent(s), Who has Custody? No data recorded Is CPS involved or ever been involved? Never  Is APS involved or ever been involved? Never   Patient Determined To Be At Risk for Harm To Self or Others Based on Review of Patient Reported Information or Presenting Complaint? No  Method: No data recorded Availability of Means:  No data recorded Intent: No data recorded Notification Required: No data recorded Additional Information for Danger to Others Potential: No data recorded Additional Comments for Danger to Others Potential: No data recorded Are There Guns or Other Weapons in Your Home? No  Types of Guns/Weapons: No data recorded Are These Weapons Safely Secured?                            No  Who Could  Verify You Are Able To Have These Secured: No data recorded Do You Have any Outstanding Charges, Pending Court Dates, Parole/Probation? No data recorded Contacted To Inform of Risk of Harm To Self or Others: No data recorded   Does Patient Present under Involuntary Commitment? Yes   [place] of Residence: [place]   Patient Currently Receiving the Following Services: Not Receiving Services   Determination of Need: Emergent (2 hours)   Options For Referral: Inpatient Hospitalization     CCA Biopsychosocial Patient Reported Schizophrenia/Schizoaffective Diagnosis in Past: No   Strengths: Family support, stable housing and received outpatient treatment.   Mental Health Symptoms Depression:  Change in energy/activity   Duration of Depressive symptoms: Duration of Depressive Symptoms: Greater than two weeks   Mania:  Change in energy/activity   Anxiety:   Restlessness; Difficulty concentrating; Worrying   Psychosis:  None   Duration of Psychotic symptoms:    Trauma:  N/A   Obsessions:  N/A   Compulsions:  N/A   Inattention:  N/A   Hyperactivity/Impulsivity:  N/A   Oppositional/Defiant Behaviors:  N/A   Emotional Irregularity:  N/A   Other Mood/Personality Symptoms:  No data recorded   Mental Status Exam Appearance and self-care  Stature:  Average   Weight:  Average weight   Clothing:  Neat/clean; Age-appropriate   Grooming:  Normal   Cosmetic use:  None   Posture/gait:  Normal   Motor activity:  -- (Within normal range)   Sensorium  Attention:  Normal   Concentration:  Focuses on irrelevancies   Orientation:  X5   Recall/memory:  Normal   Affect and Mood  Affect:  Full Range   Mood:  Anxious   Relating  Eye contact:  Normal   Facial expression:  Depressed; Anxious   Attitude toward examiner:  Cooperative   Thought and Language  Speech flow: Clear and Coherent; Normal   Thought content:  Appropriate to Mood and Circumstances    Preoccupation:  None   Hallucinations:  Auditory   Organization:  Engineer, Site of Knowledge:  Fair   Intelligence:  Average   Abstraction:  Functional   Judgement:  Impaired   Reality Testing:  Distorted   Insight:  Fair   Decision Making:  Vacilates   Social Functioning  Social Maturity:  Responsible   Social Judgement:  Heedless   Stress  Stressors:  School   Coping Ability:  Normal   Skill Deficits:  None   Supports:  Family     Religion: Religion/Spirituality Are You A Religious Person?: No  Leisure/Recreation: Leisure / Recreation Do You Have Hobbies?: No  Exercise/Diet: Exercise/Diet Do You Exercise?: No Have You Gained or Lost A Significant Amount of Weight in the Past Six Months?: No Do You Follow a Special Diet?: No Do You Have Any Trouble Sleeping?: No   CCA Employment/Education Employment/Work Situation: Employment / Work Situation Employment Situation: On disability Patient's Job has Been Impacted by Current Illness: No Has Patient  ever Been in the Military?: No  Education: Education Is Patient Currently Attending School?: No Did You Have An Individualized Education Program (IIEP): No Did You Have Any Difficulty At School?: No Patient's Education Has Been Impacted by Current Illness: No   CCA Family/Childhood History Family and Relationship History: Family history Marital status: Single Does patient have children?: No  Childhood History:  Childhood History By whom was/is the patient raised?: Both parents Did patient suffer any verbal/emotional/physical/sexual abuse as a child?: No Did patient suffer from severe childhood neglect?: No Has patient ever been sexually abused/assaulted/raped as an adolescent or adult?: No Was the patient ever a victim of a crime or a disaster?: No Witnessed domestic violence?: No Has patient been affected by domestic violence as an adult?: No       CCA  Substance Use Alcohol/Drug Use: Alcohol / Drug Use Pain Medications: See MAR Prescriptions: See MAR Over the Counter: See MAR History of alcohol / drug use?: No history of alcohol / drug abuse Longest period of sobriety (when/how long): n/a     ASAM's:  Six Dimensions of Multidimensional Assessment  Dimension 1:  Acute Intoxication and/or Withdrawal Potential:      Dimension 2:  Biomedical Conditions and Complications:      Dimension 3:  Emotional, Behavioral, or Cognitive Conditions and Complications:     Dimension 4:  Readiness to Change:     Dimension 5:  Relapse, Continued use, or Continued Problem Potential:     Dimension 6:  Recovery/Living Environment:     ASAM Severity Score:    ASAM Recommended Level of Treatment:     Substance use Disorder (SUD)    Recommendations for Services/Supports/Treatments:    Disposition Recommendation per psychiatric provider: Inpatient Treatment   DSM5 Diagnoses: Patient Active Problem List   Diagnosis Date Noted   Protein-calorie malnutrition, severe 03/03/2024   Cardiac arrest (HCC) 02/20/2024   Malnutrition of moderate degree 02/20/2024   Aspiration pneumonia of both lungs due to gastric secretions (HCC) 02/20/2024   HFrEF (heart failure with reduced ejection fraction) (HCC) 02/20/2024   LV (left ventricular) mural thrombus 02/20/2024   Elevated troponin 02/20/2024   Transaminitis 02/20/2024   Dilated cardiomyopathy (HCC) 02/20/2024   Schizoaffective disorder, bipolar type (HCC) 09/14/2022   Hypertensive emergency 09/13/2022   Bilateral pleural effusion 09/13/2022   Cocaine use disorder (HCC) 09/13/2022   Acute on chronic systolic CHF (congestive heart failure) (HCC) 09/10/2022   Acute respiratory failure with hypoxia (HCC) 09/10/2022   Stroke (HCC) 07/12/2018   Noncompliance 04/11/2016    Referrals to Alternative Service(s): Referred to Alternative Service(s):   Place:   Date:   Time:    Referred to Alternative  Service(s):   Place:   Date:   Time:    Referred to Alternative Service(s):   Place:   Date:   Time:    Referred to Alternative Service(s):   Place:   Date:   Time:     Kiki DOROTHA Barge MS, LCAS, [facility], [facility] Therapeutic Triage Specialist 07/17/2024 3:42 PM

## 2024-07-17 NOTE — ED Notes (Signed)
Provided more warm blankets.

## 2024-07-17 NOTE — ED Notes (Signed)
 Pt ambulatory to room 20.

## 2024-07-17 NOTE — ED Provider Notes (Signed)
 [facility] Provider Note   Event Date/Time   First MD Initiated Contact with Patient 07/17/24 1056     (approximate) History  IVC  HPI SENECA HOBACK is a 73 y.o. male with a past medical history of schizophrenia who presents under IVC after reportedly refusing to take any of his medications.  Family is concerned that patient had a heart attack 6 months ago while he was in jail and is now refusing to take any of the medications that he was given at discharge.  Patient does not give any excuses to why is not taking these medications.  Patient denies any other complaints at this time.  Per IVC patient is also hearing voices.  Patient currently denies any SI, HI, or AVH ROS: Patient currently denies any vision changes, tinnitus, difficulty speaking, facial droop, sore throat, chest pain, shortness of breath, abdominal pain, nausea/vomiting/diarrhea, dysuria, or weakness/numbness/paresthesias in any extremity   Physical Exam  Triage Vital Signs: ED Triage Vitals [07/17/24 1046]  Encounter Vitals Group     BP (!) 148/69     Girls Systolic BP Percentile      Girls Diastolic BP Percentile      Boys Systolic BP Percentile      Boys Diastolic BP Percentile      Pulse Rate 72     Resp 18     Temp 98.1 F (36.7 C)     Temp Source Oral     SpO2 98 %     Weight      Height      Head Circumference      Peak Flow      Pain Score 0     Pain Loc      Pain Education      Exclude from Growth Chart    Most recent vital signs: Vitals:   07/17/24 1046  BP: (!) 148/69  Pulse: 72  Resp: 18  Temp: 98.1 F (36.7 C)  SpO2: 98%   General: Awake, oriented x4. CV:  Good peripheral perfusion. Resp:  Normal effort. Abd:  No distention. Other:  Elderly well-developed, well-nourished African-American male resting comfortably in no acute distress ED Results / Procedures / Treatments  Labs (all labs ordered are listed, but only abnormal results are displayed) Labs  Reviewed  COMPREHENSIVE METABOLIC PANEL WITH GFR - Abnormal; Notable for the following components:      Result Value   Total Protein 8.2 (*)    All other components within normal limits  CBC - Abnormal; Notable for the following components:   RBC 3.67 (*)    Hemoglobin 11.5 (*)    HCT 36.2 (*)    RDW 17.1 (*)    Platelets 404 (*)    All other components within normal limits  SALICYLATE LEVEL - Abnormal; Notable for the following components:   Salicylate Lvl <7.0 (*)    All other components within normal limits  ACETAMINOPHEN  LEVEL - Abnormal; Notable for the following components:   Acetaminophen  (Tylenol ), Serum <10 (*)    All other components within normal limits  ETHANOL  URINE DRUG SCREEN  PROCEDURES: Critical Care performed: No Procedures MEDICATIONS ORDERED IN ED: Medications - No data to display IMPRESSION / MDM / ASSESSMENT AND PLAN / ED COURSE  I reviewed the triage vital signs and the nursing notes.  The patient is on the cardiac monitor to evaluate for evidence of arrhythmia and/or significant heart rate changes. Patient's presentation is most consistent with acute presentation with potential threat to life or bodily function. Patient presents under IVC for hallucinations/delusions. Thoughts are disorganized. No history of prior suicide attempt, and no SI or HI at this time. Clinically w/ no overt toxidrome, low suspicion for ingestion given hx and exam Thoughts unlikely 2/2 anemia, hypothyroidism, infection, or ICH. Patient's decision making capacity is compromised and they are unable to perform all ADL's (additionally they are without appropriate caretakers to assist through this deficit).  Consult: Psychiatry to evaluate patient for grave disability Disposition: Pending psychiatric evaluation  Care of this patient will be signed out the oncoming physician.  All pertinent patient formation is conveyed and all questions answered.  All  further care and disposition decisions will be made by the oncoming physician.   FINAL CLINICAL IMPRESSION(S) / ED DIAGNOSES   Final diagnoses:  Psychosis, unspecified psychosis type (HCC)   Rx / DC Orders   ED Discharge Orders     None      Note:  This document was prepared using Dragon voice recognition software and may include unintentional dictation errors.   Jamoni Hewes K, MD 07/17/24 1228

## 2024-07-17 NOTE — ED Notes (Signed)
 Pt given grape juice. Pt requesting some pills that he stated he was given in the past for an issue with his rectum. Pt informed that I will notify his RN. RN made aware of pt concern. No further requests at this time.

## 2024-07-17 NOTE — ED Notes (Signed)
 This EDT provided dinner tray.

## 2024-07-17 NOTE — ED Notes (Signed)
 Patient in assigned room eating lunch at this time.

## 2024-07-17 NOTE — ED Notes (Signed)
 Patient in assigned room sitting on side of bed

## 2024-07-17 NOTE — ED Triage Notes (Signed)
 Pt to ED via [facility] under IVC. Papers taken out by brother and state pt dx with schizophrenia and he had a MI in jail 6 months ago. Pt is now refusing to take meds and brother feels pt is still not quite right. Pt with word salad and talking about contracts in triage. Pt denies etoh or drug use.

## 2024-07-17 NOTE — ED Notes (Addendum)
 Pt dressed out by this RN and EDT:  1 black hat 1 tan sweater 1 red shirt  1 white undershirt 1 cane 1 pair gray sweatpants 1 pair tan shoes  1 pack cigarettes

## 2024-07-17 NOTE — ED Notes (Signed)
 This RN spoke with Thersia from [facility] and she advised she could accept him under the condition a repeat echo is performed and his ejection fraction is >30%. MD Mumma added to chat and made aware.

## 2024-07-17 NOTE — ED Notes (Signed)
IVC pending consult   

## 2024-07-17 NOTE — ED Notes (Addendum)
 Miguel [name] called and asked for report. Staff said I dont think we are going to be able to take him. I questioned why? She said well he was inpatient in jail for 6 months but for why? He stopped taking his meds?. I advised no, he had an MI while in jail and required medical care post MI while in jail. They advised they would call back.

## 2024-07-17 NOTE — ED Notes (Signed)
 Pt is calm. Rexford was removed from room. Pt has his glasses. Water  provided. Pt appears confused.

## 2024-07-17 NOTE — ED Notes (Addendum)
 Pt ABCs intact. RR even and unlabored. Pt in NAD. Bed in lowest locked position. Call bell in reach. Denies needs at this time.    Past Medical History:  Diagnosis Date   Biventricular failure (HCC)    CVA (cerebral vascular accident) (HCC)    Polysubstance use disorder    Schizophrenia (HCC)

## 2024-07-17 NOTE — Progress Notes (Signed)
 Per [facility]), there are no appropriate beds available within [facility] system.  Patient was referred to the following facilities:  Service Provider Phone  [facility]  [phone]  [facility]  [phone]  [facility]  [phone]  [facility]  [phone]  [facility]  [phone]  [facility]  [phone]  [facility]  [phone]  [facility]  [phone]  [facility]  [phone]  [facility]  [phone]  [facility]  [phone]  [place] [facility]  [phone]  [facility]  [phone]  [facility]  [phone]  [facility]  [phone]  [facility]  [phone]  [facility]  [phone]    Charmaine Placido, [place] [phone]

## 2024-07-17 NOTE — ED Notes (Signed)
Pt not in room yet.

## 2024-07-17 NOTE — ED Notes (Signed)
 IVC pending placement tts consult done

## 2024-07-17 NOTE — ED Notes (Signed)
Provided lunch tray to pt.

## 2024-07-18 ENCOUNTER — Inpatient Hospital Stay
Admission: AD | Admit: 2024-07-18 | Source: Intra-hospital | Attending: Child & Adolescent Psychiatry | Admitting: Child & Adolescent Psychiatry

## 2024-07-18 ENCOUNTER — Encounter: Payer: Self-pay | Admitting: Child & Adolescent Psychiatry

## 2024-07-18 ENCOUNTER — Other Ambulatory Visit: Payer: Self-pay

## 2024-07-18 DIAGNOSIS — F203 Undifferentiated schizophrenia: Secondary | ICD-10-CM

## 2024-07-18 DIAGNOSIS — F209 Schizophrenia, unspecified: Secondary | ICD-10-CM | POA: Diagnosis present

## 2024-07-18 DIAGNOSIS — F200 Paranoid schizophrenia: Secondary | ICD-10-CM | POA: Insufficient documentation

## 2024-07-18 DIAGNOSIS — F03918 Unspecified dementia, unspecified severity, with other behavioral disturbance: Secondary | ICD-10-CM | POA: Insufficient documentation

## 2024-07-18 LAB — RESP PANEL BY RT-PCR (RSV, FLU A&B, COVID)  RVPGX2
Influenza A by PCR: NEGATIVE
Influenza B by PCR: NEGATIVE
Resp Syncytial Virus by PCR: NEGATIVE
SARS Coronavirus 2 by RT PCR: NEGATIVE

## 2024-07-18 LAB — SEDIMENTATION RATE: Sed Rate: 67 mm/h — ABNORMAL HIGH (ref 0–20)

## 2024-07-18 LAB — C-REACTIVE PROTEIN: CRP: 0.6 mg/dL (ref <1.0)

## 2024-07-18 MED ORDER — AMIODARONE HCL 200 MG PO TABS
400.0000 mg | ORAL_TABLET | Freq: Every day | ORAL | Status: DC
  Administered 2024-07-18: 400 mg via ORAL
  Filled 2024-07-18: qty 2

## 2024-07-18 MED ORDER — DOCUSATE SODIUM 50 MG/5ML PO LIQD: 100.0000 mg | Freq: Two times a day (BID) | ORAL | Status: DC

## 2024-07-18 MED ORDER — HYDROXYZINE HCL 25 MG PO TABS
25.0000 mg | ORAL_TABLET | Freq: Three times a day (TID) | ORAL | Status: AC | PRN
  Administered 2024-07-22 – 2024-10-02 (×36): 25 mg via ORAL
  Filled 2024-07-18 (×33): qty 1

## 2024-07-18 MED ORDER — ALUM & MAG HYDROXIDE-SIMETH 200-200-20 MG/5ML PO SUSP
30.0000 mL | ORAL | Status: AC | PRN
  Administered 2024-07-22 – 2024-08-05 (×3): 30 mL via ORAL
  Filled 2024-07-18 (×3): qty 30

## 2024-07-18 MED ORDER — METOPROLOL TARTRATE 25 MG PO TABS: 25.0000 mg | ORAL_TABLET | Freq: Two times a day (BID) | ORAL | Status: DC

## 2024-07-18 MED ORDER — ACETAMINOPHEN 325 MG PO TABS
650.0000 mg | ORAL_TABLET | Freq: Four times a day (QID) | ORAL | Status: AC | PRN
  Administered 2024-07-24 – 2024-09-30 (×16): 650 mg via ORAL
  Filled 2024-07-18 (×16): qty 2

## 2024-07-18 MED ORDER — ENSURE PLUS HIGH PROTEIN PO LIQD
237.0000 mL | Freq: Three times a day (TID) | ORAL | Status: AC
  Administered 2024-07-19 – 2024-10-02 (×208): 237 mL via ORAL

## 2024-07-18 MED ORDER — AMIODARONE HCL 200 MG PO TABS: 400.0000 mg | ORAL_TABLET | Freq: Every day | ORAL | Status: DC

## 2024-07-18 MED ORDER — MAGNESIUM HYDROXIDE 400 MG/5ML PO SUSP
30.0000 mL | Freq: Every day | ORAL | Status: AC | PRN
  Administered 2024-08-06 – 2024-08-08 (×2): 30 mL via ORAL
  Filled 2024-07-18 (×2): qty 30

## 2024-07-18 MED ORDER — PANTOPRAZOLE SODIUM 40 MG PO TBEC
40.0000 mg | DELAYED_RELEASE_TABLET | Freq: Every day | ORAL | Status: DC
  Administered 2024-07-18: 40 mg via ORAL
  Filled 2024-07-18: qty 1

## 2024-07-18 MED ORDER — OLANZAPINE 10 MG IM SOLR
5.0000 mg | Freq: Three times a day (TID) | INTRAMUSCULAR | Status: AC | PRN
Start: 2024-07-18 — End: ?

## 2024-07-18 MED ORDER — TRAZODONE HCL 50 MG PO TABS
50.0000 mg | ORAL_TABLET | Freq: Every evening | ORAL | Status: AC | PRN
  Administered 2024-07-20 – 2024-10-02 (×63): 50 mg via ORAL
  Filled 2024-07-18 (×61): qty 1

## 2024-07-18 MED ORDER — DIGOXIN 125 MCG PO TABS
0.1250 mg | ORAL_TABLET | Freq: Every day | ORAL | Status: DC
  Filled 2024-07-18: qty 1

## 2024-07-18 MED ORDER — METOPROLOL TARTRATE 25 MG PO TABS
25.0000 mg | ORAL_TABLET | Freq: Two times a day (BID) | ORAL | Status: DC
  Administered 2024-07-18: 25 mg via ORAL
  Filled 2024-07-18: qty 1

## 2024-07-18 MED ORDER — BUSPIRONE HCL 5 MG PO TABS
5.0000 mg | ORAL_TABLET | Freq: Three times a day (TID) | ORAL | Status: DC
  Administered 2024-07-18: 5 mg via ORAL
  Filled 2024-07-18: qty 1

## 2024-07-18 MED ORDER — ARIPIPRAZOLE 15 MG PO TABS: 30.0000 mg | ORAL_TABLET | Freq: Every day | ORAL | Status: DC

## 2024-07-18 MED ORDER — OLANZAPINE 5 MG PO TBDP
5.0000 mg | ORAL_TABLET | Freq: Three times a day (TID) | ORAL | Status: AC | PRN
Start: 2024-07-18 — End: ?
  Administered 2024-08-04: 5 mg via ORAL
  Filled 2024-07-18: qty 1

## 2024-07-18 MED ORDER — DIGOXIN 125 MCG PO TABS: 0.1250 mg | ORAL_TABLET | Freq: Every day | ORAL | Status: DC

## 2024-07-18 MED ORDER — DIGOXIN 125 MCG PO TABS
0.1250 mg | ORAL_TABLET | ORAL | Status: DC
  Filled 2024-07-18: qty 1

## 2024-07-18 MED ORDER — DOCUSATE SODIUM 50 MG/5ML PO LIQD
100.0000 mg | Freq: Two times a day (BID) | ORAL | Status: DC
  Administered 2024-07-18: 100 mg via ORAL
  Filled 2024-07-18: qty 10

## 2024-07-18 MED ORDER — TRAMADOL HCL 50 MG PO TABS
50.0000 mg | ORAL_TABLET | Freq: Two times a day (BID) | ORAL | Status: DC
  Administered 2024-07-18: 50 mg via ORAL
  Filled 2024-07-18: qty 1

## 2024-07-18 MED ORDER — BISACODYL 10 MG RE SUPP: 10.0000 mg | Freq: Every day | RECTAL | Status: DC | PRN

## 2024-07-18 NOTE — ED Notes (Signed)
 Pt wanted to take a shower at this time, informed pt showers are done in the morning.

## 2024-07-18 NOTE — ED Notes (Signed)
 Pt provided breakfast.

## 2024-07-18 NOTE — Progress Notes (Signed)
 Admission Note:  73 yr male who presents IVC in no acute distress for the treatment of schizophrenia. Pt was anxious and defensive with admission process. Questioning and restating 'Why am I here?' Pt presents with word salad and talking about contracts in triage. Pt denies SI/HI/AVH . Pt has Past medical Hx of CHF, AKI, shock liver and acute metabolic encephalopathy.  Skin was assessed and found to have stage 3 sacral wound and healing wounds on both feet. Discussed wound care and not laying on back while in bed. Additional blanket given for wedge. PT searched and no contraband found, POC and unit policies explained and understanding verbalized. Refused consents at this time. Patient ate dinner and completed menu for tomorrow. Pt had no additional questions or concerns

## 2024-07-18 NOTE — Tx Team (Signed)
 Initial Treatment Plan 07/18/2024 5:47 PM Miguel [name] FMW:[id number]    PATIENT STRESSORS: Educational concerns   Health problems   Medication change or noncompliance   Other: Stated transitional housing concerns     PATIENT STRENGTHS: Manufacturing systems engineer  Religious Affiliation    PATIENT IDENTIFIED PROBLEMS: Sacral wound  Transitional housing                   DISCHARGE CRITERIA:  Ability to meet basic life and health needs Safe-care adequate arrangements made  PRELIMINARY DISCHARGE PLAN: Placement in alternative living arrangements Return to previous living arrangement  PATIENT/FAMILY INVOLVEMENT: This treatment plan has been presented to and reviewed with the patient, Miguel [name].  The patient has been given the opportunity to ask questions and make suggestions.  Nicanor ELINORE Grapes, RN 07/18/2024, 5:47 PM

## 2024-07-18 NOTE — ED Notes (Addendum)
 Pt stating his bandage on sacrum is wet and needs a new one. Sacral pad placed on pt sacrum due to healing pressure wound.

## 2024-07-18 NOTE — Consult Note (Signed)
 WOC Nurse Consult Note: Reason for Consult:  new patient with leaking, chronic sacral wound  Wound type: Stage 3 Pressure Injury Pressure Injury POA: Yes/No/NA Measurement: Wound bed: Drainage (amount, consistency, odor)  Periwound: Dressing procedure/placement/frequency: Cleanse sacral wound with saline, pat dry. Pack wound with silver hydrofiber Soila # [phone]) - would order 5 from materials.  Top with dry dressing or foam Soila [phone]) -this is the sacral foam.  Change daily based on drainage. Order chair pressure redistribution pad for use when setting in chair.   Re consult if needed, will not follow at this time. Thanks  Dravin Lance [employer], RN,CWOCN, CNS, [employer] [phone]

## 2024-07-18 NOTE — ED Notes (Addendum)
 SABRA

## 2024-07-18 NOTE — Group Note (Signed)
 Date:  07/18/2024 Time:  12:26 PM  Group Topic/Focus:  Making Healthy Choices:   The focus of this group is to help patients identify negative/unhealthy choices they were using prior to admission and identify positive/healthier coping strategies to replace them upon discharge.    Participation Level:  Did Not Attend   Miguel [name] 07/18/2024, 12:26 PM

## 2024-07-18 NOTE — ED Provider Notes (Signed)
 Emergency Medicine Observation Re-evaluation Note   BP 138/71 (BP Location: Left Arm)   Pulse 67   Temp 97.9 F (36.6 C) (Oral)   Resp 16   SpO2 99%    ED Course / MDM   No reported events during my shift at the time of this note.   Pt is awaiting dispo from consultants   Ginnie Shams MD    Shams Ginnie, MD 07/18/24 [phone]

## 2024-07-18 NOTE — Group Note (Signed)
Date:  07/18/2024 Time:  4:18 PM  Group Topic/Focus:  Emotional Education:   The focus of this group is to discuss what feelings/emotions are, and how they are experienced.    Participation Level:  Did Not Attend   Arland Nutting 07/18/2024, 4:18 PM

## 2024-07-18 NOTE — Consult Note (Addendum)
 Initial Consultation Note   Patient: Miguel [name] FMW:[id number] DOB: Feb 16, 1951 PCP: Zelphia Redell Blunt, MD DOA: 07/18/2024 DOS: the patient was seen and examined on 07/18/2024 Primary service: Ruther Millie JONELLE, MD  Referring physician: [name] Reason for consult: decubitus Ulcer  Assessment/Plan: Assessment and Plan:  Schizophrenia Involuntarily Committed by Family Note from psychiatry pending at this time  Sacral Decubitus Ulcer Full thickness decubitus ulcer in setting of recent extended hospitalization, discharged from [facility] Nov 14th Wound looks pretty good overall, will check inflammatory markers.  If significantly elevated, consider CT or MRI.    WOC consult, appreciate recommendations  Cognitive Deficit Concern for anoxic brain injury at discharge 02/2024 Delirium precautions  Peripheral Vascular Disease Legs with features c/w peripheral vascular disease, diminished DP pulses Would benefit from outpatient ABI's  HFrEF Euvolemic at this time Awaiting med rec for chronic home meds Would probably be worth repeating an echo sometime in near future if hasn't recently been done at [facility]  Hx Atrial Flutter Not on any anticoagulation at this time EKG with NSR Awaiting med rec  Hypertension Noted, adjust meds gradually after med rec complete  Recently Discharged from [facility] on Nov 14th Was admitted to [facility] 01/2024 and discharged 02/2024 from [facility] to [facility] after Hospitalization for the following  Cardiac Arrest Multifactorial Shock 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  Needs chronic home meds ordered.  Awaiting med rec, brother Cherene recently picked up meds from [employer], but doesn't have the list with him when I called.   TRH will continue to follow the patient.  HPI: Miguel [name] is Miguel [name] 73 y.o. male with past medical  history of schizophrenia here under IVC from family.  He was recently discharged on November 14th from [facility] after Jamani Eley prolonged hospitalization after cardiac arrest.  He was initially admitted to [facility] in June and discharged to [facility] in 02/2024.  He was IVC'd by family after refusing to take meds and family feeling that he was not quite right.  History limited from patient due to cognitive status.    Hospitalists consulted due to the presence of Fransisco Messmer decubitus ulcer.   Review of Systems: unable to review all systems due to the inability of the patient to answer questions. Past Medical History:  Diagnosis Date   Biventricular failure (HCC)    CVA (cerebral vascular accident) (HCC)    Polysubstance use disorder    Schizophrenia (HCC)    Past Surgical History:  Procedure Laterality Date   APPENDECTOMY     TRACHEOSTOMY TUBE PLACEMENT N/Miguel [name] 03/04/2024   Procedure: CREATION, TRACHEOSTOMY;  Surgeon: [name] Massie RAMAN, MD;  Location: [facility] ORS;  Service: ENT;  Laterality: N/Lemonte Al;   Social History:  reports that he has been smoking cigarettes. He started smoking about 45 years ago. He has Miguel [name] 22.9 pack-year smoking history. He has never used smokeless tobacco. He reports that he does not drink alcohol and does not use drugs.  No Known Allergies  Family History  Family history unknown: Yes    Prior to Admission medications   Medication Sig Start Date End Date Taking? Authorizing Provider  amiodarone  (PACERONE ) 400 MG tablet Place 1 tablet (400 mg total) into feeding tube daily. 03/13/24   Shellia Inge BIRCH, NP  ARIPiprazole  (ABILIFY ) 30 MG tablet Place 1 tablet (30 mg total) into feeding tube at bedtime. 03/12/24   Shellia Inge BIRCH, NP  bisacodyl  (DULCOLAX) 10 MG suppository  Place 1 suppository (10 mg total) rectally daily as needed for moderate constipation or severe constipation. 03/12/24   Keene, Jeremiah D, NP  busPIRone  (BUSPAR ) 5 MG tablet Take 5 mg by mouth 3 (three) times daily. 07/11/24   [provider]  Chlorhexidine  Gluconate Cloth 2 % PADS Apply 6 each topically daily. 03/12/24   Keene, Jeremiah D, NP  digoxin  (LANOXIN ) 0.125 MG tablet 1 tablet (0.125 mg total) by Per NG tube route daily. 03/13/24   Keene, Jeremiah D, NP  docusate (COLACE) 50 MG/5ML liquid Place 10 mLs (100 mg total) into feeding tube 2 (two) times daily. 03/12/24   Keene, Jeremiah D, NP  metoprolol  tartrate (LOPRESSOR ) 25 MG tablet Take 25 mg by mouth 2 (two) times daily. 07/10/24   [provider]  pantoprazole  (PROTONIX ) 40 MG tablet Take 40 mg by mouth daily. 07/11/24   [provider]  traMADol  (ULTRAM ) 50 MG tablet Take 50 mg by mouth 2 (two) times daily. 07/09/24   [provider]    Physical Exam: Vitals:   07/18/24 1300  BP: (!) 159/65  Pulse: 64  Resp: 16  Temp: 97.7 F (36.5 C)  TempSrc: Oral  SpO2: 99%  Weight: 81.2 kg  Height: 6' 1 (1.854 m)   General: No acute distress. Standing with walker. Cardiovascular: RRR Lungs: unlabored Neurological: Alert, but confused. Moves all extremities 4 . Cranial nerves II through XII grossly intact. Skin: full thickness decubitus ulcer, no purulent discharge, no surrounding erythema or edema - does not appear infected  Extremities: shiny and hairless lower legs.  Diminished DP pulses, but palpable bilaterally.   Data Reviewed:   CBC with mild anemia, elevated platelets.  Negative UDS.  Elevated sed rate.  Pending CRP.      Family Communication: called brother Cherene Primary team communication: Dr. Ruther Thank you very much for involving us  in the care of your patient.  Author: Meliton Monte, MD 07/18/2024 6:50 PM  For on call review [email or web address].

## 2024-07-18 NOTE — ED Notes (Signed)
 Pt showered this morning. Clean scrubs, underwear, and socks provided. Sheets changed.

## 2024-07-19 DIAGNOSIS — S31000D Unspecified open wound of lower back and pelvis without penetration into retroperitoneum, subsequent encounter: Secondary | ICD-10-CM

## 2024-07-19 DIAGNOSIS — F209 Schizophrenia, unspecified: Secondary | ICD-10-CM

## 2024-07-19 MED ORDER — AMIODARONE HCL 200 MG PO TABS
400.0000 mg | ORAL_TABLET | Freq: Every day | ORAL | Status: AC
  Administered 2024-07-20 – 2024-10-02 (×57): 400 mg via ORAL
  Filled 2024-07-19 (×62): qty 2

## 2024-07-19 MED ORDER — METOPROLOL TARTRATE 25 MG PO TABS
25.0000 mg | ORAL_TABLET | Freq: Two times a day (BID) | ORAL | Status: DC
  Administered 2024-07-19 – 2024-08-22 (×55): 25 mg via ORAL
  Filled 2024-07-19 (×65): qty 1

## 2024-07-19 MED ORDER — OXYCODONE HCL 5 MG PO TABS
5.0000 mg | ORAL_TABLET | ORAL | Status: AC | PRN
  Administered 2024-07-20 – 2024-09-29 (×23): 5 mg via ORAL
  Filled 2024-07-19 (×24): qty 1

## 2024-07-19 MED ORDER — PANTOPRAZOLE SODIUM 40 MG PO TBEC
40.0000 mg | DELAYED_RELEASE_TABLET | Freq: Every day | ORAL | Status: AC
  Administered 2024-07-20 – 2024-10-02 (×71): 40 mg via ORAL
  Filled 2024-07-19 (×69): qty 1

## 2024-07-19 NOTE — Plan of Care (Signed)
   Problem: Education: Goal: Knowledge of [facility] Education information/materials will improve Outcome: Progressing Goal: Emotional status will improve Outcome: Progressing Goal: Mental status will improve Outcome: Progressing Goal: Verbalization of understanding the information provided will improve Outcome: Progressing

## 2024-07-19 NOTE — Group Note (Unsigned)
 [facility] LCSW Group Therapy Note    Group Date: 07/19/2024 Start Time: 1000 End Time: 1030  Type of Therapy and Topic:  Group Therapy:  Overcoming Obstacles  Participation Level:  {BHH PARTICIPATION LEVEL:22264:::1}  Mood:  Description of Group:   In this group patients will be encouraged to explore what they see as obstacles to their own wellness and recovery. They will be guided to discuss their thoughts, feelings, and behaviors related to these obstacles. The group will process together ways to cope with barriers, with attention given to specific choices patients can make. Each patient will be challenged to identify changes they are motivated to make in order to overcome their obstacles. This group will be process-oriented, with patients participating in exploration of their own experiences as well as giving and receiving support and challenge from other group members.  Therapeutic Goals: 1. Patient will identify personal and current obstacles as they relate to admission. 2. Patient will identify barriers that currently interfere with their wellness or overcoming obstacles.  3. Patient will identify feelings, thought process and behaviors related to these barriers. 4. Patient will identify two changes they are willing to make to overcome these obstacles:    Summary of Patient Progress   ***   Therapeutic Modalities:   Cognitive Behavioral Therapy Solution Focused Therapy Motivational Interviewing Relapse Prevention Therapy   Rexene LELON Mae, LCSWA

## 2024-07-19 NOTE — Group Note (Signed)
 Date:  07/19/2024 Time:  4:10 PM  Group Topic/Focus:  Managing Feelings:   The focus of this group is to identify what feelings patients have difficulty handling and develop a plan to handle them in a healthier way upon discharge.    Participation Level:  Did Not Attend  Miguel [name] 07/19/2024, 4:10 PM

## 2024-07-19 NOTE — Group Note (Signed)
 Date:  07/19/2024 Time:  8:57 PM  Group Topic/Focus:  Wrap-Up Group:   The focus of this group is to help patients review their daily goal of treatment and discuss progress on daily workbooks.    Participation Level:  Minimal  Participation Quality:  Attentive  Affect:  Anxious  Cognitive:  Alert  Insight: Limited  Engagement in Group:  Limited  Modes of Intervention:  Discussion  Additional Comments:    Miguel [name] [name] 07/19/2024, 8:57 PM

## 2024-07-19 NOTE — Progress Notes (Signed)
 Pt is disoriented to situation. He is seen having snack in dayroom and interacting appropriately on the milieu. He denies pain. He denies SI/HI/AVH. Pt reported beliefs of [facility] giving him many medications that he thinks caused his heart problems. He is unsure of why he is here. Writer attempted to orient pt, but his beliefs remain. Sacral decubitus ulcer was assessed and dressing changed at 0349. He is safe on the unit at this time with Q15 min safety checks in place.

## 2024-07-19 NOTE — Group Note (Signed)
 [facility] LCSW Group Therapy Note    Group Date: 07/19/2024 Start Time: 1000 End Time: 1030  Type of Therapy and Topic:  Group Therapy:  Overcoming Obstacles  Participation Level:  B[facility] PARTICIPATION LEVEL: Minimal  Mood: appropriate  Description of Group:   In this group patients will be encouraged to explore what they see as obstacles to their own wellness and recovery. They will be guided to discuss their thoughts, feelings, and behaviors related to these obstacles. The group will process together ways to cope with barriers, with attention given to specific choices patients can make. Each patient will be challenged to identify changes they are motivated to make in order to overcome their obstacles. This group will be process-oriented, with patients participating in exploration of their own experiences as well as giving and receiving support and challenge from other group members.  Therapeutic Goals: 1. Patient will identify personal and current obstacles as they relate to admission. 2. Patient will identify barriers that currently interfere with their wellness or overcoming obstacles.  3. Patient will identify feelings, thought process and behaviors related to these barriers. 4. Patient will identify two changes they are willing to make to overcome these obstacles:    Summary of Patient Progress The facilitator and patient discussed their thoughts, feelings and behaviors related to their personal obstacles.  The facilitator and patient identify changes they want to make.  The patient was receptive to feedback from both peers and the facilitator and contributed to creating a supportive environment, encouraging others to open up and share.     Therapeutic Modalities:   Cognitive Behavioral Therapy Solution Focused Therapy Motivational Interviewing Relapse Prevention Therapy   Rexene LELON Mae, LCSWA

## 2024-07-19 NOTE — Progress Notes (Signed)
   07/19/24 1200  Psych Admission Type (Psych Patients Only)  Admission Status Involuntary  Psychosocial Assessment  Patient Complaints None  Eye Contact Fair  Facial Expression Animated  Affect Appropriate to circumstance  Speech Tangential  Interaction Assertive  Motor Activity Slow  Appearance/Hygiene Unremarkable  Behavior Characteristics Cooperative  Mood Pleasant  Thought Process  Coherency Disorganized  Content Preoccupation;Delusions  Delusions None reported or observed  Perception WDL  Hallucination None reported or observed  Judgment Impaired  Confusion Mild  Danger to Self  Current suicidal ideation? Denies  Agreement Not to Harm Self Yes  Description of Agreement verbal  Danger to Others  Danger to Others None reported or observed   Patient refused amiodarone  and protonix .

## 2024-07-19 NOTE — Progress Notes (Signed)
  Progress Note   Patient: Miguel [name] FMW:[id number] DOB: [birth year] DOA: 07/18/2024     1 DOS: the patient was seen and examined on 07/19/2024   Brief hospital course: Patient with below mentioned past medical history at bedside and inpatient psych.  TRH consulted for wound care management  Assessment and Plan:    Schizophrenia Involuntarily Committed by Family Note from psychiatry pending at this time   Sacral Decubitus Ulcer Full thickness decubitus ulcer in setting of recent extended hospitalization, discharged from [facility] Nov 14th Wound looks pretty good overall WOC on board we appreciate input   Cognitive Deficit Concern for anoxic brain injury at discharge 02/2024 Delirium precautions   Peripheral Vascular Disease Legs with features c/w peripheral vascular disease, diminished DP pulses Would benefit from outpatient ABI's   HFrEF Euvolemic at this time Continue home medications   Hx Atrial Flutter Not on any anticoagulation at this time EKG with NSR Continue amiodarone   Hypertension Noted, adjust meds gradually after med rec complete   Subjective:  Patient seen and examined at bedside this morning Admits to improvement in the pain in the sacral area Wound appears clean and dressing intact with very nice granulation tissue  Physical Exam: General: No acute distress. Standing with walker. Cardiovascular: RRR Lungs: unlabored Neurological: Alert, but confused. Moves all extremities 4 . Cranial nerves II through XII grossly intact. Skin: full thickness decubitus ulcer, no purulent discharge, no surrounding erythema or edema - does not appear infected  Extremities: shiny and hairless lower legs.  Diminished DP pulses, but palpable bilaterally.     Vitals:   07/18/24 1300 07/18/24 1933  BP: (!) 159/65 (!) 155/71  Pulse: 64 65  Resp: 16 19  Temp: 97.7 F (36.5 C) (!) 97.2 F (36.2 C)  TempSrc: Oral   SpO2: 99% 95%  Weight: 81.2 kg   Height: 6' 1  (1.854 m)     Data Reviewed:    Latest Ref Rng & Units 07/17/2024   10:49 AM 03/12/2024    5:13 AM 03/11/2024    5:15 AM  CBC  WBC 4.0 - 10.5 K/uL 7.1  9.6  10.8   Hemoglobin 13.0 - 17.0 g/dL 88.4  8.0  8.4   Hematocrit 39.0 - 52.0 % 36.2  26.0  27.4   Platelets 150 - 400 K/uL 404  640  622        Latest Ref Rng & Units 07/17/2024   10:49 AM 03/12/2024    5:13 AM 03/11/2024    5:15 AM  BMP  Glucose 70 - 99 mg/dL 84  872  881   BUN 8 - 23 mg/dL 15  46  50   Creatinine 0.61 - 1.24 mg/dL 8.81  9.15  9.08   Sodium 135 - 145 mmol/L 140  144  145   Potassium 3.5 - 5.1 mmol/L 4.4  4.7  4.3   Chloride 98 - 111 mmol/L 104  109  110   CO2 22 - 32 mmol/L 28  31  30    Calcium  8.9 - 10.3 mg/dL 9.1  8.0  7.9      Family Communication: Nurse present at bedside and case discussed  Time spent: 42 minutes  Author: Drue ONEIDA Potter, MD 07/19/2024 4:43 PM  For on call review [email or web address].

## 2024-07-19 NOTE — Plan of Care (Signed)
   Problem: Education: Goal: Knowledge of [facility] Education information/materials will improve Outcome: Progressing   Problem: Activity: Goal: Interest or engagement in activities will improve Outcome: Progressing   Problem: Coping: Goal: Ability to verbalize frustrations and anger appropriately will improve Outcome: Progressing

## 2024-07-19 NOTE — Progress Notes (Signed)
   07/19/24 2000  Psych Admission Type (Psych Patients Only)  Admission Status Involuntary  Psychosocial Assessment  Patient Complaints Irritability  Eye Contact Brief  Facial Expression Animated  Affect Irritable;Labile  Speech Tangential  Interaction Assertive  Motor Activity Slow  Appearance/Hygiene Unremarkable;In scrubs  Behavior Characteristics Unwilling to participate  Mood Irritable  Thought Process  Coherency Disorganized  Content Preoccupation;Ambivalence  Delusions None reported or observed  Perception WDL  Hallucination UTA  Judgment Impaired  Confusion Mild  Danger to Self  Current suicidal ideation? Denies  Agreement Not to Harm Self Yes  Description of Agreement Verbal  Danger to Others  Danger to Others None reported or observed

## 2024-07-19 NOTE — [facility] Suicide Risk Assessment (Signed)
 [facility] Admission Suicide Risk Assessment   Nursing information obtained from:  Patient Demographic factors:  Age 73 or older, Low socioeconomic status, Male Current Mental Status:  NA Loss Factors:  Decline in physical health Historical Factors:  Family history of mental illness or substance abuse, Impulsivity Risk Reduction Factors:  Religious beliefs about death  Total Time spent with patient: 45 minutes Principal Problem: <principal problem not specified> Diagnosis:  Active Problems:   Schizophrenia (HCC)  Subjective Data: I do not know why I am here my brother wanted me to come here  Continued Clinical Symptoms:  Alcohol Use Disorder Identification Test Final Score (AUDIT): 0 The Alcohol Use Disorders Identification Test, Guidelines for Use in Primary Care, Second Edition.  World Science Writer [facility]). Score between 0-7:  no or low risk or alcohol related problems. Score between 8-15:  moderate risk of alcohol related problems. Score between 16-19:  high risk of alcohol related problems. Score 20 or above:  warrants further diagnostic evaluation for alcohol dependence and treatment.   CLINICAL FACTORS:   Currently Psychotic   Musculoskeletal: Strength & Muscle Tone: within normal limits Gait & Station: normal Patient leans: N/A  Psychiatric Specialty Exam:  Appearance: Disheveled, elderly male, appears older than stated age.  Behavior: Cooperative but confused; intermittent internal preoccupation.  Speech: Disorganized at times; mild word salad.  Mood: "Okay."  Affect: Restricted.  Thought Process: Tangential, loose associations, disorganized.  Thought Content: Paranoid delusions about others intending harm; denies SI/HI but reliability limited.  Perceptions: Denies hallucinations but appears to respond to internal stimuli.  Cognition: Impaired attention, memory, and executive functioning consistent with anoxic injury.  Insight: Poor.  Judgment:  Poor.   Physical Exam: Physical Exam ROS Blood pressure (!) 155/71, pulse 65, temperature (!) 97.2 F (36.2 C), resp. rate 19, height 6' 1 (1.854 m), weight 81.2 kg, SpO2 95%. Body mass index is 23.62 kg/m.   COGNITIVE FEATURES THAT CONTRIBUTE TO RISK:  Closed-mindedness    SUICIDE RISK:   Mild:  Suicidal ideation of limited frequency, intensity, duration, and specificity.  There are no identifiable plans, no associated intent, mild dysphoria and related symptoms, good self-control (both objective and subjective assessment), few other risk factors, and identifiable protective factors, including available and accessible social support.  PLAN OF CARE: 1. Level of Care  Recommend Inpatient Psychiatric Admission for stabilization, medication restart, monitoring, and coordinated medical-psychiatric management.  2. Medications  Restart home psychiatric medications once med reconciliation completed (family to bring list; pharmacy verification pending).  Initiate antipsychotic therapy consistent with previous regimen or safest alternative if unknown. Avoid agents with QT prolongation risk until EKG reviewed.  Consider low-dose atypical antipsychotic with careful cardiac monitoring (e.g., quetiapine  or olanzapine ), pending cardiology clearance.  Resume chronic cardiac medications once confirmed. Justification for multiple medications (polypharmacy) is based on multiple cardiac comorbidities and schizophrenia requiring dual management.  3. Medical Monitoring  Follow wound care recommendations; consult WOC RN.  Monitor vitals, cardiac status, hydration, and gait safety.  Consider repeat echocardiogram outpatient if not recently completed.  4. Safety  Continue IVC.  1:1 observation only if behavior worsens; not indicated currently.  Remove potential hazards from environment.  5. Social Work  Oncologist and family for updated psychosocial planning.  Discuss long-term  placement needs if cognitive impairment limits independent living.     Request discharge summary from [facility] and medication list from [facility].  I certify that inpatient services furnished can reasonably be expected to improve the patient's condition.  Millie JONELLE Manners, MD 07/19/2024, 11:58 AM

## 2024-07-19 NOTE — H&P (Signed)
 I. Chief Complaint  "Not taking medications, confused, hearing people plotting against him." - per family under IVC.  II. History of Present Illness  Miguel [name] is a 73 year old male with a past psychiatric history significant for schizophrenia who presents under involuntary commitment initiated by family due to medication non-adherence, increasing paranoia, and behavioral disorganization. 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  Since discharge home, family reports a 1-4 week period of progressive decline including refusal of all prescribed medications, worsening confusion, disorganized speech described as "word salad," and escalating paranoid delusions. Brother reports multiple episodes of patient whispering through the night, stating people were talking about him and planning to harm him. Patient was advised by his ACT Team to come to the ED; he refused, prompting the family to pursue IVC.  On evaluation today, patient is calm but disoriented, with reduced insight. He denies auditory or visual hallucinations at present, though collateral information strongly contradicts this. He denies suicidal or homicidal ideation. Unable to provide reliable history due to cognitive impairment and disorganized thought process.  Medical history is significant for schizophrenia, chronic heart failure with reduced EF, atrial flutter (not currently on anticoagulation), peripheral vascular disease, hypertension, cognitive deficits from suspected anoxic brain injury, and a sacral decubitus ulcer (recent LTAC course). Poor adherence to chronic home medications is confirmed; med list unavailable during interview.  Given the patient's psychiatric decompensation, impaired insight, impaired self-care, and medication refusal  in the context of severe medical comorbidities, inpatient psychiatric admission is medically necessary.  III. Past Psychiatric History  Diagnosis: Schizophrenia  Prior hospitalizations: Multiple; most recent extended stay 01/2024-07/10/2024 in [facility] then [facility]  Past treatments: Antipsychotics historically (regimen unclear pending med reconciliation)  Outpatient Care: ACT Team involved  Medication adherence: Poor; currently refusing all medications  Past suicide attempts: None reported  History of violence: None reported  Substance use: Denies; no evidence of acute intoxication  IV. Medical History  Cardiac arrest (01/2024)  Multifactorial shock  Acute on chronic biventricular heart failure (HFrEF)  Atrial flutter, not anticoagulated currently  Hypertension  Peripheral vascular disease  Cognitive impairment due to suspected anoxic brain injury  Sacral decubitus ulcer, full thickness, improving  Recent hospitalization and LTAC stay ending 11/14  V. Surgical History  None reported.  VI. Family History  Non-contributory for psychiatric or medical illness per available information.  VII. Social History / SDOH  Lives with brother, who is primary caregiver  Dependent for medication management  Limited ability to manage ADLs independently  Not working; disabled  No access to weapons  Stable housing but caregiver overwhelmed  VIII. ROS (as obtained)  Patient denies: chest pain, dyspnea, headache, vision changes, dysuria, abdominal pain, N/V/D, weakness, or numbness. History limited by cognitive impairment.  IX. Physical Examination  Vital Signs: Reviewed and stable (include actual values if available).  General: Elderly male, disheveled but not in acute distress.  HEENT: Normocephalic, atraumatic.  Heart: Normal rate and rhythm; no murmurs, rubs, or gallops.  Lungs: Normal respiratory effort, non-labored breathing; lungs clear to  auscultation.  Abdomen: Soft, non-tender.  Extremities: No edema; diminished DP pulses consistent with peripheral vascular disease.  Skin: Sacral decubitus ulcer present, appears clean without purulence; continue WOC recommendations.  Neurologic:  Grossly alert but oriented only to self.  No focal deficits.  Normal gait observed when ambulating short distance with supervision.  X. Mental Status  Examination  Appearance: Disheveled, elderly male, appears older than stated age.  Behavior: Cooperative but confused; intermittent internal preoccupation.  Speech: Disorganized at times; mild word salad.  Mood: "Okay."  Affect: Restricted.  Thought Process: Tangential, loose associations, disorganized.  Thought Content: Paranoid delusions about others intending harm; denies SI/HI but reliability limited.  Perceptions: Denies hallucinations but appears to respond to internal stimuli.  Cognition: Impaired attention, memory, and executive functioning consistent with anoxic injury.  Insight: Poor.  Judgment: Poor.  XI. Suicide Risk Assessment (Required)  Risk Factors:  Psychosis  Cognitive impairment  Medication refusal  Recent major medical illness  Poor self-care  Protective Factors:  Supportive brother  Housing stability  Denies SI  Current Risk Rating: Low-to-Moderate due to unreliable self-report and impaired judgment, requiring inpatient psychiatric stabilization.  XII. Labs, Imaging, and Medical Review  Labs reviewed: CBC, CMP, troponin, BNP, TSH, UA as available.  No acute abnormalities identified that would preclude psychiatric admission (pending final ED clearance).  Recommend inflammatory markers (CRP/ESR) per wound care plan; consider CT/MRI if elevated.  Recommend obtaining [facility] discharge summary and updated medication list from [facility] pharmacy.  XIII. Assessment / Diagnosis  Primary Psychiatric Diagnoses:  Schizophrenia, acute  exacerbation  Medication non-adherence  Cognitive impairment due to suspected anoxic brain injury  Medical Diagnoses Relevant to Care:  Heart failure with reduced EF  Atrial flutter  Peripheral vascular disease  Hypertension  Sacral decubitus ulcer  History of cardiac arrest  Recent prolonged hospitalization and LTAC stay  XIV. Medical Necessity & Justification for Inpatient Admission  Patient meets criteria for inpatient psychiatric admission due to:  Worsening psychosis and disorganized behavior  Impaired ability to care for self  Lack of insight into serious medical conditions requiring daily medication  Medication refusal placing him at high medical risk  Increased paranoia with behavior concerning for potential harm or misinterpretations  Cognitive deficits impairing outpatient management  Need for diagnostic clarity, medication restart, and close monitoring  Without inpatient admission, patient remains at significant risk for medical deterioration, worsening psychosis, and inability to ensure safety.  XV. Treatment Plan 1. Level of Care  Recommend Inpatient Psychiatric Admission for stabilization, medication restart, monitoring, and coordinated medical-psychiatric management.  2. Medications  Restart home psychiatric medications once med reconciliation completed (family to bring list; pharmacy verification pending).  Initiate antipsychotic therapy consistent with previous regimen or safest alternative if unknown. Avoid agents with QT prolongation risk until EKG reviewed.  Consider low-dose atypical antipsychotic with careful cardiac monitoring (e.g., quetiapine  or olanzapine ), pending cardiology clearance.  Resume chronic cardiac medications once confirmed. Justification for multiple medications (polypharmacy) is based on multiple cardiac comorbidities and schizophrenia requiring dual management.  3. Medical Monitoring  Follow wound care  recommendations; consult WOC RN.  Monitor vitals, cardiac status, hydration, and gait safety.  Consider repeat echocardiogram outpatient if not recently completed.  4. Safety  Continue IVC.  1:1 observation only if behavior worsens; not indicated currently.  Remove potential hazards from environment.  5. Social Work  Oncologist and family for updated psychosocial planning.  Discuss long-term placement needs if cognitive impairment limits independent living.

## 2024-07-20 DIAGNOSIS — F03918 Unspecified dementia, unspecified severity, with other behavioral disturbance: Secondary | ICD-10-CM | POA: Insufficient documentation

## 2024-07-20 DIAGNOSIS — L89159 Pressure ulcer of sacral region, unspecified stage: Secondary | ICD-10-CM

## 2024-07-20 DIAGNOSIS — F200 Paranoid schizophrenia: Secondary | ICD-10-CM | POA: Insufficient documentation

## 2024-07-20 MED ORDER — ADULT MULTIVITAMIN W/MINERALS CH
1.0000 | ORAL_TABLET | Freq: Every day | ORAL | Status: DC
  Administered 2024-07-20 – 2024-07-26 (×7): 1 via ORAL
  Filled 2024-07-20 (×7): qty 1

## 2024-07-20 MED ORDER — ARIPIPRAZOLE 5 MG PO TABS
10.0000 mg | ORAL_TABLET | Freq: Every day | ORAL | Status: DC
  Administered 2024-07-20 – 2024-07-22 (×3): 10 mg via ORAL
  Filled 2024-07-20 (×3): qty 2

## 2024-07-20 MED ORDER — ZINC SULFATE 220 (50 ZN) MG PO CAPS
220.0000 mg | ORAL_CAPSULE | Freq: Every day | ORAL | Status: DC
  Administered 2024-07-20 – 2024-07-26 (×7): 220 mg via ORAL
  Filled 2024-07-20 (×9): qty 1

## 2024-07-20 MED ORDER — VITAMIN C 500 MG PO TABS
500.0000 mg | ORAL_TABLET | Freq: Two times a day (BID) | ORAL | Status: DC
  Administered 2024-07-20 – 2024-07-29 (×19): 500 mg via ORAL
  Filled 2024-07-20 (×19): qty 1

## 2024-07-20 NOTE — Group Note (Signed)
 Recreation Therapy Group Note   Group Topic:Emotion Expression  Group Date: 07/20/2024 Start Time: 1510 End Time: 1615 Facilitators: Celestia Jeoffrey BRAVO, LRT, CTRS Location: Courtyard  Group Description: Music. Patients are encouraged to name their favorite song(s) for LRT to play song through speaker for group to hear, while in the courtyard getting fresh air and sunlight. Patients educated on the definition of leisure and the importance of having different leisure interests outside of the hospital. Group discussed how leisure activities can often be used as pharmacologist and that listening to music and being outside are examples.    Goal Area(s) Addressed:  Patient will identify a current leisure interest.  Patient will practice making a positive decision. Patient will have the opportunity to try a new leisure activity.   Affect/Mood: Appropriate   Participation Level: Active and Engaged   Participation Quality: Independent   Behavior: Calm and Cooperative   Speech/Thought Process: Coherent   Insight: Fair   Judgement: Fair    Modes of Intervention: Guided Discussion and Music   Patient Response to Interventions:  Attentive, Engaged, and Receptive   Education Outcome:  Acknowledges education   Clinical Observations/Individualized Feedback: Miguel [name] was active in their participation of session activities and group discussion. Pt was noted to be singing along to the music being played. Pt interacted well with LRT and peers duration of session.    Plan: Continue to engage patient in RT group sessions 2-3x/week.   [address] LRT, CTRS 07/20/2024 5:27 PM

## 2024-07-20 NOTE — Group Note (Signed)
 Date:  07/20/2024 Time:  5:36 PM  Group Topic/Focus:  Managing Feelings:   The focus of this group is to identify what feelings patients have difficulty handling and develop a plan to handle them in a healthier way upon discharge.    Participation Level:  Did Not Attend  Participation Quality:    Affect:    Cognitive:    Insight:   Engagement in Group:    Modes of Intervention:    Additional Comments:    Garen CINDERELLA Daring 07/20/2024, 5:36 PM

## 2024-07-20 NOTE — Progress Notes (Signed)
   07/20/24 1200  Psych Admission Type (Psych Patients Only)  Admission Status Involuntary  Psychosocial Assessment  Patient Complaints Irritability  Eye Contact Brief  Facial Expression Animated  Affect Labile  Speech Tangential  Interaction Assertive  Motor Activity Slow  Appearance/Hygiene In scrubs  Behavior Characteristics Unwilling to participate  Mood Irritable  Thought Process  Coherency Disorganized  Content Blaming others  Delusions None reported or observed  Perception WDL  Hallucination None reported or observed  Judgment Impaired  Confusion Mild  Danger to Self  Current suicidal ideation? Denies  Agreement Not to Harm Self Yes  Description of Agreement verbal  Danger to Others  Danger to Others None reported or observed

## 2024-07-20 NOTE — Group Note (Signed)
 Date:  07/20/2024 Time:  10:41 PM  Group Topic/Focus:  Wrap-Up Group:   The focus of this group is to help patients review their daily goal of treatment and discuss progress on daily workbooks.    Participation Level:  Did Not Attend  Participation Quality:     Affect:     Cognitive:     Insight: None  Engagement in Group:  None  Modes of Intervention:     Additional Comments:    Miguel [name] [name] 07/20/2024, 10:41 PM

## 2024-07-20 NOTE — Plan of Care (Signed)
  Problem: Education: Goal: Knowledge of [facility] Education information/materials will improve Outcome: Not Progressing   Problem: Education: Goal: Mental status will improve Outcome: Not Progressing

## 2024-07-20 NOTE — Plan of Care (Signed)
   Problem: Health Behavior/Discharge Planning: Goal: Compliance with treatment plan for underlying cause of condition will improve Outcome: Progressing   Problem: Safety: Goal: Periods of time without injury will increase Outcome: Progressing

## 2024-07-20 NOTE — Progress Notes (Signed)
 [facility] MD Progress Note  07/20/2024 1:20 PM Miguel [name]  MRN:  [id number]  Miguel [name]. [name] is a 73 year old male with a past psychiatric history significant for schizophrenia who presents under involuntary commitment initiated by family due to medication non-adherence, increasing paranoia, and behavioral disorganization. 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 Brother reports multiple episodes of patient whispering through the night, stating people were talking about him and planning to harm him. Patient was advised by his ACT Team to come to the ED; he refused, prompting the family to pursue IVC.Patient is admitted to [facility] unit with Q15 min safety monitoring. Multidisciplinary team approach is offered. Medication management; group/milieu therapy is offered.  Subjective:  Chart reviewed, case discussed in multidisciplinary meeting, patient seen during rounds.   Patient met with the treatment team today.  Patient is oriented to only self.  He is unable to answer the orientation questions regarding to the current location, reason for the visit.  She reports that 2 women came home and told that there were a take him to the shopping and brought him here.  He is unable to acknowledge the history of psychiatric disorder and the current medications.  When asked about history of stroke and recent hospitalization and rehab, patient is unable to acknowledge any of that hospitalization.  He denies feeling depressed or anxious.  He denies SI/HI/plan and denies hallucinations.  Past  Psychiatric History: see h&P Family History:  Family History  Family history unknown: Yes   Social History:  Social History   Substance and Sexual Activity  Alcohol Use No     Social History   Substance and Sexual Activity  Drug Use No    Social History   Socioeconomic History   Marital status: Divorced    Spouse name: Not on file   Number of children: Not on file   Years of education: Not on file   Highest education level: Not on file  Occupational History   Not on file  Tobacco Use   Smoking status: Every Day    Current packs/day: 0.50    Average packs/day: 0.5 packs/day for 45.9 years (22.9 ttl pk-yrs)    Types: Cigarettes    Start date: 39   Smokeless tobacco: Never  Vaping Use   Vaping status: Never Used  Substance and Sexual Activity   Alcohol use: No   Drug use: No   Sexual activity: Not on file  Other Topics Concern   Not on file  Social History Narrative   Not on file   Social Drivers of Health   Financial Resource Strain: Not on file  Food Insecurity: Patient Unable To Answer (07/18/2024)   Hunger Vital Sign    Worried About Running Out of Food in the Last Year: Patient unable to answer    Ran Out of Food in the Last Year: Patient unable to answer  Transportation Needs: Unmet Transportation Needs (07/18/2024)   PRAPARE - Administrator, Civil Service (Medical): Yes    Lack of Transportation (Non-Medical): Yes  Physical Activity: Not on file  Stress: Not on file  Social Connections: Patient Unable To Answer (07/18/2024)   Social Connection and Isolation Panel  Frequency of Communication with Friends and Family: Patient unable to answer    Frequency of Social Gatherings with Friends and Family: Patient unable to answer    Attends Religious Services: Patient unable to answer    Active Member of Clubs or Organizations: Patient unable to answer    Attends Banker Meetings: Patient unable to answer    Marital Status:  Patient unable to answer   Past Medical History:  Past Medical History:  Diagnosis Date   Biventricular failure (HCC)    CVA (cerebral vascular accident) (HCC)    Polysubstance use disorder    Schizophrenia (HCC)     Past Surgical History:  Procedure Laterality Date   APPENDECTOMY     TRACHEOSTOMY TUBE PLACEMENT N/A 03/04/2024   Procedure: CREATION, TRACHEOSTOMY;  Surgeon: [name] Massie RAMAN, MD;  Location: [facility] ORS;  Service: ENT;  Laterality: N/A;    Current Medications: Current Facility-Administered Medications  Medication Dose Route Frequency Provider Last Rate Last Admin   acetaminophen  (TYLENOL ) tablet 650 mg  650 mg Oral Q6H PRN Hampton, Tracie B, NP       alum & mag hydroxide-simeth (MAALOX/MYLANTA) 200-200-20 MG/5ML suspension 30 mL  30 mL Oral Q4H PRN Hampton, Tracie B, NP       amiodarone  (PACERONE ) tablet 400 mg  400 mg Oral Daily [name], [name], MD   400 mg at 07/20/24 1015   ARIPiprazole  (ABILIFY ) tablet 10 mg  10 mg Oral Daily Avraham Benish, MD       ascorbic acid  (VITAMIN C ) tablet 500 mg  500 mg Oral BID [name], [name], MD   500 mg at 07/20/24 1316   feeding supplement (ENSURE PLUS HIGH PROTEIN) liquid 237 mL  237 mL Oral TID BM [name], [name], MD   237 mL at 07/20/24 1024   hydrOXYzine  (ATARAX ) tablet 25 mg  25 mg Oral TID PRN Hampton, Tracie B, NP       magnesium  hydroxide (MILK OF MAGNESIA) suspension 30 mL  30 mL Oral Daily PRN Hampton, Tracie B, NP       metoprolol  tartrate (LOPRESSOR ) tablet 25 mg  25 mg Oral BID [name], [name], MD   25 mg at 07/20/24 1015   multivitamin with minerals tablet 1 tablet  1 tablet Oral Daily [name], [name], MD   1 tablet at 07/20/24 1317   OLANZapine  (ZYPREXA ) injection 5 mg  5 mg Intramuscular TID PRN Hampton, Tracie B, NP       OLANZapine  zydis (ZYPREXA ) disintegrating tablet 5 mg  5 mg Oral TID PRN Hampton, Tracie B, NP       oxyCODONE  (Oxy IR/ROXICODONE ) immediate release tablet 5 mg  5 mg Oral Q4H PRN Dorinda Drue DASEN, MD        pantoprazole  (PROTONIX ) EC tablet 40 mg  40 mg Oral Daily [name], [name], MD   40 mg at 07/20/24 1015   traZODone  (DESYREL ) tablet 50 mg  50 mg Oral QHS PRN Hampton, Tracie B, NP       zinc  sulfate (50mg  elemental zinc ) capsule 220 mg  220 mg Oral Daily [name], [name], MD   220 mg at 07/20/24 1317    Lab Results:  Results for orders placed or performed during the hospital encounter of 07/18/24 (from the past 48 hours)  C-reactive protein     Status: None   Collection Time: 07/18/24  3:15 PM  Result Value Ref Range   CRP 0.6 <1.0 mg/dL    Comment: Performed  at [facility], [address], [place], [place] [address]  Sedimentation rate     Status: Abnormal   Collection Time: 07/18/24  3:15 PM  Result Value Ref Range   Sed Rate 67 (H) 0 - 20 mm/hr    Comment: Performed at [facility], [address] [place], [place] [address]    Blood Alcohol level:  Lab Results  Component Value Date   [facility] <15 07/17/2024   ETH <15 02/20/2024    Metabolic Disorder Labs: Lab Results  Component Value Date   HGBA1C 5.8 (H) 07/14/2018   MPG 119.76 07/14/2018   MPG 131.24 07/13/2018   No results found for: PROLACTIN Lab Results  Component Value Date   CHOL 170 07/13/2018   TRIG 34 03/08/2024   HDL 41 07/13/2018   CHOLHDL 4.1 07/13/2018   VLDL 10 07/13/2018   LDLCALC 119 (H) 07/13/2018    Physical Findings: AIMS:  , ,  ,  ,    CIWA:    COWS:      Psychiatric Specialty Exam:  Presentation  General Appearance: Appropriate for Environment  Eye Contact:Fleeting  Speech:Normal Rate  Speech Volume:Decreased    Mood and Affect  Mood:Anxious  Affect:Flat   Thought Process  Thought Processes:Disorganized  Orientation:Partial  Thought Content:Illogical; Paranoid Ideation  Hallucinations:Hallucinations: None  Ideas of Reference:Delusions  Suicidal Thoughts:Suicidal Thoughts: No  Homicidal Thoughts:Homicidal Thoughts: No   Sensorium   Memory:Immediate Poor; Recent Poor; Remote Poor  Judgment:Impaired  Insight:None   Executive Functions  Concentration:Poor  Attention Span:Poor  Recall:Poor  Fund of Knowledge:Poor  Language:Fair   Psychomotor Activity  Psychomotor Activity:Psychomotor Activity: Normal  Musculoskeletal: Strength & Muscle Tone: within normal limits Gait & Station: normal Assets  Assets:Communication Skills; Desire for Improvement; Social Support    Physical Exam: Physical Exam Vitals and nursing note reviewed.    ROS Blood pressure (!) 155/71, pulse 65, temperature (!) 97.2 F (36.2 C), resp. rate 19, height 6' 1 (1.854 m), weight 81.2 kg, SpO2 95%. Body mass index is 23.62 kg/m.  Diagnosis: Active Problems:   Schizophrenia, paranoid (HCC)   Dementia with behavioral disturbance (HCC)   PLAN: Safety and Monitoring:  -- Involuntary admission to inpatient psychiatric unit for safety, stabilization and treatment  -- Daily contact with patient to assess and evaluate symptoms and progress in treatment  -- Patient's case to be discussed in multi-disciplinary team meeting  -- Observation Level : q15 minute checks  -- Vital signs:  q12 hours  -- Precautions: suicide, elopement, and assault -- Encouraged patient to participate in unit milieu and in scheduled group therapies  2. Psychiatric Treatment:  Scheduled Medications:  Abilify  10 mg daily is started as he was on it before= will reach out to ACT team to get list of his psychotropic medications   -- The risks/benefits/side-effects/alternatives to this medication were discussed in detail with the patient and time was given for questions. The patient consents to medication trial.  3. Medical Issues Being Addressed:  [facility] consulted for wound care management   Assessment and Plan:   Sacral Decubitus Ulcer Full thickness decubitus ulcer in setting of recent extended hospitalization, discharged from [facility] Nov 14th Wound looks  pretty good overall WOC on board we appreciate input   Cognitive Deficit Concern for anoxic brain injury at discharge 02/2024 Delirium precautions   Peripheral Vascular Disease Legs with features c/w peripheral vascular disease, diminished DP pulses Would benefit from outpatient ABI's   HFrEF Euvolemic at this time Continue  home medications   Hx Atrial Flutter Not on any anticoagulation at this time EKG with NSR Continue amiodarone   Hypertension Noted, adjust meds gradually after med rec complete     4. Discharge Planning:   -- Social work and case management to assist with discharge planning and identification of hospital follow-up needs prior to discharge  -- Estimated LOS: 3-4 days  Allyn Foil, MD 07/20/2024, 1:20 PM

## 2024-07-20 NOTE — Progress Notes (Signed)
  Progress Note   Patient: Miguel [name] FMW:[id number] DOB: 08-28-1950 DOA: 07/18/2024     2 DOS: the patient was seen and examined on 07/20/2024      Brief hospital course: Patient with below mentioned past medical history at bedside and inpatient psych.  TRH consulted for wound care management   Assessment and Plan:     Schizophrenia Involuntarily Committed by Family Note from psychiatry pending at this time   Sacral Decubitus Ulcer Full thickness decubitus ulcer in setting of recent extended hospitalization, discharged from [facility] 14th Wound looks pretty good overall WOC on board we appreciate input   Cognitive Deficit Concern for anoxic brain injury at discharge 02/2024 Delirium precautions   Peripheral Vascular Disease Legs with features c/w peripheral vascular disease, diminished DP pulses Would benefit from outpatient ABI's   HFrEF Euvolemic at this time Continue home medications   Hx Atrial Flutter Not on any anticoagulation at this time EKG with NSR Continue amiodarone   Hypertension Noted, adjust meds gradually after med rec complete     Subjective:  Patient seen and examined at bedside this morning He tells me his pain in the sacral area is much improved   Physical Exam: General: No acute distress. Standing with walker. Cardiovascular: RRR Lungs: unlabored Neurological: Alert, but confused. Moves all extremities 4 . Cranial nerves II through XII grossly intact. Skin: full thickness decubitus ulcer, no purulent discharge, no surrounding erythema or edema - does not appear infected  Extremities: shiny and hairless lower legs.  Diminished DP pulses, but palpable bilaterally.    Data Reviewed:  Vitals:   07/18/24 1300 07/18/24 1933  BP: (!) 159/65 (!) 155/71  Pulse: 64 65  Resp: 16 19  Temp: 97.7 F (36.5 C) (!) 97.2 F (36.2 C)  TempSrc: Oral   SpO2: 99% 95%  Weight: 81.2 kg   Height: 6' 1 (1.854 m)       Latest Ref Rng & Units  07/17/2024   10:49 AM 03/12/2024    5:13 AM 03/11/2024    5:15 AM  BMP  Glucose 70 - 99 mg/dL 84  872  881   BUN 8 - 23 mg/dL 15  46  50   Creatinine 0.61 - 1.24 mg/dL 8.81  9.15  9.08   Sodium 135 - 145 mmol/L 140  144  145   Potassium 3.5 - 5.1 mmol/L 4.4  4.7  4.3   Chloride 98 - 111 mmol/L 104  109  110   CO2 22 - 32 mmol/L 28  31  30    Calcium  8.9 - 10.3 mg/dL 9.1  8.0  7.9        Latest Ref Rng & Units 07/17/2024   10:49 AM 03/12/2024    5:13 AM 03/11/2024    5:15 AM  CBC  WBC 4.0 - 10.5 K/uL 7.1  9.6  10.8   Hemoglobin 13.0 - 17.0 g/dL 88.4  8.0  8.4   Hematocrit 39.0 - 52.0 % 36.2  26.0  27.4   Platelets 150 - 400 K/uL 404  640  622      Author: Drue ONEIDA Potter, MD 07/20/2024 5:21 PM  For on call review [email or web address].

## 2024-07-20 NOTE — BH IP Treatment Plan (Signed)
 Interdisciplinary Treatment and Diagnostic Plan Update  07/20/2024 Time of Session: 9:38 AM Miguel [name] MRN: [id number]  Principal Diagnosis: <principal problem not specified>  Secondary Diagnoses: Active Problems:   Schizophrenia (HCC)   Current Medications:  Current Facility-Administered Medications  Medication Dose Route Frequency Provider Last Rate Last Admin   acetaminophen  (TYLENOL ) tablet 650 mg  650 mg Oral Q6H PRN Hampton, Tracie B, NP       alum & mag hydroxide-simeth (MAALOX/MYLANTA) 200-200-20 MG/5ML suspension 30 mL  30 mL Oral Q4H PRN Hampton, Tracie B, NP       amiodarone  (PACERONE ) tablet 400 mg  400 mg Oral Daily [name], Drue DASEN, MD       feeding supplement (ENSURE PLUS HIGH PROTEIN) liquid 237 mL  237 mL Oral TID BM [name], [name], MD   237 mL at 07/19/24 2156   hydrOXYzine  (ATARAX ) tablet 25 mg  25 mg Oral TID PRN Hampton, Tracie B, NP       magnesium  hydroxide (MILK OF MAGNESIA) suspension 30 mL  30 mL Oral Daily PRN Hampton, Tracie B, NP       metoprolol  tartrate (LOPRESSOR ) tablet 25 mg  25 mg Oral BID [name], [name], MD   25 mg at 07/19/24 2156   OLANZapine  (ZYPREXA ) injection 5 mg  5 mg Intramuscular TID PRN Hampton, Tracie B, NP       OLANZapine  zydis (ZYPREXA ) disintegrating tablet 5 mg  5 mg Oral TID PRN Hampton, Tracie B, NP       oxyCODONE  (Oxy IR/ROXICODONE ) immediate release tablet 5 mg  5 mg Oral Q4H PRN [name], [name], MD       pantoprazole  (PROTONIX ) EC tablet 40 mg  40 mg Oral Daily [name], [name], MD       traZODone  (DESYREL ) tablet 50 mg  50 mg Oral QHS PRN Hampton, Tracie B, NP       PTA Medications: Medications Prior to Admission  Medication Sig Dispense Refill Last Dose/Taking   amiodarone  (PACERONE ) 400 MG tablet Place 1 tablet (400 mg total) into feeding tube daily.      ARIPiprazole  (ABILIFY ) 30 MG tablet Place 1 tablet (30 mg total) into feeding tube at bedtime.      bisacodyl  (DULCOLAX) 10 MG suppository Place 1 suppository (10 mg  total) rectally daily as needed for moderate constipation or severe constipation.      busPIRone  (BUSPAR ) 5 MG tablet Take 5 mg by mouth 3 (three) times daily.      Chlorhexidine  Gluconate Cloth 2 % PADS Apply 6 each topically daily.      digoxin  (LANOXIN ) 0.125 MG tablet 1 tablet (0.125 mg total) by Per NG tube route daily. (Patient taking differently: Take 0.125 mg by mouth every other day.)      docusate (COLACE) 50 MG/5ML liquid Place 10 mLs (100 mg total) into feeding tube 2 (two) times daily.      metoprolol  tartrate (LOPRESSOR ) 25 MG tablet Take 25 mg by mouth 2 (two) times daily.      pantoprazole  (PROTONIX ) 40 MG tablet Take 40 mg by mouth daily.      traMADol  (ULTRAM ) 50 MG tablet Take 50 mg by mouth 2 (two) times daily.       Patient Stressors: Educational concerns   Health problems   Medication change or noncompliance   Other: Stated transitional housing concerns    Patient Strengths: Manufacturing systems engineer  Religious Affiliation   Treatment Modalities: Medication Management, Group therapy, Case management,  1  to 1 session with clinician, Psychoeducation, Recreational therapy.   Physician Treatment Plan for Primary Diagnosis: <principal problem not specified> Long Term Goal(s):     Short Term Goals:    Medication Management: Evaluate patient's response, side effects, and tolerance of medication regimen.  Therapeutic Interventions: 1 to 1 sessions, Unit Group sessions and Medication administration.  Evaluation of Outcomes: Not Met  Physician Treatment Plan for Secondary Diagnosis: Active Problems:   Schizophrenia (HCC)  Long Term Goal(s):     Short Term Goals:       Medication Management: Evaluate patient's response, side effects, and tolerance of medication regimen.  Therapeutic Interventions: 1 to 1 sessions, Unit Group sessions and Medication administration.  Evaluation of Outcomes: Not Met   RN Treatment Plan for Primary Diagnosis: <principal problem not  specified> Long Term Goal(s): Knowledge of disease and therapeutic regimen to maintain health will improve  Short Term Goals: Ability to verbalize frustration and anger appropriately will improve, Ability to demonstrate self-control, Ability to participate in decision making will improve, Ability to verbalize feelings will improve, Ability to disclose and discuss suicidal ideas, and Ability to identify and develop effective coping behaviors will improve  Medication Management: RN will administer medications as ordered by provider, will assess and evaluate patient's response and provide education to patient for prescribed medication. RN will report any adverse and/or side effects to prescribing provider.  Therapeutic Interventions: 1 on 1 counseling sessions, Psychoeducation, Medication administration, Evaluate responses to treatment, Monitor vital signs and CBGs as ordered, Perform/monitor CIWA, COWS, AIMS and Fall Risk screenings as ordered, Perform wound care treatments as ordered.  Evaluation of Outcomes: Not Met   LCSW Treatment Plan for Primary Diagnosis: <principal problem not specified> Long Term Goal(s): Safe transition to appropriate next level of care at discharge, Engage patient in therapeutic group addressing interpersonal concerns.  Short Term Goals: Engage patient in aftercare planning with referrals and resources, Increase social support, Increase ability to appropriately verbalize feelings, Increase emotional regulation, Facilitate acceptance of mental health diagnosis and concerns, Facilitate patient progression through stages of change regarding substance use diagnoses and concerns, Identify triggers associated with mental health/substance abuse issues, and Increase skills for wellness and recovery  Therapeutic Interventions: Assess for all discharge needs, 1 to 1 time with Social worker, Explore available resources and support systems, Assess for adequacy in community support  network, Educate family and significant other(s) on suicide prevention, Complete Psychosocial Assessment, Interpersonal group therapy.  Evaluation of Outcomes: Not Met   Progress in Treatment: Attending groups: Yes. and No. Participating in groups: Yes. and No. Taking medication as prescribed: Yes. Toleration medication: Yes. Family/Significant other contact made: No, will contact:  CSW to contact once permission is granted.  Patient understands diagnosis: Yes. Discussing patient identified problems/goals with staff: Yes. Medical problems stabilized or resolved: Yes. Denies suicidal/homicidal ideation: Yes. Issues/concerns per patient self-inventory: No. Other: None  New problem(s) identified: No, Describe:  None  New Short Term/Long Term Goal(s):detox, elimination of symptoms of psychosis, medication management for mood stabilization; elimination of SI thoughts; development of comprehensive mental wellness/sobriety plan.    Patient Goals:  Make sure everything is settled.   Discharge Plan or Barriers: CSW to assist with the development of appropriate discharge plan.    Reason for Continuation of Hospitalization: Aggression Anxiety Delusions  Depression Hallucinations Suicidal ideation  Estimated Length of Stay: 1-7 days.   Last 3 [place] Suicide Severity Risk Score: Flowsheet Row Admission (Current) from 07/18/2024 in [facility] ED from 07/17/2024 in  [place] [facility] ED to Hosp-Admission (Discharged) from 02/20/2024 in [facility] ICU/CCU  C-SSRS RISK CATEGORY No Risk No Risk No Risk    Last PHQ 2/9 Scores:     No data to display          Scribe for Treatment Team: Emry Barbato M Hilary Pundt, LCSW 07/20/2024 10:05 AM

## 2024-07-20 NOTE — Progress Notes (Signed)
   07/20/24 2000  Psych Admission Type (Psych Patients Only)  Admission Status Involuntary  Psychosocial Assessment  Patient Complaints Irritability  Eye Contact Brief  Facial Expression Animated  Affect Labile  Speech Tangential  Interaction Assertive  Motor Activity Slow  Appearance/Hygiene In scrubs  Behavior Characteristics Unwilling to participate  Mood Irritable  Thought Process  Coherency Disorganized  Content Blaming others  Delusions None reported or observed  Perception WDL  Hallucination None reported or observed  Judgment Impaired  Confusion Mild  Danger to Self  Current suicidal ideation? Denies  Agreement Not to Harm Self Yes  Description of Agreement verbal  Danger to Others  Danger to Others None reported or observed

## 2024-07-20 NOTE — Progress Notes (Signed)
 NUTRITION ASSESSMENT  Pt identified as at risk on the Malnutrition Screen Tool  INTERVENTION:  -Liberalize diet to 2 gram sodium for wider variety of meal selections -MVI with minerals daily -Ensure Plus High Protein po TID, each supplement provides 350 kcal and 20 grams of protein  -500 mg vitamin C  BID -220 gm zinc  sulfate daily x 14 days   NUTRITION DIAGNOSIS: Unintentional weight loss related to sub-optimal intake as evidenced by pt report.   Goal: Pt to meet >/= 90% of their estimated nutrition needs.  Monitor:  PO intake  Assessment:  73 year old male with a past psychiatric history significant for schizophrenia who presents under involuntary commitment initiated by family due to medication non-adherence, increasing paranoia, and behavioral disorganization. 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   Patient admitted under IVC.   Per H&P, patient has experienced a general decline since discharge from [facility] on 07/10/24. Decline has included refusal of all prescribed medications, worsening confusion, disorganized speech described as "word salad," and escalating paranoid delusions.  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 Per RD notes back in 02/2024 patient had trach on 03/04/24 and was dependent on NGT for feeding. He also had skin breakdown during that time. Patient no longer requires nutrition support and consumes a PO diet only.   Patient currently on a heart healthy diet. No meal completion data available to assess at this time.   Reviewed weight history. Patient has experienced a 12.2% weight loss over the past 4 months, but unsure of accuracy of weights. Due to patient history of CHF,  suspect weight fluctuations secondary to fluid changes. Patient is at high risk for malnutrition secondary to multiple co morbidities and increased needs due to pressure injuries.   Lab Results  Component Value Date   HGBA1C 5.8 (H) 07/14/2018   PTA DM medications are none.   Labs reviewed: CBGS: 62-118 (inpatient orders for glycemic control are none). Tox screen negative.   73 y.o. male  Height: Ht Readings from Last 1 Encounters:  07/18/24 6' 1 (1.854 m)    Weight: Wt Readings from Last 1 Encounters:  07/18/24 81.2 kg    Weight Hx: Wt Readings from Last 10 Encounters:  07/18/24 81.2 kg  03/12/24 92.5 kg  04/27/23 83.9 kg  09/15/22 80.6 kg  06/19/22 96.2 kg  02/19/22 96.2 kg  12/09/21 83.9 kg  10/09/21 93.4 kg  11/21/18 93.4 kg  07/13/18 79.4 kg    BMI:  Body mass index is 23.62 kg/m. BMI WDL.   Estimated Nutritional Needs: Kcal: 25-30 kcal/kg Protein: > 1 gram protein/kg Fluid: 1 ml/kcal  Diet Order:  Diet Order             Diet Heart Room service appropriate? Yes; Fluid consistency: Thin  Diet effective now                  Pt is also offered choice of unit snacks mid-morning and mid-afternoon.  Pt is eating as desired.   Lab results and medications reviewed.   Margery ORN, RD, LDN, CDCES Registered Dietitian III Certified Diabetes Care and Education Specialist If unable to reach this RD, please use RD Inpatient group chat on secure chat between hours of 8am-4 pm daily

## 2024-07-20 NOTE — Group Note (Signed)
 Date:  07/20/2024 Time:  10:53 AM  Group Topic/Focus:  Goals/Gratitude Group:   The focus of this group is to help patients establish daily goals to achieve during treatment and discuss how the patient can incorporate goal setting into their daily lives to aide in recovery. Also patients watched a video on gratitude and shared their thoughts on the video.    Participation Level:  None  Miguel [name] T [name] 07/20/2024, 10:53 AM

## 2024-07-21 DIAGNOSIS — L89154 Pressure ulcer of sacral region, stage 4: Secondary | ICD-10-CM | POA: Diagnosis not present

## 2024-07-21 NOTE — Group Note (Signed)
 Recreation Therapy Group Note   Group Topic:Stress Management  Group Date: 07/21/2024 Start Time: 1100 End Time: 1135 Facilitators: Celestia Jeoffrey BRAVO, LRT, CTRS Location: Dayroom  Group Description: PMR (Progressive Muscle Relaxation). LRT educates patients on what PMR is and the benefits that come from it. Patients are asked to sit with their feet flat on the floor while sitting up and all the way back in their chair, if possible. LRT and pts follow a prompt through a speaker that requires you to tense and release different muscles in their body and focus on their breathing. During session, lights are off and soft music is being played. Pts are given a stress ball to use if needed.   Goal Area(s) Addressed:  Patients will be able to describe progressive muscle relaxation.  Patient will practice using relaxation technique. Patient will identify a new coping skill.  Patient will follow multistep directions to reduce anxiety and stress.   Affect/Mood: N/A   Participation Level: Minimal    Clinical Observations/Individualized Feedback: Mohannad came late to group. Pt was present for 5 minutes.   Plan: Continue to engage patient in RT group sessions 2-3x/week.   Jeoffrey BRAVO Celestia, LRT, CTRS 07/21/2024 1:46 PM

## 2024-07-21 NOTE — Group Note (Signed)
 LCSW Group Therapy Note  Group Date: 07/21/2024 Start Time: 1300 End Time: 1340   Type of Therapy and Topic:  Group Therapy - Healthy vs Unhealthy Coping Skills  Participation Level:  Did Not Attend   Description of Group The focus of this group was to determine what unhealthy coping techniques typically are used by group members and what healthy coping techniques would be helpful in coping with various problems. Patients were guided in becoming aware of the differences between healthy and unhealthy coping techniques. Patients were asked to identify 2-3 healthy coping skills they would like to learn to use more effectively.  Therapeutic Goals Patients learned that coping is what human beings do all day long to deal with various situations in their lives Patients defined and discussed healthy vs unhealthy coping techniques Patients identified their preferred coping techniques and identified whether these were healthy or unhealthy Patients determined 2-3 healthy coping skills they would like to become more familiar with and use more often. Patients provided support and ideas to each other   Summary of Patient Progress:  X   Therapeutic Modalities Cognitive Behavioral Therapy Motivational Interviewing  [name], [place] 07/21/2024  2:31 PM

## 2024-07-21 NOTE — Progress Notes (Signed)
  Progress Note   Patient: Miguel [name] FMW:[id number] DOB: Apr 01, 1951 DOA: 07/18/2024     3 DOS: the patient was seen and examined on 07/21/2024     Brief hospital course: Patient with below mentioned past medical history currently on admission with inpatient psych.  TRH consulted for wound care management   Assessment and Plan:     Schizophrenia Involuntarily Committed by Family Note from psychiatry pending at this time   Sacral Decubitus Ulcer Full thickness decubitus ulcer in setting of recent extended hospitalization, discharged from LTAC Nov 14th Wound looks pretty good overall WOC on board we appreciate input Continue as needed pain medication   Cognitive Deficit Concern for anoxic brain injury at discharge 02/2024 Delirium precautions   Peripheral Vascular Disease Legs with features c/w peripheral vascular disease, diminished DP pulses Would benefit from outpatient ABI's   HFrEF Euvolemic at this time Continue home medications   Hx Atrial Flutter Not on any anticoagulation at this time EKG with NSR Continue amiodarone   Hypertension Continue beta-blocker     Subjective:  Patient seen and examined at bedside this morning He admits to improvement in the pain as well as his wound   Physical Exam: General: No acute distress. Standing with walker. Cardiovascular: RRR Lungs: unlabored Neurological: Alert, but confused. Moves all extremities 4 . Cranial nerves II through XII grossly intact. Skin: full thickness decubitus ulcer, no purulent discharge, no surrounding erythema or edema - does not appear infected  Extremities: shiny and hairless lower legs.  Diminished DP pulses, but palpable bilaterally.    Data Reviewed:  Vitals:   07/18/24 1300 07/18/24 1933  BP: (!) 159/65 (!) 155/71  Pulse: 64 65  Resp: 16 19  Temp: 97.7 F (36.5 C) (!) 97.2 F (36.2 C)  TempSrc: Oral   SpO2: 99% 95%  Weight: 81.2 kg   Height: 6' 1 (1.854 m)       Latest  Ref Rng & Units 07/17/2024   10:49 AM 03/12/2024    5:13 AM 03/11/2024    5:15 AM  CBC  WBC 4.0 - 10.5 K/uL 7.1  9.6  10.8   Hemoglobin 13.0 - 17.0 g/dL 88.4  8.0  8.4   Hematocrit 39.0 - 52.0 % 36.2  26.0  27.4   Platelets 150 - 400 K/uL 404  640  622        Latest Ref Rng & Units 07/17/2024   10:49 AM 03/12/2024    5:13 AM 03/11/2024    5:15 AM  BMP  Glucose 70 - 99 mg/dL 84  872  881   BUN 8 - 23 mg/dL 15  46  50   Creatinine 0.61 - 1.24 mg/dL 8.81  9.15  9.08   Sodium 135 - 145 mmol/L 140  144  145   Potassium 3.5 - 5.1 mmol/L 4.4  4.7  4.3   Chloride 98 - 111 mmol/L 104  109  110   CO2 22 - 32 mmol/L 28  31  30    Calcium  8.9 - 10.3 mg/dL 9.1  8.0  7.9      TRH service will sign off at this time as clear instructions for wound care management have been given to the nursing team and patient remains medically stable at this time.  Please reconsult if further assistance needed  Author: Drue ONEIDA Potter, MD 07/21/2024 5:58 PM  For on call review [email or web address].

## 2024-07-21 NOTE — Group Note (Signed)
 Recreation Therapy Group Note   Group Topic:Health and Wellness  Group Date: 07/21/2024 Start Time: 1445 End Time: 1530 Facilitators: Celestia Jeoffrey BRAVO, LRT, CTRS Location: Courtyard  Group Description: Outdoor Recreation. Patients had the option to play corn hole, ring toss, UNO, or listening to music while outside in the courtyard getting fresh air and sunlight. Patients helped water  and prune the raised garden beds. LRT and patients discussed things that they enjoy doing in their free time outside of the hospital. LRT encouraged patients to drink water  after being active and getting their heart rate up.   Goal Area(s) Addressed: Patient will identify leisure interests.  Patient will practice healthy decision making. Patient will engage in recreation activity.   Affect/Mood: N/A   Participation Level: Did not attend    Clinical Observations/Individualized Feedback: Patient did not attend group.    Plan: Continue to engage patient in RT group sessions 2-3x/week.   Jeoffrey BRAVO Celestia, LRT, CTRS 07/21/2024 4:39 PM

## 2024-07-21 NOTE — BHH Counselor (Signed)
 CSW reached out to pt's ACT Team Bonne Seal's), to get updated med list and last date of pt's LAI per providers request.   CSW awaits response at this time from ACT team lead, Romero Ruth.   Lum Croft, MSW, [place] 07/21/2024 9:39 AM

## 2024-07-21 NOTE — BH Assessment (Signed)
 Recreation Therapy Notes  INPATIENT RECREATION THERAPY ASSESSMENT  Patient Details Name: Miguel [name] MRN: [id number] DOB: 11/25/1950 Today's Date: 07/21/2024  Able to Participate in Assessment/Interview: No   [name] 07/21/2024, 4:24 PM

## 2024-07-21 NOTE — Plan of Care (Signed)
   Problem: Education: Goal: Knowledge of [facility] Education information/materials will improve Outcome: Progressing Goal: Emotional status will improve Outcome: Progressing Goal: Mental status will improve Outcome: Progressing Goal: Verbalization of understanding the information provided will improve Outcome: Progressing

## 2024-07-21 NOTE — Group Note (Signed)
 Date:  07/21/2024 Time:  9:21 PM  Group Topic/Focus:  Wrap-Up Group:   The focus of this group is to help patients review their daily goal of treatment and discuss progress on daily workbooks.    Participation Level:  Active  Participation Quality:  Appropriate  Affect:  Appropriate  Cognitive:  Alert  Insight: Appropriate  Engagement in Group:  Engaged  Modes of Intervention:  Discussion  Additional Comments:    [name] 07/21/2024, 9:21 PM

## 2024-07-21 NOTE — B[facility] Counselor (Signed)
 Adult Comprehensive Assessment  Patient ID: Miguel [name], male   DOB: June 29, 1951, 73 y.o.   MRN: [id number]  Information Source: Information source: Patient  Current Stressors:  Patient states their primary concerns and needs for treatment are:: me and a couple more guys, I started waking up coughing, so I think it's time for me Patient states their goals for this hospitilization and ongoing recovery are:: It ain't no goal, I just don't understand it Educational / Learning stressors: None reported Employment / Job issues: Pt reports he gets a disability check Family Relationships: None reported Financial / Lack of resources (include bankruptcy): Pt reports that his check does not cover all of his expenses Housing / Lack of housing: None reported Physical health (include injuries & life threatening diseases): Pt reports for the last 4 months his health has been declining. Pt reports he does not beleive he had an heart attack Social relationships: I don't have no issue with my friends Substance abuse: None reported Bereavement / Loss: I had two family member dead, but they won't tell me what's going on  Living/Environment/Situation:  Living Arrangements: Other relatives Living conditions (as described by patient or guardian): Pt does not report Who else lives in the home?: Pt reports he lives with his brother How long has patient lived in current situation?: a couple weeks What is atmosphere in current home: Comfortable  Family History:  Marital status: Divorced Divorced, when?: 1995 or 1996 What types of issues is patient dealing with in the relationship?: Pt does not report Additional relationship information: Pt reports he was married in 1994 Are you sexually active?: No What is your sexual orientation?: N/A Has your sexual activity been affected by drugs, alcohol, medication, or emotional stress?: N/A Does patient have children?: No  Childhood History:  By whom  was/is the patient raised?: Mother Additional childhood history information: None reported Description of patient's relationship with caregiver when they were a child: I thought it was allright Patient's description of current relationship with people who raised him/her: UTA How were you disciplined when you got in trouble as a child/adolescent?: UTA Does patient have siblings?: Yes Number of Siblings:  (UTA) Description of patient's current relationship with siblings: I got a couple brothers and a few sisters. My brothers and sisters are very fair Did patient suffer any verbal/emotional/physical/sexual abuse as a child?:  (UTA) Did patient suffer from severe childhood neglect?: No (UTA) Has patient ever been sexually abused/assaulted/raped as an adolescent or adult?:  (UTA) Was the patient ever a victim of a crime or a disaster?:  (UTA) Witnessed domestic violence?:  (UTA) Has patient been affected by domestic violence as an adult?:  INDUSTRIAL/PRODUCT DESIGNER)  Education:  Highest grade of school patient has completed: 11th grade Currently a student?: No Learning disability?: No  Employment/Work Situation:   Employment Situation: On disability Why is Patient on Disability: UTA How Long has Patient Been on Disability: UTA Patient's Job has Been Impacted by Current Illness:  (UTA) What is the Longest Time Patient has Held a Job?: UTA Where was the Patient Employed at that Time?: UTA Has Patient ever Been in the [employer]?: No  Financial Resources:   Surveyor, Quantity resources: Insurance Claims Handler, Medicare Does patient have a lawyer or guardian?:  (UTA)  Alcohol/Substance Abuse:   What has been your use of drugs/alcohol within the last 12 months?: UTA If attempted suicide, did drugs/alcohol play a role in this?:  (UTA) Alcohol/Substance Abuse Treatment Hx: Denies past history If yes, describe treatment:  UTA Has alcohol/substance abuse ever caused legal problems?:  (UTA)  Social Support System:    Patient's Community Support System: Fair Museum/gallery Exhibitions Officer System: Pt reports his brother that he lives with Type of faith/religion: Baptist How does patient's faith help to cope with current illness?: UTA  Leisure/Recreation:   Do You Have Hobbies?:  (UTA)  Strengths/Needs:   What is the patient's perception of their strengths?: UTA Patient states they can use these personal strengths during their treatment to contribute to their recovery: UTA Patient states these barriers may affect/interfere with their treatment: UTA Patient states these barriers may affect their return to the community: UTA Other important information patient would like considered in planning for their treatment: UTA  Discharge Plan:   Currently receiving community mental health services:  (UTA) Patient states concerns and preferences for aftercare planning are: UTA Patient states they will know when they are safe and ready for discharge when: UTA Does patient have access to transportation?:  (UTA) Does patient have financial barriers related to discharge medications?:  (UTA) Patient description of barriers related to discharge medications: UTA Will patient be returning to same living situation after discharge?:  (UTA)  Summary/Recommendations:   Summary and Recommendations (to be completed by the evaluator): Patient is a 73 year old male [place] [place] [facility] According to H&P, 73 year old male with a past psychiatric history significant for schizophrenia who presents under involuntary commitment initiated by family due to medication non-adherence, increasing paranoia, and behavioral disorganization. 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    Since discharge home, family reports a 1-4 week period  of progressive decline including refusal of all prescribed medications, worsening confusion, disorganized speech described as "word salad," and escalating paranoid delusions. Brother reports multiple episodes of patient whispering through the night, stating people were talking about him and planning to harm him. Patient was advised by his ACT Team to come to the ED; he refused, prompting the family to pursue IVC. Upon evaluation today, pt is a poor historian. Pt unable to answer most questions from the assessment linearlly or logically. Pt fixated on the number of medications that he is taking, reporting when he was in prison he was only taking one medication and now he is taking nine to ten medications. Pt reports he was incarcerated for 28 years and reports that he is currently living with his brother and has been at his home for 1 to 2 weeks. Pt reports he has been told multiple times that he had a heart attack, but pt reports he does not believe this to be the case. 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 Recommendations include: crisis stabilization, therapeutic milieu, encourage group attendance and participation, medication management for mood stabilization and development of comprehensive mental wellness/sobriety plan.  [name]. 07/21/2024

## 2024-07-21 NOTE — Progress Notes (Signed)
 [facility] MD Progress Note  07/21/2024 12:06 PM Miguel [name]  MRN:  [id number]  Ranger Petrich. Juhasz is a 73 year old male with a past psychiatric history significant for schizophrenia who presents under involuntary commitment initiated by family due to medication non-adherence, increasing paranoia, and behavioral disorganization. 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 Brother reports multiple episodes of patient whispering through the night, stating people were talking about him and planning to harm him. Patient was advised by his ACT Team to come to the ED; he refused, prompting the family to pursue IVC.Patient is admitted to [facility] unit with Q15 min safety monitoring. Multidisciplinary team approach is offered. Medication management; group/milieu therapy is offered.  Subjective:  Chart reviewed, case discussed in multidisciplinary meeting, patient seen during rounds.   On interview patient is noted to be resting in his bed.  He remains confused and disoriented.  He is unable to acknowledge his recent medical history of having stroke and needing rehab.  He continues to question the need for medications.  Provider educated extensively about the need to take his blood pressure medications and anticoagulants to prevent another stroke or heart attack.  Patient denies SI/HI/plan and denies hallucinations.  Past Psychiatric History: see h&P Family History:  Family History  Family history unknown: Yes   Social History:  Social History   Substance and Sexual Activity  Alcohol Use No      Social History   Substance and Sexual Activity  Drug Use No    Social History   Socioeconomic History   Marital status: Divorced    Spouse name: Not on file   Number of children: Not on file   Years of education: Not on file   Highest education level: Not on file  Occupational History   Not on file  Tobacco Use   Smoking status: Every Day    Current packs/day: 0.50    Average packs/day: 0.5 packs/day for 45.9 years (22.9 ttl pk-yrs)    Types: Cigarettes    Start date: 77   Smokeless tobacco: Never  Vaping Use   Vaping status: Never Used  Substance and Sexual Activity   Alcohol use: No   Drug use: No   Sexual activity: Not on file  Other Topics Concern   Not on file  Social History Narrative   Not on file   Social Drivers of Health   Financial Resource Strain: Not on file  Food Insecurity: Patient Unable To Answer (07/18/2024)   Hunger Vital Sign    Worried About Running Out of Food in the Last Year: Patient unable to answer    Ran Out of Food in the Last Year: Patient unable to answer  Transportation Needs: Unmet Transportation Needs (07/18/2024)   PRAPARE - Administrator, Civil Service (Medical): Yes    Lack of Transportation (Non-Medical): Yes  Physical Activity: Not on file  Stress: Not on file  Social Connections: Patient Unable To Answer (07/18/2024)   Social Connection and Isolation Panel    Frequency of Communication with Friends and Family: Patient unable to answer    Frequency of Social Gatherings with Friends and Family: Patient unable to answer    Attends Religious  Services: Patient unable to answer    Active Member of Clubs or Organizations: Patient unable to answer    Attends Club or Organization Meetings: Patient unable to answer    Marital Status: Patient unable to answer   Past Medical History:  Past Medical History:  Diagnosis Date   Biventricular failure (HCC)    CVA (cerebral vascular accident) (HCC)    Polysubstance  use disorder    Schizophrenia (HCC)     Past Surgical History:  Procedure Laterality Date   APPENDECTOMY     TRACHEOSTOMY TUBE PLACEMENT N/A 03/04/2024   Procedure: CREATION, TRACHEOSTOMY;  Surgeon: [name] Massie RAMAN, MD;  Location: [facility] ORS;  Service: ENT;  Laterality: N/A;    Current Medications: Current Facility-Administered Medications  Medication Dose Route Frequency Provider Last Rate Last Admin   acetaminophen  (TYLENOL ) tablet 650 mg  650 mg Oral Q6H PRN Hampton, Tracie B, NP       alum & mag hydroxide-simeth (MAALOX/MYLANTA) 200-200-20 MG/5ML suspension 30 mL  30 mL Oral Q4H PRN Hampton, Tracie B, NP       amiodarone  (PACERONE ) tablet 400 mg  400 mg Oral Daily [name], [name], MD   400 mg at 07/21/24 1000   ARIPiprazole  (ABILIFY ) tablet 10 mg  10 mg Oral Daily Desteny Freeman, MD   10 mg at 07/21/24 1000   ascorbic acid  (VITAMIN C ) tablet 500 mg  500 mg Oral BID [name], [name], MD   500 mg at 07/21/24 1001   feeding supplement (ENSURE PLUS HIGH PROTEIN) liquid 237 mL  237 mL Oral TID BM [name], [name], MD   237 mL at 07/21/24 1002   hydrOXYzine  (ATARAX ) tablet 25 mg  25 mg Oral TID PRN Hampton, Tracie B, NP       magnesium  hydroxide (MILK OF MAGNESIA) suspension 30 mL  30 mL Oral Daily PRN Hampton, Tracie B, NP       metoprolol  tartrate (LOPRESSOR ) tablet 25 mg  25 mg Oral BID Dorinda Homans T, MD   25 mg at 07/21/24 1000   multivitamin with minerals tablet 1 tablet  1 tablet Oral Daily [name], [name], MD   1 tablet at 07/21/24 1000   OLANZapine  (ZYPREXA ) injection 5 mg  5 mg Intramuscular TID PRN Hampton, Tracie B, NP       OLANZapine  zydis (ZYPREXA ) disintegrating tablet 5 mg  5 mg Oral TID PRN Hampton, Tracie B, NP       oxyCODONE  (Oxy IR/ROXICODONE ) immediate release tablet 5 mg  5 mg Oral Q4H PRN Dorinda Homans DASEN, MD   5 mg at 07/21/24 9366   pantoprazole  (PROTONIX ) EC tablet 40 mg  40 mg Oral Daily [name], [name], MD   40 mg at 07/21/24 1000   traZODone  (DESYREL ) tablet 50 mg   50 mg Oral QHS PRN Hampton, Tracie B, NP   50 mg at 07/20/24 2132   zinc  sulfate (50mg  elemental zinc ) capsule 220 mg  220 mg Oral Daily [name], [name], MD   220 mg at 07/21/24 1001    Lab Results:  No results found for this or any previous visit (from the past 48 hours).   Blood Alcohol level:  Lab Results  Component Value Date   [facility] <15 07/17/2024   ETH <15 02/20/2024    Metabolic Disorder Labs: Lab Results  Component Value Date   HGBA1C 5.8 (H) 07/14/2018   MPG 119.76 07/14/2018   MPG 131.24 07/13/2018   No results found for: PROLACTIN Lab  Results  Component Value Date   CHOL 170 07/13/2018   TRIG 34 03/08/2024   HDL 41 07/13/2018   CHOLHDL 4.1 07/13/2018   VLDL 10 07/13/2018   LDLCALC 119 (H) 07/13/2018    Physical Findings: AIMS:  , ,  ,  ,    CIWA:    COWS:      Psychiatric Specialty Exam:  Presentation  General Appearance: Appropriate for Environment  Eye Contact:Fleeting  Speech:Normal Rate  Speech Volume:Decreased    Mood and Affect  Mood:Anxious  Affect:Flat   Thought Process  Thought Processes:Disorganized  Orientation:Partial  Thought Content:Illogical; Paranoid Ideation  Hallucinations:Hallucinations: None  Ideas of Reference:Delusions  Suicidal Thoughts:Suicidal Thoughts: No  Homicidal Thoughts:Homicidal Thoughts: No   Sensorium  Memory:Immediate Poor; Recent Poor; Remote Poor  Judgment:Impaired  Insight:None   Executive Functions  Concentration:Poor  Attention Span:Poor  Recall:Poor  Fund of Knowledge:Poor  Language:Fair   Psychomotor Activity  Psychomotor Activity:Psychomotor Activity: Normal  Musculoskeletal: Strength & Muscle Tone: within normal limits Gait & Station: normal Assets  Assets:Communication Skills; Desire for Improvement; Social Support    Physical Exam: Physical Exam Vitals and nursing note reviewed.    ROS Blood pressure (!) 155/71, pulse 65, temperature (!) 97.2 F  (36.2 C), resp. rate 19, height 6' 1 (1.854 m), weight 81.2 kg, SpO2 95%. Body mass index is 23.62 kg/m.  Diagnosis: Active Problems:   Schizophrenia, paranoid (HCC)   Dementia with behavioral disturbance (HCC)   PLAN: Safety and Monitoring:  -- Involuntary admission to inpatient psychiatric unit for safety, stabilization and treatment  -- Daily contact with patient to assess and evaluate symptoms and progress in treatment  -- Patient's case to be discussed in multi-disciplinary team meeting  -- Observation Level : q15 minute checks  -- Vital signs:  q12 hours  -- Precautions: suicide, elopement, and assault -- Encouraged patient to participate in unit milieu and in scheduled group therapies  2. Psychiatric Treatment:  Scheduled Medications:  Abilify  10 mg daily is started as he was on it before= will reach out to ACT team to get list of his psychotropic medications   -- The risks/benefits/side-effects/alternatives to this medication were discussed in detail with the patient and time was given for questions. The patient consents to medication trial.  3. Medical Issues Being Addressed:  [facility] consulted for wound care management   Assessment and Plan:   Sacral Decubitus Ulcer Full thickness decubitus ulcer in setting of recent extended hospitalization, discharged from [facility] Nov 14th Wound looks pretty good overall WOC on board we appreciate input   Cognitive Deficit Concern for anoxic brain injury at discharge 02/2024 Delirium precautions   Peripheral Vascular Disease Legs with features c/w peripheral vascular disease, diminished DP pulses Would benefit from outpatient ABI's   HFrEF Euvolemic at this time Continue home medications   Hx Atrial Flutter Not on any anticoagulation at this time EKG with NSR Continue amiodarone   Hypertension Noted, adjust meds gradually after med rec complete     4. Discharge Planning:   -- Social work and case management to assist with  discharge planning and identification of hospital follow-up needs prior to discharge  -- Estimated LOS: 3-4 days  Allyn Foil, MD 07/21/2024, 12:06 PM

## 2024-07-21 NOTE — Progress Notes (Signed)
   07/21/24 2300  Psych Admission Type (Psych Patients Only)  Admission Status Involuntary  Psychosocial Assessment  Patient Complaints Irritability  Eye Contact Brief  Facial Expression Animated  Affect Labile  Speech Tangential  Interaction Assertive  Motor Activity Slow  Appearance/Hygiene In hospital gown  Behavior Characteristics Irritable  Mood Irritable  Thought Process  Coherency Disorganized  Content Blaming others  Delusions None reported or observed  Perception WDL  Hallucination None reported or observed  Judgment Impaired  Confusion Mild  Danger to Self  Current suicidal ideation? Denies  Agreement Not to Harm Self Yes  Description of Agreement Verbal  Danger to Others  Danger to Others None reported or observed

## 2024-07-21 NOTE — Group Note (Signed)
 Date:  07/21/2024 Time:  11:22 AM  Group Topic/Focus:   Thanksgiving Gratitude  Gratitude helps us  slow down and see the good that's already here. Science shows that practicing gratitude can boost happiness, lower stress, and even strengthen relationships. But gratitude is more than positive thinking it's a way of shifting our perspective, one day at a time. Choosing gratitude every day is monumental in shaping the quality of your daily life for the better. But, gratitude grows best in community. Gratitude is powerful. It changes our perspective, softens our hearts, and helps us  see the good even when life feels heavy. Studies show that gratitude has measurable benefits, it boosts happiness, lowers stress, and strengthens relationships.   Participation Level:  Active  Participation Quality:  Appropriate  Affect:  Appropriate  Cognitive:  Appropriate  Insight: Appropriate  Engagement in Group:  Engaged  Modes of Intervention:  Activity and Discussion  Additional Comments:  N/A  [name] 07/21/2024, 11:22 AM

## 2024-07-21 NOTE — Progress Notes (Signed)
   07/21/24 1400  Psych Admission Type (Psych Patients Only)  Admission Status Involuntary  Psychosocial Assessment  Patient Complaints Disorientation;Irritability;Worrying  Eye Contact Brief  Facial Expression Animated  Affect Labile  Speech Tangential  Interaction Assertive  Motor Activity Slow  Appearance/Hygiene In scrubs  Behavior Characteristics Unwilling to participate;Irritable  Mood Irritable  Thought Process  Coherency Disorganized  Content Blaming others  Delusions None reported or observed  Perception WDL  Hallucination None reported or observed  Judgment Impaired  Confusion Mild  Danger to Self  Current suicidal ideation? Denies  Agreement Not to Harm Self Yes  Description of Agreement verbal  Danger to Others  Danger to Others None reported or observed

## 2024-07-22 MED ORDER — ARIPIPRAZOLE 5 MG PO TABS
30.0000 mg | ORAL_TABLET | Freq: Every day | ORAL | Status: DC
  Administered 2024-07-23 – 2024-07-29 (×7): 30 mg via ORAL
  Filled 2024-07-22 (×7): qty 6

## 2024-07-22 NOTE — Group Note (Signed)
 Date:  07/22/2024 Time:  11:05 AM  Group Topic/Focus:    Gratitude Gratitude is the act of recognizing and acknowledging the good things that happen, resulting in a state of appreciation [employer], 2010). Often when we consider what we are grateful for, overt and profound life experiences, circumstances, and events come to mind. Gratitude, thankfulness, or gratefulness is a feeling of appreciation by a recipient of another's kindness. This kindness can be gifts, help, favors, or another form of generosity to another person. Gratitude seems to reduce depression symptoms people with a grateful mindset report higher satisfaction with life, strong social relationships and more self-esteem than those who don't practice gratitude. But it's also possible that depressed people are less likely to practice gratitude.  Participation Level:  Active  Participation Quality:  Appropriate  Affect:  Appropriate  Cognitive:  Appropriate  Insight: Appropriate  Engagement in Group:  Engaged  Modes of Intervention:  Activity and Exploration  Additional Comments:  N/A  [name] 07/22/2024, 11:05 AM

## 2024-07-22 NOTE — Progress Notes (Signed)
 [facility] MD Progress Note  07/22/2024 12:19 PM EDWORD CU  MRN:  [id number]  Miguel [name] is a 73 year old male with a past psychiatric history significant for schizophrenia who presents under involuntary commitment initiated by family due to medication non-adherence, increasing paranoia, and behavioral disorganization. 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 Brother reports multiple episodes of patient whispering through the night, stating people were talking about him and planning to harm him. Patient was advised by his ACT Team to come to the ED; he refused, prompting the family to pursue IVC.Patient is admitted to [facility] unit with Q15 min safety monitoring. Multidisciplinary team approach is offered. Medication management; group/milieu therapy is offered.  Subjective:  Chart reviewed, case discussed in multidisciplinary meeting, patient seen during rounds.   Patient is noted to be sitting in the day area.  He came into his room to talk to the provider he continues to remain fixated on how to cops came to his house and arrested him.  He has slurred speech after the stroke.  He talks about living with his brother and his wife.  He reports cooking for himself.  He lacks insight into his recent health issues and the need to take medications.  He reports that he was not taking any medicine now he is given 9 different medications.  Patient was encouraged to take his medications to prevent further MI or stroke.  He is not endorsing SI/HI/plan.  He denies auditory/visual  hallucinations  Past Psychiatric History: see h&P Family History:  Family History  Family history unknown: Yes   Social History:  Social History   Substance and Sexual Activity  Alcohol Use No     Social History   Substance and Sexual Activity  Drug Use No    Social History   Socioeconomic History   Marital status: Divorced    Spouse name: Not on file   Number of children: Not on file   Years of education: Not on file   Highest education level: Not on file  Occupational History   Not on file  Tobacco Use   Smoking status: Every Day    Current packs/day: 0.50    Average packs/day: 0.5 packs/day for 45.9 years (23.0 ttl pk-yrs)    Types: Cigarettes    Start date: 109   Smokeless tobacco: Never  Vaping Use   Vaping status: Never Used  Substance and Sexual Activity   Alcohol use: No   Drug use: No   Sexual activity: Not on file  Other Topics Concern   Not on file  Social History Narrative   Not on file   Social Drivers of Health   Financial Resource Strain: Not on file  Food Insecurity: Patient Unable To Answer (07/18/2024)   Hunger Vital Sign    Worried About Running Out of Food in the Last Year: Patient unable to answer    Ran Out of Food in the Last Year: Patient unable to answer  Transportation Needs: Unmet Transportation Needs (07/18/2024)   PRAPARE - Administrator, Civil Service (Medical): Yes    Lack of Transportation (Non-Medical): Yes  Physical Activity: Not on file  Stress: Not on file  Social Connections: Patient Unable To Answer (07/18/2024)   Social Connection and Isolation Panel    Frequency of Communication with Friends and Family: Patient unable to answer    Frequency of Social Gatherings with Friends and Family: Patient unable to answer    Attends Religious Services: Patient unable to answer    Active Member of Clubs or Organizations: Patient unable to answer    Attends Banker Meetings: Patient unable to answer     Marital Status: Patient unable to answer   Past Medical History:  Past Medical History:  Diagnosis Date   Biventricular failure (HCC)    CVA (cerebral vascular accident) (HCC)    Polysubstance use disorder    Schizophrenia (HCC)     Past Surgical History:  Procedure Laterality Date   APPENDECTOMY     TRACHEOSTOMY TUBE PLACEMENT N/A 03/04/2024   Procedure: CREATION, TRACHEOSTOMY;  Surgeon: [name] Massie RAMAN, MD;  Location: [facility] ORS;  Service: ENT;  Laterality: N/A;    Current Medications: Current Facility-Administered Medications  Medication Dose Route Frequency Provider Last Rate Last Admin   acetaminophen  (TYLENOL ) tablet 650 mg  650 mg Oral Q6H PRN Hampton, Tracie B, NP       alum & mag hydroxide-simeth (MAALOX/MYLANTA) 200-200-20 MG/5ML suspension 30 mL  30 mL Oral Q4H PRN Hampton, Tracie B, NP       amiodarone  (PACERONE ) tablet 400 mg  400 mg Oral Daily [name], [name], MD   400 mg at 07/22/24 9076   ARIPiprazole  (ABILIFY ) tablet 10 mg  10 mg Oral Daily Iylah Dworkin, MD   10 mg at 07/22/24 9076   ascorbic acid  (VITAMIN C ) tablet 500 mg  500 mg Oral BID [name], [name], MD   500 mg at 07/22/24 0924   feeding supplement (ENSURE PLUS HIGH PROTEIN) liquid 237 mL  237 mL Oral TID BM [name], [name], MD   237 mL at 07/22/24 0929   hydrOXYzine  (ATARAX ) tablet 25 mg  25 mg Oral TID PRN Hampton, Tracie B, NP       magnesium  hydroxide (MILK OF MAGNESIA) suspension 30 mL  30 mL Oral Daily PRN Hampton, Tracie B, NP       metoprolol  tartrate (LOPRESSOR ) tablet 25 mg  25 mg Oral BID Dorinda Homans T, MD   25 mg at 07/22/24 9075   multivitamin with minerals tablet 1 tablet  1 tablet Oral Daily [name], [name], MD   1 tablet at 07/22/24 9075   OLANZapine  (ZYPREXA ) injection 5 mg  5 mg Intramuscular TID PRN Hampton, Tracie B, NP       OLANZapine  zydis (ZYPREXA ) disintegrating tablet 5 mg  5 mg Oral TID PRN Hampton, Tracie B, NP       oxyCODONE  (Oxy IR/ROXICODONE ) immediate release tablet 5 mg   5 mg Oral Q4H PRN Dorinda Homans DASEN, MD   5 mg at 07/21/24 9366   pantoprazole  (PROTONIX ) EC tablet 40 mg  40 mg Oral Daily [name], [name], MD   40 mg at 07/22/24 9075   traZODone  (DESYREL ) tablet 50 mg  50 mg Oral QHS PRN Hampton, Tracie B, NP   50 mg at 07/21/24 2124   zinc  sulfate (50mg  elemental zinc ) capsule 220 mg  220 mg Oral Daily [name], [name], MD   220 mg at 07/22/24 9076    Lab Results:  No results found for this or any previous visit (from the past 48 hours).   Blood Alcohol level:  Lab Results  Component  Value Date   [facility] <15 07/17/2024   ETH <15 02/20/2024    Metabolic Disorder Labs: Lab Results  Component Value Date   HGBA1C 5.8 (H) 07/14/2018   MPG 119.76 07/14/2018   MPG 131.24 07/13/2018   No results found for: PROLACTIN Lab Results  Component Value Date   CHOL 170 07/13/2018   TRIG 34 03/08/2024   HDL 41 07/13/2018   CHOLHDL 4.1 07/13/2018   VLDL 10 07/13/2018   LDLCALC 119 (H) 07/13/2018    Physical Findings: AIMS:  , ,  ,  ,    CIWA:    COWS:      Psychiatric Specialty Exam:  Presentation  General Appearance: Appropriate for Environment  Eye Contact:Fleeting  Speech:Normal Rate  Speech Volume:Decreased    Mood and Affect  Mood:Anxious  Affect:Flat   Thought Process  Thought Processes:Disorganized  Orientation:Partial  Thought Content:Illogical; Paranoid Ideation  Hallucinations:denies  Ideas of Reference:Delusions  Suicidal Thoughts:denies  Homicidal Thoughts:denies   Sensorium  Memory:Immediate Poor; Recent Poor; Remote Poor  Judgment:Impaired  Insight:None   Executive Functions  Concentration:Poor  Attention Span:Poor  Recall:Poor  Fund of Knowledge:Poor  Language:Fair   Psychomotor Activity  Psychomotor Activity:No data recorded  Musculoskeletal: Strength & Muscle Tone: within normal limits Gait & Station: normal Assets  Assets:Communication Skills; Desire for Improvement; Social  Support    Physical Exam: Physical Exam Vitals and nursing note reviewed.    ROS Blood pressure (!) 120/59, pulse 74, temperature (!) 97.2 F (36.2 C), resp. rate 19, height 6' 1 (1.854 m), weight 81.2 kg, SpO2 95%. Body mass index is 23.62 kg/m.  Diagnosis: Active Problems:   Schizophrenia, paranoid (HCC)   Dementia with behavioral disturbance (HCC)   PLAN: Safety and Monitoring:  -- Involuntary admission to inpatient psychiatric unit for safety, stabilization and treatment  -- Daily contact with patient to assess and evaluate symptoms and progress in treatment  -- Patient's case to be discussed in multi-disciplinary team meeting  -- Observation Level : q15 minute checks  -- Vital signs:  q12 hours  -- Precautions: suicide, elopement, and assault -- Encouraged patient to participate in unit milieu and in scheduled group therapies  2. Psychiatric Treatment:  Scheduled Medications:  Abilify  10 mg daily is started as he was on it before= will reach out to ACT team to get list of his psychotropic medications   -- The risks/benefits/side-effects/alternatives to this medication were discussed in detail with the patient and time was given for questions. The patient consents to medication trial.  3. Medical Issues Being Addressed:  [facility] consulted for wound care management   Assessment and Plan:   Sacral Decubitus Ulcer Full thickness decubitus ulcer in setting of recent extended hospitalization, discharged from [facility] 14[facility] Wound looks pretty good overall WOC on board we appreciate input   Cognitive Deficit Concern for anoxic brain injury at discharge 02/2024 Delirium precautions   Peripheral Vascular Disease Legs with features c/w peripheral vascular disease, diminished DP pulses Would benefit from outpatient ABI's   HFrEF Euvolemic at this time Continue home medications   Hx Atrial Flutter Not on any anticoagulation at this time EKG with NSR Continue  amiodarone   Hypertension Noted, adjust meds gradually after med rec complete     4. Discharge Planning:   -- Social work and case management to assist with discharge planning and identification of hospital follow-up needs prior to discharge  -- Estimated LOS: 3-4 days  Allyn Foil, MD 07/22/2024, 12:19 PM

## 2024-07-22 NOTE — Progress Notes (Signed)
   07/22/24 1442  Psych Admission Type (Psych Patients Only)  Admission Status Involuntary  Psychosocial Assessment  Patient Complaints Irritability  Eye Contact Brief  Facial Expression Animated  Affect Labile  Speech Tangential  Interaction Assertive  Motor Activity Slow  Appearance/Hygiene In hospital gown  Behavior Characteristics Irritable  Mood Irritable  Thought Process  Coherency Disorganized  Content Blaming others  Delusions None reported or observed  Perception WDL  Hallucination None reported or observed  Judgment Impaired  Confusion Mild  Danger to Self  Current suicidal ideation? Denies  Agreement Not to Harm Self Yes  Description of Agreement verbal  Danger to Others  Danger to Others None reported or observed

## 2024-07-22 NOTE — Plan of Care (Signed)
  Problem: Coping: Goal: Ability to verbalize frustrations and anger appropriately will improve Outcome: Progressing   Problem: Education: Goal: Emotional status will improve Outcome: Progressing   Problem: Education: Goal: Mental status will improve Outcome: Not Progressing

## 2024-07-22 NOTE — Progress Notes (Signed)
 Wound care performed as per order. Patient appeared to tolerate well.

## 2024-07-22 NOTE — Plan of Care (Signed)
  Problem: Activity: Goal: Sleeping patterns will improve Outcome: Progressing   

## 2024-07-22 NOTE — Group Note (Signed)
 Date:  07/22/2024 Time:  8:43 PM  Group Topic/Focus:  Wrap-Up Group:   The focus of this group is to help patients review their daily goal of treatment and discuss progress on daily workbooks.    Participation Level:  Did Not Attend  Participation Quality:     Affect:     Cognitive:     Insight: None  Engagement in Group:     Modes of Intervention:     Additional Comments:    [name] 07/22/2024, 8:43 PM

## 2024-07-22 NOTE — Progress Notes (Signed)
   07/22/24 0543  15 Minute Checks  Location Dayroom  Visual Appearance Calm  Behavior Composed  Sleep (Behavioral Health Patients Only)  Calculate sleep? (Click Yes once per 24 hr at 0600 safety check) Yes  Documented sleep last 24 hours 8

## 2024-07-23 NOTE — Group Note (Signed)
 Recreation Therapy Group Note   Group Topic:Communication  Group Date: 07/23/2024 Start Time: 1210 End Time: 1240 Facilitators: Celestia Jeoffrey FORBES ARTICE, CTRS Location: Craft Room  Group Description: Gratitude Conversations. Patients and LRT discussed what gratitude means, how we can express it and what it means to us , personally. LRT gave an educational handout on the definition of gratitude as well as the benefits that come from it. LRT and patients discussed different gratitude prompts and allowed patients to join in the guided discussion. LRT and pts processed how showing gratitude towards themselves, and others can be applied to everyday life post-discharge. LRT offered journals and folders to pts afterwards.   Goal Area(s) Addressed:  Patient will identify the definition of gratitude. Patient will learn different gratitude exercises. Patient will increase communication.  Patient will identify a new coping skill.   Affect/Mood: Appropriate and Flat   Participation Level: Engaged   Participation Quality: Independent   Behavior: Calm and Cooperative   Speech/Thought Process: Coherent   Insight: Limited   Judgement: Fair    Modes of Intervention: Clarification, Education, Exploration, and Guided Discussion   Patient Response to Interventions:  Receptive   Education Outcome:  In group clarification offered    Clinical Observations/Individualized Feedback: Jovian was active in their participation of session activities and group discussion. Pt identified courage as something he is thankful for himself for.    Plan: Continue to engage patient in RT group sessions 2-3x/week.   Jeoffrey FORBES Celestia, LRT, CTRS 07/23/2024 1:06 PM

## 2024-07-23 NOTE — Group Note (Signed)
 Date:  07/23/2024 Time:  8:46 PM  Group Topic/Focus:  Making Healthy Choices:   The focus of this group is to help patients identify negative/unhealthy choices they were using prior to admission and identify positive/healthier coping strategies to replace them upon discharge.    Participation Level:  Active  Participation Quality:  Appropriate  Affect:  Appropriate  Cognitive:  Appropriate  Insight: Good  Engagement in Group:  Engaged  Modes of Intervention:  Discussion  Additional Comments:    Miguel [name] 07/23/2024, 8:46 PM

## 2024-07-23 NOTE — Group Note (Signed)
 LCSW Group Therapy Note   Group Date: 07/23/2024 Start Time: 1300 End Time: 1330   Type of Therapy and Topic:  Group Therapy: Challenging Core Beliefs  Participation Level:  Did Not Attend  Description of Group:  Patients were educated about core beliefs and asked to identify one harmful core belief that they have. Patients were asked to explore from where those beliefs originate. Patients were asked to discuss how those beliefs make them feel and the resulting behaviors of those beliefs. They were then be asked if those beliefs are true and, if so, what evidence they have to support them. Lastly, group members were challenged to replace those negative core beliefs with helpful beliefs.   Therapeutic Goals:   1. Patient will identify harmful core beliefs and explore the origins of such beliefs. 2. Patient will identify feelings and behaviors that result from those core beliefs. 3. Patient will discuss whether such beliefs are true. 4.  Patient will replace harmful core beliefs with helpful ones.  Summary of Patient Progress: X  Therapeutic Modalities: Cognitive Behavioral Therapy; Solution-Focused Therapy   Ceola Para D Lenette Rau, [place] 07/23/2024  1:35 PM

## 2024-07-23 NOTE — Plan of Care (Signed)
  Problem: Education: Goal: Knowledge of [place] [facility] Education information/materials will improve Outcome: Progressing   Problem: Activity: Goal: Interest or engagement in activities will improve Outcome: Progressing   Problem: Coping: Goal: Ability to verbalize frustrations and anger appropriately will improve Outcome: Progressing   Problem: Education: Goal: Knowledge of  General Education information/materials will improve Outcome: Progressing Goal: Emotional status will improve Outcome: Progressing Goal: Mental status will improve Outcome: Progressing Goal: Verbalization of understanding the information provided will improve Outcome: Progressing   Problem: Activity: Goal: Interest or engagement in activities will improve Outcome: Progressing Goal: Sleeping patterns will improve Outcome: Progressing   Problem: Coping: Goal: Ability to verbalize frustrations and anger appropriately will improve Outcome: Progressing Goal: Ability to demonstrate self-control will improve Outcome: Progressing   Problem: Health Behavior/Discharge Planning: Goal: Identification of resources available to assist in meeting health care needs will improve Outcome: Progressing Goal: Compliance with treatment plan for underlying cause of condition will improve Outcome: Progressing   Problem: Physical Regulation: Goal: Ability to maintain clinical measurements within normal limits will improve Outcome: Progressing   Problem: Safety: Goal: Periods of time without injury will increase Outcome: Progressing   Problem: Coping: Goal: Ability to adjust to condition or change in health will improve Outcome: Progressing   Problem: Health Behavior/Discharge Planning: Goal: Ability to identify and utilize available resources and services will improve Outcome: Progressing

## 2024-07-23 NOTE — Progress Notes (Addendum)
 The transfer of care for the patient was completed at the change of shift with the outgoing nurse. The patient was observed sitting in the dayroom, interacting appropriately with peers. During the 1:1 assessment, the patient denied any suicidal ideation, self-injurious behavior, homicidal ideation, auditory or visual hallucinations, and pain, although he refused to answer most of the assessment questions. The patient appeared calm and cooperative, attending and participating in the evening groups, and described his mood as great. He was compliant with all scheduled medications, and no unsafe behaviors or concerns were noted. [name] refused his wound dressing, explaining that it makes him feel uncomfortable due to the staging of feces in the dressing following bowel movements. The patient slept throughout the shift, and safety checks and hourly rounding were completed as per protocol. Upon waking, [name] reported 7/10 shoulder pain and received PRN Tylenol , which provided good relief.   07/23/24 2109  Psych Admission Type (Psych Patients Only)  Admission Status Involuntary  Psychosocial Assessment  Patient Complaints Suspiciousness  Eye Contact Brief  Facial Expression Animated  Affect Irritable  Speech Rapid  Interaction Assertive  Motor Activity Slow  Appearance/Hygiene In scrubs  Behavior Characteristics Cooperative  Mood Pleasant  Thought Process  Coherency Circumstantial  Content Preoccupation  Delusions None reported or observed  Perception WDL  Hallucination None reported or observed  Judgment Impaired  Confusion Mild  Danger to Self  Current suicidal ideation? Denies  Agreement Not to Harm Self Yes  Description of Agreement Verbal  Danger to Others  Danger to Others None reported or observed

## 2024-07-23 NOTE — Progress Notes (Signed)
 Wound care performed as per order. Patient appeared to tolerate well.

## 2024-07-23 NOTE — Progress Notes (Signed)
  Psych assessment: Argumentative. Frequent requests of staff.  Denies SI/HI and AVH.  Interaction / Group attendance:  Present in the milieu for groups and meals.   Medication/ PRNs:  Compliant with strong encouragement from Dr. [name]. Continues to deny ever having a MI.    Pain: Denies.  15 min checks in place for safety.

## 2024-07-23 NOTE — Progress Notes (Signed)
 [facility] MD Progress Note  07/23/2024 11:54 AM Miguel [name]  MRN:  [id number]  Miguel [name]. Shouse is a 73 year old male with a past psychiatric history significant for schizophrenia who presents under involuntary commitment initiated by family due to medication non-adherence, increasing paranoia, and behavioral disorganization. 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 Brother reports multiple episodes of patient whispering through the night, stating people were talking about him and planning to harm him. Patient was advised by his ACT Team to come to the ED; he refused, prompting the family to pursue IVC.Patient is admitted to [facility] unit with Q15 min safety monitoring. Multidisciplinary team approach is offered. Medication management; group/milieu therapy is offered.  Subjective:  Chart reviewed, case discussed in multidisciplinary meeting, patient seen during rounds.   Patient is noted to be taking his medications with the nurse.  He desisted and insisted that he does not need any medications.  He continues to display lack of insight into his recent history significant for cardiac arrest, respiratory failure, kidney failure or causing him to be bedridden for weeks and needing rehab for sacral decubitus ulcer and recovery.  In spite of education patient continues to say that he has no heart problems.  He did take his medication at some point after a lot of encouragement.  He denies auditory/visual hallucinations but remains paranoid about the medications.  He denies suicidal/homicidal  ideations  Provider called patient's family Past Psychiatric History: see h&P Family History:  Family History  Family history unknown: Yes   Social History:  Social History   Substance and Sexual Activity  Alcohol Use No     Social History   Substance and Sexual Activity  Drug Use No    Social History   Socioeconomic History   Marital status: Divorced    Spouse name: Not on file   Number of children: Not on file   Years of education: Not on file   Highest education level: Not on file  Occupational History   Not on file  Tobacco Use   Smoking status: Every Day    Current packs/day: 0.50    Average packs/day: 0.5 packs/day for 45.9 years (23.0 ttl pk-yrs)    Types: Cigarettes    Start date: 54   Smokeless tobacco: Never  Vaping Use   Vaping status: Never Used  Substance and Sexual Activity   Alcohol use: No   Drug use: No   Sexual activity: Not on file  Other Topics Concern   Not on file  Social History Narrative   Not on file   Social Drivers of Health   Financial Resource Strain: Not on file  Food Insecurity: Patient Unable To Answer (07/18/2024)   Hunger Vital Sign    Worried About Running Out of Food in the Last Year: Patient unable to answer    Ran Out of Food in the Last Year: Patient unable to answer  Transportation Needs: Unmet Transportation Needs (07/18/2024)   PRAPARE - Transportation    Lack of Transportation (Medical): Yes    Lack of Transportation (Non-Medical): Yes  Physical Activity: Not on file  Stress: Not on file  Social Connections: Patient Unable To Answer (07/18/2024)   Social  Connection and Isolation Panel    Frequency of Communication with Friends and Family: Patient unable to answer    Frequency of Social Gatherings with Friends and Family: Patient unable to answer    Attends Religious Services: Patient unable to answer    Active Member of Clubs or Organizations: Patient unable to answer    Attends Banker  Meetings: Patient unable to answer    Marital Status: Patient unable to answer   Past Medical History:  Past Medical History:  Diagnosis Date   Biventricular failure (HCC)    CVA (cerebral vascular accident) (HCC)    Polysubstance use disorder    Schizophrenia (HCC)     Past Surgical History:  Procedure Laterality Date   APPENDECTOMY     TRACHEOSTOMY TUBE PLACEMENT N/A 03/04/2024   Procedure: CREATION, TRACHEOSTOMY;  Surgeon: [name] Massie RAMAN, MD;  Location: [facility] ORS;  Service: ENT;  Laterality: N/A;    Current Medications: Current Facility-Administered Medications  Medication Dose Route Frequency Provider Last Rate Last Admin   acetaminophen  (TYLENOL ) tablet 650 mg  650 mg Oral Q6H PRN Hampton, Tracie B, NP       alum & mag hydroxide-simeth (MAALOX/MYLANTA) 200-200-20 MG/5ML suspension 30 mL  30 mL Oral Q4H PRN Hampton, Tracie B, NP   30 mL at 07/22/24 1639   amiodarone  (PACERONE ) tablet 400 mg  400 mg Oral Daily [name], [name], MD   400 mg at 07/23/24 0934   ARIPiprazole  (ABILIFY ) tablet 30 mg  30 mg Oral Daily Parlee Amescua, MD   30 mg at 07/23/24 0934   ascorbic acid  (VITAMIN C ) tablet 500 mg  500 mg Oral BID [name], [name], MD   500 mg at 07/23/24 0934   feeding supplement (ENSURE PLUS HIGH PROTEIN) liquid 237 mL  237 mL Oral TID BM [name], [name], MD   237 mL at 07/23/24 0954   hydrOXYzine  (ATARAX ) tablet 25 mg  25 mg Oral TID PRN Hampton, Tracie B, NP   25 mg at 07/22/24 2117   magnesium  hydroxide (MILK OF MAGNESIA) suspension 30 mL  30 mL Oral Daily PRN Hampton, Tracie B, NP       metoprolol  tartrate (LOPRESSOR ) tablet 25 mg  25 mg Oral BID Dorinda Homans T, MD   25 mg at 07/23/24 9065   multivitamin with minerals tablet 1 tablet  1 tablet Oral Daily [name], [name], MD   1 tablet at 07/23/24 0934   OLANZapine  (ZYPREXA ) injection 5 mg  5 mg Intramuscular TID PRN Hampton, Tracie B, NP       OLANZapine  zydis (ZYPREXA ) disintegrating tablet 5 mg  5 mg Oral TID PRN Hampton,  Tracie B, NP       oxyCODONE  (Oxy IR/ROXICODONE ) immediate release tablet 5 mg  5 mg Oral Q4H PRN Dorinda Homans DASEN, MD   5 mg at 07/21/24 9366   pantoprazole  (PROTONIX ) EC tablet 40 mg  40 mg Oral Daily [name], [name], MD   40 mg at 07/23/24 0934   traZODone  (DESYREL ) tablet 50 mg  50 mg Oral QHS PRN Hampton, Tracie B, NP   50 mg at 07/22/24 2117   zinc  sulfate (50mg  elemental zinc ) capsule 220 mg  220 mg Oral Daily [name], [name], MD   220 mg at 07/23/24 9065    Lab Results:  No results found for this or any previous visit (from the past 48 hours).   Blood Alcohol level:  Lab Results  Component Value Date  ETH <15 07/17/2024   ETH <15 02/20/2024    Metabolic Disorder Labs: Lab Results  Component Value Date   HGBA1C 5.8 (H) 07/14/2018   MPG 119.76 07/14/2018   MPG 131.24 07/13/2018   No results found for: PROLACTIN Lab Results  Component Value Date   CHOL 170 07/13/2018   TRIG 34 03/08/2024   HDL 41 07/13/2018   CHOLHDL 4.1 07/13/2018   VLDL 10 07/13/2018   LDLCALC 119 (H) 07/13/2018    Physical Findings: AIMS:  , ,  ,  ,    CIWA:    COWS:      Psychiatric Specialty Exam:  Presentation  General Appearance: Appropriate for Environment  Eye Contact:Fleeting  Speech:Normal Rate  Speech Volume:Decreased    Mood and Affect  Mood:Anxious  Affect:Flat   Thought Process  Thought Processes:Disorganized  Orientation:Partial  Thought Content:Illogical; Paranoid Ideation  Hallucinations:denies  Ideas of Reference:Delusions  Suicidal Thoughts:denies  Homicidal Thoughts:denies   Sensorium  Memory:Immediate Poor; Recent Poor; Remote Poor  Judgment:Impaired  Insight:None   Executive Functions  Concentration:Poor  Attention Span:Poor  Recall:Poor  Fund of Knowledge:Poor  Language:Fair   Psychomotor Activity  Psychomotor Activity:No data recorded  Musculoskeletal: Strength & Muscle Tone: within normal limits Gait & Station:  normal Assets  Assets:Communication Skills; Desire for Improvement; Social Support    Physical Exam: Physical Exam Vitals and nursing note reviewed.    ROS Blood pressure (!) 120/59, pulse 74, temperature (!) 97.2 F (36.2 C), resp. rate 19, height 6' 1 (1.854 m), weight 81.2 kg, SpO2 95%. Body mass index is 23.62 kg/m.  Diagnosis: Active Problems:   Schizophrenia, paranoid (HCC)   Dementia with behavioral disturbance (HCC)   PLAN: Safety and Monitoring:  -- Involuntary admission to inpatient psychiatric unit for safety, stabilization and treatment  -- Daily contact with patient to assess and evaluate symptoms and progress in treatment  -- Patient's case to be discussed in multi-disciplinary team meeting  -- Observation Level : q15 minute checks  -- Vital signs:  q12 hours  -- Precautions: suicide, elopement, and assault -- Encouraged patient to participate in unit milieu and in scheduled group therapies  2. Psychiatric Treatment:  Scheduled Medications:  Abilify  10 mg daily is started as he was on it before= will reach out to ACT team to get list of his psychotropic medications   -- The risks/benefits/side-effects/alternatives to this medication were discussed in detail with the patient and time was given for questions. The patient consents to medication trial.  3. Medical Issues Being Addressed:  [facility] consulted for wound care management   Assessment and Plan:   Sacral Decubitus Ulcer Full thickness decubitus ulcer in setting of recent extended hospitalization, discharged from [facility] Nov 14th Wound looks pretty good overall WOC on board we appreciate input   Cognitive Deficit Concern for anoxic brain injury at discharge 02/2024 Delirium precautions   Peripheral Vascular Disease Legs with features c/w peripheral vascular disease, diminished DP pulses Would benefit from outpatient ABI's   HFrEF Euvolemic at this time Continue home medications   Hx Atrial  Flutter Not on any anticoagulation at this time EKG with NSR Continue amiodarone   Hypertension Noted, adjust meds gradually after med rec complete     4. Discharge Planning:   -- Social work and case management to assist with discharge planning and identification of hospital follow-up needs prior to discharge  -- Estimated LOS: 3-4 days  Allyn Foil, MD 07/23/2024, 11:54 AM

## 2024-07-23 NOTE — Plan of Care (Signed)
  Problem: Activity: Goal: Interest or engagement in activities will improve Outcome: Progressing   Problem: Coping: Goal: Ability to verbalize frustrations and anger appropriately will improve Outcome: Progressing   Problem: Education: Goal: Verbalization of understanding the information provided will improve Outcome: Not Progressing

## 2024-07-23 NOTE — Group Note (Signed)
 Date:  07/23/2024 Time:  3:18 PM  Group Topic/Focus:  Thanksgiving Football and conversation     Participation Level:  Did Not Attend    Miguel [name] 07/23/2024, 3:18 PM

## 2024-07-23 NOTE — [facility] Group Notes (Signed)
 Spirituality Group   Description: Participant directed exploration of values, beliefs and meaning   Theme of Thanksgiving & Meaning  **Focus on Gratitude: Invite reflection on sources of gratitude (external/internal); goal to invite internal gratitude to foster 1) reconnection with life-giving activities 2) self-compassion.     Following a brief framework of chaplain's role and ground rules of group behavior, participants are invited to share concerns or questions that engage spiritual life. Emphasis placed on common themes and shared experiences and ways to make meaning and clarify living into one's values.   Theory/Process/Goal: Utilize the theoretical framework of group therapy established by Celena Kite, Relational Cultural Theory and Rogerian approaches to facilitate relational empathy and use of the "here and now" to foster reflection, self-awareness, and sharing.   Observations: Miguel [name] was very active in the group discussion, taking initiative but also very pleasant and able to hold space for others to share.  Miguel [name] HERO.Div

## 2024-07-23 NOTE — Group Note (Signed)
 Date:  07/23/2024 Time:  11:24 AM  Group Topic/Focus:  Wellness Toolbox:   The focus of this group is to discuss various aspects of wellness, balancing those aspects and exploring ways to increase the ability to experience wellness.  Patients will create a wellness toolbox for use upon discharge.    Participation Level:  Active  Participation Quality:  Appropriate and Intrusive  Affect:  Appropriate, Blunted, and Not Congruent  Cognitive:  Alert and Delusional  Insight: Limited  Engagement in Group:  Engaged  Modes of Intervention:  Activity and Discussion  Additional Comments:     Maglione,Jakala Herford E 07/23/2024, 11:24 AM

## 2024-07-24 NOTE — Group Note (Signed)
 Recreation Therapy Group Note   Group Topic:Leisure Education  Group Date: 07/24/2024 Start Time: 1215 End Time: 1300 Facilitators: Celestia Jeoffrey BRAVO, LRT, CTRS Location: Dayroom  Group Description: Bingo. Patients played multiple rounds of bingo. LRT and pts discussed the definition of leisure, things they do in their free time outside of the hospital, and how bingo is also a leisure activity. Pts received a journal or coloring book as their bingo prize.   Goal Area(s) Addressed:  Patient will identify a current leisure interest.  Patient will learn the definition of "leisure". Patient will have the opportunity to try a new leisure activity. Patient will communicate with peers and LRT.   Affect/Mood: Flat   Participation Level: Minimal   Participation Quality: Minimal Cues    Clinical Observations/Individualized Feedback: Miguel [name] was minimally active in their participation of session activities and group discussion. Pt seemed confused and had trouble playing bingo. LRT and peers assisted pt when needed, pt was receptive to help. Pt received a journal as a prize.    Plan: Continue to engage patient in RT group sessions 2-3x/week.   Jeoffrey BRAVO Celestia, LRT, CTRS 07/24/2024 1:59 PM

## 2024-07-24 NOTE — Plan of Care (Signed)
  Problem: Activity: Goal: Interest or engagement in activities will improve Outcome: Progressing   Problem: Coping: Goal: Ability to verbalize frustrations and anger appropriately will improve Outcome: Progressing   Problem: Activity: Goal: Interest or engagement in activities will improve Outcome: Progressing   Problem: Education: Goal: Verbalization of understanding the information provided will improve Outcome: Not Progressing

## 2024-07-24 NOTE — Group Note (Signed)
 Date:  07/24/2024 Time:  10:36 AM  Group Topic/Focus:  Dimensions of Wellness:   The focus of this group is to introduce the topic of wellness and discuss the role each dimension of wellness plays in total health. Self Care:   The focus of this group is to help patients understand the importance of self-care in order to improve or restore emotional, physical, spiritual, interpersonal, and financial health.    Participation Level:  Minimal  Participation Quality:  Appropriate and Inattentive  Affect:  Appropriate  Cognitive:  Appropriate  Insight: Appropriate  Engagement in Group:  Lacking  Modes of Intervention:  Activity  Additional Comments:  Pt did attend afternoon group session today. Although participation level was minimal, pt was supportive of those who did participate in today's activity. Will continue to motivate pt to continue to attend but actively participate as well.  Brad GORMAN Ryder 07/24/2024, 10:36 AM

## 2024-07-24 NOTE — Progress Notes (Signed)
 [facility] MD Progress Note  07/24/2024 12:44 PM Miguel [name]  MRN:  [id number]  Miguel [name]. Miguel [name] is a 73 year old male with a past psychiatric history significant for schizophrenia who presents under involuntary commitment initiated by family due to medication non-adherence, increasing paranoia, and behavioral disorganization. 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 Brother reports multiple episodes of patient whispering through the night, stating people were talking about him and planning to harm him. Patient was advised by his ACT Team to come to the ED; he refused, prompting the family to pursue IVC.Patient is admitted to [facility] unit with Q15 min safety monitoring. Multidisciplinary team approach is offered. Medication management; group/milieu therapy is offered.  Subjective:  Chart reviewed, case discussed in multidisciplinary meeting, patient seen during rounds.   Patient is noted to be walking in the hallways.  He offers no complaints.  He continues to remain delusional about not having any medical problems or psychiatric problems and not needing any medications.  Every day takes 30 minutes to educate and encourage him to take his medications by the nursing staff.  Patient remains confused about the events that led up to his admission.  Patient denies SI/HI/plan and denies auditory/visual hallucinations  Provider called patient's family Past Psychiatric History: see h&P Family History:  Family History  Family history unknown: Yes   Social History:  Social History    Substance and Sexual Activity  Alcohol Use No     Social History   Substance and Sexual Activity  Drug Use No    Social History   Socioeconomic History   Marital status: Divorced    Spouse name: Not on file   Number of children: Not on file   Years of education: Not on file   Highest education level: Not on file  Occupational History   Not on file  Tobacco Use   Smoking status: Every Day    Current packs/day: 0.50    Average packs/day: 0.5 packs/day for 45.9 years (23.0 ttl pk-yrs)    Types: Cigarettes    Start date: 76   Smokeless tobacco: Never  Vaping Use   Vaping status: Never Used  Substance and Sexual Activity   Alcohol use: No   Drug use: No   Sexual activity: Not on file  Other Topics Concern   Not on file  Social History Narrative   Not on file   Social Drivers of Health   Financial Resource Strain: Not on file  Food Insecurity: Patient Unable To Answer (07/18/2024)   Hunger Vital Sign    Worried About Running Out of Food in the Last Year: Patient unable to answer    Ran Out of Food in the Last Year: Patient unable to answer  Transportation Needs: Unmet Transportation Needs (07/18/2024)   PRAPARE - Administrator, Civil Service (Medical): Yes    Lack of Transportation (Non-Medical): Yes  Physical Activity: Not on file  Stress: Not on file  Social Connections: Patient Unable To Answer (07/18/2024)   Social Connection and Isolation Panel    Frequency of Communication with Friends and Family: Patient unable to answer    Frequency of Social Gatherings with Friends and Family: Patient unable  to answer    Attends Religious Services: Patient unable to answer    Active Member of Clubs or Organizations: Patient unable to answer    Attends Club or Organization Meetings: Patient unable to answer    Marital Status: Patient unable to answer   Past Medical History:  Past Medical History:  Diagnosis Date   Biventricular failure (HCC)    CVA  (cerebral vascular accident) (HCC)    Polysubstance use disorder    Schizophrenia (HCC)     Past Surgical History:  Procedure Laterality Date   APPENDECTOMY     TRACHEOSTOMY TUBE PLACEMENT N/A 03/04/2024   Procedure: CREATION, TRACHEOSTOMY;  Surgeon: [name] Massie RAMAN, MD;  Location: [facility] ORS;  Service: ENT;  Laterality: N/A;    Current Medications: Current Facility-Administered Medications  Medication Dose Route Frequency Provider Last Rate Last Admin   acetaminophen  (TYLENOL ) tablet 650 mg  650 mg Oral Q6H PRN Hampton, Tracie B, NP   650 mg at 07/24/24 0549   alum & mag hydroxide-simeth (MAALOX/MYLANTA) 200-200-20 MG/5ML suspension 30 mL  30 mL Oral Q4H PRN Hampton, Tracie B, NP   30 mL at 07/22/24 1639   amiodarone  (PACERONE ) tablet 400 mg  400 mg Oral Daily [name], [name], MD   400 mg at 07/24/24 0916   ARIPiprazole  (ABILIFY ) tablet 30 mg  30 mg Oral Daily Patric Vanpelt, MD   30 mg at 07/24/24 0916   ascorbic acid  (VITAMIN C ) tablet 500 mg  500 mg Oral BID [name], [name], MD   500 mg at 07/24/24 0916   feeding supplement (ENSURE PLUS HIGH PROTEIN) liquid 237 mL  237 mL Oral TID BM [name], [name], MD   237 mL at 07/24/24 0950   hydrOXYzine  (ATARAX ) tablet 25 mg  25 mg Oral TID PRN Hampton, Tracie B, NP   25 mg at 07/22/24 2117   magnesium  hydroxide (MILK OF MAGNESIA) suspension 30 mL  30 mL Oral Daily PRN Hampton, Tracie B, NP       metoprolol  tartrate (LOPRESSOR ) tablet 25 mg  25 mg Oral BID [name], [name], MD   25 mg at 07/24/24 9083   multivitamin with minerals tablet 1 tablet  1 tablet Oral Daily [name], [name], MD   1 tablet at 07/24/24 0916   OLANZapine  (ZYPREXA ) injection 5 mg  5 mg Intramuscular TID PRN Hampton, Tracie B, NP       OLANZapine  zydis (ZYPREXA ) disintegrating tablet 5 mg  5 mg Oral TID PRN Hampton, Tracie B, NP       oxyCODONE  (Oxy IR/ROXICODONE ) immediate release tablet 5 mg  5 mg Oral Q4H PRN Dorinda Drue DASEN, MD   5 mg at 07/21/24 9366   pantoprazole   (PROTONIX ) EC tablet 40 mg  40 mg Oral Daily [name], [name], MD   40 mg at 07/24/24 0916   traZODone  (DESYREL ) tablet 50 mg  50 mg Oral QHS PRN Hampton, Tracie B, NP   50 mg at 07/23/24 2050   zinc  sulfate (50mg  elemental zinc ) capsule 220 mg  220 mg Oral Daily [name], [name], MD   220 mg at 07/24/24 9083    Lab Results:  No results found for this or any previous visit (from the past 48 hours).   Blood Alcohol level:  Lab Results  Component Value Date   [facility] <15 07/17/2024   ETH <15 02/20/2024    Metabolic Disorder Labs: Lab Results  Component Value Date   HGBA1C 5.8 (H) 07/14/2018  MPG 119.76 07/14/2018   MPG 131.24 07/13/2018   No results found for: PROLACTIN Lab Results  Component Value Date   CHOL 170 07/13/2018   TRIG 34 03/08/2024   HDL 41 07/13/2018   CHOLHDL 4.1 07/13/2018   VLDL 10 07/13/2018   LDLCALC 119 (H) 07/13/2018    Physical Findings: AIMS:  , ,  ,  ,    CIWA:    COWS:      Psychiatric Specialty Exam:  Presentation  General Appearance: Appropriate for Environment  Eye Contact:Fleeting  Speech:Normal Rate  Speech Volume:Decreased    Mood and Affect  Mood:Anxious  Affect:Flat   Thought Process  Thought Processes:Disorganized  Orientation:Partial  Thought Content:Illogical; Paranoid Ideation  Hallucinations:denies  Ideas of Reference:Delusions  Suicidal Thoughts:denies  Homicidal Thoughts:denies   Sensorium  Memory:Immediate Poor; Recent Poor; Remote Poor  Judgment:Impaired  Insight:None   Executive Functions  Concentration:Poor  Attention Span:Poor  Recall:Poor  Fund of Knowledge:Poor  Language:Fair   Psychomotor Activity  Psychomotor Activity:No data recorded  Musculoskeletal: Strength & Muscle Tone: within normal limits Gait & Station: normal Assets  Assets:Communication Skills; Desire for Improvement; Social Support    Physical Exam: Physical Exam Vitals and nursing note reviewed.     ROS Blood pressure (!) 120/59, pulse 74, temperature (!) 97.2 F (36.2 C), resp. rate 19, height 6' 1 (1.854 m), weight 81.2 kg, SpO2 95%. Body mass index is 23.62 kg/m.  Diagnosis: Active Problems:   Schizophrenia, paranoid (HCC)   Dementia with behavioral disturbance (HCC)   PLAN: Safety and Monitoring:  -- Involuntary admission to inpatient psychiatric unit for safety, stabilization and treatment  -- Daily contact with patient to assess and evaluate symptoms and progress in treatment  -- Patient's case to be discussed in multi-disciplinary team meeting  -- Observation Level : q15 minute checks  -- Vital signs:  q12 hours  -- Precautions: suicide, elopement, and assault -- Encouraged patient to participate in unit milieu and in scheduled group therapies  2. Psychiatric Treatment:  Scheduled Medications:  Abilify  30 mg daily   -- The risks/benefits/side-effects/alternatives to this medication were discussed in detail with the patient and time was given for questions. The patient consents to medication trial.  3. Medical Issues Being Addressed:  [facility] consulted for wound care management   Assessment and Plan:   Sacral Decubitus Ulcer Full thickness decubitus ulcer in setting of recent extended hospitalization, discharged from [facility] Nov 14th Wound looks pretty good overall WOC on board we appreciate input   Cognitive Deficit Concern for anoxic brain injury at discharge 02/2024 Delirium precautions   Peripheral Vascular Disease Legs with features c/w peripheral vascular disease, diminished DP pulses Would benefit from outpatient ABI's   HFrEF Euvolemic at this time Continue home medications   Hx Atrial Flutter Not on any anticoagulation at this time EKG with NSR Continue amiodarone   Hypertension Noted, adjust meds gradually after med rec complete     4. Discharge Planning:   -- Social work and case management to assist with discharge planning and  identification of hospital follow-up needs prior to discharge  -- Estimated LOS: 3-4 days  Allyn Foil, MD 07/24/2024, 12:44 PM

## 2024-07-24 NOTE — Group Note (Deleted)
 Recreation Therapy Group Note   Group Topic:Leisure Education  Group Date: 07/24/2024 Start Time: 1120 End Time: 1200 Facilitators: Celestia Jeoffrey BRAVO, LRT Location: Craft Room  Group Description: Leisure. Patients were given the option to choose from journaling, coloring, drawing, making origami, playing with playdoh, listening to music or singing karaoke. LRT and pts discussed the meaning of leisure, the importance of participating in leisure during their free time/when they're outside of the hospital, as well as how our leisure interests can also serve as coping skills.   Goal Area(s) Addressed:  Patient will identify a current leisure interest.  Patient will learn the definition of "leisure". Patient will practice making a positive decision. Patient will have the opportunity to try a new leisure activity. Patient will communicate with peers and LRT.      Affect/Mood: {RT BHH Affect/Mood:26271}   Participation Level: {RT BHH Participation Level:26267}   Participation Quality: {RT BHH Participation Quality:26268}   Behavior: {RT BHH Group Behavior:26269}   Speech/Thought Process: {RT BHH Speech/Thought:26276}   Insight: {RT BHH Insight:26272}   Judgement: {RT BHH Judgement:26278}   Modes of Intervention: {RT BHH Modes of Intervention:26277}   Patient Response to Interventions:  {RT BHH Patient Response to Intervention:26274}   Education Outcome:  {RT BHH Education Outcome:26279}   Clinical Observations/Individualized Feedback: *** was *** in their participation of session activities and group discussion. Pt identified ***   Plan: {RT BHH Tx Eojw:73719}   Jeoffrey BRAVO Celestia, LRT,  07/24/2024 1:30 PM

## 2024-07-24 NOTE — Progress Notes (Signed)
 Behavior:  Cooperative. Mild confusion (Does not acknowledge MI).    Psych assessment: Denies SI/HI and AVH.    Interaction / Group attendance:  Present in the milieu.  Appropriate interaction with peers.  Argumentative with staff, mostly when administering medications.   Medication/ PRNs: Compliant with encouragement from multiple staff members- took roughly 20 mins.   Pain: Denies.  15 min checks in place for safety.

## 2024-07-24 NOTE — Plan of Care (Signed)
  Problem: Education: Goal: Knowledge of [place] [facility] Education information/materials will improve Outcome: Progressing   Problem: Activity: Goal: Interest or engagement in activities will improve Outcome: Progressing   Problem: Coping: Goal: Ability to verbalize frustrations and anger appropriately will improve Outcome: Progressing   Problem: Education: Goal: Knowledge of  General Education information/materials will improve Outcome: Progressing Goal: Emotional status will improve Outcome: Progressing Goal: Mental status will improve Outcome: Progressing Goal: Verbalization of understanding the information provided will improve Outcome: Progressing   Problem: Activity: Goal: Interest or engagement in activities will improve Outcome: Progressing Goal: Sleeping patterns will improve Outcome: Progressing   Problem: Coping: Goal: Ability to verbalize frustrations and anger appropriately will improve Outcome: Progressing Goal: Ability to demonstrate self-control will improve Outcome: Progressing   Problem: Health Behavior/Discharge Planning: Goal: Identification of resources available to assist in meeting health care needs will improve Outcome: Progressing Goal: Compliance with treatment plan for underlying cause of condition will improve Outcome: Progressing   Problem: Physical Regulation: Goal: Ability to maintain clinical measurements within normal limits will improve Outcome: Progressing   Problem: Safety: Goal: Periods of time without injury will increase Outcome: Progressing   Problem: Coping: Goal: Ability to adjust to condition or change in health will improve Outcome: Progressing   Problem: Health Behavior/Discharge Planning: Goal: Ability to identify and utilize available resources and services will improve Outcome: Progressing

## 2024-07-24 NOTE — Group Note (Signed)
 Date:  07/24/2024 Time:  9:39 PM  Group Topic/Focus:  Coping With Mental Health Crisis:   The purpose of this group is to help patients identify strategies for coping with mental health crisis.  Group discusses possible causes of crisis and ways to manage them effectively.    Participation Level:  Did Not Attend  Leigh VEAR Pais 07/24/2024, 9:39 PM

## 2024-07-24 NOTE — Progress Notes (Signed)
 Patient declined having his wound cleaned and new dressing applied.

## 2024-07-25 NOTE — Progress Notes (Signed)
 Patient refused wound care.

## 2024-07-25 NOTE — Progress Notes (Signed)
   07/25/24 1100  Psych Admission Type (Psych Patients Only)  Admission Status Involuntary  Psychosocial Assessment  Patient Complaints None  Eye Contact Brief  Facial Expression Animated  Affect Appropriate to circumstance  Speech Logical/coherent  Interaction Assertive  Motor Activity Slow  Appearance/Hygiene Unremarkable  Behavior Characteristics Cooperative  Mood Pleasant  Thought Process  Coherency WDL  Content WDL  Delusions None reported or observed  Perception WDL  Hallucination None reported or observed  Judgment Impaired  Confusion Mild  Danger to Self  Current suicidal ideation? Denies  Danger to Others  Danger to Others None reported or observed

## 2024-07-25 NOTE — Plan of Care (Signed)
  Problem: Education: Goal: Knowledge of [place] [facility] Education information/materials will improve Outcome: Progressing   Problem: Activity: Goal: Interest or engagement in activities will improve Outcome: Progressing   Problem: Coping: Goal: Ability to verbalize frustrations and anger appropriately will improve Outcome: Progressing   Problem: Education: Goal: Knowledge of  General Education information/materials will improve Outcome: Progressing Goal: Emotional status will improve Outcome: Progressing Goal: Mental status will improve Outcome: Progressing Goal: Verbalization of understanding the information provided will improve Outcome: Progressing   Problem: Activity: Goal: Interest or engagement in activities will improve Outcome: Progressing Goal: Sleeping patterns will improve Outcome: Progressing   Problem: Coping: Goal: Ability to verbalize frustrations and anger appropriately will improve Outcome: Progressing Goal: Ability to demonstrate self-control will improve Outcome: Progressing   Problem: Health Behavior/Discharge Planning: Goal: Identification of resources available to assist in meeting health care needs will improve Outcome: Progressing Goal: Compliance with treatment plan for underlying cause of condition will improve Outcome: Progressing   Problem: Physical Regulation: Goal: Ability to maintain clinical measurements within normal limits will improve Outcome: Progressing   Problem: Safety: Goal: Periods of time without injury will increase Outcome: Progressing   Problem: Coping: Goal: Ability to adjust to condition or change in health will improve Outcome: Progressing   Problem: Health Behavior/Discharge Planning: Goal: Ability to identify and utilize available resources and services will improve Outcome: Progressing

## 2024-07-25 NOTE — Group Note (Signed)
 Date:  07/25/2024 Time:  11:11 PM  Group Topic/Focus:  Healthy Communication:   The focus of this group is to discuss communication, barriers to communication, as well as healthy ways to communicate with others.    Participation Level:  Active  Participation Quality:  Appropriate  Affect:  Appropriate  Cognitive:  Appropriate  Insight: Good  Engagement in Group:  Engaged  Modes of Intervention:  Discussion  Additional Comments:    Miguel [name] 07/25/2024, 11:11 PM

## 2024-07-25 NOTE — Progress Notes (Signed)
 [facility] MD Progress Note  07/25/2024 2:07 PM Miguel [name]  MRN:  [id number]  Miguel [name] is a 73 year old male with a past psychiatric history significant for schizophrenia who presents under involuntary commitment initiated by family due to medication non-adherence, increasing paranoia, and behavioral disorganization. 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 Brother reports multiple episodes of patient whispering through the night, stating people were talking about him and planning to harm him. Patient was advised by his ACT Team to come to the ED; he refused, prompting the family to pursue IVC.Patient is admitted to [facility] unit with Q15 min safety monitoring. Multidisciplinary team approach is offered. Medication management; group/milieu therapy is offered.  Subjective:  Chart reviewed, case discussed in multidisciplinary meeting, patient seen during rounds.   Patient is noted to be sitting in the day area.  He offers no complaints.  He reports that staff members are helping him to clean the wound and he is able to clean the wound himself.  He talks about his family.  He is taking his medications per nursing report but needs a lot of encouragement provided discussed about getting him Abilify  LAI to minimize the number of pills he takes and patient is agreeable to it.  He denies SI/HI/plan and denies feeling depressed or anxious.  He denies auditory/visual hallucinations. Past Psychiatric History: see h&P Family History:  Family History  Family history unknown: Yes    Social History:  Social History   Substance and Sexual Activity  Alcohol Use No     Social History   Substance and Sexual Activity  Drug Use No    Social History   Socioeconomic History   Marital status: Divorced    Spouse name: Not on file   Number of children: Not on file   Years of education: Not on file   Highest education level: Not on file  Occupational History   Not on file  Tobacco Use   Smoking status: Every Day    Current packs/day: 0.50    Average packs/day: 0.5 packs/day for 45.9 years (23.0 ttl pk-yrs)    Types: Cigarettes    Start date: 82   Smokeless tobacco: Never  Vaping Use   Vaping status: Never Used  Substance and Sexual Activity   Alcohol use: No   Drug use: No   Sexual activity: Not on file  Other Topics Concern   Not on file  Social History Narrative   Not on file   Social Drivers of Health   Financial Resource Strain: Not on file  Food Insecurity: Patient Unable To Answer (07/18/2024)   Hunger Vital Sign    Worried About Running Out of Food in the Last Year: Patient unable to answer    Ran Out of Food in the Last Year: Patient unable to answer  Transportation Needs: Unmet Transportation Needs (07/18/2024)   PRAPARE - Administrator, Civil Service (Medical): Yes    Lack of Transportation (Non-Medical): Yes  Physical Activity: Not on file  Stress: Not on file  Social Connections: Patient Unable To Answer (07/18/2024)   Social Connection and Isolation Panel    Frequency of Communication with Friends and Family: Patient unable  to answer    Frequency of Social Gatherings with Friends and Family: Patient unable to answer    Attends Religious Services: Patient unable to answer    Active Member of Clubs or Organizations: Patient unable to answer    Attends Banker Meetings: Patient unable to answer    Marital Status: Patient unable to answer   Past Medical History:  Past Medical History:  Diagnosis Date    Biventricular failure (HCC)    CVA (cerebral vascular accident) (HCC)    Polysubstance use disorder    Schizophrenia (HCC)     Past Surgical History:  Procedure Laterality Date   APPENDECTOMY     TRACHEOSTOMY TUBE PLACEMENT N/A 03/04/2024   Procedure: CREATION, TRACHEOSTOMY;  Surgeon: [name] Massie RAMAN, MD;  Location: [facility] ORS;  Service: ENT;  Laterality: N/A;    Current Medications: Current Facility-Administered Medications  Medication Dose Route Frequency Provider Last Rate Last Admin   acetaminophen  (TYLENOL ) tablet 650 mg  650 mg Oral Q6H PRN Hampton, Tracie B, NP   650 mg at 07/25/24 1013   alum & mag hydroxide-simeth (MAALOX/MYLANTA) 200-200-20 MG/5ML suspension 30 mL  30 mL Oral Q4H PRN Hampton, Tracie B, NP   30 mL at 07/22/24 1639   amiodarone  (PACERONE ) tablet 400 mg  400 mg Oral Daily [name], [name], MD   400 mg at 07/25/24 1013   ARIPiprazole  (ABILIFY ) tablet 30 mg  30 mg Oral Daily Tim Corriher, MD   30 mg at 07/25/24 1013   ascorbic acid  (VITAMIN C ) tablet 500 mg  500 mg Oral BID [name], [name], MD   500 mg at 07/25/24 1013   feeding supplement (ENSURE PLUS HIGH PROTEIN) liquid 237 mL  237 mL Oral TID BM [name], [name], MD   237 mL at 07/24/24 2100   hydrOXYzine  (ATARAX ) tablet 25 mg  25 mg Oral TID PRN Hampton, Tracie B, NP   25 mg at 07/24/24 2116   magnesium  hydroxide (MILK OF MAGNESIA) suspension 30 mL  30 mL Oral Daily PRN Hampton, Tracie B, NP       metoprolol  tartrate (LOPRESSOR ) tablet 25 mg  25 mg Oral BID Dorinda Homans T, MD   25 mg at 07/25/24 1013   multivitamin with minerals tablet 1 tablet  1 tablet Oral Daily [name], [name], MD   1 tablet at 07/25/24 1013   OLANZapine  (ZYPREXA ) injection 5 mg  5 mg Intramuscular TID PRN Hampton, Tracie B, NP       OLANZapine  zydis (ZYPREXA ) disintegrating tablet 5 mg  5 mg Oral TID PRN Hampton, Tracie B, NP       oxyCODONE  (Oxy IR/ROXICODONE ) immediate release tablet 5 mg  5 mg Oral Q4H PRN Dorinda Homans DASEN, MD   5 mg at  07/21/24 9366   pantoprazole  (PROTONIX ) EC tablet 40 mg  40 mg Oral Daily [name], [name], MD   40 mg at 07/25/24 1013   traZODone  (DESYREL ) tablet 50 mg  50 mg Oral QHS PRN Hampton, Tracie B, NP   50 mg at 07/24/24 2116   zinc  sulfate (50mg  elemental zinc ) capsule 220 mg  220 mg Oral Daily [name], [name], MD   220 mg at 07/25/24 1013    Lab Results:  No results found for this or any previous visit (from the past 48 hours).   Blood Alcohol level:  Lab Results  Component Value Date   [facility] <15 07/17/2024   [facility] <15 02/20/2024    Metabolic Disorder  Labs: Lab Results  Component Value Date   HGBA1C 5.8 (H) 07/14/2018   MPG 119.76 07/14/2018   MPG 131.24 07/13/2018   No results found for: PROLACTIN Lab Results  Component Value Date   CHOL 170 07/13/2018   TRIG 34 03/08/2024   HDL 41 07/13/2018   CHOLHDL 4.1 07/13/2018   VLDL 10 07/13/2018   LDLCALC 119 (H) 07/13/2018    Physical Findings: AIMS:  , ,  ,  ,    CIWA:    COWS:      Psychiatric Specialty Exam:  Presentation  General Appearance: Appropriate for Environment  Eye Contact:Fleeting  Speech:Normal Rate  Speech Volume:Decreased    Mood and Affect  Mood:Anxious  Affect:Flat   Thought Process  Thought Processes:Disorganized  Orientation:Partial  Thought Content:Illogical; Paranoid Ideation  Hallucinations:denies  Ideas of Reference:Delusions  Suicidal Thoughts:denies  Homicidal Thoughts:denies   Sensorium  Memory:Immediate Poor; Recent Poor; Remote Poor  Judgment:Impaired  Insight:None   Executive Functions  Concentration:Poor  Attention Span:Poor  Recall:Poor  Fund of Knowledge:Poor  Language:Fair   Psychomotor Activity  Psychomotor Activity:No data recorded  Musculoskeletal: Strength & Muscle Tone: within normal limits Gait & Station: normal Assets  Assets:Communication Skills; Desire for Improvement; Social Support    Physical Exam: Physical Exam Vitals and  nursing note reviewed.    ROS Blood pressure (!) 120/59, pulse 74, temperature (!) 97.2 F (36.2 C), resp. rate 19, height 6' 1 (1.854 m), weight 81.2 kg, SpO2 95%. Body mass index is 23.62 kg/m.  Diagnosis: Active Problems:   Schizophrenia, paranoid (HCC)   Dementia with behavioral disturbance (HCC)   PLAN: Safety and Monitoring:  -- Involuntary admission to inpatient psychiatric unit for safety, stabilization and treatment  -- Daily contact with patient to assess and evaluate symptoms and progress in treatment  -- Patient's case to be discussed in multi-disciplinary team meeting  -- Observation Level : q15 minute checks  -- Vital signs:  q12 hours  -- Precautions: suicide, elopement, and assault -- Encouraged patient to participate in unit milieu and in scheduled group therapies  2. Psychiatric Treatment:  Scheduled Medications:  Abilify  30 mg daily- will consider LAI   -- The risks/benefits/side-effects/alternatives to this medication were discussed in detail with the patient and time was given for questions. The patient consents to medication trial.  3. Medical Issues Being Addressed:  [facility] consulted for wound care management   Assessment and Plan:   Sacral Decubitus Ulcer Full thickness decubitus ulcer in setting of recent extended hospitalization, discharged from [facility] Nov 14th Wound looks pretty good overall WOC on board we appreciate input   Cognitive Deficit Concern for anoxic brain injury at discharge 02/2024 Delirium precautions   Peripheral Vascular Disease Legs with features c/w peripheral vascular disease, diminished DP pulses Would benefit from outpatient ABI's   HFrEF Euvolemic at this time Continue home medications   Hx Atrial Flutter Not on any anticoagulation at this time EKG with NSR Continue amiodarone   Hypertension Noted, adjust meds gradually after med rec complete     4. Discharge Planning:   -- Social work and case management to  assist with discharge planning and identification of hospital follow-up needs prior to discharge  -- Estimated LOS: 3-4 days  Allyn Foil, MD 07/25/2024, 2:07 PM

## 2024-07-25 NOTE — Progress Notes (Signed)
   07/25/24 2200  Psych Admission Type (Psych Patients Only)  Admission Status Involuntary  Psychosocial Assessment  Patient Complaints None  Eye Contact Brief  Facial Expression Animated  Affect Appropriate to circumstance  Speech Logical/coherent  Interaction Assertive  Motor Activity Slow  Appearance/Hygiene Unremarkable  Behavior Characteristics Cooperative  Mood Pleasant  Thought Process  Coherency Circumstantial  Content WDL  Delusions None reported or observed  Perception WDL  Hallucination None reported or observed  Judgment Impaired  Confusion Mild  Danger to Self  Current suicidal ideation? Denies

## 2024-07-25 NOTE — Group Note (Signed)
 Date:  07/25/2024 Time:  11:53 AM  Group Topic/Focus:  Coping With Mental Health Crisis:   The purpose of this group is to help patients identify strategies for coping with mental health crisis.  Group discusses possible causes of crisis and ways to manage them effectively. Conflict Resolution:   The focus of this group is to discuss the conflict resolution process and how it may be used upon discharge. Healthy Communication:   The focus of this group is to discuss communication, barriers to communication, as well as healthy ways to communicate with others.    Participation Level:  Active  Participation Quality:  Appropriate  Affect:  Appropriate  Cognitive:  Disorganized and Confused  Insight: Appropriate   Engagement in Group:  Engaged  Modes of Intervention:  Discussion and activity   Additional Comments:    Miguel [name] [name] 07/25/2024, 11:53 AM

## 2024-07-26 MED ORDER — ARIPIPRAZOLE ER 400 MG IM SRER
400.0000 mg | INTRAMUSCULAR | Status: AC
  Administered 2024-07-27 – 2024-09-21 (×3): 400 mg via INTRAMUSCULAR
  Filled 2024-07-26 (×2): qty 2

## 2024-07-26 NOTE — Progress Notes (Signed)
   07/26/24 0900  Psych Admission Type (Psych Patients Only)  Admission Status Involuntary  Psychosocial Assessment  Patient Complaints None  Eye Contact Brief  Facial Expression Animated  Affect Blunted  Speech Tangential  Interaction Guarded;Minimal  Motor Activity Slow  Appearance/Hygiene Unremarkable  Behavior Characteristics Cooperative  Mood Irritable  Thought Process  Coherency Circumstantial  Content Preoccupation  Delusions None reported or observed  Perception WDL  Hallucination None reported or observed  Judgment Impaired  Confusion Mild  Danger to Self  Current suicidal ideation? Denies  Agreement Not to Harm Self Yes  Description of Agreement verbal  Danger to Others  Danger to Others None reported or observed

## 2024-07-26 NOTE — Plan of Care (Signed)
  Problem: Coping: Goal: Ability to verbalize frustrations and anger appropriately will improve Outcome: Progressing   Problem: Coping: Goal: Ability to verbalize frustrations and anger appropriately will improve Outcome: Progressing   Problem: Safety: Goal: Periods of time without injury will increase Outcome: Progressing

## 2024-07-26 NOTE — BH IP Treatment Plan (Signed)
 Interdisciplinary Treatment and Diagnostic Plan Update  07/26/2024 Time of Session: 3:00 PM Miguel [name] MRN: [id number]  Principal Diagnosis: <principal problem not specified>  Secondary Diagnoses: Active Problems:   Schizophrenia, paranoid (HCC)   Dementia with behavioral disturbance (HCC)   Current Medications:  Current Facility-Administered Medications  Medication Dose Route Frequency Provider Last Rate Last Admin   acetaminophen  (TYLENOL ) tablet 650 mg  650 mg Oral Q6H PRN Hampton, Tracie B, NP   650 mg at 07/25/24 1013   alum & mag hydroxide-simeth (MAALOX/MYLANTA) 200-200-20 MG/5ML suspension 30 mL  30 mL Oral Q4H PRN Hampton, Tracie B, NP   30 mL at 07/22/24 1639   amiodarone  (PACERONE ) tablet 400 mg  400 mg Oral Daily [name], [name], MD   400 mg at 07/25/24 1013   ARIPiprazole  (ABILIFY ) tablet 30 mg  30 mg Oral Daily [name], [name], MD   30 mg at 07/25/24 1013   ascorbic acid  (VITAMIN C ) tablet 500 mg  500 mg Oral BID [name], [name], MD   500 mg at 07/25/24 2107   feeding supplement (ENSURE PLUS HIGH PROTEIN) liquid 237 mL  237 mL Oral TID BM [name], [name], MD   237 mL at 07/24/24 2100   hydrOXYzine  (ATARAX ) tablet 25 mg  25 mg Oral TID PRN Hampton, Tracie B, NP   25 mg at 07/25/24 2108   magnesium  hydroxide (MILK OF MAGNESIA) suspension 30 mL  30 mL Oral Daily PRN Hampton, Tracie B, NP       metoprolol  tartrate (LOPRESSOR ) tablet 25 mg  25 mg Oral BID [name], [name], MD   25 mg at 07/25/24 1013   multivitamin with minerals tablet 1 tablet  1 tablet Oral Daily [name], [name], MD   1 tablet at 07/25/24 1013   OLANZapine  (ZYPREXA ) injection 5 mg  5 mg Intramuscular TID PRN Hampton, Tracie B, NP       OLANZapine  zydis (ZYPREXA ) disintegrating tablet 5 mg  5 mg Oral TID PRN Hampton, Tracie B, NP       oxyCODONE  (Oxy IR/ROXICODONE ) immediate release tablet 5 mg  5 mg Oral Q4H PRN [name], [name], MD   5 mg at 07/25/24 2156   pantoprazole  (PROTONIX ) EC tablet 40 mg  40  mg Oral Daily [name], [name], MD   40 mg at 07/25/24 1013   traZODone  (DESYREL ) tablet 50 mg  50 mg Oral QHS PRN Hampton, Tracie B, NP   50 mg at 07/25/24 2107   zinc  sulfate (50mg  elemental zinc ) capsule 220 mg  220 mg Oral Daily [name], [name], MD   220 mg at 07/25/24 1013   PTA Medications: Medications Prior to Admission  Medication Sig Dispense Refill Last Dose/Taking   amiodarone  (PACERONE ) 400 MG tablet Place 1 tablet (400 mg total) into feeding tube daily.      ARIPiprazole  (ABILIFY ) 30 MG tablet Place 1 tablet (30 mg total) into feeding tube at bedtime.      bisacodyl  (DULCOLAX) 10 MG suppository Place 1 suppository (10 mg total) rectally daily as needed for moderate constipation or severe constipation.      busPIRone  (BUSPAR ) 5 MG tablet Take 5 mg by mouth 3 (three) times daily.      Chlorhexidine  Gluconate Cloth 2 % PADS Apply 6 each topically daily.      digoxin  (LANOXIN ) 0.125 MG tablet 1 tablet (0.125 mg total) by Per NG tube route daily. (Patient taking differently: Take 0.125 mg by mouth every other day.)  docusate (COLACE) 50 MG/5ML liquid Place 10 mLs (100 mg total) into feeding tube 2 (two) times daily.      metoprolol  tartrate (LOPRESSOR ) 25 MG tablet Take 25 mg by mouth 2 (two) times daily.      pantoprazole  (PROTONIX ) 40 MG tablet Take 40 mg by mouth daily.      traMADol  (ULTRAM ) 50 MG tablet Take 50 mg by mouth 2 (two) times daily.       Patient Stressors: Educational concerns   Health problems   Medication change or noncompliance   Other: Stated transitional housing concerns    Patient Strengths: Manufacturing systems engineer  Religious Affiliation   Treatment Modalities: Medication Management, Group therapy, Case management,  1 to 1 session with clinician, Psychoeducation, Recreational therapy.   Physician Treatment Plan for Primary Diagnosis: <principal problem not specified> Long Term Goal(s):     Short Term Goals:    Medication Management: Evaluate  patient's response, side effects, and tolerance of medication regimen.  Therapeutic Interventions: 1 to 1 sessions, Unit Group sessions and Medication administration.  Evaluation of Outcomes: Progressing  Physician Treatment Plan for Secondary Diagnosis: Active Problems:   Schizophrenia, paranoid (HCC)   Dementia with behavioral disturbance (HCC)  Long Term Goal(s):     Short Term Goals:       Medication Management: Evaluate patient's response, side effects, and tolerance of medication regimen.  Therapeutic Interventions: 1 to 1 sessions, Unit Group sessions and Medication administration.  Evaluation of Outcomes: Progressing   RN Treatment Plan for Primary Diagnosis: <principal problem not specified> Long Term Goal(s): Knowledge of disease and therapeutic regimen to maintain health will improve  Short Term Goals: Ability to remain free from injury will improve, Ability to verbalize frustration and anger appropriately will improve, Ability to demonstrate self-control, Ability to participate in decision making will improve, Ability to verbalize feelings will improve, Ability to disclose and discuss suicidal ideas, Ability to identify and develop effective coping behaviors will improve, and Compliance with prescribed medications will improve  Medication Management: RN will administer medications as ordered by provider, will assess and evaluate patient's response and provide education to patient for prescribed medication. RN will report any adverse and/or side effects to prescribing provider.  Therapeutic Interventions: 1 on 1 counseling sessions, Psychoeducation, Medication administration, Evaluate responses to treatment, Monitor vital signs and CBGs as ordered, Perform/monitor CIWA, COWS, AIMS and Fall Risk screenings as ordered, Perform wound care treatments as ordered.  Evaluation of Outcomes: Progressing   LCSW Treatment Plan for Primary Diagnosis: <principal problem not  specified> Long Term Goal(s): Safe transition to appropriate next level of care at discharge, Engage patient in therapeutic group addressing interpersonal concerns.  Short Term Goals: Engage patient in aftercare planning with referrals and resources, Increase social support, Increase ability to appropriately verbalize feelings, Increase emotional regulation, Facilitate acceptance of mental health diagnosis and concerns, Facilitate patient progression through stages of change regarding substance use diagnoses and concerns, Identify triggers associated with mental health/substance abuse issues, and Increase skills for wellness and recovery  Therapeutic Interventions: Assess for all discharge needs, 1 to 1 time with Social worker, Explore available resources and support systems, Assess for adequacy in community support network, Educate family and significant other(s) on suicide prevention, Complete Psychosocial Assessment, Interpersonal group therapy.  Evaluation of Outcomes: Progressing   Progress in Treatment: Attending groups: Yes. and No. Participating in groups: Yes. and No. Taking medication as prescribed: Yes. Toleration medication: Yes. Family/Significant other contact made: No, will contact:  CSW to contact once  permission is granted.  Patient understands diagnosis: Yes. Discussing patient identified problems/goals with staff: Yes. Medical problems stabilized or resolved: Yes. Denies suicidal/homicidal ideation: Yes. Issues/concerns per patient self-inventory: No. Other: None   New problem(s) identified: No, Describe:  None Update 07/26/24: No changes at this time   New Short Term/Long Term Goal(s):detox, elimination of symptoms of psychosis, medication management for mood stabilization; elimination of SI thoughts; development of comprehensive mental wellness/sobriety plan.  Update 07/26/24: No changes at this time   Patient Goals:  Make sure everything is settled. Update 07/26/24:  No changes at this time   Discharge Plan or Barriers: CSW to assist with the development of appropriate discharge plan.  Update 07/26/24: No changes at this time   Reason for Continuation of Hospitalization: Aggression Anxiety Delusions  Depression Hallucinations Suicidal ideation   Estimated Length of Stay: 1-7 days. Update 07/26/24: TBD Last 3 [place] Suicide Severity Risk Score: Flowsheet Row Admission (Current) from 07/18/2024 in [facility] ED from 07/17/2024 in [facility] Emergency Department [facility] ED to Hosp-Admission (Discharged) from 02/20/2024 in [facility] ICU[facility]CCU  C-SSRS RISK CATEGORY No Risk No Risk No Risk    Last PHQ 2/9 Scores:     No data to display          Scribe for Treatment Team: [name], ISRAEL 07/26/2024 6:50 AM

## 2024-07-26 NOTE — Group Note (Signed)
 Date:  07/26/2024 Time:  9:21 PM  Group Topic/Focus:  Wrap-Up Group:   The focus of this group is to help patients review their daily goal of treatment and discuss progress on daily workbooks.    Participation Level:  Did Not Attend  Participation Quality:     Affect:     Cognitive:     Insight: None  Engagement in Group:  None  Modes of Intervention:     Additional Comments:    Miguel [name] 07/26/2024, 9:21 PM

## 2024-07-26 NOTE — Plan of Care (Signed)
   Problem: Education: Goal: Knowledge of [facility] Education information/materials will improve Outcome: Progressing Goal: Emotional status will improve Outcome: Progressing Goal: Mental status will improve Outcome: Progressing Goal: Verbalization of understanding the information provided will improve Outcome: Progressing

## 2024-07-26 NOTE — Group Note (Signed)
 Date:  07/26/2024 Time:  10:57 AM  Group Topic/Focus:  Goals/Boundaries Group:   The focus of this group is to help patients establish daily goals to achieve during treatment and discuss how the patient can incorporate goal setting into their daily lives to aide in recovery. Patients also learned the different boundaries types and discussed which categories that applied to them.     Participation Level:  Did Not Attend  [name] 07/26/2024, 10:57 AM

## 2024-07-26 NOTE — Group Note (Signed)
 LCSW Group Therapy Note  Group Date: 07/25/2024 Start Time: 1330 End Time: 1400   Type of Therapy and Topic:  Group Therapy - How To Cope with Nervousness about Discharge   Participation Level:  Did Not Attend   Description of Group This process group involved identification of patients' feelings about discharge. Some of them are scheduled to be discharged soon, while others are new admissions, but each of them was asked to share thoughts and feelings surrounding discharge from the hospital. One common theme was that they are excited at the prospect of going home, while another was that many of them are apprehensive about sharing why they were hospitalized. Patients were given the opportunity to discuss these feelings with their peers in preparation for discharge.  Therapeutic Goals  Patient will identify their overall feelings about pending discharge. Patient will think about how they might proactively address issues that they believe will once again arise once they get home (i.e. with parents). Patients will participate in discussion about having hope for change.   Summary of Patient Progress: X   Therapeutic Modalities Cognitive Behavioral Therapy   [name], LCSWA 07/26/2024  6:40 AM

## 2024-07-27 NOTE — Group Note (Signed)
 Date:  07/27/2024 Time:  4:32 PM  Group Topic/Focus:  Coping With Mental Health Crisis:   The purpose of this group is to help patients identify strategies for coping with mental health crisis.  Group discusses possible causes of crisis and ways to manage them effectively.    Participation Level:  Active  Participation Quality:  Appropriate  Affect:  Appropriate  Cognitive:  Appropriate  Insight: Appropriate  Engagement in Group:  Engaged  Modes of Intervention:  Activity  Additional Comments:    Chaney Maclaren 07/27/2024, 4:32 PM

## 2024-07-27 NOTE — Progress Notes (Signed)
   07/27/24 0015  Psych Admission Type (Psych Patients Only)  Admission Status Involuntary  Psychosocial Assessment  Patient Complaints None  Eye Contact Brief  Facial Expression Animated  Affect Blunted  Speech Tangential  Interaction Guarded;Minimal  Motor Activity Slow  Appearance/Hygiene Unremarkable  Behavior Characteristics Cooperative  Mood Irritable  Thought Process  Coherency Circumstantial  Content Preoccupation  Delusions None reported or observed  Perception WDL  Hallucination None reported or observed  Judgment Impaired  Confusion Mild  Danger to Self  Current suicidal ideation? Denies  Agreement Not to Harm Self Yes  Description of Agreement verbal  Danger to Others  Danger to Others None reported or observed    Estimated Sleeping Duration (Last 24 Hours): 9.50-11.50 hours

## 2024-07-27 NOTE — Plan of Care (Signed)
  Problem: Activity: Goal: Interest or engagement in activities will improve Outcome: Progressing   Problem: Coping: Goal: Ability to demonstrate self-control will improve Outcome: Progressing   Problem: Safety: Goal: Periods of time without injury will increase Outcome: Progressing   Problem: Health Behavior/Discharge Planning: Goal: Compliance with treatment plan for underlying cause of condition will improve Outcome: Not Progressing

## 2024-07-27 NOTE — Plan of Care (Signed)
  Problem: Activity: Goal: Interest or engagement in activities will improve Outcome: Progressing   Problem: Coping: Goal: Ability to verbalize frustrations and anger appropriately will improve Outcome: Progressing   Problem: Activity: Goal: Sleeping patterns will improve Outcome: Progressing

## 2024-07-27 NOTE — Group Note (Signed)
 Date:  07/27/2024 Time:  9:56 PM  Group Topic/Focus:  Wrap-Up Group:   The focus of this group is to help patients review their daily goal of treatment and discuss progress on daily workbooks.    Participation Level:  Did Not Attend  Participation Quality:     Affect:     Cognitive:     Insight: None  Engagement in Group:  None  Modes of Intervention:     Additional Comments:    [name] 07/27/2024, 9:56 PM

## 2024-07-27 NOTE — Group Note (Signed)
 Recreation Therapy Group Note   Group Topic:Other  Group Date: 07/27/2024 Start Time: 1400 End Time: 1445 Facilitators: Celestia Jeoffrey BRAVO, LRT, CTRS Location: Dayroom  Activity Description/Intervention: Therapeutic Drumming. Patients with peers and staff were given the opportunity to engage in a leader facilitated [employer] Group Empowerment Drumming Circle with staff from the [employer], in partnership with [employer]. Teaching laboratory technician and trained [employer], Norleen Mon leading with LRT observing and documenting intervention and pt response. This evidenced-based practice targets 7 areas of health and wellbeing in the human experience including: stress-reduction, exercise, self-expression, camaraderie/support, nurturing, spirituality, and music-making (leisure).    Goal Area(s) Addresses:  Patient will engage in pro-social way in music group.  Patient will follow directions of drum leader on the first prompt. Patient will demonstrate no behavioral issues during group.  Patient will identify if a reduction in stress level occurs as a result of participation in therapeutic drum circle.   Affect/Mood: Appropriate   Participation Level: Minimal    Clinical Observations/Individualized Feedback: Jolly came late to group. Pt was present for the last 10 minutes.   Plan: Continue to engage patient in RT group sessions 2-3x/week.   Jeoffrey BRAVO Celestia, LRT, CTRS 07/27/2024 4:56 PM

## 2024-07-27 NOTE — Group Note (Signed)
 Date:  07/27/2024 Time:  6:19 PM  Group Topic/Focus:   Christmas coloring  Coloring Relieves Stress Coloring is a stimulating activity that provides many health benefits similar to meditation. It relaxes the mind and focuses attention on the present moment, which may help get rid of negative thoughts and ease the stress of daily life Coloring improves fine motor skills and hand-eye coordination, which are crucial for maintaining dexterity in older adults. Engaging in creative activities like coloring stimulates cognitive function, aiding in memory retention and problem-solving skills.  Participation Level:  None  Participation Quality:  Drowsy  Affect:  Flat  Cognitive:  Lacking  Insight: None  Engagement in Group:  None  Modes of Intervention:  Activity  Additional Comments: He slept during group   Chisum Habenicht L Jester Klingberg 07/27/2024, 6:19 PM

## 2024-07-27 NOTE — Progress Notes (Signed)
 Behavior:  Irritable and argumentative with medications and VS.     Psych assessment: Denies SI/HI and AVH.    Interaction / Group attendance:  Present in the milieu.  Attends groups.  Appropriate interaction with peers.  Assertive, needy interaction with staff.   Medication/ PRNs: Compliant with encouragement. LAI given.   Pain: Denies  15 min checks in place for safety.    Dressing changed earlier this morning by previous nurse.

## 2024-07-27 NOTE — Progress Notes (Signed)
 [facility] MD Progress Note  07/27/2024 9:04 PM Miguel [name]  MRN:  [id number]  Miguel [name]. Seydel is a 73 year old male with a past psychiatric history significant for schizophrenia who presents under involuntary commitment initiated by family due to medication non-adherence, increasing paranoia, and behavioral disorganization. 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 Brother reports multiple episodes of patient whispering through the night, stating people were talking about him and planning to harm him. Patient was advised by his ACT Team to come to the ED; he refused, prompting the family to pursue IVC.Patient is admitted to [facility] unit with Q15 min safety monitoring. Multidisciplinary team approach is offered. Medication management; group/milieu therapy is offered.  Subjective:  Chart reviewed, case discussed in multidisciplinary meeting, patient seen during rounds.   Patient is noted to be walking in the hallways.  His room has been locked out so that he can attend the groups and be more visible on the unit during the mealtime.  Patient remains fixated on going back into his room.  Patient was agreeable to take his Abilify  maintainer today.  His expectation is reportedly not to take any pills after that.  Patient was educated the need to take his medications for heart and blood pressure.  He is not endorsing SI/HI/plan and denies auditory/visual hallucinations. Past Psychiatric History: see h&P Family History:  Family History  Family history unknown: Yes   Social History:   Social History   Substance and Sexual Activity  Alcohol Use No     Social History   Substance and Sexual Activity  Drug Use No    Social History   Socioeconomic History   Marital status: Divorced    Spouse name: Not on file   Number of children: Not on file   Years of education: Not on file   Highest education level: Not on file  Occupational History   Not on file  Tobacco Use   Smoking status: Every Day    Current packs/day: 0.50    Average packs/day: 0.5 packs/day for 45.9 years (23.0 ttl pk-yrs)    Types: Cigarettes    Start date: 61   Smokeless tobacco: Never  Vaping Use   Vaping status: Never Used  Substance and Sexual Activity   Alcohol use: No   Drug use: No   Sexual activity: Not on file  Other Topics Concern   Not on file  Social History Narrative   Not on file   Social Drivers of Health   Financial Resource Strain: Not on file  Food Insecurity: Patient Unable To Answer (07/18/2024)   Hunger Vital Sign    Worried About Running Out of Food in the Last Year: Patient unable to answer    Ran Out of Food in the Last Year: Patient unable to answer  Transportation Needs: Unmet Transportation Needs (07/18/2024)   PRAPARE - Administrator, Civil Service (Medical): Yes    Lack of Transportation (Non-Medical): Yes  Physical Activity: Not on file  Stress: Not on file  Social Connections: Patient Unable To Answer (07/18/2024)   Social Connection and Isolation Panel    Frequency of Communication with Friends and Family: Patient unable to answer  Frequency of Social Gatherings with Friends and Family: Patient unable to answer    Attends Religious Services: Patient unable to answer    Active Member of Clubs or Organizations: Patient unable to answer    Attends Banker Meetings: Patient unable to answer    Marital Status: Patient unable to answer   Past Medical History:  Past Medical History:  Diagnosis Date   Biventricular failure  (HCC)    CVA (cerebral vascular accident) (HCC)    Polysubstance use disorder    Schizophrenia (HCC)     Past Surgical History:  Procedure Laterality Date   APPENDECTOMY     TRACHEOSTOMY TUBE PLACEMENT N/A 03/04/2024   Procedure: CREATION, TRACHEOSTOMY;  Surgeon: [name] Massie RAMAN, MD;  Location: [facility] ORS;  Service: ENT;  Laterality: N/A;    Current Medications: Current Facility-Administered Medications  Medication Dose Route Frequency Provider Last Rate Last Admin   acetaminophen  (TYLENOL ) tablet 650 mg  650 mg Oral Q6H PRN Hampton, Tracie B, NP   650 mg at 07/26/24 0728   alum & mag hydroxide-simeth (MAALOX/MYLANTA) 200-200-20 MG/5ML suspension 30 mL  30 mL Oral Q4H PRN Hampton, Tracie B, NP   30 mL at 07/22/24 1639   amiodarone  (PACERONE ) tablet 400 mg  400 mg Oral Daily [name], [name], MD   400 mg at 07/27/24 9147   ARIPiprazole  (ABILIFY ) tablet 30 mg  30 mg Oral Daily Acsa Estey, MD   30 mg at 07/27/24 0853   ARIPiprazole  ER (ABILIFY  MAINTENA) injection 400 mg  400 mg Intramuscular Q28 days Davinity Fanara, MD   400 mg at 07/27/24 9070   ascorbic acid  (VITAMIN C ) tablet 500 mg  500 mg Oral BID [name], [name], MD   500 mg at 07/27/24 0852   feeding supplement (ENSURE PLUS HIGH PROTEIN) liquid 237 mL  237 mL Oral TID BM [name], [name], MD   237 mL at 07/27/24 2023   hydrOXYzine  (ATARAX ) tablet 25 mg  25 mg Oral TID PRN Hampton, Tracie B, NP   25 mg at 07/26/24 2119   magnesium  hydroxide (MILK OF MAGNESIA) suspension 30 mL  30 mL Oral Daily PRN Hampton, Tracie B, NP       metoprolol  tartrate (LOPRESSOR ) tablet 25 mg  25 mg Oral BID [name], [name], MD   25 mg at 07/27/24 9147   OLANZapine  (ZYPREXA ) injection 5 mg  5 mg Intramuscular TID PRN Hampton, Tracie B, NP       OLANZapine  zydis (ZYPREXA ) disintegrating tablet 5 mg  5 mg Oral TID PRN Hampton, Tracie B, NP       oxyCODONE  (Oxy IR/ROXICODONE ) immediate release tablet 5 mg  5 mg Oral Q4H PRN Dorinda Homans T, MD   5 mg at 07/26/24  2118   pantoprazole  (PROTONIX ) EC tablet 40 mg  40 mg Oral Daily [name], [name], MD   40 mg at 07/27/24 0855   traZODone  (DESYREL ) tablet 50 mg  50 mg Oral QHS PRN Hampton, Tracie B, NP   50 mg at 07/26/24 2119    Lab Results:  No results found for this or any previous visit (from the past 48 hours).   Blood Alcohol level:  Lab Results  Component Value Date   [facility] <15 07/17/2024   ETH <15 02/20/2024    Metabolic Disorder Labs: Lab Results  Component Value Date   HGBA1C 5.8 (H) 07/14/2018   MPG 119.76 07/14/2018   MPG 131.24 07/13/2018   No results found for: PROLACTIN  Lab Results  Component Value Date   CHOL 170 07/13/2018   TRIG 34 03/08/2024   HDL 41 07/13/2018   CHOLHDL 4.1 07/13/2018   VLDL 10 07/13/2018   LDLCALC 119 (H) 07/13/2018    Physical Findings: AIMS:  , ,  ,  ,    CIWA:    COWS:      Psychiatric Specialty Exam:  Presentation  General Appearance: Appropriate for Environment  Eye Contact:Fleeting  Speech:Normal Rate  Speech Volume:Decreased    Mood and Affect  Mood:Anxious  Affect:Flat   Thought Process  Thought Processes:Disorganized  Orientation:Partial  Thought Content:Illogical; Paranoid Ideation  Hallucinations:denies  Ideas of Reference:Delusions  Suicidal Thoughts:denies  Homicidal Thoughts:denies   Sensorium  Memory:Immediate Poor; Recent Poor; Remote Poor  Judgment:Impaired  Insight:None   Executive Functions  Concentration:Poor  Attention Span:Poor  Recall:Poor  Fund of Knowledge:Poor  Language:Fair   Psychomotor Activity  Psychomotor Activity:No data recorded  Musculoskeletal: Strength & Muscle Tone: within normal limits Gait & Station: normal Assets  Assets:Communication Skills; Desire for Improvement; Social Support    Physical Exam: Physical Exam Vitals and nursing note reviewed.    ROS Blood pressure (!) 120/59, pulse 74, temperature (!) 97.2 F (36.2 C), resp. rate 19, height  6' 1 (1.854 m), weight 81.2 kg, SpO2 95%. Body mass index is 23.62 kg/m.  Diagnosis: Active Problems:   Schizophrenia, paranoid (HCC)   Dementia with behavioral disturbance (HCC)   PLAN: Safety and Monitoring:  -- Involuntary admission to inpatient psychiatric unit for safety, stabilization and treatment  -- Daily contact with patient to assess and evaluate symptoms and progress in treatment  -- Patient's case to be discussed in multi-disciplinary team meeting  -- Observation Level : q15 minute checks  -- Vital signs:  q12 hours  -- Precautions: suicide, elopement, and assault -- Encouraged patient to participate in unit milieu and in scheduled group therapies  2. Psychiatric Treatment:  Scheduled Medications:  Abilify  30 mg daily- LAY Abilify  Maintena 400 mg q4 weeks given on 07/27/24   -- The risks/benefits/side-effects/alternatives to this medication were discussed in detail with the patient and time was given for questions. The patient consents to medication trial.  3. Medical Issues Being Addressed:  [facility] consulted for wound care management   Assessment and Plan:   Sacral Decubitus Ulcer Full thickness decubitus ulcer in setting of recent extended hospitalization, discharged from [facility] Nov 14th Wound looks pretty good overall WOC on board we appreciate input   Cognitive Deficit Concern for anoxic brain injury at discharge 02/2024 Delirium precautions   Peripheral Vascular Disease Legs with features c/w peripheral vascular disease, diminished DP pulses Would benefit from outpatient ABI's   HFrEF Euvolemic at this time Continue home medications   Hx Atrial Flutter Not on any anticoagulation at this time EKG with NSR Continue amiodarone   Hypertension Noted, adjust meds gradually after med rec complete     4. Discharge Planning:   -- Social work and case management to assist with discharge planning and identification of hospital follow-up needs prior to  discharge  -- Estimated LOS: 3-4 days  Allyn Foil, MD 07/27/2024, 9:04 PM

## 2024-07-28 NOTE — Group Note (Signed)
 Recreation Therapy Group Note   Group Topic:Relaxation  Group Date: 07/28/2024 Start Time: 1400 End Time: 1440 Facilitators: Celestia Jeoffrey BRAVO, LRT, CTRS Location: Dayroom  Group Description: PMR (Progressive Muscle Relaxation). LRT educates patients on what PMR is and the benefits that come from it. Patients are asked to sit with their feet flat on the floor while sitting up and all the way back in their chair, if possible. LRT and pts follow a prompt through a speaker that requires you to tense and release different muscles in their body and focus on their breathing. During session, lights are off and soft music is being played. Pts are given a stress ball to use if needed.   Goal Area(s) Addressed:  Patients will be able to describe progressive muscle relaxation.  Patient will practice using relaxation technique. Patient will identify a new coping skill.  Patient will follow multistep directions to reduce anxiety and stress.   Affect/Mood: Asleep   Participation Level: Non-verbal    Clinical Observations/Individualized Feedback: Miguel [name] was asleep duration of session.   Plan: Continue to engage patient in RT group sessions 2-3x/week.   Jeoffrey BRAVO Celestia, LRT, CTRS 07/28/2024 5:14 PM

## 2024-07-28 NOTE — Group Note (Signed)
 Date:  07/28/2024 Time:  4:22 PM  Group Topic/Focus:  Managing Feelings:   The focus of this group is to identify what feelings patients have difficulty handling and develop a plan to handle them in a healthier way upon discharge.    Participation Level:  Active  Participation Quality:  Appropriate  Affect:  Appropriate  Cognitive:  Appropriate  Insight: Appropriate  Engagement in Group:  Engaged  Modes of Intervention:  Discussion  Additional Comments:    Aya Geisel K 07/28/2024, 4:22 PM

## 2024-07-28 NOTE — Plan of Care (Signed)
   Problem: Coping: Goal: Ability to verbalize frustrations and anger appropriately will improve Outcome: Progressing

## 2024-07-28 NOTE — Group Note (Signed)
 LCSW Group Therapy Note  Group Date: 07/28/2024 Start Time: 1315 End Time: 1345   Type of Therapy and Topic:  Group Therapy: Self-Harm Alternatives  Participation Level:  Did Not Attend   Description of Group:   Patients participated in a discussion regarding non-suicidal self-injurious behavior (NSSIB, or self-harm) and the stigma surrounding it. There was also discussion surrounding how other maladaptive coping skills could be seen as self-harm, such as substance abuse. Participants were invited to share their experiences with self-harm, with emphasis being placed on the motivation for self-harm (such as release, punishment, feeling numb, etc). Patients were then asked to brainstorm potential substitutions for self-harm and were provided with a handout entitled, Distraction Techniques and Alternative Coping Strategies, published by [employer] Self-Injury Recovery.  Therapeutic Goals:  Patients will be given the opportunity to discuss NSSIB in a non-judgmental and therapeutic environment. Patients will identify which feelings lead to NSSIB.  Patients will discuss potential healthy coping skills to replace NSSIB Open discussion will specifically address stigma and shame surrounding NSSIB.   Summary of Patient Progress:  X  Therapeutic Modalities:   Cognitive Behavioral Therapy   [name], LCSWA 07/28/2024  2:21 PM

## 2024-07-28 NOTE — Plan of Care (Signed)
  Problem: Activity: Goal: Interest or engagement in activities will improve Outcome: Progressing   Problem: Activity: Goal: Interest or engagement in activities will improve Outcome: Progressing   Problem: Activity: Goal: Sleeping patterns will improve Outcome: Not Progressing

## 2024-07-28 NOTE — Progress Notes (Signed)
 [facility] MD Progress Note  07/28/2024 1:03 PM Miguel [name]  MRN:  [id number]  Miguel Incorvaia. [name] is a 73 year old male with a past psychiatric history significant for schizophrenia who presents under involuntary commitment initiated by family due to medication non-adherence, increasing paranoia, and behavioral disorganization. 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 Brother reports multiple episodes of patient whispering through the night, stating people were talking about him and planning to harm him. Patient was advised by his ACT Team to come to the ED; he refused, prompting the family to pursue IVC.Patient is admitted to [facility] unit with Q15 min safety monitoring. Multidisciplinary team approach is offered. Medication management; group/milieu therapy is offered.  Subjective:  Chart reviewed, case discussed in multidisciplinary meeting, patient seen during rounds.  Patient is noted to be walking in the hallway.  He offers no complaints.  He reports feeling tired after breakfast.  He is tolerating the Abilify  Maintena long-acting injectable very well.  He continues to display lack of insight into his medical problems and continues to refuse medications every morning.  Patient needs a lot of prompting from the nursing and encouragement to take his medications for his cardiac health.  Provider assured patient to simplify his medications but encouraged him to continue to take his heart medications.  Patient is not endorsing SI/HI/plan. Past Psychiatric History: see h&P Family History:   Family History  Family history unknown: Yes   Social History:  Social History   Substance and Sexual Activity  Alcohol Use No     Social History   Substance and Sexual Activity  Drug Use No    Social History   Socioeconomic History   Marital status: Divorced    Spouse name: Not on file   Number of children: Not on file   Years of education: Not on file   Highest education level: Not on file  Occupational History   Not on file  Tobacco Use   Smoking status: Every Day    Current packs/day: 0.50    Average packs/day: 0.5 packs/day for 45.9 years (23.0 ttl pk-yrs)    Types: Cigarettes    Start date: 35   Smokeless tobacco: Never  Vaping Use   Vaping status: Never Used  Substance and Sexual Activity   Alcohol use: No   Drug use: No   Sexual activity: Not on file  Other Topics Concern   Not on file  Social History Narrative   Not on file   Social Drivers of Health   Financial Resource Strain: Not on file  Food Insecurity: Patient Unable To Answer (07/18/2024)   Hunger Vital Sign    Worried About Running Out of Food in the Last Year: Patient unable to answer    Ran Out of Food in the Last Year: Patient unable to answer  Transportation Needs: Unmet Transportation Needs (07/18/2024)   PRAPARE - Administrator, Civil Service (Medical): Yes    Lack of Transportation (Non-Medical): Yes  Physical Activity: Not on file  Stress: Not on file  Social Connections: Patient Unable To Answer (07/18/2024)   Social Connection and Isolation Panel    Frequency of Communication with Friends and Family: Patient unable  to answer    Frequency of Social Gatherings with Friends and Family: Patient unable to answer    Attends Religious Services: Patient unable to answer    Active Member of Clubs or Organizations: Patient unable to answer    Attends Banker Meetings: Patient unable to answer    Marital Status: Patient unable to answer   Past Medical History:   Past Medical History:  Diagnosis Date   Biventricular failure (HCC)    CVA (cerebral vascular accident) (HCC)    Polysubstance use disorder    Schizophrenia (HCC)     Past Surgical History:  Procedure Laterality Date   APPENDECTOMY     TRACHEOSTOMY TUBE PLACEMENT N/A 03/04/2024   Procedure: CREATION, TRACHEOSTOMY;  Surgeon: [name] Massie RAMAN, MD;  Location: [facility] ORS;  Service: ENT;  Laterality: N/A;    Current Medications: Current Facility-Administered Medications  Medication Dose Route Frequency Provider Last Rate Last Admin   acetaminophen  (TYLENOL ) tablet 650 mg  650 mg Oral Q6H PRN Hampton, Tracie B, NP   650 mg at 07/26/24 0728   alum & mag hydroxide-simeth (MAALOX/MYLANTA) 200-200-20 MG/5ML suspension 30 mL  30 mL Oral Q4H PRN Hampton, Tracie B, NP   30 mL at 07/22/24 1639   amiodarone  (PACERONE ) tablet 400 mg  400 mg Oral Daily [name], [name], MD   400 mg at 07/28/24 0949   ARIPiprazole  (ABILIFY ) tablet 30 mg  30 mg Oral Daily Makyia Erxleben, MD   30 mg at 07/28/24 0956   ARIPiprazole  ER (ABILIFY  MAINTENA) injection 400 mg  400 mg Intramuscular Q28 days Johathan Province, MD   400 mg at 07/27/24 9070   ascorbic acid  (VITAMIN C ) tablet 500 mg  500 mg Oral BID [name], [name], MD   500 mg at 07/28/24 0949   feeding supplement (ENSURE PLUS HIGH PROTEIN) liquid 237 mL  237 mL Oral TID BM [name], [name], MD   237 mL at 07/28/24 0951   hydrOXYzine  (ATARAX ) tablet 25 mg  25 mg Oral TID PRN Hampton, Tracie B, NP   25 mg at 07/27/24 2120   magnesium  hydroxide (MILK OF MAGNESIA) suspension 30 mL  30 mL Oral Daily PRN Hampton, Tracie B, NP       metoprolol  tartrate (LOPRESSOR ) tablet 25 mg  25 mg Oral BID [name], [name], MD   25 mg at 07/28/24 9050   OLANZapine  (ZYPREXA ) injection 5 mg  5 mg Intramuscular TID PRN Hampton, Tracie B, NP       OLANZapine  zydis (ZYPREXA ) disintegrating tablet 5 mg  5 mg Oral TID PRN Hampton, Tracie B, NP       oxyCODONE  (Oxy IR/ROXICODONE ) immediate release  tablet 5 mg  5 mg Oral Q4H PRN Dorinda Drue DASEN, MD   5 mg at 07/28/24 0659   pantoprazole  (PROTONIX ) EC tablet 40 mg  40 mg Oral Daily [name], [name], MD   40 mg at 07/28/24 9050   traZODone  (DESYREL ) tablet 50 mg  50 mg Oral QHS PRN Hampton, Tracie B, NP   50 mg at 07/27/24 2120    Lab Results:  No results found for this or any previous visit (from the past 48 hours).   Blood Alcohol level:  Lab Results  Component Value Date   [facility] <15 07/17/2024   ETH <15 02/20/2024    Metabolic Disorder Labs: Lab Results  Component Value Date   HGBA1C 5.8 (H) 07/14/2018   MPG 119.76 07/14/2018   MPG 131.24 07/13/2018  No results found for: PROLACTIN Lab Results  Component Value Date   CHOL 170 07/13/2018   TRIG 34 03/08/2024   HDL 41 07/13/2018   CHOLHDL 4.1 07/13/2018   VLDL 10 07/13/2018   LDLCALC 119 (H) 07/13/2018    Physical Findings: AIMS:  , ,  ,  ,    CIWA:    COWS:      Psychiatric Specialty Exam:  Presentation  General Appearance: Appropriate for Environment  Eye Contact:Fleeting  Speech:Normal Rate  Speech Volume:Decreased    Mood and Affect  Mood:Anxious  Affect:Flat   Thought Process  Thought Processes:Disorganized  Orientation:Partial  Thought Content:Illogical; Paranoid Ideation  Hallucinations:denies  Ideas of Reference:Delusions  Suicidal Thoughts:denies  Homicidal Thoughts:denies   Sensorium  Memory:Immediate Poor; Recent Poor; Remote Poor  Judgment:Impaired  Insight:None   Executive Functions  Concentration:Poor  Attention Span:Poor  Recall:Poor  Fund of Knowledge:Poor  Language:Fair   Psychomotor Activity  Psychomotor Activity:No data recorded  Musculoskeletal: Strength & Muscle Tone: within normal limits Gait & Station: normal Assets  Assets:Communication Skills; Desire for Improvement; Social Support    Physical Exam: Physical Exam Vitals and nursing note reviewed.    ROS Blood pressure 125/61,  pulse 65, temperature (!) 97.2 F (36.2 C), resp. rate 19, height 6' 1 (1.854 m), weight 81.2 kg, SpO2 95%. Body mass index is 23.62 kg/m.  Diagnosis: Active Problems:   Schizophrenia, paranoid (HCC)   Dementia with behavioral disturbance (HCC)   PLAN: Safety and Monitoring:  -- Involuntary admission to inpatient psychiatric unit for safety, stabilization and treatment  -- Daily contact with patient to assess and evaluate symptoms and progress in treatment  -- Patient's case to be discussed in multi-disciplinary team meeting  -- Observation Level : q15 minute checks  -- Vital signs:  q12 hours  -- Precautions: suicide, elopement, and assault -- Encouraged patient to participate in unit milieu and in scheduled group therapies  2. Psychiatric Treatment:  Scheduled Medications:  Abilify  30 mg daily- LAY Abilify  Maintena 400 mg q4 weeks given on 07/27/24   -- The risks/benefits/side-effects/alternatives to this medication were discussed in detail with the patient and time was given for questions. The patient consents to medication trial.  3. Medical Issues Being Addressed:  [facility] consulted for wound care management   Assessment and Plan:   Sacral Decubitus Ulcer Full thickness decubitus ulcer in setting of recent extended hospitalization, discharged from [facility] Nov 14th Wound looks pretty good overall WOC on board we appreciate input   Cognitive Deficit Concern for anoxic brain injury at discharge 02/2024 Delirium precautions   Peripheral Vascular Disease Legs with features c/w peripheral vascular disease, diminished DP pulses Would benefit from outpatient ABI's   HFrEF Euvolemic at this time Continue home medications   Hx Atrial Flutter Not on any anticoagulation at this time EKG with NSR Continue amiodarone   Hypertension Noted, adjust meds gradually after med rec complete     4. Discharge Planning:   -- Social work and case management to assist with discharge  planning and identification of hospital follow-up needs prior to discharge  -- Estimated LOS: 3-4 days  Allyn Foil, MD 07/28/2024, 1:03 PM

## 2024-07-28 NOTE — Progress Notes (Signed)
 Patient allowed Shauntea, RN to clean wound and apply clean dressing.

## 2024-07-28 NOTE — Group Note (Signed)
 Date:  07/28/2024 Time:  9:25 PM  Group Topic/Focus:  Wrap-Up Group:   The focus of this group is to help patients review their daily goal of treatment and discuss progress on daily workbooks.    Participation Level:  Active  Participation Quality:  Appropriate  Affect:  Appropriate  Cognitive:  Alert  Insight: Appropriate  Engagement in Group:  Engaged  Modes of Intervention:  Discussion  Additional Comments:    Miguel [name] [name] 07/28/2024, 9:25 PM

## 2024-07-28 NOTE — Progress Notes (Signed)
 Behavior:  Irritable and argumentative with staff at times.     Psych assessment: Denies SI/HI and AVH.    Interaction / Group attendance:  Present in the milieu.  Appropriate interaction with peers.  Attends some groups.   Medication/ PRNs: Compliant, but argumentative with this RN.   Pain: Denies.  15 min checks in place for safety.    Patient refused wound care throughout shift.  Education provided on the importance of cleaning and dressing the wound to prevent infection and further skin damage.  Dr. JINNY aware.

## 2024-07-28 NOTE — Group Note (Signed)
 Date:  07/28/2024 Time:  6:18 PM  Group Topic/Focus:  Coping With Mental Health Crisis:   The purpose of this group is to help patients identify strategies for coping with mental health crisis.  Group discusses possible causes of crisis and ways to manage them effectively.    Participation Level:  Active  Participation Quality:  Appropriate  Affect:  Appropriate  Cognitive:  Appropriate  Insight: Appropriate  Engagement in Group:  Engaged  Modes of Intervention:  Activity  Additional Comments:    Roiza Wiedel 07/28/2024, 6:18 PM

## 2024-07-28 NOTE — Progress Notes (Signed)
 Patient was irritable and argumentative with demands; sleep walking to the nurses station on multiple counts. Denies SI, HI, AVH , anxiety & depression.   07/27/24 2100  Psych Admission Type (Psych Patients Only)  Admission Status Involuntary  Psychosocial Assessment  Patient Complaints Irritability  Eye Contact Brief  Facial Expression Animated  Affect Irritable  Speech Tangential  Interaction Needy;Assertive  Motor Activity Slow  Appearance/Hygiene In scrubs  Behavior Characteristics Cooperative  Mood Irritable  Thought Process  Coherency Tangential  Content Preoccupation  Delusions None reported or observed  Perception WDL  Hallucination None reported or observed  Judgment Impaired  Confusion Mild  Danger to Self  Current suicidal ideation? Denies  Agreement Not to Harm Self Yes  Description of Agreement verbal  Danger to Others  Danger to Others None reported or observed

## 2024-07-29 MED ORDER — BENZTROPINE MESYLATE 1 MG PO TABS
1.0000 mg | ORAL_TABLET | Freq: Once | ORAL | Status: AC
  Administered 2024-07-29: 1 mg via ORAL
  Filled 2024-07-29: qty 1

## 2024-07-29 NOTE — Progress Notes (Signed)
 Patient is an involuntary admission to [facility] for schizophrenia awaiting placement.  Patient denies SI, HI, AVH, anxiety and depression.  Patient is tangental and hard to understand at times.  Today he asked for pain medicine for his stage 3 decubitus ulcer on his buttocks.  He still refuses to use the chair pad that was order for the patient.  Also refuses his dressing changes at times but today he requested it and allowed this nurse to cleanse, and place the packing and dressing.  Will continue to monitor.

## 2024-07-29 NOTE — Plan of Care (Signed)
  Problem: Education: Goal: Knowledge of [place] [facility] Education information/materials will improve Outcome: Progressing   Problem: Activity: Goal: Interest or engagement in activities will improve Outcome: Progressing   Problem: Coping: Goal: Ability to verbalize frustrations and anger appropriately will improve Outcome: Progressing   Problem: Education: Goal: Knowledge of  General Education information/materials will improve Outcome: Progressing Goal: Emotional status will improve Outcome: Progressing Goal: Mental status will improve Outcome: Progressing Goal: Verbalization of understanding the information provided will improve Outcome: Progressing   Problem: Activity: Goal: Interest or engagement in activities will improve Outcome: Progressing Goal: Sleeping patterns will improve Outcome: Progressing   Problem: Coping: Goal: Ability to verbalize frustrations and anger appropriately will improve Outcome: Progressing Goal: Ability to demonstrate self-control will improve Outcome: Progressing   Problem: Health Behavior/Discharge Planning: Goal: Identification of resources available to assist in meeting health care needs will improve Outcome: Progressing Goal: Compliance with treatment plan for underlying cause of condition will improve Outcome: Progressing   Problem: Physical Regulation: Goal: Ability to maintain clinical measurements within normal limits will improve Outcome: Progressing   Problem: Safety: Goal: Periods of time without injury will increase Outcome: Progressing   Problem: Coping: Goal: Ability to adjust to condition or change in health will improve Outcome: Progressing   Problem: Health Behavior/Discharge Planning: Goal: Ability to identify and utilize available resources and services will improve Outcome: Progressing

## 2024-07-29 NOTE — Group Note (Signed)
 Date:  07/29/2024 Time:  8:44 PM  Group Topic/Focus:  Wrap-Up Group:   The focus of this group is to help patients review their daily goal of treatment and discuss progress on daily workbooks.    Participation Level:  Did Not Attend  Participation Quality:     Affect:     Cognitive:     Insight: None  Engagement in Group:  None  Modes of Intervention:     Additional Comments:    [name] 07/29/2024, 8:44 PM

## 2024-07-29 NOTE — BHH Counselor (Signed)
 CSW contacted [facility] [phone], ext [phone])  CSW unable to reach due to busy phone line, left HIPAA compliant VM requesting return call.   Lum Croft, MSW, [place] 07/29/2024 1:45 PM

## 2024-07-29 NOTE — Group Note (Signed)
 Date:  07/29/2024 Time:  3:30 PM  Group Topic/Focus:  Movement therapy    Participation Level:  Did Not Attend    [name] 07/29/2024, 3:30 PM

## 2024-07-29 NOTE — Group Note (Signed)
 Date:  07/29/2024 Time:  11:26 AM  Group Topic/Focus:  Personal Choices and Values:   The focus of this group is to help patients assess and explore the importance of values in their lives, how their values affect their decisions, how they express their values and what opposes their expression.    Participation Level:  Active  Participation Quality:  Appropriate  Affect:  Appropriate  Cognitive:  Appropriate  Insight: Appropriate  Engagement in Group:  None  Modes of Intervention:  Discussion  Additional Comments:  N/A  [name] 07/29/2024, 11:26 AM

## 2024-07-29 NOTE — Progress Notes (Signed)
 07/29/2024 Letter to the Court Reg: Miguel [name]. [name] DOB: July 08, 1951 [name] has been hospitalized since at [facility]) inpatient psychiatric unit.  Patient is currently being treated for Schizophrenia and Major Neurocognitive disorder (Dementia) of rapid decline in functioning. Patient is oriented to only self and remains very confused about daily activities, patient needs complete help with her ADLs like bathing, dressing, medications etc., Patient is unable to care for self. APS is fling for guardianship. As this process is time consuming patient has no safe place to go with out supervision.  We appreciate the court's assistance in stabilizing and currently we request another 30 days of inpatient care to further stabilize.  Sincerely, Davien Malone.MD [facility]

## 2024-07-29 NOTE — Progress Notes (Signed)
   07/28/24 2100  Psych Admission Type (Psych Patients Only)  Admission Status Involuntary  Psychosocial Assessment  Patient Complaints Irritability  Eye Contact Brief  Facial Expression Animated  Affect Preoccupied  Speech Tangential  Interaction Assertive;Needy  Motor Activity Slow  Appearance/Hygiene In scrubs  Behavior Characteristics Appropriate to situation;Cooperative  Mood Preoccupied  Thought Ship Broker  Content Preoccupation;Paranoia  Delusions Paranoid  Perception WDL  Hallucination None reported or observed  Judgment Impaired  Confusion Mild  Danger to Self  Current suicidal ideation? Denies  Agreement Not to Harm Self Yes  Description of Agreement verbal  Danger to Others  Danger to Others None reported or observed   Mood/Behavior:  Pleasant and cooperative. Intermittently confused. Pt appears preoccupied and paranoid. Pt noted to ask multiple questions during pt care regarding why it was being done.   Psych assessment: Denies SI/HI and AVH.     Interaction / Group attendance:  Present in the milieu. Minimal interaction with peers and staff.  Attended group.   Medication/ PRNs: Compliant with scheduled medications. Required PRNs Trazodone  for sleep and noted decrease effect. Pt required prn oxycodone  for leg pain and noted to have decrease effect. Pt required prn Atarax  for anxiety and noted to be effective.    Pain: Leg  15 min checks in place for safety.

## 2024-07-29 NOTE — Progress Notes (Signed)
 [facility] MD Progress Note  07/29/2024 4:01 PM Miguel [name]  MRN:  [id number]  Miguel [name] is a 73 year old male with a past psychiatric history significant for schizophrenia who presents under involuntary commitment initiated by family due to medication non-adherence, increasing paranoia, and behavioral disorganization. 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 Brother reports multiple episodes of patient whispering through the night, stating people were talking about him and planning to harm him. Patient was advised by his ACT Team to come to the ED; he refused, prompting the family to pursue IVC.Patient is admitted to [facility] unit with Q15 min safety monitoring. Multidisciplinary team approach is offered. Medication management; group/milieu therapy is offered.  Subjective:  Chart reviewed, case discussed in multidisciplinary meeting, patient seen during rounds.   Patient is noted to be sitting in the day area.  He requested to talk to the provider in his room.  Patient remains fixated on somebody coming to clean up the wound on his back.  He reports that he trust this provider as a psychiatrist and reports not feeling comfortable with other people.  Patient was encouraged to allow wound care of her sacral wound.  He continues to lack insight into his medical problems and the need for treatment.  He is taking his medications.  He complains of stiffness behind his legs.  Provider educated patient to have 1 dose of Cogentin as Abilify  pill will be discontinued today.  Patient is  agreeable with the plan Past Psychiatric History: see h&P Family History:  Family History  Family history unknown: Yes   Social History:  Social History   Substance and Sexual Activity  Alcohol Use No     Social History   Substance and Sexual Activity  Drug Use No    Social History   Socioeconomic History   Marital status: Divorced    Spouse name: Not on file   Number of children: Not on file   Years of education: Not on file   Highest education level: Not on file  Occupational History   Not on file  Tobacco Use   Smoking status: Every Day    Current packs/day: 0.50    Average packs/day: 0.5 packs/day for 45.9 years (23.0 ttl pk-yrs)    Types: Cigarettes    Start date: 85   Smokeless tobacco: Never  Vaping Use   Vaping status: Never Used  Substance and Sexual Activity   Alcohol use: No   Drug use: No   Sexual activity: Not on file  Other Topics Concern   Not on file  Social History Narrative   Not on file   Social Drivers of Health   Financial Resource Strain: Not on file  Food Insecurity: Patient Unable To Answer (07/18/2024)   Hunger Vital Sign    Worried About Running Out of Food in the Last Year: Patient unable to answer    Ran Out of Food in the Last Year: Patient unable to answer  Transportation Needs: Unmet Transportation Needs (07/18/2024)   PRAPARE - Administrator, Civil Service (Medical): Yes    Lack of Transportation (Non-Medical): Yes  Physical Activity: Not on file  Stress: Not on file  Social Connections: Patient Unable To Answer (07/18/2024)   Social Connection and Isolation Panel    Frequency of Communication with Friends and Family: Patient unable to answer    Frequency of Social Gatherings with Friends and Family: Patient unable to answer    Attends Religious Services: Patient unable to answer    Active Member of Clubs or Organizations: Patient unable to answer    Attends Banker Meetings: Patient unable to  answer    Marital Status: Patient unable to answer   Past Medical History:  Past Medical History:  Diagnosis Date   Biventricular failure (HCC)    CVA (cerebral vascular accident) (HCC)    Polysubstance use disorder    Schizophrenia (HCC)     Past Surgical History:  Procedure Laterality Date   APPENDECTOMY     TRACHEOSTOMY TUBE PLACEMENT N/A 03/04/2024   Procedure: CREATION, TRACHEOSTOMY;  Surgeon: [name] Massie RAMAN, MD;  Location: [facility] ORS;  Service: ENT;  Laterality: N/A;    Current Medications: Current Facility-Administered Medications  Medication Dose Route Frequency Provider Last Rate Last Admin   acetaminophen  (TYLENOL ) tablet 650 mg  650 mg Oral Q6H PRN Hampton, Tracie B, NP   650 mg at 07/26/24 0728   alum & mag hydroxide-simeth (MAALOX/MYLANTA) 200-200-20 MG/5ML suspension 30 mL  30 mL Oral Q4H PRN Hampton, Tracie B, NP   30 mL at 07/22/24 1639   amiodarone  (PACERONE ) tablet 400 mg  400 mg Oral Daily [name], [name], MD   400 mg at 07/29/24 0913   ARIPiprazole  ER (ABILIFY  MAINTENA) injection 400 mg  400 mg Intramuscular Q28 days Crystol Walpole, MD   400 mg at 07/27/24 0929   feeding supplement (ENSURE PLUS HIGH PROTEIN) liquid 237 mL  237 mL Oral TID BM [name], [name], MD   237 mL at 07/29/24 1347   hydrOXYzine  (ATARAX ) tablet 25 mg  25 mg Oral TID PRN Hampton, Tracie B, NP   25 mg at 07/29/24 0353   magnesium  hydroxide (MILK OF MAGNESIA) suspension 30 mL  30 mL Oral Daily PRN Hampton, Tracie B, NP       metoprolol  tartrate (LOPRESSOR ) tablet 25 mg  25 mg Oral BID [name], [name], MD   25 mg at 07/29/24 9088   OLANZapine  (ZYPREXA ) injection 5 mg  5 mg Intramuscular TID PRN Hampton, Tracie B, NP       OLANZapine  zydis (ZYPREXA ) disintegrating tablet 5 mg  5 mg Oral TID PRN Hampton, Tracie B, NP       oxyCODONE  (Oxy IR/ROXICODONE ) immediate release tablet 5 mg  5 mg Oral Q4H PRN Dorinda Drue DASEN, MD   5 mg at 07/29/24 0911   pantoprazole  (PROTONIX ) EC tablet 40 mg  40 mg Oral Daily  [name], [name], MD   40 mg at 07/29/24 0913   traZODone  (DESYREL ) tablet 50 mg  50 mg Oral QHS PRN Hampton, Tracie B, NP   50 mg at 07/28/24 2116    Lab Results:  No results found for this or any previous visit (from the past 48 hours).   Blood Alcohol level:  Lab Results  Component Value Date   [facility] <15 07/17/2024   ETH <15 02/20/2024    Metabolic Disorder Labs: Lab Results  Component Value Date   HGBA1C 5.8 (H) 07/14/2018   MPG 119.76 07/14/2018   MPG 131.24 07/13/2018   No results found for: PROLACTIN Lab Results  Component Value Date   CHOL 170 07/13/2018   TRIG 34 03/08/2024  HDL 41 07/13/2018   CHOLHDL 4.1 07/13/2018   VLDL 10 07/13/2018   LDLCALC 119 (H) 07/13/2018    Physical Findings: AIMS:  , ,  ,  ,    CIWA:    COWS:      Psychiatric Specialty Exam:  Presentation  General Appearance: Appropriate for Environment  Eye Contact:Fleeting  Speech:Normal Rate  Speech Volume:Decreased    Mood and Affect  Mood:Anxious  Affect:Flat   Thought Process  Thought Processes:circumstantial Orientation:Partial  Thought Content:discharge focused  Hallucinations:denies  Ideas of Reference:Delusions At baseline Suicidal Thoughts:denies  Homicidal Thoughts:denies   Sensorium  Memory:Immediate Poor; Recent Poor; Remote Poor  Judgment:Impaired  Insight:None   Executive Functions  Concentration:Poor  Attention Span:Poor  Recall:Poor  Fund of Knowledge:Poor  Language:Fair   Psychomotor Activity  Psychomotor Activity:No data recorded  Musculoskeletal: Strength & Muscle Tone: within normal limits Gait & Station: normal Assets  Assets:Communication Skills; Desire for Improvement; Social Support    Physical Exam: Physical Exam Vitals and nursing note reviewed.    ROS Blood pressure 117/61, pulse 71, temperature (!) 97.2 F (36.2 C), resp. rate 19, height 6' 1 (1.854 m), weight 81.2 kg, SpO2 95%. Body mass index is 23.62  kg/m.  Diagnosis: Active Problems:   Schizophrenia, paranoid (HCC)   Dementia with behavioral disturbance (HCC)   PLAN: Safety and Monitoring:  -- Involuntary admission to inpatient psychiatric unit for safety, stabilization and treatment  -- Daily contact with patient to assess and evaluate symptoms and progress in treatment  -- Patient's case to be discussed in multi-disciplinary team meeting  -- Observation Level : q15 minute checks  -- Vital signs:  q12 hours  -- Precautions: suicide, elopement, and assault -- Encouraged patient to participate in unit milieu and in scheduled group therapies  2. Psychiatric Treatment:  Scheduled Medications:  Discontinue Abilify  30 mg daily- LAY Abilify  Maintena 400 mg q4 weeks given on 07/27/24  Cogentin 1mg  one dose given for EPS -- The risks/benefits/side-effects/alternatives to this medication were discussed in detail with the patient and time was given for questions. The patient consents to medication trial.  3. Medical Issues Being Addressed:  [facility] consulted for wound care management   Assessment and Plan:   Sacral Decubitus Ulcer Full thickness decubitus ulcer in setting of recent extended hospitalization, discharged from [facility] 14th Wound looks pretty good overall WOC on board we appreciate input   Cognitive Deficit Concern for anoxic brain injury at discharge 02/2024 Delirium precautions   Peripheral Vascular Disease Legs with features c/w peripheral vascular disease, diminished DP pulses Would benefit from outpatient ABI's   HFrEF Euvolemic at this time Continue home medications   Hx Atrial Flutter Not on any anticoagulation at this time EKG with NSR Continue amiodarone   Hypertension Noted, adjust meds gradually after med rec complete     4. Discharge Planning:   -- Social work and case management to assist with discharge planning and identification of hospital follow-up needs prior to discharge  -- Estimated LOS:  3-4 days  Magdala Brahmbhatt, MD 07/29/2024, 4:01 PM

## 2024-07-30 NOTE — Group Note (Signed)
 LCSW Group Therapy Note  Group Date: 07/30/2024 Start Time: 1320 End Time: 1445   Type of Therapy and Topic:  Group Therapy - Healthy vs Unhealthy Coping Skills  Participation Level:  Did Not Attend   Description of Group The focus of this group was to determine what unhealthy coping techniques typically are used by group members and what healthy coping techniques would be helpful in coping with various problems. Patients were guided in becoming aware of the differences between healthy and unhealthy coping techniques. Patients were asked to identify 2-3 healthy coping skills they would like to learn to use more effectively.  Therapeutic Goals Patients learned that coping is what human beings do all day long to deal with various situations in their lives Patients defined and discussed healthy vs unhealthy coping techniques Patients identified their preferred coping techniques and identified whether these were healthy or unhealthy Patients determined 2-3 healthy coping skills they would like to become more familiar with and use more often. Patients provided support and ideas to each other   Summary of Patient Progress: X   Therapeutic Modalities Cognitive Behavioral Therapy Motivational Interviewing  [name], [place] 07/30/2024  2:49 PM

## 2024-07-30 NOTE — Progress Notes (Signed)
   07/30/24 0031  Psych Admission Type (Psych Patients Only)  Admission Status Involuntary  Psychosocial Assessment  Patient Complaints None  Eye Contact Brief  Facial Expression Animated  Affect Preoccupied  Speech Tangential  Interaction Assertive  Motor Activity Slow  Appearance/Hygiene In scrubs  Behavior Characteristics Cooperative  Mood Irritable  Thought Process  Coherency Tangential  Content Preoccupation  Delusions Paranoid  Perception WDL  Hallucination None reported or observed  Judgment Impaired  Confusion Mild  Danger to Self  Current suicidal ideation? Denies  Agreement Not to Harm Self Yes  Description of Agreement verbal  Danger to Others  Danger to Others None reported or observed

## 2024-07-30 NOTE — Plan of Care (Signed)
  Problem: Coping: Goal: Ability to verbalize frustrations and anger appropriately will improve Outcome: Progressing   Problem: Education: Goal: Emotional status will improve Outcome: Progressing   Problem: Activity: Goal: Sleeping patterns will improve Outcome: Progressing

## 2024-07-30 NOTE — Group Note (Signed)
 Recreation Therapy Group Note   Group Topic:Communication  Group Date: 07/30/2024 Start Time: 1400 End Time: 1450 Facilitators: Celestia Jeoffrey BRAVO, LRT, CTRS Location: Dayroom  Group Description: Reminisce Conversation Cards. Patients drew a laminated card out of a bag that had a word or phrase on it. Pt encouraged to speak about a time in their life or fond memory that specifically relates to the word they chose out of the bag. An example would be: "parenthood, meals, siblings, travel, or home".  LRT prompted following questions and encouraged contribution from peers to increase communication.   Goal Area(s) Addressed: Patient will increase verbal communication by conversing with peers. Patient will contribute to group discussion with minimal prompting. Patient will reminisce a positive memory or moment in their life.   Affect/Mood: Flat   Participation Level: Minimal    Clinical Observations/Individualized Feedback: Gavyn came late to group. Pt was unable to have a linear conversation.   Plan: Continue to engage patient in RT group sessions 2-3x/week.   Jeoffrey BRAVO Celestia, LRT, CTRS 07/30/2024 4:54 PM

## 2024-07-30 NOTE — Group Note (Signed)
 Date:  07/30/2024 Time:  10:49 AM  Group Topic/Focus:  Managing Feelings:   The focus of this group is to identify what feelings patients have difficulty handling and develop a plan to handle them in a healthier way upon discharge.    Participation Level:  Did Not Attend  [name] 07/30/2024, 10:49 AM

## 2024-07-30 NOTE — Group Note (Signed)
 Date:  07/30/2024 Time:  8:46 PM  Group Topic/Focus:  Wrap-Up Group:   The focus of this group is to help patients review their daily goal of treatment and discuss progress on daily workbooks.    Participation Level:  Active  Participation Quality:  Appropriate  Affect:  Appropriate  Cognitive:  Appropriate  Insight: Appropriate  Engagement in Group:  Engaged  Modes of Intervention:  Activity and Discussion  Additional Comments:    Miguel [name] 07/30/2024, 8:46 PM

## 2024-07-30 NOTE — Progress Notes (Signed)
 [facility] MD Progress Note  07/30/2024 10:52 AM Miguel [name]  MRN:  [id number]  Miguel [name] is a 73 year old male with a past psychiatric history significant for schizophrenia who presents under involuntary commitment initiated by family due to medication non-adherence, increasing paranoia, and behavioral disorganization. 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 Brother reports multiple episodes of patient whispering through the night, stating people were talking about him and planning to harm him. Patient was advised by his ACT Team to come to the ED; he refused, prompting the family to pursue IVC.Patient is admitted to [facility] unit with Q15 min safety monitoring. Multidisciplinary team approach is offered. Medication management; group/milieu therapy is offered.  Subjective:  Chart reviewed, case discussed in multidisciplinary meeting, patient seen during rounds.   Patient is noted to be talking to his peers in the day room.  He talks about his participation in the groups.  He informs the provider that some doctors came to check his sacral wound and clean it.  Per nursing patient is cleaning his own wound at times.  Patient continues to lack insight into his medical problems and he continues to question about the need for his medications.  He denies having leg cramps today.  He is tolerating Abilify  Maintena very well with no reported side effects so far.  He denies SI/HI/plan and denies hallucinations.  Discussed in treatment team to reach out to the family members about discharge  planning.  Capacity evaluation: Patient is unable to acknowledge and discuss his ongoing medical problems including his recent medical admission for heart attacks, hypoxic brain injury, treatment.  He is unable to appreciate and discussed the pros and cons of the need for medications and not taking medications.  Patient lacks capacity to make medical decisions at this time. Past Psychiatric History: see h&P Family History:  Family History  Family history unknown: Yes   Social History:  Social History   Substance and Sexual Activity  Alcohol Use No     Social History   Substance and Sexual Activity  Drug Use No    Social History   Socioeconomic History   Marital status: Divorced    Spouse name: Not on file   Number of children: Not on file   Years of education: Not on file   Highest education level: Not on file  Occupational History   Not on file  Tobacco Use   Smoking status: Every Day    Current packs/day: 0.50    Average packs/day: 0.5 packs/day for 45.9 years (23.0 ttl pk-yrs)    Types: Cigarettes    Start date: 41   Smokeless tobacco: Never  Vaping Use   Vaping status: Never Used  Substance and Sexual Activity   Alcohol use: No   Drug use: No   Sexual activity: Not on file  Other Topics Concern   Not on file  Social History Narrative   Not on file   Social Drivers of Health   Financial Resource Strain: Not on file  Food Insecurity: Patient Unable To Answer (07/18/2024)   Hunger Vital Sign    Worried About Running Out of Food in the Last Year: Patient unable to answer  Ran Out of Food in the Last Year: Patient unable to answer  Transportation Needs: Unmet Transportation Needs (07/18/2024)   PRAPARE - Administrator, Civil Service (Medical): Yes    Lack of Transportation (Non-Medical): Yes  Physical Activity: Not on file  Stress: Not on file  Social Connections: Patient Unable To Answer (07/18/2024)   Social Connection and Isolation Panel     Frequency of Communication with Friends and Family: Patient unable to answer    Frequency of Social Gatherings with Friends and Family: Patient unable to answer    Attends Religious Services: Patient unable to answer    Active Member of Clubs or Organizations: Patient unable to answer    Attends Banker Meetings: Patient unable to answer    Marital Status: Patient unable to answer   Past Medical History:  Past Medical History:  Diagnosis Date   Biventricular failure (HCC)    CVA (cerebral vascular accident) (HCC)    Polysubstance use disorder    Schizophrenia (HCC)     Past Surgical History:  Procedure Laterality Date   APPENDECTOMY     TRACHEOSTOMY TUBE PLACEMENT N/A 03/04/2024   Procedure: CREATION, TRACHEOSTOMY;  Surgeon: [name] Massie RAMAN, MD;  Location: [facility] ORS;  Service: ENT;  Laterality: N/A;    Current Medications: Current Facility-Administered Medications  Medication Dose Route Frequency Provider Last Rate Last Admin   acetaminophen  (TYLENOL ) tablet 650 mg  650 mg Oral Q6H PRN Hampton, Tracie B, NP   650 mg at 07/30/24 0357   alum & mag hydroxide-simeth (MAALOX/MYLANTA) 200-200-20 MG/5ML suspension 30 mL  30 mL Oral Q4H PRN Hampton, Tracie B, NP   30 mL at 07/22/24 1639   amiodarone  (PACERONE ) tablet 400 mg  400 mg Oral Daily [name], [name], MD   400 mg at 07/30/24 0932   ARIPiprazole  ER (ABILIFY  MAINTENA) injection 400 mg  400 mg Intramuscular Q28 days Skya Mccullum, MD   400 mg at 07/27/24 0929   feeding supplement (ENSURE PLUS HIGH PROTEIN) liquid 237 mL  237 mL Oral TID BM [name], [name], MD   237 mL at 07/30/24 0935   hydrOXYzine  (ATARAX ) tablet 25 mg  25 mg Oral TID PRN Hampton, Tracie B, NP   25 mg at 07/29/24 2124   magnesium  hydroxide (MILK OF MAGNESIA) suspension 30 mL  30 mL Oral Daily PRN Hampton, Tracie B, NP       metoprolol  tartrate (LOPRESSOR ) tablet 25 mg  25 mg Oral BID [name], [name], MD   25 mg at 07/29/24 2123   OLANZapine  (ZYPREXA )  injection 5 mg  5 mg Intramuscular TID PRN Hampton, Tracie B, NP       OLANZapine  zydis (ZYPREXA ) disintegrating tablet 5 mg  5 mg Oral TID PRN Hampton, Tracie B, NP       oxyCODONE  (Oxy IR/ROXICODONE ) immediate release tablet 5 mg  5 mg Oral Q4H PRN Dorinda Drue DASEN, MD   5 mg at 07/29/24 2123   pantoprazole  (PROTONIX ) EC tablet 40 mg  40 mg Oral Daily [name], [name], MD   40 mg at 07/30/24 0933   traZODone  (DESYREL ) tablet 50 mg  50 mg Oral QHS PRN Hampton, Tracie B, NP   50 mg at 07/29/24 2123    Lab Results:  No results found for this or any previous visit (from the past 48 hours).   Blood Alcohol level:  Lab Results  Component Value Date   [facility] <15 07/17/2024   ETH <15  02/20/2024    Metabolic Disorder Labs: Lab Results  Component Value Date   HGBA1C 5.8 (H) 07/14/2018   MPG 119.76 07/14/2018   MPG 131.24 07/13/2018   No results found for: PROLACTIN Lab Results  Component Value Date   CHOL 170 07/13/2018   TRIG 34 03/08/2024   HDL 41 07/13/2018   CHOLHDL 4.1 07/13/2018   VLDL 10 07/13/2018   LDLCALC 119 (H) 07/13/2018    Physical Findings: AIMS:  , ,  ,  ,    CIWA:    COWS:      Psychiatric Specialty Exam:  Presentation  General Appearance: Appropriate for Environment  Eye Contact:Fleeting  Speech:Normal Rate  Speech Volume:Decreased    Mood and Affect  Mood:Anxious  Affect:Flat   Thought Process  Thought Processes:circumstantial Orientation:Partial  Thought Content:discharge focused  Hallucinations:denies  Ideas of Reference:Delusions At baseline Suicidal Thoughts:denies  Homicidal Thoughts:denies   Sensorium  Memory:Immediate Poor; Recent Poor; Remote Poor  Judgment:Impaired  Insight:None   Executive Functions  Concentration:Poor  Attention Span:Poor  Recall:Poor  Fund of Knowledge:Poor  Language:Fair   Psychomotor Activity  Psychomotor Activity:No data recorded  Musculoskeletal: Strength & Muscle Tone: within  normal limits Gait & Station: normal Assets  Assets:Communication Skills; Desire for Improvement; Social Support    Physical Exam: Physical Exam Vitals and nursing note reviewed.    ROS Blood pressure (!) 94/57, pulse 62, temperature 98.4 F (36.9 C), resp. rate 16, height 6' 1 (1.854 m), weight 81.2 kg, SpO2 97%. Body mass index is 23.62 kg/m.  Diagnosis: Active Problems:   Schizophrenia, paranoid (HCC)   Dementia with behavioral disturbance (HCC)   PLAN: Safety and Monitoring:  -- Involuntary admission to inpatient psychiatric unit for safety, stabilization and treatment  -- Daily contact with patient to assess and evaluate symptoms and progress in treatment  -- Patient's case to be discussed in multi-disciplinary team meeting  -- Observation Level : q15 minute checks  -- Vital signs:  q12 hours  -- Precautions: suicide, elopement, and assault -- Encouraged patient to participate in unit milieu and in scheduled group therapies  2. Psychiatric Treatment:  Scheduled Medications:  Discontinue Abilify  30 mg daily- LAY Abilify  Maintena 400 mg q4 weeks given on 07/27/24  Cogentin  1mg  one dose given for EPS -- The risks/benefits/side-effects/alternatives to this medication were discussed in detail with the patient and time was given for questions. The patient consents to medication trial.  3. Medical Issues Being Addressed:  [facility] consulted for wound care management   Assessment and Plan:   Sacral Decubitus Ulcer Full thickness decubitus ulcer in setting of recent extended hospitalization, discharged from [facility] Nov 14th Wound looks pretty good overall WOC on board we appreciate input   Cognitive Deficit Concern for anoxic brain injury at discharge 02/2024 Delirium precautions   Peripheral Vascular Disease Legs with features c/w peripheral vascular disease, diminished DP pulses Would benefit from outpatient ABI's   HFrEF Euvolemic at this time Continue home  medications   Hx Atrial Flutter Not on any anticoagulation at this time EKG with NSR Continue amiodarone   Hypertension Noted, adjust meds gradually after med rec complete     4. Discharge Planning:   -- Social work and case management to assist with discharge planning and identification of hospital follow-up needs prior to discharge  -- Estimated LOS: 3-4 days  Allyn Foil, MD 07/30/2024, 10:52 AM

## 2024-07-30 NOTE — [facility] Group Notes (Signed)
 Spirituality Group   Description: Participant directed exploration of values, beliefs and meaning   **Focus on locating spiritual moments, meaning making and connection.  Following a brief framework of chaplain's role and ground rules of group behavior, participants are invited to share concerns or questions that engage spiritual life. Emphasis placed on common themes and shared experiences and ways to make meaning and clarify living into one's values.   Theory/Process/Goal: Utilize the theoretical framework of group therapy established by Celena Kite, Relational Cultural Theory and Rogerian approaches to facilitate relational empathy and use of the "here and now" to foster reflection, self-awareness, and sharing.   Observations: Kashius was an active participant in the group discussion.  Chrisoula Zegarra L. Delores HERO.Div

## 2024-07-30 NOTE — Progress Notes (Signed)
   07/30/24 1700  Psych Admission Type (Psych Patients Only)  Admission Status Involuntary  Psychosocial Assessment  Patient Complaints None  Eye Contact Brief  Facial Expression Animated  Affect Preoccupied  Speech Tangential  Interaction Assertive  Motor Activity Slow  Appearance/Hygiene In scrubs  Behavior Characteristics Cooperative  Mood Irritable  Thought Process  Coherency Tangential  Content Preoccupation  Delusions Paranoid  Perception WDL  Hallucination None reported or observed  Judgment Impaired  Confusion Mild  Danger to Self  Current suicidal ideation? Denies  Agreement Not to Harm Self Yes  Description of Agreement verbal  Danger to Others  Danger to Others None reported or observed

## 2024-07-31 NOTE — Progress Notes (Signed)
   07/31/24 1500  Psych Admission Type (Psych Patients Only)  Admission Status Involuntary  Psychosocial Assessment  Patient Complaints None  Eye Contact Brief  Facial Expression Animated  Affect Preoccupied  Speech Tangential  Interaction Assertive  Motor Activity Slow  Appearance/Hygiene Unremarkable  Behavior Characteristics Cooperative  Mood Pleasant  Thought Process  Coherency Tangential  Content Preoccupation  Delusions Paranoid  Perception WDL  Hallucination None reported or observed  Judgment Impaired  Confusion Mild  Danger to Self  Current suicidal ideation? Denies  Agreement Not to Harm Self Yes  Description of Agreement verbal  Danger to Others  Danger to Others None reported or observed

## 2024-07-31 NOTE — Group Note (Signed)
 Physical/Occupational Therapy Group Note  Group Topic: Neurographic Art  Group Date: 07/31/2024 Start Time: 1300 End Time: 1400 Facilitators: Clive Warren CROME, OT   Group Description: Group participated with Neurographic art activity, using watercolor paints to facilitate creative expression and meditation/relaxation for each individual.  Incorporated bimanual coordination, mental focus, emotional processing, task/command following and relaxation techniques as appropriate.  Patients engaged socially with therapist and other group participants throughout session. Allowed to ask questions as appropriate, and encouraged to identify ways they could use/share their creations with themselves and others.  Therapeutic Goal(s):  Demonstrate ability to independently manipulate utensils required to participate with and complete activity. Demonstrate ability to cognitively focus on task and follow commands necessary for completion. Demonstrate use of art as an outlet for emotional processing and expression. Identify and demonstrate importance of relaxation, neural calming and meditation for improved participation with life groups.  Individual Participation: Pt agreeable to participate. Demonstrated difficulty with sequencing and following instructions requiring moderate to maximum multimodal cues to sequencing and initiate each step. Required visual aide to assist with general instruction but then began to fixate on trying to copy the example page despite intermittent VC to redirect. Difficulty with ending the task as well despite repeated notifications of how much time was left. Engaged minimally in group discussion with prompting.   Participation Level: Moderate and Engaged   Participation Quality: Moderate Cues and Maximum Cues   Behavior: Alert, Calm, and Distracted   Speech/Thought Process: Barely audible and Focused   Affect/Mood: Flat and Stable    Insight: Poor   Judgement: Poor   Modes of  Intervention: Activity, Clarification, Discussion, Education, Exploration, Problem-solving, Rapport Building, Socialization, and Support  Patient Response to Interventions:  Attentive and Engaged   Plan: Continue to engage patient in PT/OT groups 1 - 2x/week.  Miguel Dykman R., MPH, MS, OTR/L ascom [phone] 07/31/24, 3:04 PM

## 2024-07-31 NOTE — Plan of Care (Signed)
  Problem: Education: Goal: Knowledge of [facility] Education information/materials will improve 07/31/2024 0718 by Ezzard Stank, RN Outcome: Progressing 07/31/2024 0716 by Ezzard Stank, RN Outcome: Progressing   Problem: Activity: Goal: Interest or engagement in activities will improve 07/31/2024 0718 by Ezzard Stank, RN Outcome: Progressing 07/31/2024 0716 by Ezzard Stank, RN Outcome: Progressing   Problem: Coping: Goal: Ability to verbalize frustrations and anger appropriately will improve 07/31/2024 0718 by Ezzard Stank, RN Outcome: Progressing 07/31/2024 0716 by Ezzard Stank, RN Outcome: Progressing

## 2024-07-31 NOTE — Progress Notes (Signed)
 [facility] MD Progress Note  07/31/2024 12:35 PM Miguel [name]  MRN:  [id number]  Miguel [name] is a 73 year old male with a past psychiatric history significant for schizophrenia who presents under involuntary commitment initiated by family due to medication non-adherence, increasing paranoia, and behavioral disorganization. 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 Brother reports multiple episodes of patient whispering through the night, stating people were talking about him and planning to harm him. Patient was advised by his ACT Team to come to the ED; he refused, prompting the family to pursue IVC.Patient is admitted to [facility] unit with Q15 min safety monitoring. Multidisciplinary team approach is offered. Medication management; group/milieu therapy is offered.  Subjective:  Chart reviewed, case discussed in multidisciplinary meeting, patient seen during rounds.   Patient was seen in the milieu today, continues to be disorganized and reportedly is tolerating well his IM Abilify  he does not have much insight into his medical issues.  He lacks capacity appears confused  Capacity evaluation: Patient is unable to acknowledge and discuss his ongoing medical problems including his recent medical admission for heart attacks, hypoxic brain injury, treatment.  He is unable to appreciate and discussed the pros and cons of the need for medications and not taking medications.  Patient lacks capacity to make medical decisions at this time. Past Psychiatric History: see h&P Family History:   Family History  Family history unknown: Yes   Social History:  Social History   Substance and Sexual Activity  Alcohol Use No     Social History   Substance and Sexual Activity  Drug Use No    Social History   Socioeconomic History   Marital status: Divorced    Spouse name: Not on file   Number of children: Not on file   Years of education: Not on file   Highest education level: Not on file  Occupational History   Not on file  Tobacco Use   Smoking status: Every Day    Current packs/day: 0.50    Average packs/day: 0.5 packs/day for 45.9 years (23.0 ttl pk-yrs)    Types: Cigarettes    Start date: 50   Smokeless tobacco: Never  Vaping Use   Vaping status: Never Used  Substance and Sexual Activity   Alcohol use: No   Drug use: No   Sexual activity: Not on file  Other Topics Concern   Not on file  Social History Narrative   Not on file   Social Drivers of Health   Financial Resource Strain: Not on file  Food Insecurity: Patient Unable To Answer (07/18/2024)   Hunger Vital Sign    Worried About Running Out of Food in the Last Year: Patient unable to answer    Ran Out of Food in the Last Year: Patient unable to answer  Transportation Needs: Unmet Transportation Needs (07/18/2024)   PRAPARE - Administrator, Civil Service (Medical): Yes    Lack of Transportation (Non-Medical): Yes  Physical Activity: Not on file  Stress: Not on file  Social Connections: Patient Unable To Answer (07/18/2024)   Social Connection and Isolation Panel    Frequency of Communication with Friends and Family: Patient unable to  answer    Frequency of Social Gatherings with Friends and Family: Patient unable to answer    Attends Religious Services: Patient unable to answer    Active Member of Clubs or Organizations: Patient unable to answer    Attends Banker Meetings: Patient unable to answer    Marital Status: Patient unable to answer   Past Medical History:   Past Medical History:  Diagnosis Date   Biventricular failure (HCC)    CVA (cerebral vascular accident) (HCC)    Polysubstance use disorder    Schizophrenia (HCC)     Past Surgical History:  Procedure Laterality Date   APPENDECTOMY     TRACHEOSTOMY TUBE PLACEMENT N/A 03/04/2024   Procedure: CREATION, TRACHEOSTOMY;  Surgeon: [name] Massie RAMAN, MD;  Location: [facility] ORS;  Service: ENT;  Laterality: N/A;    Current Medications: Current Facility-Administered Medications  Medication Dose Route Frequency Provider Last Rate Last Admin   acetaminophen  (TYLENOL ) tablet 650 mg  650 mg Oral Q6H PRN Hampton, Tracie B, NP   650 mg at 07/30/24 0357   alum & mag hydroxide-simeth (MAALOX/MYLANTA) 200-200-20 MG/5ML suspension 30 mL  30 mL Oral Q4H PRN Hampton, Tracie B, NP   30 mL at 07/22/24 1639   amiodarone  (PACERONE ) tablet 400 mg  400 mg Oral Daily [name], [name], MD   400 mg at 07/31/24 0929   ARIPiprazole  ER (ABILIFY  MAINTENA) injection 400 mg  400 mg Intramuscular Q28 days [name], [name], MD   400 mg at 07/27/24 0929   feeding supplement (ENSURE PLUS HIGH PROTEIN) liquid 237 mL  237 mL Oral TID BM Ezmeralda Stefanick R, MD   237 mL at 07/31/24 0930   hydrOXYzine  (ATARAX ) tablet 25 mg  25 mg Oral TID PRN Hampton, Tracie B, NP   25 mg at 07/30/24 2101   magnesium  hydroxide (MILK OF MAGNESIA) suspension 30 mL  30 mL Oral Daily PRN Hampton, Tracie B, NP       metoprolol  tartrate (LOPRESSOR ) tablet 25 mg  25 mg Oral BID [name], [name], MD   25 mg at 07/31/24 9070   OLANZapine  (ZYPREXA ) injection 5 mg  5 mg Intramuscular TID PRN Hampton, Tracie B, NP       OLANZapine  zydis (ZYPREXA ) disintegrating tablet 5 mg  5 mg Oral TID PRN Hampton, Tracie B, NP       oxyCODONE  (Oxy IR/ROXICODONE ) immediate release tablet 5 mg  5 mg Oral Q4H PRN Dorinda Homans T, MD   5 mg at 07/30/24 2100   pantoprazole  (PROTONIX ) EC tablet 40 mg  40 mg Oral Daily [name], [name], MD   40 mg at 07/31/24 9070   traZODone  (DESYREL ) tablet 50  mg  50 mg Oral QHS PRN Hampton, Tracie B, NP   50 mg at 07/30/24 2100    Lab Results:  No results found for this or any previous visit (from the past 48 hours).   Blood Alcohol level:  Lab Results  Component Value Date   [facility] <15 07/17/2024   ETH <15 02/20/2024    Metabolic Disorder Labs: Lab Results  Component Value Date   HGBA1C 5.8 (H) 07/14/2018   MPG 119.76 07/14/2018   MPG 131.24 07/13/2018   No results found for: PROLACTIN Lab Results  Component Value Date   CHOL 170 07/13/2018   TRIG 34 03/08/2024   HDL 41 07/13/2018   CHOLHDL 4.1 07/13/2018   VLDL 10 07/13/2018   LDLCALC 119 (H) 07/13/2018    Physical Findings: AIMS:  , ,  ,  ,  CIWA:    COWS:      Psychiatric Specialty Exam:  Presentation  General Appearance: Appropriate for Environment  Eye Contact:Fleeting  Speech:Normal Rate  Speech Volume:Decreased    Mood and Affect  Mood:Anxious  Affect:Flat   Thought Process  Thought Processes:circumstantial Orientation:Partial  Thought Content:discharge focused  Hallucinations:denies  Ideas of Reference:Delusions At baseline Suicidal Thoughts:denies  Homicidal Thoughts:denies   Sensorium  Memory:Immediate Poor; Recent Poor; Remote Poor  Judgment:Impaired  Insight:None   Executive Functions  Concentration:Poor  Attention Span:Poor  Recall:Poor  Fund of Knowledge:Poor  Language:Fair   Psychomotor Activity  Psychomotor Activity:No data recorded  Musculoskeletal: Strength & Muscle Tone: within normal limits Gait & Station: normal Assets  Assets:Communication Skills; Desire for Improvement; Social Support    Physical Exam: Physical Exam Vitals and nursing note reviewed.    ROS Blood pressure 104/80, pulse (!) 56, temperature 98.2 F (36.8 C), resp. rate 20, height 6' 1 (1.854 m), weight 81.2 kg, SpO2 96%. Body mass index is 23.62 kg/m.  Diagnosis: Active Problems:   Schizophrenia, paranoid (HCC)    Dementia with behavioral disturbance (HCC)   PLAN: Safety and Monitoring:  -- Involuntary admission to inpatient psychiatric unit for safety, stabilization and treatment  -- Daily contact with patient to assess and evaluate symptoms and progress in treatment  -- Patient's case to be discussed in multi-disciplinary team meeting  -- Observation Level : q15 minute checks  -- Vital signs:  q12 hours  -- Precautions: suicide, elopement, and assault -- Encouraged patient to participate in unit milieu and in scheduled group therapies  2. Psychiatric Treatment:  Scheduled Medications:  Discontinue Abilify  30 mg daily- LAY Abilify  Maintena 400 mg q4 weeks given on 07/27/24  Cogentin  1mg  one dose given for EPS -- The risks/benefits/side-effects/alternatives to this medication were discussed in detail with the patient and time was given for questions. The patient consents to medication trial.  3. Medical Issues Being Addressed:  [facility] consulted for wound care management   Assessment and Plan:   Sacral Decubitus Ulcer Full thickness decubitus ulcer in setting of recent extended hospitalization, discharged from [facility] 14[facility] Wound looks pretty good overall WOC on board we appreciate input   Cognitive Deficit Concern for anoxic brain injury at discharge 02/2024 Delirium precautions   Peripheral Vascular Disease Legs with features c/w peripheral vascular disease, diminished DP pulses Would benefit from outpatient ABI's   HFrEF Euvolemic at this time Continue home medications   Hx Atrial Flutter Not on any anticoagulation at this time EKG with NSR Continue amiodarone   Hypertension Noted, adjust meds gradually after med rec complete     4. Discharge Planning:   -- Social work and case management to assist with discharge planning and identification of hospital follow-up needs prior to discharge  -- Estimated LOS: 3-4 days  Millie JONELLE Manners, MD 07/31/2024, 12:35 PM

## 2024-07-31 NOTE — Progress Notes (Signed)
   07/30/24 2100  Psych Admission Type (Psych Patients Only)  Admission Status Involuntary  Psychosocial Assessment  Patient Complaints None  Eye Contact Brief  Facial Expression Animated  Affect Preoccupied  Speech Tangential  Interaction Assertive  Motor Activity Slow  Appearance/Hygiene In scrubs  Behavior Characteristics Cooperative  Mood Pleasant  Thought Process  Coherency Tangential  Content Preoccupation  Delusions Paranoid  Perception WDL  Hallucination None reported or observed  Judgment Impaired  Confusion Mild  Danger to Self  Current suicidal ideation? Denies

## 2024-07-31 NOTE — Plan of Care (Signed)
   Problem: Education: Goal: Knowledge of [facility] Education information/materials will improve Outcome: Progressing   Problem: Activity: Goal: Interest or engagement in activities will improve Outcome: Progressing   Problem: Coping: Goal: Ability to verbalize frustrations and anger appropriately will improve Outcome: Progressing

## 2024-07-31 NOTE — Plan of Care (Signed)
  Problem: Education: Goal: Knowledge of [place] [facility] Education information/materials will improve Outcome: Progressing   Problem: Activity: Goal: Interest or engagement in activities will improve Outcome: Progressing   Problem: Coping: Goal: Ability to verbalize frustrations and anger appropriately will improve Outcome: Progressing   Problem: Education: Goal: Knowledge of  General Education information/materials will improve Outcome: Progressing Goal: Emotional status will improve Outcome: Progressing Goal: Mental status will improve Outcome: Progressing Goal: Verbalization of understanding the information provided will improve Outcome: Progressing   Problem: Activity: Goal: Interest or engagement in activities will improve Outcome: Progressing Goal: Sleeping patterns will improve Outcome: Progressing   Problem: Coping: Goal: Ability to verbalize frustrations and anger appropriately will improve Outcome: Progressing Goal: Ability to demonstrate self-control will improve Outcome: Progressing   Problem: Health Behavior/Discharge Planning: Goal: Identification of resources available to assist in meeting health care needs will improve Outcome: Progressing Goal: Compliance with treatment plan for underlying cause of condition will improve Outcome: Progressing   Problem: Physical Regulation: Goal: Ability to maintain clinical measurements within normal limits will improve Outcome: Progressing   Problem: Safety: Goal: Periods of time without injury will increase Outcome: Progressing   Problem: Coping: Goal: Ability to adjust to condition or change in health will improve Outcome: Progressing   Problem: Health Behavior/Discharge Planning: Goal: Ability to identify and utilize available resources and services will improve Outcome: Progressing

## 2024-07-31 NOTE — Progress Notes (Signed)
   07/31/24 2136  Psych Admission Type (Psych Patients Only)  Admission Status Involuntary  Psychosocial Assessment  Patient Complaints None  Eye Contact Brief  Facial Expression Animated  Affect Preoccupied  Speech Tangential  Interaction Assertive  Motor Activity Slow  Appearance/Hygiene Unremarkable  Behavior Characteristics Cooperative  Mood Pleasant  Thought Process  Coherency Tangential  Content Preoccupation  Delusions Paranoid  Perception WDL  Hallucination None reported or observed  Judgment Impaired  Confusion Mild  Danger to Self  Current suicidal ideation? Denies  Agreement Not to Harm Self Yes  Description of Agreement verbal  Danger to Others  Danger to Others None reported or observed

## 2024-07-31 NOTE — BHH Counselor (Signed)
  CSW contacted [facility] [place] [facility] [phone], ext [phone])     CSW submitted report to APS per providers request.   Lum Croft, MSW, [facility] 07/31/2024 10:37 AM

## 2024-07-31 NOTE — Group Note (Signed)
 Date:  07/31/2024 Time:  11:04 AM  Group Topic/Focus:  Making Healthy Choices:   The focus of this group is to help patients identify negative/unhealthy choices they were using prior to admission and identify positive/healthier coping strategies to replace them upon discharge.    Participation Level:  Active  Participation Quality:  Appropriate  Affect:  Appropriate  Cognitive:  Appropriate  Insight: Appropriate  Engagement in Group:  Engaged  Modes of Intervention:  Discussion   Arland Nutting 07/31/2024, 11:04 AM

## 2024-07-31 NOTE — Group Note (Signed)
 Recreation Therapy Group Note   Group Topic:Leisure Education  Group Date: 07/31/2024 Start Time: 1415 End Time: 1440 Facilitators: Celestia Jeoffrey BRAVO, LRT, CTRS Location: Dayroom  Group Description: Music. Patients are encouraged to name their favorite song(s) for LRT to play song through speaker for group to hear, while in the courtyard getting fresh air and sunlight. Patients educated on the definition of leisure and the importance of having different leisure interests outside of the hospital. Group discussed how leisure activities can often be used as pharmacologist and that listening to music and being outside are examples.    Goal Area(s) Addressed:  Patient will identify a current leisure interest.  Patient will practice making a positive decision. Patient will have the opportunity to try a new leisure activity.   Affect/Mood: Appropriate   Participation Level: Minimal    Clinical Observations/Individualized Feedback: Miguel [name] was present in group. Pt was unable to have a linear conversation.   Plan: Continue to engage patient in RT group sessions 2-3x/week.   Jeoffrey BRAVO Celestia, LRT, CTRS 07/31/2024 5:08 PM

## 2024-07-31 NOTE — Group Note (Signed)
 Date:  07/31/2024 Time:  4:44 PM  Group Topic/Focus:  Rediscovering Joy:   The focus of this group is to explore various ways to relieve stress in a positive manner.    Participation Level:  Active  Participation Quality:  Appropriate and Attentive  Affect:  Appropriate  Cognitive:  Alert  Insight: Appropriate  Engagement in Group:  Engaged  Modes of Intervention:  Discussion  Additional Comments:     Maglione,Griffin Gerrard E 07/31/2024, 4:44 PM

## 2024-07-31 NOTE — Group Note (Signed)
 Date:  07/31/2024 Time:  8:43 PM  Group Topic/Focus:  Goals Group:   The focus of this group is to help patients establish daily goals to achieve during treatment and discuss how the patient can incorporate goal setting into their daily lives to aide in recovery.    Participation Level:  Active  Participation Quality:  Appropriate  Affect:  Appropriate  Cognitive:  Appropriate  Insight: Good  Engagement in Group:  Engaged  Modes of Intervention:  Discussion  Additional Comments:    Miguel [name] 07/31/2024, 8:43 PM

## 2024-08-01 MED ORDER — NAPROXEN 500 MG PO TABS
250.0000 mg | ORAL_TABLET | Freq: Two times a day (BID) | ORAL | Status: AC
  Administered 2024-08-01 – 2024-10-02 (×116): 250 mg via ORAL
  Filled 2024-08-01 (×117): qty 1

## 2024-08-01 NOTE — Progress Notes (Signed)
 Patient came to nurse's station two separate times requesting his medications.  Both times pt refused to take his medications.  I don't have any heart problems.   I don't need these.   Education provided on previous heart attack and the importance of his cardiac medications to prevent further complications.  Pt not accepting.

## 2024-08-01 NOTE — Group Note (Signed)
 Date:  08/01/2024 Time:  11:14 AM  Group Topic/Focus:  Coping With Mental Health Crisis:   The purpose of this group is to help patients identify strategies for coping with mental health crisis.  Group discusses possible causes of crisis and ways to manage them effectively.    Participation Level:  Active  Participation Quality:  Appropriate and Attentive  Affect:  Appropriate  Cognitive:  Alert, Confused, and Lacking  Insight: Appropriate  Engagement in Group:  Engaged, Off Topic, and Supportive  Modes of Intervention:  Activity, Discussion, Orientation, and Socialization  Additional Comments:  Pt would speak when spoken to. However a lot of the conversation on his side was hard to understand and seemed to be off topic   Maglione,Cyrilla Durkin E 08/01/2024, 11:14 AM

## 2024-08-01 NOTE — BHH Counselor (Signed)
 CSW contacted pt's sister [name] [phone] with Dr.J.   Provider gave clinical updates regarding pt.   CSW informed [name] of open APS case and possible guardianship.   CSW confirmed discharge date of Tuesday 08/04/24.   CSW and provider will confirm discharge on Monday.   Lum Croft, MSW, [place] 08/01/2024 3:03 PM

## 2024-08-01 NOTE — Group Note (Signed)
 Date:  08/01/2024 Time:  3:31 PM  Group Topic/Focus:  Movement Therapy    Participation Level:  None  Participation Quality:  none  Affect:  none  Cognitive:  none  Insight: None  Engagement in Group:  None  Modes of Intervention:  Activity  Additional Comments:  fell asleep in group  [name] 08/01/2024, 3:31 PM

## 2024-08-01 NOTE — Plan of Care (Signed)
  Problem: Coping: Goal: Ability to demonstrate self-control will improve Outcome: Progressing   Problem: Health Behavior/Discharge Planning: Goal: Compliance with treatment plan for underlying cause of condition will improve Outcome: Not Progressing

## 2024-08-01 NOTE — Progress Notes (Signed)
   08/01/24 2128  Psych Admission Type (Psych Patients Only)  Admission Status Involuntary  Psychosocial Assessment  Patient Complaints Worrying;Anxiety  Eye Contact Fair  Facial Expression Animated  Affect Preoccupied  Speech Tangential  Interaction Arrogant  Motor Activity Slow  Appearance/Hygiene Body odor;In scrubs (pt encouraged to shower pt requesting to wait until the morning)  Behavior Characteristics Appropriate to situation;Cooperative  Mood Preoccupied  Thought Ship Broker  Content Preoccupation;Paranoia  Delusions Paranoid  Perception WDL  Hallucination None reported or observed  Judgment Poor  Confusion Mild  Danger to Self  Current suicidal ideation? Denies  Agreement Not to Harm Self Yes  Description of Agreement verbal  Danger to Others  Danger to Others None reported or observed

## 2024-08-01 NOTE — BH IP Treatment Plan (Signed)
 Interdisciplinary Treatment and Diagnostic Plan Update  08/01/2024 Time of Session: 3:00 PM  Miguel [name] MRN: [id number]  Principal Diagnosis: <principal problem not specified>  Secondary Diagnoses: Active Problems:   Schizophrenia, paranoid (HCC)   Dementia with behavioral disturbance (HCC)   Current Medications:  Current Facility-Administered Medications  Medication Dose Route Frequency Provider Last Rate Last Admin   acetaminophen  (TYLENOL ) tablet 650 mg  650 mg Oral Q6H PRN Hampton, Tracie B, NP   650 mg at 08/01/24 0904   alum & mag hydroxide-simeth (MAALOX/MYLANTA) 200-200-20 MG/5ML suspension 30 mL  30 mL Oral Q4H PRN Hampton, Tracie B, NP   30 mL at 07/22/24 1639   amiodarone  (PACERONE ) tablet 400 mg  400 mg Oral Daily [name], [name], MD   400 mg at 07/31/24 0929   ARIPiprazole  ER (ABILIFY  MAINTENA) injection 400 mg  400 mg Intramuscular Q28 days [name], [name], MD   400 mg at 07/27/24 0929   feeding supplement (ENSURE PLUS HIGH PROTEIN) liquid 237 mL  237 mL Oral TID BM [name], [name], MD   237 mL at 08/01/24 0905   hydrOXYzine  (ATARAX ) tablet 25 mg  25 mg Oral TID PRN Hampton, Tracie B, NP   25 mg at 07/30/24 2101   magnesium  hydroxide (MILK OF MAGNESIA) suspension 30 mL  30 mL Oral Daily PRN Hampton, Tracie B, NP       metoprolol  tartrate (LOPRESSOR ) tablet 25 mg  25 mg Oral BID [name], [name], MD   25 mg at 07/31/24 2134   OLANZapine  (ZYPREXA ) injection 5 mg  5 mg Intramuscular TID PRN Hampton, Tracie B, NP       OLANZapine  zydis (ZYPREXA ) disintegrating tablet 5 mg  5 mg Oral TID PRN Hampton, Tracie B, NP       oxyCODONE  (Oxy IR/ROXICODONE ) immediate release tablet 5 mg  5 mg Oral Q4H PRN Dorinda Drue DASEN, MD   5 mg at 07/30/24 2100   pantoprazole  (PROTONIX ) EC tablet 40 mg  40 mg Oral Daily [name], [name], MD   40 mg at 07/31/24 9070   traZODone  (DESYREL ) tablet 50 mg  50 mg Oral QHS PRN Hampton, Tracie B, NP   50 mg at 07/31/24 2134   PTA Medications: Medications  Prior to Admission  Medication Sig Dispense Refill Last Dose/Taking   amiodarone  (PACERONE ) 400 MG tablet Place 1 tablet (400 mg total) into feeding tube daily.      ARIPiprazole  (ABILIFY ) 30 MG tablet Place 1 tablet (30 mg total) into feeding tube at bedtime.      bisacodyl  (DULCOLAX) 10 MG suppository Place 1 suppository (10 mg total) rectally daily as needed for moderate constipation or severe constipation.      busPIRone  (BUSPAR ) 5 MG tablet Take 5 mg by mouth 3 (three) times daily.      Chlorhexidine  Gluconate Cloth 2 % PADS Apply 6 each topically daily.      digoxin  (LANOXIN ) 0.125 MG tablet 1 tablet (0.125 mg total) by Per NG tube route daily. (Patient taking differently: Take 0.125 mg by mouth every other day.)      docusate (COLACE) 50 MG/5ML liquid Place 10 mLs (100 mg total) into feeding tube 2 (two) times daily.      metoprolol  tartrate (LOPRESSOR ) 25 MG tablet Take 25 mg by mouth 2 (two) times daily.      pantoprazole  (PROTONIX ) 40 MG tablet Take 40 mg by mouth daily.      traMADol  (ULTRAM ) 50 MG tablet Take 50 mg  by mouth 2 (two) times daily.       Patient Stressors: Educational concerns   Health problems   Medication change or noncompliance   Other: Stated transitional housing concerns    Patient Strengths: Manufacturing systems engineer  Religious Affiliation   Treatment Modalities: Medication Management, Group therapy, Case management,  1 to 1 session with clinician, Psychoeducation, Recreational therapy.   Physician Treatment Plan for Primary Diagnosis: <principal problem not specified> Long Term Goal(s):     Short Term Goals:    Medication Management: Evaluate patient's response, side effects, and tolerance of medication regimen.  Therapeutic Interventions: 1 to 1 sessions, Unit Group sessions and Medication administration.  Evaluation of Outcomes: Not Progressing  Physician Treatment Plan for Secondary Diagnosis: Active Problems:   Schizophrenia, paranoid (HCC)    Dementia with behavioral disturbance (HCC)  Long Term Goal(s):     Short Term Goals:       Medication Management: Evaluate patient's response, side effects, and tolerance of medication regimen.  Therapeutic Interventions: 1 to 1 sessions, Unit Group sessions and Medication administration.  Evaluation of Outcomes: Not Progressing   RN Treatment Plan for Primary Diagnosis: <principal problem not specified> Long Term Goal(s): Knowledge of disease and therapeutic regimen to maintain health will improve  Short Term Goals: Ability to remain free from injury will improve, Ability to verbalize frustration and anger appropriately will improve, Ability to demonstrate self-control, Ability to participate in decision making will improve, Ability to verbalize feelings will improve, Ability to disclose and discuss suicidal ideas, Ability to identify and develop effective coping behaviors will improve, and Compliance with prescribed medications will improve  Medication Management: RN will administer medications as ordered by provider, will assess and evaluate patient's response and provide education to patient for prescribed medication. RN will report any adverse and/or side effects to prescribing provider.  Therapeutic Interventions: 1 on 1 counseling sessions, Psychoeducation, Medication administration, Evaluate responses to treatment, Monitor vital signs and CBGs as ordered, Perform/monitor CIWA, COWS, AIMS and Fall Risk screenings as ordered, Perform wound care treatments as ordered.  Evaluation of Outcomes: Not Progressing   LCSW Treatment Plan for Primary Diagnosis: <principal problem not specified> Long Term Goal(s): Safe transition to appropriate next level of care at discharge, Engage patient in therapeutic group addressing interpersonal concerns.  Short Term Goals: Engage patient in aftercare planning with referrals and resources, Increase social support, Increase ability to appropriately  verbalize feelings, Increase emotional regulation, Facilitate acceptance of mental health diagnosis and concerns, Facilitate patient progression through stages of change regarding substance use diagnoses and concerns, Identify triggers associated with mental health/substance abuse issues, and Increase skills for wellness and recovery  Therapeutic Interventions: Assess for all discharge needs, 1 to 1 time with Social worker, Explore available resources and support systems, Assess for adequacy in community support network, Educate family and significant other(s) on suicide prevention, Complete Psychosocial Assessment, Interpersonal group therapy.  Evaluation of Outcomes: Not Progressing   Progress in Treatment: Attending groups: Yes. and No. Participating in groups: Yes. and No. Taking medication as prescribed: Yes. Toleration medication: Yes. Family/Significant other contact made: No, will contact:  CSW to contact once permission is granted.  Patient understands diagnosis: Yes. Discussing patient identified problems/goals with staff: Yes. Medical problems stabilized or resolved: Yes. Denies suicidal/homicidal ideation: Yes. Issues/concerns per patient self-inventory: No. Other: None   New problem(s) identified: No, Describe:  None Update 07/26/24: No changes at this time Update 08/01/24: No changes at this time   New Short Term/Long Term Goal(s):detox, elimination  of symptoms of psychosis, medication management for mood stabilization; elimination of SI thoughts; development of comprehensive mental wellness/sobriety plan.  Update 07/26/24: No changes at this time Update 08/01/24: No changes at this time   Patient Goals:  Make sure everything is settled. Update 07/26/24: No changes at this time Update 08/01/24: No changes at this time   Discharge Plan or Barriers: CSW to assist with the development of appropriate discharge plan.  Update 07/26/24: No changes at this time Update 08/01/24: APS  Report made for pt, screened it.    Reason for Continuation of Hospitalization: Aggression Anxiety Delusions  Depression Hallucinations Suicidal ideation   Estimated Length of Stay: 1-7 days. Update 07/26/24: TBD Update 08/01/24: TBD  Last 3 [place] Suicide Severity Risk Score: Flowsheet Row Admission (Current) from 07/18/2024 in [facility] ED from 07/17/2024 in [facility] ED to Hosp-Admission (Discharged) from 02/20/2024 in [facility] ICU[facility]CCU  C-SSRS RISK CATEGORY No Risk No Risk No Risk    Last PHQ 2/9 Scores:     No data to display          Scribe for Treatment Team: [name], LCSWA 08/01/2024 11:02 AM

## 2024-08-01 NOTE — Progress Notes (Signed)
 [facility] MD Progress Note  08/01/2024 12:04 PM DELORES THELEN  MRN:  [id number]  Clemons Salvucci. [place] is a 73 year old male with a past psychiatric history significant for schizophrenia who presents under involuntary commitment initiated by family due to medication non-adherence, increasing paranoia, and behavioral disorganization. 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 Brother reports multiple episodes of patient whispering through the night, stating people were talking about him and planning to harm him. Patient was advised by his ACT Team to come to the ED; he refused, prompting the family to pursue IVC.Patient is admitted to [facility] unit with Q15 min safety monitoring. Multidisciplinary team approach is offered. Medication management; group/milieu therapy is offered.  Subjective:  Chart reviewed, case discussed in multidisciplinary meeting, patient seen during rounds.   = On interview patient is noted to be sitting in the day area.  He offers no complaints.  He talks about his sacral wound being cleaned by the nursing.  Per nursing patient came to the nurses station asking for his medication and every time the nurse brought him the medication he declined saying he does not need any heart medications and he continues to decline having any history of heart attacks.  With this provider he is calm cooperative denies SI/HI/plan and denies hallucinations.  He remains disorganized at baseline and his thought process and talks about wanting to live independently.  He is unable to acknowledge his  medical problems.  Capacity evaluation: Patient is unable to acknowledge and discuss his ongoing medical problems including his recent medical admission for heart attacks, hypoxic brain injury, treatment.  He is unable to appreciate and discussed the pros and cons of the need for medications and not taking medications.  Patient lacks capacity to make medical decisions at this time. Past Psychiatric History: see h&P Family History:  Family History  Family history unknown: Yes   Social History:  Social History   Substance and Sexual Activity  Alcohol Use No     Social History   Substance and Sexual Activity  Drug Use No    Social History   Socioeconomic History   Marital status: Divorced    Spouse name: Not on file   Number of children: Not on file   Years of education: Not on file   Highest education level: Not on file  Occupational History   Not on file  Tobacco Use   Smoking status: Every Day    Current packs/day: 0.50    Average packs/day: 0.5 packs/day for 45.9 years (23.0 ttl pk-yrs)    Types: Cigarettes    Start date: 38   Smokeless tobacco: Never  Vaping Use   Vaping status: Never Used  Substance and Sexual Activity   Alcohol use: No   Drug use: No   Sexual activity: Not on file  Other Topics Concern   Not on file  Social History Narrative   Not on file   Social Drivers of Health   Financial Resource Strain: Not on file  Food Insecurity: Patient Unable To Answer (07/18/2024)   Hunger Vital Sign    Worried About Running Out of Food in the Last Year: Patient unable to answer    Ran Out  of Food in the Last Year: Patient unable to answer  Transportation Needs: Unmet Transportation Needs (07/18/2024)   PRAPARE - Administrator, Civil Service (Medical): Yes    Lack of Transportation (Non-Medical): Yes  Physical Activity: Not on file  Stress: Not on file  Social Connections: Patient Unable To Answer (07/18/2024)   Social Connection and  Isolation Panel    Frequency of Communication with Friends and Family: Patient unable to answer    Frequency of Social Gatherings with Friends and Family: Patient unable to answer    Attends Religious Services: Patient unable to answer    Active Member of Clubs or Organizations: Patient unable to answer    Attends Banker Meetings: Patient unable to answer    Marital Status: Patient unable to answer   Past Medical History:  Past Medical History:  Diagnosis Date   Biventricular failure (HCC)    CVA (cerebral vascular accident) (HCC)    Polysubstance use disorder    Schizophrenia (HCC)     Past Surgical History:  Procedure Laterality Date   APPENDECTOMY     TRACHEOSTOMY TUBE PLACEMENT N/A 03/04/2024   Procedure: CREATION, TRACHEOSTOMY;  Surgeon: [name] Massie RAMAN, MD;  Location: [facility] ORS;  Service: ENT;  Laterality: N/A;    Current Medications: Current Facility-Administered Medications  Medication Dose Route Frequency Provider Last Rate Last Admin   acetaminophen  (TYLENOL ) tablet 650 mg  650 mg Oral Q6H PRN Hampton, Tracie B, NP   650 mg at 08/01/24 0904   alum & mag hydroxide-simeth (MAALOX/MYLANTA) 200-200-20 MG/5ML suspension 30 mL  30 mL Oral Q4H PRN Hampton, Tracie B, NP   30 mL at 07/22/24 1639   amiodarone  (PACERONE ) tablet 400 mg  400 mg Oral Daily [name], [name], MD   400 mg at 07/31/24 0929   ARIPiprazole  ER (ABILIFY  MAINTENA) injection 400 mg  400 mg Intramuscular Q28 days Steffi Noviello, MD   400 mg at 07/27/24 0929   feeding supplement (ENSURE PLUS HIGH PROTEIN) liquid 237 mL  237 mL Oral TID BM [name], [name], MD   237 mL at 08/01/24 0905   hydrOXYzine  (ATARAX ) tablet 25 mg  25 mg Oral TID PRN Hampton, Tracie B, NP   25 mg at 07/30/24 2101   magnesium  hydroxide (MILK OF MAGNESIA) suspension 30 mL  30 mL Oral Daily PRN Hampton, Tracie B, NP       metoprolol  tartrate (LOPRESSOR ) tablet 25 mg  25 mg Oral BID [name], [name], MD   25 mg at 07/31/24 2134    OLANZapine  (ZYPREXA ) injection 5 mg  5 mg Intramuscular TID PRN Hampton, Tracie B, NP       OLANZapine  zydis (ZYPREXA ) disintegrating tablet 5 mg  5 mg Oral TID PRN Hampton, Tracie B, NP       oxyCODONE  (Oxy IR/ROXICODONE ) immediate release tablet 5 mg  5 mg Oral Q4H PRN Dorinda Homans T, MD   5 mg at 07/30/24 2100   pantoprazole  (PROTONIX ) EC tablet 40 mg  40 mg Oral Daily [name], [name], MD   40 mg at 07/31/24 9070   traZODone  (DESYREL ) tablet 50 mg  50 mg Oral QHS PRN Hampton, Tracie B, NP   50 mg at 07/31/24 2134    Lab Results:  No results found for this or any previous visit (from the past 48 hours).   Blood Alcohol level:  Lab Results  Component Value Date   [facility] <15 07/17/2024   ETH <15 02/20/2024  Metabolic Disorder Labs: Lab Results  Component Value Date   HGBA1C 5.8 (H) 07/14/2018   MPG 119.76 07/14/2018   MPG 131.24 07/13/2018   No results found for: PROLACTIN Lab Results  Component Value Date   CHOL 170 07/13/2018   TRIG 34 03/08/2024   HDL 41 07/13/2018   CHOLHDL 4.1 07/13/2018   VLDL 10 07/13/2018   LDLCALC 119 (H) 07/13/2018    Physical Findings: AIMS:  , ,  ,  ,    CIWA:    COWS:      Psychiatric Specialty Exam:  Presentation  General Appearance: Appropriate for Environment  Eye Contact:Fleeting  Speech:Normal Rate  Speech Volume:Decreased    Mood and Affect  Mood:Anxious  Affect:Flat   Thought Process  Thought Processes:circumstantial Orientation:Partial  Thought Content:discharge focused  Hallucinations:denies  Ideas of Reference:Delusions At baseline Suicidal Thoughts:denies  Homicidal Thoughts:denies   Sensorium  Memory:Immediate Poor; Recent Poor; Remote Poor  Judgment:Impaired  Insight:None   Executive Functions  Concentration:Poor  Attention Span:Poor  Recall:Poor  Fund of Knowledge:Poor  Language:Fair   Psychomotor Activity  Psychomotor Activity:No data recorded  Musculoskeletal: Strength &  Muscle Tone: within normal limits Gait & Station: normal Assets  Assets:Communication Skills; Desire for Improvement; Social Support    Physical Exam: Physical Exam Vitals and nursing note reviewed.    ROS Blood pressure (!) 111/52, pulse 64, temperature 98.1 F (36.7 C), resp. rate 20, height 6' 1 (1.854 m), weight 81.2 kg, SpO2 100%. Body mass index is 23.62 kg/m.  Diagnosis: Active Problems:   Schizophrenia, paranoid (HCC)   Dementia with behavioral disturbance (HCC)   PLAN: Safety and Monitoring:  -- Involuntary admission to inpatient psychiatric unit for safety, stabilization and treatment  -- Daily contact with patient to assess and evaluate symptoms and progress in treatment  -- Patient's case to be discussed in multi-disciplinary team meeting  -- Observation Level : q15 minute checks  -- Vital signs:  q12 hours  -- Precautions: suicide, elopement, and assault -- Encouraged patient to participate in unit milieu and in scheduled group therapies  2. Psychiatric Treatment:  Scheduled Medications:  Discontinue Abilify  30 mg daily- LAY Abilify  Maintena 400 mg q4 weeks given on 07/27/24  Cogentin  1mg  one dose given for EPS -- The risks/benefits/side-effects/alternatives to this medication were discussed in detail with the patient and time was given for questions. The patient consents to medication trial.  3. Medical Issues Being Addressed:  [facility] consulted for wound care management   Assessment and Plan:   Sacral Decubitus Ulcer Full thickness decubitus ulcer in setting of recent extended hospitalization, discharged from [facility] 14th Wound looks pretty good overall WOC on board we appreciate input   Cognitive Deficit Concern for anoxic brain injury at discharge 02/2024 Delirium precautions   Peripheral Vascular Disease Legs with features c/w peripheral vascular disease, diminished DP pulses Would benefit from outpatient ABI's   HFrEF Euvolemic at this  time Continue home medications   Hx Atrial Flutter Not on any anticoagulation at this time EKG with NSR Continue amiodarone   Hypertension Noted, adjust meds gradually after med rec complete     4. Discharge Planning:   -- Social work and case management to assist with discharge planning and identification of hospital follow-up needs prior to discharge  -- Estimated LOS: 3-4 days  Zacaria Pousson, MD 08/01/2024, 12:04 PM

## 2024-08-01 NOTE — Progress Notes (Signed)
 Behavior:  Argumentative and confused.  Lacks insight about medical history.    Psych assessment: Denies SI/HI and AVH.  Labile.    Interaction / Group attendance:  Present in the milieu and pacing in hallways.  Appropriate interaction with peers.   Medication/ PRNs: Refused. Dr. JINNY aware.  PRNs for pain and indigestion given as ordered.   Pain: 6/10 in legs  15 min checks in place for safety.    Wound cleaned and dressing changed by Eleanor, RN.

## 2024-08-01 NOTE — Plan of Care (Signed)
  Problem: Education: Goal: Knowledge of [place] [facility] Education information/materials will improve 08/01/2024 2131 by Geneva Steward CROME, RN Outcome: Progressing 08/01/2024 2131 by Geneva Steward CROME, RN Outcome: Progressing   Problem: Activity: Goal: Interest or engagement in activities will improve 08/01/2024 2131 by Geneva Steward CROME, RN Outcome: Progressing 08/01/2024 2131 by Geneva Steward CROME, RN Outcome: Progressing   Problem: Coping: Goal: Ability to verbalize frustrations and anger appropriately will improve 08/01/2024 2131 by Geneva Steward CROME, RN Outcome: Progressing 08/01/2024 2131 by Geneva Steward CROME, RN Outcome: Progressing   Problem: Education: Goal: Knowledge of [place] [facility] Education information/materials will improve 08/01/2024 2131 by Geneva Steward CROME, RN Outcome: Progressing 08/01/2024 2131 by Geneva Steward CROME, RN Outcome: Progressing Goal: Emotional status will improve 08/01/2024 2131 by Geneva Steward CROME, RN Outcome: Progressing 08/01/2024 2131 by Geneva Steward CROME, RN Outcome: Progressing Goal: Mental status will improve 08/01/2024 2131 by Geneva Steward CROME, RN Outcome: Progressing 08/01/2024 2131 by Geneva Steward CROME, RN Outcome: Progressing Goal: Verbalization of understanding the information provided will improve 08/01/2024 2131 by Geneva Steward CROME, RN Outcome: Progressing 08/01/2024 2131 by Geneva Steward CROME, RN Outcome: Progressing   Problem: Activity: Goal: Interest or engagement in activities will improve 08/01/2024 2131 by Geneva Steward CROME, RN Outcome: Progressing 08/01/2024 2131 by Geneva Steward CROME, RN Outcome: Progressing Goal: Sleeping patterns will improve 08/01/2024 2131 by Geneva Steward CROME, RN Outcome: Progressing 08/01/2024 2131 by Geneva Steward CROME, RN Outcome: Progressing   Problem: Coping: Goal: Ability to verbalize frustrations and anger appropriately will improve 08/01/2024 2131 by Geneva Steward CROME, RN Outcome: Progressing 08/01/2024 2131 by Geneva Steward CROME, RN Outcome: Progressing Goal: Ability to demonstrate self-control will improve 08/01/2024 2131 by Geneva Steward CROME, RN Outcome: Progressing 08/01/2024 2131 by Geneva Steward CROME, RN Outcome: Progressing   Problem: Health Behavior/Discharge Planning: Goal: Identification of resources available to assist in meeting health care needs will improve 08/01/2024 2131 by Geneva Steward CROME, RN Outcome: Progressing 08/01/2024 2131 by Geneva Steward CROME, RN Outcome: Progressing Goal: Compliance with treatment plan for underlying cause of condition will improve 08/01/2024 2131 by Geneva Steward CROME, RN Outcome: Progressing 08/01/2024 2131 by Geneva Steward CROME, RN Outcome: Progressing   Problem: Physical Regulation: Goal: Ability to maintain clinical measurements within normal limits will improve 08/01/2024 2131 by Geneva Steward CROME, RN Outcome: Progressing 08/01/2024 2131 by Geneva Steward CROME, RN Outcome: Progressing   Problem: Safety: Goal: Periods of time without injury will increase 08/01/2024 2131 by Geneva Steward CROME, RN Outcome: Progressing 08/01/2024 2131 by Geneva Steward CROME, RN Outcome: Progressing   Problem: Coping: Goal: Ability to adjust to condition or change in health will improve 08/01/2024 2131 by Geneva Steward CROME, RN Outcome: Progressing 08/01/2024 2131 by Geneva Steward CROME, RN Outcome: Progressing   Problem: Health Behavior/Discharge Planning: Goal: Ability to identify and utilize available resources and services will improve 08/01/2024 2131 by Geneva Steward CROME, RN Outcome: Progressing 08/01/2024 2131 by Geneva Steward CROME, RN Outcome: Progressing

## 2024-08-02 NOTE — Group Note (Signed)
 Date:  08/02/2024 Time:  5:22 PM  Group Topic/Focus:  Self Care:   The focus of this group is to help patients understand the importance of self-care in order to improve or restore emotional, physical, spiritual, interpersonal, and financial health.    Participation Level:  Minimal  Participation Quality:  Attentive  Affect:  Flat, Not Congruent, and Resistant  Cognitive:  Confused, Delusional, Hallucinating, and Lacking  Insight: Limited  Engagement in Group:  Limited  Modes of Intervention:  Orientation  Additional Comments:     Maglione,Neko Mcgeehan E 08/02/2024, 5:22 PM

## 2024-08-02 NOTE — Group Note (Signed)
 LCSW Group Therapy Note  Group Date: 08/02/2024 Start Time: 1045 End Time: 1130   Type of Therapy and Topic:  Group Therapy: Using I Statements  Participation Level:  Active  Description of Group:  Patients were asked to provide details of some interpersonal conflicts they have experienced. Patients were then educated about "I" statements, communication which focuses on feelings or views of the speaker rather than what the other person is doing. T group members were asked to reflect on past conflicts and to provide specific examples for utilizing "I" statements.  Therapeutic Goals:  Patients will verbalize understanding of ineffective communication and effective communication. Patients will be able to empathize with whom they are having conflict. Patients will practice effective communication in the form of "I" statements.    Summary of Patient Progress:  The patient was present/active throughout the session and proved open to feedback from CSW and peers. Patient demonstrated insight into the subject matter, was respectful of peers, and was present throughout the entire session.  Therapeutic Modalities:   Cognitive Behavioral Therapy Solution-Focused Therapy    Rexene LELON Chesley KEN 08/02/2024  12:45 PM

## 2024-08-02 NOTE — Plan of Care (Signed)
  Problem: Activity: Goal: Interest or engagement in activities will improve Outcome: Progressing   Problem: Activity: Goal: Interest or engagement in activities will improve Outcome: Progressing   Problem: Education: Goal: Knowledge of [facility] Education information/materials will improve Outcome: Not Progressing Goal: Mental status will improve Outcome: Not Progressing

## 2024-08-02 NOTE — Consult Note (Signed)
 WOC Nurse Consult Note: Reason for Consult: Concern for wound progressing  Wound type: Non healing Stage 3 Pressure Injury; undermining present  Pressure Injury POA: Yes Measurement: see nursing flowsheet Wound azi:ejoz, non granular Drainage (amount, consistency, odor) see nursing flow sheet Periwound: intact Dressing procedure/placement/frequency: Cleanse sacral wound with Vashe Soila [phone]), moisten (not soaked) 4x4 gauze with Vashe, fluffed out and filled into space (making sure to pack in undermining or tunneling, fully fill wound bed space, top with ABD pad secure with tape. Change BID  If patient to be sitting up most of the day, order Gerlean 8047 chair pressure redistribution pad  Re consult if needed, will not follow at this time. Thanks  Huma [employer], RN,CWOCN, CNS, [employer] [phone]

## 2024-08-02 NOTE — Group Note (Signed)
 Date:  08/02/2024 Time:  9:19 PM  Group Topic/Focus:  Wrap-Up Group:   The focus of this group is to help patients review their daily goal of treatment and discuss progress on daily workbooks.    Participation Level:  Active  Participation Quality:  Appropriate  Affect:  Appropriate  Cognitive:  Alert  Insight: Appropriate  Engagement in Group:  Engaged  Modes of Intervention:  Discussion  Additional Comments:    Miguel [name] Bunker 08/02/2024, 9:19 PM

## 2024-08-02 NOTE — Group Note (Signed)
 Date:  08/02/2024 Time:  10:43 AM  Group Topic/Focus:  Goals/Emotion Regulation Group:   The focus of this group is to help patients establish daily goals to achieve during treatment and discuss how the patient can incorporate goal setting into their daily lives to aide in recovery. Pt also learned what emotion regulation was and shared their thoughts and opinion on the topic.   Participation Level:  None  Irlanda Croghan T [name] 08/02/2024, 10:43 AM

## 2024-08-02 NOTE — Progress Notes (Signed)
 Behavior: Agitated, argumentative and confused.   Psych assessment: Irritable.  Denies SI/HI and AVH.  Interaction / Group attendance:  Present in the milieu.  Appropriate interaction with peers.  Defensive and argumentative with staff.  Medication/ PRNs: Compliant.  Pain: Denies.  15 min checks in place for safety.    Pt came to nurse's station requesting his medications.  20 minutes after, pt returned to nurse's station stating no one had brought him his medications.  Staff attempted to re-orient pt that his medications had been given.  Pt became irritable and walked to dayroom.   Pt does not remember eating lunch and is irritated that no one called for him to come eat.  Also insists someone took clothing from his room.

## 2024-08-02 NOTE — Progress Notes (Signed)
 [facility] MD Progress Note  08/02/2024 12:15 PM Miguel [name]  MRN:  [id number]  Miguel [name] is a 73 year old male with a past psychiatric history significant for schizophrenia who presents under involuntary commitment initiated by family due to medication non-adherence, increasing paranoia, and behavioral disorganization. 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 Brother reports multiple episodes of patient whispering through the night, stating people were talking about him and planning to harm him. Patient was advised by his ACT Team to come to the ED; he refused, prompting the family to pursue IVC.Patient is admitted to [facility] unit with Q15 min safety monitoring. Multidisciplinary team approach is offered. Medication management; group/milieu therapy is offered.  Subjective:  Chart reviewed, case discussed in multidisciplinary meeting, patient seen during rounds.   Patient is noted to be resting in the day area.  He wanted to talk to the provider.  He complains about multiple staff members but refers them as different people coming into his room and telling him what to do and what not to do.  He reports being upset about people watching him around.  He then refers to his family saying they want him to do according to themself.  He is not endorsing SI/HI/plan and denies hallucinations.  Per nursing staff patient is noted to be very confused as he is coming to the nurses station multiple times asking for medications and even after taking his medications he is coming back saying nobody  is giving him medications.  Capacity evaluation: Patient is unable to acknowledge and discuss his ongoing medical problems including his recent medical admission for heart attacks, hypoxic brain injury, treatment.  He is unable to appreciate and discussed the pros and cons of the need for medications and not taking medications.  Patient lacks capacity to make medical decisions at this time. Past Psychiatric History: see h&P Family History:  Family History  Family history unknown: Yes   Social History:  Social History   Substance and Sexual Activity  Alcohol Use No     Social History   Substance and Sexual Activity  Drug Use No    Social History   Socioeconomic History   Marital status: Divorced    Spouse name: Not on file   Number of children: Not on file   Years of education: Not on file   Highest education level: Not on file  Occupational History   Not on file  Tobacco Use   Smoking status: Every Day    Current packs/day: 0.50    Average packs/day: 0.5 packs/day for 45.9 years (23.0 ttl pk-yrs)    Types: Cigarettes    Start date: 63   Smokeless tobacco: Never  Vaping Use   Vaping status: Never Used  Substance and Sexual Activity   Alcohol use: No   Drug use: No   Sexual activity: Not on file  Other Topics Concern   Not on file  Social History Narrative   Not on file   Social Drivers of Health   Financial Resource Strain: Not on file  Food Insecurity: Patient Unable To Answer (07/18/2024)   Hunger Vital Sign    Worried About Running Out of Food in the Last Year:  Patient unable to answer    Ran Out of Food in the Last Year: Patient unable to answer  Transportation Needs: Unmet Transportation Needs (07/18/2024)   PRAPARE - Administrator, Civil Service (Medical): Yes    Lack of Transportation (Non-Medical): Yes  Physical Activity: Not on file  Stress: Not on file  Social Connections: Patient Unable To Answer (07/18/2024)   Social Connection and  Isolation Panel    Frequency of Communication with Friends and Family: Patient unable to answer    Frequency of Social Gatherings with Friends and Family: Patient unable to answer    Attends Religious Services: Patient unable to answer    Active Member of Clubs or Organizations: Patient unable to answer    Attends Banker Meetings: Patient unable to answer    Marital Status: Patient unable to answer   Past Medical History:  Past Medical History:  Diagnosis Date   Biventricular failure (HCC)    CVA (cerebral vascular accident) (HCC)    Polysubstance use disorder    Schizophrenia (HCC)     Past Surgical History:  Procedure Laterality Date   APPENDECTOMY     TRACHEOSTOMY TUBE PLACEMENT N/A 03/04/2024   Procedure: CREATION, TRACHEOSTOMY;  Surgeon: [name] Massie RAMAN, MD;  Location: [facility] ORS;  Service: ENT;  Laterality: N/A;    Current Medications: Current Facility-Administered Medications  Medication Dose Route Frequency Provider Last Rate Last Admin   acetaminophen  (TYLENOL ) tablet 650 mg  650 mg Oral Q6H PRN Hampton, Tracie B, NP   650 mg at 08/01/24 0904   alum & mag hydroxide-simeth (MAALOX/MYLANTA) 200-200-20 MG/5ML suspension 30 mL  30 mL Oral Q4H PRN Hampton, Tracie B, NP   30 mL at 08/01/24 1440   amiodarone  (PACERONE ) tablet 400 mg  400 mg Oral Daily [name], [name], MD   400 mg at 08/02/24 9166   ARIPiprazole  ER (ABILIFY  MAINTENA) injection 400 mg  400 mg Intramuscular Q28 days Suleika Donavan, MD   400 mg at 07/27/24 0929   feeding supplement (ENSURE PLUS HIGH PROTEIN) liquid 237 mL  237 mL Oral TID BM [name], [name], MD   237 mL at 08/02/24 0836   hydrOXYzine  (ATARAX ) tablet 25 mg  25 mg Oral TID PRN Hampton, Tracie B, NP   25 mg at 08/01/24 2105   magnesium  hydroxide (MILK OF MAGNESIA) suspension 30 mL  30 mL Oral Daily PRN Hampton, Tracie B, NP       metoprolol  tartrate (LOPRESSOR ) tablet 25 mg  25 mg Oral BID [name], [name], MD   25 mg at 08/02/24 0834   naproxen   (NAPROSYN ) tablet 250 mg  250 mg Oral BID WC Roselina Burgueno, MD   250 mg at 08/02/24 9165   OLANZapine  (ZYPREXA ) injection 5 mg  5 mg Intramuscular TID PRN Hampton, Tracie B, NP       OLANZapine  zydis (ZYPREXA ) disintegrating tablet 5 mg  5 mg Oral TID PRN Hampton, Tracie B, NP       oxyCODONE  (Oxy IR/ROXICODONE ) immediate release tablet 5 mg  5 mg Oral Q4H PRN Dorinda Homans T, MD   5 mg at 07/30/24 2100   pantoprazole  (PROTONIX ) EC tablet 40 mg  40 mg Oral Daily [name], [name], MD   40 mg at 08/02/24 0833   traZODone  (DESYREL ) tablet 50 mg  50 mg Oral QHS PRN Hampton, Tracie B, NP   50 mg at 08/01/24 2105    Lab Results:  No results found for this  or any previous visit (from the past 48 hours).   Blood Alcohol level:  Lab Results  Component Value Date   [facility] <15 07/17/2024   ETH <15 02/20/2024    Metabolic Disorder Labs: Lab Results  Component Value Date   HGBA1C 5.8 (H) 07/14/2018   MPG 119.76 07/14/2018   MPG 131.24 07/13/2018   No results found for: PROLACTIN Lab Results  Component Value Date   CHOL 170 07/13/2018   TRIG 34 03/08/2024   HDL 41 07/13/2018   CHOLHDL 4.1 07/13/2018   VLDL 10 07/13/2018   LDLCALC 119 (H) 07/13/2018    Physical Findings: AIMS:  , ,  ,  ,    CIWA:    COWS:      Psychiatric Specialty Exam:  Presentation  General Appearance: Appropriate for Environment  Eye Contact:Fleeting  Speech:Normal Rate  Speech Volume:Decreased    Mood and Affect  Mood:Anxious  Affect:Flat   Thought Process  Thought Processes:circumstantial Orientation:Partial  Thought Content:discharge focused  Hallucinations:denies  Ideas of Reference:Delusions At baseline Suicidal Thoughts:denies  Homicidal Thoughts:denies   Sensorium  Memory:Immediate Poor; Recent Poor; Remote Poor  Judgment:Impaired  Insight:None   Executive Functions  Concentration:Poor  Attention Span:Poor  Recall:Poor  Fund of  Knowledge:Poor  Language:Fair   Psychomotor Activity  Psychomotor Activity:No data recorded  Musculoskeletal: Strength & Muscle Tone: within normal limits Gait & Station: normal Assets  Assets:Communication Skills; Desire for Improvement; Social Support    Physical Exam: Physical Exam Vitals and nursing note reviewed.    ROS Blood pressure 112/80, pulse 69, temperature 97.9 F (36.6 C), resp. rate 18, height 6' 1 (1.854 m), weight 81.2 kg, SpO2 95%. Body mass index is 23.62 kg/m.  Diagnosis: Active Problems:   Schizophrenia, paranoid (HCC)   Dementia with behavioral disturbance (HCC)   PLAN: Safety and Monitoring:  -- Involuntary admission to inpatient psychiatric unit for safety, stabilization and treatment  -- Daily contact with patient to assess and evaluate symptoms and progress in treatment  -- Patient's case to be discussed in multi-disciplinary team meeting  -- Observation Level : q15 minute checks  -- Vital signs:  q12 hours  -- Precautions: suicide, elopement, and assault -- Encouraged patient to participate in unit milieu and in scheduled group therapies  2. Psychiatric Treatment:  Scheduled Medications:  Discontinue Abilify  30 mg daily- LAY Abilify  Maintena 400 mg q4 weeks given on 07/27/24  Cogentin  1mg  one dose given for EPS -- The risks/benefits/side-effects/alternatives to this medication were discussed in detail with the patient and time was given for questions. The patient consents to medication trial.  3. Medical Issues Being Addressed:  [facility] consulted for wound care management   Assessment and Plan:   Sacral Decubitus Ulcer Full thickness decubitus ulcer in setting of recent extended hospitalization, discharged from [facility] Nov 14th Wound looks pretty good overall WOC on board we appreciate input   Cognitive Deficit Concern for anoxic brain injury at discharge 02/2024 Delirium precautions   Peripheral Vascular Disease Legs with features c/w  peripheral vascular disease, diminished DP pulses Would benefit from outpatient ABI's   HFrEF Euvolemic at this time Continue home medications   Hx Atrial Flutter Not on any anticoagulation at this time EKG with NSR Continue amiodarone   Hypertension Noted, adjust meds gradually after med rec complete     4. Discharge Planning:   -- Social work and case management to assist with discharge planning and identification of hospital follow-up needs prior to discharge  -- Estimated LOS: 3-4 days  Allyn Foil, MD 08/02/2024, 12:15 PM

## 2024-08-02 NOTE — Progress Notes (Signed)
 AqauaCell packing was not present when the old dressing was removed.  Pt also stated he has been putting cream (lotion or barrier cream) on his wound.  Dr. JINNY made aware.

## 2024-08-03 NOTE — Progress Notes (Signed)
   08/03/24 1000  Psych Admission Type (Psych Patients Only)  Admission Status Involuntary  Psychosocial Assessment  Patient Complaints Anxiety;Confusion  Eye Contact Fair  Facial Expression Animated  Affect Anxious  Speech Tangential  Interaction Assertive  Motor Activity Slow  Appearance/Hygiene Unremarkable  Behavior Characteristics Anxious;Restless  Mood Preoccupied  Thought Process  Coherency Tangential  Content Blaming others  Delusions Paranoid  Perception WDL  Hallucination None reported or observed  Judgment Poor  Confusion WDL  Danger to Self  Current suicidal ideation? Denies  Danger to Others  Danger to Others None reported or observed

## 2024-08-03 NOTE — Group Note (Signed)
 Date:  08/03/2024 Time:  4:43 PM  Group Topic/Focus:  Making Healthy Choices:   The focus of this group is to help patients identify negative/unhealthy choices they were using prior to admission and identify positive/healthier coping strategies to replace them upon discharge.    Participation Level:  Active  Participation Quality:  Appropriate  Affect:  Appropriate  Cognitive:  Appropriate  Insight: Appropriate  Engagement in Group:  Engaged  Modes of Intervention:  Discussion   Arland Nutting 08/03/2024, 4:43 PM

## 2024-08-03 NOTE — Consult Note (Addendum)
 WOC Nurse follow-up consult Note: Refer to previous WOC remote consult on 12/7. Topical treatment orders have been provided for bedside nurses to perform at that time.  Requested to assess wound in person. 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  Wound is red and moist with small amt yellow drainage.    Topical treatment orders provided for bedside nurses to perform as follows: Pressure Injury POA: Yes Change sacrum dressing Q day as follows: cleanse sacral wound with Vashe Soila [phone]) pat dry. Pack wound Vashe moistened gauze, using a swab to fill the area, then cover with foam dressing. Soila # [phone]).  It is OK for the patient to shower without a dressing in place, then reapply.  Please re-consult if further assistance is needed.  Thank-you,  Stephane Fought MSN, RN, CWOCN, CWCN-AP, CNS Contact Mon-Fri 0700-1500: [phone]

## 2024-08-03 NOTE — Progress Notes (Signed)
 [facility] MD Progress Note  08/03/2024 8:59 PM RODGER GIANGREGORIO  MRN:  [id number]  Calib Wadhwa. Stahnke is a 73 year old male with a past psychiatric history significant for schizophrenia who presents under involuntary commitment initiated by family due to medication non-adherence, increasing paranoia, and behavioral disorganization. 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 Brother reports multiple episodes of patient whispering through the night, stating people were talking about him and planning to harm him. Patient was advised by his ACT Team to come to the ED; he refused, prompting the family to pursue IVC.Patient is admitted to [facility] unit with Q15 min safety monitoring. Multidisciplinary team approach is offered. Medication management; group/milieu therapy is offered.  Subjective:  Chart reviewed, case discussed in multidisciplinary meeting, patient seen during rounds.  Per nursing patient remains very confused and paranoid, coming to the nurses station multiple times asking for medications even though he took his medications then asking for food and spite of having his lunch.  Patient states in the day area and engages with the peers well with no problems.  Patient denies SI/HI/plan patient remains paranoid and delusional in the context of his cognitive impairment.  He perceives the information very differently regarding the wound care he needs.  He continues to lack insight into his medical problems, the need for medications, the pros and cons of the medications.  Past Psychiatric  History: see h&P Family History:  Family History  Family history unknown: Yes   Social History:  Social History   Substance and Sexual Activity  Alcohol Use No     Social History   Substance and Sexual Activity  Drug Use No    Social History   Socioeconomic History   Marital status: Divorced    Spouse name: Not on file   Number of children: Not on file   Years of education: Not on file   Highest education level: Not on file  Occupational History   Not on file  Tobacco Use   Smoking status: Every Day    Current packs/day: 0.50    Average packs/day: 0.5 packs/day for 45.9 years (23.0 ttl pk-yrs)    Types: Cigarettes    Start date: 59   Smokeless tobacco: Never  Vaping Use   Vaping status: Never Used  Substance and Sexual Activity   Alcohol use: No   Drug use: No   Sexual activity: Not on file  Other Topics Concern   Not on file  Social History Narrative   Not on file   Social Drivers of Health   Financial Resource Strain: Not on file  Food Insecurity: Patient Unable To Answer (07/18/2024)   Hunger Vital Sign    Worried About Running Out of Food in the Last Year: Patient unable to answer    Ran Out of Food in the Last Year: Patient unable to answer  Transportation Needs: Unmet Transportation Needs (07/18/2024)   PRAPARE - Administrator, Civil Service (Medical): Yes    Lack of Transportation (Non-Medical): Yes  Physical Activity: Not on file  Stress: Not on file  Social Connections: Patient Unable To Answer (07/18/2024)   Social Connection and Isolation Panel    Frequency of Communication  with Friends and Family: Patient unable to answer    Frequency of Social Gatherings with Friends and Family: Patient unable to answer    Attends Religious Services: Patient unable to answer    Active Member of Clubs or Organizations: Patient unable to answer    Attends Banker Meetings: Patient unable to answer    Marital Status: Patient unable  to answer   Past Medical History:  Past Medical History:  Diagnosis Date   Biventricular failure (HCC)    CVA (cerebral vascular accident) (HCC)    Polysubstance use disorder    Schizophrenia (HCC)     Past Surgical History:  Procedure Laterality Date   APPENDECTOMY     TRACHEOSTOMY TUBE PLACEMENT N/A 03/04/2024   Procedure: CREATION, TRACHEOSTOMY;  Surgeon: [name] Massie RAMAN, MD;  Location: [facility] ORS;  Service: ENT;  Laterality: N/A;    Current Medications: Current Facility-Administered Medications  Medication Dose Route Frequency Provider Last Rate Last Admin   acetaminophen  (TYLENOL ) tablet 650 mg  650 mg Oral Q6H PRN Hampton, Tracie B, NP   650 mg at 08/02/24 2228   alum & mag hydroxide-simeth (MAALOX/MYLANTA) 200-200-20 MG/5ML suspension 30 mL  30 mL Oral Q4H PRN Hampton, Tracie B, NP   30 mL at 08/01/24 1440   amiodarone  (PACERONE ) tablet 400 mg  400 mg Oral Daily [name], [name], MD   400 mg at 08/03/24 0936   ARIPiprazole  ER (ABILIFY  MAINTENA) injection 400 mg  400 mg Intramuscular Q28 days Mercie Balsley, MD   400 mg at 07/27/24 0929   feeding supplement (ENSURE PLUS HIGH PROTEIN) liquid 237 mL  237 mL Oral TID BM [name], [name], MD   237 mL at 08/03/24 1328   hydrOXYzine  (ATARAX ) tablet 25 mg  25 mg Oral TID PRN Hampton, Tracie B, NP   25 mg at 08/02/24 2100   magnesium  hydroxide (MILK OF MAGNESIA) suspension 30 mL  30 mL Oral Daily PRN Hampton, Tracie B, NP       metoprolol  tartrate (LOPRESSOR ) tablet 25 mg  25 mg Oral BID [name], [name], MD   25 mg at 08/03/24 0935   naproxen  (NAPROSYN ) tablet 250 mg  250 mg Oral BID WC Williamson Cavanah, MD   250 mg at 08/03/24 8367   OLANZapine  (ZYPREXA ) injection 5 mg  5 mg Intramuscular TID PRN Hampton, Tracie B, NP       OLANZapine  zydis (ZYPREXA ) disintegrating tablet 5 mg  5 mg Oral TID PRN Hampton, Tracie B, NP       oxyCODONE  (Oxy IR/ROXICODONE ) immediate release tablet 5 mg  5 mg Oral Q4H PRN Dorinda Homans T, MD   5 mg at 08/03/24 1547    pantoprazole  (PROTONIX ) EC tablet 40 mg  40 mg Oral Daily [name], [name], MD   40 mg at 08/03/24 0935   traZODone  (DESYREL ) tablet 50 mg  50 mg Oral QHS PRN Hampton, Tracie B, NP   50 mg at 08/02/24 2100    Lab Results:  No results found for this or any previous visit (from the past 48 hours).   Blood Alcohol level:  Lab Results  Component Value Date   [facility] <15 07/17/2024   ETH <15 02/20/2024    Metabolic Disorder Labs: Lab Results  Component Value Date   HGBA1C 5.8 (H) 07/14/2018   MPG 119.76 07/14/2018   MPG 131.24 07/13/2018   No results found for: PROLACTIN Lab Results  Component Value Date   CHOL 170 07/13/2018  TRIG 34 03/08/2024   HDL 41 07/13/2018   CHOLHDL 4.1 07/13/2018   VLDL 10 07/13/2018   LDLCALC 119 (H) 07/13/2018    Physical Findings: AIMS:  , ,  ,  ,    CIWA:    COWS:      Psychiatric Specialty Exam:  Presentation  General Appearance: Appropriate for Environment  Eye Contact:Fleeting  Speech:Normal Rate  Speech Volume:Decreased    Mood and Affect  Mood:Anxious  Affect:Flat   Thought Process  Thought Processes:circumstantial Orientation:Partial  Thought Content:discharge focused  Hallucinations:denies  Ideas of Reference:Delusions At baseline Suicidal Thoughts:denies  Homicidal Thoughts:denies   Sensorium  Memory:Immediate Poor; Recent Poor; Remote Poor  Judgment:Impaired  Insight:None   Executive Functions  Concentration:Poor  Attention Span:Poor  Recall:Poor  Fund of Knowledge:Poor  Language:Fair   Psychomotor Activity  Psychomotor Activity:No data recorded  Musculoskeletal: Strength & Muscle Tone: within normal limits Gait & Station: normal Assets  Assets:Communication Skills; Desire for Improvement; Social Support    Physical Exam: Physical Exam Vitals and nursing note reviewed.    ROS Blood pressure (!) 118/105, pulse 63, temperature 98.3 F (36.8 C), resp. rate 14, height 6' 1  (1.854 m), weight 81.2 kg, SpO2 95%. Body mass index is 23.62 kg/m.  Diagnosis: Active Problems:   Schizophrenia, paranoid (HCC)   Dementia with behavioral disturbance (HCC)   PLAN: Safety and Monitoring:  -- Involuntary admission to inpatient psychiatric unit for safety, stabilization and treatment  -- Daily contact with patient to assess and evaluate symptoms and progress in treatment  -- Patient's case to be discussed in multi-disciplinary team meeting  -- Observation Level : q15 minute checks  -- Vital signs:  q12 hours  -- Precautions: suicide, elopement, and assault -- Encouraged patient to participate in unit milieu and in scheduled group therapies  2. Psychiatric Treatment:  Scheduled Medications:  Discontinue Abilify  30 mg daily- LAY Abilify  Maintena 400 mg q4 weeks given on 07/27/24  Cogentin  1mg  one dose given for EPS -- The risks/benefits/side-effects/alternatives to this medication were discussed in detail with the patient and time was given for questions. The patient consents to medication trial.  3. Medical Issues Being Addressed:  [facility] consulted for wound care management   Assessment and Plan:   Sacral Decubitus Ulcer Full thickness decubitus ulcer in setting of recent extended hospitalization, discharged from [facility] 14[facility] Wound looks pretty good overall WOC on board we appreciate input   Cognitive Deficit Concern for anoxic brain injury at discharge 02/2024 Delirium precautions   Peripheral Vascular Disease Legs with features c/w peripheral vascular disease, diminished DP pulses Would benefit from outpatient ABI's   HFrEF Euvolemic at this time Continue home medications   Hx Atrial Flutter Not on any anticoagulation at this time EKG with NSR Continue amiodarone   Hypertension Noted, adjust meds gradually after med rec complete     4. Discharge Planning:   -- Social work and case management to assist with discharge planning and identification of  hospital follow-up needs prior to discharge  -- Estimated LOS: 3-4 days  Iviona Hole, MD 08/03/2024, 8:59 PM

## 2024-08-03 NOTE — Progress Notes (Signed)
   08/02/24 2100  Psych Admission Type (Psych Patients Only)  Admission Status Involuntary  Psychosocial Assessment  Patient Complaints Confusion  Eye Contact Fair  Facial Expression Animated  Affect Preoccupied  Speech Tangential  Interaction Assertive  Motor Activity Slow  Appearance/Hygiene Unremarkable  Behavior Characteristics Resistant to care;Irritable  Mood Preoccupied  Thought Process  Coherency Tangential  Content Blaming others;Preoccupation  Delusions Paranoid  Perception WDL  Hallucination None reported or observed  Judgment Poor  Confusion Mild  Danger to Self  Current suicidal ideation? Denies  Danger to Others  Danger to Others None reported or observed

## 2024-08-03 NOTE — Plan of Care (Signed)
  Problem: Education: Goal: Knowledge of [place] [facility] Education information/materials will improve Outcome: Progressing   Problem: Activity: Goal: Interest or engagement in activities will improve Outcome: Progressing   Problem: Coping: Goal: Ability to verbalize frustrations and anger appropriately will improve Outcome: Progressing   Problem: Education: Goal: Knowledge of [place] [facility] Education information/materials will improve Outcome: Progressing Goal: Emotional status will improve Outcome: Progressing Goal: Mental status will improve Outcome: Progressing

## 2024-08-03 NOTE — Group Note (Signed)
 Recreation Therapy Group Note   Group Topic:Other  Group Date: 08/03/2024 Start Time: 1415 End Time: 1500 Facilitators: Celestia Jeoffrey BRAVO, LRT, CTRS Location: Dayroom  Activity Description/Intervention: Therapeutic Drumming. Patients with peers and staff were given the opportunity to engage in a leader facilitated HealthRHYTH[facility] Group Empowerment Drumming Circle with staff from the [employer], in partnership with [employer]. Teaching laboratory technician and trained [place] [employer], Norleen Mon leading with LRT observing and documenting intervention and pt response. This evidenced-based practice targets 7 areas of health and wellbeing in the human experience including: stress-reduction, exercise, self-expression, camaraderie/support, nurturing, spirituality, and music-making (leisure).    Goal Area(s) Addresses:  Patient will engage in pro-social way in music group.  Patient will follow directions of drum leader on the first prompt. Patient will demonstrate no behavioral issues during group.  Patient will identify if a reduction in stress level occurs as a result of participation in therapeutic drum circle.   Affect/Mood: Appropriate   Participation Level: Moderate    Clinical Observations/Individualized Feedback: Miguel [name] was present in group. Pt was noted to be falling asleep while sitting.   Plan: Continue to engage patient in RT group sessions 2-3x/week.   Jeoffrey BRAVO Celestia, LRT, CTRS 08/03/2024 5:06 PM

## 2024-08-03 NOTE — Group Note (Signed)
 Date:  08/03/2024 Time:  9:41 PM  Group Topic/Focus:  Wrap-Up Group:   The focus of this group is to help patients review their daily goal of treatment and discuss progress on daily workbooks.    Participation Level:  Did Not Attend  Participation Quality:     Affect:     Cognitive:     Insight: None  Engagement in Group:  None  Modes of Intervention:     Additional Comments:    Miguel [name] [name] 08/03/2024, 9:41 PM

## 2024-08-03 NOTE — Plan of Care (Signed)
  Problem: Education: Goal: Knowledge of [place] [facility] Education information/materials will improve Outcome: Not Met (add Reason)   Problem: Activity: Goal: Interest or engagement in activities will improve Outcome: Not Met (add Reason)   Problem: Coping: Goal: Ability to verbalize frustrations and anger appropriately will improve Outcome: Not Met (add Reason)   Problem: Education: Goal: Knowledge of [place] [facility] Education information/materials will improve Outcome: Progressing Goal: Emotional status will improve Outcome: Progressing   Problem: Safety: Goal: Periods of time without injury will increase Outcome: Progressing

## 2024-08-03 NOTE — Group Note (Signed)
 Date:  08/03/2024 Time:  10:50 AM  Group Topic/Focus:  Goals/ Christmas Bingo  Group:   The focus of this group is to help patients establish daily goals to achieve during treatment and discuss how the patient can incorporate goal setting into their daily lives to aide in recovery. Patients also played 2 rounds of bingo and won cytogeneticist.     Participation Level:  Active  Participation Quality:  Appropriate  Affect:  Appropriate  Cognitive:  Lacking  Insight: Lacking  Engagement in Group:  Limited  Modes of Intervention:  Activity and Discussion  Additional Comments:    [name] 08/03/2024, 10:50 AM

## 2024-08-04 MED ORDER — DIVALPROEX SODIUM ER 500 MG PO TB24
500.0000 mg | ORAL_TABLET | Freq: Every day | ORAL | Status: DC
  Administered 2024-08-04 – 2024-08-06 (×3): 500 mg via ORAL
  Filled 2024-08-04 (×3): qty 1

## 2024-08-04 MED ORDER — MEMANTINE HCL 10 MG PO TABS
10.0000 mg | ORAL_TABLET | Freq: Every day | ORAL | Status: AC
  Administered 2024-08-04 – 2024-10-02 (×58): 10 mg via ORAL
  Filled 2024-08-04 (×55): qty 1

## 2024-08-04 MED ORDER — ARIPIPRAZOLE 5 MG PO TABS
10.0000 mg | ORAL_TABLET | Freq: Every day | ORAL | Status: DC
  Administered 2024-08-04 – 2024-08-16 (×12): 10 mg via ORAL
  Filled 2024-08-04 (×13): qty 2

## 2024-08-04 NOTE — Plan of Care (Signed)
  Problem: Activity: Goal: Interest or engagement in activities will improve Outcome: Progressing   Problem: Activity: Goal: Interest or engagement in activities will improve Outcome: Progressing   Problem: Coping: Goal: Ability to demonstrate self-control will improve Outcome: Progressing   Problem: Health Behavior/Discharge Planning: Goal: Compliance with treatment plan for underlying cause of condition will improve Outcome: Progressing   Problem: Safety: Goal: Periods of time without injury will increase Outcome: Progressing   Problem: Coping: Goal: Ability to adjust to condition or change in health will improve Outcome: Progressing

## 2024-08-04 NOTE — Group Note (Signed)

## 2024-08-04 NOTE — Group Note (Signed)
 Date:  08/04/2024 Time:  11:01 AM  Group Topic/Focus:  Building Self Esteem:   The Focus of this group is helping patients become aware of the effects of self-esteem on their lives, the things they and others do that enhance or undermine their self-esteem, seeing the relationship between their level of self-esteem and the choices they make and learning ways to enhance self-esteem.    Participation Level:  Minimal  Participation Quality:  Intrusive and Inattentive  Affect:  Irritable and Resistant  Cognitive:  Confused  Insight: Limited  Engagement in Group:  Off Topic  Modes of Intervention:  Activity  Additional Comments:  N/A  [name] 08/04/2024, 11:01 AM

## 2024-08-04 NOTE — Group Note (Signed)
 Recreation Therapy Group Note   Group Topic:Communication  Group Date: 08/04/2024 Start Time: 1415 End Time: 1500 Facilitators: Celestia Jeoffrey BRAVO, LRT, CTRS Location: Dayroom  Group Description: Name That Food. Pt will randomly select 1 cut paper from laminated stack of popular food images from the 1970's-1990's from LRT. Without showing the group the image they selected, pt will use descriptive words to explain what food they selected. Once the image is correctly guessed, LRT and pts will reminisce and discuss past fond memories associated with the food as well as the importance of communication.  Goal Area(s) Addressed: Patient will increase communication skills.  Patient will increase frustration tolerance skills. Patient will reminisce a fond memory in their life.     Affect/Mood: N/A   Participation Level: Did not attend    Clinical Observations/Individualized Feedback: Patient did not attend group.   Plan: Continue to engage patient in RT group sessions 2-3x/week.   Jeoffrey BRAVO Celestia, LRT, CTRS 08/04/2024 4:28 PM

## 2024-08-04 NOTE — BHH Counselor (Signed)
 CSW sent FL2 note to Romero at [facility] per her request.   Lum Croft, MSW, [facility] 08/04/2024 4:07 PM

## 2024-08-04 NOTE — NC FL2 (Signed)
 [place]  MEDICAID FL2 LEVEL OF CARE FORM     IDENTIFICATION  Patient Name: Miguel [name] Birthdate: [birth year] Sex: male Admission Date (Current Location): 07/18/2024  [place] and [place] Number:  [place] [id number] [facility] Facility and Address:  [facility], [address] [place], [place] [address]      Provider Number: [id number]  Attending Physician Name and Address:  Donnelly Mellow, MD  Relative Name and Phone Number:       Current Level of Care: Hospital Recommended Level of Care: Family Care Home Prior Approval Number:    Date Approved/Denied:   PASRR Number:    Discharge Plan: Home    Current Diagnoses: Patient Active Problem List   Diagnosis Date Noted   Schizophrenia, paranoid (HCC) 07/20/2024   Dementia with behavioral disturbance (HCC) 07/20/2024   Protein-calorie malnutrition, severe 03/03/2024   Cardiac arrest (HCC) 02/20/2024   Malnutrition of moderate degree 02/20/2024   Aspiration pneumonia of both lungs due to gastric secretions (HCC) 02/20/2024   HFrEF (heart failure with reduced ejection fraction) (HCC) 02/20/2024   LV (left ventricular) mural thrombus 02/20/2024   Elevated troponin 02/20/2024   Transaminitis 02/20/2024   Dilated cardiomyopathy (HCC) 02/20/2024   Schizoaffective disorder, bipolar type (HCC) 09/14/2022   Hypertensive emergency 09/13/2022   Bilateral pleural effusion 09/13/2022   Cocaine use disorder (HCC) 09/13/2022   Acute on chronic systolic CHF (congestive heart failure) (HCC) 09/10/2022   Acute respiratory failure with hypoxia (HCC) 09/10/2022   Stroke (HCC) 07/12/2018   Noncompliance 04/11/2016    Orientation RESPIRATION BLADDER Height & Weight     Self  Normal Continent Weight: 179 lb (81.2 kg) Height:  6' 1 (185.4 cm)  BEHAVIORAL SYMPTOMS/MOOD NEUROLOGICAL BOWEL NUTRITION STATUS      Continent    AMBULATORY STATUS COMMUNICATION OF NEEDS Skin   Independent Verbally Normal, Other  (Comment) (Wound noted on pt's sacrum, pt received wound care from nursing staff)                       Personal Care Assistance Level of Assistance              Functional Limitations Info  Speech     Speech Info: Impaired (Pt mumbles)    SPECIAL CARE FACTORS FREQUENCY  Blood pressure Blood Pressure Frequency: 2x daily in the hospotal                  Contractures Contractures Info: Not present    Additional Factors Info  Code Status, Allergies, Psychotropic Code Status Info: FULL Allergies Info: No Known Allergies Psychotropic Info: ARIPiprazole  ER (ABILIFY  MAINTENA) injection 400 mg         Current Medications (08/04/2024):  This is the current hospital active medication list Current Facility-Administered Medications  Medication Dose Route Frequency Provider Last Rate Last Admin   acetaminophen  (TYLENOL ) tablet 650 mg  650 mg Oral Q6H PRN Hampton, Tracie B, NP   650 mg at 08/02/24 2228   alum & mag hydroxide-simeth (MAALOX/MYLANTA) 200-200-20 MG/5ML suspension 30 mL  30 mL Oral Q4H PRN Hampton, Tracie B, NP   30 mL at 08/01/24 1440   amiodarone  (PACERONE ) tablet 400 mg  400 mg Oral Daily [name], [name], MD   400 mg at 08/04/24 0928   ARIPiprazole  ER (ABILIFY  MAINTENA) injection 400 mg  400 mg Intramuscular Q28 days [name], [name], MD   400 mg at 07/27/24 0929   feeding supplement (ENSURE PLUS HIGH PROTEIN) liquid 237 mL  237 mL Oral TID BM [name], [name], MD   237 mL at 08/04/24 1410   hydrOXYzine  (ATARAX ) tablet 25 mg  25 mg Oral TID PRN Hampton, Tracie B, NP   25 mg at 08/04/24 0653   magnesium  hydroxide (MILK OF MAGNESIA) suspension 30 mL  30 mL Oral Daily PRN Hampton, Tracie B, NP       metoprolol  tartrate (LOPRESSOR ) tablet 25 mg  25 mg Oral BID [name], [name], MD   25 mg at 08/04/24 9070   naproxen  (NAPROSYN ) tablet 250 mg  250 mg Oral BID WC [name], [name], MD   250 mg at 08/04/24 0759   OLANZapine  (ZYPREXA ) injection 5 mg  5 mg Intramuscular TID  PRN Hampton, Tracie B, NP       OLANZapine  zydis (ZYPREXA ) disintegrating tablet 5 mg  5 mg Oral TID PRN Hampton, Tracie B, NP   5 mg at 08/04/24 1129   oxyCODONE  (Oxy IR/ROXICODONE ) immediate release tablet 5 mg  5 mg Oral Q4H PRN [name], [name], MD   5 mg at 08/03/24 1547   pantoprazole  (PROTONIX ) EC tablet 40 mg  40 mg Oral Daily [name], [name], MD   40 mg at 08/04/24 9070   traZODone  (DESYREL ) tablet 50 mg  50 mg Oral QHS PRN Hampton, Tracie B, NP   50 mg at 08/02/24 2100     Discharge Medications: Please see discharge summary for a list of discharge medications.  Relevant Imaging Results:  Relevant Lab Results:   Additional Information SSN [id number]  [name], LCSWA

## 2024-08-04 NOTE — Group Note (Signed)
 Date:  08/04/2024 Time:  9:22 PM  Group Topic/Focus:  Wrap-Up Group:   The focus of this group is to help patients review their daily goal of treatment and discuss progress on daily workbooks.    Participation Level:  Did Not Attend  Participation Quality:     Affect:     Cognitive:     Insight: None  Engagement in Group:  None  Modes of Intervention:     Additional Comments:    [name] 08/04/2024, 9:22 PM

## 2024-08-04 NOTE — BHH Counselor (Signed)
 CSW received call from pt's sister, [name].   [name] apologized for her brothers disposition in earlier conversation.   [name] reports that when pt is ready for discharge, [facility] tries to get the hospital to hold pt.   [name] reports she has been trying to have the conversation with [facility] about placing the pt for months.   Reports that this is how pt ends up on the streets, reports that the hospitals try and discharge pt back to [place] and that [facility] refuses to accept that pt needs permanent placement.   CSW informed provider of conversation.   Lum Croft, MSW, [place] 08/04/2024 11:29 AM

## 2024-08-04 NOTE — BHH Counselor (Signed)
 CSW contacted pt's brother Navin Dogan, [phone]) and pt's sister Thadius Smisek, [phone]) with provider to discuss discharge planning.   CSW and provider informed family that pt was ready for discharge, psychiatrically cleared and not meeting IVC criteria.   Pt's brother Cherene reported that pt needs to be placed in a facility.   CSW and provider informed pt's brother that pt needs to be discharged as pt no longer meets IVC criteria and cannot be held against his will. CSW and provider informed pt's family that an APS report was made so that they can pursue guardianship and placement in the community.   CSW and provider informed pt's family that pt would be discharged Friday.   Cherene reports that he will begin looking for placement for pt prior to Friday.   CSW contacted pt's ACT Team Lead Romero to see if there are any supports that can be provided to the family upon discharge.   Romero reports that she needs an FL2 from the hospital. CSW will provide FL2.   CSW informed Romero that pt would be discharged FRIDAY.   Clarita reports she needs to call CSW back to discuss the plan for discharge and scheduling follow-up with [facility] further.   CSW awaits call at this time.   Lum Croft, MSW, [place] 08/04/2024 11:03 AM

## 2024-08-04 NOTE — Progress Notes (Signed)
   08/04/24 0100  Psych Admission Type (Psych Patients Only)  Admission Status Involuntary  Psychosocial Assessment  Eye Contact Fair  Facial Expression Animated  Affect Preoccupied  Speech Tangential  Interaction Assertive  Motor Activity Slow  Appearance/Hygiene Unremarkable  Behavior Characteristics Cooperative  Mood Preoccupied  Thought Process  Coherency Tangential  Content Preoccupation  Delusions Paranoid  Perception WDL  Hallucination None reported or observed (Reported that sometimes people come down out of the 3 openings in his room.)  Judgment Poor  Confusion WDL  Danger to Self  Current suicidal ideation? Denies  Danger to Others  Danger to Others None reported or observed

## 2024-08-04 NOTE — Plan of Care (Signed)
   Problem: Education: Goal: Mental status will improve Outcome: Not Progressing

## 2024-08-04 NOTE — Group Note (Signed)
 Date:  08/04/2024 Time:  4:23 PM  Group Topic/Focus:  Overcoming Stress:   The focus of this group is to define stress and help patients assess their triggers.    Participation Level:  Did Not Attend   Miguel [name] 08/04/2024, 4:23 PM

## 2024-08-05 DIAGNOSIS — F200 Paranoid schizophrenia: Secondary | ICD-10-CM | POA: Diagnosis not present

## 2024-08-05 DIAGNOSIS — F03918 Unspecified dementia, unspecified severity, with other behavioral disturbance: Secondary | ICD-10-CM | POA: Diagnosis not present

## 2024-08-05 NOTE — Progress Notes (Signed)
 Patient is an involuntary admission to [facility] for schizophrenia with paranoia.  Patient was less argumentative today.  He still ask numerous times about medications and his ensure but did not become angry today.  Patient denies SI, HI, AVH, anxiety and depression.  His stage 3 decubitus ulcer was cleansed with vashe per wound care orders.  Patient had taken the previous dressing off.  Took his medications with no problems. Will continue to monitor.

## 2024-08-05 NOTE — Group Note (Signed)
 Date:  08/05/2024 Time:  10:27 AM  Group Topic/Focus:  Morning Stretch with Norleen, Movement therapy    Participation Level:  Did Not Attend   [name] 08/05/2024, 10:27 AM

## 2024-08-05 NOTE — BHH Counselor (Signed)
 CSW facilitated meeting between [employer] nurse Rick) and pt so that pt can sign paperwork to receive services again from [facility], MSW, [facility] 08/05/2024 1:40 PM

## 2024-08-05 NOTE — Progress Notes (Signed)
 [facility] MD Progress Note  08/05/2024 3:23 PM VINICIO LYNK  MRN:  [id number]  Peter Keyworth. Miguel [name] is a 73 year old male with a past psychiatric history significant for schizophrenia who presents under involuntary commitment initiated by family due to medication non-adherence, increasing paranoia, and behavioral disorganization. 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 Brother reports multiple episodes of patient whispering through the night, stating people were talking about him and planning to harm him. Patient was advised by his ACT Team to come to the ED; he refused, prompting the family to pursue IVC.Patient is admitted to [facility] unit with Q15 min safety monitoring. Multidisciplinary team approach is offered. Medication management; group/milieu therapy is offered.  Subjective:  Chart reviewed, case discussed in multidisciplinary meeting, patient seen during rounds.   Patient is noted to be sitting in the day area.  He expressed a lot of complaints about the staff and about his wound.  He informed the provider that somebody came and did the dressing on the wound that he reportedly came off when he went to the bathroom.  He remains confused about his ongoing medical problems and the need for treatment.  He is taking the medications from the nursing staff but forgets about them and argues with the nursing to get his medication.  He is not endorsing SI/HI/plan. Past Psychiatric History: see h&P Family History:  Family History  Family history unknown: Yes   Social History:  Social  History   Substance and Sexual Activity  Alcohol Use No     Social History   Substance and Sexual Activity  Drug Use No    Social History   Socioeconomic History   Marital status: Divorced    Spouse name: Not on file   Number of children: Not on file   Years of education: Not on file   Highest education level: Not on file  Occupational History   Not on file  Tobacco Use   Smoking status: Every Day    Current packs/day: 0.50    Average packs/day: 0.5 packs/day for 45.9 years (23.0 ttl pk-yrs)    Types: Cigarettes    Start date: 5   Smokeless tobacco: Never  Vaping Use   Vaping status: Never Used  Substance and Sexual Activity   Alcohol use: No   Drug use: No   Sexual activity: Not on file  Other Topics Concern   Not on file  Social History Narrative   Not on file   Social Drivers of Health   Financial Resource Strain: Not on file  Food Insecurity: Patient Unable To Answer (07/18/2024)   Hunger Vital Sign    Worried About Running Out of Food in the Last Year: Patient unable to answer    Ran Out of Food in the Last Year: Patient unable to answer  Transportation Needs: Unmet Transportation Needs (07/18/2024)   PRAPARE - Administrator, Civil Service (Medical): Yes    Lack of Transportation (Non-Medical): Yes  Physical Activity: Not on file  Stress: Not on file  Social Connections: Patient Unable To Answer (07/18/2024)   Social Connection and Isolation Panel    Frequency of Communication with Friends and Family: Patient unable to  answer    Frequency of Social Gatherings with Friends and Family: Patient unable to answer    Attends Religious Services: Patient unable to answer    Active Member of Clubs or Organizations: Patient unable to answer    Attends Banker Meetings: Patient unable to answer    Marital Status: Patient unable to answer   Past Medical History:  Past Medical History:  Diagnosis Date   Biventricular failure (HCC)     CVA (cerebral vascular accident) (HCC)    Polysubstance use disorder    Schizophrenia (HCC)     Past Surgical History:  Procedure Laterality Date   APPENDECTOMY     TRACHEOSTOMY TUBE PLACEMENT N/A 03/04/2024   Procedure: CREATION, TRACHEOSTOMY;  Surgeon: [name] Massie RAMAN, MD;  Location: [facility] ORS;  Service: ENT;  Laterality: N/A;    Current Medications: Current Facility-Administered Medications  Medication Dose Route Frequency Provider Last Rate Last Admin   acetaminophen  (TYLENOL ) tablet 650 mg  650 mg Oral Q6H PRN Hampton, Tracie B, NP   650 mg at 08/04/24 2147   alum & mag hydroxide-simeth (MAALOX/MYLANTA) 200-200-20 MG/5ML suspension 30 mL  30 mL Oral Q4H PRN Hampton, Tracie B, NP   30 mL at 08/05/24 0306   amiodarone  (PACERONE ) tablet 400 mg  400 mg Oral Daily [name], [name], MD   400 mg at 08/05/24 0848   ARIPiprazole  (ABILIFY ) tablet 10 mg  10 mg Oral Daily Edon Hoadley, MD   10 mg at 08/05/24 0847   ARIPiprazole  ER (ABILIFY  MAINTENA) injection 400 mg  400 mg Intramuscular Q28 days Barett Whidbee, MD   400 mg at 07/27/24 9070   divalproex  (DEPAKOTE  ER) 24 hr tablet 500 mg  500 mg Oral QHS Autymn Omlor, MD   500 mg at 08/04/24 2203   feeding supplement (ENSURE PLUS HIGH PROTEIN) liquid 237 mL  237 mL Oral TID BM [name], [name], MD   237 mL at 08/05/24 1344   hydrOXYzine  (ATARAX ) tablet 25 mg  25 mg Oral TID PRN Hampton, Tracie B, NP   25 mg at 08/04/24 9346   magnesium  hydroxide (MILK OF MAGNESIA) suspension 30 mL  30 mL Oral Daily PRN Hampton, Tracie B, NP       memantine  (NAMENDA ) tablet 10 mg  10 mg Oral Daily Kelyn Koskela, MD   10 mg at 08/05/24 9152   metoprolol  tartrate (LOPRESSOR ) tablet 25 mg  25 mg Oral BID [name], [name], MD   25 mg at 08/05/24 0847   naproxen  (NAPROSYN ) tablet 250 mg  250 mg Oral BID WC Temiloluwa Laredo, MD   250 mg at 08/05/24 0847   OLANZapine  (ZYPREXA ) injection 5 mg  5 mg Intramuscular TID PRN Hampton, Tracie B, NP       OLANZapine  zydis  (ZYPREXA ) disintegrating tablet 5 mg  5 mg Oral TID PRN Hampton, Tracie B, NP   5 mg at 08/04/24 1129   oxyCODONE  (Oxy IR/ROXICODONE ) immediate release tablet 5 mg  5 mg Oral Q4H PRN Dorinda Homans T, MD   5 mg at 08/03/24 1547   pantoprazole  (PROTONIX ) EC tablet 40 mg  40 mg Oral Daily [name], [name], MD   40 mg at 08/05/24 0847   traZODone  (DESYREL ) tablet 50 mg  50 mg Oral QHS PRN Hampton, Tracie B, NP   50 mg at 08/04/24 2049    Lab Results:  No results found for this or any previous visit (from the past 48 hours).   Blood Alcohol level:  Lab Results  Component Value Date   [facility] <15 07/17/2024   ETH <15 02/20/2024    Metabolic Disorder Labs: Lab Results  Component Value Date   HGBA1C 5.8 (H) 07/14/2018   MPG 119.76 07/14/2018   MPG 131.24 07/13/2018   No results found for: PROLACTIN Lab Results  Component Value Date   CHOL 170 07/13/2018   TRIG 34 03/08/2024   HDL 41 07/13/2018   CHOLHDL 4.1 07/13/2018   VLDL 10 07/13/2018   LDLCALC 119 (H) 07/13/2018    Physical Findings: AIMS:  , ,  ,  ,    CIWA:    COWS:      Psychiatric Specialty Exam:  Presentation  General Appearance: Appropriate for Environment  Eye Contact:Fleeting  Speech:Normal Rate  Speech Volume:Decreased    Mood and Affect  Mood:Anxious  Affect:Flat   Thought Process  Thought Processes:circumstantial Orientation:Partial  Thought Content:discharge focused  Hallucinations:denies  Ideas of Reference:Delusions At baseline Suicidal Thoughts:denies  Homicidal Thoughts:denies   Sensorium  Memory:Immediate Poor; Recent Poor; Remote Poor  Judgment:Impaired  Insight:None   Executive Functions  Concentration:Poor  Attention Span:Poor  Recall:Poor  Fund of Knowledge:Poor  Language:Fair   Psychomotor Activity  Psychomotor Activity:No data recorded  Musculoskeletal: Strength & Muscle Tone: within normal limits Gait & Station: normal Assets  Assets:Communication  Skills; Desire for Improvement; Social Support    Physical Exam: Physical Exam Vitals and nursing note reviewed.    ROS Blood pressure 135/82, pulse 71, temperature 97.6 F (36.4 C), resp. rate 18, height 6' 1 (1.854 m), weight 81.2 kg, SpO2 96%. Body mass index is 23.62 kg/m.  Diagnosis: Active Problems:   Schizophrenia, paranoid (HCC)   Dementia with behavioral disturbance (HCC)   PLAN: Safety and Monitoring:  -- Involuntary admission to inpatient psychiatric unit for safety, stabilization and treatment  -- Daily contact with patient to assess and evaluate symptoms and progress in treatment  -- Patient's case to be discussed in multi-disciplinary team meeting  -- Observation Level : q15 minute checks  -- Vital signs:  q12 hours  -- Precautions: suicide, elopement, and assault -- Encouraged patient to participate in unit milieu and in scheduled group therapies  2. Psychiatric Treatment:  Scheduled Medications:  Discontinue Abilify  30 mg daily- LAY Abilify  Maintena 400 mg q4 weeks given on 07/27/24  Cogentin  1mg  one dose given for EPS -- The risks/benefits/side-effects/alternatives to this medication were discussed in detail with the patient and time was given for questions. The patient consents to medication trial.  3. Medical Issues Being Addressed:  [facility] consulted for wound care management   Assessment and Plan:   Sacral Decubitus Ulcer Full thickness decubitus ulcer in setting of recent extended hospitalization, discharged from [facility] 14[facility] Wound looks pretty good overall WOC on board we appreciate input   Cognitive Deficit Concern for anoxic brain injury at discharge 02/2024 Delirium precautions   Peripheral Vascular Disease Legs with features c/w peripheral vascular disease, diminished DP pulses Would benefit from outpatient ABI's   HFrEF Euvolemic at this time Continue home medications   Hx Atrial Flutter Not on any anticoagulation at this time EKG with  NSR Continue amiodarone   Hypertension Noted, adjust meds gradually after med rec complete     4. Discharge Planning:   -- Social work and case management to assist with discharge planning and identification of hospital follow-up needs prior to discharge  -- Estimated LOS: 3-4 days  Allyn Foil, MD 08/05/2024, 3:23 PM

## 2024-08-05 NOTE — Progress Notes (Signed)
°   08/04/24 2000  Psych Admission Type (Psych Patients Only)  Admission Status Involuntary  Psychosocial Assessment  Patient Complaints None  Eye Contact Fair  Facial Expression Animated  Affect Preoccupied  Speech Tangential  Interaction Assertive  Motor Activity Slow  Appearance/Hygiene Unremarkable  Behavior Characteristics Cooperative  Mood Preoccupied  Thought Process  Coherency Tangential  Content Preoccupation  Delusions Paranoid  Perception WDL  Hallucination None reported or observed  Judgment Poor  Confusion WDL  Danger to Self  Current suicidal ideation? Denies  Agreement Not to Harm Self Yes  Description of Agreement verbal  Danger to Others  Danger to Others None reported or observed

## 2024-08-05 NOTE — Group Note (Signed)
 Date:  08/05/2024 Time:  9:23 PM  Group Topic/Focus:  Self Care:   The focus of this group is to help patients understand the importance of self-care in order to improve or restore emotional, physical, spiritual, interpersonal, and financial health.    Participation Level:  Active  Participation Quality:  Appropriate  Affect:  Appropriate  Cognitive:  Appropriate  Insight: Appropriate  Engagement in Group:  Engaged  Modes of Intervention:  Education  Additional Comments:    Laymon ONEIDA Finder 08/05/2024, 9:23 PM

## 2024-08-06 DIAGNOSIS — F200 Paranoid schizophrenia: Secondary | ICD-10-CM | POA: Diagnosis not present

## 2024-08-06 DIAGNOSIS — F03918 Unspecified dementia, unspecified severity, with other behavioral disturbance: Secondary | ICD-10-CM | POA: Diagnosis not present

## 2024-08-06 NOTE — BH IP Treatment Plan (Signed)
 Interdisciplinary Treatment and Diagnostic Plan Update  08/06/2024 Time of Session: 12:30 PM  Miguel [name] MRN: [id number]  Principal Diagnosis: <principal problem not specified>  Secondary Diagnoses: Active Problems:   Schizophrenia, paranoid (HCC)   Dementia with behavioral disturbance (HCC)   Current Medications:  Current Facility-Administered Medications  Medication Dose Route Frequency Provider Last Rate Last Admin   acetaminophen  (TYLENOL ) tablet 650 mg  650 mg Oral Q6H PRN Hampton, Tracie B, NP   650 mg at 08/04/24 2147   alum & mag hydroxide-simeth (MAALOX/MYLANTA) 200-200-20 MG/5ML suspension 30 mL  30 mL Oral Q4H PRN Hampton, Tracie B, NP   30 mL at 08/05/24 0306   amiodarone  (PACERONE ) tablet 400 mg  400 mg Oral Daily [name], [name], MD   400 mg at 08/06/24 9195   ARIPiprazole  (ABILIFY ) tablet 10 mg  10 mg Oral Daily [name], [name], MD   10 mg at 08/06/24 0804   ARIPiprazole  ER (ABILIFY  MAINTENA) injection 400 mg  400 mg Intramuscular Q28 days [name], [name], MD   400 mg at 07/27/24 9070   divalproex  (DEPAKOTE  ER) 24 hr tablet 500 mg  500 mg Oral QHS [name], [name], MD   500 mg at 08/05/24 2117   feeding supplement (ENSURE PLUS HIGH PROTEIN) liquid 237 mL  237 mL Oral TID BM [name], [name], MD   237 mL at 08/06/24 1340   hydrOXYzine  (ATARAX ) tablet 25 mg  25 mg Oral TID PRN Hampton, Tracie B, NP   25 mg at 08/06/24 0633   magnesium  hydroxide (MILK OF MAGNESIA) suspension 30 mL  30 mL Oral Daily PRN Hampton, Tracie B, NP   30 mL at 08/06/24 9366   memantine  (NAMENDA ) tablet 10 mg  10 mg Oral Daily [name], [name], MD   10 mg at 08/06/24 0805   metoprolol  tartrate (LOPRESSOR ) tablet 25 mg  25 mg Oral BID [name], [name], MD   25 mg at 08/06/24 0804   naproxen  (NAPROSYN ) tablet 250 mg  250 mg Oral BID WC [name], [name], MD   250 mg at 08/06/24 0805   OLANZapine  (ZYPREXA ) injection 5 mg  5 mg Intramuscular TID PRN Hampton, Tracie B, NP       OLANZapine  zydis (ZYPREXA )  disintegrating tablet 5 mg  5 mg Oral TID PRN Hampton, Tracie B, NP   5 mg at 08/04/24 1129   oxyCODONE  (Oxy IR/ROXICODONE ) immediate release tablet 5 mg  5 mg Oral Q4H PRN [name], [name], MD   5 mg at 08/05/24 2117   pantoprazole  (PROTONIX ) EC tablet 40 mg  40 mg Oral Daily [name], [name], MD   40 mg at 08/06/24 0805   traZODone  (DESYREL ) tablet 50 mg  50 mg Oral QHS PRN Hampton, Tracie B, NP   50 mg at 08/05/24 2117   PTA Medications: Medications Prior to Admission  Medication Sig Dispense Refill Last Dose/Taking   amiodarone  (PACERONE ) 400 MG tablet Place 1 tablet (400 mg total) into feeding tube daily.      ARIPiprazole  (ABILIFY ) 30 MG tablet Place 1 tablet (30 mg total) into feeding tube at bedtime.      bisacodyl  (DULCOLAX) 10 MG suppository Place 1 suppository (10 mg total) rectally daily as needed for moderate constipation or severe constipation.      busPIRone  (BUSPAR ) 5 MG tablet Take 5 mg by mouth 3 (three) times daily.      Chlorhexidine  Gluconate Cloth 2 % PADS Apply 6 each topically daily.      digoxin  (  LANOXIN ) 0.125 MG tablet 1 tablet (0.125 mg total) by Per NG tube route daily. (Patient taking differently: Take 0.125 mg by mouth every other day.)      docusate (COLACE) 50 MG/5ML liquid Place 10 mLs (100 mg total) into feeding tube 2 (two) times daily.      metoprolol  tartrate (LOPRESSOR ) 25 MG tablet Take 25 mg by mouth 2 (two) times daily.      pantoprazole  (PROTONIX ) 40 MG tablet Take 40 mg by mouth daily.      traMADol  (ULTRAM ) 50 MG tablet Take 50 mg by mouth 2 (two) times daily.       Patient Stressors: Educational concerns   Health problems   Medication change or noncompliance   Other: Stated transitional housing concerns    Patient Strengths: Manufacturing systems engineer  Religious Affiliation   Treatment Modalities: Medication Management, Group therapy, Case management,  1 to 1 session with clinician, Psychoeducation, Recreational therapy.   Physician Treatment Plan  for Primary Diagnosis: <principal problem not specified> Long Term Goal(s):     Short Term Goals:    Medication Management: Evaluate patient's response, side effects, and tolerance of medication regimen.  Therapeutic Interventions: 1 to 1 sessions, Unit Group sessions and Medication administration.  Evaluation of Outcomes: Not Progressing  Physician Treatment Plan for Secondary Diagnosis: Active Problems:   Schizophrenia, paranoid (HCC)   Dementia with behavioral disturbance (HCC)  Long Term Goal(s):     Short Term Goals:       Medication Management: Evaluate patient's response, side effects, and tolerance of medication regimen.  Therapeutic Interventions: 1 to 1 sessions, Unit Group sessions and Medication administration.  Evaluation of Outcomes: Not Progressing   RN Treatment Plan for Primary Diagnosis: <principal problem not specified> Long Term Goal(s): Knowledge of disease and therapeutic regimen to maintain health will improve  Short Term Goals: Ability to remain free from injury will improve, Ability to verbalize frustration and anger appropriately will improve, Ability to demonstrate self-control, Ability to participate in decision making will improve, Ability to verbalize feelings will improve, Ability to disclose and discuss suicidal ideas, Ability to identify and develop effective coping behaviors will improve, and Compliance with prescribed medications will improve  Medication Management: RN will administer medications as ordered by provider, will assess and evaluate patient's response and provide education to patient for prescribed medication. RN will report any adverse and/or side effects to prescribing provider.  Therapeutic Interventions: 1 on 1 counseling sessions, Psychoeducation, Medication administration, Evaluate responses to treatment, Monitor vital signs and CBGs as ordered, Perform/monitor CIWA, COWS, AIMS and Fall Risk screenings as ordered, Perform wound  care treatments as ordered.  Evaluation of Outcomes: Not Progressing   LCSW Treatment Plan for Primary Diagnosis: <principal problem not specified> Long Term Goal(s): Safe transition to appropriate next level of care at discharge, Engage patient in therapeutic group addressing interpersonal concerns.  Short Term Goals: Engage patient in aftercare planning with referrals and resources, Increase social support, Increase ability to appropriately verbalize feelings, Increase emotional regulation, Facilitate acceptance of mental health diagnosis and concerns, Facilitate patient progression through stages of change regarding substance use diagnoses and concerns, Identify triggers associated with mental health/substance abuse issues, and Increase skills for wellness and recovery  Therapeutic Interventions: Assess for all discharge needs, 1 to 1 time with Social worker, Explore available resources and support systems, Assess for adequacy in community support network, Educate family and significant other(s) on suicide prevention, Complete Psychosocial Assessment, Interpersonal group therapy.  Evaluation of Outcomes: Not Progressing  Progress in Treatment: Attending groups: Yes. and No. Participating in groups: Yes. and No. Taking medication as prescribed: Yes. Toleration medication: Yes. Family/Significant other contact made: No, will contact:  CSW to contact once permission is granted.  Patient understands diagnosis: Yes. Discussing patient identified problems/goals with staff: Yes. Medical problems stabilized or resolved: Yes. Denies suicidal/homicidal ideation: Yes. Issues/concerns per patient self-inventory: No. Other: None   New problem(s) identified: No, Describe:  None Update 07/26/24: No changes at this time Update 08/01/24: No changes at this time  Update 08/06/24: No changes at this time   New Short Term/Long Term Goal(s):detox, elimination of symptoms of psychosis, medication  management for mood stabilization; elimination of SI thoughts; development of comprehensive mental wellness/sobriety plan.  Update 07/26/24: No changes at this time Update 08/01/24: No changes at this time Update 08/06/24: No changes at this time     Patient Goals:  Make sure everything is settled. Update 07/26/24: No changes at this time Update 08/01/24: No changes at this time Update 08/06/24: No changes at this time Update 08/06/24: No changes at this time       Discharge Plan or Barriers: CSW to assist with the development of appropriate discharge plan.  Update 07/26/24: No changes at this time Update 08/01/24: APS Report made for pt, screened it. Update 08/06/24: FL2 sent to [facility]. Pt is not placement. [facility] and APS to assist with placement      Reason for Continuation of Hospitalization: Aggression Anxiety Delusions  Depression Hallucinations Suicidal ideation   Estimated Length of Stay: 1-7 days. Update 07/26/24: TBD Update 08/01/24: TBD Update 08/06/24: TBD    Last 3 [place] Suicide Severity Risk Score: Flowsheet Row Admission (Current) from 07/18/2024 in [facility] ED from 07/17/2024 in [facility] Department at [facility] ED to Hosp-Admission (Discharged) from 02/20/2024 in [facility] ICU/CCU  C-SSRS RISK CATEGORY No Risk No Risk No Risk    Last PHQ 2/9 Scores:     No data to display          Scribe for Treatment Team: [name], [place] 08/06/2024 3:45 PM

## 2024-08-06 NOTE — Group Note (Signed)
 LCSW Group Therapy Note  Group Date: 08/06/2024 Start Time: 1330 End Time: 1400   Type of Therapy and Topic:  Group Therapy - Healthy vs Unhealthy Coping Skills  Participation Level:  Minimal   Description of Group The focus of this group was to determine what unhealthy coping techniques typically are used by group members and what healthy coping techniques would be helpful in coping with various problems. Patients were guided in becoming aware of the differences between healthy and unhealthy coping techniques. Patients were asked to identify 2-3 healthy coping skills they would like to learn to use more effectively.  Therapeutic Goals Patients learned that coping is what human beings do all day long to deal with various situations in their lives Patients defined and discussed healthy vs unhealthy coping techniques Patients identified their preferred coping techniques and identified whether these were healthy or unhealthy Patients determined 2-3 healthy coping skills they would like to become more familiar with and use more often. Patients provided support and ideas to each other   Summary of Patient Progress:  During group, Nancyann  participated but much of what was said was not on topic. Patient demonstrated some insight into the subject matter, was respectful of peers, and participated throughout the entire session.   Therapeutic Modalities Cognitive Behavioral Therapy Motivational Interviewing  [name], LCSWA 08/06/2024  2:13 PM

## 2024-08-06 NOTE — Progress Notes (Signed)
 Behavior:  Pleasant and cooperative.   Psych assessment: Denies SI/HI and AVH.    Interaction / Group attendance:  Present in the milieu.  Appropriate interaction with peers and staff.  Medication/ PRNs: Compliant.   Pain: 6/10 in sacrum.   15 min checks in place for safety.

## 2024-08-06 NOTE — Group Note (Signed)
 Date:  08/06/2024 Time:  3:58 PM  Group Topic/Focus:     Music for gero psych (geriatric psychology/dementia care) focuses on familiar, personalized music from a person's memory bump to reduce agitation, improve mood, spark memories, and enhance communication, with genres like soft classical, jazz, hymns, and old favorites (e.g., [place]ing in [place], [place], big band) being effective, alongside tailored music therapy techniques that match tempo and use simple melodies for calming effects.   Participation Level:  Active  Participation Quality:  Appropriate  Affect:  Appropriate  Cognitive:  Appropriate  Insight: Appropriate  Engagement in Group:  Engaged  Modes of Intervention:  Socialization  Additional Comments:  N/A  Miguel [name] Gavel 08/06/2024, 3:58 PM

## 2024-08-06 NOTE — Progress Notes (Signed)
 Medications given early per patient request to encourage compliance.

## 2024-08-06 NOTE — Group Note (Signed)
 Date:  08/06/2024 Time:  10:57 AM  Group Topic/Focus:  Self Care:   The focus of this group is to help patients understand the importance of self-care in order to improve or restore emotional, physical, spiritual, interpersonal, and financial health. We did chair yoga to stretch our body's and get moving.    Participation Level:  None  Participation Quality:  none  Affect:  Flat  Cognitive:  Lacking  Insight: None  Engagement in Group:  None  Modes of Intervention:  Activity  Additional Comments:  sleeping and refused to go to room  Leigh VEAR Pais 08/06/2024, 10:57 AM

## 2024-08-06 NOTE — Group Note (Signed)
 Recreation Therapy Group Note   Group Topic:Leisure Education  Group Date: 08/06/2024 Start Time: 1400 End Time: 1440 Facilitators: Celestia Jeoffrey BRAVO, LRT, CTRS Location: Courtyard  Group Description: Music. Patients are encouraged to name their favorite song(s) for LRT to play song through speaker for group to hear, while in the courtyard getting fresh air and sunlight. Patients educated on the definition of leisure and the importance of having different leisure interests outside of the hospital. Group discussed how leisure activities can often be used as pharmacologist and that listening to music and being outside are examples.    Goal Area(s) Addressed:  Patient will identify a current leisure interest.  Patient will practice making a positive decision. Patient will have the opportunity to try a new leisure activity.   Affect/Mood: Appropriate and Full range   Participation Level: Moderate   Participation Quality: Independent   Behavior: Cooperative   Speech/Thought Process: Disorganized   Insight: Limited   Judgement: Limited   Modes of Intervention: Education, Guided Discussion, and Music   Patient Response to Interventions:  Receptive   Education Outcome:  In group clarification offered    Clinical Observations/Individualized Feedback: Miguel [name] was active in their participation of session activities and group discussion. Pt interacted well with LRT and peers duration of session.    Plan: Continue to engage patient in RT group sessions 2-3x/week.   Jeoffrey BRAVO Celestia, LRT, CTRS 08/06/2024 4:34 PM

## 2024-08-06 NOTE — [facility] Counselor (Deleted)
 Pt was visited by possible placement and APS social worker Arch Ada.   Arch reports she will reach out to pt should she need any notes for the placement.   Lum Croft, MSW, [place] 08/06/2024 10:39 AM

## 2024-08-06 NOTE — Plan of Care (Signed)
  Problem: Coping: Goal: Ability to verbalize frustrations and anger appropriately will improve Outcome: Progressing   Problem: Activity: Goal: Interest or engagement in activities will improve Outcome: Progressing

## 2024-08-06 NOTE — Progress Notes (Signed)
 [facility] MD Progress Note  08/06/2024 1:29 PM LANSON RANDLE  MRN:  [id number]  Huntington Leverich. Allum is a 73 year old male with a past psychiatric history significant for schizophrenia who presents under involuntary commitment initiated by family due to medication non-adherence, increasing paranoia, and behavioral disorganization. 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 Brother reports multiple episodes of patient whispering through the night, stating people were talking about him and planning to harm him. Patient was advised by his ACT Team to come to the ED; he refused, prompting the family to pursue IVC.Patient is admitted to [facility] unit with Q15 min safety monitoring. Multidisciplinary team approach is offered. Medication management; group/milieu therapy is offered.  Subjective:  Chart reviewed, case discussed in multidisciplinary meeting, patient seen during rounds.   Patient is noted to be walking in the hallway.  He is calm since his medications were adjusted.  Will continue to adjust the dosage for Depakote .  He remains confused and talks about staff members trying to get his wound dressing removed.  He is unable to comprehend the staff members working with him including the wound care.  He denies SI/HI/plan and denies hallucinations.  He remains irritable towards the nurses as he forgets he received his medication and keeps approaching them demanding medication. Past Psychiatric History: see h&P Family History:  Family History  Family history unknown: Yes   Social History:   Social History   Substance and Sexual Activity  Alcohol Use No     Social History   Substance and Sexual Activity  Drug Use No    Social History   Socioeconomic History   Marital status: Divorced    Spouse name: Not on file   Number of children: Not on file   Years of education: Not on file   Highest education level: Not on file  Occupational History   Not on file  Tobacco Use   Smoking status: Every Day    Current packs/day: 0.50    Average packs/day: 0.5 packs/day for 45.9 years (23.0 ttl pk-yrs)    Types: Cigarettes    Start date: 89   Smokeless tobacco: Never  Vaping Use   Vaping status: Never Used  Substance and Sexual Activity   Alcohol use: No   Drug use: No   Sexual activity: Not on file  Other Topics Concern   Not on file  Social History Narrative   Not on file   Social Drivers of Health   Tobacco Use: High Risk (07/18/2024)   Patient History    Smoking Tobacco Use: Every Day    Smokeless Tobacco Use: Never    Passive Exposure: Not on file  Financial Resource Strain: Not on file  Food Insecurity: Patient Unable To Answer (07/18/2024)   Epic    Worried About Programme Researcher, Broadcasting/film/video in the Last Year: Patient unable to answer    Ran Out of Food in the Last Year: Patient unable to answer  Transportation Needs: Unmet Transportation Needs (07/18/2024)   Epic    Lack of Transportation (Medical): Yes    Lack of Transportation (Non-Medical): Yes  Physical Activity: Not on file  Stress: Not on file  Social Connections: Patient Unable To  Answer (07/18/2024)   Social Connection and Isolation Panel    Frequency of Communication with Friends and Family: Patient unable to answer    Frequency of Social Gatherings with Friends and Family: Patient unable to answer    Attends Religious Services: Patient unable to answer    Active Member of Clubs or Organizations: Patient unable to answer    Attends Banker Meetings: Patient unable to answer    Marital  Status: Patient unable to answer  Depression (PHQ2-9): Not on file  Alcohol Screen: Low Risk (07/18/2024)   Alcohol Screen    Last Alcohol Screening Score (AUDIT): 0  Housing: Unknown (07/18/2024)   Epic    Unable to Pay for Housing in the Last Year: Patient unable to answer    Number of Times Moved in the Last Year: 0    Homeless in the Last Year: Patient declined  Utilities: Patient Unable To Answer (07/18/2024)   Epic    Threatened with loss of utilities: Patient unable to answer  Health Literacy: Not on file   Past Medical History:  Past Medical History:  Diagnosis Date   Biventricular failure (HCC)    CVA (cerebral vascular accident) (HCC)    Polysubstance use disorder    Schizophrenia (HCC)     Past Surgical History:  Procedure Laterality Date   APPENDECTOMY     TRACHEOSTOMY TUBE PLACEMENT N/A 03/04/2024   Procedure: CREATION, TRACHEOSTOMY;  Surgeon: [name] Massie RAMAN, MD;  Location: [facility] ORS;  Service: ENT;  Laterality: N/A;    Current Medications: Current Facility-Administered Medications  Medication Dose Route Frequency Provider Last Rate Last Admin   acetaminophen  (TYLENOL ) tablet 650 mg  650 mg Oral Q6H PRN Hampton, Tracie B, NP   650 mg at 08/04/24 2147   alum & mag hydroxide-simeth (MAALOX/MYLANTA) 200-200-20 MG/5ML suspension 30 mL  30 mL Oral Q4H PRN Hampton, Tracie B, NP   30 mL at 08/05/24 0306   amiodarone  (PACERONE ) tablet 400 mg  400 mg Oral Daily [name], [name], MD   400 mg at 08/06/24 0804   ARIPiprazole  (ABILIFY ) tablet 10 mg  10 mg Oral Daily Forever Arechiga, MD   10 mg at 08/06/24 0804   ARIPiprazole  ER (ABILIFY  MAINTENA) injection 400 mg  400 mg Intramuscular Q28 days Zelie Asbill, MD   400 mg at 07/27/24 9070   divalproex  (DEPAKOTE  ER) 24 hr tablet 500 mg  500 mg Oral QHS Rechy Bost, MD   500 mg at 08/05/24 2117   feeding supplement (ENSURE PLUS HIGH PROTEIN) liquid 237 mL  237 mL Oral TID BM [name], [name], MD   237 mL at 08/06/24 1021    hydrOXYzine  (ATARAX ) tablet 25 mg  25 mg Oral TID PRN Hampton, Tracie B, NP   25 mg at 08/06/24 9366   magnesium  hydroxide (MILK OF MAGNESIA) suspension 30 mL  30 mL Oral Daily PRN Hampton, Tracie B, NP   30 mL at 08/06/24 9366   memantine  (NAMENDA ) tablet 10 mg  10 mg Oral Daily Azaliyah Kennard, MD   10 mg at 08/06/24 0805   metoprolol  tartrate (LOPRESSOR ) tablet 25 mg  25 mg Oral BID [name], [name], MD   25 mg at 08/06/24 0804   naproxen  (NAPROSYN ) tablet 250 mg  250 mg Oral BID WC Edda Orea, MD   250 mg at 08/06/24 0805   OLANZapine  (ZYPREXA ) injection 5 mg  5 mg Intramuscular TID PRN Hampton, Tracie B, NP       OLANZapine   zydis (ZYPREXA ) disintegrating tablet 5 mg  5 mg Oral TID PRN Hampton, Tracie B, NP   5 mg at 08/04/24 1129   oxyCODONE  (Oxy IR/ROXICODONE ) immediate release tablet 5 mg  5 mg Oral Q4H PRN Dorinda Homans T, MD   5 mg at 08/05/24 2117   pantoprazole  (PROTONIX ) EC tablet 40 mg  40 mg Oral Daily [name], [name], MD   40 mg at 08/06/24 0805   traZODone  (DESYREL ) tablet 50 mg  50 mg Oral QHS PRN Hampton, Tracie B, NP   50 mg at 08/05/24 2117    Lab Results:  No results found for this or any previous visit (from the past 48 hours).   Blood Alcohol level:  Lab Results  Component Value Date   [facility] <15 07/17/2024   ETH <15 02/20/2024    Metabolic Disorder Labs: Lab Results  Component Value Date   HGBA1C 5.8 (H) 07/14/2018   MPG 119.76 07/14/2018   MPG 131.24 07/13/2018   No results found for: PROLACTIN Lab Results  Component Value Date   CHOL 170 07/13/2018   TRIG 34 03/08/2024   HDL 41 07/13/2018   CHOLHDL 4.1 07/13/2018   VLDL 10 07/13/2018   LDLCALC 119 (H) 07/13/2018    Physical Findings: AIMS:  , ,  ,  ,    CIWA:    COWS:      Psychiatric Specialty Exam:  Presentation  General Appearance: Appropriate for Environment  Eye Contact:Fleeting  Speech:Normal Rate  Speech Volume:Decreased    Mood and Affect   Mood:Anxious  Affect:Flat   Thought Process  Thought Processes:circumstantial Orientation:Partial  Thought Content:discharge focused  Hallucinations:denies  Ideas of Reference:Delusions At baseline Suicidal Thoughts:denies  Homicidal Thoughts:denies   Sensorium  Memory:Immediate Poor; Recent Poor; Remote Poor  Judgment:Impaired  Insight:None   Executive Functions  Concentration:Poor  Attention Span:Poor  Recall:Poor  Fund of Knowledge:Poor  Language:Fair   Psychomotor Activity  Psychomotor Activity:No data recorded  Musculoskeletal: Strength & Muscle Tone: within normal limits Gait & Station: normal Assets  Assets:Communication Skills; Desire for Improvement; Social Support    Physical Exam: Physical Exam Vitals and nursing note reviewed.    ROS Blood pressure 108/75, pulse 61, temperature 97.7 F (36.5 C), resp. rate 18, height 6' 1 (1.854 m), weight 81.2 kg, SpO2 95%. Body mass index is 23.62 kg/m.  Diagnosis: Active Problems:   Schizophrenia, paranoid (HCC)   Dementia with behavioral disturbance (HCC)   PLAN: Safety and Monitoring:  -- Involuntary admission to inpatient psychiatric unit for safety, stabilization and treatment  -- Daily contact with patient to assess and evaluate symptoms and progress in treatment  -- Patient's case to be discussed in multi-disciplinary team meeting  -- Observation Level : q15 minute checks  -- Vital signs:  q12 hours  -- Precautions: suicide, elopement, and assault -- Encouraged patient to participate in unit milieu and in scheduled group therapies  2. Psychiatric Treatment:  Scheduled Medications:  Discontinue Abilify  30 mg daily- LAY Abilify  Maintena 400 mg q4 weeks given on 07/27/24  Cogentin  1mg  one dose given for EPS -- The risks/benefits/side-effects/alternatives to this medication were discussed in detail with the patient and time was given for questions. The patient consents to medication  trial.  3. Medical Issues Being Addressed:  [facility] consulted for wound care management   Assessment and Plan:   Sacral Decubitus Ulcer Full thickness decubitus ulcer in setting of recent extended hospitalization, discharged from [facility] Nov 14th Wound looks pretty good overall WOC on board  we appreciate input   Cognitive Deficit Concern for anoxic brain injury at discharge 02/2024 Delirium precautions   Peripheral Vascular Disease Legs with features c/w peripheral vascular disease, diminished DP pulses Would benefit from outpatient ABI's   HFrEF Euvolemic at this time Continue home medications   Hx Atrial Flutter Not on any anticoagulation at this time EKG with NSR Continue amiodarone   Hypertension Noted, adjust meds gradually after med rec complete     4. Discharge Planning:   -- Social work and case management to assist with discharge planning and identification of hospital follow-up needs prior to discharge  -- Estimated LOS: 3-4 days  Allyn Foil, MD 08/06/2024, 1:29 PM

## 2024-08-06 NOTE — Plan of Care (Signed)
°  Problem: Education: Goal: Knowledge of [place] [facility] Education information/materials will improve Outcome: Progressing   Problem: Activity: Goal: Interest or engagement in activities will improve Outcome: Progressing   Problem: Coping: Goal: Ability to verbalize frustrations and anger appropriately will improve Outcome: Progressing   Problem: Education: Goal: Knowledge of [place] [facility] Education information/materials will improve Outcome: Progressing Goal: Emotional status will improve Outcome: Progressing Goal: Mental status will improve Outcome: Progressing Goal: Verbalization of understanding the information provided will improve Outcome: Progressing

## 2024-08-07 DIAGNOSIS — F200 Paranoid schizophrenia: Secondary | ICD-10-CM | POA: Diagnosis not present

## 2024-08-07 DIAGNOSIS — F03918 Unspecified dementia, unspecified severity, with other behavioral disturbance: Secondary | ICD-10-CM | POA: Diagnosis not present

## 2024-08-07 MED ORDER — DOCUSATE SODIUM 100 MG PO CAPS
100.0000 mg | ORAL_CAPSULE | Freq: Every day | ORAL | Status: AC
  Administered 2024-08-07 – 2024-10-02 (×56): 100 mg via ORAL
  Filled 2024-08-07 (×51): qty 1

## 2024-08-07 MED ORDER — DIVALPROEX SODIUM ER 500 MG PO TB24
1000.0000 mg | ORAL_TABLET | Freq: Every day | ORAL | Status: AC
  Administered 2024-08-07 – 2024-10-02 (×56): 1000 mg via ORAL
  Filled 2024-08-07 (×51): qty 2

## 2024-08-07 NOTE — Group Note (Signed)
 Date:  08/07/2024 Time:  3:30 PM  Group Topic/Focus:  House rules and expectations for the unit    Participation Level:  Active  Participation Quality:  Appropriate  Affect:  Appropriate  Cognitive:  Appropriate  Insight: Appropriate  Engagement in Group:  Engaged  Modes of Intervention:  Discussion and Education  Additional Comments:  none  [name] 08/07/2024, 3:30 PM

## 2024-08-07 NOTE — Progress Notes (Signed)
 [facility] MD Progress Note  08/07/2024 2:11 PM TOWNSEND CUDWORTH  MRN:  [id number]  Miguel [name]. Miguel [name] is a 73 year old male with a past psychiatric history significant for schizophrenia who presents under involuntary commitment initiated by family due to medication non-adherence, increasing paranoia, and behavioral disorganization. 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 Brother reports multiple episodes of patient whispering through the night, stating people were talking about him and planning to harm him. Patient was advised by his ACT Team to come to the ED; he refused, prompting the family to pursue IVC.Patient is admitted to [facility] unit with Q15 min safety monitoring. Multidisciplinary team approach is offered. Medication management; group/milieu therapy is offered.  Subjective:  Chart reviewed, case discussed in multidisciplinary meeting, patient seen during rounds.   Patient is noted to be talking to the peers on the unit.  He remains very confused and talks in random topics.  He remains fixated on the wound care claiming different people are coming and putting different things on his wound in the back.  Per nursing patient keeps removing the dressing.  Patient remains confused and approaches the nurses station multiple times throughout the day asking for medications and sometimes when they bring the medications refusing to take the medications.  Provider called patient's brother Miguel [name] and did discuss treatment plan of Depakote  being added as mood stabilizer and adjusting his dosage.   Brother informed the team that patient has received court hearing for guardianship on December 20. Past Psychiatric History: see h&P Family History:  Family History  Family history unknown: Yes   Social History:  Social History   Substance and Sexual Activity  Alcohol Use No     Social History   Substance and Sexual Activity  Drug Use No    Social History   Socioeconomic History   Marital status: Divorced    Spouse name: Not on file   Number of children: Not on file   Years of education: Not on file   Highest education level: Not on file  Occupational History   Not on file  Tobacco Use   Smoking status: Every Day    Current packs/day: 0.50    Average packs/day: 0.5 packs/day for 45.9 years (23.0 ttl pk-yrs)    Types: Cigarettes    Start date: 74   Smokeless tobacco: Never  Vaping Use   Vaping status: Never Used  Substance and Sexual Activity   Alcohol use: No   Drug use: No   Sexual activity: Not on file  Other Topics Concern   Not on file  Social History Narrative   Not on file   Social Drivers of Health   Tobacco Use: High Risk (07/18/2024)   Patient History    Smoking Tobacco Use: Every Day    Smokeless Tobacco Use: Never    Passive Exposure: Not on file  Financial Resource Strain: Not on file  Food Insecurity: Patient Unable To Answer (07/18/2024)   Epic    Worried About Programme Researcher, Broadcasting/film/video in the Last Year: Patient unable to answer    Ran Out of Food in the Last Year: Patient unable to answer  Transportation Needs: Unmet Transportation Needs (07/18/2024)  Epic    Lack of Transportation (Medical): Yes    Lack of Transportation (Non-Medical): Yes  Physical Activity: Not on file  Stress: Not on file  Social Connections: Patient Unable To Answer (07/18/2024)   Social Connection and Isolation Panel    Frequency of Communication with Friends and Family: Patient unable to answer    Frequency of Social Gatherings with Friends and Family: Patient  unable to answer    Attends Religious Services: Patient unable to answer    Active Member of Clubs or Organizations: Patient unable to answer    Attends Banker Meetings: Patient unable to answer    Marital Status: Patient unable to answer  Depression (PHQ2-9): Not on file  Alcohol Screen: Low Risk (07/18/2024)   Alcohol Screen    Last Alcohol Screening Score (AUDIT): 0  Housing: Unknown (07/18/2024)   Epic    Unable to Pay for Housing in the Last Year: Patient unable to answer    Number of Times Moved in the Last Year: 0    Homeless in the Last Year: Patient declined  Utilities: Patient Unable To Answer (07/18/2024)   Epic    Threatened with loss of utilities: Patient unable to answer  Health Literacy: Not on file   Past Medical History:  Past Medical History:  Diagnosis Date   Biventricular failure (HCC)    CVA (cerebral vascular accident) (HCC)    Polysubstance use disorder    Schizophrenia (HCC)     Past Surgical History:  Procedure Laterality Date   APPENDECTOMY     TRACHEOSTOMY TUBE PLACEMENT N/A 03/04/2024   Procedure: CREATION, TRACHEOSTOMY;  Surgeon: [name] Massie RAMAN, MD;  Location: [facility] ORS;  Service: ENT;  Laterality: N/A;    Current Medications: Current Facility-Administered Medications  Medication Dose Route Frequency Provider Last Rate Last Admin   acetaminophen  (TYLENOL ) tablet 650 mg  650 mg Oral Q6H PRN Hampton, Tracie B, NP   650 mg at 08/04/24 2147   alum & mag hydroxide-simeth (MAALOX/MYLANTA) 200-200-20 MG/5ML suspension 30 mL  30 mL Oral Q4H PRN Hampton, Tracie B, NP   30 mL at 08/05/24 0306   amiodarone  (PACERONE ) tablet 400 mg  400 mg Oral Daily [name], [name], MD   400 mg at 08/06/24 0804   ARIPiprazole  (ABILIFY ) tablet 10 mg  10 mg Oral Daily Dov Dill, MD   10 mg at 08/06/24 0804   ARIPiprazole  ER (ABILIFY  MAINTENA) injection 400 mg  400 mg Intramuscular Q28 days Donnelly Mellow, MD   400 mg at 07/27/24 9070   divalproex  (DEPAKOTE   ER) 24 hr tablet 500 mg  500 mg Oral QHS Merry Pond, MD   500 mg at 08/06/24 2147   docusate sodium  (COLACE) capsule 100 mg  100 mg Oral Daily Seleta Hovland, MD       feeding supplement (ENSURE PLUS HIGH PROTEIN) liquid 237 mL  237 mL Oral TID BM [name], [name], MD   237 mL at 08/07/24 1404   hydrOXYzine  (ATARAX ) tablet 25 mg  25 mg Oral TID PRN Hampton, Tracie B, NP   25 mg at 08/06/24 9366   magnesium  hydroxide (MILK OF MAGNESIA) suspension 30 mL  30 mL Oral Daily PRN Hampton, Tracie B, NP   30 mL at 08/06/24 9366   memantine  (NAMENDA ) tablet 10 mg  10 mg Oral Daily Asad Keeven, MD   10 mg at 08/06/24 0805   metoprolol  tartrate (LOPRESSOR ) tablet 25 mg  25 mg Oral BID Dorinda Drue DASEN, MD  25 mg at 08/06/24 2147   naproxen  (NAPROSYN ) tablet 250 mg  250 mg Oral BID WC Sibel Khurana, MD   250 mg at 08/06/24 1653   OLANZapine  (ZYPREXA ) injection 5 mg  5 mg Intramuscular TID PRN Hampton, Tracie B, NP       OLANZapine  zydis (ZYPREXA ) disintegrating tablet 5 mg  5 mg Oral TID PRN Hampton, Tracie B, NP   5 mg at 08/04/24 1129   oxyCODONE  (Oxy IR/ROXICODONE ) immediate release tablet 5 mg  5 mg Oral Q4H PRN Dorinda Homans T, MD   5 mg at 08/05/24 2117   pantoprazole  (PROTONIX ) EC tablet 40 mg  40 mg Oral Daily [name], [name], MD   40 mg at 08/06/24 0805   traZODone  (DESYREL ) tablet 50 mg  50 mg Oral QHS PRN Hampton, Tracie B, NP   50 mg at 08/05/24 2117    Lab Results:  No results found for this or any previous visit (from the past 48 hours).   Blood Alcohol level:  Lab Results  Component Value Date   [facility] <15 07/17/2024   ETH <15 02/20/2024    Metabolic Disorder Labs: Lab Results  Component Value Date   HGBA1C 5.8 (H) 07/14/2018   MPG 119.76 07/14/2018   MPG 131.24 07/13/2018   No results found for: PROLACTIN Lab Results  Component Value Date   CHOL 170 07/13/2018   TRIG 34 03/08/2024   HDL 41 07/13/2018   CHOLHDL 4.1 07/13/2018   VLDL 10 07/13/2018   LDLCALC 119 (H)  07/13/2018    Physical Findings: AIMS:  , ,  ,  ,    CIWA:    COWS:      Psychiatric Specialty Exam:  Presentation  General Appearance: Appropriate for Environment  Eye Contact:Fleeting  Speech:Normal Rate  Speech Volume:Decreased    Mood and Affect  Mood:Anxious  Affect:Flat   Thought Process  Thought Processes:circumstantial Orientation:Partial  Thought Content:discharge focused  Hallucinations:denies  Ideas of Reference:Delusions At baseline Suicidal Thoughts:denies  Homicidal Thoughts:denies   Sensorium  Memory:Immediate Poor; Recent Poor; Remote Poor  Judgment:Impaired  Insight:None   Executive Functions  Concentration:Poor  Attention Span:Poor  Recall:Poor  Fund of Knowledge:Poor  Language:Fair   Psychomotor Activity  Psychomotor Activity:No data recorded  Musculoskeletal: Strength & Muscle Tone: within normal limits Gait & Station: normal Assets  Assets:Communication Skills; Desire for Improvement; Social Support    Physical Exam: Physical Exam Vitals and nursing note reviewed.    ROS Blood pressure (!) 117/99, pulse 64, temperature 98.2 F (36.8 C), resp. rate 18, height 6' 1 (1.854 m), weight 81.2 kg, SpO2 100%. Body mass index is 23.62 kg/m.  Diagnosis: Active Problems:   Schizophrenia, paranoid (HCC)   Dementia with behavioral disturbance (HCC)   PLAN: Safety and Monitoring:  -- Involuntary admission to inpatient psychiatric unit for safety, stabilization and treatment  -- Daily contact with patient to assess and evaluate symptoms and progress in treatment  -- Patient's case to be discussed in multi-disciplinary team meeting  -- Observation Level : q15 minute checks  -- Vital signs:  q12 hours  -- Precautions: suicide, elopement, and assault -- Encouraged patient to participate in unit milieu and in scheduled group therapies  2. Psychiatric Treatment:  Scheduled Medications:  Added back Abilify  10 mg due  to worsening agitation and paranoia even after LAI - LAY Abilify  Maintena 400 mg q4 weeks given on 07/27/24  Cogentin  1mg  one dose given for EPS Depakote  ER increased to 1000 mg to help  with mood stabilization as he was receiving multiple as needed antipsychotics for agitation -- The risks/benefits/side-effects/alternatives to this medication were discussed in detail with the patient and time was given for questions. The patient consents to medication trial.  3. Medical Issues Being Addressed:  [facility] consulted for wound care management   Assessment and Plan:   Sacral Decubitus Ulcer Full thickness decubitus ulcer in setting of recent extended hospitalization, discharged from [facility] Nov 14th Wound looks pretty good overall WOC on board we appreciate input   Cognitive Deficit Concern for anoxic brain injury at discharge 02/2024 Delirium precautions   Peripheral Vascular Disease Legs with features c/w peripheral vascular disease, diminished DP pulses Would benefit from outpatient ABI's   HFrEF Euvolemic at this time Continue home medications   Hx Atrial Flutter Not on any anticoagulation at this time EKG with NSR Continue amiodarone   Hypertension Noted, adjust meds gradually after med rec complete     4. Discharge Planning:   -- Social work and case management to assist with discharge planning and identification of hospital follow-up needs prior to discharge  -- Estimated LOS: 3-4 days  Allyn Foil, MD 08/07/2024, 2:11 PM

## 2024-08-07 NOTE — Group Note (Signed)
 Date:  08/07/2024 Time:  8:45 PM  Group Topic/Focus:  Healthy Communication:   The focus of this group is to discuss communication, barriers to communication, as well as healthy ways to communicate with others.    Participation Level:  Active  Participation Quality:  Appropriate  Affect:  Appropriate  Cognitive:  Appropriate  Insight: Good  Engagement in Group:  Engaged  Modes of Intervention:  Discussion  Additional Comments:    Miguel [name] 08/07/2024, 8:45 PM

## 2024-08-07 NOTE — BHH Counselor (Signed)
 CSW contacted Romero from [employer] about [employer] placement.   Romero reports that [employer] has no openings at this time.   Romero reports they are also looking at [facility] as well.   Romero reports that she will keep CSW updated on placement.    Lum Croft, MSW, [place] 08/07/2024 12:54 PM

## 2024-08-07 NOTE — [facility] Counselor (Signed)
 CSW received message from CSW [place] that [place], pt's sister wants a call and does not have a code to visit pt.    CSW contacted [name] and [name] reports pt's brother, Cherene, is filing for guardianship of pt and went to the court yesterday to do so.   [place] pt's brother is awaiting a court date at this time.   CSW provided pt code to pt's sister so that pt can have visitors.   CSW informed pt's sister that pt will remain in the hospital at this time, but that Cl[place]inda is working to place pt.   Pt's sister agreeable.   Lum Croft, MSW, [place] 08/07/2024 12:53 PM

## 2024-08-07 NOTE — Progress Notes (Signed)
 This provider called [facility] [phone]  to update the treatment plan and medications and it went to voicemail that was full and unable to leave voicemail

## 2024-08-07 NOTE — Plan of Care (Signed)
°  Problem: Coping: Goal: Ability to verbalize frustrations and anger appropriately will improve Outcome: Progressing   Problem: Activity: Goal: Interest or engagement in activities will improve Outcome: Progressing Goal: Sleeping patterns will improve Outcome: Progressing   Problem: Coping: Goal: Ability to verbalize frustrations and anger appropriately will improve Outcome: Progressing   Problem: Safety: Goal: Periods of time without injury will increase Outcome: Progressing

## 2024-08-07 NOTE — Progress Notes (Signed)
 Behavior:   Irritable.  Difficult to re-direct at times.   Psych assessment:  Labile.  Denies SI/HI and AVH.    Interaction / Group attendance:  Present in the milieu.  Irritable with peers in dayroom this morning.  Medication/ PRNs:  Refused.  Dr. [name] made aware.   Pain: Denies.  15 min checks in place for safety.

## 2024-08-07 NOTE — Progress Notes (Addendum)
 Pt is alert and oriented with confusion. Pt sitting in his room with lights off. Flat affect. Mood irritable. Non sensual speech. Pt refused all PO bedtime medications, stating he's only taking the gold pill. Pt requires frequent prompting in taking medications but continued to refuse. Pt educated on ordered bedtime medications. Pt not receptive to teaching at this time. Denies SI/HI/AVH. Q15 min checks maintained for safety. No behavior issues noted. No c/o pain/discomfort noted. Slept 7.5 hours.   08/06/24 2100  Psych Admission Type (Psych Patients Only)  Admission Status Involuntary  Psychosocial Assessment  Patient Complaints Irritability  Eye Contact Intense  Facial Expression Animated  Affect Preoccupied  Speech Tangential  Interaction Assertive  Motor Activity Slow  Appearance/Hygiene Unremarkable  Behavior Characteristics Irritable  Mood Irritable  Thought Process  Coherency Tangential  Content Preoccupation  Delusions Paranoid  Perception WDL  Hallucination None reported or observed  Judgment Impaired  Confusion Mild  Danger to Self  Current suicidal ideation? Denies

## 2024-08-07 NOTE — [facility] Group Notes (Signed)
 Spirituality Group   Description: Participant directed exploration of values, beliefs and meaning   **Focus on Community & Connections: What does community look like to you? How do we know we are connecting?  What are barriers to connecting/relationship with others? Reflect and locate spiritual aspects (eg, purpose, joy, meaning, sense of self) of connection.   **Group named topics of acceptance, forgiveness, and openness to receiving (help, etc.)   Following a brief framework of chaplains role and ground rules of group behavior, participants are invited to share concerns or questions that engage spiritual life. Emphasis placed on common themes and shared experiences and ways to make meaning and clarify living into ones values.   Theory/Process/Goal: Utilize the theoretical framework of group therapy established by Celena Kite, Relational Cultural Theory and Rogerian approaches to facilitate relational empathy and use of the here and now to foster reflection, self-awareness, and sharing.   Observations: Robyn was an active participant in the group discussion.  Seymone Forlenza L. Delores HERO.Div

## 2024-08-07 NOTE — Group Note (Signed)
 Physical/Occupational Therapy Group Note  Group Topic: UE Therex   Group Date: 08/07/2024 Start Time: 1300 End Time: 1345 Facilitators: Clive Warren CROME, OT   Group Description: Group instructed in series of upper extremities exercises, aimed to promote strength, flexibility, range of motion and functional endurance.  Patients provided cuing for proper mechanics and proper pace of exercise; exercises adjusted as necessary for individualized patient needs.  Patient also engaged in cognitive components throughout session, working to integrate attention to task, command following, turn-taking and appropriate social interaction throughout session.  Allowed to ask questions as appropriate, and encouraged to identify specific exercises that they could complete independently outside of group sessions.  Therapeutic Goal(s):  Demonstrate appropriate performance of upper extremity exercises to promote strength, flexibility, range of motion and functional endurance Identify 2-3 specific upper extremity exercises to complete as home exercise program outside of group session  Individual Participation: Pt pleasant and participatory throughout. Pt required heavy multimodal cues for >75% of the exercises performed to improve technique. He demo'd difficulty understanding instructions, attempting to fold theraband around the instruction page requiring assist to redirect. Engaged in group discussion with and without prompting. Able to demo fair carryover.  Participation Level: Active and Engaged   Participation Quality: Moderate Cues and Maximum Cues   Behavior: Alert, Calm, and Cooperative   Speech/Thought Process: Relevant   Affect/Mood: Appropriate and Stable    Insight: Poor   Judgement: Poor   Modes of Intervention: Activity, Clarification, Discussion, Education, Exploration, Problem-solving, Rapport Building, Socialization, and Support  Patient Response to Interventions:  Engaged, Interested , and  Receptive   Plan: Continue to engage patient in PT/OT groups 1 - 2x/week.  Clary Boulais R., MPH, MS, OTR/L ascom [phone] 08/07/2024, 4:25 PM

## 2024-08-07 NOTE — Progress Notes (Signed)
°   08/07/24 1300  Spiritual Encounters  Type of Visit Initial  Care provided to: Patient  Conversation partners present during encounter Nurse  Referral source Other (comment) ([facility])  OnCall Visit No   Chaplain visited patient per an [place] [facility] in the system.  Patient shared life experiences and how he came to know the Lord.  Patient finds meaning and security in his faith and is open to sharing that with others.   Rev. Rana M. Nicholaus, M.Div. Chaplain Resident [facility]

## 2024-08-07 NOTE — Group Note (Signed)
 Recreation Therapy Group Note   Group Topic:Leisure Education  Group Date: 08/07/2024 Start Time: 1405 End Time: 1500 Facilitators: Celestia Jeoffrey BRAVO, LRT, CTRS Location: Dayroom  Group Description: Bingo. Patients played multiple rounds of bingo. LRT and pts discussed the definition of leisure, things they do in their free time outside of the hospital, and how bingo is also a leisure activity. Pts received a coloring book, word search book, or journal as a prize.    Goal Area(s) Addressed:  Patient will identify a current leisure interest.  Patient will learn the definition of leisure. Patient will have the opportunity to try a new leisure activity. Patient will communicate with peers and LRT.   Affect/Mood: Appropriate   Participation Level: Moderate    Clinical Observations/Individualized Feedback: Miguel [name] came late to group and was present for less than half of the session.   Plan: Continue to engage patient in RT group sessions 2-3x/week.   Jeoffrey BRAVO Celestia, LRT, CTRS 08/07/2024 5:01 PM

## 2024-08-07 NOTE — Group Note (Signed)
 Date:  08/07/2024 Time:  12:32 PM  Group Topic/Focus:  Identifying Needs:   The focus of this group is to help patients identify their personal needs that have been historically problematic and identify healthy behaviors to address their needs.    Participation Level:  Active  Participation Quality:  Appropriate  Affect:  Appropriate  Cognitive:  Appropriate  Insight: Appropriate  Engagement in Group:  Engaged  Modes of Intervention:  Discussion  Miguel [name] 08/07/2024, 12:32 PM

## 2024-08-07 NOTE — Plan of Care (Signed)
  Problem: Activity: Goal: Interest or engagement in activities will improve Outcome: Progressing   Problem: Education: Goal: Verbalization of understanding the information provided will improve Outcome: Not Progressing   Problem: Coping: Goal: Ability to demonstrate self-control will improve Outcome: Not Progressing

## 2024-08-08 DIAGNOSIS — F03918 Unspecified dementia, unspecified severity, with other behavioral disturbance: Secondary | ICD-10-CM | POA: Diagnosis not present

## 2024-08-08 DIAGNOSIS — F200 Paranoid schizophrenia: Secondary | ICD-10-CM | POA: Diagnosis not present

## 2024-08-08 NOTE — Progress Notes (Signed)
 SI/HI: denies  Behavior/Mood:Alert and oriented with confusion. Irritable and easily agitated. Denies anxiety and depression.   Interaction/Group:min interaction with peers and staff. Attends group.  Medications/PRNs:po med complaint. PRN given for pain.  Pain: sacral pain  Other: slept 5 hours   08/07/24 2200  Psych Admission Type (Psych Patients Only)  Admission Status Involuntary  Psychosocial Assessment  Patient Complaints Confusion  Eye Contact Fair  Facial Expression Animated  Affect Preoccupied  Speech Tangential  Interaction Assertive  Motor Activity Slow  Appearance/Hygiene Unremarkable  Behavior Characteristics Irritable  Mood Labile  Thought Process  Coherency Tangential  Content Preoccupation  Delusions Paranoid  Perception WDL  Hallucination None reported or observed  Judgment Impaired  Confusion Mild  Danger to Self  Current suicidal ideation? Denies

## 2024-08-08 NOTE — Progress Notes (Signed)
 Pt c/o constipation. Pt states he removed sacral dressing, saying I had to go to the bathroom and it was in my way-I took it off. MOM 30ml given PRN for constipation. Sacral dressing reapplied per order. No s/s of infection noted. Pt continues to refuse all PO bedtime medications. No c/o pain/discomfort noted.

## 2024-08-08 NOTE — Group Note (Signed)
 Date:  08/08/2024 Time:  4:13 PM  Group Topic/Focus:  Healthy Communication:   The focus of this group is to discuss communication, barriers to communication, as well as healthy ways to communicate with others.    Participation Level:  Active  Participation Quality:  Appropriate  Affect:  Appropriate  Cognitive:  Appropriate  Insight: Appropriate  Engagement in Group:  Engaged  Modes of Intervention:  Socialization  Additional Comments:  N/A  [name] 08/08/2024, 4:13 PM

## 2024-08-08 NOTE — Plan of Care (Signed)
°  Problem: Coping: Goal: Ability to verbalize frustrations and anger appropriately will improve Outcome: Progressing   Problem: Activity: Goal: Interest or engagement in activities will improve Outcome: Progressing Goal: Sleeping patterns will improve Outcome: Progressing   Problem: Safety: Goal: Periods of time without injury will increase Outcome: Progressing

## 2024-08-08 NOTE — Group Note (Signed)
 [facility] LCSW Group Therapy Note   Group Date: 08/08/2024 Start Time: 1601 End Time: 1631   Type of Therapy and Topic: Group Therapy: Avoiding Self-Sabotaging and Enabling Behaviors  Participation Level: Minimal  Mood:Calm  Description of Group:  In this group, patients will learn how to identify obstacles, self-sabotaging and enabling behaviors, as well as: what are they, why do we do them and what needs these behaviors meet. Discuss unhealthy relationships and how to have positive healthy boundaries with those that sabotage and enable. Explore aspects of self-sabotage and enabling in yourself and how to limit these self-destructive behaviors in everyday life.   Therapeutic Goals: 1. Patient will identify one obstacle that relates to self-sabotage and enabling behaviors 2. Patient will identify one personal self-sabotaging or enabling behavior they did prior to admission 3. Patient will state a plan to change the above identified behavior 4. Patient will demonstrate ability to communicate their needs through discussion and/or role play.    Summary of Patient Progress:  The facilitator and patient discussed self-sabotaging behaviors and their impact on daily life.  Group members chose and shared  strategies for managing it.  The facilitator and patient examined how different experiences can influence their behavior.  The patient reflected on how their behaviors shape their current thoughts and feelings about themselves.  The patient was receptive to feedback from both peers and the facilitator and contributed to creating a supportive environment, encouraging others to open up and shar    Therapeutic Modalities:  Cognitive Behavioral Therapy Person-Centered Therapy Motivational Interviewing    Rexene LELON Mae, LCSWA

## 2024-08-08 NOTE — Progress Notes (Signed)
 [facility] MD Progress Note  08/08/2024 7:04 PM Miguel [name]  MRN:  [id number]  Miguel [name]. Delcastillo is a 73 year old male with a past psychiatric history significant for schizophrenia who presents under involuntary commitment initiated by family due to medication non-adherence, increasing paranoia, and behavioral disorganization. 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 Brother reports multiple episodes of patient whispering through the night, stating people were talking about him and planning to harm him. Patient was advised by his ACT Team to come to the ED; he refused, prompting the family to pursue IVC.Patient is admitted to [facility] unit with Q15 min safety monitoring. Multidisciplinary team approach is offered. Medication management; group/milieu therapy is offered.  Subjective:  Chart reviewed, case discussed in multidisciplinary meeting, patient seen during rounds.   Patient is noted to be talking to the peers on the unit.  He remains very confused and talks in random topics.  He remains fixated on the wound care claiming different people are coming and putting different things on his wound in the back.  Per nursing patient keeps removing the dressing.  Patient remains confused and approaches the nurses station multiple times throughout the day asking for medications and sometimes when they bring the medications refusing to take the medications.  Provider called patient's brother Elenor and did discuss treatment plan of Depakote  being added as mood stabilizer and adjusting his dosage.   Brother informed the team that patient has received court hearing for guardianship on December 20.  08/08/2024: Patient continues to have a confused presentation and requires redirection in his thoughts. He was agreeable to take medications today per staff when he was offered in smaller number of pills. It is just too much. Metoprolol  was held today due to blood pressure. He attends groups and engages minimally and is out of his room most of the day. Alert and oriented to self at this time. He is sleeping and eating well. Denies any other concerns at this time.    Past Psychiatric History: see h&P Family History:  Family History  Family history unknown: Yes   Social History:  Social History   Substance and Sexual Activity  Alcohol Use No     Social History   Substance and Sexual Activity  Drug Use No    Social History   Socioeconomic History   Marital status: Divorced    Spouse name: Not on file   Number of children: Not on file   Years of education: Not on file   Highest education level: Not on file  Occupational History   Not on file  Tobacco Use   Smoking status: Every Day    Current packs/day: 0.50    Average packs/day: 0.5 packs/day for 45.9 years (23.0 ttl pk-yrs)    Types: Cigarettes    Start date: 85   Smokeless tobacco: Never  Vaping Use   Vaping status: Never Used  Substance and Sexual Activity   Alcohol use: No   Drug use: No   Sexual activity: Not on file  Other Topics Concern   Not on file  Social History Narrative   Not on file   Social Drivers of Health   Tobacco Use: High Risk (07/18/2024)  Patient History    Smoking Tobacco Use: Every Day    Smokeless Tobacco Use: Never    Passive Exposure: Not on file  Financial Resource Strain: Not on file  Food Insecurity: Patient Unable To Answer (07/18/2024)   Epic    Worried About Programme Researcher, Broadcasting/film/video in the Last Year: Patient unable to answer    Ran Out of Food in the Last Year: Patient unable  to answer  Transportation Needs: Unmet Transportation Needs (07/18/2024)   Epic    Lack of Transportation (Medical): Yes    Lack of Transportation (Non-Medical): Yes  Physical Activity: Not on file  Stress: Not on file  Social Connections: Patient Unable To Answer (07/18/2024)   Social Connection and Isolation Panel    Frequency of Communication with Friends and Family: Patient unable to answer    Frequency of Social Gatherings with Friends and Family: Patient unable to answer    Attends Religious Services: Patient unable to answer    Active Member of Clubs or Organizations: Patient unable to answer    Attends Banker Meetings: Patient unable to answer    Marital Status: Patient unable to answer  Depression (PHQ2-9): Not on file  Alcohol Screen: Low Risk (07/18/2024)   Alcohol Screen    Last Alcohol Screening Score (AUDIT): 0  Housing: Unknown (07/18/2024)   Epic    Unable to Pay for Housing in the Last Year: Patient unable to answer    Number of Times Moved in the Last Year: 0    Homeless in the Last Year: Patient declined  Utilities: Patient Unable To Answer (07/18/2024)   Epic    Threatened with loss of utilities: Patient unable to answer  Health Literacy: Not on file   Past Medical History:  Past Medical History:  Diagnosis Date   Biventricular failure (HCC)    CVA (cerebral vascular accident) (HCC)    Polysubstance use disorder    Schizophrenia (HCC)     Past Surgical History:  Procedure Laterality Date   APPENDECTOMY     TRACHEOSTOMY TUBE PLACEMENT N/A 03/04/2024   Procedure: CREATION, TRACHEOSTOMY;  Surgeon: [name] Massie RAMAN, MD;  Location: [facility] ORS;  Service: ENT;  Laterality: N/A;    Current Medications: Current Facility-Administered Medications  Medication Dose Route Frequency Provider Last Rate Last Admin   acetaminophen  (TYLENOL ) tablet 650 mg  650 mg Oral Q6H PRN Sole Lengacher B, NP   650 mg at 08/04/24 2147   alum & mag hydroxide-simeth  (MAALOX/MYLANTA) 200-200-20 MG/5ML suspension 30 mL  30 mL Oral Q4H PRN Keawe Marcello B, NP   30 mL at 08/05/24 0306   amiodarone  (PACERONE ) tablet 400 mg  400 mg Oral Daily [name], [name], MD   400 mg at 08/08/24 9076   ARIPiprazole  (ABILIFY ) tablet 10 mg  10 mg Oral Daily [name], [name], MD   10 mg at 08/08/24 9076   ARIPiprazole  ER (ABILIFY  MAINTENA) injection 400 mg  400 mg Intramuscular Q28 days [name], [name], MD   400 mg at 07/27/24 9070   divalproex  (DEPAKOTE  ER) 24 hr tablet 1,000 mg  1,000 mg Oral QHS [name], [name], MD   1,000 mg at 08/07/24 2033   docusate sodium  (COLACE) capsule 100 mg  100 mg Oral Daily [name], [name], MD   100 mg at 08/08/24 9076   feeding supplement (ENSURE PLUS HIGH PROTEIN) liquid 237 mL  237 mL Oral TID BM [name], [name], MD   237 mL at 08/08/24 1333   hydrOXYzine  (  ATARAX ) tablet 25 mg  25 mg Oral TID PRN Gladys Deckard B, NP   25 mg at 08/06/24 0633   magnesium  hydroxide (MILK OF MAGNESIA) suspension 30 mL  30 mL Oral Daily PRN Massey Ruhland B, NP   30 mL at 08/06/24 9366   memantine  (NAMENDA ) tablet 10 mg  10 mg Oral Daily [name], [name], MD   10 mg at 08/08/24 0923   metoprolol  tartrate (LOPRESSOR ) tablet 25 mg  25 mg Oral BID [name], [name], MD   25 mg at 08/07/24 2033   naproxen  (NAPROSYN ) tablet 250 mg  250 mg Oral BID WC [name], [name], MD   250 mg at 08/08/24 1606   OLANZapine  (ZYPREXA ) injection 5 mg  5 mg Intramuscular TID PRN Mariabella Nilsen B, NP       OLANZapine  zydis (ZYPREXA ) disintegrating tablet 5 mg  5 mg Oral TID PRN Jullia Mulligan B, NP   5 mg at 08/04/24 1129   oxyCODONE  (Oxy IR/ROXICODONE ) immediate release tablet 5 mg  5 mg Oral Q4H PRN Dorinda Drue DASEN, MD   5 mg at 08/07/24 2033   pantoprazole  (PROTONIX ) EC tablet 40 mg  40 mg Oral Daily [name], [name], MD   40 mg at 08/08/24 9076   traZODone  (DESYREL ) tablet 50 mg  50 mg Oral QHS PRN Jayland Null B, NP   50 mg at 08/05/24 2117    Lab Results:  No results found for this  or any previous visit (from the past 48 hours).   Blood Alcohol level:  Lab Results  Component Value Date   [facility] <15 07/17/2024   ETH <15 02/20/2024    Metabolic Disorder Labs: Lab Results  Component Value Date   HGBA1C 5.8 (H) 07/14/2018   MPG 119.76 07/14/2018   MPG 131.24 07/13/2018   No results found for: PROLACTIN Lab Results  Component Value Date   CHOL 170 07/13/2018   TRIG 34 03/08/2024   HDL 41 07/13/2018   CHOLHDL 4.1 07/13/2018   VLDL 10 07/13/2018   LDLCALC 119 (H) 07/13/2018    Physical Findings: AIMS:  , ,  ,  ,    CIWA:    COWS:      Psychiatric Specialty Exam:  Presentation  General Appearance: Appropriate for Environment  Eye Contact:Fleeting  Speech:Normal Rate  Speech Volume:Decreased    Mood and Affect  Mood:Anxious  Affect:Flat   Thought Process  Thought Processes:circumstantial Orientation:Partial  Thought Content:discharge focused  Hallucinations:denies  Ideas of Reference:Delusions At baseline Suicidal Thoughts:denies  Homicidal Thoughts:denies   Sensorium  Memory:Immediate Poor; Recent Poor; Remote Poor  Judgment:Impaired  Insight:None   Executive Functions  Concentration:Poor  Attention Span:Poor  Recall:Poor  Fund of Knowledge:Poor  Language:Fair   Psychomotor Activity  Psychomotor Activity:No data recorded  Musculoskeletal: Strength & Muscle Tone: within normal limits Gait & Station: normal Assets  Assets:Communication Skills; Desire for Improvement; Social Support    Physical Exam: Physical Exam Vitals and nursing note reviewed.    ROS Blood pressure (!) 116/54, pulse (!) 57, temperature 98.7 F (37.1 C), resp. rate 18, height 6' 1 (1.854 m), weight 81.2 kg, SpO2 97%. Body mass index is 23.62 kg/m.  Diagnosis: Active Problems:   Schizophrenia, paranoid (HCC)   Dementia with behavioral disturbance (HCC)   PLAN: Safety and Monitoring:  -- Involuntary admission to inpatient  psychiatric unit for safety, stabilization and treatment  -- Daily contact with patient to assess and evaluate symptoms and progress in treatment  -- Patient's case to  be discussed in multi-disciplinary team meeting  -- Observation Level : q15 minute checks  -- Vital signs:  q12 hours  -- Precautions: suicide, elopement, and assault -- Encouraged patient to participate in unit milieu and in scheduled group therapies  2. Psychiatric Treatment:  Scheduled Medications:  Added back Abilify  10 mg due to worsening agitation and paranoia even after LAI - LAY Abilify  Maintena 400 mg q4 weeks given on 07/27/24  Cogentin  1mg  one dose given for EPS Depakote  ER increased to 1000 mg to help with mood stabilization as he was receiving multiple as needed antipsychotics for agitation -- The risks/benefits/side-effects/alternatives to this medication were discussed in detail with the patient and time was given for questions. The patient consents to medication trial.  3. Medical Issues Being Addressed:  [facility] consulted for wound care management   Assessment and Plan:   Sacral Decubitus Ulcer Full thickness decubitus ulcer in setting of recent extended hospitalization, discharged from [facility] Nov 14th Wound looks pretty good overall WOC on board we appreciate input   Cognitive Deficit Concern for anoxic brain injury at discharge 02/2024 Delirium precautions   Peripheral Vascular Disease Legs with features c/w peripheral vascular disease, diminished DP pulses Would benefit from outpatient ABI's   HFrEF Euvolemic at this time Continue home medications   Hx Atrial Flutter Not on any anticoagulation at this time EKG with NSR Continue amiodarone   Hypertension Noted, adjust meds gradually after med rec complete     4. Discharge Planning:   -- Social work and case management to assist with discharge planning and identification of hospital follow-up needs prior to discharge  -- Estimated LOS: 3-4  days  Daine KATHEE Ober, NP 08/08/2024, 7:04 PM

## 2024-08-08 NOTE — Progress Notes (Signed)
 Pt remains irritable and easily agitated. Pt requested bedtime medication, states where is my medication so I can take my medicine. Medications were reviewed with patient. Pt then stated, I'm not taking it. These are not my medications I was only taking the gold pill at [employer].  Reviewed prescribed Po medications as scheduled by MD with the patient. Explained that medications ordered during his hospitalization may vary by manufacturer, facility, shape, size, and color. Pt remains argumentative and continued refusal. Will re-offer medications as appropriate. Pt continued to refuse all po medications despite teaching. Pt accepted Ensure supplement only. Denies SI/HI/AVH. No behavior issues noted at this time. Safety maintained on the unit. Slept at short intervals throughout the night.

## 2024-08-08 NOTE — Plan of Care (Signed)
°  Problem: Education: Goal: Knowledge of [place] [facility] Education information/materials will improve Outcome: Progressing   Problem: Activity: Goal: Interest or engagement in activities will improve Outcome: Progressing   Problem: Coping: Goal: Ability to verbalize frustrations and anger appropriately will improve Outcome: Progressing   Problem: Education: Goal: Knowledge of [place] [facility] Education information/materials will improve Outcome: Progressing

## 2024-08-08 NOTE — Group Note (Signed)
 Date:  08/08/2024 Time:  8:51 PM  Group Topic/Focus:  Wrap-Up Group:   The focus of this group is to help patients review their daily goal of treatment and discuss progress on daily workbooks.    Participation Level:  Did Not Attend  Miguel [name] 08/08/2024, 8:51 PM

## 2024-08-08 NOTE — Group Note (Signed)
 Date:  08/08/2024 Time:  10:15 AM  Group Topic/Focus:  Movement Therapy    Participation Level:  Did Not Attend    [name] 08/08/2024, 10:15 AM

## 2024-08-08 NOTE — Progress Notes (Signed)
°   08/08/24 0923  Psych Admission Type (Psych Patients Only)  Admission Status Involuntary  Psychosocial Assessment  Patient Complaints Confusion  Eye Contact Fair  Facial Expression Animated;Angry  Affect Preoccupied;Angry  Speech Tangential;Argumentative  Interaction Assertive  Motor Activity Slow  Appearance/Hygiene Unremarkable  Behavior Characteristics Irritable;Combative  Mood Irritable  Thought Process  Coherency Tangential  Content Preoccupation  Delusions None reported or observed  Perception WDL  Hallucination None reported or observed  Judgment Impaired  Confusion Mild  Danger to Self  Current suicidal ideation? Denies  Danger to Others  Danger to Others None reported or observed

## 2024-08-09 NOTE — Progress Notes (Signed)
 Foam dressing is intact on patient's sacrum/decubitus location

## 2024-08-09 NOTE — [facility] Group Notes (Signed)
 [facility] Group Notes:  (Nursing/MHT/Case Management/Adjunct)  Date:  08/09/2024  Time:  3:48 PM  Type of Therapy:  Nurse Education  Participation Level:  Did Not Attend  Participation Quality:  Patient with Provider  Affect:  Did not attend  Cognitive:  Did not attend  Insight:  Good  Engagement in Group:  not able to attend  Modes of Intervention:  support   Summary of Progress/Problems: Patient with Provider not able to attend group.   Sharlize Hoar 08/09/2024, 3:48 PM

## 2024-08-09 NOTE — Group Note (Signed)
 Date:  08/09/2024 Time:  10:21 AM  Group Topic/Focus:  Movement Therapy, Morning Stretch with Roselie Cirigliano.    Participation Level:  Did Not Attend    [name] 08/09/2024, 10:21 AM

## 2024-08-09 NOTE — Progress Notes (Signed)
 Sacral dressing reapplied several times throughout the night. Pt continues to remove dressing despite education attempts. Pt not receptive to teaching secondary to cognitive impairment.

## 2024-08-09 NOTE — Group Note (Signed)
 LCSW Group Therapy Note  Group Date: 08/09/2024 Start Time: 1300 End Time: 1345   Type of Therapy and Topic:  Group Therapy - Healthy vs Unhealthy Coping Skills  Participation Level:  Did Not Attend   Description of Group The focus of this group was to determine what unhealthy coping techniques typically are used by group members and what healthy coping techniques would be helpful in coping with various problems. Patients were guided in becoming aware of the differences between healthy and unhealthy coping techniques. Patients were asked to identify 2-3 healthy coping skills they would like to learn to use more effectively.  Therapeutic Goals Patients learned that coping is what human beings do all day long to deal with various situations in their lives Patients defined and discussed healthy vs unhealthy coping techniques Patients identified their preferred coping techniques and identified whether these were healthy or unhealthy Patients determined 2-3 healthy coping skills they would like to become more familiar with and use more often. Patients provided support and ideas to each other   Summary of Patient Progress:  Did not attend group   Therapeutic Modalities Cognitive Behavioral Therapy Motivational Interviewing  Miguel [name] M Zein Helbing, LCSWA 08/09/2024  3:48 PM

## 2024-08-09 NOTE — Plan of Care (Signed)
  Problem: Education: Goal: Knowledge of [place] [facility] Education information/materials will improve Outcome: Progressing   Problem: Activity: Goal: Interest or engagement in activities will improve Outcome: Progressing   Problem: Coping: Goal: Ability to verbalize frustrations and anger appropriately will improve Outcome: Progressing   Problem: Education: Goal: Knowledge of  General Education information/materials will improve Outcome: Progressing Goal: Emotional status will improve Outcome: Progressing Goal: Mental status will improve Outcome: Progressing Goal: Verbalization of understanding the information provided will improve Outcome: Progressing   Problem: Activity: Goal: Interest or engagement in activities will improve Outcome: Progressing Goal: Sleeping patterns will improve Outcome: Progressing   Problem: Coping: Goal: Ability to verbalize frustrations and anger appropriately will improve Outcome: Progressing Goal: Ability to demonstrate self-control will improve Outcome: Progressing   Problem: Health Behavior/Discharge Planning: Goal: Identification of resources available to assist in meeting health care needs will improve Outcome: Progressing Goal: Compliance with treatment plan for underlying cause of condition will improve Outcome: Progressing   Problem: Physical Regulation: Goal: Ability to maintain clinical measurements within normal limits will improve Outcome: Progressing   Problem: Safety: Goal: Periods of time without injury will increase Outcome: Progressing   Problem: Coping: Goal: Ability to adjust to condition or change in health will improve Outcome: Progressing   Problem: Health Behavior/Discharge Planning: Goal: Ability to identify and utilize available resources and services will improve Outcome: Progressing

## 2024-08-09 NOTE — Plan of Care (Signed)
°  Problem: Activity: Goal: Interest or engagement in activities will improve Outcome: Progressing Goal: Sleeping patterns will improve Outcome: Progressing   Problem: Safety: Goal: Periods of time without injury will increase Outcome: Progressing   Problem: Coping: Goal: Ability to adjust to condition or change in health will improve Outcome: Progressing

## 2024-08-09 NOTE — Progress Notes (Signed)
 [facility] MD Progress Note  08/09/2024 12:14 PM Miguel [name]  MRN:  [id number]  Miguel [name]. Miguel [name] is a 73 year old male with a past psychiatric history significant for schizophrenia who presents under involuntary commitment initiated by family due to medication non-adherence, increasing paranoia, and behavioral disorganization. 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 Brother reports multiple episodes of patient whispering through the night, stating people were talking about him and planning to harm him. Patient was advised by his ACT Team to come to the ED; he refused, prompting the family to pursue IVC.Patient is admitted to [facility] unit with Q15 min safety monitoring. Multidisciplinary team approach is offered. Medication management; group/milieu therapy is offered.  Subjective:  Chart reviewed, case discussed in multidisciplinary meeting, patient seen during rounds.   Patient continues to be confused about his health issues sometimes refuses to take medication states that he does not have been taking his medications in the past poor self-care poor hygiene circumstantial speech pattern does not have decisional capacity denies any current suicidal homicidal thoughts   Past Psychiatric History: see h&P Family History:  Family History  Family history unknown: Yes   Social History:  Social History   Substance and Sexual Activity  Alcohol Use No     Social History   Substance and Sexual Activity  Drug Use No    Social History   Socioeconomic History   Marital status:  Divorced    Spouse name: Not on file   Number of children: Not on file   Years of education: Not on file   Highest education level: Not on file  Occupational History   Not on file  Tobacco Use   Smoking status: Every Day    Current packs/day: 0.50    Average packs/day: 0.5 packs/day for 46.0 years (23.0 ttl pk-yrs)    Types: Cigarettes    Start date: 51   Smokeless tobacco: Never  Vaping Use   Vaping status: Never Used  Substance and Sexual Activity   Alcohol use: No   Drug use: No   Sexual activity: Not on file  Other Topics Concern   Not on file  Social History Narrative   Not on file   Social Drivers of Health   Tobacco Use: High Risk (07/18/2024)   Patient History    Smoking Tobacco Use: Every Day    Smokeless Tobacco Use: Never    Passive Exposure: Not on file  Financial Resource Strain: Not on file  Food Insecurity: Patient Unable To Answer (07/18/2024)   Epic    Worried About Programme Researcher, Broadcasting/film/video in the Last Year: Patient unable to answer    Ran Out of Food in the Last Year: Patient unable to answer  Transportation Needs: Unmet Transportation Needs (07/18/2024)   Epic    Lack of Transportation (Medical): Yes    Lack of Transportation (Non-Medical): Yes  Physical Activity: Not on file  Stress: Not on file  Social Connections: Patient Unable To Answer (07/18/2024)   Social Connection and Isolation Panel    Frequency of Communication with Friends and Family: Patient unable to answer    Frequency of Social Gatherings with Friends and Family: Patient unable to answer  Attends Religious Services: Patient unable to answer    Active Member of Clubs or Organizations: Patient unable to answer    Attends Club or Organization Meetings: Patient unable to answer    Marital Status: Patient unable to answer  Depression (PHQ2-9): Not on file  Alcohol Screen: Low Risk (07/18/2024)   Alcohol Screen    Last Alcohol Screening Score (AUDIT): 0  Housing: Unknown (07/18/2024)    Epic    Unable to Pay for Housing in the Last Year: Patient unable to answer    Number of Times Moved in the Last Year: 0    Homeless in the Last Year: Patient declined  Utilities: Patient Unable To Answer (07/18/2024)   Epic    Threatened with loss of utilities: Patient unable to answer  Health Literacy: Not on file   Past Medical History:  Past Medical History:  Diagnosis Date   Biventricular failure (HCC)    CVA (cerebral vascular accident) (HCC)    Polysubstance use disorder    Schizophrenia (HCC)     Past Surgical History:  Procedure Laterality Date   APPENDECTOMY     TRACHEOSTOMY TUBE PLACEMENT N/A 03/04/2024   Procedure: CREATION, TRACHEOSTOMY;  Surgeon: [name] Massie RAMAN, MD;  Location: [facility] ORS;  Service: ENT;  Laterality: N/A;    Current Medications: Current Facility-Administered Medications  Medication Dose Route Frequency Provider Last Rate Last Admin   acetaminophen  (TYLENOL ) tablet 650 mg  650 mg Oral Q6H PRN Hampton, Tracie B, NP   650 mg at 08/04/24 2147   alum & mag hydroxide-simeth (MAALOX/MYLANTA) 200-200-20 MG/5ML suspension 30 mL  30 mL Oral Q4H PRN Hampton, Tracie B, NP   30 mL at 08/05/24 0306   amiodarone  (PACERONE ) tablet 400 mg  400 mg Oral Daily [name], [name], MD   400 mg at 08/09/24 0912   ARIPiprazole  (ABILIFY ) tablet 10 mg  10 mg Oral Daily [name], [name], MD   10 mg at 08/09/24 0912   ARIPiprazole  ER (ABILIFY  MAINTENA) injection 400 mg  400 mg Intramuscular Q28 days [name], [name], MD   400 mg at 07/27/24 9070   divalproex  (DEPAKOTE  ER) 24 hr tablet 1,000 mg  1,000 mg Oral QHS [name], [name], MD   1,000 mg at 08/07/24 2033   docusate sodium  (COLACE) capsule 100 mg  100 mg Oral Daily [name], [name], MD   100 mg at 08/09/24 0912   feeding supplement (ENSURE PLUS HIGH PROTEIN) liquid 237 mL  237 mL Oral TID BM Walta Bellville R, MD   237 mL at 08/09/24 1000   hydrOXYzine  (ATARAX ) tablet 25 mg  25 mg Oral TID PRN Hampton, Tracie B, NP   25 mg at  08/06/24 9366   magnesium  hydroxide (MILK OF MAGNESIA) suspension 30 mL  30 mL Oral Daily PRN Hampton, Tracie B, NP   30 mL at 08/08/24 2233   memantine  (NAMENDA ) tablet 10 mg  10 mg Oral Daily [name], [name], MD   10 mg at 08/09/24 9087   metoprolol  tartrate (LOPRESSOR ) tablet 25 mg  25 mg Oral BID [name], [name], MD   25 mg at 08/09/24 9087   naproxen  (NAPROSYN ) tablet 250 mg  250 mg Oral BID WC [name], [name], MD   250 mg at 08/09/24 9071   OLANZapine  (ZYPREXA ) injection 5 mg  5 mg Intramuscular TID PRN Hampton, Tracie B, NP       OLANZapine  zydis (ZYPREXA ) disintegrating tablet 5 mg  5 mg Oral TID PRN Hampton, Tracie B, NP  5 mg at 08/04/24 1129   oxyCODONE  (Oxy IR/ROXICODONE ) immediate release tablet 5 mg  5 mg Oral Q4H PRN Dorinda Homans T, MD   5 mg at 08/07/24 2033   pantoprazole  (PROTONIX ) EC tablet 40 mg  40 mg Oral Daily [name], [name], MD   40 mg at 08/09/24 9087   traZODone  (DESYREL ) tablet 50 mg  50 mg Oral QHS PRN Hampton, Tracie B, NP   50 mg at 08/05/24 2117    Lab Results:  No results found for this or any previous visit (from the past 48 hours).   Blood Alcohol level:  Lab Results  Component Value Date   [facility] <15 07/17/2024   ETH <15 02/20/2024    Metabolic Disorder Labs: Lab Results  Component Value Date   HGBA1C 5.8 (H) 07/14/2018   MPG 119.76 07/14/2018   MPG 131.24 07/13/2018   No results found for: PROLACTIN Lab Results  Component Value Date   CHOL 170 07/13/2018   TRIG 34 03/08/2024   HDL 41 07/13/2018   CHOLHDL 4.1 07/13/2018   VLDL 10 07/13/2018   LDLCALC 119 (H) 07/13/2018    Physical Findings: AIMS:  , ,  ,  ,    CIWA:    COWS:      Psychiatric Specialty Exam:  Presentation  General Appearance: Appropriate for Environment  Eye Contact:Fleeting  Speech:Normal Rate  Speech Volume:Decreased    Mood and Affect  Mood:Anxious  Affect:Flat   Thought Process  Thought Processes:circumstantial Orientation:Partial  Thought  Content:discharge focused  Hallucinations:denies  Ideas of Reference:Delusions At baseline Suicidal Thoughts:denies  Homicidal Thoughts:denies   Sensorium  Memory:Immediate Poor; Recent Poor; Remote Poor  Judgment:Impaired  Insight:None   Executive Functions  Concentration:Poor  Attention Span:Poor  Recall:Poor  Fund of Knowledge:Poor  Language:Fair   Psychomotor Activity  Psychomotor Activity:No data recorded  Musculoskeletal: Strength & Muscle Tone: within normal limits Gait & Station: normal Assets  Assets:Communication Skills; Desire for Improvement; Social Support    Physical Exam: Physical Exam Vitals and nursing note reviewed.    ROS Blood pressure (!) 115/55, pulse 65, temperature 98.2 F (36.8 C), resp. rate 18, height 6' 1 (1.854 m), weight 81.2 kg, SpO2 98%. Body mass index is 23.62 kg/m.  Diagnosis: Active Problems:   Schizophrenia, paranoid (HCC)   Dementia with behavioral disturbance (HCC)   PLAN: Safety and Monitoring:  -- Involuntary admission to inpatient psychiatric unit for safety, stabilization and treatment  -- Daily contact with patient to assess and evaluate symptoms and progress in treatment  -- Patient's case to be discussed in multi-disciplinary team meeting  -- Observation Level : q15 minute checks  -- Vital signs:  q12 hours  -- Precautions: suicide, elopement, and assault -- Encouraged patient to participate in unit milieu and in scheduled group therapies  2. Psychiatric Treatment:  Scheduled Medications:  Added back Abilify  10 mg due to worsening agitation and paranoia even after LAI - LAY Abilify  Maintena 400 mg q4 weeks given on 07/27/24  Cogentin  1mg  one dose given for EPS Depakote  ER increased to 1000 mg to help with mood stabilization as he was receiving multiple as needed antipsychotics for agitation -- The risks/benefits/side-effects/alternatives to this medication were discussed in detail with the patient  and time was given for questions. The patient consents to medication trial.  3. Medical Issues Being Addressed:  [facility] consulted for wound care management   Assessment and Plan:   Sacral Decubitus Ulcer Full thickness decubitus ulcer in setting of recent extended  hospitalization, discharged from LTAC Nov 14th Wound looks pretty good overall WOC on board we appreciate input   Cognitive Deficit Concern for anoxic brain injury at discharge 02/2024 Delirium precautions   Peripheral Vascular Disease Legs with features c/w peripheral vascular disease, diminished DP pulses Would benefit from outpatient ABI's   HFrEF Euvolemic at this time Continue home medications   Hx Atrial Flutter Not on any anticoagulation at this time EKG with NSR Continue amiodarone   Hypertension Noted, adjust meds gradually after med rec complete     4. Discharge Planning:   -- Social work and case management to assist with discharge planning and identification of hospital follow-up needs prior to discharge  -- Estimated LOS: 3-4 days  Millie JONELLE Manners, MD 08/09/2024, 12:14 PM

## 2024-08-09 NOTE — Progress Notes (Signed)
°   08/09/24 0910  Psych Admission Type (Psych Patients Only)  Admission Status Involuntary  Psychosocial Assessment  Patient Complaints Confusion;Irritability  Eye Contact Fair  Facial Expression Animated  Affect Irritable  Speech Argumentative;Tangential  Interaction Minimal  Motor Activity Slow  Appearance/Hygiene Unremarkable  Behavior Characteristics Irritable  Aggressive Behavior  Effect No apparent injury  Thought Process  Coherency Tangential  Content Preoccupation  Delusions None reported or observed  Perception WDL  Hallucination None reported or observed  Judgment Impaired  Confusion Mild  Danger to Self  Current suicidal ideation? Denies  Danger to Others  Danger to Others None reported or observed

## 2024-08-09 NOTE — Group Note (Signed)
 Date:  08/09/2024 Time:  9:57 PM  Group Topic/Focus:  Wrap-Up Group:   The focus of this group is to help patients review their daily goal of treatment and discuss progress on daily workbooks.    Participation Level:  Active  Participation Quality:  Appropriate  Affect:  Appropriate  Cognitive:  Alert  Insight: Appropriate  Engagement in Group:  Engaged  Modes of Intervention:  Discussion  Additional Comments:    [name] 08/09/2024, 9:57 PM

## 2024-08-10 NOTE — Plan of Care (Signed)
  Problem: Education: Goal: Knowledge of [place] [facility] Education information/materials will improve Outcome: Progressing   Problem: Activity: Goal: Interest or engagement in activities will improve Outcome: Progressing   Problem: Coping: Goal: Ability to verbalize frustrations and anger appropriately will improve Outcome: Progressing   Problem: Education: Goal: Knowledge of  General Education information/materials will improve Outcome: Progressing Goal: Emotional status will improve Outcome: Progressing Goal: Mental status will improve Outcome: Progressing Goal: Verbalization of understanding the information provided will improve Outcome: Progressing   Problem: Activity: Goal: Interest or engagement in activities will improve Outcome: Progressing Goal: Sleeping patterns will improve Outcome: Progressing   Problem: Coping: Goal: Ability to verbalize frustrations and anger appropriately will improve Outcome: Progressing Goal: Ability to demonstrate self-control will improve Outcome: Progressing   Problem: Health Behavior/Discharge Planning: Goal: Identification of resources available to assist in meeting health care needs will improve Outcome: Progressing Goal: Compliance with treatment plan for underlying cause of condition will improve Outcome: Progressing   Problem: Physical Regulation: Goal: Ability to maintain clinical measurements within normal limits will improve Outcome: Progressing   Problem: Safety: Goal: Periods of time without injury will increase Outcome: Progressing   Problem: Coping: Goal: Ability to adjust to condition or change in health will improve Outcome: Progressing   Problem: Health Behavior/Discharge Planning: Goal: Ability to identify and utilize available resources and services will improve Outcome: Progressing

## 2024-08-10 NOTE — Group Note (Signed)
 Physical/Occupational Therapy Group Note  Group Topic: Yoga  Group Date: 08/10/2024 Start Time: 1500 End Time: 1530 Facilitators: Adeena Bernabe, Alm Hamilton, PT   Group Description: Group participated with series of yoga poses, designed to emphasize functional sitting balance, core stability, generalized flexibility and overall posture.  Incorporated deep breathing techniques with poses, working to promote relaxation, mindfulness and focus with targeted activities.  Discussed benefits of yoga in improving mood and self-esteem, reducing stress and anxiety, and promoting functional strength, balance and core stability for each participant.  Discussed ways to integrate into each participants daily routine.  Provided handout with written and pictorial descriptions of included yoga movements to be utilized as appropriate outside of group time.  Therapeutic Goal(s):  Demonstrate safe ability to participate with yoga poses during group activity. Identify one benefit of participation with yoga poses as part of each participants exercise/movement routine. Identify 1-2 individual poses that participant feels most beneficial to his/her needs and that he/she can easily replicate outside of group.  Individual Participation: Pt was quietly observant during the session with occasional participation during the activity portion of the session.  Pt was somewhat drowsy during the session and did appear to fall asleep briefly at times.    Participation Level: Moderate   Participation Quality: Minimal Cues   Behavior: Lethargic   Speech/Thought Process: Pt did not participate in discussion    Affect/Mood: Flat   Insight: Fair   Judgement: Fair   Modes of Intervention: Activity and Discussion  Patient Response to Interventions:  Attentive and engaged while awake    Plan: Continue to engage patient in PT/OT groups 1 - 2x/week.  CHARM Hamilton Bertin PT, DPT 08/10/2024, 5:13 PM

## 2024-08-10 NOTE — Group Note (Signed)
 Date:  08/10/2024 Time:  9:51 PM  Group Topic/Focus:  Wrap-Up Group:   The focus of this group is to help patients review their daily goal of treatment and discuss progress on daily workbooks.    Participation Level:  Did Not Attend  Participation Quality:     Affect:     Cognitive:     Insight: None  Engagement in Group:  None  Modes of Intervention:     Additional Comments:    [name] 08/10/2024, 9:51 PM

## 2024-08-10 NOTE — Group Note (Signed)
 Recreation Therapy Group Note   Group Topic:Emotion Expression  Group Date: 08/10/2024 Start Time: 1410 End Time: 1500 Facilitators: Celestia Jeoffrey BRAVO, LRT, CTRS Location: Craft Room  Group Description: Positivity Collage. LRT and patients discussed the importance of having a positive mindset and being happy. Patients received magazines, safety scissors, a glue stick and a piece of paper. Pts were encouraged to find images or words in the magazines that showed happiness or positivity to them. Pt shared their collage with the group once they were finished. LRT and pts discussed how it can be difficult to always have a positive mindset, especially when they have mental health challenges.   Goal Area(s) Addressed:  Pt will identify things associate with positivity. Pt will reduce negative thinking. Pt will identify a new coping skill of thinking positive thoughts.    Affect/Mood: N/A   Participation Level: Non-verbal    Clinical Observations/Individualized Feedback: Miguel [name] was present in group, however was asleep.   Plan: Continue to engage patient in RT group sessions 2-3x/week.   Jeoffrey BRAVO Celestia, LRT, CTRS 08/10/2024 4:34 PM

## 2024-08-10 NOTE — Plan of Care (Signed)
  Problem: Activity: Goal: Interest or engagement in activities will improve Outcome: Progressing   Problem: Coping: Goal: Ability to verbalize frustrations and anger appropriately will improve Outcome: Progressing   Problem: Activity: Goal: Interest or engagement in activities will improve Outcome: Progressing

## 2024-08-10 NOTE — Group Note (Signed)
 Date:  08/10/2024 Time:  12:10 PM  Group Topic/Focus:   Coping With Mental Health Crisis:   The purpose of this group is to help patients identify strategies for coping with mental health crisis.  Group discusses possible causes of crisis and ways to manage them effectively. Personal Choices and Values:   The focus of this group is to help patients assess and explore the importance of values in their lives, how their values affect their decisions, how they express their values and what opposes their expression.  The patients had the opportunity to create vision boards and set goals for the upcoming new year. We discussed new goals and dreams, along with realistic time frames for accomplishing them. This activity encouraged reflection, motivation, and hope. It also served as a print production planner experience and promoted positivity and a more optimistic outlook on life.    Participation Level:  Minimal  Participation Quality:  Appropriate  Affect:  Appropriate  Cognitive:  Appropriate  Insight: Limited  Engagement in Group:  None  Modes of Intervention:  Activity and Discussion  Additional Comments:    Miguel [name] [name] 08/10/2024, 12:10 PM

## 2024-08-10 NOTE — Progress Notes (Signed)
 Pt denies SI/HI/AVH this shift. PRN Atarax  25mg  and Trazodone  50mg  administered for restlessness throughout the night. Pt got up a few times during the night. Voiced no complaints to this clinical research associate.  08/09/24 2116  Psych Admission Type (Psych Patients Only)  Admission Status Involuntary  Psychosocial Assessment  Patient Complaints Confusion  Eye Contact Fair  Facial Expression Animated  Affect Labile  Speech Tangential  Interaction Assertive  Motor Activity Slow  Appearance/Hygiene In scrubs  Behavior Characteristics Cooperative  Mood Pleasant;Labile  Thought Process  Coherency Circumstantial  Content WDL  Delusions None reported or observed  Perception WDL  Hallucination None reported or observed  Judgment Impaired  Confusion Mild  Danger to Self  Current suicidal ideation? Denies  Danger to Others  Danger to Others None reported or observed

## 2024-08-10 NOTE — Progress Notes (Signed)
 Behavior:  Mild confusion. Pleasant and cooperative.   Psych assessment:  Denies SI/HI and AVH.  Interaction / Group attendance:  Present in the milieu.  Appropriate interaction with peers and staff.  Attends groups.  Medication/ PRNs: Compliant.  PRN pain medication given as ordered.  Pain: 8/10 legs  15 min checks in place for safety.

## 2024-08-11 DIAGNOSIS — F200 Paranoid schizophrenia: Secondary | ICD-10-CM | POA: Diagnosis not present

## 2024-08-11 DIAGNOSIS — F03918 Unspecified dementia, unspecified severity, with other behavioral disturbance: Secondary | ICD-10-CM | POA: Diagnosis not present

## 2024-08-11 NOTE — Group Note (Signed)
 LCSW Group Therapy Note   Group Date: 08/11/2024 Start Time: 1315 End Time: 1330   Type of Therapy and Topic:  Group Therapy: Boundaries  Participation Level:  Did Not Attend  Description of Group: This group will address the use of boundaries in their personal lives. Patients will explore why boundaries are important, the difference between healthy and unhealthy boundaries, and negative and postive outcomes of different boundaries and will look at how boundaries can be crossed.  Patients will be encouraged to identify current boundaries in their own lives and identify what kind of boundary is being set. Facilitators will guide patients in utilizing problem-solving interventions to address and correct types boundaries being used and to address when no boundary is being used. Understanding and applying boundaries will be explored and addressed for obtaining and maintaining a balanced life. Patients will be encouraged to explore ways to assertively make their boundaries and needs known to significant others in their lives, using other group members and facilitator for role play, support, and feedback.  Therapeutic Goals:  1.  Patient will identify areas in their life where setting clear boundaries could be  used to improve their life.  2.  Patient will identify signs/triggers that a boundary is not being respected. 3.  Patient will identify two ways to set boundaries in order to achieve balance in  their lives: 4.  Patient will demonstrate ability to communicate their needs and set boundaries  through discussion and/or role plays  Summary of Patient Progress:  x  Therapeutic Modalities:   Cognitive Behavioral Therapy Solution-Focused Therapy  [name], LCSWA 08/11/2024  1:53 PM

## 2024-08-11 NOTE — Progress Notes (Signed)
 [facility] MD Progress Note  08/11/2024 12:09 PM Miguel [name]  MRN:  [id number]  Miguel [name] is a 73 year old male with a past psychiatric history significant for schizophrenia who presents under involuntary commitment initiated by family due to medication non-adherence, increasing paranoia, and behavioral disorganization. 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 Brother reports multiple episodes of patient whispering through the night, stating people were talking about him and planning to harm him. Patient was advised by his ACT Team to come to the ED; he refused, prompting the family to pursue IVC.Patient is admitted to [facility] unit with Q15 min safety monitoring. Multidisciplinary team approach is offered. Medication management; group/milieu therapy is offered.  Subjective:  Chart reviewed, case discussed in multidisciplinary meeting, patient seen during rounds.  Patient is noted to be sitting in a chair.  He complained to the provider that his room is too cold and he has hard time sleeping.  When asked about medications he lacks insight into the medications he is taking and the need for medications.  Per nursing patient needs a lot of encouragement to take his medications but has been compliant with treatment so far.  Patient remains paranoid about people coming into his room and taking away things or misplaced.  He is not able to comprehend that he is in the hospital and the staff  Past Psychiatric History: see h&P Family History:  Family History  Family history unknown: Yes    Social History:  Social History   Substance and Sexual Activity  Alcohol Use No     Social History   Substance and Sexual Activity  Drug Use No    Social History   Socioeconomic History   Marital status: Divorced    Spouse name: Not on file   Number of children: Not on file   Years of education: Not on file   Highest education level: Not on file  Occupational History   Not on file  Tobacco Use   Smoking status: Every Day    Current packs/day: 0.50    Average packs/day: 0.5 packs/day for 46.0 years (23.0 ttl pk-yrs)    Types: Cigarettes    Start date: 47   Smokeless tobacco: Never  Vaping Use   Vaping status: Never Used  Substance and Sexual Activity   Alcohol use: No   Drug use: No   Sexual activity: Not on file  Other Topics Concern   Not on file  Social History Narrative   Not on file   Social Drivers of Health   Tobacco Use: High Risk (07/18/2024)   Patient History    Smoking Tobacco Use: Every Day    Smokeless Tobacco Use: Never    Passive Exposure: Not on file  Financial Resource Strain: Not on file  Food Insecurity: Patient Unable To Answer (07/18/2024)   Epic    Worried About Programme Researcher, Broadcasting/film/video in the Last Year: Patient unable to answer    Ran Out of Food in the Last Year: Patient unable to answer  Transportation Needs: Unmet Transportation Needs (07/18/2024)   Epic    Lack of Transportation (Medical): Yes    Lack of Transportation (Non-Medical): Yes  Physical Activity: Not  on file  Stress: Not on file  Social Connections: Patient Unable To Answer (07/18/2024)   Social Connection and Isolation Panel    Frequency of Communication with Friends and Family: Patient unable to answer    Frequency of Social Gatherings with Friends and Family: Patient unable to answer    Attends Religious Services: Patient unable to answer    Active Member of Clubs or Organizations: Patient unable to answer    Attends Banker Meetings: Patient unable  to answer    Marital Status: Patient unable to answer  Depression (PHQ2-9): Not on file  Alcohol Screen: Low Risk (07/18/2024)   Alcohol Screen    Last Alcohol Screening Score (AUDIT): 0  Housing: Unknown (07/18/2024)   Epic    Unable to Pay for Housing in the Last Year: Patient unable to answer    Number of Times Moved in the Last Year: 0    Homeless in the Last Year: Patient declined  Utilities: Patient Unable To Answer (07/18/2024)   Epic    Threatened with loss of utilities: Patient unable to answer  Health Literacy: Not on file   Past Medical History:  Past Medical History:  Diagnosis Date   Biventricular failure (HCC)    CVA (cerebral vascular accident) (HCC)    Polysubstance use disorder    Schizophrenia (HCC)     Past Surgical History:  Procedure Laterality Date   APPENDECTOMY     TRACHEOSTOMY TUBE PLACEMENT N/A 03/04/2024   Procedure: CREATION, TRACHEOSTOMY;  Surgeon: [name] Massie RAMAN, MD;  Location: [facility] ORS;  Service: ENT;  Laterality: N/A;    Current Medications: Current Facility-Administered Medications  Medication Dose Route Frequency Provider Last Rate Last Admin   acetaminophen  (TYLENOL ) tablet 650 mg  650 mg Oral Q6H PRN Hampton, Tracie B, NP   650 mg at 08/04/24 2147   alum & mag hydroxide-simeth (MAALOX/MYLANTA) 200-200-20 MG/5ML suspension 30 mL  30 mL Oral Q4H PRN Hampton, Tracie B, NP   30 mL at 08/05/24 0306   amiodarone  (PACERONE ) tablet 400 mg  400 mg Oral Daily [name], [name], MD   400 mg at 08/11/24 9145   ARIPiprazole  (ABILIFY ) tablet 10 mg  10 mg Oral Daily Miguel Guillet, MD   10 mg at 08/11/24 0854   ARIPiprazole  ER (ABILIFY  MAINTENA) injection 400 mg  400 mg Intramuscular Q28 days Miguel Klinker, MD   400 mg at 07/27/24 9070   divalproex  (DEPAKOTE  ER) 24 hr tablet 1,000 mg  1,000 mg Oral QHS Miguel Alberts, MD   1,000 mg at 08/10/24 2147   docusate sodium  (COLACE) capsule 100 mg  100 mg Oral Daily Miguel Dail, MD   100 mg at 08/11/24 0853    feeding supplement (ENSURE PLUS HIGH PROTEIN) liquid 237 mL  237 mL Oral TID BM [name], [name], MD   237 mL at 08/11/24 0903   hydrOXYzine  (ATARAX ) tablet 25 mg  25 mg Oral TID PRN Hampton, Tracie B, NP   25 mg at 08/09/24 2117   magnesium  hydroxide (MILK OF MAGNESIA) suspension 30 mL  30 mL Oral Daily PRN Hampton, Tracie B, NP   30 mL at 08/08/24 2233   memantine  (NAMENDA ) tablet 10 mg  10 mg Oral Daily Eilam Shrewsbury, MD   10 mg at 08/11/24 9146   metoprolol  tartrate (LOPRESSOR ) tablet 25 mg  25 mg Oral BID Dorinda Homans T, MD   25 mg at 08/11/24 0853   naproxen  (NAPROSYN ) tablet 250 mg  250 mg Oral  BID WC Petros Ahart, MD   250 mg at 08/11/24 9146   OLANZapine  (ZYPREXA ) injection 5 mg  5 mg Intramuscular TID PRN Hampton, Tracie B, NP       OLANZapine  zydis (ZYPREXA ) disintegrating tablet 5 mg  5 mg Oral TID PRN Hampton, Tracie B, NP   5 mg at 08/04/24 1129   oxyCODONE  (Oxy IR/ROXICODONE ) immediate release tablet 5 mg  5 mg Oral Q4H PRN Dorinda Homans T, MD   5 mg at 08/10/24 1336   pantoprazole  (PROTONIX ) EC tablet 40 mg  40 mg Oral Daily [name], [name], MD   40 mg at 08/11/24 9145   traZODone  (DESYREL ) tablet 50 mg  50 mg Oral QHS PRN Hampton, Tracie B, NP   50 mg at 08/10/24 2147    Lab Results:  No results found for this or any previous visit (from the past 48 hours).   Blood Alcohol level:  Lab Results  Component Value Date   [facility] <15 07/17/2024   ETH <15 02/20/2024    Metabolic Disorder Labs: Lab Results  Component Value Date   HGBA1C 5.8 (H) 07/14/2018   MPG 119.76 07/14/2018   MPG 131.24 07/13/2018   No results found for: PROLACTIN Lab Results  Component Value Date   CHOL 170 07/13/2018   TRIG 34 03/08/2024   HDL 41 07/13/2018   CHOLHDL 4.1 07/13/2018   VLDL 10 07/13/2018   LDLCALC 119 (H) 07/13/2018    Physical Findings: AIMS:  , ,  ,  ,    CIWA:    COWS:      Psychiatric Specialty Exam:  Presentation  General Appearance: Appropriate for  Environment  Eye Contact:Fleeting  Speech:Normal Rate  Speech Volume:Decreased    Mood and Affect  Mood:Anxious  Affect:Flat   Thought Process  Thought Processes:circumstantial Orientation:Partial  Thought Content:discharge focused  Hallucinations:denies  Ideas of Reference:Delusions At baseline Suicidal Thoughts:denies  Homicidal Thoughts:denies   Sensorium  Memory:Immediate Poor; Recent Poor; Remote Poor  Judgment:Impaired  Insight:None   Executive Functions  Concentration:Poor  Attention Span:Poor  Recall:Poor  Fund of Knowledge:Poor  Language:Fair   Psychomotor Activity  Psychomotor Activity:No data recorded  Musculoskeletal: Strength & Muscle Tone: within normal limits Gait & Station: normal Assets  Assets:Communication Skills; Desire for Improvement; Social Support    Physical Exam: Physical Exam Vitals and nursing note reviewed.    ROS Blood pressure 125/62, pulse 73, temperature (!) 97.2 F (36.2 C), resp. rate 17, height 6' 1 (1.854 m), weight 81.2 kg, SpO2 92%. Body mass index is 23.62 kg/m.  Diagnosis: Active Problems:   Schizophrenia, paranoid (HCC)   Dementia with behavioral disturbance (HCC)   PLAN: Safety and Monitoring:  -- Involuntary admission to inpatient psychiatric unit for safety, stabilization and treatment  -- Daily contact with patient to assess and evaluate symptoms and progress in treatment  -- Patient's case to be discussed in multi-disciplinary team meeting  -- Observation Level : q15 minute checks  -- Vital signs:  q12 hours  -- Precautions: suicide, elopement, and assault -- Encouraged patient to participate in unit milieu and in scheduled group therapies  2. Psychiatric Treatment:  Scheduled Medications:  Added back Abilify  10 mg due to worsening agitation and paranoia even after LAI -Abilify  10 mg daily LAY Abilify  Maintena 400 mg q4 weeks given on 07/27/24  Cogentin  1mg  one dose given for  EPS Depakote  ER increased to 1000 mg to help with mood stabilization as he was receiving multiple as needed antipsychotics for agitation --  The risks/benefits/side-effects/alternatives to this medication were discussed in detail with the patient and time was given for questions. The patient consents to medication trial.  3. Medical Issues Being Addressed:  [facility] consulted for wound care management   Assessment and Plan:   Sacral Decubitus Ulcer Full thickness decubitus ulcer in setting of recent extended hospitalization, discharged from [facility] Wound looks pretty good overall WOC on board we appreciate input   Cognitive Deficit Concern for anoxic brain injury at discharge 02/2024 Delirium precautions   Peripheral Vascular Disease Legs with features c/w peripheral vascular disease, diminished DP pulses Would benefit from outpatient ABI's   HFrEF Euvolemic at this time Continue home medications   Hx Atrial Flutter Not on any anticoagulation at this time EKG with NSR Continue amiodarone   Hypertension Noted, adjust meds gradually after med rec complete     4. Discharge Planning:   -- Social work and case management to assist with discharge planning and identification of hospital follow-up needs prior to discharge  -- Estimated LOS: 3-4 days  Allyn Foil, MD 08/11/2024, 12:09 PM

## 2024-08-11 NOTE — Plan of Care (Signed)
°  Problem: Activity: Goal: Interest or engagement in activities will improve Outcome: Progressing   Problem: Coping: Goal: Ability to demonstrate self-control will improve Outcome: Progressing   Problem: Education: Goal: Verbalization of understanding the information provided will improve Outcome: Not Progressing

## 2024-08-11 NOTE — Progress Notes (Signed)
°   08/10/24 2000  Psych Admission Type (Psych Patients Only)  Admission Status Involuntary  Psychosocial Assessment  Patient Complaints Confusion  Eye Contact Fair  Facial Expression Animated  Affect Preoccupied  Speech Tangential  Interaction Assertive  Motor Activity Slow  Appearance/Hygiene In scrubs  Behavior Characteristics Cooperative  Mood Pleasant  Thought Process  Coherency Tangential  Content Preoccupation  Delusions None reported or observed  Perception WDL  Hallucination None reported or observed  Judgment Impaired  Confusion Mild  Danger to Self  Current suicidal ideation? Denies  Danger to Others  Danger to Others None reported or observed

## 2024-08-11 NOTE — Plan of Care (Signed)
  Problem: Education: Goal: Knowledge of [place] [facility] Education information/materials will improve Outcome: Progressing   Problem: Activity: Goal: Interest or engagement in activities will improve Outcome: Progressing   Problem: Coping: Goal: Ability to verbalize frustrations and anger appropriately will improve Outcome: Progressing   Problem: Education: Goal: Knowledge of  General Education information/materials will improve Outcome: Progressing Goal: Emotional status will improve Outcome: Progressing Goal: Mental status will improve Outcome: Progressing Goal: Verbalization of understanding the information provided will improve Outcome: Progressing   Problem: Activity: Goal: Interest or engagement in activities will improve Outcome: Progressing Goal: Sleeping patterns will improve Outcome: Progressing   Problem: Coping: Goal: Ability to verbalize frustrations and anger appropriately will improve Outcome: Progressing Goal: Ability to demonstrate self-control will improve Outcome: Progressing   Problem: Health Behavior/Discharge Planning: Goal: Identification of resources available to assist in meeting health care needs will improve Outcome: Progressing Goal: Compliance with treatment plan for underlying cause of condition will improve Outcome: Progressing   Problem: Physical Regulation: Goal: Ability to maintain clinical measurements within normal limits will improve Outcome: Progressing   Problem: Safety: Goal: Periods of time without injury will increase Outcome: Progressing   Problem: Coping: Goal: Ability to adjust to condition or change in health will improve Outcome: Progressing   Problem: Health Behavior/Discharge Planning: Goal: Ability to identify and utilize available resources and services will improve Outcome: Progressing

## 2024-08-11 NOTE — Group Note (Signed)
 Date:  08/11/2024 Time:  11:38 AM  Group Topic/Focus:  Goals Group:   The focus of this group is to help patients establish daily goals to achieve during treatment and discuss how the patient can incorporate goal setting into their daily lives to aide in recovery. Healthy Communication:   The focus of this group is to discuss communication, barriers to communication, as well as healthy ways to communicate with others.    Participation Level:  Minimal  Participation Quality:  Appropriate  Affect:  Appropriate  Cognitive:  Appropriate  Insight: Appropriate and Limited  Engagement in Group:  Limited  Modes of Intervention:  Activity and Discussion  Additional Comments:    Miguel [name] [name] 08/11/2024, 11:38 AM

## 2024-08-11 NOTE — BH IP Treatment Plan (Signed)
 Interdisciplinary Treatment and Diagnostic Plan Update  08/11/2024 Time of Session: 9:00 AM Miguel [name] MRN: [id number]  Principal Diagnosis: <principal problem not specified>  Secondary Diagnoses: Active Problems:   Schizophrenia, paranoid (HCC)   Dementia with behavioral disturbance (HCC)   Current Medications:  Current Facility-Administered Medications  Medication Dose Route Frequency Provider Last Rate Last Admin   acetaminophen  (TYLENOL ) tablet 650 mg  650 mg Oral Q6H PRN Hampton, Tracie B, NP   650 mg at 08/04/24 2147   alum & mag hydroxide-simeth (MAALOX/MYLANTA) 200-200-20 MG/5ML suspension 30 mL  30 mL Oral Q4H PRN Hampton, Tracie B, NP   30 mL at 08/05/24 9693   amiodarone  (PACERONE ) tablet 400 mg  400 mg Oral Daily [name], [name], MD   400 mg at 08/11/24 0854   ARIPiprazole  (ABILIFY ) tablet 10 mg  10 mg Oral Daily [name], [name], MD   10 mg at 08/11/24 0854   ARIPiprazole  ER (ABILIFY  MAINTENA) injection 400 mg  400 mg Intramuscular Q28 days [name], [name], MD   400 mg at 07/27/24 9070   divalproex  (DEPAKOTE  ER) 24 hr tablet 1,000 mg  1,000 mg Oral QHS [name], [name], MD   1,000 mg at 08/10/24 2147   docusate sodium  (COLACE) capsule 100 mg  100 mg Oral Daily [name], [name], MD   100 mg at 08/11/24 0853   feeding supplement (ENSURE PLUS HIGH PROTEIN) liquid 237 mL  237 mL Oral TID BM [name], [name], MD   237 mL at 08/11/24 9096   hydrOXYzine  (ATARAX ) tablet 25 mg  25 mg Oral TID PRN Hampton, Tracie B, NP   25 mg at 08/09/24 2117   magnesium  hydroxide (MILK OF MAGNESIA) suspension 30 mL  30 mL Oral Daily PRN Hampton, Tracie B, NP   30 mL at 08/08/24 2233   memantine  (NAMENDA ) tablet 10 mg  10 mg Oral Daily [name], [name], MD   10 mg at 08/11/24 9146   metoprolol  tartrate (LOPRESSOR ) tablet 25 mg  25 mg Oral BID [name], [name], MD   25 mg at 08/11/24 9146   naproxen  (NAPROSYN ) tablet 250 mg  250 mg Oral BID WC [name], [name], MD   250 mg at 08/11/24 9146    OLANZapine  (ZYPREXA ) injection 5 mg  5 mg Intramuscular TID PRN Hampton, Tracie B, NP       OLANZapine  zydis (ZYPREXA ) disintegrating tablet 5 mg  5 mg Oral TID PRN Hampton, Tracie B, NP   5 mg at 08/04/24 1129   oxyCODONE  (Oxy IR/ROXICODONE ) immediate release tablet 5 mg  5 mg Oral Q4H PRN Dorinda Drue DASEN, MD   5 mg at 08/10/24 1336   pantoprazole  (PROTONIX ) EC tablet 40 mg  40 mg Oral Daily [name], [name], MD   40 mg at 08/11/24 9145   traZODone  (DESYREL ) tablet 50 mg  50 mg Oral QHS PRN Hampton, Tracie B, NP   50 mg at 08/10/24 2147   PTA Medications: Medications Prior to Admission  Medication Sig Dispense Refill Last Dose/Taking   amiodarone  (PACERONE ) 400 MG tablet Place 1 tablet (400 mg total) into feeding tube daily.      ARIPiprazole  (ABILIFY ) 30 MG tablet Place 1 tablet (30 mg total) into feeding tube at bedtime.      bisacodyl  (DULCOLAX) 10 MG suppository Place 1 suppository (10 mg total) rectally daily as needed for moderate constipation or severe constipation.      busPIRone  (BUSPAR ) 5 MG tablet Take 5 mg by mouth 3 (three) times daily.  Chlorhexidine  Gluconate Cloth 2 % PADS Apply 6 each topically daily.      digoxin  (LANOXIN ) 0.125 MG tablet 1 tablet (0.125 mg total) by Per NG tube route daily. (Patient taking differently: Take 0.125 mg by mouth every other day.)      docusate (COLACE) 50 MG/5ML liquid Place 10 mLs (100 mg total) into feeding tube 2 (two) times daily.      metoprolol  tartrate (LOPRESSOR ) 25 MG tablet Take 25 mg by mouth 2 (two) times daily.      pantoprazole  (PROTONIX ) 40 MG tablet Take 40 mg by mouth daily.      traMADol  (ULTRAM ) 50 MG tablet Take 50 mg by mouth 2 (two) times daily.       Patient Stressors: Educational concerns   Health problems   Medication change or noncompliance   Other: Stated transitional housing concerns    Patient Strengths: Manufacturing systems engineer  Religious Affiliation   Treatment Modalities: Medication Management, Group therapy,  Case management,  1 to 1 session with clinician, Psychoeducation, Recreational therapy.   Physician Treatment Plan for Primary Diagnosis: <principal problem not specified> Long Term Goal(s):     Short Term Goals:    Medication Management: Evaluate patient's response, side effects, and tolerance of medication regimen.  Therapeutic Interventions: 1 to 1 sessions, Unit Group sessions and Medication administration.  Evaluation of Outcomes: Progressing  Physician Treatment Plan for Secondary Diagnosis: Active Problems:   Schizophrenia, paranoid (HCC)   Dementia with behavioral disturbance (HCC)  Long Term Goal(s):     Short Term Goals:       Medication Management: Evaluate patient's response, side effects, and tolerance of medication regimen.  Therapeutic Interventions: 1 to 1 sessions, Unit Group sessions and Medication administration.  Evaluation of Outcomes: Progressing   RN Treatment Plan for Primary Diagnosis: <principal problem not specified> Long Term Goal(s): Knowledge of disease and therapeutic regimen to maintain health will improve  Short Term Goals: Ability to remain free from injury will improve, Ability to verbalize frustration and anger appropriately will improve, Ability to demonstrate self-control, Ability to participate in decision making will improve, Ability to verbalize feelings will improve, Ability to disclose and discuss suicidal ideas, Ability to identify and develop effective coping behaviors will improve, and Compliance with prescribed medications will improve   Medication Management: RN will administer medications as ordered by provider, will assess and evaluate patient's response and provide education to patient for prescribed medication. RN will report any adverse and/or side effects to prescribing provider.  Therapeutic Interventions: 1 on 1 counseling sessions, Psychoeducation, Medication administration, Evaluate responses to treatment, Monitor vital  signs and CBGs as ordered, Perform/monitor CIWA, COWS, AIMS and Fall Risk screenings as ordered, Perform wound care treatments as ordered.  Evaluation of Outcomes: Progressing   LCSW Treatment Plan for Primary Diagnosis: <principal problem not specified> Long Term Goal(s): Safe transition to appropriate next level of care at discharge, Engage patient in therapeutic group addressing interpersonal concerns.  Short Term Goals:  Engage patient in aftercare planning with referrals and resources, Increase social support, Increase ability to appropriately verbalize feelings, Increase emotional regulation, Facilitate acceptance of mental health diagnosis and concerns, Facilitate patient progression through stages of change regarding substance use diagnoses and concerns, Identify triggers associated with mental health/substance abuse issues, and Increase skills for wellness and recovery   Therapeutic Interventions: Assess for all discharge needs, 1 to 1 time with Social worker, Explore available resources and support systems, Assess for adequacy in community support network, Educate family and significant  other(s) on suicide prevention, Complete Psychosocial Assessment, Interpersonal group therapy.  Evaluation of Outcomes: Progressing   Progress in Treatment: Attending groups: Yes. and No. 08/11/24 Update: Yes. And No.  Participating in groups: Yes. and No. 08/11/24 Update: Yes. And No.  Taking medication as prescribed: Yes. 08/11/24 Update: Yes..  Toleration medication: Yes. 08/11/24 Update: Yes. And No.  Family/Significant other contact made: No, will contact:  CSW to contact once permission is granted. 08/11/24 Update: CSW contacted [employer].  Patient understands diagnosis: Yes. 08/11/24 Update: Yes. Discussing patient identified problems/goals with staff: Yes. 08/11/24 Update: Yes. Medical problems stabilized or resolved: Yes. 08/11/24 Update: Yes. Denies suicidal/homicidal ideation: Yes.  08/11/24 Update: Yes. Issues/concerns per patient self-inventory: No. 08/11/24 Update: No.  Other: None 08/11/24 Update: None.   New problem(s) identified: New problem(s) identified: No, Describe:  None Update 07/26/24: No changes at this time Update 08/01/24: No changes at this time  Update 08/06/24: No changes at this time 08/11/24 Update: No changes at this time.   New Short Term/Long Term Goal(s):detox, elimination of symptoms of psychosis, medication management for mood stabilization; elimination of SI thoughts; development of comprehensive mental wellness/sobriety plan.  Update 07/26/24: No changes at this time Update 08/01/24: No changes at this time Update 08/06/24: No changes at this time  10/13/23 Update: No changes at this time.    Patient Goals:  Make sure everything is settled. Update 07/26/24: No changes at this time Update 08/01/24: No changes at this time Update 08/06/24: No changes at this time Update 08/06/24: No changes at this time 08/11/24 Update: No changes at this time.   Discharge Plan or Barriers: CSW to assist with the development of appropriate discharge plan.  Update 07/26/24: No changes at this time Update 08/01/24: APS Report made for pt, screened it. Update 08/06/24: FL2 sent to [facility]. Pt is not placement. [facility] and APS to assist with placement. 08/11/24 Update: CSW to continue to support [facility] in working toward placement for patient.   Reason for Continuation of Hospitalization: Aggression Anxiety Delusions  Depression Hallucinations Suicidal ideation  Estimated Length of Stay: 1-7 days. Update 07/26/24: TBD Update 08/01/24: TBD Update 08/06/24: TBD 08/11/24 Update: TBD.   Last 3 [place] Suicide Severity Risk Score: Flowsheet Row Admission (Current) from 07/18/2024 in [facility] ED from 07/17/2024 in [facility] to Hosp-Admission (Discharged) from [facility]20/2024 [facility]  C-SSRS RISK CATEGORY No Risk No Risk No Risk    Last PHQ 2/9 Scores:     No data to display          Scribe for Treatment Team: Ferol Laiche M Keyshia Orwick, KEN 08/11/2024 2:46 PM

## 2024-08-11 NOTE — Progress Notes (Signed)
 Behavior:  Mild confusion.  Pleasant and cooperative.     Psych assessment: Denies SI/HI and AVH.  Interaction / Group attendance:  Present in the milieu.  Appropriate interaction with peers and staff.  Attends groups.  Medication/ PRNs: Compliant.  Pain: Denies.  15 min checks in place for safety.    Wound cleaned, measured and dressing changed by wound care RN's.

## 2024-08-11 NOTE — Group Note (Signed)
 Date:  08/11/2024 Time:  11:15 PM  Group Topic/Focus:  Wrap-Up Group:   The focus of this group is to help patients review their daily goal of treatment and discuss progress on daily workbooks.    Participation Level:  Did Not Attend  Participation Quality:     Affect:     Cognitive:     Insight: None  Engagement in Group:  None  Modes of Intervention:     Additional Comments:    Miguel [name] [name] 08/11/2024, 11:15 PM

## 2024-08-11 NOTE — Group Note (Signed)
 Recreation Therapy Group Note   Group Topic:General Recreation  Group Date: 08/11/2024 Start Time: 1405 End Time: 1450 Facilitators: Celestia Jeoffrey BRAVO, LRT, CTRS Location: Courtyard  Group Description: [employer]. LRT and patients played games of basketball, drew with chalk, and played corn hole while outside in the courtyard while getting fresh air and sunlight. Music was being played in the background. LRT and peers conversed about different games they have played before, what they do in their free time and anything else that is on their minds. LRT encouraged pts to drink water  after being outside, sweating and getting their heart rate up.  Goal Area(s) Addressed: Patient will build on frustration tolerance skills. Patients will partake in a competitive play game with peers. Patients will gain knowledge of new leisure interest/hobby.    Affect/Mood: N/A   Participation Level: Did not attend    Clinical Observations/Individualized Feedback: Patient did not attend group.  Plan: Continue to engage patient in RT group sessions 2-3x/week.   Jeoffrey BRAVO Celestia, LRT, CTRS 08/11/2024 4:37 PM

## 2024-08-11 NOTE — Group Note (Signed)
 Date:  08/11/2024 Time:  4:00 PM  Group Topic/Focus:  Healthy Communication:   The focus of this group is to discuss communication, barriers to communication, as well as healthy ways to communicate with others. Making Healthy Choices:   The focus of this group is to help patients identify negative/unhealthy choices they were using prior to admission and identify positive/healthier coping strategies to replace them upon discharge.  I provided structured worksheets designed to promote cognitive engagement, social interaction, and conversation. Worksheets included activities that encouraged critical thinking and verbal expression, such as This or That (Would You Rather) questions and prompts focused on favorite memories. The group reviewed the worksheet together, with questions read aloud to support comprehension and inclusion.  In addition to the worksheet activities, the group participated in Guess That Song, which encouraged memory recall, attention, and peer interaction. An acts of kindness discussion was also facilitated, allowing participants to reflect on positive behaviors and share personal experiences.  Participants demonstrated engagement through verbal responses, shared memories, and interaction with peers. The group setting promoted socialization, reminiscence, and positive affect. Modifications were provided as needed, including verbal prompting and allowing responses to be given verbally rather than written.  The session supported cognitive stimulation, emotional expression, and group cohesion in a supportive environment.    Participation Level:  Active  Participation Quality:  Appropriate  Affect:  Appropriate  Cognitive:  Appropriate  Insight: Appropriate  Engagement in Group:  Engaged  Modes of Intervention:  Activity and Discussion  Additional Comments:    Slayter Moorhouse L Ona Rathert 08/11/2024, 4:00 PM

## 2024-08-12 DIAGNOSIS — F03918 Unspecified dementia, unspecified severity, with other behavioral disturbance: Secondary | ICD-10-CM | POA: Diagnosis not present

## 2024-08-12 DIAGNOSIS — F200 Paranoid schizophrenia: Secondary | ICD-10-CM | POA: Diagnosis not present

## 2024-08-12 LAB — HEMOGLOBIN A1C
Hgb A1c MFr Bld: 5.4 % (ref 4.8–5.6)
Mean Plasma Glucose: 108.28 mg/dL

## 2024-08-12 LAB — LIPID PANEL
Cholesterol: 139 mg/dL (ref 0–200)
HDL: 52 mg/dL (ref >40)
LDL Cholesterol: 72 mg/dL (ref 0–99)
Total CHOL/HDL Ratio: 2.7 ratio
Triglycerides: 73 mg/dL (ref <150)
VLDL: 15 mg/dL (ref 0–40)

## 2024-08-12 NOTE — Group Note (Signed)
 Date:  08/12/2024 Time:  8:54 PM  Group Topic/Focus:  Wrap-Up Group:   The focus of this group is to help patients review their daily goal of treatment and discuss progress on daily workbooks.    Participation Level:  Active  Participation Quality:  Appropriate  Affect:  Appropriate  Cognitive:  Appropriate  Insight: Appropriate  Engagement in Group:  Engaged  Modes of Intervention:  Activity  Additional Comments:    [name] 08/12/2024, 8:54 PM

## 2024-08-12 NOTE — BHH Counselor (Signed)
 CSW facilitated meeting between DSS Social Worker,  Nia and patient. Social worker conducted meeting to assess patient's progression in treatment and engage in safe discharge planning. Social worker plans to continue to work towards placement on patient's behalf.   Social worker to continue to coordinate with CSW.   CSW team to continue to assess.   Cynthia Cogle, MSW, LCSWA 08/12/2024 9:36 AM

## 2024-08-12 NOTE — Plan of Care (Signed)
°  Problem: Coping: Goal: Ability to verbalize frustrations and anger appropriately will improve Outcome: Progressing   Problem: Education: Goal: Emotional status will improve Outcome: Progressing   Problem: Activity: Goal: Sleeping patterns will improve Outcome: Not Progressing   Problem: Safety: Goal: Periods of time without injury will increase Outcome: Progressing

## 2024-08-12 NOTE — Group Note (Signed)
 Date:  08/12/2024 Time:  10:54 AM  Group Topic/Focus:  Healthy Communication:   The focus of this group is to discuss communication, barriers to communication, as well as healthy ways to communicate with others.    Participation Level:  Active  Participation Quality:  Appropriate  Affect:  Appropriate  Cognitive:  Appropriate  Insight: Appropriate  Engagement in Group:  Engaged  Modes of Intervention:  Activity  Additional Comments:  N/A  Miguel [name] 08/12/2024, 10:54 AM

## 2024-08-12 NOTE — Plan of Care (Signed)
  Problem: Education: Goal: Knowledge of [place] [facility] Education information/materials will improve Outcome: Progressing   Problem: Activity: Goal: Interest or engagement in activities will improve Outcome: Progressing   Problem: Coping: Goal: Ability to verbalize frustrations and anger appropriately will improve Outcome: Progressing   Problem: Education: Goal: Knowledge of  General Education information/materials will improve Outcome: Progressing Goal: Emotional status will improve Outcome: Progressing Goal: Mental status will improve Outcome: Progressing Goal: Verbalization of understanding the information provided will improve Outcome: Progressing   Problem: Activity: Goal: Interest or engagement in activities will improve Outcome: Progressing Goal: Sleeping patterns will improve Outcome: Progressing   Problem: Coping: Goal: Ability to verbalize frustrations and anger appropriately will improve Outcome: Progressing Goal: Ability to demonstrate self-control will improve Outcome: Progressing   Problem: Health Behavior/Discharge Planning: Goal: Identification of resources available to assist in meeting health care needs will improve Outcome: Progressing Goal: Compliance with treatment plan for underlying cause of condition will improve Outcome: Progressing   Problem: Physical Regulation: Goal: Ability to maintain clinical measurements within normal limits will improve Outcome: Progressing   Problem: Safety: Goal: Periods of time without injury will increase Outcome: Progressing   Problem: Coping: Goal: Ability to adjust to condition or change in health will improve Outcome: Progressing   Problem: Health Behavior/Discharge Planning: Goal: Ability to identify and utilize available resources and services will improve Outcome: Progressing

## 2024-08-12 NOTE — Progress Notes (Signed)
 D- Patient alert and oriented x 2. Pt disoriented to time and situation. Pt presents with a pleasant mood and affect. Pt laughing and joking with staff. Pt denies SI, HI, AVH, and pain.   A- Scheduled medications administered to patient, per MD orders. Support and encouragement provided.  Routine safety checks conducted every 15 minutes.  Patient informed to notify staff with problems or concerns.  R- No adverse drug reactions noted. Patient contracts for safety at this time. Patient compliant with medications and treatment plan. Patient receptive, calm, and cooperative. Patient interacts well with others on the unit.  Patient remains safe at this time.    Sagrario Lineberry S.,RN

## 2024-08-12 NOTE — BHH Counselor (Signed)
 CSW attempted to touch base with Romero HUTCH coordinator with [facility] to follow up on [employer] placement and discharge planning.   Romero sent text that she will call CSW back.    Keirstyn Aydt, MSW, LCSWA 08/12/2024 11:51 AM

## 2024-08-12 NOTE — Progress Notes (Signed)
°   08/12/24 0200  Psych Admission Type (Psych Patients Only)  Admission Status Involuntary  Psychosocial Assessment  Patient Complaints Restlessness  Eye Contact Fair  Facial Expression Animated  Affect Preoccupied  Speech Tangential  Interaction Assertive  Motor Activity Slow  Appearance/Hygiene In scrubs  Behavior Characteristics Cooperative;Appropriate to situation  Mood Pleasant  Thought Process  Content Preoccupation  Delusions None reported or observed  Perception WDL  Hallucination None reported or observed  Judgment Impaired  Confusion Mild  Danger to Self  Current suicidal ideation? Denies  Danger to Others  Danger to Others None reported or observed

## 2024-08-12 NOTE — Plan of Care (Signed)
°  Problem: Coping: Goal: Ability to verbalize frustrations and anger appropriately will improve Outcome: Not Progressing   Problem: Education: Goal: Mental status will improve Outcome: Not Progressing Goal: Verbalization of understanding the information provided will improve Outcome: Not Progressing   Problem: Coping: Goal: Ability to demonstrate self-control will improve Outcome: Not Progressing

## 2024-08-12 NOTE — Group Note (Signed)
 Date:  08/12/2024 Time:  3:50 PM  Group Topic/Focus:  Emotional Education:   The focus of this group is to discuss what feelings/emotions are, and how they are experienced.    Participation Level:  Active  Participation Quality:  Appropriate  Affect:  Appropriate  Cognitive:  Appropriate  Insight: Appropriate  Engagement in Group:  Engaged  Modes of Intervention:  Discussion   Miguel [name] 08/12/2024, 3:50 PM

## 2024-08-12 NOTE — Progress Notes (Signed)
 Writer performed pt wound care. No complaints from pt during assessment, measurement, or cleaning. Wound dressing applied.   Keshonna Valvo S.,RN

## 2024-08-12 NOTE — Progress Notes (Signed)
 This clinical research associate asked [place] if this clinical research associate could change his dressing on his bottom.  He refused, and stated that he can get it changed later this afternoon.SABRAibuprofen just got it changed.  This clinical research associate explained that there is a Doctor's order, and he continued to refused.  He also refused to take a shower when asked several times throughout the shift.  He stated, I talk a shower everyday.   Continue POC as it is written.  Continue Q19m checks as per MD order.

## 2024-08-12 NOTE — Progress Notes (Signed)
°   08/12/24 2053  Psych Admission Type (Psych Patients Only)  Admission Status Involuntary  Psychosocial Assessment  Patient Complaints None  Eye Contact Other (Comment)  Facial Expression Other (Comment) (WDL)  Affect Preoccupied  Speech Tangential  Interaction Assertive  Motor Activity Slow  Appearance/Hygiene In scrubs  Behavior Characteristics Cooperative;Appropriate to situation  Mood Pleasant  Aggressive Behavior  Effect No apparent injury  Thought Process  Coherency Tangential  Content Preoccupation  Delusions None reported or observed  Perception WDL  Hallucination None reported or observed  Judgment Impaired  Confusion Mild  Danger to Self  Current suicidal ideation? Denies  Agreement Not to Harm Self Yes  Description of Agreement Verbal  Danger to Others  Danger to Others None reported or observed

## 2024-08-12 NOTE — Progress Notes (Signed)
 [facility] MD Progress Note  08/12/2024 12:11 PM Miguel [name]  MRN:  [id number]  Miguel [name] is a 73 year old male with a past psychiatric history significant for schizophrenia who presents under involuntary commitment initiated by family due to medication non-adherence, increasing paranoia, and behavioral disorganization. 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 Brother reports multiple episodes of patient whispering through the night, stating people were talking about him and planning to harm him. Patient was advised by his ACT Team to come to the ED; he refused, prompting the family to pursue IVC.Patient is admitted to [facility] unit with Q15 min safety monitoring. Multidisciplinary team approach is offered. Medication management; group/milieu therapy is offered.  Subjective:  Chart reviewed, case discussed in multidisciplinary meeting, patient seen during rounds.  Patient is noted to be walking in the day area.  He reports that he had some episodes where he felt unstable on his feet but denies having any falls.  Per nursing patient remains very confused and disorganized.  His room was malodorous.  Nursing informed the provider that patient is noted to be pooping on the floor at times and his bathroom has his clothes clogged up.  Patient is able to recognize this provider and reports that he feels better talking to the provider.  He is not endorsing SI/HI/plan and is not responding to stimuli.  He remains paranoid intermittently with some staff members.  He is taking his medications  with a lot of prompting. Past Psychiatric History: see h&P Family History:  Family History  Family history unknown: Yes   Social History:  Social History   Substance and Sexual Activity  Alcohol Use No     Social History   Substance and Sexual Activity  Drug Use No    Social History   Socioeconomic History   Marital status: Divorced    Spouse name: Not on file   Number of children: Not on file   Years of education: Not on file   Highest education level: Not on file  Occupational History   Not on file  Tobacco Use   Smoking status: Every Day    Current packs/day: 0.50    Average packs/day: 0.5 packs/day for 46.0 years (23.0 ttl pk-yrs)    Types: Cigarettes    Start date: 105   Smokeless tobacco: Never  Vaping Use   Vaping status: Never Used  Substance and Sexual Activity   Alcohol use: No   Drug use: No   Sexual activity: Not on file  Other Topics Concern   Not on file  Social History Narrative   Not on file   Social Drivers of Health   Tobacco Use: High Risk (07/18/2024)   Patient History    Smoking Tobacco Use: Every Day    Smokeless Tobacco Use: Never    Passive Exposure: Not on file  Financial Resource Strain: Not on file  Food Insecurity: Patient Unable To Answer (07/18/2024)   Epic    Worried About Programme Researcher, Broadcasting/film/video in the Last Year: Patient unable to answer    Ran Out of Food in the Last Year: Patient unable to answer  Transportation Needs: Unmet Transportation Needs (07/18/2024)   Epic  Lack of Transportation (Medical): Yes    Lack of Transportation (Non-Medical): Yes  Physical Activity: Not on file  Stress: Not on file  Social Connections: Patient Unable To Answer (07/18/2024)   Social Connection and Isolation Panel    Frequency of Communication with Friends and Family: Patient unable to answer    Frequency of Social Gatherings with Friends and Family: Patient unable to answer    Attends Religious Services: Patient unable to answer     Active Member of Clubs or Organizations: Patient unable to answer    Attends Banker Meetings: Patient unable to answer    Marital Status: Patient unable to answer  Depression (PHQ2-9): Not on file  Alcohol Screen: Low Risk (07/18/2024)   Alcohol Screen    Last Alcohol Screening Score (AUDIT): 0  Housing: Unknown (07/18/2024)   Epic    Unable to Pay for Housing in the Last Year: Patient unable to answer    Number of Times Moved in the Last Year: 0    Homeless in the Last Year: Patient declined  Utilities: Patient Unable To Answer (07/18/2024)   Epic    Threatened with loss of utilities: Patient unable to answer  Health Literacy: Not on file   Past Medical History:  Past Medical History:  Diagnosis Date   Biventricular failure (HCC)    CVA (cerebral vascular accident) (HCC)    Polysubstance use disorder    Schizophrenia (HCC)     Past Surgical History:  Procedure Laterality Date   APPENDECTOMY     TRACHEOSTOMY TUBE PLACEMENT N/A 03/04/2024   Procedure: CREATION, TRACHEOSTOMY;  Surgeon: [name] Massie RAMAN, MD;  Location: [facility] ORS;  Service: ENT;  Laterality: N/A;    Current Medications: Current Facility-Administered Medications  Medication Dose Route Frequency Provider Last Rate Last Admin   acetaminophen  (TYLENOL ) tablet 650 mg  650 mg Oral Q6H PRN Hampton, Tracie B, NP   650 mg at 08/04/24 2147   alum & mag hydroxide-simeth (MAALOX/MYLANTA) 200-200-20 MG/5ML suspension 30 mL  30 mL Oral Q4H PRN Hampton, Tracie B, NP   30 mL at 08/05/24 0306   amiodarone  (PACERONE ) tablet 400 mg  400 mg Oral Daily [name], [name], MD   400 mg at 08/12/24 0830   ARIPiprazole  (ABILIFY ) tablet 10 mg  10 mg Oral Daily Alixis Harmon, MD   10 mg at 08/12/24 0830   ARIPiprazole  ER (ABILIFY  MAINTENA) injection 400 mg  400 mg Intramuscular Q28 days Russia Scheiderer, MD   400 mg at 07/27/24 9070   divalproex  (DEPAKOTE  ER) 24 hr tablet 1,000 mg  1,000 mg Oral QHS Regina Ganci, MD   1,000 mg at  08/11/24 2126   docusate sodium  (COLACE) capsule 100 mg  100 mg Oral Daily Bernyce Brimley, MD   100 mg at 08/12/24 0830   feeding supplement (ENSURE PLUS HIGH PROTEIN) liquid 237 mL  237 mL Oral TID BM [name], [name], MD   237 mL at 08/12/24 1000   hydrOXYzine  (ATARAX ) tablet 25 mg  25 mg Oral TID PRN Hampton, Tracie B, NP   25 mg at 08/09/24 2117   magnesium  hydroxide (MILK OF MAGNESIA) suspension 30 mL  30 mL Oral Daily PRN Hampton, Tracie B, NP   30 mL at 08/08/24 2233   memantine  (NAMENDA ) tablet 10 mg  10 mg Oral Daily Tramaine Snell, MD   10 mg at 08/12/24 0830   metoprolol  tartrate (LOPRESSOR ) tablet 25 mg  25 mg Oral BID Dorinda Drue DASEN, MD  25 mg at 08/12/24 0830   naproxen  (NAPROSYN ) tablet 250 mg  250 mg Oral BID WC Talaysia Pinheiro, MD   250 mg at 08/12/24 9165   OLANZapine  (ZYPREXA ) injection 5 mg  5 mg Intramuscular TID PRN Hampton, Tracie B, NP       OLANZapine  zydis (ZYPREXA ) disintegrating tablet 5 mg  5 mg Oral TID PRN Hampton, Tracie B, NP   5 mg at 08/04/24 1129   oxyCODONE  (Oxy IR/ROXICODONE ) immediate release tablet 5 mg  5 mg Oral Q4H PRN Dorinda Drue DASEN, MD   5 mg at 08/12/24 0451   pantoprazole  (PROTONIX ) EC tablet 40 mg  40 mg Oral Daily [name], [name], MD   40 mg at 08/12/24 0830   traZODone  (DESYREL ) tablet 50 mg  50 mg Oral QHS PRN Hampton, Tracie B, NP   50 mg at 08/11/24 2126    Lab Results:  Results for orders placed or performed during the hospital encounter of 07/18/24 (from the past 48 hours)  Lipid panel     Status: None   Collection Time: 08/12/24  8:47 AM  Result Value Ref Range   Cholesterol 139 0 - 200 mg/dL    Comment:        ATP III CLASSIFICATION:  <200     mg/dL   Desirable  799-760  mg/dL   Borderline High  >=759    mg/dL   High           Triglycerides 73 <150 mg/dL   HDL 52 >59 mg/dL   Total CHOL/HDL Ratio 2.7 RATIO   VLDL 15 0 - 40 mg/dL   LDL Cholesterol 72 0 - 99 mg/dL    Comment:        Total Cholesterol/HDL:CHD Risk Coronary  Heart Disease Risk Table                     Men   Women  1/2 Average Risk   3.4   3.3  Average Risk       5.0   4.4  2 X Average Risk   9.6   7.1  3 X Average Risk  23.4   11.0        Use the calculated Patient Ratio above and the CHD Risk Table to determine the patient's CHD Risk.        ATP III CLASSIFICATION (LDL):  <100     mg/dL   Optimal  899-870  mg/dL   Near or Above                    Optimal  130-159  mg/dL   Borderline  839-810  mg/dL   High  >809     mg/dL   Very High Performed at [facility], [address] [place], [place] [address]      Blood Alcohol level:  Lab Results  Component Value Date   [facility] <15 07/17/2024   ETH <15 02/20/2024    Metabolic Disorder Labs: Lab Results  Component Value Date   HGBA1C 5.8 (H) 07/14/2018   MPG 119.76 07/14/2018   MPG 131.24 07/13/2018   No results found for: PROLACTIN Lab Results  Component Value Date   CHOL 139 08/12/2024   TRIG 73 08/12/2024   HDL 52 08/12/2024   CHOLHDL 2.7 08/12/2024   VLDL 15 08/12/2024   LDLCALC 72 08/12/2024   LDLCALC 119 (H) 07/13/2018    Physical Findings: AIMS:  , ,  ,  ,  CIWA:    COWS:      Psychiatric Specialty Exam:  Presentation  General Appearance: Appropriate for Environment  Eye Contact:Fleeting  Speech:Normal Rate  Speech Volume:Decreased    Mood and Affect  Mood:Anxious  Affect:Flat   Thought Process  Thought Processes:circumstantial Orientation:Partial  Thought Content:discharge focused  Hallucinations:denies  Ideas of Reference:Delusions At baseline Suicidal Thoughts:denies  Homicidal Thoughts:denies   Sensorium  Memory:Immediate Poor; Recent Poor; Remote Poor  Judgment:Impaired  Insight:None   Executive Functions  Concentration:Poor  Attention Span:Poor  Recall:Poor  Fund of Knowledge:Poor  Language:Fair   Psychomotor Activity  Psychomotor Activity:No data recorded  Musculoskeletal: Strength & Muscle  Tone: within normal limits Gait & Station: normal Assets  Assets:Communication Skills; Desire for Improvement; Social Support    Physical Exam: Physical Exam Vitals and nursing note reviewed.    ROS Blood pressure (!) 116/52, pulse (!) 59, temperature (!) 97.2 F (36.2 C), resp. rate 18, height 6' 1 (1.854 m), weight 81.2 kg, SpO2 100%. Body mass index is 23.62 kg/m.  Diagnosis: Active Problems:   Schizophrenia, paranoid (HCC)   Dementia with behavioral disturbance (HCC)   PLAN: Safety and Monitoring:  -- Involuntary admission to inpatient psychiatric unit for safety, stabilization and treatment  -- Daily contact with patient to assess and evaluate symptoms and progress in treatment  -- Patient's case to be discussed in multi-disciplinary team meeting  -- Observation Level : q15 minute checks  -- Vital signs:  q12 hours  -- Precautions: suicide, elopement, and assault -- Encouraged patient to participate in unit milieu and in scheduled group therapies  2. Psychiatric Treatment:  Scheduled Medications:  Added back Abilify  10 mg due to worsening agitation and paranoia even after LAI -Abilify  10 mg daily LAY Abilify  Maintena 400 mg q4 weeks given on 07/27/24  Cogentin  1mg  one dose given for EPS Depakote  ER increased to 1000 mg to help with mood stabilization as he was receiving multiple as needed antipsychotics for agitation -- The risks/benefits/side-effects/alternatives to this medication were discussed in detail with the patient and time was given for questions. The patient consents to medication trial.  3. Medical Issues Being Addressed:  [facility] consulted for wound care management   Assessment and Plan:   Sacral Decubitus Ulcer Full thickness decubitus ulcer in setting of recent extended hospitalization, discharged from [facility] 14th Wound looks pretty good overall WOC on board we appreciate input   Cognitive Deficit Concern for anoxic brain injury at discharge  02/2024 Delirium precautions   Peripheral Vascular Disease Legs with features c/w peripheral vascular disease, diminished DP pulses Would benefit from outpatient ABI's   HFrEF Euvolemic at this time Continue home medications   Hx Atrial Flutter Not on any anticoagulation at this time EKG with NSR Continue amiodarone   Hypertension Noted, adjust meds gradually after med rec complete     4. Discharge Planning:   -- Social work and case management to assist with discharge planning and identification of hospital follow-up needs prior to discharge  -- Estimated LOS: 3-4 days  Allyn Foil, MD 08/12/2024, 12:11 PM

## 2024-08-13 DIAGNOSIS — F200 Paranoid schizophrenia: Secondary | ICD-10-CM | POA: Diagnosis not present

## 2024-08-13 DIAGNOSIS — F03918 Unspecified dementia, unspecified severity, with other behavioral disturbance: Secondary | ICD-10-CM | POA: Diagnosis not present

## 2024-08-13 NOTE — BHH Group Notes (Signed)
 Spirituality Group   Group Goal: Support / Education around grief and loss   Group Description: Following introductions and group rules, group members engaged in facilitated group dialog and support around topic of loss, with particular support around experiences of loss in their lives. Group members identified types of loss (relationships / self / things) as well as patterns, circumstances, and changes that precipitate loss. Reflection invited on thoughts / feelings around loss, normalized grief responses, and recognized variety in grief experience. Group noted Worden's four tasks of grief in discussion. Group drew on Adlerian / Rogerian, narrative, MI, with [place] group therapy as a primary framework.   Observations: Miguel [name] was present for seond 0.5 of group and seemed quite drowsy so not able to participate as usual.  [name] HERO.Div

## 2024-08-13 NOTE — BHH Counselor (Signed)
 [name] [phone]; [email or web address]), the patients Guardian ad Litem (GAL), visited the patient. Mr. [name] reported that he was recently assigned to the case and needed to meet with the patient immediately. He stated that the guardianship petition was filed by the patients brother, [name]. Mr. [name] further reported that a court hearing for interim guardianship is scheduled for 08/17/24 at 2:00 PM. The petition is for guardianship of the person. Depending on the courts decision, the next step may be a competency hearing for full guardianship.   CSW to continue to assess.   [name], MSW, LCSWA 08/13/2024 3:43 PM

## 2024-08-13 NOTE — Group Note (Signed)
 Date:  08/13/2024 Time:  4:07 PM  Group Topic/Focus:  Overcoming Stress:   The focus of this group is to define stress and help patients assess their triggers.    Participation Level:  Active  Participation Quality:  Appropriate  Affect:  Appropriate  Cognitive:  Appropriate  Insight: Appropriate  Engagement in Group:  Engaged  Modes of Intervention:  Discussion   Arland Nutting 08/13/2024, 4:07 PM

## 2024-08-13 NOTE — Group Note (Signed)
 [facility] LCSW Group Therapy Note   Group Date: 08/13/2024 Start Time: 1300 End Time: 1400   Type of Therapy/Topic:  Group Therapy:  Balance in Life  Participation Level:  Active   Description of Group:    This group will address the concept of balance and how it feels and looks when one is unbalanced. Patients will be encouraged to process areas in their lives that are out of balance, and identify reasons for remaining unbalanced. Facilitators will guide patients utilizing problem- solving interventions to address and correct the stressor making their life unbalanced. Understanding and applying boundaries will be explored and addressed for obtaining  and maintaining a balanced life. Patients will be encouraged to explore ways to assertively make their unbalanced needs known to significant others in their lives, using other group members and facilitator for support and feedback.  Therapeutic Goals: Patient will identify two or more emotions or situations they have that consume much of in their lives. Patient will identify signs/triggers that life has become out of balance:  Patient will identify two ways to set boundaries in order to achieve balance in their lives:  Patient will demonstrate ability to communicate their needs through discussion and/or role plays  Summary of Patient Progress: The group participated in life review, with the facilitator guiding discussion through questions designed to encourage reflection on experiences that foster resilience. Both patients and facilitators engaged in an activity in which prompts were read aloud, and participants were invited to reflect on and share meaningful moments from specific periods in history and their own lives. The patient was willing to share, remained respectful of others, and contributed positively to the overall group dynamic.    Therapeutic Modalities:   Cognitive Behavioral Therapy Solution-Focused Therapy Assertiveness  Training   Alveta CHRISTELLA Kerns, LCSW

## 2024-08-13 NOTE — Progress Notes (Signed)
 [facility] MD Progress Note  08/13/2024 12:33 PM Miguel [name]  MRN:  [id number]  Miguel [name] is a 73 year old male with a past psychiatric history significant for schizophrenia who presents under involuntary commitment initiated by family due to medication non-adherence, increasing paranoia, and behavioral disorganization. 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 Brother reports multiple episodes of patient whispering through the night, stating people were talking about him and planning to harm him. Patient was advised by his ACT Team to come to the ED; he refused, prompting the family to pursue IVC.Patient is admitted to [facility] unit with Q15 min safety monitoring. Multidisciplinary team approach is offered. Medication management; group/milieu therapy is offered.  Subjective:  Chart reviewed, case discussed in multidisciplinary meeting, patient seen during rounds.  Patient is noted to be in the day area.  He informed the provider and complained that the staff are making a big deal out of cleanliness of his room.  He denies that he clogged up the toilet but per nursing report patient has put the wound dressing and put on the floor yesterday.  Per nursing patient is sleeping and eating well on the unit.  He is less agitated and he needs a lot of prompting to take his medication.  He denies SI/HI/plan. Past Psychiatric History: see h&P Family History:  Family History  Family history unknown: Yes   Social History:  Social History   Substance and Sexual Activity  Alcohol Use No      Social History   Substance and Sexual Activity  Drug Use No    Social History   Socioeconomic History   Marital status: Divorced    Spouse name: Not on file   Number of children: Not on file   Years of education: Not on file   Highest education level: Not on file  Occupational History   Not on file  Tobacco Use   Smoking status: Every Day    Current packs/day: 0.50    Average packs/day: 0.5 packs/day for 46.0 years (23.0 ttl pk-yrs)    Types: Cigarettes    Start date: 38   Smokeless tobacco: Never  Vaping Use   Vaping status: Never Used  Substance and Sexual Activity   Alcohol use: No   Drug use: No   Sexual activity: Not on file  Other Topics Concern   Not on file  Social History Narrative   Not on file   Social Drivers of Health   Tobacco Use: High Risk (07/18/2024)   Patient History    Smoking Tobacco Use: Every Day    Smokeless Tobacco Use: Never    Passive Exposure: Not on file  Financial Resource Strain: Not on file  Food Insecurity: Patient Unable To Answer (07/18/2024)   Epic    Worried About Programme Researcher, Broadcasting/film/video in the Last Year: Patient unable to answer    Ran Out of Food in the Last Year: Patient unable to answer  Transportation Needs: Unmet Transportation Needs (07/18/2024)   Epic    Lack of Transportation (Medical): Yes    Lack of Transportation (Non-Medical): Yes  Physical Activity: Not on file  Stress: Not on file  Social Connections: Patient Unable To  Answer (07/18/2024)   Social Connection and Isolation Panel    Frequency of Communication with Friends and Family: Patient unable to answer    Frequency of Social Gatherings with Friends and Family: Patient unable to answer    Attends Religious Services: Patient unable to answer    Active Member of Clubs or Organizations: Patient unable to answer    Attends Banker Meetings: Patient unable to answer    Marital Status: Patient unable to answer  Depression (PHQ2-9): Not on file   Alcohol Screen: Low Risk (07/18/2024)   Alcohol Screen    Last Alcohol Screening Score (AUDIT): 0  Housing: Unknown (07/18/2024)   Epic    Unable to Pay for Housing in the Last Year: Patient unable to answer    Number of Times Moved in the Last Year: 0    Homeless in the Last Year: Patient declined  Utilities: Patient Unable To Answer (07/18/2024)   Epic    Threatened with loss of utilities: Patient unable to answer  Health Literacy: Not on file   Past Medical History:  Past Medical History:  Diagnosis Date   Biventricular failure (HCC)    CVA (cerebral vascular accident) (HCC)    Polysubstance use disorder    Schizophrenia (HCC)     Past Surgical History:  Procedure Laterality Date   APPENDECTOMY     TRACHEOSTOMY TUBE PLACEMENT N/A 03/04/2024   Procedure: CREATION, TRACHEOSTOMY;  Surgeon: [name] Massie RAMAN, MD;  Location: [facility] ORS;  Service: ENT;  Laterality: N/A;    Current Medications: Current Facility-Administered Medications  Medication Dose Route Frequency Provider Last Rate Last Admin   acetaminophen  (TYLENOL ) tablet 650 mg  650 mg Oral Q6H PRN Hampton, Tracie B, NP   650 mg at 08/04/24 2147   alum & mag hydroxide-simeth (MAALOX/MYLANTA) 200-200-20 MG/5ML suspension 30 mL  30 mL Oral Q4H PRN Hampton, Tracie B, NP   30 mL at 08/05/24 0306   amiodarone  (PACERONE ) tablet 400 mg  400 mg Oral Daily [name], [name], MD   400 mg at 08/13/24 1025   ARIPiprazole  (ABILIFY ) tablet 10 mg  10 mg Oral Daily Adrielle Polakowski, MD   10 mg at 08/13/24 0913   ARIPiprazole  ER (ABILIFY  MAINTENA) injection 400 mg  400 mg Intramuscular Q28 days Zoran Yankee, MD   400 mg at 07/27/24 9070   divalproex  (DEPAKOTE  ER) 24 hr tablet 1,000 mg  1,000 mg Oral QHS Aislynn Cifelli, MD   1,000 mg at 08/12/24 2036   docusate sodium  (COLACE) capsule 100 mg  100 mg Oral Daily Deakin Lacek, MD   100 mg at 08/13/24 0914   feeding supplement (ENSURE PLUS HIGH PROTEIN) liquid 237 mL  237 mL Oral TID BM [name],  [name], MD   237 mL at 08/13/24 0913   hydrOXYzine  (ATARAX ) tablet 25 mg  25 mg Oral TID PRN Hampton, Tracie B, NP   25 mg at 08/09/24 2117   magnesium  hydroxide (MILK OF MAGNESIA) suspension 30 mL  30 mL Oral Daily PRN Hampton, Tracie B, NP   30 mL at 08/08/24 2233   memantine  (NAMENDA ) tablet 10 mg  10 mg Oral Daily Breeana Sawtelle, MD   10 mg at 08/13/24 1025   metoprolol  tartrate (LOPRESSOR ) tablet 25 mg  25 mg Oral BID [name], [name], MD   25 mg at 08/13/24 0914   naproxen  (NAPROSYN ) tablet 250 mg  250 mg Oral BID WC Somnang Mahan, MD   250 mg at 08/13/24 [phone]  OLANZapine  (ZYPREXA ) injection 5 mg  5 mg Intramuscular TID PRN Hampton, Tracie B, NP       OLANZapine  zydis (ZYPREXA ) disintegrating tablet 5 mg  5 mg Oral TID PRN Hampton, Tracie B, NP   5 mg at 08/04/24 1129   oxyCODONE  (Oxy IR/ROXICODONE ) immediate release tablet 5 mg  5 mg Oral Q4H PRN Dorinda Drue DASEN, MD   5 mg at 08/12/24 0451   pantoprazole  (PROTONIX ) EC tablet 40 mg  40 mg Oral Daily [name], [name], MD   40 mg at 08/13/24 9085   traZODone  (DESYREL ) tablet 50 mg  50 mg Oral QHS PRN Hampton, Tracie B, NP   50 mg at 08/12/24 2036    Lab Results:  Results for orders placed or performed during the hospital encounter of 07/18/24 (from the past 48 hours)  Hemoglobin A1c     Status: None   Collection Time: 08/12/24  8:47 AM  Result Value Ref Range   Hgb A1c MFr Bld 5.4 4.8 - 5.6 %    Comment: (NOTE) Diagnosis of Diabetes The following HbA1c ranges recommended by the American Diabetes Association (ADA) may be used as an aid in the diagnosis of diabetes mellitus.  Hemoglobin             Suggested A1C NGSP%              Diagnosis  <5.7                   Non Diabetic  5.7-6.4                Pre-Diabetic  >6.4                   Diabetic  <7.0                   Glycemic control for                       adults with diabetes.     Mean Plasma Glucose 108.28 mg/dL    Comment: Performed at [facility], [address] [place], [place] [address]  Lipid panel     Status: None   Collection Time: 08/12/24  8:47 AM  Result Value Ref Range   Cholesterol 139 0 - 200 mg/dL    Comment:        ATP III CLASSIFICATION:  <200     mg/dL   Desirable  799-760  mg/dL   Borderline High  >=759    mg/dL   High           Triglycerides 73 <150 mg/dL   HDL 52 >59 mg/dL   Total CHOL/HDL Ratio 2.7 RATIO   VLDL 15 0 - 40 mg/dL   LDL Cholesterol 72 0 - 99 mg/dL    Comment:        Total Cholesterol/HDL:CHD Risk Coronary Heart Disease Risk Table                     Men   Women  1/2 Average Risk   3.4   3.3  Average Risk       5.0   4.4  2 X Average Risk   9.6   7.1  3 X Average Risk  23.4   11.0        Use the calculated Patient Ratio above and the CHD Risk Table to determine the patient's CHD Risk.  ATP III CLASSIFICATION (LDL):  <100     mg/dL   Optimal  899-870  mg/dL   Near or Above                    Optimal  130-159  mg/dL   Borderline  839-810  mg/dL   High  >809     mg/dL   Very High Performed at [facility], [address] [place], [place] [address]      Blood Alcohol level:  Lab Results  Component Value Date   [facility] <15 07/17/2024   ETH <15 02/20/2024    Metabolic Disorder Labs: Lab Results  Component Value Date   HGBA1C 5.4 08/12/2024   MPG 108.28 08/12/2024   MPG 119.76 07/14/2018   No results found for: PROLACTIN Lab Results  Component Value Date   CHOL 139 08/12/2024   TRIG 73 08/12/2024   HDL 52 08/12/2024   CHOLHDL 2.7 08/12/2024   VLDL 15 08/12/2024   LDLCALC 72 08/12/2024   LDLCALC 119 (H) 07/13/2018    Physical Findings: AIMS:  , ,  ,  ,    CIWA:    COWS:      Psychiatric Specialty Exam:  Presentation  General Appearance: Appropriate for Environment  Eye Contact:Fleeting  Speech:Normal Rate  Speech Volume:Decreased    Mood and Affect  Mood:Anxious  Affect:Flat   Thought Process  Thought  Processes:circumstantial Orientation:Partial  Thought Content:discharge focused  Hallucinations:denies  Ideas of Reference:Delusions At baseline Suicidal Thoughts:denies  Homicidal Thoughts:denies   Sensorium  Memory:Immediate Poor; Recent Poor; Remote Poor  Judgment:Impaired  Insight:None   Executive Functions  Concentration:Poor  Attention Span:Poor  Recall:Poor  Fund of Knowledge:Poor  Language:Fair   Psychomotor Activity  Psychomotor Activity:No data recorded  Musculoskeletal: Strength & Muscle Tone: within normal limits Gait & Station: normal Assets  Assets:Communication Skills; Desire for Improvement; Social Support    Physical Exam: Physical Exam Vitals and nursing note reviewed.    ROS Blood pressure (!) 124/52, pulse (!) 56, temperature (!) 97.1 F (36.2 C), resp. rate 18, height 6' 1 (1.854 m), weight 81.2 kg, SpO2 96%. Body mass index is 23.62 kg/m.  Diagnosis: Active Problems:   Schizophrenia, paranoid (HCC)   Dementia with behavioral disturbance (HCC)   PLAN: Safety and Monitoring:  -- Involuntary admission to inpatient psychiatric unit for safety, stabilization and treatment  -- Daily contact with patient to assess and evaluate symptoms and progress in treatment  -- Patient's case to be discussed in multi-disciplinary team meeting  -- Observation Level : q15 minute checks  -- Vital signs:  q12 hours  -- Precautions: suicide, elopement, and assault -- Encouraged patient to participate in unit milieu and in scheduled group therapies  2. Psychiatric Treatment:  Scheduled Medications:  Added back Abilify  10 mg due to worsening agitation and paranoia even after LAI -Abilify  10 mg daily LAY Abilify  Maintena 400 mg q4 weeks given on 07/27/24  Cogentin  1mg  one dose given for EPS Depakote  ER increased to 1000 mg to help with mood stabilization as he was receiving multiple as needed antipsychotics for agitation -- The  risks/benefits/side-effects/alternatives to this medication were discussed in detail with the patient and time was given for questions. The patient consents to medication trial.  3. Medical Issues Being Addressed:  [facility] consulted for wound care management   Assessment and Plan:   Sacral Decubitus Ulcer Full thickness decubitus ulcer in setting of recent extended hospitalization, discharged from [facility] Nov 14th Wound looks  pretty good overall WOC on board we appreciate input   Cognitive Deficit Concern for anoxic brain injury at discharge 02/2024 Delirium precautions   Peripheral Vascular Disease Legs with features c/w peripheral vascular disease, diminished DP pulses Would benefit from outpatient ABI's   HFrEF Euvolemic at this time Continue home medications   Hx Atrial Flutter Not on any anticoagulation at this time EKG with NSR Continue amiodarone   Hypertension Noted, adjust meds gradually after med rec complete     4. Discharge Planning:   -- Social work and case management to assist with discharge planning and identification of hospital follow-up needs prior to discharge  -- Estimated LOS: 3-4 days  Rikia Sukhu, MD 08/13/2024, 12:33 PM

## 2024-08-13 NOTE — Plan of Care (Signed)
°  Problem: Education: Goal: Knowledge of [facility] Education information/materials will improve Outcome: Progressing   Problem: Activity: Goal: Interest or engagement in activities will improve Outcome: Progressing   Problem: Coping: Goal: Ability to verbalize frustrations and anger appropriately will improve Outcome: Progressing   Problem: Education: Goal: Knowledge of [place] Education information/materials will improve Outcome: Progressing Goal: Emotional status will improve Outcome: Progressing Goal: Mental status will improve Outcome: Progressing Goal: Verbalization of understanding the information provided will improve Outcome: Progressing   Problem: Activity: Goal: Interest or engagement in activities will improve Outcome: Progressing Goal: Sleeping patterns will improve Outcome: Progressing

## 2024-08-13 NOTE — Group Note (Signed)
 Recreation Therapy Group Note   Group Topic:Emotion Expression  Group Date: 08/13/2024 Start Time: 1400 End Time: 1435 Facilitators: Celestia Jeoffrey BRAVO, LRT, CTRS Location: Dayroom  Group Description: Expressive Higher Education Careers Adviser. Patients received a blank postcard template. LRT encouraged pt to create a postcard to themselves in their younger days with the knowledge and wisdom they have today. Pts are encouraged to share something positive or words of encouragement on their post cards using colored pencils and markers. Once finished, patients and LRT had a discussion on why they chose the words they did and what it means to them.  Goal Area(s) Addressed: Patient will increase communication skills.  Patient will reminisce a fond memory in their life.   Patient will practice healthy decision making. Patient will express their emotions in a positive way.    Affect/Mood: Asleep   Participation Level: Non-verbal    Clinical Observations/Individualized Feedback: Miguel [name] was asleep duration of  session.   Plan: Continue to engage patient in RT group sessions 2-3x/week.   Jeoffrey BRAVO Celestia, LRT, CTRS 08/13/2024 5:06 PM

## 2024-08-13 NOTE — BHH Counselor (Signed)
 CSW touched base with [facility], Romero who reported that [facility] and [facility] still have no available beds at this time. S[facility]RA Romero reported that they are still working on placement.   Emonie Espericueta, MSW, LCSWA 08/13/2024 3:09 PM

## 2024-08-14 DIAGNOSIS — F03918 Unspecified dementia, unspecified severity, with other behavioral disturbance: Secondary | ICD-10-CM | POA: Diagnosis not present

## 2024-08-14 DIAGNOSIS — F200 Paranoid schizophrenia: Secondary | ICD-10-CM | POA: Diagnosis not present

## 2024-08-14 NOTE — Plan of Care (Signed)
  Problem: Education: Goal: Knowledge of [place] [facility] Education information/materials will improve Outcome: Progressing   Problem: Activity: Goal: Interest or engagement in activities will improve Outcome: Progressing   Problem: Coping: Goal: Ability to verbalize frustrations and anger appropriately will improve Outcome: Progressing   Problem: Education: Goal: Knowledge of  General Education information/materials will improve Outcome: Progressing Goal: Emotional status will improve Outcome: Progressing Goal: Mental status will improve Outcome: Progressing Goal: Verbalization of understanding the information provided will improve Outcome: Progressing   Problem: Activity: Goal: Interest or engagement in activities will improve Outcome: Progressing Goal: Sleeping patterns will improve Outcome: Progressing   Problem: Coping: Goal: Ability to verbalize frustrations and anger appropriately will improve Outcome: Progressing Goal: Ability to demonstrate self-control will improve Outcome: Progressing   Problem: Health Behavior/Discharge Planning: Goal: Identification of resources available to assist in meeting health care needs will improve Outcome: Progressing Goal: Compliance with treatment plan for underlying cause of condition will improve Outcome: Progressing   Problem: Physical Regulation: Goal: Ability to maintain clinical measurements within normal limits will improve Outcome: Progressing   Problem: Safety: Goal: Periods of time without injury will increase Outcome: Progressing   Problem: Coping: Goal: Ability to adjust to condition or change in health will improve Outcome: Progressing   Problem: Health Behavior/Discharge Planning: Goal: Ability to identify and utilize available resources and services will improve Outcome: Progressing

## 2024-08-14 NOTE — Group Note (Signed)
 Date:  08/14/2024 Time:  10:49 AM  Group Topic/Focus:  Stages of Change:   The focus of this group is to explain the stages of change and help patients identify changes they want to make upon discharge.    Participation Level:  Did Not Attend  Participation Quality:     Affect:     Cognitive:     Insight:   Engagement in Group:    Modes of Intervention:    Additional Comments:    Miguel [name] 08/14/2024, 10:49 AM

## 2024-08-14 NOTE — Progress Notes (Signed)
" °   08/14/24 1400  Psych Admission Type (Psych Patients Only)  Admission Status Involuntary  Psychosocial Assessment  Patient Complaints None  Eye Contact Brief  Facial Expression Flat  Affect Preoccupied  Speech Tangential  Interaction Minimal;No initiation  Motor Activity Slow  Appearance/Hygiene In scrubs  Behavior Characteristics Pacing  Mood Labile  Thought Process  Coherency Tangential;Disorganized  Content Preoccupation  Delusions None reported or observed  Perception Derealization  Hallucination None reported or observed  Judgment Impaired  Confusion Mild  Danger to Self  Current suicidal ideation? Denies (Denies)  Agreement Not to Harm Self Yes  Description of Agreement verbal  Danger to Others  Danger to Others None reported or observed    "

## 2024-08-14 NOTE — Plan of Care (Signed)
  Problem: Activity: Goal: Interest or engagement in activities will improve Outcome: Progressing   Problem: Coping: Goal: Ability to verbalize frustrations and anger appropriately will improve Outcome: Progressing   Problem: Education: Goal: Knowledge of [facility] Education information/materials will improve Outcome: Progressing

## 2024-08-14 NOTE — Progress Notes (Signed)
 [facility] MD Progress Note  08/14/2024 12:25 PM KHANI PAINO  MRN:  [id number]  Jarick Harkins. Miguel [name] is a 73 year old male with a past psychiatric history significant for schizophrenia who presents under involuntary commitment initiated by family due to medication non-adherence, increasing paranoia, and behavioral disorganization. 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 Brother reports multiple episodes of patient whispering through the night, stating people were talking about him and planning to harm him. Patient was advised by his ACT Team to come to the ED; he refused, prompting the family to pursue IVC.Patient is admitted to [facility] unit with Q15 min safety monitoring. Multidisciplinary team approach is offered. Medication management; group/milieu therapy is offered.  Subjective:  Chart reviewed, case discussed in multidisciplinary meeting, patient seen during rounds.  Today on interview patient is noted to be sitting in the day area and dozing off.  He did acknowledge that he is feeling more tired than usual and inform the provider that he slept only for a few hours last night.  He remains confused and is unable to explain why he was up last night.  He denies having any worries or feeling sad or depressed.  He is not endorsing any active SI/HI/plan.  Patient is not responding to any internal stimuli but remains delusional and paranoid about staff members taking his stuff away from the room.  He is taking his medications after encouragement from the staff member. Past Psychiatric  History: see h&P Family History:  Family History  Family history unknown: Yes   Social History:  Social History   Substance and Sexual Activity  Alcohol Use No     Social History   Substance and Sexual Activity  Drug Use No    Social History   Socioeconomic History   Marital status: Divorced    Spouse name: Not on file   Number of children: Not on file   Years of education: Not on file   Highest education level: Not on file  Occupational History   Not on file  Tobacco Use   Smoking status: Every Day    Current packs/day: 0.50    Average packs/day: 0.5 packs/day for 46.0 years (23.0 ttl pk-yrs)    Types: Cigarettes    Start date: 44   Smokeless tobacco: Never  Vaping Use   Vaping status: Never Used  Substance and Sexual Activity   Alcohol use: No   Drug use: No   Sexual activity: Not on file  Other Topics Concern   Not on file  Social History Narrative   Not on file   Social Drivers of Health   Tobacco Use: High Risk (07/18/2024)   Patient History    Smoking Tobacco Use: Every Day    Smokeless Tobacco Use: Never    Passive Exposure: Not on file  Financial Resource Strain: Not on file  Food Insecurity: Patient Unable To Answer (07/18/2024)   Epic    Worried About Programme Researcher, Broadcasting/film/video in the Last Year: Patient unable to answer    Ran Out of Food in the Last Year: Patient unable to answer  Transportation Needs: Unmet Transportation Needs (07/18/2024)   Epic    Lack of Transportation (Medical): Yes  Lack of Transportation (Non-Medical): Yes  Physical Activity: Not on file  Stress: Not on file  Social Connections: Patient Unable To Answer (07/18/2024)   Social Connection and Isolation Panel    Frequency of Communication with Friends and Family: Patient unable to answer    Frequency of Social Gatherings with Friends and Family: Patient unable to answer    Attends Religious Services: Patient unable to answer    Active Member of Clubs or Organizations:  Patient unable to answer    Attends Banker Meetings: Patient unable to answer    Marital Status: Patient unable to answer  Depression (PHQ2-9): Not on file  Alcohol Screen: Low Risk (07/18/2024)   Alcohol Screen    Last Alcohol Screening Score (AUDIT): 0  Housing: Unknown (07/18/2024)   Epic    Unable to Pay for Housing in the Last Year: Patient unable to answer    Number of Times Moved in the Last Year: 0    Homeless in the Last Year: Patient declined  Utilities: Patient Unable To Answer (07/18/2024)   Epic    Threatened with loss of utilities: Patient unable to answer  Health Literacy: Not on file   Past Medical History:  Past Medical History:  Diagnosis Date   Biventricular failure (HCC)    CVA (cerebral vascular accident) (HCC)    Polysubstance use disorder    Schizophrenia (HCC)     Past Surgical History:  Procedure Laterality Date   APPENDECTOMY     TRACHEOSTOMY TUBE PLACEMENT N/A 03/04/2024   Procedure: CREATION, TRACHEOSTOMY;  Surgeon: [name] Massie RAMAN, MD;  Location: [facility] ORS;  Service: ENT;  Laterality: N/A;    Current Medications: Current Facility-Administered Medications  Medication Dose Route Frequency Provider Last Rate Last Admin   acetaminophen  (TYLENOL ) tablet 650 mg  650 mg Oral Q6H PRN Hampton, Tracie B, NP   650 mg at 08/14/24 0239   alum & mag hydroxide-simeth (MAALOX/MYLANTA) 200-200-20 MG/5ML suspension 30 mL  30 mL Oral Q4H PRN Hampton, Tracie B, NP   30 mL at 08/05/24 0306   amiodarone  (PACERONE ) tablet 400 mg  400 mg Oral Daily [name], [name], MD   400 mg at 08/14/24 9041   ARIPiprazole  (ABILIFY ) tablet 10 mg  10 mg Oral Daily Xolani Degracia, MD   10 mg at 08/14/24 0932   ARIPiprazole  ER (ABILIFY  MAINTENA) injection 400 mg  400 mg Intramuscular Q28 days Neyra Pettie, MD   400 mg at 07/27/24 9070   divalproex  (DEPAKOTE  ER) 24 hr tablet 1,000 mg  1,000 mg Oral QHS Feleshia Zundel, MD   1,000 mg at 08/13/24 2117   docusate sodium  (COLACE)  capsule 100 mg  100 mg Oral Daily Carmelle Bamberg, MD   100 mg at 08/14/24 0932   feeding supplement (ENSURE PLUS HIGH PROTEIN) liquid 237 mL  237 mL Oral TID BM [name], [name], MD   237 mL at 08/14/24 0933   hydrOXYzine  (ATARAX ) tablet 25 mg  25 mg Oral TID PRN Hampton, Tracie B, NP   25 mg at 08/09/24 2117   magnesium  hydroxide (MILK OF MAGNESIA) suspension 30 mL  30 mL Oral Daily PRN Hampton, Tracie B, NP   30 mL at 08/08/24 2233   memantine  (NAMENDA ) tablet 10 mg  10 mg Oral Daily Dahna Hattabaugh, MD   10 mg at 08/14/24 9066   metoprolol  tartrate (LOPRESSOR ) tablet 25 mg  25 mg Oral BID [name], [name], MD   25 mg at 08/14/24 [phone]  naproxen  (NAPROSYN ) tablet 250 mg  250 mg Oral BID WC Evanny Ellerbe, MD   250 mg at 08/14/24 0857   OLANZapine  (ZYPREXA ) injection 5 mg  5 mg Intramuscular TID PRN Hampton, Tracie B, NP       OLANZapine  zydis (ZYPREXA ) disintegrating tablet 5 mg  5 mg Oral TID PRN Hampton, Tracie B, NP   5 mg at 08/04/24 1129   oxyCODONE  (Oxy IR/ROXICODONE ) immediate release tablet 5 mg  5 mg Oral Q4H PRN Dorinda Homans T, MD   5 mg at 08/12/24 0451   pantoprazole  (PROTONIX ) EC tablet 40 mg  40 mg Oral Daily [name], [name], MD   40 mg at 08/14/24 0931   traZODone  (DESYREL ) tablet 50 mg  50 mg Oral QHS PRN Hampton, Tracie B, NP   50 mg at 08/13/24 2117    Lab Results:  No results found for this or any previous visit (from the past 48 hours).    Blood Alcohol level:  Lab Results  Component Value Date   [facility] <15 07/17/2024   ETH <15 02/20/2024    Metabolic Disorder Labs: Lab Results  Component Value Date   HGBA1C 5.4 08/12/2024   MPG 108.28 08/12/2024   MPG 119.76 07/14/2018   No results found for: PROLACTIN Lab Results  Component Value Date   CHOL 139 08/12/2024   TRIG 73 08/12/2024   HDL 52 08/12/2024   CHOLHDL 2.7 08/12/2024   VLDL 15 08/12/2024   LDLCALC 72 08/12/2024   LDLCALC 119 (H) 07/13/2018    Physical Findings: AIMS:  , ,  ,  ,    CIWA:     COWS:      Psychiatric Specialty Exam:  Presentation  General Appearance: Appropriate for Environment  Eye Contact:Fleeting  Speech:Normal Rate  Speech Volume:Decreased    Mood and Affect  Mood:Anxious  Affect:Flat   Thought Process  Thought Processes:circumstantial Orientation:Partial  Thought Content:discharge focused  Hallucinations:denies  Ideas of Reference:Delusions At baseline Suicidal Thoughts:denies  Homicidal Thoughts:denies   Sensorium  Memory:Immediate Poor; Recent Poor; Remote Poor  Judgment:Impaired  Insight:None   Executive Functions  Concentration:Poor  Attention Span:Poor  Recall:Poor  Fund of Knowledge:Poor  Language:Fair   Psychomotor Activity  Psychomotor Activity:No data recorded  Musculoskeletal: Strength & Muscle Tone: within normal limits Gait & Station: normal Assets  Assets:Communication Skills; Desire for Improvement; Social Support    Physical Exam: Physical Exam Vitals and nursing note reviewed.    ROS Blood pressure 130/67, pulse 66, temperature (!) 97.3 F (36.3 C), temperature source Tympanic, resp. rate 18, height 6' 1 (1.854 m), weight 81.2 kg, SpO2 100%. Body mass index is 23.62 kg/m.  Diagnosis: Active Problems:   Schizophrenia, paranoid (HCC)   Dementia with behavioral disturbance (HCC)   PLAN: Safety and Monitoring:  -- Involuntary admission to inpatient psychiatric unit for safety, stabilization and treatment  -- Daily contact with patient to assess and evaluate symptoms and progress in treatment  -- Patient's case to be discussed in multi-disciplinary team meeting  -- Observation Level : q15 minute checks  -- Vital signs:  q12 hours  -- Precautions: suicide, elopement, and assault -- Encouraged patient to participate in unit milieu and in scheduled group therapies  2. Psychiatric Treatment:  Scheduled Medications:   Abilify  10 mg due to worsening agitation and paranoia even after  LAI -Abilify  10 mg daily LAY Abilify  Maintena 400 mg q4 weeks given on 07/27/24  Cogentin  1mg  one dose given for EPS Depakote  ER increased to 1000  mg to help with mood stabilization as he was receiving multiple as needed antipsychotics for agitation  -- The risks/benefits/side-effects/alternatives to this medication were discussed in detail with the patient and time was given for questions. The patient consents to medication trial.  3. Medical Issues Being Addressed:  [facility] consulted for wound care management   Assessment and Plan:   Sacral Decubitus Ulcer Full thickness decubitus ulcer in setting of recent extended hospitalization, discharged from [facility] 14[facility] Wound looks pretty good overall WOC on board we appreciate input   Cognitive Deficit Concern for anoxic brain injury at discharge 02/2024 Delirium precautions   Peripheral Vascular Disease Legs with features c/w peripheral vascular disease, diminished DP pulses Would benefit from outpatient ABI's   HFrEF Euvolemic at this time Continue home medications   Hx Atrial Flutter Not on any anticoagulation at this time EKG with NSR Continue amiodarone   Hypertension Noted, adjust meds gradually after med rec complete     4. Discharge Planning:   -- Social work and case management to assist with discharge planning and identification of hospital follow-up needs prior to discharge  -- Estimated LOS: 3-4 days  Allyn Foil, MD 08/14/2024, 12:25 PM

## 2024-08-14 NOTE — Group Note (Signed)
 Date:  08/14/2024 Time:  10:28 PM  Group Topic/Focus:  Self Care:   The focus of this group is to help patients understand the importance of self-care in order to improve or restore emotional, physical, spiritual, interpersonal, and financial health.    Participation Level:  Did Not Attend  Participation Quality:  Did Not Attend  Affect:  Did Not Attend  Cognitive:  Did Not Attend  Insight: None  Engagement in Group:  None  Modes of Intervention:  Orientation  Additional Comments:    Laymon ONEIDA Finder 08/14/2024, 10:28 PM

## 2024-08-14 NOTE — Group Note (Signed)
 Date:  08/14/2024 Time:  3:35 PM  Group Topic/Focus:  Movement Therapy, Getting fit with Yazaira Speas.    Participation Level:  Did Not Attend    Miguel [name] 08/14/2024, 3:35 PM

## 2024-08-14 NOTE — Group Note (Signed)
 Recreation Therapy Group Note   Group Topic:Communication  Group Date: 08/14/2024 Start Time: 1405 End Time: 1440 Facilitators: Celestia Jeoffrey BRAVO, LRT, CTRS Location: Courtyard  Group Description: Music. Patients are encouraged to name their favorite song(s) for LRT to play song through speaker for group to hear, while in the courtyard getting fresh air and sunlight. Patients educated on the definition of leisure and the importance of having different leisure interests outside of the hospital. Group discussed how leisure activities can often be used as pharmacologist and that listening to music and being outside are examples.    Goal Area(s) Addressed:  Patient will identify a current leisure interest.  Patient will practice making a positive decision. Patient will have the opportunity to try a new leisure activity.   Affect/Mood: N/A   Participation Level: Did not attend    Clinical Observations/Individualized Feedback: Patient did not attend.  Plan: Continue to engage patient in RT group sessions 2-3x/week.   Jeoffrey BRAVO Celestia, LRT, CTRS 08/14/2024 4:56 PM

## 2024-08-14 NOTE — Plan of Care (Signed)
" °  Problem: Education: Goal: Knowledge of [facility] Education information/materials will improve 08/14/2024 1948 by Geneva Steward CROME, RN Outcome: Progressing 08/14/2024 0646 by Geneva Steward CROME, RN Outcome: Progressing   Problem: Activity: Goal: Interest or engagement in activities will improve 08/14/2024 1948 by Geneva Steward CROME, RN Outcome: Progressing 08/14/2024 0646 by Geneva Steward CROME, RN Outcome: Progressing   Problem: Coping: Goal: Ability to verbalize frustrations and anger appropriately will improve 08/14/2024 1948 by Geneva Steward CROME, RN Outcome: Progressing 08/14/2024 0646 by Geneva Steward CROME, RN Outcome: Progressing   Problem: Education: Goal: Knowledge of [facility] Education information/materials will improve 08/14/2024 1948 by Geneva Steward CROME, RN Outcome: Progressing 08/14/2024 0646 by Geneva Steward CROME, RN Outcome: Progressing Goal: Emotional status will improve 08/14/2024 1948 by Geneva Steward CROME, RN Outcome: Progressing 08/14/2024 0646 by Geneva Steward CROME, RN Outcome: Progressing Goal: Mental status will improve 08/14/2024 1948 by Geneva Steward CROME, RN Outcome: Progressing 08/14/2024 0646 by Geneva Steward CROME, RN Outcome: Progressing Goal: Verbalization of understanding the information provided will improve 08/14/2024 1948 by Geneva Steward CROME, RN Outcome: Progressing 08/14/2024 0646 by Geneva Steward CROME, RN Outcome: Progressing   Problem: Activity: Goal: Interest or engagement in activities will improve 08/14/2024 1948 by Geneva Steward CROME, RN Outcome: Progressing 08/14/2024 0646 by Geneva Steward CROME, RN Outcome: Progressing Goal: Sleeping patterns will improve 08/14/2024 1948 by Geneva Steward CROME, RN Outcome: Progressing 08/14/2024 0646 by Geneva Steward CROME, RN Outcome: Progressing   Problem: Coping: Goal: Ability to verbalize frustrations and anger appropriately will improve 08/14/2024 1948 by Geneva Steward CROME, RN Outcome: Progressing 08/14/2024  0646 by Geneva Steward CROME, RN Outcome: Progressing Goal: Ability to demonstrate self-control will improve 08/14/2024 1948 by Geneva Steward CROME, RN Outcome: Progressing 08/14/2024 0646 by Geneva Steward CROME, RN Outcome: Progressing   Problem: Health Behavior/Discharge Planning: Goal: Identification of resources available to assist in meeting health care needs will improve 08/14/2024 1948 by Geneva Steward CROME, RN Outcome: Progressing 08/14/2024 0646 by Geneva Steward CROME, RN Outcome: Progressing Goal: Compliance with treatment plan for underlying cause of condition will improve 08/14/2024 1948 by Geneva Steward CROME, RN Outcome: Progressing 08/14/2024 0646 by Geneva Steward CROME, RN Outcome: Progressing   Problem: Physical Regulation: Goal: Ability to maintain clinical measurements within normal limits will improve 08/14/2024 1948 by Geneva Steward CROME, RN Outcome: Progressing 08/14/2024 0646 by Geneva Steward CROME, RN Outcome: Progressing   Problem: Safety: Goal: Periods of time without injury will increase 08/14/2024 1948 by Geneva Steward CROME, RN Outcome: Progressing 08/14/2024 0646 by Geneva Steward CROME, RN Outcome: Progressing   Problem: Coping: Goal: Ability to adjust to condition or change in health will improve 08/14/2024 1948 by Geneva Steward CROME, RN Outcome: Progressing 08/14/2024 0646 by Geneva Steward CROME, RN Outcome: Progressing   Problem: Health Behavior/Discharge Planning: Goal: Ability to identify and utilize available resources and services will improve 08/14/2024 1948 by Geneva Steward CROME, RN Outcome: Progressing 08/14/2024 0646 by Geneva Steward CROME, RN Outcome: Progressing   "

## 2024-08-14 NOTE — Group Note (Signed)
 Physical/Occupational Therapy Group Note  Group Topic: Pain Management and Coping   Group Date: 08/14/2024 Start Time: 1300 End Time: 1400 Facilitators: Clive Warren CROME, OT   Group Description: Group discussed impact of chronic/acute pain on safety and independence with functional tasks and impact on mental health.  Identified and discussed any previously learned or implemented strategies used.  Discussed and reviewed cognitive behavioral pain coping strategies to address/improve overall management of pain. Discussed relaxation, distraction techniques, cognitive restructuring, activity pacing/energy conservation, environment/home safety modifications, and role of sleep and sleep hygiene. Allowed time for questions and further discussion.  Therapeutic Goal(s):  Identify and discuss previously utilized pain coping strategies and implications of pain on function/well-being Identify and discuss implementing new cognitive behavioral pain coping strategies into daily routines Demonstrate understanding and performance of learned cognitive behavioral pain coping strategies  Individual Participation: Pt did not attend.  Participation Level: Did not attend   Participation Quality:   Behavior:   Speech/Thought Process:   Affect/Mood:   Insight:   Judgement:   Modes of Intervention:   Patient Response to Interventions:    Plan: Continue to engage patient in PT/OT groups 1 - 2x/week.  Twala Collings R., MPH, MS, OTR/L ascom [phone] 08/14/2024, 3:16 PM

## 2024-08-14 NOTE — Progress Notes (Signed)
" °   08/13/24 2000  Psych Admission Type (Psych Patients Only)  Admission Status Involuntary  Psychosocial Assessment  Patient Complaints None  Eye Contact Brief  Facial Expression Flat  Affect Preoccupied  Speech Tangential  Interaction Assertive  Motor Activity Slow  Appearance/Hygiene In scrubs  Behavior Characteristics Appropriate to situation  Mood Pleasant  Thought Process  Coherency Tangential;Disorganized  Content Preoccupation  Delusions None reported or observed  Perception WDL  Hallucination None reported or observed  Judgment Limited  Confusion Mild  Danger to Self  Current suicidal ideation? Denies  Agreement Not to Harm Self Yes  Description of Agreement verbal  Danger to Others  Danger to Others None reported or observed   Mood/Behavior:  Pleasant and cooperative. Bight.    Psych assessment: Denies SI/HI and AVH.     Interaction / Group attendance:  Present in the milieu. Interacting with peers and staff.  Attended group.   Medication/ PRNs: Compliant with scheduled medications. Required PRNs Trazodone  for sleep and noted effective.Required prn Tylenol  for pain noted effective.   Pain: Generalized   15 min checks in place for safety. "

## 2024-08-15 DIAGNOSIS — F03918 Unspecified dementia, unspecified severity, with other behavioral disturbance: Secondary | ICD-10-CM | POA: Diagnosis not present

## 2024-08-15 DIAGNOSIS — F200 Paranoid schizophrenia: Secondary | ICD-10-CM | POA: Diagnosis not present

## 2024-08-15 LAB — VALPROIC ACID LEVEL: Valproic Acid Lvl: 48 ug/mL — ABNORMAL LOW (ref 50–100)

## 2024-08-15 NOTE — Group Note (Signed)
 Date:  08/15/2024 Time:  10:21 PM  Group Topic/Focus:  Identifying Needs:   The focus of this group is to help patients identify their personal needs that have been historically problematic and identify healthy behaviors to address their needs.    Participation Level:  Active  Participation Quality:  Appropriate  Affect:  Appropriate  Cognitive:  Appropriate  Insight: Appropriate  Engagement in Group:  Engaged  Modes of Intervention:  Discussion  Additional Comments:    Miguel [name] 08/15/2024, 10:21 PM

## 2024-08-15 NOTE — Progress Notes (Signed)
" °   08/14/24 2000  Psych Admission Type (Psych Patients Only)  Admission Status Involuntary  Psychosocial Assessment  Patient Complaints None  Eye Contact Brief  Facial Expression Flat  Affect Preoccupied  Speech Tangential  Interaction Assertive  Motor Activity Slow  Appearance/Hygiene In scrubs  Behavior Characteristics Pacing  Mood Preoccupied  Thought Process  Coherency Disorganized;Tangential  Content Preoccupation  Delusions None reported or observed  Perception Derealization  Hallucination None reported or observed  Judgment Impaired  Confusion Mild  Danger to Self  Current suicidal ideation? Denies  Agreement Not to Harm Self Yes  Description of Agreement verbal  Danger to Others  Danger to Others None reported or observed   Mood/Behavior:  Pleasant and cooperative. Pt up a few times overnight asking when is breakfast? Pt disoriented to time. Pt reoriented and offered snack but, only wanted juice. Pt back out to the nurses station with the same questions a couple more times throughout the night. Pt falling asleep in dayroom and encouraged to return to his room.   Psych assessment: Denies SI/HI and AVH.     Interaction / Group attendance:  Present in the milieu. Interacting with peers and staff.  Attended group.   Medication/ PRNs: Compliant with scheduled medications. Required PRNs Trazodone  for sleep and noted effective.   Pain: Denies    15 min checks in place for safety. "

## 2024-08-15 NOTE — Group Note (Signed)
"                                                 [facility] LCSW Group Therapy Note    Group Date: 08/15/2024 Start Time: 1328 End Time: 1358  Type of Therapy and Topic:  Group Therapy:  Overcoming Obstacles  Participation Level:  [facility] PARTICIPATION LEVEL: Minimal  Mood: Calm, Pleasant  Description of Group:   In this group patients will be encouraged to explore what they see as obstacles to their own wellness and recovery. They will be guided to discuss their thoughts, feelings, and behaviors related to these obstacles. The group will process together ways to cope with barriers, with attention given to specific choices patients can make. Each patient will be challenged to identify changes they are motivated to make in order to overcome their obstacles. This group will be process-oriented, with patients participating in exploration of their own experiences as well as giving and receiving support and challenge from other group members.  Therapeutic Goals: 1. Patient will identify personal and current obstacles as they relate to admission. 2. Patient will identify barriers that currently interfere with their wellness or overcoming obstacles.  3. Patient will identify feelings, thought process and behaviors related to these barriers. 4. Patient will identify two changes they are willing to make to overcome these obstacles:    Summary of Patient Progress  The facilitator and patient discussed things they could control and not control.  Group members create an individual statement on what they could not control.  The facilitator and patient examine how different experiences can influence their  mood.  The patient reflected on how their behaviors shape their current control and things that they can not control.  This distinction allows the patient to honor their limitations and channel their efforts where they will make a difference.    Therapeutic Modalities:   Cognitive Behavioral Therapy Solution  Focused Therapy Motivational Interviewing Relapse Prevention Therapy   Rexene LELON Mae, LCSWA "

## 2024-08-15 NOTE — Progress Notes (Signed)
" °   08/15/24 1940  Psych Admission Type (Psych Patients Only)  Admission Status Involuntary  Psychosocial Assessment  Patient Complaints None  Eye Contact Fair  Facial Expression Animated  Affect Appropriate to circumstance  Speech Tangential  Interaction Assertive  Motor Activity Slow  Appearance/Hygiene In scrubs  Behavior Characteristics Cooperative;Calm  Mood Pleasant  Thought Process  Coherency Tangential  Content Preoccupation  Delusions None reported or observed  Perception WDL  Hallucination None reported or observed  Judgment Poor  Confusion Mild  Danger to Self  Current suicidal ideation? Denies  Agreement Not to Harm Self Yes  Description of Agreement Verbal  Danger to Others  Danger to Others None reported or observed    "

## 2024-08-15 NOTE — Plan of Care (Signed)
" °  Problem: Activity: Goal: Interest or engagement in activities will improve Outcome: Progressing   Problem: Coping: Goal: Ability to verbalize frustrations and anger appropriately will improve Outcome: Progressing   Problem: Education: Goal: Mental status will improve Outcome: Not Progressing   "

## 2024-08-15 NOTE — Progress Notes (Signed)
 Behavior:  Mild confusion.  Pleasant and cooperative.    Psych assessment: Denies SI/HI and AVH.  Interaction / Group attendance:  Present in the milieu.  Appropriate interaction with peers. Attends groups.  Medication/ PRNs: Compliant.  BP medications held due to low BP.  Dr. Ruther made aware.   Pain: 5/10 in legs.  15 min checks in place for safety.

## 2024-08-15 NOTE — Group Note (Signed)
 Date:  08/15/2024 Time:  4:31 PM  Group Topic/Focus:  Managing Feelings:   The focus of this group is to identify what feelings patients have difficulty handling and develop a plan to handle them in a healthier way upon discharge.    Participation Level:  Active  Participation Quality:  Appropriate  Affect:  Appropriate  Cognitive:  Alert  Insight: Appropriate  Engagement in Group:  Engaged  Modes of Intervention:   Discussion Additional Comments:     Miguel [name],Miguel [name] 08/15/2024, 4:31 PM

## 2024-08-15 NOTE — Group Note (Signed)
 Date:  08/15/2024 Time:  10:32 AM  Group Topic/Focus:  Movement Therapy Morning strech with [name]    Participation Level:  Did Not Attend    [name] 08/15/2024, 10:32 AM

## 2024-08-15 NOTE — Plan of Care (Signed)
  Problem: Activity: Goal: Interest or engagement in activities will improve Outcome: Progressing   Problem: Physical Regulation: Goal: Ability to maintain clinical measurements within normal limits will improve Outcome: Progressing   Problem: Safety: Goal: Periods of time without injury will increase Outcome: Progressing

## 2024-08-15 NOTE — Group Note (Unsigned)
"                                                 [facility] LCSW Group Therapy Note    Group Date: 08/15/2024 Start Time: 1328 End Time: 1358  Type of Therapy and Topic:  Group Therapy:  Overcoming Obstacles  Participation Level:  {[facility] PARTICIPATION LEVEL:22264:::1}  Mood:  Description of Group:   In this group patients will be encouraged to explore what they see as obstacles to their own wellness and recovery. They will be guided to discuss their thoughts, feelings, and behaviors related to these obstacles. The group will process together ways to cope with barriers, with attention given to specific choices patients can make. Each patient will be challenged to identify changes they are motivated to make in order to overcome their obstacles. This group will be process-oriented, with patients participating in exploration of their own experiences as well as giving and receiving support and challenge from other group members.  Therapeutic Goals: 1. Patient will identify personal and current obstacles as they relate to admission. 2. Patient will identify barriers that currently interfere with their wellness or overcoming obstacles.  3. Patient will identify feelings, thought process and behaviors related to these barriers. 4. Patient will identify two changes they are willing to make to overcome these obstacles:    Summary of Patient Progress   ***   Therapeutic Modalities:   Cognitive Behavioral Therapy Solution Focused Therapy Motivational Interviewing Relapse Prevention Therapy   Rexene LELON Mae, LCSWA "

## 2024-08-16 DIAGNOSIS — F200 Paranoid schizophrenia: Secondary | ICD-10-CM | POA: Diagnosis not present

## 2024-08-16 DIAGNOSIS — F03918 Unspecified dementia, unspecified severity, with other behavioral disturbance: Secondary | ICD-10-CM | POA: Diagnosis not present

## 2024-08-16 MED ORDER — ARIPIPRAZOLE 5 MG PO TABS
15.0000 mg | ORAL_TABLET | Freq: Every day | ORAL | Status: DC
  Administered 2024-08-17 – 2024-08-23 (×6): 15 mg via ORAL
  Filled 2024-08-16 (×7): qty 3

## 2024-08-16 NOTE — Progress Notes (Signed)
 [facility] MD Progress Note  08/16/2024 8:41 AM Miguel [name]  MRN:  [id number]  Miguel [name] is a 73 year old male with a past psychiatric history significant for schizophrenia who presents under involuntary commitment initiated by family due to medication non-adherence, increasing paranoia, and behavioral disorganization. 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 Brother reports multiple episodes of patient whispering through the night, stating people were talking about him and planning to harm him. Patient was advised by his ACT Team to come to the ED; he refused, prompting the family to pursue IVC.Patient is admitted to [facility] unit with Q15 min safety monitoring. Multidisciplinary team approach is offered. Medication management; group/milieu therapy is offered.  Subjective:  Chart reviewed, case discussed in multidisciplinary meeting, patient seen during rounds.  Continues to be very confused and difficult to express himself because of wordsalad Past Psychiatric History: see h&P Family History:  Family History  Family history unknown: Yes   Social History:  Social History   Substance and Sexual Activity  Alcohol Use No     Social History   Substance and Sexual Activity  Drug Use No    Social History   Socioeconomic History   Marital status: Divorced    Spouse name: Not on file   Number of children: Not on file   Years of education: Not on file   Highest education level: Not on file  Occupational History   Not on file  Tobacco Use   Smoking status: Every  Day    Current packs/day: 0.50    Average packs/day: 0.5 packs/day for 46.0 years (23.0 ttl pk-yrs)    Types: Cigarettes    Start date: 56   Smokeless tobacco: Never  Vaping Use   Vaping status: Never Used  Substance and Sexual Activity   Alcohol use: No   Drug use: No   Sexual activity: Not on file  Other Topics Concern   Not on file  Social History Narrative   Not on file   Social Drivers of Health   Tobacco Use: High Risk (07/18/2024)   Patient History    Smoking Tobacco Use: Every Day    Smokeless Tobacco Use: Never    Passive Exposure: Not on file  Financial Resource Strain: Not on file  Food Insecurity: Patient Unable To Answer (07/18/2024)   Epic    Worried About Programme Researcher, Broadcasting/film/video in the Last Year: Patient unable to answer    Ran Out of Food in the Last Year: Patient unable to answer  Transportation Needs: Unmet Transportation Needs (07/18/2024)   Epic    Lack of Transportation (Medical): Yes    Lack of Transportation (Non-Medical): Yes  Physical Activity: Not on file  Stress: Not on file  Social Connections: Patient Unable To Answer (07/18/2024)   Social Connection and Isolation Panel    Frequency of Communication with Friends and Family: Patient unable to answer    Frequency of Social Gatherings with Friends and Family: Patient unable to answer    Attends Religious Services: Patient unable to answer    Active Member of Clubs or Organizations: Patient unable to answer    Attends Banker Meetings: Patient unable to answer  Marital Status: Patient unable to answer  Depression (PHQ2-9): Not on file  Alcohol Screen: Low Risk (07/18/2024)   Alcohol Screen    Last Alcohol Screening Score (AUDIT): 0  Housing: Unknown (07/18/2024)   Epic    Unable to Pay for Housing in the Last Year: Patient unable to answer    Number of Times Moved in the Last Year: 0    Homeless in the Last Year: Patient declined  Utilities: Patient Unable To Answer  (07/18/2024)   Epic    Threatened with loss of utilities: Patient unable to answer  Health Literacy: Not on file   Past Medical History:  Past Medical History:  Diagnosis Date   Biventricular failure (HCC)    CVA (cerebral vascular accident) (HCC)    Polysubstance use disorder    Schizophrenia (HCC)     Past Surgical History:  Procedure Laterality Date   APPENDECTOMY     TRACHEOSTOMY TUBE PLACEMENT N/A 03/04/2024   Procedure: CREATION, TRACHEOSTOMY;  Surgeon: [name] Massie RAMAN, MD;  Location: [facility] ORS;  Service: ENT;  Laterality: N/A;    Current Medications: Current Facility-Administered Medications  Medication Dose Route Frequency Provider Last Rate Last Admin   acetaminophen  (TYLENOL ) tablet 650 mg  650 mg Oral Q6H PRN Hampton, Tracie B, NP   650 mg at 08/14/24 0239   alum & mag hydroxide-simeth (MAALOX/MYLANTA) 200-200-20 MG/5ML suspension 30 mL  30 mL Oral Q4H PRN Hampton, Tracie B, NP   30 mL at 08/05/24 0306   amiodarone  (PACERONE ) tablet 400 mg  400 mg Oral Daily [name], [name], MD   400 mg at 08/14/24 9041   ARIPiprazole  (ABILIFY ) tablet 10 mg  10 mg Oral Daily [name], [name], MD   10 mg at 08/15/24 0857   ARIPiprazole  ER (ABILIFY  MAINTENA) injection 400 mg  400 mg Intramuscular Q28 days [name], [name], MD   400 mg at 07/27/24 9070   divalproex  (DEPAKOTE  ER) 24 hr tablet 1,000 mg  1,000 mg Oral QHS [name], [name], MD   1,000 mg at 08/15/24 2057   docusate sodium  (COLACE) capsule 100 mg  100 mg Oral Daily [name], [name], MD   100 mg at 08/15/24 0857   feeding supplement (ENSURE PLUS HIGH PROTEIN) liquid 237 mL  237 mL Oral TID BM Cyani Kallstrom R, MD   237 mL at 08/15/24 2057   hydrOXYzine  (ATARAX ) tablet 25 mg  25 mg Oral TID PRN Hampton, Tracie B, NP   25 mg at 08/09/24 2117   magnesium  hydroxide (MILK OF MAGNESIA) suspension 30 mL  30 mL Oral Daily PRN Hampton, Tracie B, NP   30 mL at 08/08/24 2233   memantine  (NAMENDA ) tablet 10 mg  10 mg Oral Daily [name], [name], MD    10 mg at 08/15/24 9141   metoprolol  tartrate (LOPRESSOR ) tablet 25 mg  25 mg Oral BID [name], [name], MD   25 mg at 08/15/24 2059   naproxen  (NAPROSYN ) tablet 250 mg  250 mg Oral BID WC [name], [name], MD   250 mg at 08/15/24 1711   OLANZapine  (ZYPREXA ) injection 5 mg  5 mg Intramuscular TID PRN Hampton, Tracie B, NP       OLANZapine  zydis (ZYPREXA ) disintegrating tablet 5 mg  5 mg Oral TID PRN Hampton, Tracie B, NP   5 mg at 08/04/24 1129   oxyCODONE  (Oxy IR/ROXICODONE ) immediate release tablet 5 mg  5 mg Oral Q4H PRN Dorinda Drue DASEN, MD   5 mg at 08/12/24 0451   pantoprazole  (  PROTONIX ) EC tablet 40 mg  40 mg Oral Daily [name], [name], MD   40 mg at 08/15/24 0858   traZODone  (DESYREL ) tablet 50 mg  50 mg Oral QHS PRN Hampton, Tracie B, NP   50 mg at 08/15/24 2059    Lab Results:  Results for orders placed or performed during the hospital encounter of 07/18/24 (from the past 48 hours)  Valproic acid  level     Status: Abnormal   Collection Time: 08/15/24  8:34 AM  Result Value Ref Range   Valproic Acid  Lvl 48 (L) 50 - 100 ug/mL    Comment: Performed at [facility], [address] [place], [place] [address]      Blood Alcohol level:  Lab Results  Component Value Date   [facility] <15 07/17/2024   ETH <15 02/20/2024    Metabolic Disorder Labs: Lab Results  Component Value Date   HGBA1C 5.4 08/12/2024   MPG 108.28 08/12/2024   MPG 119.76 07/14/2018   No results found for: PROLACTIN Lab Results  Component Value Date   CHOL 139 08/12/2024   TRIG 73 08/12/2024   HDL 52 08/12/2024   CHOLHDL 2.7 08/12/2024   VLDL 15 08/12/2024   LDLCALC 72 08/12/2024   LDLCALC 119 (H) 07/13/2018    Physical Findings: AIMS:  , ,  ,  ,    CIWA:    COWS:      Psychiatric Specialty Exam:  Presentation  General Appearance: Appropriate for Environment  Eye Contact:Fleeting  Speech:Normal Rate  Speech Volume:Decreased    Mood and Affect   Mood:Anxious  Affect:Flat   Thought Process  Thought Processes:circumstantial Orientation:Partial  Thought Content:discharge focused  Hallucinations:denies  Ideas of Reference:Delusions At baseline Suicidal Thoughts:denies  Homicidal Thoughts:denies   Sensorium  Memory:Immediate Poor; Recent Poor; Remote Poor  Judgment:Impaired  Insight:None   Executive Functions  Concentration:Poor  Attention Span:Poor  Recall:Poor  Fund of Knowledge:Poor  Language:Fair   Psychomotor Activity  Psychomotor Activity:No data recorded  Musculoskeletal: Strength & Muscle Tone: within normal limits Gait & Station: normal Assets  Assets:Communication Skills; Desire for Improvement; Social Support    Physical Exam: Physical Exam Vitals and nursing note reviewed.    ROS Blood pressure (!) 115/51, pulse (!) 58, temperature 98.2 F (36.8 C), resp. rate 18, height 6' 1 (1.854 m), weight 81.2 kg, SpO2 96%. Body mass index is 23.62 kg/m.  Diagnosis: Active Problems:   Schizophrenia, paranoid (HCC)   Dementia with behavioral disturbance (HCC)   PLAN: Safety and Monitoring:  -- Involuntary admission to inpatient psychiatric unit for safety, stabilization and treatment  -- Daily contact with patient to assess and evaluate symptoms and progress in treatment  -- Patient's case to be discussed in multi-disciplinary team meeting  -- Observation Level : q15 minute checks  -- Vital signs:  q12 hours  -- Precautions: suicide, elopement, and assault -- Encouraged patient to participate in unit milieu and in scheduled group therapies  2. Psychiatric Treatment:  Scheduled Medications:   Abilify  10 mg due to worsening agitation and paranoia even after LAI -Abilify  10 mg daily LAY Abilify  Maintena 400 mg q4 weeks given on 07/27/24  Cogentin  1mg  one dose given for EPS Depakote  ER increased to 1000 mg to help with mood stabilization as he was receiving multiple as needed  antipsychotics for agitation  -- The risks/benefits/side-effects/alternatives to this medication were discussed in detail with the patient and time was given for questions. The patient consents to medication trial.  3. Medical  Issues Being Addressed:  TRH consulted for wound care management   Assessment and Plan:   Sacral Decubitus Ulcer Full thickness decubitus ulcer in setting of recent extended hospitalization, discharged from [facility] Nov 14th Wound looks pretty good overall WOC on board we appreciate input   Cognitive Deficit Concern for anoxic brain injury at discharge 02/2024 Delirium precautions   Peripheral Vascular Disease Legs with features c/w peripheral vascular disease, diminished DP pulses Would benefit from outpatient ABI's   HFrEF Euvolemic at this time Continue home medications   Hx Atrial Flutter Not on any anticoagulation at this time EKG with NSR Continue amiodarone   Hypertension Noted, adjust meds gradually after med rec complete     4. Discharge Planning:   -- Social work and case management to assist with discharge planning and identification of hospital follow-up needs prior to discharge  -- Estimated LOS: 3-4 days  Millie JONELLE Manners, MD 08/16/2024, 8:41 AM

## 2024-08-16 NOTE — BH IP Treatment Plan (Signed)
 Interdisciplinary Treatment and Diagnostic Plan Update  08/16/2024 Time of Session: 12:22 pm Miguel [name] MRN: [id number]  Principal Diagnosis: <principal problem not specified>  Secondary Diagnoses: Active Problems:   Schizophrenia, paranoid (HCC)   Dementia with behavioral disturbance (HCC)   Current Medications:  Current Facility-Administered Medications  Medication Dose Route Frequency Provider Last Rate Last Admin   acetaminophen  (TYLENOL ) tablet 650 mg  650 mg Oral Q6H PRN Hampton, Tracie B, NP   650 mg at 08/14/24 0239   alum & mag hydroxide-simeth (MAALOX/MYLANTA) 200-200-20 MG/5ML suspension 30 mL  30 mL Oral Q4H PRN Hampton, Tracie B, NP   30 mL at 08/05/24 0306   amiodarone  (PACERONE ) tablet 400 mg  400 mg Oral Daily [name], [name], MD   400 mg at 08/14/24 0958   [START ON 08/17/2024] ARIPiprazole  (ABILIFY ) tablet 15 mg  15 mg Oral Daily [name], [name], MD       ARIPiprazole  ER (ABILIFY  MAINTENA) injection 400 mg  400 mg Intramuscular Q28 days [name], [name], MD   400 mg at 07/27/24 9070   divalproex  (DEPAKOTE  ER) 24 hr tablet 1,000 mg  1,000 mg Oral QHS [name], [name], MD   1,000 mg at 08/15/24 2057   docusate sodium  (COLACE) capsule 100 mg  100 mg Oral Daily [name], [name], MD   100 mg at 08/16/24 0905   feeding supplement (ENSURE PLUS HIGH PROTEIN) liquid 237 mL  237 mL Oral TID BM [name], [name], MD   237 mL at 08/16/24 1045   hydrOXYzine  (ATARAX ) tablet 25 mg  25 mg Oral TID PRN Hampton, Tracie B, NP   25 mg at 08/09/24 2117   magnesium  hydroxide (MILK OF MAGNESIA) suspension 30 mL  30 mL Oral Daily PRN Hampton, Tracie B, NP   30 mL at 08/08/24 2233   memantine  (NAMENDA ) tablet 10 mg  10 mg Oral Daily [name], [name], MD   10 mg at 08/16/24 0905   metoprolol  tartrate (LOPRESSOR ) tablet 25 mg  25 mg Oral BID [name], [name], MD   25 mg at 08/15/24 2059   naproxen  (NAPROSYN ) tablet 250 mg  250 mg Oral BID WC [name], [name], MD   250 mg at 08/16/24 9094    OLANZapine  (ZYPREXA ) injection 5 mg  5 mg Intramuscular TID PRN Hampton, Tracie B, NP       OLANZapine  zydis (ZYPREXA ) disintegrating tablet 5 mg  5 mg Oral TID PRN Hampton, Tracie B, NP   5 mg at 08/04/24 1129   oxyCODONE  (Oxy IR/ROXICODONE ) immediate release tablet 5 mg  5 mg Oral Q4H PRN [name], [name], MD   5 mg at 08/12/24 0451   pantoprazole  (PROTONIX ) EC tablet 40 mg  40 mg Oral Daily [name], [name], MD   40 mg at 08/16/24 9094   traZODone  (DESYREL ) tablet 50 mg  50 mg Oral QHS PRN Hampton, Tracie B, NP   50 mg at 08/15/24 2059   PTA Medications: Medications Prior to Admission  Medication Sig Dispense Refill Last Dose/Taking   amiodarone  (PACERONE ) 400 MG tablet Place 1 tablet (400 mg total) into feeding tube daily.      ARIPiprazole  (ABILIFY ) 30 MG tablet Place 1 tablet (30 mg total) into feeding tube at bedtime.      bisacodyl  (DULCOLAX) 10 MG suppository Place 1 suppository (10 mg total) rectally daily as needed for moderate constipation or severe constipation.      busPIRone  (BUSPAR ) 5 MG tablet Take 5 mg by mouth 3 (three) times  daily.      Chlorhexidine  Gluconate Cloth 2 % PADS Apply 6 each topically daily.      digoxin  (LANOXIN ) 0.125 MG tablet 1 tablet (0.125 mg total) by Per NG tube route daily. (Patient taking differently: Take 0.125 mg by mouth every other day.)      docusate (COLACE) 50 MG/5ML liquid Place 10 mLs (100 mg total) into feeding tube 2 (two) times daily.      metoprolol  tartrate (LOPRESSOR ) 25 MG tablet Take 25 mg by mouth 2 (two) times daily.      pantoprazole  (PROTONIX ) 40 MG tablet Take 40 mg by mouth daily.      traMADol  (ULTRAM ) 50 MG tablet Take 50 mg by mouth 2 (two) times daily.       Patient Stressors: Educational concerns   Health problems   Medication change or noncompliance   Other: Stated transitional housing concerns    Patient Strengths: Manufacturing systems engineer  Religious Affiliation   Treatment Modalities: Medication Management, Group therapy,  Case management,  1 to 1 session with clinician, Psychoeducation, Recreational therapy.   Physician Treatment Plan for Primary Diagnosis: <principal problem not specified> Long Term Goal(s):     Short Term Goals:    Medication Management: Evaluate patient's response, side effects, and tolerance of medication regimen.  Therapeutic Interventions: 1 to 1 sessions, Unit Group sessions and Medication administration.  Evaluation of Outcomes: Progressing  Physician Treatment Plan for Secondary Diagnosis: Active Problems:   Schizophrenia, paranoid (HCC)   Dementia with behavioral disturbance (HCC)  Long Term Goal(s):     Short Term Goals:       Medication Management: Evaluate patient's response, side effects, and tolerance of medication regimen.  Therapeutic Interventions: 1 to 1 sessions, Unit Group sessions and Medication administration.  Evaluation of Outcomes: Progressing   RN Treatment Plan for Primary Diagnosis: <principal problem not specified> Long Term Goal(s): Knowledge of disease and therapeutic regimen to maintain health will improve  Short Term Goals: Ability to remain free from injury will improve, Ability to verbalize frustration and anger appropriately will improve, Ability to demonstrate self-control, Ability to participate in decision making will improve, Ability to verbalize feelings will improve, Ability to disclose and discuss suicidal ideas, Ability to identify and develop effective coping behaviors will improve, and Compliance with prescribed medications will improve  Medication Management: RN will administer medications as ordered by provider, will assess and evaluate patient's response and provide education to patient for prescribed medication. RN will report any adverse and/or side effects to prescribing provider.  Therapeutic Interventions: 1 on 1 counseling sessions, Psychoeducation, Medication administration, Evaluate responses to treatment, Monitor vital  signs and CBGs as ordered, Perform/monitor CIWA, COWS, AIMS and Fall Risk screenings as ordered, Perform wound care treatments as ordered.  Evaluation of Outcomes: Progressing   LCSW Treatment Plan for Primary Diagnosis: <principal problem not specified> Long Term Goal(s): Safe transition to appropriate next level of care at discharge, Engage patient in therapeutic group addressing interpersonal concerns.  Short Term Goals: Engage patient in aftercare planning with referrals and resources, Increase social support, Increase ability to appropriately verbalize feelings, Increase emotional regulation, Facilitate acceptance of mental health diagnosis and concerns, and Increase skills for wellness and recovery  Therapeutic Interventions: Assess for all discharge needs, 1 to 1 time with Social worker, Explore available resources and support systems, Assess for adequacy in community support network, Educate family and significant other(s) on suicide prevention, Complete Psychosocial Assessment, Interpersonal group therapy.  Evaluation of Outcomes: Progressing   Progress  in Treatment: Attending groups: Yes. Participating in groups: Yes. Taking medication as prescribed: Yes. Toleration medication: Yes. Family/Significant other contact made: No, will contact:  CSW to contact once permission is granted. 08/11/24 Update: CSW contacted [employer].  Patient understands diagnosis: No. Discussing patient identified problems/goals with staff: Yes. Medical problems stabilized or resolved: Yes. Denies suicidal/homicidal ideation: Yes. Issues/concerns per patient self-inventory: Yes. Other: None   New problem(s) identified: New problem(s) identified: No, Describe:  None Update 07/26/24: No changes at this time Update 08/01/24: No changes at this time  Update 08/06/24: No changes at this time 08/11/24 Update: No changes at this time.  Update 08/16/24: No changes at this time.    New Short Term/Long Term  Goal(s):detox, elimination of symptoms of psychosis, medication management for mood stabilization; elimination of SI thoughts; development of comprehensive mental wellness/sobriety plan.  Update 07/26/24: No changes at this time Update 08/01/24: No changes at this time Update 08/06/24: No changes at this time  10/13/23 Update: No changes at this time. Update 08/16/24: No changes at this time.      Patient Goals:  Make sure everything is settled. Update 07/26/24: No changes at this time Update 08/01/24: No changes at this time Update 08/06/24: No changes at this time Update 08/06/24: No changes at this time 08/11/24 Update: No changes at this time. Update 08/16/24: No changes at this time.    Discharge Plan or Barriers: CSW to assist with the development of appropriate discharge plan.  Update 07/26/24: No changes at this time Update 08/01/24: APS Report made for pt, screened it. Update 08/06/24: FL2 sent to [facility]. Pt is not placement. [facility] and APS to assist with placement. 08/11/24 Update: CSW to continue to support [facility] in working toward placement for patient. Update 08/16/24: No changes at this time.    Reason for Continuation of Hospitalization: Aggression Anxiety Delusions  Depression Hallucinations Suicidal ideation   Estimated Length of Stay: 1-7 days. Update 07/26/24: TBD Update 08/01/24: TBD Update 08/06/24: TBD 08/11/24 Update: TBD.  Update 08/16/24: TBD  Last 3 [place] Suicide Severity Risk Score: Flowsheet Row Admission (Current) from 07/18/2024 in [facility] ED from 07/17/2024 in [facility] ED to Hosp-Admission (Discharged) from 02/20/2024 in [facility] ICU/CCU  C-SSRS RISK CATEGORY No Risk No Risk No Risk    Last PHQ 2/9 Scores:     No data to display          Scribe for Treatment Team: Roselyn GORMAN Lento, KEN 08/16/2024 12:21 PM

## 2024-08-16 NOTE — Group Note (Signed)
 Date:  08/16/2024 Time:  11:15 PM  Group Topic/Focus:  Wrap-Up Group:   The focus of this group is to help patients review their daily goal of treatment and discuss progress on daily workbooks.    Participation Level:  Active  Participation Quality:  Appropriate  Affect:  Appropriate  Cognitive:  Appropriate  Insight: Appropriate  Engagement in Group:  Engaged  Modes of Intervention:  Discussion  Additional Comments:    Miguel [name] Bunker 08/16/2024, 11:15 PM

## 2024-08-16 NOTE — Plan of Care (Signed)
  Problem: Activity: Goal: Interest or engagement in activities will improve Outcome: Progressing   Problem: Education: Goal: Mental status will improve Outcome: Not Progressing

## 2024-08-16 NOTE — Progress Notes (Signed)
 [facility] MD Progress Note  08/16/2024 10:45 AM Miguel [name]  MRN:  [id number]  Miguel [name] is a 73 year old male with a past psychiatric history significant for schizophrenia who presents under involuntary commitment initiated by family due to medication non-adherence, increasing paranoia, and behavioral disorganization. 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 Brother reports multiple episodes of patient whispering through the night, stating people were talking about him and planning to harm him. Patient was advised by his ACT Team to come to the ED; he refused, prompting the family to pursue IVC.Patient is admitted to [facility] unit with Q15 min safety monitoring. Multidisciplinary team approach is offered. Medication management; group/milieu therapy is offered.  Subjective:  Chart reviewed, case discussed in multidisciplinary meeting, patient seen during rounds.  Patient continues to be very confused and irritable easily gets upset Past Psychiatric History: see h&P Family History:  Family History  Family history unknown: Yes   Social History:  Social History   Substance and Sexual Activity  Alcohol Use No     Social History   Substance and Sexual Activity  Drug Use No    Social History   Socioeconomic History   Marital status: Divorced    Spouse name: Not on file   Number of children: Not on file   Years of education: Not on file   Highest education level: Not on file  Occupational History   Not on file  Tobacco Use   Smoking status: Every Day     Current packs/day: 0.50    Average packs/day: 0.5 packs/day for 46.0 years (23.0 ttl pk-yrs)    Types: Cigarettes    Start date: 49   Smokeless tobacco: Never  Vaping Use   Vaping status: Never Used  Substance and Sexual Activity   Alcohol use: No   Drug use: No   Sexual activity: Not on file  Other Topics Concern   Not on file  Social History Narrative   Not on file   Social Drivers of Health   Tobacco Use: High Risk (07/18/2024)   Patient History    Smoking Tobacco Use: Every Day    Smokeless Tobacco Use: Never    Passive Exposure: Not on file  Financial Resource Strain: Not on file  Food Insecurity: Patient Unable To Answer (07/18/2024)   Epic    Worried About Programme Researcher, Broadcasting/film/video in the Last Year: Patient unable to answer    Ran Out of Food in the Last Year: Patient unable to answer  Transportation Needs: Unmet Transportation Needs (07/18/2024)   Epic    Lack of Transportation (Medical): Yes    Lack of Transportation (Non-Medical): Yes  Physical Activity: Not on file  Stress: Not on file  Social Connections: Patient Unable To Answer (07/18/2024)   Social Connection and Isolation Panel    Frequency of Communication with Friends and Family: Patient unable to answer    Frequency of Social Gatherings with Friends and Family: Patient unable to answer    Attends Religious Services: Patient unable to answer    Active Member of Clubs or Organizations: Patient unable to answer    Attends Banker Meetings: Patient unable to answer  Marital Status: Patient unable to answer  Depression (PHQ2-9): Not on file  Alcohol Screen: Low Risk (07/18/2024)   Alcohol Screen    Last Alcohol Screening Score (AUDIT): 0  Housing: Unknown (07/18/2024)   Epic    Unable to Pay for Housing in the Last Year: Patient unable to answer    Number of Times Moved in the Last Year: 0    Homeless in the Last Year: Patient declined  Utilities: Patient Unable To Answer (07/18/2024)    Epic    Threatened with loss of utilities: Patient unable to answer  Health Literacy: Not on file   Past Medical History:  Past Medical History:  Diagnosis Date   Biventricular failure (HCC)    CVA (cerebral vascular accident) (HCC)    Polysubstance use disorder    Schizophrenia (HCC)     Past Surgical History:  Procedure Laterality Date   APPENDECTOMY     TRACHEOSTOMY TUBE PLACEMENT N/A 03/04/2024   Procedure: CREATION, TRACHEOSTOMY;  Surgeon: [name] Massie RAMAN, MD;  Location: [facility] ORS;  Service: ENT;  Laterality: N/A;    Current Medications: Current Facility-Administered Medications  Medication Dose Route Frequency Provider Last Rate Last Admin   acetaminophen  (TYLENOL ) tablet 650 mg  650 mg Oral Q6H PRN Hampton, Tracie B, NP   650 mg at 08/14/24 0239   alum & mag hydroxide-simeth (MAALOX/MYLANTA) 200-200-20 MG/5ML suspension 30 mL  30 mL Oral Q4H PRN Hampton, Tracie B, NP   30 mL at 08/05/24 0306   amiodarone  (PACERONE ) tablet 400 mg  400 mg Oral Daily [name], [name], MD   400 mg at 08/14/24 0958   [START ON 08/17/2024] ARIPiprazole  (ABILIFY ) tablet 15 mg  15 mg Oral Daily Darrin Apodaca R, MD       ARIPiprazole  ER (ABILIFY  MAINTENA) injection 400 mg  400 mg Intramuscular Q28 days [name], [name], MD   400 mg at 07/27/24 9070   divalproex  (DEPAKOTE  ER) 24 hr tablet 1,000 mg  1,000 mg Oral QHS [name], [name], MD   1,000 mg at 08/15/24 2057   docusate sodium  (COLACE) capsule 100 mg  100 mg Oral Daily [name], [name], MD   100 mg at 08/16/24 0905   feeding supplement (ENSURE PLUS HIGH PROTEIN) liquid 237 mL  237 mL Oral TID BM Nori Poland R, MD   237 mL at 08/15/24 2057   hydrOXYzine  (ATARAX ) tablet 25 mg  25 mg Oral TID PRN Hampton, Tracie B, NP   25 mg at 08/09/24 2117   magnesium  hydroxide (MILK OF MAGNESIA) suspension 30 mL  30 mL Oral Daily PRN Hampton, Tracie B, NP   30 mL at 08/08/24 2233   memantine  (NAMENDA ) tablet 10 mg  10 mg Oral Daily [name], [name], MD   10 mg at  08/16/24 9094   metoprolol  tartrate (LOPRESSOR ) tablet 25 mg  25 mg Oral BID [name], [name], MD   25 mg at 08/15/24 2059   naproxen  (NAPROSYN ) tablet 250 mg  250 mg Oral BID WC [name], [name], MD   250 mg at 08/16/24 9094   OLANZapine  (ZYPREXA ) injection 5 mg  5 mg Intramuscular TID PRN Hampton, Tracie B, NP       OLANZapine  zydis (ZYPREXA ) disintegrating tablet 5 mg  5 mg Oral TID PRN Hampton, Tracie B, NP   5 mg at 08/04/24 1129   oxyCODONE  (Oxy IR/ROXICODONE ) immediate release tablet 5 mg  5 mg Oral Q4H PRN Dorinda Drue DASEN, MD   5 mg at 08/12/24 0451  pantoprazole  (PROTONIX ) EC tablet 40 mg  40 mg Oral Daily [name], [name], MD   40 mg at 08/16/24 9094   traZODone  (DESYREL ) tablet 50 mg  50 mg Oral QHS PRN Hampton, Tracie B, NP   50 mg at 08/15/24 2059    Lab Results:  Results for orders placed or performed during the hospital encounter of 07/18/24 (from the past 48 hours)  Valproic acid  level     Status: Abnormal   Collection Time: 08/15/24  8:34 AM  Result Value Ref Range   Valproic Acid  Lvl 48 (L) 50 - 100 ug/mL    Comment: Performed at [facility], [address] [place], [place] [address]      Blood Alcohol level:  Lab Results  Component Value Date   [facility] <15 07/17/2024   ETH <15 02/20/2024    Metabolic Disorder Labs: Lab Results  Component Value Date   HGBA1C 5.4 08/12/2024   MPG 108.28 08/12/2024   MPG 119.76 07/14/2018   No results found for: PROLACTIN Lab Results  Component Value Date   CHOL 139 08/12/2024   TRIG 73 08/12/2024   HDL 52 08/12/2024   CHOLHDL 2.7 08/12/2024   VLDL 15 08/12/2024   LDLCALC 72 08/12/2024   LDLCALC 119 (H) 07/13/2018    Physical Findings: AIMS:  , ,  ,  ,    CIWA:    COWS:      Psychiatric Specialty Exam:  Presentation  General Appearance: Appropriate for Environment  Eye Contact:Fleeting  Speech:Normal Rate  Speech Volume:Decreased    Mood and Affect  Mood:Anxious  Affect:Flat   Thought  Process  Thought Processes:circumstantial Orientation:Partial  Thought Content:discharge focused  Hallucinations:denies  Ideas of Reference:Delusions At baseline Suicidal Thoughts:denies  Homicidal Thoughts:denies   Sensorium  Memory:Immediate Poor; Recent Poor; Remote Poor  Judgment:Impaired  Insight:None   Executive Functions  Concentration:Poor  Attention Span:Poor  Recall:Poor  Fund of Knowledge:Poor  Language:Fair   Psychomotor Activity  Psychomotor Activity:No data recorded  Musculoskeletal: Strength & Muscle Tone: within normal limits Gait & Station: normal Assets  Assets:Communication Skills; Desire for Improvement; Social Support    Physical Exam: Physical Exam Vitals and nursing note reviewed.    ROS Blood pressure (!) 115/51, pulse (!) 58, temperature 98.2 F (36.8 C), resp. rate 18, height 6' 1 (1.854 m), weight 81.2 kg, SpO2 96%. Body mass index is 23.62 kg/m.  Diagnosis: Active Problems:   Schizophrenia, paranoid (HCC)   Dementia with behavioral disturbance (HCC)   PLAN: Safety and Monitoring:  -- Involuntary admission to inpatient psychiatric unit for safety, stabilization and treatment  -- Daily contact with patient to assess and evaluate symptoms and progress in treatment  -- Patient's case to be discussed in multi-disciplinary team meeting  -- Observation Level : q15 minute checks  -- Vital signs:  q12 hours  -- Precautions: suicide, elopement, and assault -- Encouraged patient to participate in unit milieu and in scheduled group therapies  2. Psychiatric Treatment:  Scheduled Medications:  .  Increase Abilify  to 15 mg daily LAY Abilify  Maintena 400 mg q4 weeks given on 07/27/24  Cogentin  1mg  one dose given for EPS Depakote  ER increased to 1000 mg to help with mood stabilization as he was receiving multiple as needed antipsychotics for agitation  -- The risks/benefits/side-effects/alternatives to this medication were  discussed in detail with the patient and time was given for questions. The patient consents to medication trial.  3. Medical Issues Being Addressed:  [facility] consulted for wound  care management   Assessment and Plan:   Sacral Decubitus Ulcer Full thickness decubitus ulcer in setting of recent extended hospitalization, discharged from [facility] Nov 14th Wound looks pretty good overall WOC on board we appreciate input   Cognitive Deficit Concern for anoxic brain injury at discharge 02/2024 Delirium precautions   Peripheral Vascular Disease Legs with features c/w peripheral vascular disease, diminished DP pulses Would benefit from outpatient ABI's   HFrEF Euvolemic at this time Continue home medications   Hx Atrial Flutter Not on any anticoagulation at this time EKG with NSR Continue amiodarone   Hypertension Noted, adjust meds gradually after med rec complete     4. Discharge Planning:   -- Social work and case management to assist with discharge planning and identification of hospital follow-up needs prior to discharge  -- Estimated LOS: 3-4 days  Millie JONELLE Manners, MD 08/16/2024, 10:45 AM

## 2024-08-16 NOTE — Group Note (Signed)
 Date:  08/16/2024 Time:  10:36 AM  Group Topic/Focus:  Movement Therapy, Morning Stretch with Nikkita Adeyemi.    Participation Level:  Did Not Attend    Miguel [name] 08/16/2024, 10:36 AM

## 2024-08-16 NOTE — Group Note (Signed)
 Date:  08/16/2024 Time:  4:26 PM  Group Topic/Focus:  Wellness Toolbox:   The focus of this group is to discuss various aspects of wellness, balancing those aspects and exploring ways to increase the ability to experience wellness.  Patients will create a wellness toolbox for use upon discharge.    Participation Level:  Did Not Attend   Larrie Leita BRAVO 08/16/2024, 4:26 PM

## 2024-08-16 NOTE — Progress Notes (Signed)
 Behavior:   Pleasant and cooperative.  Observed to be sleeping more during the day.   Psych assessment:  Denies SI/HI and AVH.    Interaction / Group attendance:  Less time in milieu today. Appropriate interaction with peers and staff.  Attends most groups.  Medication/ PRNs: Compliant.  Cardiac medications held due to low BP.    Pain: Denies.  15 min checks in place for safety.

## 2024-08-17 DIAGNOSIS — F200 Paranoid schizophrenia: Secondary | ICD-10-CM | POA: Diagnosis not present

## 2024-08-17 DIAGNOSIS — F03918 Unspecified dementia, unspecified severity, with other behavioral disturbance: Secondary | ICD-10-CM | POA: Diagnosis not present

## 2024-08-17 NOTE — Group Note (Signed)
 Physical/Occupational Therapy Group Note  Group Topic: Transfer Training   Group Date: 08/17/2024 Start Time: 1315 End Time: 1340 Facilitators: Haadiya Frogge, Alm Hamilton, PT   Group Description: Group educated on sequence and techniques to maximize safety with functional transfers.  Additionally, integrated education on impact of seating surfaces, use of assistive device and management of orthostasis with movement transitions.  Patients actively engaged with functional transfers (sit/stand) from various seating surfaces, with and without assist devices, working to integrate and retain education provided during session.  Allowed time for questions and further discussion on mobility concerns/needs.   Therapeutic Goal(s):  Identify and demonstrate safe technique for sit/stand transfers from various seating surfaces. Identify and demonstrate safe use of assistive devices with basic transfers and simple mobility. Identify and demonstrate ability to recognize signs/symptoms of orthostasis and appropriate compensatory/safety techniques.  Individual Participation: Did not attend  Participation Level:   Participation Quality:   Behavior:   Speech/Thought Process:   Affect/Mood:   Insight:   Judgement:   Individualization:   Modes of Intervention:   Patient Response to Interventions:    Plan: Continue to engage patient in PT/OT groups 1 - 2x/week.  CHARM Hamilton Bertin PT, DPT 08/17/2024, 1:57 PM

## 2024-08-17 NOTE — Plan of Care (Signed)
" °  Problem: Activity: Goal: Interest or engagement in activities will improve Outcome: Progressing   Problem: Coping: Goal: Ability to verbalize frustrations and anger appropriately will improve Outcome: Progressing   Problem: Education: Goal: Knowledge of [facility] information/materials will improve Outcome: Progressing   Problem: Activity: Goal: Interest or engagement in activities will improve Outcome: Progressing   "

## 2024-08-17 NOTE — Progress Notes (Signed)
" °   08/16/24 2000  Psych Admission Type (Psych Patients Only)  Admission Status Involuntary  Psychosocial Assessment  Patient Complaints None  Eye Contact Fair  Facial Expression Animated  Affect Appropriate to circumstance  Speech Tangential  Interaction Assertive  Motor Activity Slow  Appearance/Hygiene In scrubs  Behavior Characteristics Cooperative;Appropriate to situation  Mood Pleasant  Thought Process  Coherency Tangential  Content Preoccupation  Delusions None reported or observed  Perception WDL  Hallucination None reported or observed  Judgment Impaired  Confusion Mild  Danger to Self  Current suicidal ideation? Denies  Agreement Not to Harm Self Yes  Description of Agreement verbal  Danger to Others  Danger to Others None reported or observed   Mood/Behavior:  Pleasant and cooperative. Pt disoriented to situation and up a few times at the nurses station overnight.    Psych assessment: Denies SI/HI and AVH.     Interaction / Group attendance:  Present in the milieu. Interacting with peers and staff.  Attended group.   Medication/ PRNs: Compliant with scheduled medications. Required PRNs Trazodone  for sleep and noted effective.   Pain: Denies    15 min checks in place for safety. "

## 2024-08-17 NOTE — Plan of Care (Signed)
  Problem: Education: Goal: Knowledge of [place] [facility] Education information/materials will improve Outcome: Progressing   Problem: Activity: Goal: Interest or engagement in activities will improve Outcome: Progressing   Problem: Coping: Goal: Ability to verbalize frustrations and anger appropriately will improve Outcome: Progressing   Problem: Education: Goal: Knowledge of  General Education information/materials will improve Outcome: Progressing Goal: Emotional status will improve Outcome: Progressing Goal: Mental status will improve Outcome: Progressing Goal: Verbalization of understanding the information provided will improve Outcome: Progressing   Problem: Activity: Goal: Interest or engagement in activities will improve Outcome: Progressing Goal: Sleeping patterns will improve Outcome: Progressing   Problem: Coping: Goal: Ability to verbalize frustrations and anger appropriately will improve Outcome: Progressing Goal: Ability to demonstrate self-control will improve Outcome: Progressing   Problem: Health Behavior/Discharge Planning: Goal: Identification of resources available to assist in meeting health care needs will improve Outcome: Progressing Goal: Compliance with treatment plan for underlying cause of condition will improve Outcome: Progressing   Problem: Physical Regulation: Goal: Ability to maintain clinical measurements within normal limits will improve Outcome: Progressing   Problem: Safety: Goal: Periods of time without injury will increase Outcome: Progressing   Problem: Coping: Goal: Ability to adjust to condition or change in health will improve Outcome: Progressing   Problem: Health Behavior/Discharge Planning: Goal: Ability to identify and utilize available resources and services will improve Outcome: Progressing

## 2024-08-17 NOTE — Group Note (Signed)
 Recreation Therapy Group Note   Group Topic:Healthy Support Systems  Group Date: 08/17/2024 Start Time: 1400 End Time: 1435 Facilitators: Celestia Jeoffrey BRAVO, LRT, CTRS Location: Dayroom  Group Description: Straw Bridge. In groups or individually, patients were given 10 plastic drinking straws and an equal length of masking tape. Using the materials provided, patients were instructed to build a free-standing bridge-like structure to suspend an everyday item (ex: deck of cards) off the floor or table surface. All materials were required to be used in secondary school teacher. LRT facilitated post-activity discussion reviewing the importance of having strong and healthy support systems in our lives. LRT discussed how the people in our lives serve as the tape and the deck of cards we placed on top of our straw structure are the stressors we face in daily life. LRT and pts discussed what happens in our life when things get too heavy for us , and we don't have strong supports outside of the hospital. Pt shared 2 of their healthy supports in their life aloud in the group.   Goal Area(s) Addressed:  Patient will identify 2 healthy supports in their life. Patient will identify skills to successfully complete activity. Patient will identify correlation of this activity to life post-discharge.  Patient will build on frustration tolerance skills. Patient will increase team building and communication skills.     Affect/Mood: N/A   Participation Level: Did not attend    Clinical Observations/Individualized Feedback: Josejulian was asleep duration of session.   Plan: Continue to engage patient in RT group sessions 2-3x/week.   Jeoffrey BRAVO Celestia, LRT, CTRS 08/17/2024 4:44 PM

## 2024-08-17 NOTE — Progress Notes (Signed)
 [facility] MD Progress Note  08/17/2024 12:40 PM DWAINE PRINGLE  MRN:  [id number]  Alger Kerstein. Moehring is a 73 year old male with a past psychiatric history significant for schizophrenia who presents under involuntary commitment initiated by family due to medication non-adherence, increasing paranoia, and behavioral disorganization. 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 Brother reports multiple episodes of patient whispering through the night, stating people were talking about him and planning to harm him. Patient was advised by his ACT Team to come to the ED; he refused, prompting the family to pursue IVC.Patient is admitted to [facility] unit with Q15 min safety monitoring. Multidisciplinary team approach is offered. Medication management; group/milieu therapy is offered.  Subjective:  Chart reviewed, case discussed in multidisciplinary meeting, patient seen during rounds.   Patient is noted to be sitting in the day area.  He came to the provider and complained about people trying to mess up with his things in his room.  He is unable to give any examples of that.  Patient remains confused and is unable to comprehend the reasons for his hospitalization.  He is unable to remember if he had any phone calls or visitations from his family.  He denies SI/HI/plan.  He remains paranoid but is not responding to internal stimuli.  He is taking his medications with no reported side effects.  Past Psychiatric History: see h&P Family History:  Family History  Family history unknown: Yes   Social  History:  Social History   Substance and Sexual Activity  Alcohol Use No     Social History   Substance and Sexual Activity  Drug Use No    Social History   Socioeconomic History   Marital status: Divorced    Spouse name: Not on file   Number of children: Not on file   Years of education: Not on file   Highest education level: Not on file  Occupational History   Not on file  Tobacco Use   Smoking status: Every Day    Current packs/day: 0.50    Average packs/day: 0.5 packs/day for 46.0 years (23.0 ttl pk-yrs)    Types: Cigarettes    Start date: 60   Smokeless tobacco: Never  Vaping Use   Vaping status: Never Used  Substance and Sexual Activity   Alcohol use: No   Drug use: No   Sexual activity: Not on file  Other Topics Concern   Not on file  Social History Narrative   Not on file   Social Drivers of Health   Tobacco Use: High Risk (07/18/2024)   Patient History    Smoking Tobacco Use: Every Day    Smokeless Tobacco Use: Never    Passive Exposure: Not on file  Financial Resource Strain: Not on file  Food Insecurity: Patient Unable To Answer (07/18/2024)   Epic    Worried About Programme Researcher, Broadcasting/film/video in the Last Year: Patient unable to answer    Ran Out of Food in the Last Year: Patient unable to answer  Transportation Needs: Unmet Transportation Needs (07/18/2024)   Epic    Lack of Transportation (Medical): Yes    Lack of Transportation (Non-Medical): Yes  Physical Activity: Not on  file  Stress: Not on file  Social Connections: Patient Unable To Answer (07/18/2024)   Social Connection and Isolation Panel    Frequency of Communication with Friends and Family: Patient unable to answer    Frequency of Social Gatherings with Friends and Family: Patient unable to answer    Attends Religious Services: Patient unable to answer    Active Member of Clubs or Organizations: Patient unable to answer    Attends Banker Meetings: Patient unable to answer     Marital Status: Patient unable to answer  Depression (PHQ2-9): Not on file  Alcohol Screen: Low Risk (07/18/2024)   Alcohol Screen    Last Alcohol Screening Score (AUDIT): 0  Housing: Unknown (07/18/2024)   Epic    Unable to Pay for Housing in the Last Year: Patient unable to answer    Number of Times Moved in the Last Year: 0    Homeless in the Last Year: Patient declined  Utilities: Patient Unable To Answer (07/18/2024)   Epic    Threatened with loss of utilities: Patient unable to answer  Health Literacy: Not on file   Past Medical History:  Past Medical History:  Diagnosis Date   Biventricular failure (HCC)    CVA (cerebral vascular accident) (HCC)    Polysubstance use disorder    Schizophrenia (HCC)     Past Surgical History:  Procedure Laterality Date   APPENDECTOMY     TRACHEOSTOMY TUBE PLACEMENT N/A 03/04/2024   Procedure: CREATION, TRACHEOSTOMY;  Surgeon: [name] Massie RAMAN, MD;  Location: [facility] ORS;  Service: ENT;  Laterality: N/A;    Current Medications: Current Facility-Administered Medications  Medication Dose Route Frequency Provider Last Rate Last Admin   acetaminophen  (TYLENOL ) tablet 650 mg  650 mg Oral Q6H PRN Hampton, Tracie B, NP   650 mg at 08/14/24 0239   alum & mag hydroxide-simeth (MAALOX/MYLANTA) 200-200-20 MG/5ML suspension 30 mL  30 mL Oral Q4H PRN Hampton, Tracie B, NP   30 mL at 08/05/24 0306   amiodarone  (PACERONE ) tablet 400 mg  400 mg Oral Daily [name], [name], MD   400 mg at 08/17/24 0920   ARIPiprazole  (ABILIFY ) tablet 15 mg  15 mg Oral Daily [name], [name], MD   15 mg at 08/17/24 0919   ARIPiprazole  ER (ABILIFY  MAINTENA) injection 400 mg  400 mg Intramuscular Q28 days Edword Cu, MD   400 mg at 07/27/24 9070   divalproex  (DEPAKOTE  ER) 24 hr tablet 1,000 mg  1,000 mg Oral QHS Brittnie Lewey, MD   1,000 mg at 08/16/24 2107   docusate sodium  (COLACE) capsule 100 mg  100 mg Oral Daily Rayshawn Maney, MD   100 mg at 08/17/24 0920   feeding  supplement (ENSURE PLUS HIGH PROTEIN) liquid 237 mL  237 mL Oral TID BM [name], [name], MD   237 mL at 08/17/24 0920   hydrOXYzine  (ATARAX ) tablet 25 mg  25 mg Oral TID PRN Hampton, Tracie B, NP   25 mg at 08/09/24 2117   magnesium  hydroxide (MILK OF MAGNESIA) suspension 30 mL  30 mL Oral Daily PRN Hampton, Tracie B, NP   30 mL at 08/08/24 2233   memantine  (NAMENDA ) tablet 10 mg  10 mg Oral Daily Ivannia Willhelm, MD   10 mg at 08/17/24 0920   metoprolol  tartrate (LOPRESSOR ) tablet 25 mg  25 mg Oral BID [name], [name], MD   25 mg at 08/17/24 0920   naproxen  (NAPROSYN ) tablet 250 mg  250 mg Oral  BID WC Isaish Alemu, MD   250 mg at 08/17/24 9170   OLANZapine  (ZYPREXA ) injection 5 mg  5 mg Intramuscular TID PRN Hampton, Tracie B, NP       OLANZapine  zydis (ZYPREXA ) disintegrating tablet 5 mg  5 mg Oral TID PRN Hampton, Tracie B, NP   5 mg at 08/04/24 1129   oxyCODONE  (Oxy IR/ROXICODONE ) immediate release tablet 5 mg  5 mg Oral Q4H PRN Dorinda Homans T, MD   5 mg at 08/12/24 0451   pantoprazole  (PROTONIX ) EC tablet 40 mg  40 mg Oral Daily [name], [name], MD   40 mg at 08/17/24 0920   traZODone  (DESYREL ) tablet 50 mg  50 mg Oral QHS PRN Hampton, Tracie B, NP   50 mg at 08/16/24 2107    Lab Results:  No results found for this or any previous visit (from the past 48 hours).    Blood Alcohol level:  Lab Results  Component Value Date   [facility] <15 07/17/2024   ETH <15 02/20/2024    Metabolic Disorder Labs: Lab Results  Component Value Date   HGBA1C 5.4 08/12/2024   MPG 108.28 08/12/2024   MPG 119.76 07/14/2018   No results found for: PROLACTIN Lab Results  Component Value Date   CHOL 139 08/12/2024   TRIG 73 08/12/2024   HDL 52 08/12/2024   CHOLHDL 2.7 08/12/2024   VLDL 15 08/12/2024   LDLCALC 72 08/12/2024   LDLCALC 119 (H) 07/13/2018    Physical Findings: AIMS:  , ,  ,  ,    CIWA:    COWS:      Psychiatric Specialty Exam:  Presentation  General Appearance: Appropriate  for Environment  Eye Contact:Fleeting  Speech:Normal Rate  Speech Volume:Decreased    Mood and Affect  Mood:Anxious  Affect:Flat   Thought Process  Thought Processes:circumstantial Orientation:Partial  Thought Content:discharge focused  Hallucinations:denies  Ideas of Reference:Delusions At baseline Suicidal Thoughts:denies  Homicidal Thoughts:denies   Sensorium  Memory:Immediate Poor; Recent Poor; Remote Poor  Judgment:Impaired  Insight:None   Executive Functions  Concentration:Poor  Attention Span:Poor  Recall:Poor  Fund of Knowledge:Poor  Language:Fair   Psychomotor Activity  Psychomotor Activity:No data recorded  Musculoskeletal: Strength & Muscle Tone: within normal limits Gait & Station: normal Assets  Assets:Communication Skills; Desire for Improvement; Social Support    Physical Exam: Physical Exam Vitals and nursing note reviewed.    ROS Blood pressure 105/70, pulse 77, temperature 97.9 F (36.6 C), resp. rate 17, height 6' 1 (1.854 m), weight 81.2 kg, SpO2 97%. Body mass index is 23.62 kg/m.  Diagnosis: Active Problems:   Schizophrenia, paranoid (HCC)   Dementia with behavioral disturbance (HCC)   PLAN: Safety and Monitoring:  -- Involuntary admission to inpatient psychiatric unit for safety, stabilization and treatment  -- Daily contact with patient to assess and evaluate symptoms and progress in treatment  -- Patient's case to be discussed in multi-disciplinary team meeting  -- Observation Level : q15 minute checks  -- Vital signs:  q12 hours  -- Precautions: suicide, elopement, and assault -- Encouraged patient to participate in unit milieu and in scheduled group therapies  2. Psychiatric Treatment:  Scheduled Medications:   Abilify  10 mg due to worsening agitation and paranoia even after LAI -Abilify  10 mg daily LAY Abilify  Maintena 400 mg q4 weeks given on 07/27/24  Cogentin  1mg  one dose given for EPS Depakote   ER increased to 1000 mg to help with mood stabilization as he was receiving multiple as needed  antipsychotics for agitation  -- The risks/benefits/side-effects/alternatives to this medication were discussed in detail with the patient and time was given for questions. The patient consents to medication trial.  3. Medical Issues Being Addressed:  [facility] consulted for wound care management   Assessment and Plan:   Sacral Decubitus Ulcer Full thickness decubitus ulcer in setting of recent extended hospitalization, discharged from [facility] Nov 14th Wound looks pretty good overall WOC on board we appreciate input   Cognitive Deficit Concern for anoxic brain injury at discharge 02/2024 Delirium precautions   Peripheral Vascular Disease Legs with features c/w peripheral vascular disease, diminished DP pulses Would benefit from outpatient ABI's   HFrEF Euvolemic at this time Continue home medications   Hx Atrial Flutter Not on any anticoagulation at this time EKG with NSR Continue amiodarone   Hypertension Noted, adjust meds gradually after med rec complete     4. Discharge Planning:   -- Social work and case management to assist with discharge planning and identification of hospital follow-up needs prior to discharge  -- Estimated LOS: 3-4 days  Allyn Foil, MD 08/17/2024, 12:40 PM

## 2024-08-17 NOTE — Progress Notes (Signed)
 Patient is pleasantly confused alert and oriented to x 2 reoriented to situation. Denies SI/HI/A/VH and verbally contracts for safety. Compliant with treatment no medication adverse issues noted. Support ongoing

## 2024-08-17 NOTE — Progress Notes (Signed)
" °   08/17/24 2200  Psych Admission Type (Psych Patients Only)  Admission Status Involuntary  Psychosocial Assessment  Patient Complaints None  Eye Contact Fair  Facial Expression Animated  Affect Appropriate to circumstance  Speech Tangential  Interaction Assertive  Motor Activity Slow  Appearance/Hygiene In scrubs  Behavior Characteristics Cooperative;Appropriate to situation  Mood Pleasant  Thought Process  Coherency Tangential  Content Preoccupation  Delusions None reported or observed  Perception WDL  Hallucination None reported or observed  Judgment Impaired  Confusion Mild  Danger to Self  Current suicidal ideation? Denies  Agreement Not to Harm Self Yes  Description of Agreement verbal  Danger to Others  Danger to Others None reported or observed   Mood/Behavior:  Pleasant and cooperative. Pt disoriented to situation and up a few times at the nurses station overnight.    Psych assessment: Denies SI/HI and AVH.     Interaction / Group attendance:  Present in the milieu. Interacting with peers and staff.  Attended group.   Medication/ PRNs: Compliant with scheduled medications. Required PRNs Trazodone  for sleep and noted effective.   Pain: Denies    15 min checks in place for safety. "

## 2024-08-17 NOTE — Group Note (Signed)
 Date:  08/17/2024 Time:  2:11 PM  Group Topic/Focus:  Healthy Communication:   The focus of this group is to discuss communication, barriers to communication, as well as healthy ways to communicate with others. Making Healthy Choices:   The focus of this group is to help patients identify negative/unhealthy choices they were using prior to admission and identify positive/healthier coping strategies to replace them upon discharge. Managing Feelings:   The focus of this group is to identify what feelings patients have difficulty handling and develop a plan to handle them in a healthier way upon discharge. Wellness Toolbox:   The focus of this group is to discuss various aspects of wellness, balancing those aspects and exploring ways to increase the ability to experience wellness.  Patients will create a wellness toolbox for use upon discharge.  The group completed worksheets focused on expressing how each patient was feeling, followed by a positive Would You Rather activity. Patients also participated in a Complete the Sentence exercise, a simple crossword, and brain teasers to encourage interaction and engagement. The group discussed healthy ways to cope with daily stressors and practiced sharing coping strategies.  Participation Level:  Minimal  Participation Quality:  Appropriate  Affect:  Appropriate  Cognitive:  Appropriate  Insight: Appropriate  Engagement in Group:  Limited  Modes of Intervention:  Activity and Discussion  Additional Comments:    Miguel [name] 08/17/2024, 2:11 PM

## 2024-08-17 NOTE — Group Note (Signed)
 Date:  08/17/2024 Time:  4:52 PM  Group Topic/Focus:  Building Self Esteem:   The Focus of this group is helping patients become aware of the effects of self-esteem on their lives, the things they and others do that enhance or undermine their self-esteem, seeing the relationship between their level of self-esteem and the choices they make and learning ways to enhance self-esteem.    Participation Level:  Did Not Attend   Arland Nutting 08/17/2024, 4:52 PM

## 2024-08-18 DIAGNOSIS — F03918 Unspecified dementia, unspecified severity, with other behavioral disturbance: Secondary | ICD-10-CM | POA: Diagnosis not present

## 2024-08-18 DIAGNOSIS — F200 Paranoid schizophrenia: Secondary | ICD-10-CM | POA: Diagnosis not present

## 2024-08-18 MED ORDER — SALINE SPRAY 0.65 % NA SOLN
1.0000 | NASAL | Status: AC | PRN
  Administered 2024-08-18: 1 via NASAL
  Filled 2024-08-18: qty 44

## 2024-08-18 NOTE — Progress Notes (Signed)
 [facility] MD Progress Note  08/18/2024 4:30 PM Miguel [name]  MRN:  [id number]  Miguel [name] is a 73 year old male with a past psychiatric history significant for schizophrenia who presents under involuntary commitment initiated by family due to medication non-adherence, increasing paranoia, and behavioral disorganization. 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 Brother reports multiple episodes of patient whispering through the night, stating people were talking about him and planning to harm him. Patient was advised by his ACT Team to come to the ED; he refused, prompting the family to pursue IVC.Patient is admitted to [facility] unit with Q15 min safety monitoring. Multidisciplinary team approach is offered. Medication management; group/milieu therapy is offered.  Subjective:  Chart reviewed, case discussed in multidisciplinary meeting, patient seen during rounds.   Patient is noted to be walking in the hallway.  He remains confused and answers questions very randomly with unrelated things.  He remains fixated on his room not being clean and his wound care thinking somebody is coming every day trying to remove the bandage.  He denies auditory/visual hallucinations and displays safe behaviors on the unit but he is unable to comprehend any questions linearly and his responses are very random.  Patient is taking medications with no reported side effects  Past Psychiatric History: see h&P Family History:  Family History  Family history unknown: Yes   Social History:  Social  History   Substance and Sexual Activity  Alcohol Use No     Social History   Substance and Sexual Activity  Drug Use No    Social History   Socioeconomic History   Marital status: Divorced    Spouse name: Not on file   Number of children: Not on file   Years of education: Not on file   Highest education level: Not on file  Occupational History   Not on file  Tobacco Use   Smoking status: Every Day    Current packs/day: 0.50    Average packs/day: 0.5 packs/day for 46.0 years (23.0 ttl pk-yrs)    Types: Cigarettes    Start date: 5   Smokeless tobacco: Never  Vaping Use   Vaping status: Never Used  Substance and Sexual Activity   Alcohol use: No   Drug use: No   Sexual activity: Not on file  Other Topics Concern   Not on file  Social History Narrative   Not on file   Social Drivers of Health   Tobacco Use: High Risk (07/18/2024)   Patient History    Smoking Tobacco Use: Every Day    Smokeless Tobacco Use: Never    Passive Exposure: Not on file  Financial Resource Strain: Not on file  Food Insecurity: Patient Unable To Answer (07/18/2024)   Epic    Worried About Programme Researcher, Broadcasting/film/video in the Last Year: Patient unable to answer    Ran Out of Food in the Last Year: Patient unable to answer  Transportation Needs: Unmet Transportation Needs (07/18/2024)   Epic    Lack of Transportation (Medical): Yes    Lack of Transportation (Non-Medical): Yes  Physical Activity: Not on file  Stress: Not on file  Social Connections: Patient Unable To Answer (07/18/2024)  Social Connection and Isolation Panel    Frequency of Communication with Friends and Family: Patient unable to answer    Frequency of Social Gatherings with Friends and Family: Patient unable to answer    Attends Religious Services: Patient unable to answer    Active Member of Clubs or Organizations: Patient unable to answer    Attends Banker Meetings: Patient unable to answer    Marital Status:  Patient unable to answer  Depression (PHQ2-9): Not on file  Alcohol Screen: Low Risk (07/18/2024)   Alcohol Screen    Last Alcohol Screening Score (AUDIT): 0  Housing: Unknown (07/18/2024)   Epic    Unable to Pay for Housing in the Last Year: Patient unable to answer    Number of Times Moved in the Last Year: 0    Homeless in the Last Year: Patient declined  Utilities: Patient Unable To Answer (07/18/2024)   Epic    Threatened with loss of utilities: Patient unable to answer  Health Literacy: Not on file   Past Medical History:  Past Medical History:  Diagnosis Date   Biventricular failure (HCC)    CVA (cerebral vascular accident) (HCC)    Polysubstance use disorder    Schizophrenia (HCC)     Past Surgical History:  Procedure Laterality Date   APPENDECTOMY     TRACHEOSTOMY TUBE PLACEMENT N/A 03/04/2024   Procedure: CREATION, TRACHEOSTOMY;  Surgeon: [name] Massie RAMAN, MD;  Location: [facility] ORS;  Service: ENT;  Laterality: N/A;    Current Medications: Current Facility-Administered Medications  Medication Dose Route Frequency Provider Last Rate Last Admin   acetaminophen  (TYLENOL ) tablet 650 mg  650 mg Oral Q6H PRN Hampton, Tracie B, NP   650 mg at 08/14/24 0239   alum & mag hydroxide-simeth (MAALOX/MYLANTA) 200-200-20 MG/5ML suspension 30 mL  30 mL Oral Q4H PRN Hampton, Tracie B, NP   30 mL at 08/05/24 0306   amiodarone  (PACERONE ) tablet 400 mg  400 mg Oral Daily [name], [name], MD   400 mg at 08/18/24 9096   ARIPiprazole  (ABILIFY ) tablet 15 mg  15 mg Oral Daily [name], [name], MD   15 mg at 08/18/24 9096   ARIPiprazole  ER (ABILIFY  MAINTENA) injection 400 mg  400 mg Intramuscular Q28 days Aisling Emigh, MD   400 mg at 07/27/24 9070   divalproex  (DEPAKOTE  ER) 24 hr tablet 1,000 mg  1,000 mg Oral QHS Lajoyce Tamura, MD   1,000 mg at 08/17/24 2113   docusate sodium  (COLACE) capsule 100 mg  100 mg Oral Daily Shahana Capes, MD   100 mg at 08/18/24 0900   feeding supplement (ENSURE  PLUS HIGH PROTEIN) liquid 237 mL  237 mL Oral TID BM [name], [name], MD   237 mL at 08/18/24 1417   hydrOXYzine  (ATARAX ) tablet 25 mg  25 mg Oral TID PRN Hampton, Tracie B, NP   25 mg at 08/18/24 0224   magnesium  hydroxide (MILK OF MAGNESIA) suspension 30 mL  30 mL Oral Daily PRN Hampton, Tracie B, NP   30 mL at 08/08/24 2233   memantine  (NAMENDA ) tablet 10 mg  10 mg Oral Daily Lillymae Duet, MD   10 mg at 08/18/24 9096   metoprolol  tartrate (LOPRESSOR ) tablet 25 mg  25 mg Oral BID [name], [name], MD   25 mg at 08/18/24 0900   naproxen  (NAPROSYN ) tablet 250 mg  250 mg Oral BID WC Charmin Aguiniga, MD   250 mg at 08/18/24 0901   OLANZapine  (ZYPREXA )  injection 5 mg  5 mg Intramuscular TID PRN Hampton, Tracie B, NP       OLANZapine  zydis (ZYPREXA ) disintegrating tablet 5 mg  5 mg Oral TID PRN Hampton, Tracie B, NP   5 mg at 08/04/24 1129   oxyCODONE  (Oxy IR/ROXICODONE ) immediate release tablet 5 mg  5 mg Oral Q4H PRN Dorinda Homans T, MD   5 mg at 08/12/24 0451   pantoprazole  (PROTONIX ) EC tablet 40 mg  40 mg Oral Daily [name], [name], MD   40 mg at 08/18/24 9096   sodium chloride  (OCEAN) 0.65 % nasal spray 1 spray  1 spray Each Nare PRN Donnelly Mellow, MD   1 spray at 08/18/24 1459   traZODone  (DESYREL ) tablet 50 mg  50 mg Oral QHS PRN Hampton, Tracie B, NP   50 mg at 08/17/24 2113    Lab Results:  No results found for this or any previous visit (from the past 48 hours).    Blood Alcohol level:  Lab Results  Component Value Date   [facility] <15 07/17/2024   ETH <15 02/20/2024    Metabolic Disorder Labs: Lab Results  Component Value Date   HGBA1C 5.4 08/12/2024   MPG 108.28 08/12/2024   MPG 119.76 07/14/2018   No results found for: PROLACTIN Lab Results  Component Value Date   CHOL 139 08/12/2024   TRIG 73 08/12/2024   HDL 52 08/12/2024   CHOLHDL 2.7 08/12/2024   VLDL 15 08/12/2024   LDLCALC 72 08/12/2024   LDLCALC 119 (H) 07/13/2018    Physical Findings: AIMS:  , ,  ,  ,     CIWA:    COWS:      Psychiatric Specialty Exam:  Presentation  General Appearance: Appropriate for Environment  Eye Contact:Fleeting  Speech:Normal Rate  Speech Volume:Decreased    Mood and Affect  Mood:Anxious  Affect:Flat   Thought Process  Thought Processes:circumstantial Orientation:Partial  Thought Content:discharge focused  Hallucinations:denies  Ideas of Reference:Delusions At baseline Suicidal Thoughts:denies  Homicidal Thoughts:denies   Sensorium  Memory:Immediate Poor; Recent Poor; Remote Poor  Judgment:Impaired  Insight:None   Executive Functions  Concentration:Poor  Attention Span:Poor  Recall:Poor  Fund of Knowledge:Poor  Language:Fair   Psychomotor Activity  Psychomotor Activity:No data recorded  Musculoskeletal: Strength & Muscle Tone: within normal limits Gait & Station: normal Assets  Assets:Communication Skills; Desire for Improvement; Social Support    Physical Exam: Physical Exam Vitals and nursing note reviewed.    ROS Blood pressure 112/65, pulse 63, temperature 97.9 F (36.6 C), resp. rate 17, height 6' 1 (1.854 m), weight 81.2 kg, SpO2 100%. Body mass index is 23.62 kg/m.  Diagnosis: Active Problems:   Schizophrenia, paranoid (HCC)   Dementia with behavioral disturbance (HCC)   PLAN: Safety and Monitoring:  -- Involuntary admission to inpatient psychiatric unit for safety, stabilization and treatment  -- Daily contact with patient to assess and evaluate symptoms and progress in treatment  -- Patient's case to be discussed in multi-disciplinary team meeting  -- Observation Level : q15 minute checks  -- Vital signs:  q12 hours  -- Precautions: suicide, elopement, and assault -- Encouraged patient to participate in unit milieu and in scheduled group therapies  2. Psychiatric Treatment:  Scheduled Medications:   Abilify  10 mg due to worsening agitation and paranoia even after LAI -Abilify  10 mg  daily LAY Abilify  Maintena 400 mg q4 weeks given on 07/27/24  Cogentin  1mg  one dose given for EPS Depakote  ER increased to 1000 mg to  help with mood stabilization as he was receiving multiple as needed antipsychotics for agitation  -- The risks/benefits/side-effects/alternatives to this medication were discussed in detail with the patient and time was given for questions. The patient consents to medication trial.  3. Medical Issues Being Addressed:  [facility] consulted for wound care management   Assessment and Plan:   Sacral Decubitus Ulcer Full thickness decubitus ulcer in setting of recent extended hospitalization, discharged from [facility] Wound looks pretty good overall WOC on board we appreciate input   Cognitive Deficit Concern for anoxic brain injury at discharge 02/2024 Delirium precautions   Peripheral Vascular Disease Legs with features c/w peripheral vascular disease, diminished DP pulses Would benefit from outpatient ABI's   HFrEF Euvolemic at this time Continue home medications   Hx Atrial Flutter Not on any anticoagulation at this time EKG with NSR Continue amiodarone   Hypertension Noted, adjust meds gradually after med rec complete     4. Discharge Planning:   -- Social work and case management to assist with discharge planning and identification of hospital follow-up needs prior to discharge  -- Estimated LOS: 3-4 days  Ameir Faria, MD 08/18/2024, 4:30 PM

## 2024-08-18 NOTE — Group Note (Signed)
 LCSW Group Therapy Note   Group Date: 08/18/2024 Start Time: 1300 End Time: 1330   Type of Therapy and Topic:  Group Therapy: Challenging Core Beliefs  Participation Level:  Did Not Attend  Description of Group:  Patients were educated about core beliefs and asked to identify one harmful core belief that they have. Patients were asked to explore from where those beliefs originate. Patients were asked to discuss how those beliefs make them feel and the resulting behaviors of those beliefs. They were then be asked if those beliefs are true and, if so, what evidence they have to support them. Lastly, group members were challenged to replace those negative core beliefs with helpful beliefs.   Therapeutic Goals:   1. Patient will identify harmful core beliefs and explore the origins of such beliefs. 2. Patient will identify feelings and behaviors that result from those core beliefs. 3. Patient will discuss whether such beliefs are true. 4.  Patient will replace harmful core beliefs with helpful ones.  Summary of Patient Progress: X  Therapeutic Modalities: Cognitive Behavioral Therapy; Solution-Focused Therapy   Miguel [name] D Estela Vinal, [place] 08/18/2024  2:01 PM

## 2024-08-18 NOTE — Group Note (Signed)
 Date:  08/18/2024 Time:  4:28 PM  Group Topic/Focus:    Coloring offers geriatric patients significant benefits, including reducing stress and anxiety, improving fine motor skills (dexterity, hand-eye coordination), boosting cognitive function (focus, memory), providing creative self-expression, fighting boredom, and encouraging social interaction, all while fostering a sense of accomplishment and combating loneliness, especially for those with dementia or limited mobility.  Coloring induces a meditative state, channeling focus and interrupting negative thought patterns, which lowers anxiety and calms the mind.It can boost serotonin levels, leading to better moods, and help seniors forget aches and pains temporarily.  Participation Level:  Active  Participation Quality:  Appropriate  Affect:  Appropriate  Cognitive:  Appropriate  Insight: Appropriate  Engagement in Group:  Engaged  Modes of Intervention:  Activity  Additional Comments:  N/A  [name] 08/18/2024, 4:28 PM

## 2024-08-18 NOTE — Progress Notes (Signed)
"    Patient pleasant and cooperative. Compliant with medications. Attended group therapy. Patient denies SI, HI, and AVH. Safety maintained on the unit.     08/18/24 1000  Psych Admission Type (Psych Patients Only)  Admission Status Involuntary  Psychosocial Assessment  Patient Complaints None  Eye Contact Fair  Facial Expression Animated  Affect Appropriate to circumstance  Speech Tangential  Interaction Assertive  Motor Activity Slow  Appearance/Hygiene In scrubs  Behavior Characteristics Appropriate to situation;Cooperative  Mood Pleasant  Thought Process  Coherency Tangential  Content Preoccupation  Delusions None reported or observed  Perception WDL  Hallucination None reported or observed  Judgment Impaired  Confusion Mild  Danger to Self  Current suicidal ideation? Denies    "

## 2024-08-18 NOTE — Group Note (Signed)
 Date:  08/18/2024 Time:  8:37 PM  Group Topic/Focus:  Wrap-Up Group:   The focus of this group is to help patients review their daily goal of treatment and discuss progress on daily workbooks.    Participation Level:  Active  Participation Quality:  Appropriate  Affect:  Appropriate  Cognitive:  Appropriate  Insight: Appropriate  Engagement in Group:  Engaged  Modes of Intervention:  Discussion  Additional Comments:    Miguel [name] 08/18/2024, 8:37 PM

## 2024-08-18 NOTE — Plan of Care (Signed)
   Problem: Coping: Goal: Ability to verbalize frustrations and anger appropriately will improve Outcome: Progressing

## 2024-08-18 NOTE — Group Note (Signed)
 Recreation Therapy Group Note   Group Topic:Leisure Education  Group Date: 08/18/2024 Start Time: 1400 End Time: 1450 Facilitators: Celestia Jeoffrey BRAVO, LRT, CTRS Location: Courtyard  Group Description: Music. Patients are encouraged to name their favorite song(s) for LRT to play song through speaker for group to hear, while in the courtyard getting fresh air and sunlight. Patients educated on the definition of leisure and the importance of having different leisure interests outside of the hospital. Group discussed how leisure activities can often be used as pharmacologist and that listening to music and being outside are examples.    Goal Area(s) Addressed:  Patient will identify a current leisure interest.  Patient will practice making a positive decision. Patient will have the opportunity to try a new leisure activity.   Affect/Mood: Appropriate   Participation Level: Moderate    Clinical Observations/Individualized Feedback: Miguel [name] came late to group. Pt was pleasant when interacting.   Plan: Continue to engage patient in RT group sessions 2-3x/week.   Jeoffrey BRAVO Celestia, LRT, CTRS 08/18/2024 4:31 PM

## 2024-08-18 NOTE — Group Note (Signed)
 Date:  08/18/2024 Time:  2:49 PM  Group Topic/Focus:  Early Warning Signs:   The focus of this group is to help patients identify signs or symptoms they exhibit before slipping into an unhealthy state or crisis.    Participation Level:  Active  Participation Quality:  Appropriate  Affect:  Appropriate  Cognitive:  Appropriate  Insight: Appropriate  Engagement in Group:  Engaged  Modes of Intervention:  Discussion   Miguel [name] 08/18/2024, 2:49 PM

## 2024-08-19 DIAGNOSIS — F200 Paranoid schizophrenia: Secondary | ICD-10-CM | POA: Diagnosis not present

## 2024-08-19 DIAGNOSIS — F03918 Unspecified dementia, unspecified severity, with other behavioral disturbance: Secondary | ICD-10-CM | POA: Diagnosis not present

## 2024-08-19 NOTE — Progress Notes (Signed)
 Behavior:  Mil confusion.  Falling asleep in dayroom.  Pleasant and cooperative.   Psych assessment:  Denies SI/HI and AVH.  Interaction / Group attendance:  Present in the milieu.  Appropriate interaction with peers and staff.  Attends groups.  Medication/ PRNs: Compliant.  Cardiac medications held.  Dr. JINNY aware.   Pain: Denies.  15 min checks in place for safety.    Wound cleaned and dressing changed per wound care orders.

## 2024-08-19 NOTE — Plan of Care (Signed)
°  Problem: Education: Goal: Knowledge of [facility] Education information/materials will improve Outcome: Progressing   Problem: Activity: Goal: Interest or engagement in activities will improve Outcome: Progressing   Problem: Coping: Goal: Ability to verbalize frustrations and anger appropriately will improve Outcome: Progressing   Problem: Education: Goal: Knowledge of [place] Education information/materials will improve Outcome: Progressing Goal: Emotional status will improve Outcome: Progressing Goal: Mental status will improve Outcome: Progressing Goal: Verbalization of understanding the information provided will improve Outcome: Progressing   Problem: Activity: Goal: Interest or engagement in activities will improve Outcome: Progressing Goal: Sleeping patterns will improve Outcome: Progressing

## 2024-08-19 NOTE — BHH Counselor (Signed)
 Placement Update:   Pt currently awaiting guardianship (pt's brother Cherene had a court date for guardianship on 08/17/24).   CSW will reach out to court system following the holidays to confirm the results of the hearing on guardianship.   Pt is in need of placement. CSW sent FL2 to pt's ACT team ([facility]) to begin the process of placement.   Pt's ACT team lead, Romero, reports that at this time they have not been able to locate a bed for pt.   CSW will inquire about updates regarding placement search.   Lum Croft, MSW, [place] 08/19/2024 1:06 PM

## 2024-08-19 NOTE — Group Note (Signed)
 Recreation Therapy Group Note   Group Topic:Communication  Group Date: 08/19/2024 Start Time: 1250 End Time: 1325 Facilitators: Celestia Jeoffrey BRAVO, LRT, CTRS Location: Dayroom  Group Description: LRT and NT, Leita, passed out large Christmas bags filled with 4-6 individually wrapped presents inside for the patient to unwrap. Christmas music was being played in the background. LRT, NT, and patients discussed past Christmas traditions and fun memories they have made while communicating with one another in the dayroom.   Goal Area(s) addressed: Patient will increase communication.  Patient will reminisce on past memory Patient will practice gratitude.    Affect/Mood: Appropriate and Flat   Participation Level: Moderate   Participation Quality: Independent   Behavior: Calm and Cooperative   Speech/Thought Process: Coherent   Insight: Fair   Judgement: Fair    Modes of Intervention: Activity, Music, and Open Conversation   Patient Response to Interventions:  Receptive   Education Outcome:  In group clarification offered    Clinical Observations/Individualized Feedback: Archer was active in their participation of session activities and group discussion. Pt interacted well with LRT and peers duration of session.    Plan: Continue to engage patient in RT group sessions 2-3x/week.   [address], LRT, CTRS 08/19/2024 1:42 PM

## 2024-08-19 NOTE — Group Note (Signed)
 Date:  08/19/2024 Time:  2:00 PM  Group Topic/Focus:  Self Care:   The focus of this group is to help patients understand the importance of self-care in order to improve or restore emotional, physical, spiritual, interpersonal, and financial health.    Participation Level:  Active  Participation Quality:  Appropriate and Attentive  Affect:  Appropriate  Cognitive:  Alert and Appropriate  Insight: Appropriate  Engagement in Group:  Engaged  Modes of Intervention:  Activity and Orientation  Additional Comments:  Christmas party    Miguel [name],Miguel [name] 08/19/2024, 2:00 PM

## 2024-08-19 NOTE — Group Note (Signed)
 Date:  08/19/2024 Time:  3:26 PM  Group Topic/Focus:  Goals Group:   The focus of this group is to help patients establish daily goals to achieve during treatment and discuss how the patient can incorporate goal setting into their daily lives to aide in recovery. Managing Feelings:   The focus of this group is to identify what feelings patients have difficulty handling and develop a plan to handle them in a healthier way upon discharge.    Participation Level:  Active  Participation Quality:  Appropriate and Attentive  Affect:  Appropriate  Cognitive:  Alert  Insight: Appropriate  Engagement in Group:  Engaged  Modes of Intervention:  Activity  Additional Comments:   COURTYARD AND HOT COCO   [name],Miguel Milleson E 08/19/2024, 3:26 PM

## 2024-08-19 NOTE — Progress Notes (Signed)
 [facility] MD Progress Note  08/19/2024 11:54 AM ADARSH MUNDORF  MRN:  [id number]  Jarvin Ogren. Tokarz is a 73 year old male with a past psychiatric history significant for schizophrenia who presents under involuntary commitment initiated by family due to medication non-adherence, increasing paranoia, and behavioral disorganization. 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 Brother reports multiple episodes of patient whispering through the night, stating people were talking about him and planning to harm him. Patient was advised by his ACT Team to come to the ED; he refused, prompting the family to pursue IVC.Patient is admitted to [facility] unit with Q15 min safety monitoring. Multidisciplinary team approach is offered. Medication management; group/milieu therapy is offered.  Subjective:  Chart reviewed, case discussed in multidisciplinary meeting, patient seen during rounds.   Patient is noted to be walking in the hallway.  He remains disorganized and is unable to answer the questions linearly.  He continues to express his frustration about the staff coming into his room and talks about the wound on his back hurting him a lot.  Patient was encouraged to allow the nurses to do the dressing changes of his sacral ulcer.  Patient is unable to understand the medical necessity for wound care.  He denies SI/HI/plan and is not responding to internal stimuli but remains paranoid and delusional.  Past Psychiatric History: see h&P Family History:  Family History  Family history unknown: Yes    Social History:  Social History   Substance and Sexual Activity  Alcohol Use No     Social History   Substance and Sexual Activity  Drug Use No    Social History   Socioeconomic History   Marital status: Divorced    Spouse name: Not on file   Number of children: Not on file   Years of education: Not on file   Highest education level: Not on file  Occupational History   Not on file  Tobacco Use   Smoking status: Every Day    Current packs/day: 0.50    Average packs/day: 0.5 packs/day for 46.0 years (23.0 ttl pk-yrs)    Types: Cigarettes    Start date: 53   Smokeless tobacco: Never  Vaping Use   Vaping status: Never Used  Substance and Sexual Activity   Alcohol use: No   Drug use: No   Sexual activity: Not on file  Other Topics Concern   Not on file  Social History Narrative   Not on file   Social Drivers of Health   Tobacco Use: High Risk (07/18/2024)   Patient History    Smoking Tobacco Use: Every Day    Smokeless Tobacco Use: Never    Passive Exposure: Not on file  Financial Resource Strain: Not on file  Food Insecurity: Patient Unable To Answer (07/18/2024)   Epic    Worried About Programme Researcher, Broadcasting/film/video in the Last Year: Patient unable to answer    Ran Out of Food in the Last Year: Patient unable to answer  Transportation Needs: Unmet Transportation Needs (07/18/2024)   Epic    Lack of Transportation (Medical): Yes    Lack of Transportation (Non-Medical): Yes  Physical Activity: Not on file  Stress: Not on  file  Social Connections: Patient Unable To Answer (07/18/2024)   Social Connection and Isolation Panel    Frequency of Communication with Friends and Family: Patient unable to answer    Frequency of Social Gatherings with Friends and Family: Patient unable to answer    Attends Religious Services: Patient unable to answer    Active Member of Clubs or Organizations: Patient unable to answer    Attends Banker Meetings: Patient unable  to answer    Marital Status: Patient unable to answer  Depression (PHQ2-9): Not on file  Alcohol Screen: Low Risk (07/18/2024)   Alcohol Screen    Last Alcohol Screening Score (AUDIT): 0  Housing: Unknown (07/18/2024)   Epic    Unable to Pay for Housing in the Last Year: Patient unable to answer    Number of Times Moved in the Last Year: 0    Homeless in the Last Year: Patient declined  Utilities: Patient Unable To Answer (07/18/2024)   Epic    Threatened with loss of utilities: Patient unable to answer  Health Literacy: Not on file   Past Medical History:  Past Medical History:  Diagnosis Date   Biventricular failure (HCC)    CVA (cerebral vascular accident) (HCC)    Polysubstance use disorder    Schizophrenia (HCC)     Past Surgical History:  Procedure Laterality Date   APPENDECTOMY     TRACHEOSTOMY TUBE PLACEMENT N/A 03/04/2024   Procedure: CREATION, TRACHEOSTOMY;  Surgeon: [name] Massie RAMAN, MD;  Location: [facility] ORS;  Service: ENT;  Laterality: N/A;    Current Medications: Current Facility-Administered Medications  Medication Dose Route Frequency Provider Last Rate Last Admin   acetaminophen  (TYLENOL ) tablet 650 mg  650 mg Oral Q6H PRN Hampton, Tracie B, NP   650 mg at 08/14/24 0239   alum & mag hydroxide-simeth (MAALOX/MYLANTA) 200-200-20 MG/5ML suspension 30 mL  30 mL Oral Q4H PRN Hampton, Tracie B, NP   30 mL at 08/05/24 0306   amiodarone  (PACERONE ) tablet 400 mg  400 mg Oral Daily [name], [name], MD   400 mg at 08/18/24 9096   ARIPiprazole  (ABILIFY ) tablet 15 mg  15 mg Oral Daily [name], [name], MD   15 mg at 08/19/24 0857   ARIPiprazole  ER (ABILIFY  MAINTENA) injection 400 mg  400 mg Intramuscular Q28 days Artie Mcintyre, MD   400 mg at 07/27/24 9070   divalproex  (DEPAKOTE  ER) 24 hr tablet 1,000 mg  1,000 mg Oral QHS Lisbeth Puller, MD   1,000 mg at 08/18/24 2022   docusate sodium  (COLACE) capsule 100 mg  100 mg Oral Daily Airon Sahni, MD   100 mg at 08/19/24 0858    feeding supplement (ENSURE PLUS HIGH PROTEIN) liquid 237 mL  237 mL Oral TID BM [name], [name], MD   237 mL at 08/19/24 1117   hydrOXYzine  (ATARAX ) tablet 25 mg  25 mg Oral TID PRN Hampton, Tracie B, NP   25 mg at 08/18/24 0224   magnesium  hydroxide (MILK OF MAGNESIA) suspension 30 mL  30 mL Oral Daily PRN Hampton, Tracie B, NP   30 mL at 08/08/24 2233   memantine  (NAMENDA ) tablet 10 mg  10 mg Oral Daily Saveah Bahar, MD   10 mg at 08/19/24 9141   metoprolol  tartrate (LOPRESSOR ) tablet 25 mg  25 mg Oral BID [name], [name], MD   25 mg at 08/18/24 2022   naproxen  (NAPROSYN ) tablet 250 mg  250 mg Oral BID WC Eh Sesay, MD  250 mg at 08/19/24 0857   OLANZapine  (ZYPREXA ) injection 5 mg  5 mg Intramuscular TID PRN Hampton, Tracie B, NP       OLANZapine  zydis (ZYPREXA ) disintegrating tablet 5 mg  5 mg Oral TID PRN Hampton, Tracie B, NP   5 mg at 08/04/24 1129   oxyCODONE  (Oxy IR/ROXICODONE ) immediate release tablet 5 mg  5 mg Oral Q4H PRN Dorinda Homans T, MD   5 mg at 08/12/24 0451   pantoprazole  (PROTONIX ) EC tablet 40 mg  40 mg Oral Daily [name], [name], MD   40 mg at 08/18/24 9096   sodium chloride  (OCEAN) 0.65 % nasal spray 1 spray  1 spray Each Nare PRN Donnelly Mellow, MD   1 spray at 08/18/24 1459   traZODone  (DESYREL ) tablet 50 mg  50 mg Oral QHS PRN Hampton, Tracie B, NP   50 mg at 08/17/24 2113    Lab Results:  No results found for this or any previous visit (from the past 48 hours).    Blood Alcohol level:  Lab Results  Component Value Date   [facility] <15 07/17/2024   ETH <15 02/20/2024    Metabolic Disorder Labs: Lab Results  Component Value Date   HGBA1C 5.4 08/12/2024   MPG 108.28 08/12/2024   MPG 119.76 07/14/2018   No results found for: PROLACTIN Lab Results  Component Value Date   CHOL 139 08/12/2024   TRIG 73 08/12/2024   HDL 52 08/12/2024   CHOLHDL 2.7 08/12/2024   VLDL 15 08/12/2024   LDLCALC 72 08/12/2024   LDLCALC 119 (H) 07/13/2018    Physical  Findings: AIMS:  , ,  ,  ,    CIWA:    COWS:      Psychiatric Specialty Exam:  Presentation  General Appearance: Appropriate for Environment  Eye Contact:Fleeting  Speech:Normal Rate  Speech Volume:Decreased    Mood and Affect  Mood:Anxious  Affect:Flat   Thought Process  Thought Processes:circumstantial Orientation:Partial  Thought Content:discharge focused  Hallucinations:denies  Ideas of Reference:Delusions At baseline Suicidal Thoughts:denies  Homicidal Thoughts:denies   Sensorium  Memory:Immediate Poor; Recent Poor; Remote Poor  Judgment:Impaired  Insight:None   Executive Functions  Concentration:Poor  Attention Span:Poor  Recall:Poor  Fund of Knowledge:Poor  Language:Fair   Psychomotor Activity  Psychomotor Activity:No data recorded  Musculoskeletal: Strength & Muscle Tone: within normal limits Gait & Station: normal Assets  Assets:Communication Skills; Desire for Improvement; Social Support    Physical Exam: Physical Exam Vitals and nursing note reviewed.    ROS Blood pressure (!) 104/59, pulse 64, temperature 98.8 F (37.1 C), temperature source Oral, resp. rate 16, height 6' 1 (1.854 m), weight 81.2 kg, SpO2 97%. Body mass index is 23.62 kg/m.  Diagnosis: Active Problems:   Schizophrenia, paranoid (HCC)   Dementia with behavioral disturbance (HCC)   PLAN: Safety and Monitoring:  -- Involuntary admission to inpatient psychiatric unit for safety, stabilization and treatment  -- Daily contact with patient to assess and evaluate symptoms and progress in treatment  -- Patient's case to be discussed in multi-disciplinary team meeting  -- Observation Level : q15 minute checks  -- Vital signs:  q12 hours  -- Precautions: suicide, elopement, and assault -- Encouraged patient to participate in unit milieu and in scheduled group therapies  2. Psychiatric Treatment:  Scheduled Medications:   Abilify  10 mg due to worsening  agitation and paranoia even after LAI -Abilify  10 mg daily LAY Abilify  Maintena 400 mg q4 weeks given on 07/27/24  Cogentin   1mg  one dose given for EPS Depakote  ER increased to 1000 mg to help with mood stabilization as he was receiving multiple as needed antipsychotics for agitation  -- The risks/benefits/side-effects/alternatives to this medication were discussed in detail with the patient and time was given for questions. The patient consents to medication trial.  3. Medical Issues Being Addressed:  [facility] consulted for wound care management   Assessment and Plan:   Sacral Decubitus Ulcer Full thickness decubitus ulcer in setting of recent extended hospitalization, discharged from [facility] 14[facility] Wound looks pretty good overall WOC on board we appreciate input   Cognitive Deficit Concern for anoxic brain injury at discharge 02/2024 Delirium precautions   Peripheral Vascular Disease Legs with features c/w peripheral vascular disease, diminished DP pulses Would benefit from outpatient ABI's   HFrEF Euvolemic at this time Continue home medications   Hx Atrial Flutter Not on any anticoagulation at this time EKG with NSR Continue amiodarone   Hypertension Noted, adjust meds gradually after med rec complete     4. Discharge Planning:   -- Social work and case management to assist with discharge planning and identification of hospital follow-up needs prior to discharge  -- Estimated LOS: 3-4 days  Allyn Foil, MD 08/19/2024, 11:54 AM

## 2024-08-19 NOTE — Plan of Care (Signed)
" °  Problem: Activity: Goal: Interest or engagement in activities will improve Outcome: Progressing   Problem: Activity: Goal: Interest or engagement in activities will improve Outcome: Progressing   Problem: Coping: Goal: Ability to demonstrate self-control will improve Outcome: Progressing   "

## 2024-08-19 NOTE — Group Note (Signed)
 Date:  08/19/2024 Time:  9:27 PM  Group Topic/Focus:  Wrap-Up Group:   The focus of this group is to help patients review their daily goal of treatment and discuss progress on daily workbooks.    Participation Level:  Active  Participation Quality:  Appropriate  Affect:  Appropriate  Cognitive:  Alert  Insight: Appropriate  Engagement in Group:  Engaged  Modes of Intervention:  Discussion  Additional Comments:    Miguel [name] 08/19/2024, 9:27 PM

## 2024-08-20 DIAGNOSIS — F03918 Unspecified dementia, unspecified severity, with other behavioral disturbance: Secondary | ICD-10-CM | POA: Diagnosis not present

## 2024-08-20 DIAGNOSIS — F200 Paranoid schizophrenia: Secondary | ICD-10-CM | POA: Diagnosis not present

## 2024-08-20 NOTE — Progress Notes (Signed)
" °   08/20/24 0827  Psych Admission Type (Psych Patients Only)  Admission Status Involuntary  Psychosocial Assessment  Patient Complaints Other (Comment) (pt doesnt remember that he has a cardiac hx)  Eye Contact Brief  Facial Expression Flat  Affect Flat  Speech Argumentative  Interaction Assertive  Motor Activity Slow  Appearance/Hygiene Body odor  Behavior Characteristics Resistant to care  Mood Irritable  Aggressive Behavior  Effect No apparent injury  Thought Process  Coherency Tangential  Content Preoccupation  Delusions None reported or observed  Perception WDL  Hallucination None reported or observed  Judgment Poor  Confusion Mild  Danger to Self  Current suicidal ideation? Denies  Agreement Not to Harm Self Yes  Description of Agreement Verbal  Danger to Others  Danger to Others None reported or observed    "

## 2024-08-20 NOTE — Group Note (Signed)
 [facility] LCSW Group Therapy Note   Group Date: 08/20/2024 Start Time: 1300 End Time: 1400  Type of Therapy/Topic:  Group Therapy:  Feelings about Diagnosis  Participation Level:  Active   Mood: Appropriate    Description of Group:    This group will allow patients to explore their thoughts and feelings about diagnoses they have received. Patients will be guided to explore their level of understanding and acceptance of these diagnoses. Facilitator will encourage patients to process their thoughts and feelings about the reactions of others to their diagnosis, and will guide patients in identifying ways to discuss their diagnosis with significant others in their lives. This group will be process-oriented, with patients participating in exploration of their own experiences as well as giving and receiving support and challenge from other group members.   Therapeutic Goals: 1. Patient will demonstrate understanding of diagnosis as evidence by identifying two or more symptoms of the disorder:  2. Patient will be able to express two feelings regarding the diagnosis 3. Patient will demonstrate ability to communicate their needs through discussion and/or role plays  Summary of Patient Progress: Participants reflected on their emotions related to the holidays, using the occasion as a chance to engage in a life review.  Patients played and sang their most cherished nostalgic songs to evoke a sense of comfort and happiness.  Patients created a supportive environment where everyone felt safe to open up, share, and explore their emotions.    Therapeutic Modalities:   Cognitive Behavioral Therapy Brief Therapy Feelings Identification    Miguel CHRISTELLA Kerns, LCSW

## 2024-08-20 NOTE — Progress Notes (Signed)
" °   08/20/24 0135  Psych Admission Type (Psych Patients Only)  Admission Status Involuntary  Psychosocial Assessment  Patient Complaints None  Eye Contact Other (Comment) (wnl)  Facial Expression Flat  Affect Flat  Speech Tangential  Interaction Assertive  Motor Activity Slow  Appearance/Hygiene Body odor  Behavior Characteristics Cooperative;Appropriate to situation  Mood Pleasant  Thought Process  Coherency Tangential  Content Preoccupation  Delusions None reported or observed  Perception WDL  Hallucination None reported or observed  Judgment Poor  Confusion Mild  Danger to Self  Current suicidal ideation? Denies  Agreement Not to Harm Self Yes  Description of Agreement Verbal  Danger to Others  Danger to Others None reported or observed    "

## 2024-08-20 NOTE — Plan of Care (Signed)
 Miguel [name] is a 73 y.o. male patient. No diagnosis found. Past Medical History:  Diagnosis Date   Biventricular failure (HCC)    CVA (cerebral vascular accident) (HCC)    Polysubstance use disorder    Schizophrenia (HCC)    Current Facility-Administered Medications  Medication Dose Route Frequency Provider Last Rate Last Admin   acetaminophen  (TYLENOL ) tablet 650 mg  650 mg Oral Q6H PRN Hampton, Tracie B, NP   650 mg at 08/14/24 0239   alum & mag hydroxide-simeth (MAALOX/MYLANTA) 200-200-20 MG/5ML suspension 30 mL  30 mL Oral Q4H PRN Hampton, Tracie B, NP   30 mL at 08/05/24 0306   amiodarone  (PACERONE ) tablet 400 mg  400 mg Oral Daily [name], [name], MD   400 mg at 08/18/24 9096   ARIPiprazole  (ABILIFY ) tablet 15 mg  15 mg Oral Daily [name], [name], MD   15 mg at 08/19/24 0857   ARIPiprazole  ER (ABILIFY  MAINTENA) injection 400 mg  400 mg Intramuscular Q28 days [name], [name], MD   400 mg at 07/27/24 9070   divalproex  (DEPAKOTE  ER) 24 hr tablet 1,000 mg  1,000 mg Oral QHS [name], [name], MD   1,000 mg at 08/19/24 2051   docusate sodium  (COLACE) capsule 100 mg  100 mg Oral Daily [name], [name], MD   100 mg at 08/19/24 0858   feeding supplement (ENSURE PLUS HIGH PROTEIN) liquid 237 mL  237 mL Oral TID BM [name], [name], MD   237 mL at 08/19/24 2051   hydrOXYzine  (ATARAX ) tablet 25 mg  25 mg Oral TID PRN Hampton, Tracie B, NP   25 mg at 08/18/24 0224   magnesium  hydroxide (MILK OF MAGNESIA) suspension 30 mL  30 mL Oral Daily PRN Hampton, Tracie B, NP   30 mL at 08/08/24 2233   memantine  (NAMENDA ) tablet 10 mg  10 mg Oral Daily [name], [name], MD   10 mg at 08/19/24 0858   metoprolol  tartrate (LOPRESSOR ) tablet 25 mg  25 mg Oral BID [name], [name], MD   25 mg at 08/19/24 2139   naproxen  (NAPROSYN ) tablet 250 mg  250 mg Oral BID WC [name], [name], MD   250 mg at 08/19/24 1715   OLANZapine  (ZYPREXA ) injection 5 mg  5 mg Intramuscular TID PRN Hampton, Tracie B, NP       OLANZapine   zydis (ZYPREXA ) disintegrating tablet 5 mg  5 mg Oral TID PRN Hampton, Tracie B, NP   5 mg at 08/04/24 1129   oxyCODONE  (Oxy IR/ROXICODONE ) immediate release tablet 5 mg  5 mg Oral Q4H PRN Dorinda Homans T, MD   5 mg at 08/12/24 0451   pantoprazole  (PROTONIX ) EC tablet 40 mg  40 mg Oral Daily [name], [name], MD   40 mg at 08/18/24 9096   sodium chloride  (OCEAN) 0.65 % nasal spray 1 spray  1 spray Each Nare PRN [name], [name], MD   1 spray at 08/18/24 1459   traZODone  (DESYREL ) tablet 50 mg  50 mg Oral QHS PRN Hampton, Tracie B, NP   50 mg at 08/17/24 2113   Allergies[1] Active Problems:   Schizophrenia, paranoid (HCC)   Dementia with behavioral disturbance (HCC)  Blood pressure 134/70, pulse 78, temperature 97.7 F (36.5 C), resp. rate 14, height 6' 1 (1.854 m), weight 81.2 kg, SpO2 99%.   [place] Yearby B Ambera Fedele 08/20/2024      [1] No Known Allergies

## 2024-08-20 NOTE — Plan of Care (Signed)
  Problem: Education: Goal: Knowledge of [place] [facility] Education information/materials will improve Outcome: Progressing   Problem: Activity: Goal: Interest or engagement in activities will improve Outcome: Progressing   Problem: Coping: Goal: Ability to verbalize frustrations and anger appropriately will improve Outcome: Progressing   Problem: Education: Goal: Knowledge of  General Education information/materials will improve Outcome: Progressing Goal: Emotional status will improve Outcome: Progressing Goal: Mental status will improve Outcome: Progressing Goal: Verbalization of understanding the information provided will improve Outcome: Progressing   Problem: Activity: Goal: Interest or engagement in activities will improve Outcome: Progressing Goal: Sleeping patterns will improve Outcome: Progressing   Problem: Coping: Goal: Ability to verbalize frustrations and anger appropriately will improve Outcome: Progressing Goal: Ability to demonstrate self-control will improve Outcome: Progressing   Problem: Health Behavior/Discharge Planning: Goal: Identification of resources available to assist in meeting health care needs will improve Outcome: Progressing Goal: Compliance with treatment plan for underlying cause of condition will improve Outcome: Progressing   Problem: Physical Regulation: Goal: Ability to maintain clinical measurements within normal limits will improve Outcome: Progressing   Problem: Safety: Goal: Periods of time without injury will increase Outcome: Progressing   Problem: Coping: Goal: Ability to adjust to condition or change in health will improve Outcome: Progressing   Problem: Health Behavior/Discharge Planning: Goal: Ability to identify and utilize available resources and services will improve Outcome: Progressing

## 2024-08-20 NOTE — Progress Notes (Signed)
 [facility] MD Progress Note  08/20/2024 12:22 PM Miguel [name]  MRN:  [id number]  Miguel [name] is a 73 year old male with a past psychiatric history significant for schizophrenia who presents under involuntary commitment initiated by family due to medication non-adherence, increasing paranoia, and behavioral disorganization. 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 Brother reports multiple episodes of patient whispering through the night, stating people were talking about him and planning to harm him. Patient was advised by his ACT Team to come to the ED; he refused, prompting the family to pursue IVC.Patient is admitted to [facility] unit with Q15 min safety monitoring. Multidisciplinary team approach is offered. Medication management; group/milieu therapy is offered.  Subjective:  Chart reviewed, case discussed in multidisciplinary meeting, patient seen during rounds.   Patient is noted to be walking in the hallway.  He talked to the provider in his room.  He reports that he is cleaning up his room.  He denies being in pain today.  He reports fair appetite and sleep.  He remains confused and refers to somebody stating writer's husband came to see him and patient told the provider to manage her emotions.  Patient was unable to give any details but what he is referring to.  He responds linearly to the questions specific to SI/HI hallucinations and denies having any behaviors Past Psychiatric History: see h&P Family History:  Family History  Family history unknown: Yes   Social  History:  Social History   Substance and Sexual Activity  Alcohol Use No     Social History   Substance and Sexual Activity  Drug Use No    Social History   Socioeconomic History   Marital status: Divorced    Spouse name: Not on file   Number of children: Not on file   Years of education: Not on file   Highest education level: Not on file  Occupational History   Not on file  Tobacco Use   Smoking status: Every Day    Current packs/day: 0.50    Average packs/day: 0.5 packs/day for 46.0 years (23.0 ttl pk-yrs)    Types: Cigarettes    Start date: 38   Smokeless tobacco: Never  Vaping Use   Vaping status: Never Used  Substance and Sexual Activity   Alcohol use: No   Drug use: No   Sexual activity: Not on file  Other Topics Concern   Not on file  Social History Narrative   Not on file   Social Drivers of Health   Tobacco Use: High Risk (07/18/2024)   Patient History    Smoking Tobacco Use: Every Day    Smokeless Tobacco Use: Never    Passive Exposure: Not on file  Financial Resource Strain: Not on file  Food Insecurity: Patient Unable To Answer (07/18/2024)   Epic    Worried About Programme Researcher, Broadcasting/film/video in the Last Year: Patient unable to answer    Ran Out of Food in the Last Year: Patient unable to answer  Transportation Needs: Unmet Transportation Needs (07/18/2024)   Epic    Lack of Transportation (Medical): Yes    Lack of Transportation (Non-Medical): Yes  Physical Activity: Not on file  Stress: Not on file  Social Connections: Patient Unable To Answer (07/18/2024)   Social Connection and Isolation Panel    Frequency of Communication with Friends and Family: Patient unable to answer    Frequency of Social Gatherings with Friends and Family: Patient unable to answer    Attends Religious Services: Patient unable to answer    Active Member of Clubs or Organizations: Patient unable to answer    Attends Banker Meetings: Patient unable to answer     Marital Status: Patient unable to answer  Depression (PHQ2-9): Not on file  Alcohol Screen: Low Risk (07/18/2024)   Alcohol Screen    Last Alcohol Screening Score (AUDIT): 0  Housing: Unknown (07/18/2024)   Epic    Unable to Pay for Housing in the Last Year: Patient unable to answer    Number of Times Moved in the Last Year: 0    Homeless in the Last Year: Patient declined  Utilities: Patient Unable To Answer (07/18/2024)   Epic    Threatened with loss of utilities: Patient unable to answer  Health Literacy: Not on file   Past Medical History:  Past Medical History:  Diagnosis Date   Biventricular failure (HCC)    CVA (cerebral vascular accident) (HCC)    Polysubstance use disorder    Schizophrenia (HCC)     Past Surgical History:  Procedure Laterality Date   APPENDECTOMY     TRACHEOSTOMY TUBE PLACEMENT N/A 03/04/2024   Procedure: CREATION, TRACHEOSTOMY;  Surgeon: [name] Massie RAMAN, MD;  Location: [facility] ORS;  Service: ENT;  Laterality: N/A;    Current Medications: Current Facility-Administered Medications  Medication Dose Route Frequency Provider Last Rate Last Admin   acetaminophen  (TYLENOL ) tablet 650 mg  650 mg Oral Q6H PRN Hampton, Tracie B, NP   650 mg at 08/14/24 0239   alum & mag hydroxide-simeth (MAALOX/MYLANTA) 200-200-20 MG/5ML suspension 30 mL  30 mL Oral Q4H PRN Hampton, Tracie B, NP   30 mL at 08/05/24 0306   amiodarone  (PACERONE ) tablet 400 mg  400 mg Oral Daily [name], [name], MD   400 mg at 08/18/24 9096   ARIPiprazole  (ABILIFY ) tablet 15 mg  15 mg Oral Daily [name], [name], MD   15 mg at 08/19/24 0857   ARIPiprazole  ER (ABILIFY  MAINTENA) injection 400 mg  400 mg Intramuscular Q28 days Skyy Nilan, MD   400 mg at 07/27/24 9070   divalproex  (DEPAKOTE  ER) 24 hr tablet 1,000 mg  1,000 mg Oral QHS Shantelle Alles, MD   1,000 mg at 08/19/24 2051   docusate sodium  (COLACE) capsule 100 mg  100 mg Oral Daily Nathon Stefanski, MD   100 mg at 08/19/24 0858   feeding  supplement (ENSURE PLUS HIGH PROTEIN) liquid 237 mL  237 mL Oral TID BM [name], [name], MD   237 mL at 08/20/24 0801   hydrOXYzine  (ATARAX ) tablet 25 mg  25 mg Oral TID PRN Hampton, Tracie B, NP   25 mg at 08/18/24 9775   magnesium  hydroxide (MILK OF MAGNESIA) suspension 30 mL  30 mL Oral Daily PRN Hampton, Tracie B, NP   30 mL at 08/08/24 2233   memantine  (NAMENDA ) tablet 10 mg  10 mg Oral Daily Raylen Tangonan, MD   10 mg at 08/19/24 0858   metoprolol  tartrate (LOPRESSOR ) tablet 25 mg  25 mg Oral BID [name], [name], MD   25 mg at 08/19/24 2139   naproxen  (NAPROSYN ) tablet 250 mg  250 mg Oral BID WC  Darion Milewski, MD   250 mg at 08/19/24 1715   OLANZapine  (ZYPREXA ) injection 5 mg  5 mg Intramuscular TID PRN Hampton, Tracie B, NP       OLANZapine  zydis (ZYPREXA ) disintegrating tablet 5 mg  5 mg Oral TID PRN Hampton, Tracie B, NP   5 mg at 08/04/24 1129   oxyCODONE  (Oxy IR/ROXICODONE ) immediate release tablet 5 mg  5 mg Oral Q4H PRN Dorinda Homans T, MD   5 mg at 08/12/24 0451   pantoprazole  (PROTONIX ) EC tablet 40 mg  40 mg Oral Daily [name], [name], MD   40 mg at 08/18/24 9096   sodium chloride  (OCEAN) 0.65 % nasal spray 1 spray  1 spray Each Nare PRN Donnelly Mellow, MD   1 spray at 08/18/24 1459   traZODone  (DESYREL ) tablet 50 mg  50 mg Oral QHS PRN Hampton, Tracie B, NP   50 mg at 08/17/24 2113    Lab Results:  No results found for this or any previous visit (from the past 48 hours).    Blood Alcohol level:  Lab Results  Component Value Date   [facility] <15 07/17/2024   ETH <15 02/20/2024    Metabolic Disorder Labs: Lab Results  Component Value Date   HGBA1C 5.4 08/12/2024   MPG 108.28 08/12/2024   MPG 119.76 07/14/2018   No results found for: PROLACTIN Lab Results  Component Value Date   CHOL 139 08/12/2024   TRIG 73 08/12/2024   HDL 52 08/12/2024   CHOLHDL 2.7 08/12/2024   VLDL 15 08/12/2024   LDLCALC 72 08/12/2024   LDLCALC 119 (H) 07/13/2018    Physical  Findings: AIMS:  , ,  ,  ,    CIWA:    COWS:      Psychiatric Specialty Exam:  Presentation  General Appearance: Appropriate for Environment  Eye Contact:Fleeting  Speech:Normal Rate  Speech Volume:Decreased    Mood and Affect  Mood:Anxious  Affect:Flat   Thought Process  Thought Processes:circumstantial Orientation:Partial  Thought Content:discharge focused  Hallucinations:denies  Ideas of Reference:Delusions At baseline Suicidal Thoughts:denies  Homicidal Thoughts:denies   Sensorium  Memory:Immediate Poor; Recent Poor; Remote Poor  Judgment:Impaired  Insight:None   Executive Functions  Concentration:Poor  Attention Span:Poor  Recall:Poor  Fund of Knowledge:Poor  Language:Fair   Psychomotor Activity  Psychomotor Activity:No data recorded  Musculoskeletal: Strength & Muscle Tone: within normal limits Gait & Station: normal Assets  Assets:Communication Skills; Desire for Improvement; Social Support    Physical Exam: Physical Exam Vitals and nursing note reviewed.    ROS Blood pressure 112/64, pulse 67, temperature 98.1 F (36.7 C), resp. rate 14, height 6' 1 (1.854 m), weight 81.2 kg, SpO2 96%. Body mass index is 23.62 kg/m.  Diagnosis: Active Problems:   Schizophrenia, paranoid (HCC)   Dementia with behavioral disturbance (HCC)   PLAN: Safety and Monitoring:  -- Involuntary admission to inpatient psychiatric unit for safety, stabilization and treatment  -- Daily contact with patient to assess and evaluate symptoms and progress in treatment  -- Patient's case to be discussed in multi-disciplinary team meeting  -- Observation Level : q15 minute checks  -- Vital signs:  q12 hours  -- Precautions: suicide, elopement, and assault -- Encouraged patient to participate in unit milieu and in scheduled group therapies  2. Psychiatric Treatment:  Scheduled Medications:   Abilify  10 mg due to worsening agitation and paranoia even  after LAI -Abilify  10 mg daily LAY Abilify  Maintena 400 mg q4 weeks given on 07/27/24  Cogentin  1mg  one dose given for EPS Depakote  ER increased to 1000 mg to help with mood stabilization as he was receiving multiple as needed antipsychotics for agitation  -- The risks/benefits/side-effects/alternatives to this medication were discussed in detail with the patient and time was given for questions. The patient consents to medication trial.  3. Medical Issues Being Addressed:  [facility] consulted for wound care management   Assessment and Plan:   Sacral Decubitus Ulcer Full thickness decubitus ulcer in setting of recent extended hospitalization, discharged from [facility] 14[facility] Wound looks pretty good overall WOC on board we appreciate input   Cognitive Deficit Concern for anoxic brain injury at discharge 02/2024 Delirium precautions   Peripheral Vascular Disease Legs with features c/w peripheral vascular disease, diminished DP pulses Would benefit from outpatient ABI's   HFrEF Euvolemic at this time Continue home medications   Hx Atrial Flutter Not on any anticoagulation at this time EKG with NSR Continue amiodarone   Hypertension Noted, adjust meds gradually after med rec complete     4. Discharge Planning:   -- Social work and case management to assist with discharge planning and identification of hospital follow-up needs prior to discharge  -- Estimated LOS: 3-4 days  Allyn Foil, MD 08/20/2024, 12:22 PM

## 2024-08-20 NOTE — Progress Notes (Signed)
 Pt asked RN what meds she was going to give.  Pt does not believe he has a hx of cardiac issues and pt refused all meds.  This RN is not a familiar face to pt so the other RN agreed to try and see if pt will take meds for her.  Pt did agree that he wanted Ensure.  It was provided to pt.

## 2024-08-20 NOTE — Group Note (Signed)
 Date:  08/20/2024 Time:  3:43 PM  Group Topic/Focus:  Self Care:   The focus of this group is to help patients understand the importance of self-care in order to improve or restore emotional, physical, spiritual, interpersonal, and financial health.  Meditation offers significant benefits for geriatric patients, including boosting cognitive health (memory, focus), improving emotional well-being (reducing stress, anxiety, depression, loneliness), managing chronic pain, lowering blood pressure, strengthening the immune system, and enhancing physical balance and flexibility through gentle movements like Tai Chi, all while supporting brain plasticity and overall quality of life as they age.   Participation Level:  Active  Participation Quality:  Appropriate  Affect:  Appropriate  Cognitive:  Appropriate  Insight: Appropriate  Engagement in Group:  Engaged  Modes of Intervention:  Activity  Additional Comments:  N/A  Miguel [name] Gavel 08/20/2024, 3:43 PM

## 2024-08-20 NOTE — Group Note (Signed)
 Date:  08/20/2024 Time:  8:33 PM  Group Topic/Focus:  Goals Group:   The focus of this group is to help patients establish daily goals to achieve during treatment and discuss how the patient can incorporate goal setting into their daily lives to aide in recovery. Personal Choices and Values:   The focus of this group is to help patients assess and explore the importance of values in their lives, how their values affect their decisions, how they express their values and what opposes their expression. Spirituality:   The focus of this group is to discuss how one's spirituality can aide in recovery.    Participation Level:  Active  Participation Quality:  Appropriate  Affect:  Appropriate  Cognitive:  Appropriate  Insight: Appropriate  Engagement in Group:  Improving  Modes of Intervention:  Discussion  Additional Comments:  Pt attended group  Alyssamarie Mounsey E Denaya Horn 08/20/2024, 8:33 PM

## 2024-08-20 NOTE — Group Note (Signed)
 Date:  08/20/2024 Time:  10:36 AM  Group Topic/Focus:  Christmas Movie     Participation Level:  Did Not Attend    Miguel [name] 08/20/2024, 10:36 AM

## 2024-08-21 DIAGNOSIS — F03918 Unspecified dementia, unspecified severity, with other behavioral disturbance: Secondary | ICD-10-CM | POA: Diagnosis not present

## 2024-08-21 DIAGNOSIS — F200 Paranoid schizophrenia: Secondary | ICD-10-CM | POA: Diagnosis not present

## 2024-08-21 NOTE — Group Note (Signed)
 Date:  08/21/2024 Time:  3:46 PM  Group Topic/Focus:  Meditation Therapy    Participation Level:  Did Not Attend    Miguel [name] Bias 08/21/2024, 3:46 PM

## 2024-08-21 NOTE — Group Note (Signed)
 Date:  08/21/2024 Time:  10:38 AM  Group Topic/Focus:  Movement Therapy, Morning Stretch with Eiza Canniff.    Participation Level:  Did Not Attend    Miguel [name] 08/21/2024, 10:38 AM

## 2024-08-21 NOTE — Progress Notes (Signed)
 [facility] MD Progress Note  08/21/2024 12:18 PM Miguel [name]  MRN:  [id number]  Miguel [name] is a 73 year old male with a past psychiatric history significant for schizophrenia who presents under involuntary commitment initiated by family due to medication non-adherence, increasing paranoia, and behavioral disorganization. 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 Brother reports multiple episodes of patient whispering through the night, stating people were talking about him and planning to harm him. Patient was advised by his ACT Team to come to the ED; he refused, prompting the family to pursue IVC.Patient is admitted to [facility] unit with Q15 min safety monitoring. Multidisciplinary team approach is offered. Medication management; group/milieu therapy is offered.  Subjective:  Chart reviewed, case discussed in multidisciplinary meeting, patient seen during rounds.  Patient is noted to be resting in his room with no shirt on.  He denies having any physical complaints at denies any side effects to medications including EPS.  Per nursing patient is maintaining his hydration and nutrition.  He remains confused about refusing his medications and then coming back to nurses station asking for medications and when they pull the medications out of Pyxis he again goes on to refuse them.  He has been taking his medications intermittently.  He is not responding to internal stimuli and denies SI/HI/plan  Past Psychiatric History: see h&P Family History:  Family History  Family history  unknown: Yes   Social History:  Social History   Substance and Sexual Activity  Alcohol Use No     Social History   Substance and Sexual Activity  Drug Use No    Social History   Socioeconomic History   Marital status: Divorced    Spouse name: Not on file   Number of children: Not on file   Years of education: Not on file   Highest education level: Not on file  Occupational History   Not on file  Tobacco Use   Smoking status: Every Day    Current packs/day: 0.50    Average packs/day: 0.5 packs/day for 46.0 years (23.0 ttl pk-yrs)    Types: Cigarettes    Start date: 38   Smokeless tobacco: Never  Vaping Use   Vaping status: Never Used  Substance and Sexual Activity   Alcohol use: No   Drug use: No   Sexual activity: Not on file  Other Topics Concern   Not on file  Social History Narrative   Not on file   Social Drivers of Health   Tobacco Use: High Risk (07/18/2024)   Patient History    Smoking Tobacco Use: Every Day    Smokeless Tobacco Use: Never    Passive Exposure: Not on file  Financial Resource Strain: Not on file  Food Insecurity: Patient Unable To Answer (07/18/2024)   Epic    Worried About Programme Researcher, Broadcasting/film/video in the Last Year: Patient unable to answer    Ran Out of Food in the Last Year: Patient unable to answer  Transportation Needs: Unmet Transportation Needs (07/18/2024)   Epic    Lack of Transportation (Medical): Yes    Lack of Transportation (Non-Medical): Yes  Physical Activity: Not on file  Stress: Not on file  Social Connections: Patient Unable To Answer (07/18/2024)   Social Connection and Isolation Panel    Frequency of Communication with Friends and Family: Patient unable to answer    Frequency of Social Gatherings with Friends and Family: Patient unable to answer    Attends Religious Services: Patient unable to answer    Active Member of Clubs or Organizations: Patient unable to answer    Attends Banker Meetings:  Patient unable to answer    Marital Status: Patient unable to answer  Depression (PHQ2-9): Not on file  Alcohol Screen: Low Risk (07/18/2024)   Alcohol Screen    Last Alcohol Screening Score (AUDIT): 0  Housing: Unknown (07/18/2024)   Epic    Unable to Pay for Housing in the Last Year: Patient unable to answer    Number of Times Moved in the Last Year: 0    Homeless in the Last Year: Patient declined  Utilities: Patient Unable To Answer (07/18/2024)   Epic    Threatened with loss of utilities: Patient unable to answer  Health Literacy: Not on file   Past Medical History:  Past Medical History:  Diagnosis Date   Biventricular failure (HCC)    CVA (cerebral vascular accident) (HCC)    Polysubstance use disorder    Schizophrenia (HCC)     Past Surgical History:  Procedure Laterality Date   APPENDECTOMY     TRACHEOSTOMY TUBE PLACEMENT N/A 03/04/2024   Procedure: CREATION, TRACHEOSTOMY;  Surgeon: [name] Massie RAMAN, MD;  Location: [facility] ORS;  Service: ENT;  Laterality: N/A;    Current Medications: Current Facility-Administered Medications  Medication Dose Route Frequency Provider Last Rate Last Admin   acetaminophen  (TYLENOL ) tablet 650 mg  650 mg Oral Q6H PRN Hampton, Tracie B, NP   650 mg at 08/14/24 0239   alum & mag hydroxide-simeth (MAALOX/MYLANTA) 200-200-20 MG/5ML suspension 30 mL  30 mL Oral Q4H PRN Hampton, Tracie B, NP   30 mL at 08/05/24 0306   amiodarone  (PACERONE ) tablet 400 mg  400 mg Oral Daily [name], [name], MD   400 mg at 08/21/24 9162   ARIPiprazole  (ABILIFY ) tablet 15 mg  15 mg Oral Daily [name], [name], MD   15 mg at 08/21/24 9161   ARIPiprazole  ER (ABILIFY  MAINTENA) injection 400 mg  400 mg Intramuscular Q28 days Katanya Schlie, MD   400 mg at 07/27/24 0929   divalproex  (DEPAKOTE  ER) 24 hr tablet 1,000 mg  1,000 mg Oral QHS Ameen Mostafa, MD   1,000 mg at 08/20/24 2123   docusate sodium  (COLACE) capsule 100 mg  100 mg Oral Daily Aalivia Mcgraw, MD   100 mg at  08/21/24 0837   feeding supplement (ENSURE PLUS HIGH PROTEIN) liquid 237 mL  237 mL Oral TID BM [name], [name], MD   237 mL at 08/20/24 2123   hydrOXYzine  (ATARAX ) tablet 25 mg  25 mg Oral TID PRN Hampton, Tracie B, NP   25 mg at 08/18/24 0224   magnesium  hydroxide (MILK OF MAGNESIA) suspension 30 mL  30 mL Oral Daily PRN Hampton, Tracie B, NP   30 mL at 08/08/24 2233   memantine  (NAMENDA ) tablet 10 mg  10 mg Oral Daily Caid Radin, MD   10 mg at 08/21/24 9161   metoprolol  tartrate (LOPRESSOR ) tablet 25 mg  25 mg Oral BID [name], [name], MD   25 mg at 08/20/24 2123   naproxen  (NAPROSYN ) tablet 250 mg  250 mg Oral BID WC Aalia Greulich, MD  250 mg at 08/21/24 9162   OLANZapine  (ZYPREXA ) injection 5 mg  5 mg Intramuscular TID PRN Hampton, Tracie B, NP       OLANZapine  zydis (ZYPREXA ) disintegrating tablet 5 mg  5 mg Oral TID PRN Hampton, Tracie B, NP   5 mg at 08/04/24 1129   oxyCODONE  (Oxy IR/ROXICODONE ) immediate release tablet 5 mg  5 mg Oral Q4H PRN Dorinda Homans T, MD   5 mg at 08/12/24 0451   pantoprazole  (PROTONIX ) EC tablet 40 mg  40 mg Oral Daily [name], [name], MD   40 mg at 08/21/24 9161   sodium chloride  (OCEAN) 0.65 % nasal spray 1 spray  1 spray Each Nare PRN Donnelly Mellow, MD   1 spray at 08/18/24 1459   traZODone  (DESYREL ) tablet 50 mg  50 mg Oral QHS PRN Hampton, Tracie B, NP   50 mg at 08/20/24 2123    Lab Results:  No results found for this or any previous visit (from the past 48 hours).    Blood Alcohol level:  Lab Results  Component Value Date   [facility] <15 07/17/2024   ETH <15 02/20/2024    Metabolic Disorder Labs: Lab Results  Component Value Date   HGBA1C 5.4 08/12/2024   MPG 108.28 08/12/2024   MPG 119.76 07/14/2018   No results found for: PROLACTIN Lab Results  Component Value Date   CHOL 139 08/12/2024   TRIG 73 08/12/2024   HDL 52 08/12/2024   CHOLHDL 2.7 08/12/2024   VLDL 15 08/12/2024   LDLCALC 72 08/12/2024   LDLCALC 119 (H) 07/13/2018     Physical Findings: AIMS:  , ,  ,  ,    CIWA:    COWS:      Psychiatric Specialty Exam:  Presentation  General Appearance: Appropriate for Environment  Eye Contact:Fleeting  Speech:Normal Rate  Speech Volume:Decreased    Mood and Affect  Mood:Anxious  Affect:Flat   Thought Process  Thought Processes:circumstantial Orientation:Partial  Thought Content:discharge focused  Hallucinations:denies  Ideas of Reference:Delusions At baseline Suicidal Thoughts:denies  Homicidal Thoughts:denies   Sensorium  Memory:Immediate Poor; Recent Poor; Remote Poor  Judgment:Impaired  Insight:None   Executive Functions  Concentration:Poor  Attention Span:Poor  Recall:Poor  Fund of Knowledge:Poor  Language:Fair   Psychomotor Activity  Psychomotor Activity:No data recorded  Musculoskeletal: Strength & Muscle Tone: within normal limits Gait & Station: normal Assets  Assets:Communication Skills; Desire for Improvement; Social Support    Physical Exam: Physical Exam Vitals and nursing note reviewed.    ROS Blood pressure 100/64, pulse 79, temperature 98.1 F (36.7 C), resp. rate 14, height 6' 1 (1.854 m), weight 81.2 kg, SpO2 93%. Body mass index is 23.62 kg/m.  Diagnosis: Active Problems:   Schizophrenia, paranoid (HCC)   Dementia with behavioral disturbance (HCC)   PLAN: Safety and Monitoring:  -- Involuntary admission to inpatient psychiatric unit for safety, stabilization and treatment  -- Daily contact with patient to assess and evaluate symptoms and progress in treatment  -- Patient's case to be discussed in multi-disciplinary team meeting  -- Observation Level : q15 minute checks  -- Vital signs:  q12 hours  -- Precautions: suicide, elopement, and assault -- Encouraged patient to participate in unit milieu and in scheduled group therapies  2. Psychiatric Treatment:  Scheduled Medications:   Abilify  10 mg due to worsening agitation and  paranoia even after LAI -Abilify  10 mg daily LAY Abilify  Maintena 400 mg q4 weeks given on 07/27/24  Cogentin  1mg  one dose given  for EPS Depakote  ER increased to 1000 mg to help with mood stabilization as he was receiving multiple as needed antipsychotics for agitation  -- The risks/benefits/side-effects/alternatives to this medication were discussed in detail with the patient and time was given for questions. The patient consents to medication trial.  3. Medical Issues Being Addressed:  [facility] consulted for wound care management   Assessment and Plan:   Sacral Decubitus Ulcer Full thickness decubitus ulcer in setting of recent extended hospitalization, discharged from [facility] Nov 14th Wound looks pretty good overall WOC on board we appreciate input   Cognitive Deficit Concern for anoxic brain injury at discharge 02/2024 Delirium precautions   Peripheral Vascular Disease Legs with features c/w peripheral vascular disease, diminished DP pulses Would benefit from outpatient ABI's   HFrEF Euvolemic at this time Continue home medications   Hx Atrial Flutter Not on any anticoagulation at this time EKG with NSR Continue amiodarone   Hypertension Noted, adjust meds gradually after med rec complete     4. Discharge Planning:   -- Social work and case management to assist with discharge planning and identification of hospital follow-up needs prior to discharge  -- Estimated LOS: 3-4 days  Allyn Foil, MD 08/21/2024, 12:18 PM

## 2024-08-21 NOTE — Group Note (Signed)
 Date:  08/21/2024 Time:  9:30 PM  Group Topic/Focus:  Wrap-Up Group:   The focus of this group is to help patients review their daily goal of treatment and discuss progress on daily workbooks.    Participation Level:  Active  Participation Quality:  Appropriate  Affect:  Appropriate  Cognitive:  Alert  Insight: Appropriate  Engagement in Group:  Engaged  Modes of Intervention:  Discussion  Additional Comments:    Miguel [name] 08/21/2024, 9:30 PM

## 2024-08-21 NOTE — Group Note (Deleted)
 Date:  08/21/2024 Time:  8:47 PM  Group Topic/Focus:  Overcoming Stress:   The focus of this group is to define stress and help patients assess their triggers.     Participation Level:  {BHH PARTICIPATION OZCZO:77735}  Participation Quality:  {BHH PARTICIPATION QUALITY:22265}  Affect:  {BHH AFFECT:22266}  Cognitive:  {BHH COGNITIVE:22267}  Insight: {BHH Insight2:20797}  Engagement in Group:  {BHH ENGAGEMENT IN HMNLE:77731}  Modes of Intervention:  {BHH MODES OF INTERVENTION:22269}  Additional Comments:  [name] [name] [name] [name] 08/21/2024, 8:47 PM

## 2024-08-21 NOTE — BH IP Treatment Plan (Signed)
 Interdisciplinary Treatment and Diagnostic Plan Update  08/21/2024 Time of Session: 2:45PM Miguel [name] MRN: [id number]  Principal Diagnosis: <principal problem not specified>  Secondary Diagnoses: Active Problems:   Schizophrenia, paranoid (HCC)   Dementia with behavioral disturbance (HCC)   Current Medications:  Current Facility-Administered Medications  Medication Dose Route Frequency Provider Last Rate Last Admin   acetaminophen  (TYLENOL ) tablet 650 mg  650 mg Oral Q6H PRN Hampton, Tracie B, NP   650 mg at 08/14/24 0239   alum & mag hydroxide-simeth (MAALOX/MYLANTA) 200-200-20 MG/5ML suspension 30 mL  30 mL Oral Q4H PRN Hampton, Tracie B, NP   30 mL at 08/05/24 0306   amiodarone  (PACERONE ) tablet 400 mg  400 mg Oral Daily [name], [name], MD   400 mg at 08/21/24 9162   ARIPiprazole  (ABILIFY ) tablet 15 mg  15 mg Oral Daily [name], [name], MD   15 mg at 08/21/24 9161   ARIPiprazole  ER (ABILIFY  MAINTENA) injection 400 mg  400 mg Intramuscular Q28 days [name], [name], MD   400 mg at 07/27/24 9070   divalproex  (DEPAKOTE  ER) 24 hr tablet 1,000 mg  1,000 mg Oral QHS [name], [name], MD   1,000 mg at 08/20/24 2123   docusate sodium  (COLACE) capsule 100 mg  100 mg Oral Daily [name], [name], MD   100 mg at 08/21/24 9162   feeding supplement (ENSURE PLUS HIGH PROTEIN) liquid 237 mL  237 mL Oral TID BM [name], [name], MD   237 mL at 08/20/24 2123   hydrOXYzine  (ATARAX ) tablet 25 mg  25 mg Oral TID PRN Hampton, Tracie B, NP   25 mg at 08/18/24 0224   magnesium  hydroxide (MILK OF MAGNESIA) suspension 30 mL  30 mL Oral Daily PRN Hampton, Tracie B, NP   30 mL at 08/08/24 2233   memantine  (NAMENDA ) tablet 10 mg  10 mg Oral Daily [name], [name], MD   10 mg at 08/21/24 9161   metoprolol  tartrate (LOPRESSOR ) tablet 25 mg  25 mg Oral BID [name], [name], MD   25 mg at 08/20/24 2123   naproxen  (NAPROSYN ) tablet 250 mg  250 mg Oral BID WC [name], [name], MD   250 mg at 08/21/24 9162    OLANZapine  (ZYPREXA ) injection 5 mg  5 mg Intramuscular TID PRN Hampton, Tracie B, NP       OLANZapine  zydis (ZYPREXA ) disintegrating tablet 5 mg  5 mg Oral TID PRN Hampton, Tracie B, NP   5 mg at 08/04/24 1129   oxyCODONE  (Oxy IR/ROXICODONE ) immediate release tablet 5 mg  5 mg Oral Q4H PRN [name], [name], MD   5 mg at 08/12/24 0451   pantoprazole  (PROTONIX ) EC tablet 40 mg  40 mg Oral Daily [name], [name], MD   40 mg at 08/21/24 9161   sodium chloride  (OCEAN) 0.65 % nasal spray 1 spray  1 spray Each Nare PRN Donnelly Mellow, MD   1 spray at 08/18/24 1459   traZODone  (DESYREL ) tablet 50 mg  50 mg Oral QHS PRN Hampton, Tracie B, NP   50 mg at 08/20/24 2123   PTA Medications: Medications Prior to Admission  Medication Sig Dispense Refill Last Dose/Taking   amiodarone  (PACERONE ) 400 MG tablet Place 1 tablet (400 mg total) into feeding tube daily.      ARIPiprazole  (ABILIFY ) 30 MG tablet Place 1 tablet (30 mg total) into feeding tube at bedtime.      bisacodyl  (DULCOLAX) 10 MG suppository Place 1 suppository (10 mg total) rectally daily  as needed for moderate constipation or severe constipation.      busPIRone  (BUSPAR ) 5 MG tablet Take 5 mg by mouth 3 (three) times daily.      Chlorhexidine  Gluconate Cloth 2 % PADS Apply 6 each topically daily.      digoxin  (LANOXIN ) 0.125 MG tablet 1 tablet (0.125 mg total) by Per NG tube route daily. (Patient taking differently: Take 0.125 mg by mouth every other day.)      docusate (COLACE) 50 MG/5ML liquid Place 10 mLs (100 mg total) into feeding tube 2 (two) times daily.      metoprolol  tartrate (LOPRESSOR ) 25 MG tablet Take 25 mg by mouth 2 (two) times daily.      pantoprazole  (PROTONIX ) 40 MG tablet Take 40 mg by mouth daily.      traMADol  (ULTRAM ) 50 MG tablet Take 50 mg by mouth 2 (two) times daily.       Patient Stressors: Educational concerns   Health problems   Medication change or noncompliance   Other: Stated transitional housing concerns     Patient Strengths: Manufacturing systems engineer  Religious Affiliation   Treatment Modalities: Medication Management, Group therapy, Case management,  1 to 1 session with clinician, Psychoeducation, Recreational therapy.   Physician Treatment Plan for Primary Diagnosis: <principal problem not specified> Long Term Goal(s):     Short Term Goals:    Medication Management: Evaluate patient's response, side effects, and tolerance of medication regimen.  Therapeutic Interventions: 1 to 1 sessions, Unit Group sessions and Medication administration.  Evaluation of Outcomes: Progressing  Physician Treatment Plan for Secondary Diagnosis: Active Problems:   Schizophrenia, paranoid (HCC)   Dementia with behavioral disturbance (HCC)  Long Term Goal(s):     Short Term Goals:       Medication Management: Evaluate patient's response, side effects, and tolerance of medication regimen.  Therapeutic Interventions: 1 to 1 sessions, Unit Group sessions and Medication administration.  Evaluation of Outcomes: Progressing   RN Treatment Plan for Primary Diagnosis: <principal problem not specified> Long Term Goal(s): Knowledge of disease and therapeutic regimen to maintain health will improve  Short Term Goals: Ability to remain free from injury will improve, Ability to demonstrate self-control, Ability to participate in decision making will improve, Ability to verbalize feelings will improve, Ability to disclose and discuss suicidal ideas, Ability to identify and develop effective coping behaviors will improve, and Compliance with prescribed medications will improve  Medication Management: RN will administer medications as ordered by provider, will assess and evaluate patient's response and provide education to patient for prescribed medication. RN will report any adverse and/or side effects to prescribing provider.  Therapeutic Interventions: 1 on 1 counseling sessions, Psychoeducation, Medication  administration, Evaluate responses to treatment, Monitor vital signs and CBGs as ordered, Perform/monitor CIWA, COWS, AIMS and Fall Risk screenings as ordered, Perform wound care treatments as ordered.  Evaluation of Outcomes: Progressing   LCSW Treatment Plan for Primary Diagnosis: <principal problem not specified> Long Term Goal(s): Safe transition to appropriate next level of care at discharge, Engage patient in therapeutic group addressing interpersonal concerns.  Short Term Goals: Engage patient in aftercare planning with referrals and resources, Increase social support, Increase ability to appropriately verbalize feelings, Increase emotional regulation, Facilitate acceptance of mental health diagnosis and concerns, and Increase skills for wellness and recovery  Therapeutic Interventions: Assess for all discharge needs, 1 to 1 time with Social worker, Explore available resources and support systems, Assess for adequacy in community support network, Educate family and significant other(s)  on suicide prevention, Complete Psychosocial Assessment, Interpersonal group therapy.  Evaluation of Outcomes: Progressing   Progress in Treatment: Attending groups: No. Participating in groups: No. Taking medication as prescribed: Yes. Toleration medication: Yes. Family/Significant other contact made: Yes, individual(s) contacted:  SPE completed with the patient's family.  Patient understands diagnosis: No. Discussing patient identified problems/goals with staff: Yes. Medical problems stabilized or resolved: Yes. Denies suicidal/homicidal ideation: Yes. Issues/concerns per patient self-inventory: No. Other: none  New problem(s) identified: New problem(s) identified: No, Describe:  None Update 07/26/24: No changes at this time Update 08/01/24: No changes at this time  Update 08/06/24: No changes at this time 08/11/24 Update: No changes at this time.  Update 08/16/24: No changes at this time. Update  12/262025:  No changes at this time.   New Short Term/Long Term Goal(s):detox, elimination of symptoms of psychosis, medication management for mood stabilization; elimination of SI thoughts; development of comprehensive mental wellness/sobriety plan.  Update 07/26/24: No changes at this time Update 08/01/24: No changes at this time Update 08/06/24: No changes at this time  10/13/23 Update: No changes at this time. Update 08/16/24: No changes at this time.  Update 12/262025:  No changes at this time.    Patient Goals:  Make sure everything is settled. Update 07/26/24: No changes at this time Update 08/01/24: No changes at this time Update 08/06/24: No changes at this time Update 08/06/24: No changes at this time 08/11/24 Update: No changes at this time. Update 08/16/24: No changes at this time.  Update 12/262025:  No changes at this time.   Discharge Plan or Barriers: CSW to assist with the development of appropriate discharge plan.  Update 07/26/24: No changes at this time Update 08/01/24: APS Report made for pt, screened it. Update 08/06/24: FL2 sent to [facility]. Pt is not placement. [facility] and APS to assist with placement. 08/11/24 Update: CSW to continue to support [facility] in working toward placement for patient. Update 08/16/24: No changes at this time. Update 12/262025:  No changes at this time.   Reason for Continuation of Hospitalization: Aggression Anxiety Delusions  Depression Hallucinations Suicidal ideation   Estimated Length of Stay: 1-7 days. Update 07/26/24: TBD Update 08/01/24: TBD Update 08/06/24: TBD 08/11/24 Update: TBD.  Update 08/16/24: TBD  Update 12/262025:  TBD    Last 3 [place] Suicide Severity Risk Score: Flowsheet Row Admission (Current) from 07/18/2024 in [facility] ED from 07/17/2024 in [facility] Department at [facility] ED to Hosp-Admission (Discharged) from 02/20/2024 in [facility]  ICU/CCU  C-SSRS RISK CATEGORY No Risk No Risk No Risk    Last PHQ 2/9 Scores:     No data to display          Scribe for Treatment Team: Sherryle JINNY Margo, KEN 08/21/2024 3:29 PM

## 2024-08-21 NOTE — Progress Notes (Addendum)
" °   08/20/24 2100  Psych Admission Type (Psych Patients Only)  Admission Status Involuntary  Psychosocial Assessment  Patient Complaints None  Eye Contact Brief  Facial Expression Animated  Affect Appropriate to circumstance  Speech Tangential  Interaction Assertive  Motor Activity Slow  Appearance/Hygiene Body odor  Behavior Characteristics Calm;Appropriate to situation;Cooperative  Mood Pleasant  Thought Process  Coherency Tangential  Content Preoccupation  Delusions None reported or observed  Perception WDL  Hallucination None reported or observed  Judgment Impaired  Confusion Mild  Danger to Self  Current suicidal ideation? Denies  Agreement Not to Harm Self Yes  Description of Agreement verbal  Danger to Others  Danger to Others None reported or observed   Mood/Behavior:  Pleasant and cooperative. Pt disoriented to situation and up a few times at the nurses station overnight. Sound of water  running can be heard from the nurses station pt noted standing in doorway with the door half shut and peeing on the floor. Pt asked if he peed on the floor pt responded no. Floor clean and pt noted to fall back to sleep.    Psych assessment: Denies SI/HI and AVH.     Interaction / Group attendance:  Present in the milieu. Interacting with peers and staff.  Attended group.   Medication/ PRNs: Compliant with scheduled medications. Required PRNs Trazodone  for sleep and noted decrease effect.   Pain: Denies    15 min checks in place for safety. "

## 2024-08-21 NOTE — Progress Notes (Signed)
 Behavior:  Mild confusion.  Pleasant and cooperative.  Spent large amount of time in his room.    Psych assessment:  Denies SI/HI and AVH.    Interaction / Group attendance:  Minimal time in milieu.  Minimal interaction with peers and staff.    Medication/ PRNs: Compliant.  BP medication held.  Dr. JINNY made aware.   Pain:  Denies.  15 min checks in place for safety.

## 2024-08-21 NOTE — Plan of Care (Signed)
" °  Problem: Activity: Goal: Interest or engagement in activities will improve Outcome: Progressing   Problem: Activity: Goal: Interest or engagement in activities will improve Outcome: Progressing   Problem: Education: Goal: Mental status will improve Outcome: Not Progressing Goal: Verbalization of understanding the information provided will improve Outcome: Not Progressing   "

## 2024-08-21 NOTE — BHH Counselor (Signed)
 CSW contacted the patient's GAL, Lynwood Perley Louder [phone].  He reports that the patient's brother has been identified as interim guardian.  He reports that he spoken with the patient's brother and provided the brother several places that patient could discharge to.  He reports that the brother will need assistance in finding placement for the patient.   Sherryle Margo, MSW, LCSW 08/21/2024 1:37 PM

## 2024-08-22 DIAGNOSIS — F200 Paranoid schizophrenia: Secondary | ICD-10-CM | POA: Diagnosis not present

## 2024-08-22 DIAGNOSIS — F03918 Unspecified dementia, unspecified severity, with other behavioral disturbance: Secondary | ICD-10-CM | POA: Diagnosis not present

## 2024-08-22 MED ORDER — AMOXICILLIN-POT CLAVULANATE 875-125 MG PO TABS
1.0000 | ORAL_TABLET | Freq: Two times a day (BID) | ORAL | Status: AC
  Administered 2024-08-22 – 2024-08-31 (×20): 1 via ORAL
  Filled 2024-08-22 (×6): qty 1

## 2024-08-22 MED ORDER — BENZOCAINE 10 % MT GEL
Freq: Three times a day (TID) | OROMUCOSAL | Status: DC
  Administered 2024-08-22 – 2024-08-25 (×2): 1 via OROMUCOSAL
  Filled 2024-08-22: qty 9

## 2024-08-22 MED ORDER — METOPROLOL TARTRATE 25 MG PO TABS
12.5000 mg | ORAL_TABLET | Freq: Two times a day (BID) | ORAL | Status: AC
  Administered 2024-08-23 – 2024-10-02 (×55): 12.5 mg via ORAL
  Filled 2024-08-22 (×58): qty 1

## 2024-08-22 NOTE — Group Note (Signed)
 Date:  08/22/2024 Time:  10:56 AM  Group Topic/Focus:  Wellness Toolbox:   The focus of this group is to discuss various aspects of wellness, balancing those aspects and exploring ways to increase the ability to experience wellness.  Patients will create a wellness toolbox for use upon discharge.  I had the opportunity to go over the holiday and New Year activity sheets with each patient individually. The activity sheets provided patients with an opportunity to express personal memories, reflect on meaningful life experiences, and share favorite traditions, movies, and meals. Patients were also encouraged to discuss gentle New Year plans and goals, focusing on realistic and positive intentions for the upcoming year.  The activities supported cognitive engagement through word searches, memory recall, simple problem-solving, and reflection exercises. Patients were able to participate at their own pace, and assistance was provided as needed. The activity sheets also included a brief, gentle movement component, allowing patients to engage in light physical activity appropriate to their abilities.  Overall, the activity promoted emotional expression, social interaction, and mental stimulation in a calm and supportive manner. Patients appeared engaged and receptive, and the activity provided a meaningful opportunity for conversation, self-expression, and encouragement of overall well-being.  Participation Level:  Minimal  Participation Quality:  Appropriate and Attentive  Affect:  Appropriate  Cognitive:  Appropriate and Confused  Insight: Appropriate  Engagement in Group:  Engaged and Limited  Modes of Intervention:  Activity and Discussion  Additional Comments:    Daviana Haymaker L Vyom Brass 08/22/2024, 10:56 AM

## 2024-08-22 NOTE — Group Note (Signed)
 LCSW Group Therapy Note   Group Date: 08/22/2024 Start Time: 1400 End Time: 1430   Type of Therapy and Topic:  Group Therapy: Managing Intrusive Thoughts  Participation Level:  Active  Description of Group: The purpose of this group is to assist patients in learning how to regulate negative intrusive thoughts.  Intrusive thoughts are unwanted thoughts or vivid images that suddenly enter your mind.  They may be disturbing to the person experiencing them and may trigger feelings like anxiety, shame, or disgust.  Emphasis will be placed on coping with negative intrusive thoughts in situations and using positive strategies to combat negative intrusive thoughts and using positive strategies combat negative thoughts.  Therapeutic Goals:   1. Patient will identify the difference of an inconsequential thought vs an urgent thought. 2.Patient will discuss when they need to seek assistance with negative intrusive thoughts.    Summary of Patient Progress:   The facilitator and patient discussed negative intrusive thoughts and their impact on daily life.  Group members chose a negative intrusive thought and strategies for managing it .  The facilitator and patient examined how different experiences can influence their thoughts.  The patient reflected on how their thoughts, negative or positive can affect outcomes.  The patient was receptive to feedback from both peers and the facilitator and contributed to creating a supportive environment, encouraging others to open up and share.   Therapeutic Modalities:  Cognitive Behavioral Therapy Dialectical Behavior Therapy  [name] W Sarina Robleto, LCSWA 08/22/2024  4:12 PM

## 2024-08-22 NOTE — Plan of Care (Signed)
" °  Problem: Activity: Goal: Interest or engagement in activities will improve Outcome: Progressing   Problem: Activity: Goal: Interest or engagement in activities will improve Outcome: Progressing Goal: Sleeping patterns will improve Outcome: Progressing   Problem: Coping: Goal: Ability to verbalize frustrations and anger appropriately will improve Outcome: Not Progressing   Problem: Coping: Goal: Ability to verbalize frustrations and anger appropriately will improve Outcome: Not Progressing   "

## 2024-08-22 NOTE — Group Note (Signed)
 Date:  08/22/2024 Time:  4:12 PM  Group Topic/Focus:  Fresh air Therapy with Music and Conversation    Participation Level:  Active  Participation Quality:  Appropriate  Affect:  Appropriate  Cognitive:  Appropriate  Insight: Appropriate  Engagement in Group:  Engaged  Modes of Intervention:  Socialization  Additional Comments:  none  [name] 08/22/2024, 4:12 PM

## 2024-08-22 NOTE — Progress Notes (Addendum)
 [facility] MD Progress Note  08/22/2024 5:56 PM ED [facility]  MRN:  [id number]  Miguel [name]. Loberg is a 73 year old male with a past psychiatric history significant for schizophrenia who presents under involuntary commitment initiated by family due to medication non-adherence, increasing paranoia, and behavioral disorganization. 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 Brother reports multiple episodes of patient whispering through the night, stating people were talking about him and planning to harm him. Patient was advised by his ACT Team to come to the ED; he refused, prompting the family to pursue IVC.Patient is admitted to [facility] unit with Q15 min safety monitoring. Multidisciplinary team approach is offered. Medication management; group/milieu therapy is offered.  Subjective:  Chart reviewed, case discussed in multidisciplinary meeting, patient seen during rounds.  Per nursing report, the patient is often forgetful and repeatedly asks for medications even after they have already been administered. Otherwise, nursing reports the patient is doing well. The patient reports jaw pain. Nursing staff note a stage III decubitus ulcer, which appears to be healing.  On interview, the patient reports that he is doing well overall. He denies current auditory or visual hallucinations . He spoke at length about his brother, who previously served as his legal guardian but no longer holds that role, and repeatedly emphasized the bond between them. He described a long line of lies involving his  brother and expressed ongoing distress related to this relationship, stating that he sometimes feels the need to act up.  The patient reported receiving or having special messages but continued to deny auditory or visual hallucinations. He endorsed a belief that he has supernatural abilities to read minds.  BP low - held BP meds -- today. Decrease Metoprolol  to 12.5 bid.   Past Psychiatric History: see h&P Family History:  Family History  Family history unknown: Yes   Social History:  Social History   Substance and Sexual Activity  Alcohol Use No     Social History   Substance and Sexual Activity  Drug Use No    Social History   Socioeconomic History   Marital status: Divorced    Spouse name: Not on file   Number of children: Not on file   Years of education: Not on file   Highest education level: Not on file  Occupational History   Not on file  Tobacco Use   Smoking status: Every Day    Current packs/day: 0.50    Average packs/day: 0.5 packs/day for 46.0 years (23.0 ttl pk-yrs)    Types: Cigarettes    Start date: 87   Smokeless tobacco: Never  Vaping Use   Vaping status: Never Used  Substance and Sexual Activity   Alcohol use: No   Drug use: No   Sexual activity: Not on file  Other Topics Concern   Not on file  Social History Narrative   Not on file   Social Drivers of Health   Tobacco Use: High Risk (07/18/2024)   Patient History    Smoking Tobacco Use: Every Day    Smokeless Tobacco Use: Never    Passive Exposure: Not on file  Financial Resource Strain: Not on file  Food Insecurity: Patient Unable To Answer (  07/18/2024)   Epic    Worried About Programme Researcher, Broadcasting/film/video in the Last Year: Patient unable to answer    Ran Out of Food in the Last Year: Patient unable to answer  Transportation Needs: Unmet Transportation Needs (07/18/2024)   Epic    Lack of Transportation (Medical): Yes    Lack of Transportation (Non-Medical): Yes  Physical Activity: Not  on file  Stress: Not on file  Social Connections: Patient Unable To Answer (07/18/2024)   Social Connection and Isolation Panel    Frequency of Communication with Friends and Family: Patient unable to answer    Frequency of Social Gatherings with Friends and Family: Patient unable to answer    Attends Religious Services: Patient unable to answer    Active Member of Clubs or Organizations: Patient unable to answer    Attends Banker Meetings: Patient unable to answer    Marital Status: Patient unable to answer  Depression (PHQ2-9): Not on file  Alcohol Screen: Low Risk (07/18/2024)   Alcohol Screen    Last Alcohol Screening Score (AUDIT): 0  Housing: Unknown (07/18/2024)   Epic    Unable to Pay for Housing in the Last Year: Patient unable to answer    Number of Times Moved in the Last Year: 0    Homeless in the Last Year: Patient declined  Utilities: Patient Unable To Answer (07/18/2024)   Epic    Threatened with loss of utilities: Patient unable to answer  Health Literacy: Not on file   Past Medical History:  Past Medical History:  Diagnosis Date   Biventricular failure (HCC)    CVA (cerebral vascular accident) (HCC)    Polysubstance use disorder    Schizophrenia (HCC)     Past Surgical History:  Procedure Laterality Date   APPENDECTOMY     TRACHEOSTOMY TUBE PLACEMENT N/A 03/04/2024   Procedure: CREATION, TRACHEOSTOMY;  Surgeon: [name] Massie RAMAN, MD;  Location: [facility] ORS;  Service: ENT;  Laterality: N/A;    Current Medications: Current Facility-Administered Medications  Medication Dose Route Frequency Provider Last Rate Last Admin   acetaminophen  (TYLENOL ) tablet 650 mg  650 mg Oral Q6H PRN Hampton, Tracie B, NP   650 mg at 08/22/24 0139   alum & mag hydroxide-simeth (MAALOX/MYLANTA) 200-200-20 MG/5ML suspension 30 mL  30 mL Oral Q4H PRN Hampton, Tracie B, NP   30 mL at 08/05/24 0306   amiodarone  (PACERONE ) tablet 400 mg  400 mg Oral Daily [name], [name], MD   400  mg at 08/21/24 9162   amoxicillin -clavulanate (AUGMENTIN ) 875-125 MG per tablet 1 tablet  1 tablet Oral Q12H Meriem Lemieux, MD   1 tablet at 08/22/24 1006   ARIPiprazole  (ABILIFY ) tablet 15 mg  15 mg Oral Daily [name], [name], MD   15 mg at 08/22/24 0935   ARIPiprazole  ER (ABILIFY  MAINTENA) injection 400 mg  400 mg Intramuscular Q28 days [name], [name], MD   400 mg at 07/27/24 9070   benzocaine  (ORAJEL) 10 % mucosal gel   Mouth/Throat TID Brandun Pinn, MD   Given at 08/22/24 1531   divalproex  (DEPAKOTE  ER) 24 hr tablet 1,000 mg  1,000 mg Oral QHS [name], [name], MD   1,000 mg at 08/21/24 1938   docusate sodium  (COLACE) capsule 100 mg  100 mg Oral Daily [name], [name], MD   100 mg at 08/22/24 0935   feeding supplement (ENSURE PLUS HIGH PROTEIN) liquid 237 mL  237 mL Oral TID BM [name], [name], MD  237 mL at 08/22/24 1324   hydrOXYzine  (ATARAX ) tablet 25 mg  25 mg Oral TID PRN Hampton, Tracie B, NP   25 mg at 08/18/24 0224   magnesium  hydroxide (MILK OF MAGNESIA) suspension 30 mL  30 mL Oral Daily PRN Hampton, Tracie B, NP   30 mL at 08/08/24 2233   memantine  (NAMENDA ) tablet 10 mg  10 mg Oral Daily [name], [name], MD   10 mg at 08/22/24 0935   metoprolol  tartrate (LOPRESSOR ) tablet 25 mg  25 mg Oral BID [name], [name], MD   25 mg at 08/21/24 2131   naproxen  (NAPROSYN ) tablet 250 mg  250 mg Oral BID WC [name], [name], MD   250 mg at 08/22/24 1615   OLANZapine  (ZYPREXA ) injection 5 mg  5 mg Intramuscular TID PRN Hampton, Tracie B, NP       OLANZapine  zydis (ZYPREXA ) disintegrating tablet 5 mg  5 mg Oral TID PRN Hampton, Tracie B, NP   5 mg at 08/04/24 1129   oxyCODONE  (Oxy IR/ROXICODONE ) immediate release tablet 5 mg  5 mg Oral Q4H PRN Dorinda Homans T, MD   5 mg at 08/12/24 0451   pantoprazole  (PROTONIX ) EC tablet 40 mg  40 mg Oral Daily [name], [name], MD   40 mg at 08/22/24 0935   sodium chloride  (OCEAN) 0.65 % nasal spray 1 spray  1 spray Each Nare PRN [name], [name], MD    1 spray at 08/18/24 1459   traZODone  (DESYREL ) tablet 50 mg  50 mg Oral QHS PRN Hampton, Tracie B, NP   50 mg at 08/21/24 2131    Lab Results:  No results found for this or any previous visit (from the past 48 hours).    Blood Alcohol level:  Lab Results  Component Value Date   [facility] <15 07/17/2024   ETH <15 02/20/2024    Metabolic Disorder Labs: Lab Results  Component Value Date   HGBA1C 5.4 08/12/2024   MPG 108.28 08/12/2024   MPG 119.76 07/14/2018   No results found for: PROLACTIN Lab Results  Component Value Date   CHOL 139 08/12/2024   TRIG 73 08/12/2024   HDL 52 08/12/2024   CHOLHDL 2.7 08/12/2024   VLDL 15 08/12/2024   LDLCALC 72 08/12/2024   LDLCALC 119 (H) 07/13/2018    Physical Findings: AIMS:  , ,  ,  ,    CIWA:    COWS:      Psychiatric Specialty Exam:  Presentation  General Appearance: Appropriate for Environment  Eye Contact:Fleeting  Speech:Normal Rate  Speech Volume:Decreased    Mood and Affect  Mood:Anxious  Affect:Flat   Thought Process  Thought Processes:circumstantial Orientation:Partial  Thought Content:discharge focused  Hallucinations:denies  Ideas of Reference:Delusions At baseline Suicidal Thoughts:denies  Homicidal Thoughts:denies   Sensorium  Memory:Immediate Poor; Recent Poor; Remote Poor  Judgment:Impaired  Insight:None   Executive Functions  Concentration:Poor  Attention Span:Poor  Recall:Poor  Fund of Knowledge:Poor  Language:Fair   Psychomotor Activity  Psychomotor Activity:No data recorded  Musculoskeletal: Strength & Muscle Tone: within normal limits Gait & Station: normal Assets  Assets:Communication Skills; Desire for Improvement; Social Support    Physical Exam: Physical Exam Vitals and nursing note reviewed.    ROS Blood pressure (!) 81/61, pulse 66, temperature 98.3 F (36.8 C), resp. rate 16, height 6' 1 (1.854 m), weight 81.2 kg, SpO2 97%. Body mass index is  23.62 kg/m.  Diagnosis: Active Problems:   Schizophrenia, paranoid (HCC)   Dementia with behavioral disturbance (HCC)  PLAN: Safety and Monitoring:  -- Involuntary admission to inpatient psychiatric unit for safety, stabilization and treatment  -- Daily contact with patient to assess and evaluate symptoms and progress in treatment  -- Patient's case to be discussed in multi-disciplinary team meeting  -- Observation Level : q15 minute checks  -- Vital signs:  q12 hours  -- Precautions: suicide, elopement, and assault -- Encouraged patient to participate in unit milieu and in scheduled group therapies  2. Psychiatric Treatment:  Scheduled Medications:   Abilify  10 mg due to worsening agitation and paranoia even after LAI -Abilify  10 mg daily LAY Abilify  Maintena 400 mg q4 weeks given on 07/27/24  Cogentin  1mg  one dose given for EPS Depakote  ER increased to 1000 mg to help with mood stabilization as he was receiving multiple as needed antipsychotics for agitation  -- The risks/benefits/side-effects/alternatives to this medication were discussed in detail with the patient and time was given for questions. The patient consents to medication trial.  3. Medical Issues Being Addressed:  [facility] consulted for wound care management   Assessment and Plan:   Sacral Decubitus Ulcer Full thickness decubitus ulcer in setting of recent extended hospitalization, discharged from [facility] 14[facility] Wound looks pretty good overall WOC on board we appreciate input   Cognitive Deficit Concern for anoxic brain injury at discharge 02/2024 Delirium precautions   Peripheral Vascular Disease Legs with features c/w peripheral vascular disease, diminished DP pulses Would benefit from outpatient ABI's   HFrEF Euvolemic at this time Continue home medications   Hx Atrial Flutter Not on any anticoagulation at this time EKG with NSR Continue amiodarone   Hypertension Noted, adjust meds gradually after med  rec complete   Tooth pain/Infection - Added: Augmentin  along with Orajel BP low - held BP meds -- today. Decrease Metoprolol  to 12.5 bid.   4. Discharge Planning:   -- Social work and case management to assist with discharge planning and identification of hospital follow-up needs prior to discharge  -- Estimated LOS: 3-4 days  Desmond Chimera, MD 08/22/2024, 5:56 PM

## 2024-08-22 NOTE — Plan of Care (Signed)
  Problem: Education: Goal: Knowledge of [place] [facility] Education information/materials will improve Outcome: Progressing   Problem: Activity: Goal: Interest or engagement in activities will improve Outcome: Progressing   Problem: Coping: Goal: Ability to verbalize frustrations and anger appropriately will improve Outcome: Progressing   Problem: Education: Goal: Knowledge of  General Education information/materials will improve Outcome: Progressing Goal: Emotional status will improve Outcome: Progressing Goal: Mental status will improve Outcome: Progressing Goal: Verbalization of understanding the information provided will improve Outcome: Progressing   Problem: Activity: Goal: Interest or engagement in activities will improve Outcome: Progressing Goal: Sleeping patterns will improve Outcome: Progressing   Problem: Coping: Goal: Ability to verbalize frustrations and anger appropriately will improve Outcome: Progressing Goal: Ability to demonstrate self-control will improve Outcome: Progressing   Problem: Health Behavior/Discharge Planning: Goal: Identification of resources available to assist in meeting health care needs will improve Outcome: Progressing Goal: Compliance with treatment plan for underlying cause of condition will improve Outcome: Progressing   Problem: Physical Regulation: Goal: Ability to maintain clinical measurements within normal limits will improve Outcome: Progressing   Problem: Safety: Goal: Periods of time without injury will increase Outcome: Progressing   Problem: Coping: Goal: Ability to adjust to condition or change in health will improve Outcome: Progressing   Problem: Health Behavior/Discharge Planning: Goal: Ability to identify and utilize available resources and services will improve Outcome: Progressing

## 2024-08-22 NOTE — Progress Notes (Addendum)
 Miguel [name] was pleasant and cooperative but disoriented to the situation, getting up several times during the night and visiting the nurses station. During shift change, he reported a 9/10 toothache radiating to his head and received PRN Tylenol  with good effect; at approximately 0130, he again complained of tooth pain and was given another PRN Tylenol  with good relief. He denied suicidal ideation, self-harm behaviors, homicidal ideation, and auditory or visual hallucinations. The patient was present in the milieu, interacting appropriately with peers and staff, and attended group. He was compliant with scheduled medications and required PRN Trazodone  for sleep. Although he was up a few times during the night, he voiced no complaints to the writer, no unsafe behaviors were observed, and fifteen-minute safety checks were completed throughout the shift. We will continue to monitor.   08/22/24 0249  Psych Admission Type (Psych Patients Only)  Admission Status Involuntary  Psychosocial Assessment  Patient Complaints None  Eye Contact Brief  Facial Expression Flat  Affect Flat  Speech Soft;Tangential  Interaction Minimal  Motor Activity Slow  Appearance/Hygiene Poor hygiene;Body odor;In scrubs  Behavior Characteristics Cooperative  Mood Pleasant  Aggressive Behavior  Effect No apparent injury  Thought Process  Coherency Tangential  Content Preoccupation  Delusions None reported or observed  Perception WDL  Hallucination None reported or observed  Judgment Poor  Confusion Mild  Danger to Self  Current suicidal ideation? Denies  Agreement Not to Harm Self Yes  Description of Agreement verbal  Danger to Others  Danger to Others None reported or observed

## 2024-08-22 NOTE — Progress Notes (Signed)
 Orajel and antibiotic started this shift for mouth pain. Wound care completed. Last BM reported today.    08/22/24 1200  Psych Admission Type (Psych Patients Only)  Admission Status Involuntary  Psychosocial Assessment  Patient Complaints None  Eye Contact Brief  Facial Expression Flat  Affect Appropriate to circumstance  Speech Soft  Interaction Assertive  Motor Activity Slow  Appearance/Hygiene In scrubs;Body odor  Behavior Characteristics Cooperative;Calm  Mood Pleasant  Thought Process  Coherency Tangential  Content Preoccupation  Delusions None reported or observed  Perception WDL  Hallucination None reported or observed  Judgment Poor  Confusion Mild  Danger to Self  Current suicidal ideation? Denies  Danger to Others  Danger to Others None reported or observed

## 2024-08-23 DIAGNOSIS — F03918 Unspecified dementia, unspecified severity, with other behavioral disturbance: Secondary | ICD-10-CM | POA: Diagnosis not present

## 2024-08-23 DIAGNOSIS — F200 Paranoid schizophrenia: Secondary | ICD-10-CM | POA: Diagnosis not present

## 2024-08-23 NOTE — Progress Notes (Signed)
 [facility] MD Progress Note  08/23/2024 5:06 PM Miguel [name]  MRN:  [id number]  Miguel [name]. Miguel [name] is a 73 year old male with a past psychiatric history significant for schizophrenia who presents under involuntary commitment initiated by family due to medication non-adherence, increasing paranoia, and behavioral disorganization. 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 Brother reports multiple episodes of patient whispering through the night, stating people were talking about him and planning to harm him. Patient was advised by his ACT Team to come to the ED; he refused, prompting the family to pursue IVC.Patient is admitted to [facility] unit with Q15 min safety monitoring. Multidisciplinary team approach is offered. Medication management; group/milieu therapy is offered.  Subjective:  Chart reviewed, case discussed in multidisciplinary meeting, patient seen during rounds.  Nurse report patient had been doing well.  Received trazodone  for sleep yesterday otherwise no concerning behaviors.  He reports that he is doing well.  He feels much better after getting Orajel for his toothache.  Patient kept talking about toothache and was advised to follow-up with with dentist once he discharged from here.  Patient denies any suicide homicidal ideations or any auditory visual hallucinations today.  Able to engage well during the conversation.  Patient reports that he has spent most of his time in prison and today reports that his brother is still his guardian.  He had a little distrustful about the  brother but does not want to harm his brother. Blood pressure was low yesterday.  Blood pressure medications were decreased.  Blood pressure today 133/65.  Will continue to monitor    Past Psychiatric History: see h&P Family History:  Family History  Family history unknown: Yes   Social History:  Social History   Substance and Sexual Activity  Alcohol Use No     Social History   Substance and Sexual Activity  Drug Use No    Social History   Socioeconomic History   Marital status: Divorced    Spouse name: Not on file   Number of children: Not on file   Years of education: Not on file   Highest education level: Not on file  Occupational History   Not on file  Tobacco Use   Smoking status: Every Day    Current packs/day: 0.50    Average packs/day: 0.5 packs/day for 46.0 years (23.0 ttl pk-yrs)    Types: Cigarettes    Start date: 75   Smokeless tobacco: Never  Vaping Use   Vaping status: Never Used  Substance and Sexual Activity   Alcohol use: No   Drug use: No   Sexual activity: Not on file  Other Topics Concern   Not on file  Social History Narrative   Not on file   Social Drivers of Health   Tobacco Use: High Risk (07/18/2024)   Patient History    Smoking Tobacco Use: Every Day    Smokeless Tobacco Use: Never    Passive Exposure: Not on file  Financial Resource Strain: Not on file  Food Insecurity: Patient Unable To Answer (07/18/2024)   Epic    Worried About Radiation Protection Practitioner of Food in the Last Year: Patient unable to answer    Ran Out  of Food in the Last Year: Patient unable to answer  Transportation Needs: Unmet Transportation Needs (07/18/2024)   Epic    Lack of Transportation (Medical): Yes    Lack of Transportation (Non-Medical): Yes  Physical Activity: Not on file  Stress: Not on file  Social Connections: Patient Unable To Answer (07/18/2024)   Social Connection and Isolation Panel    Frequency of Communication with Friends and Family: Patient  unable to answer    Frequency of Social Gatherings with Friends and Family: Patient unable to answer    Attends Religious Services: Patient unable to answer    Active Member of Clubs or Organizations: Patient unable to answer    Attends Banker Meetings: Patient unable to answer    Marital Status: Patient unable to answer  Depression (PHQ2-9): Not on file  Alcohol Screen: Low Risk (07/18/2024)   Alcohol Screen    Last Alcohol Screening Score (AUDIT): 0  Housing: Unknown (07/18/2024)   Epic    Unable to Pay for Housing in the Last Year: Patient unable to answer    Number of Times Moved in the Last Year: 0    Homeless in the Last Year: Patient declined  Utilities: Patient Unable To Answer (07/18/2024)   Epic    Threatened with loss of utilities: Patient unable to answer  Health Literacy: Not on file   Past Medical History:  Past Medical History:  Diagnosis Date   Biventricular failure (HCC)    CVA (cerebral vascular accident) (HCC)    Polysubstance use disorder    Schizophrenia (HCC)     Past Surgical History:  Procedure Laterality Date   APPENDECTOMY     TRACHEOSTOMY TUBE PLACEMENT N/A 03/04/2024   Procedure: CREATION, TRACHEOSTOMY;  Surgeon: [name] Massie RAMAN, MD;  Location: [facility] ORS;  Service: ENT;  Laterality: N/A;    Current Medications: Current Facility-Administered Medications  Medication Dose Route Frequency Provider Last Rate Last Admin   acetaminophen  (TYLENOL ) tablet 650 mg  650 mg Oral Q6H PRN Hampton, Tracie B, NP   650 mg at 08/22/24 0139   alum & mag hydroxide-simeth (MAALOX/MYLANTA) 200-200-20 MG/5ML suspension 30 mL  30 mL Oral Q4H PRN Hampton, Tracie B, NP   30 mL at 08/05/24 0306   amiodarone  (PACERONE ) tablet 400 mg  400 mg Oral Daily [name], [name], MD   400 mg at 08/23/24 0935   amoxicillin -clavulanate (AUGMENTIN ) 875-125 MG per tablet 1 tablet  1 tablet Oral Q12H Jimeka Balan, MD   1 tablet at 08/23/24 0934   ARIPiprazole  (ABILIFY )  tablet 15 mg  15 mg Oral Daily [name], [name], MD   15 mg at 08/23/24 0934   ARIPiprazole  ER (ABILIFY  MAINTENA) injection 400 mg  400 mg Intramuscular Q28 days [name], [name], MD   400 mg at 07/27/24 9070   benzocaine  (ORAJEL) 10 % mucosal gel   Mouth/Throat TID Zain Lankford, MD   Given at 08/23/24 1601   divalproex  (DEPAKOTE  ER) 24 hr tablet 1,000 mg  1,000 mg Oral QHS [name], [name], MD   1,000 mg at 08/22/24 2026   docusate sodium  (COLACE) capsule 100 mg  100 mg Oral Daily [name], [name], MD   100 mg at 08/23/24 0935   feeding supplement (ENSURE PLUS HIGH PROTEIN) liquid 237 mL  237 mL Oral TID BM [name], [name], MD   237 mL at 08/23/24 1443   hydrOXYzine  (ATARAX ) tablet 25 mg  25 mg Oral TID PRN Hampton, Tracie B, NP   25 mg  at 08/18/24 0224   magnesium  hydroxide (MILK OF MAGNESIA) suspension 30 mL  30 mL Oral Daily PRN Hampton, Tracie B, NP   30 mL at 08/08/24 2233   memantine  (NAMENDA ) tablet 10 mg  10 mg Oral Daily [name], [name], MD   10 mg at 08/23/24 0934   metoprolol  tartrate (LOPRESSOR ) tablet 12.5 mg  12.5 mg Oral BID Valyn Latchford, MD   12.5 mg at 08/23/24 9065   naproxen  (NAPROSYN ) tablet 250 mg  250 mg Oral BID WC [name], [name], MD   250 mg at 08/23/24 1601   OLANZapine  (ZYPREXA ) injection 5 mg  5 mg Intramuscular TID PRN Hampton, Tracie B, NP       OLANZapine  zydis (ZYPREXA ) disintegrating tablet 5 mg  5 mg Oral TID PRN Hampton, Tracie B, NP   5 mg at 08/04/24 1129   oxyCODONE  (Oxy IR/ROXICODONE ) immediate release tablet 5 mg  5 mg Oral Q4H PRN Dorinda Homans T, MD   5 mg at 08/12/24 0451   pantoprazole  (PROTONIX ) EC tablet 40 mg  40 mg Oral Daily [name], [name], MD   40 mg at 08/23/24 0935   sodium chloride  (OCEAN) 0.65 % nasal spray 1 spray  1 spray Each Nare PRN Donnelly Mellow, MD   1 spray at 08/18/24 1459   traZODone  (DESYREL ) tablet 50 mg  50 mg Oral QHS PRN Hampton, Tracie B, NP   50 mg at 08/22/24 2114    Lab Results:  No results found for this  or any previous visit (from the past 48 hours).    Blood Alcohol level:  Lab Results  Component Value Date   [facility] <15 07/17/2024   ETH <15 02/20/2024    Metabolic Disorder Labs: Lab Results  Component Value Date   HGBA1C 5.4 08/12/2024   MPG 108.28 08/12/2024   MPG 119.76 07/14/2018   No results found for: PROLACTIN Lab Results  Component Value Date   CHOL 139 08/12/2024   TRIG 73 08/12/2024   HDL 52 08/12/2024   CHOLHDL 2.7 08/12/2024   VLDL 15 08/12/2024   LDLCALC 72 08/12/2024   LDLCALC 119 (H) 07/13/2018    Physical Findings: AIMS:  , ,  ,  ,    CIWA:    COWS:      Psychiatric Specialty Exam:  Presentation  General Appearance: Appropriate for Environment  Eye Contact:Fleeting  Speech:Normal Rate  Speech Volume:Decreased    Mood and Affect  Mood:Anxious  Affect:Flat   Thought Process  Thought Processes:circumstantial Orientation:Partial  Thought Content:discharge focused  Hallucinations:denies  Ideas of Reference:Delusions At baseline Suicidal Thoughts:denies  Homicidal Thoughts:denies   Sensorium  Memory:Immediate Poor; Recent Poor; Remote Poor  Judgment:Impaired  Insight:None   Executive Functions  Concentration:Poor  Attention Span:Poor  Recall:Poor  Fund of Knowledge:Poor  Language:Fair   Psychomotor Activity  Psychomotor Activity:No data recorded  Musculoskeletal: Strength & Muscle Tone: within normal limits Gait & Station: normal Assets  Assets:Communication Skills; Desire for Improvement; Social Support    Physical Exam: Physical Exam Vitals and nursing note reviewed.    ROS Blood pressure 133/65, pulse 71, temperature 97.6 F (36.4 C), resp. rate 18, height 6' 1 (1.854 m), weight 81.2 kg, SpO2 97%. Body mass index is 23.62 kg/m.  Diagnosis: Active Problems:   Schizophrenia, paranoid (HCC)   Dementia with behavioral disturbance (HCC)   PLAN: Safety and Monitoring:  -- Involuntary  admission to inpatient psychiatric unit for safety, stabilization and treatment  -- Daily contact with patient to assess and  evaluate symptoms and progress in treatment  -- Patient's case to be discussed in multi-disciplinary team meeting  -- Observation Level : q15 minute checks  -- Vital signs:  q12 hours  -- Precautions: suicide, elopement, and assault -- Encouraged patient to participate in unit milieu and in scheduled group therapies  2. Psychiatric Treatment:  Scheduled Medications:   Abilify  10 mg due to worsening agitation and paranoia even after LAI -Abilify  10 mg daily LAY Abilify  Maintena 400 mg q4 weeks given on 07/27/24  Cogentin  1mg  one dose given for EPS Depakote  ER increased to 1000 mg to help with mood stabilization as he was receiving multiple as needed antipsychotics for agitation  -- The risks/benefits/side-effects/alternatives to this medication were discussed in detail with the patient and time was given for questions. The patient consents to medication trial.  3. Medical Issues Being Addressed:  [facility] consulted for wound care management   Assessment and Plan:   Sacral Decubitus Ulcer Full thickness decubitus ulcer in setting of recent extended hospitalization, discharged from [facility] 14[facility] Wound looks pretty good overall WOC on board we appreciate input   Cognitive Deficit Concern for anoxic brain injury at discharge 02/2024 Delirium precautions   Peripheral Vascular Disease Legs with features c/w peripheral vascular disease, diminished DP pulses Would benefit from outpatient ABI's   HFrEF Euvolemic at this time Continue home medications   Hx Atrial Flutter Not on any anticoagulation at this time EKG with NSR Continue amiodarone   Hypertension Noted, adjust meds gradually after med rec complete   Tooth pain/Infection - Added: Augmentin  along with Orajel BP -better today no longer hypotensive-Will continue reduced dose of metoprolol  12.5 bid.   4.  Discharge Planning:   -- Social work and case management to assist with discharge planning and identification of hospital follow-up needs prior to discharge  -- Estimated LOS: 3-4 days  Desmond Chimera, MD 08/23/2024, 5:06 PM

## 2024-08-23 NOTE — Plan of Care (Signed)
  Problem: Education: Goal: Knowledge of [place] [facility] Education information/materials will improve Outcome: Progressing   Problem: Activity: Goal: Interest or engagement in activities will improve Outcome: Progressing   Problem: Coping: Goal: Ability to verbalize frustrations and anger appropriately will improve Outcome: Progressing   Problem: Education: Goal: Knowledge of  General Education information/materials will improve Outcome: Progressing Goal: Emotional status will improve Outcome: Progressing Goal: Mental status will improve Outcome: Progressing Goal: Verbalization of understanding the information provided will improve Outcome: Progressing   Problem: Activity: Goal: Interest or engagement in activities will improve Outcome: Progressing Goal: Sleeping patterns will improve Outcome: Progressing   Problem: Coping: Goal: Ability to verbalize frustrations and anger appropriately will improve Outcome: Progressing Goal: Ability to demonstrate self-control will improve Outcome: Progressing   Problem: Health Behavior/Discharge Planning: Goal: Identification of resources available to assist in meeting health care needs will improve Outcome: Progressing Goal: Compliance with treatment plan for underlying cause of condition will improve Outcome: Progressing   Problem: Physical Regulation: Goal: Ability to maintain clinical measurements within normal limits will improve Outcome: Progressing   Problem: Safety: Goal: Periods of time without injury will increase Outcome: Progressing   Problem: Coping: Goal: Ability to adjust to condition or change in health will improve Outcome: Progressing   Problem: Health Behavior/Discharge Planning: Goal: Ability to identify and utilize available resources and services will improve Outcome: Progressing

## 2024-08-23 NOTE — Group Note (Signed)
 Date:  08/23/2024 Time:  9:57 PM  Group Topic/Focus:  Wrap-Up Group:   The focus of this group is to help patients review their daily goal of treatment and discuss progress on daily workbooks.    Participation Level:  Active  Participation Quality:  Appropriate  Affect:  Appropriate  Cognitive:  Alert  Insight: Appropriate  Engagement in Group:  Engaged  Modes of Intervention:  Discussion  Additional Comments:    Miguel [name] 08/23/2024, 9:57 PM

## 2024-08-23 NOTE — Group Note (Signed)
 Date:  08/23/2024 Time:  3:58 PM  Group Topic/Focus:  Karaoke Group: The purpose of this group is for patients to interact with each other while singing their favorite songs.     Participation Level:  Active  Participation Quality:  Appropriate  Affect:  Appropriate  Cognitive:  Appropriate  Insight: Appropriate  Engagement in Group:  Engaged  Modes of Intervention:  Activity  Additional Comments:    Miguel [name] 08/23/2024, 3:58 PM

## 2024-08-23 NOTE — Group Note (Signed)
 Date:  08/23/2024 Time:  11:05 AM  Group Topic/Focus:  Coping With Mental Health Crisis:   The purpose of this group is to help patients identify strategies for coping with mental health crisis.  Group discusses possible causes of crisis and ways to manage them effectively.    Participation Level:  Did Not Attend    Miguel [name] 08/23/2024, 11:05 AM

## 2024-08-23 NOTE — Progress Notes (Signed)
 Patient accepted medications without issue this shift. Wound care completed. Old dressing was in place. Patient present in dayroom for much of the day. Observed smiling and interacting with peers. Ongoing monitoring continues.   08/23/24 1300  Psych Admission Type (Psych Patients Only)  Admission Status Involuntary  Psychosocial Assessment  Patient Complaints None  Eye Contact Brief  Facial Expression Flat  Affect Appropriate to circumstance  Speech Soft  Interaction Assertive  Motor Activity Slow  Appearance/Hygiene In scrubs  Behavior Characteristics Cooperative  Mood Pleasant  Thought Process  Coherency Tangential  Content Preoccupation  Delusions None reported or observed  Perception WDL  Hallucination None reported or observed  Judgment Poor  Confusion Mild  Danger to Self  Current suicidal ideation? Denies  Danger to Others  Danger to Others None reported or observed

## 2024-08-23 NOTE — Plan of Care (Signed)
" °  Problem: Coping: Goal: Ability to verbalize frustrations and anger appropriately will improve Outcome: Not Progressing   Problem: Education: Goal: Emotional status will improve Outcome: Not Progressing Goal: Mental status will improve Outcome: Not Progressing Goal: Verbalization of understanding the information provided will improve Outcome: Not Progressing   Problem: Activity: Goal: Sleeping patterns will improve Outcome: Not Progressing   "

## 2024-08-24 DIAGNOSIS — F03918 Unspecified dementia, unspecified severity, with other behavioral disturbance: Secondary | ICD-10-CM | POA: Diagnosis not present

## 2024-08-24 DIAGNOSIS — F200 Paranoid schizophrenia: Secondary | ICD-10-CM | POA: Diagnosis not present

## 2024-08-24 NOTE — Group Note (Signed)
 Date:  08/24/2024 Time:  11:24 PM  Group Topic/Focus:  Wrap-Up Group:   The focus of this group is to help patients review their daily goal of treatment and discuss progress on daily workbooks.    Participation Level:  Did Not Attend  Participation Quality:     Affect:     Cognitive:     Insight: None  Engagement in Group:  None  Modes of Intervention:     Additional Comments:    [name] 08/24/2024, 11:24 PM

## 2024-08-24 NOTE — Plan of Care (Signed)
  Problem: Education: Goal: Knowledge of [place] [facility] Education information/materials will improve Outcome: Progressing   Problem: Activity: Goal: Interest or engagement in activities will improve Outcome: Progressing   Problem: Coping: Goal: Ability to verbalize frustrations and anger appropriately will improve Outcome: Progressing   Problem: Education: Goal: Knowledge of  General Education information/materials will improve Outcome: Progressing Goal: Emotional status will improve Outcome: Progressing Goal: Mental status will improve Outcome: Progressing Goal: Verbalization of understanding the information provided will improve Outcome: Progressing   Problem: Activity: Goal: Interest or engagement in activities will improve Outcome: Progressing Goal: Sleeping patterns will improve Outcome: Progressing   Problem: Coping: Goal: Ability to verbalize frustrations and anger appropriately will improve Outcome: Progressing Goal: Ability to demonstrate self-control will improve Outcome: Progressing   Problem: Health Behavior/Discharge Planning: Goal: Identification of resources available to assist in meeting health care needs will improve Outcome: Progressing Goal: Compliance with treatment plan for underlying cause of condition will improve Outcome: Progressing   Problem: Physical Regulation: Goal: Ability to maintain clinical measurements within normal limits will improve Outcome: Progressing   Problem: Safety: Goal: Periods of time without injury will increase Outcome: Progressing   Problem: Coping: Goal: Ability to adjust to condition or change in health will improve Outcome: Progressing   Problem: Health Behavior/Discharge Planning: Goal: Ability to identify and utilize available resources and services will improve Outcome: Progressing

## 2024-08-24 NOTE — Progress Notes (Signed)
 Behavior:  Mild confusion.  Pleasant and cooperative.   Psych assessment:  Denies SI/HI and AVH.  Interaction / Group attendance:  Present in the milieu.  Appropriate interaction with peers and staff.  Attends groups.  Medication/ PRNs:  BP medications held.  Dr. GORMAN aware.  Refused orajel.  I never had any mouth or tooth pain.  Why do you keep asking?   Pain: Denies  15 min checks in place for safety.

## 2024-08-24 NOTE — Progress Notes (Signed)
" °   08/24/24 9367  Psych Admission Type (Psych Patients Only)  Admission Status Involuntary  Psychosocial Assessment  Patient Complaints None  Eye Contact Brief  Facial Expression Flat  Affect Appropriate to circumstance  Speech Soft  Interaction Assertive  Motor Activity Slow  Appearance/Hygiene In scrubs  Behavior Characteristics Cooperative  Mood Pleasant  Aggressive Behavior  Effect No apparent injury  Thought Process  Coherency Tangential  Content Preoccupation  Delusions None reported or observed  Perception WDL  Hallucination None reported or observed  Judgment Poor  Confusion Mild  Danger to Self  Current suicidal ideation? Denies  Agreement Not to Harm Self Yes  Description of Agreement verbal  Danger to Others  Danger to Others None reported or observed    "

## 2024-08-24 NOTE — Group Note (Signed)
 Date:  08/24/2024 Time:  10:43 AM  Group Topic/Focus:    For geriatric mental health patients, effective exercises combine gentle movement, mind-body practices, and social engagement, focusing on activities like walking in nature, yoga, Tai Chi, water  aerobics, and light strength training, which boost mood, reduce anxiety, improve sleep, and fight cognitive decline by increasing blood flow and endorphins, while also providing structure and purpose. Small amounts of daily activity (even 10 minutes) help, with a mix of aerobic, balance, and strength exercises recommended for overall well-being.    Participation Level:  Did Not Attend   Miguel [name] 08/24/2024, 10:43 AM

## 2024-08-24 NOTE — Group Note (Signed)
 Recreation Therapy Group Note   Group Topic:Coping Skills  Group Date: 08/24/2024 Start Time: 1415 End Time: 1440 Facilitators: Celestia Jeoffrey BRAVO, LRT, CTRS Location: Courtyard  Group Description: Music. Patients are encouraged to name their favorite song(s) for LRT to play song through speaker for group to hear, while in the courtyard getting fresh air and sunlight. Patients educated on the definition of leisure and the importance of having different leisure interests outside of the hospital. Group discussed how leisure activities can often be used as pharmacologist and that listening to music and being outside are examples.    Goal Area(s) Addressed:  Patient will identify a current leisure interest.  Patient will practice making a positive decision. Patient will have the opportunity to try a new leisure activity.   Affect/Mood: Appropriate   Participation Level: Active and Engaged   Participation Quality: Independent   Behavior: Cooperative   Speech/Thought Process: Loose association   Insight: Fair   Judgement: Fair    Modes of Intervention: Exploration, Music, Rapport Building, and Socialization   Patient Response to Interventions:  Receptive   Education Outcome:  In group clarification offered    Clinical Observations/Individualized Feedback: Garner was active in their participation of session activities and group discussion. Pt identified that he use to listen to music when I was 73 years old, they made this. Pt interacted well with LRT and peers duration of session.    Plan: Continue to engage patient in RT group sessions 2-3x/week.   Jeoffrey BRAVO Celestia, LRT, CTRS 08/24/2024 4:43 PM

## 2024-08-24 NOTE — Plan of Care (Signed)
" °  Problem: Activity: Goal: Interest or engagement in activities will improve Outcome: Progressing   Problem: Activity: Goal: Interest or engagement in activities will improve Outcome: Progressing   Problem: Education: Goal: Mental status will improve Outcome: Not Progressing Goal: Verbalization of understanding the information provided will improve Outcome: Not Progressing   "

## 2024-08-24 NOTE — Group Note (Signed)
 Physical/Occupational Therapy Group Note  Group Topic: Yoga  Group Date: 08/24/2024 Start Time: 1305 End Time: 1333 Facilitators: Erby Sanderson, Alm Hamilton, PT   Group Description: Group participated with series of yoga poses, designed to emphasize functional sitting balance, core stability, generalized flexibility and overall posture.  Incorporated deep breathing techniques with poses, working to promote relaxation, mindfulness and focus with targeted activities.  Discussed benefits of yoga in improving mood and self-esteem, reducing stress and anxiety, and promoting functional strength, balance and core stability for each participant.  Discussed ways to integrate into each participants daily routine.  Provided handout with written and pictorial descriptions of included yoga movements to be utilized as appropriate outside of group time.  Therapeutic Goal(s):  Demonstrate safe ability to participate with yoga poses during group activity. Identify one benefit of participation with yoga poses as part of each participants exercise/movement routine. Identify 1-2 individual poses that participant feels most beneficial to his/her needs and that he/she can easily replicate outside of group.  Individual Participation: Pt present during the session but did not participate in discussion and participated only minimally during the activity portion of the session.  Pt noted to drift off to sleep frequently during the session.    Participation Level: Minimal   Participation Quality: Minimal Cues   Behavior: Passive and Reserved   Speech/Thought Process: Did not participate in verbal discussion   Affect/Mood: Flat   Insight: Difficult to assess due to pt often sleeping during the session   Judgement: Difficult to assess  due to pt often sleeping during the session   Modes of Intervention: Activity, Discussion, and Education  Patient Response to Interventions:  Disengaged   Plan: Continue to engage  patient in PT/OT groups 1 - 2x/week.  CHARM Hamilton Bertin PT, DPT 08/24/2024, 1:48 PM

## 2024-08-24 NOTE — Progress Notes (Signed)
" °   08/24/24 2000  Psych Admission Type (Psych Patients Only)  Admission Status Involuntary  Psychosocial Assessment  Patient Complaints None  Eye Contact Brief  Facial Expression Flat  Affect Flat  Speech Soft  Interaction Assertive  Motor Activity Slow  Appearance/Hygiene In scrubs  Behavior Characteristics Cooperative  Mood Pleasant  Thought Process  Coherency Tangential  Content Preoccupation  Delusions None reported or observed  Perception WDL  Hallucination None reported or observed  Judgment Poor  Confusion Mild  Danger to Self  Current suicidal ideation? Denies  Agreement Not to Harm Self Yes  Description of Agreement verbal  Danger to Others  Danger to Others None reported or observed    "

## 2024-08-24 NOTE — Progress Notes (Signed)
 [facility] MD Progress Note  08/24/2024 1:03 PM MARCIAL PLESS  MRN:  [id number]  [place]oxx Boeding. Miguel [name] is a 73 year old male with a past psychiatric history significant for schizophrenia who presents under involuntary commitment initiated by family due to medication non-adherence, increasing paranoia, and behavioral disorganization. 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 Brother reports multiple episodes of patient whispering through the night, stating people were talking about him and planning to harm him. Patient was advised by his ACT Team to come to the ED; he refused, prompting the family to pursue IVC.Patient is admitted to [facility] unit with Q15 min safety monitoring. Multidisciplinary team approach is offered. Medication management; group/milieu therapy is offered.  Subjective:  Chart reviewed, case discussed in multidisciplinary meeting, patient seen during rounds.  Per staff report patient is doing well.  Able to engage.  Blood pressure stable 113/56.  Patient on interview reports that he has right-sided leg pain on the thigh.  Today reports that it is going on since last 2 to 3 weeks.  Otherwise he is reports that he is doing good.  Does not report any injury.  Reports that the pain is tolerable.  Able to walk.  Patient denies any suicide homicidal ideations or any auditory visual hallucination.  Patient denies that he does not want to harm his brother who is the legal guardian.  Patient is due for his Abilify  injection today.  Discontinue oral Abilify  as he already got Abilify   injection on 07/27/2024 also.    Past Psychiatric History: see h&P Family History:  Family History  Family history unknown: Yes   Social History:  Social History   Substance and Sexual Activity  Alcohol Use No     Social History   Substance and Sexual Activity  Drug Use No    Social History   Socioeconomic History   Marital status: Divorced    Spouse name: Not on file   Number of children: Not on file   Years of education: Not on file   Highest education level: Not on file  Occupational History   Not on file  Tobacco Use   Smoking status: Every Day    Current packs/day: 0.50    Average packs/day: 0.5 packs/day for 46.0 years (23.0 ttl pk-yrs)    Types: Cigarettes    Start date: 40   Smokeless tobacco: Never  Vaping Use   Vaping status: Never Used  Substance and Sexual Activity   Alcohol use: No   Drug use: No   Sexual activity: Not on file  Other Topics Concern   Not on file  Social History Narrative   Not on file   Social Drivers of Health   Tobacco Use: High Risk (07/18/2024)   Patient History    Smoking Tobacco Use: Every Day    Smokeless Tobacco Use: Never    Passive Exposure: Not on file  Financial Resource Strain: Not on file  Food Insecurity: Patient Unable To Answer (07/18/2024)   Epic    Worried About Programme Researcher, Broadcasting/film/video in the Last Year: Patient unable to answer    Ran Out of Food in the Last Year: Patient unable to answer  Transportation Needs: Unmet Transportation  Needs (07/18/2024)   Epic    Lack of Transportation (Medical): Yes    Lack of Transportation (Non-Medical): Yes  Physical Activity: Not on file  Stress: Not on file  Social Connections: Patient Unable To Answer (07/18/2024)   Social Connection and Isolation Panel    Frequency of Communication with Friends and Family: Patient unable to answer    Frequency of Social Gatherings with Friends and Family: Patient unable to answer    Attends Religious Services: Patient unable to  answer    Active Member of Clubs or Organizations: Patient unable to answer    Attends Banker Meetings: Patient unable to answer    Marital Status: Patient unable to answer  Depression (PHQ2-9): Not on file  Alcohol Screen: Low Risk (07/18/2024)   Alcohol Screen    Last Alcohol Screening Score (AUDIT): 0  Housing: Unknown (07/18/2024)   Epic    Unable to Pay for Housing in the Last Year: Patient unable to answer    Number of Times Moved in the Last Year: 0    Homeless in the Last Year: Patient declined  Utilities: Patient Unable To Answer (07/18/2024)   Epic    Threatened with loss of utilities: Patient unable to answer  Health Literacy: Not on file   Past Medical History:  Past Medical History:  Diagnosis Date   Biventricular failure (HCC)    CVA (cerebral vascular accident) (HCC)    Polysubstance use disorder    Schizophrenia (HCC)     Past Surgical History:  Procedure Laterality Date   APPENDECTOMY     TRACHEOSTOMY TUBE PLACEMENT N/A 03/04/2024   Procedure: CREATION, TRACHEOSTOMY;  Surgeon: [name] Massie RAMAN, MD;  Location: [facility] ORS;  Service: ENT;  Laterality: N/A;    Current Medications: Current Facility-Administered Medications  Medication Dose Route Frequency Provider Last Rate Last Admin   acetaminophen  (TYLENOL ) tablet 650 mg  650 mg Oral Q6H PRN Hampton, Tracie B, NP   650 mg at 08/22/24 0139   alum & mag hydroxide-simeth (MAALOX/MYLANTA) 200-200-20 MG/5ML suspension 30 mL  30 mL Oral Q4H PRN Hampton, Tracie B, NP   30 mL at 08/05/24 0306   amiodarone  (PACERONE ) tablet 400 mg  400 mg Oral Daily [name], [name], MD   400 mg at 08/23/24 0935   amoxicillin -clavulanate (AUGMENTIN ) 875-125 MG per tablet 1 tablet  1 tablet Oral Q12H Camille Dragan, MD   1 tablet at 08/24/24 0914   ARIPiprazole  ER (ABILIFY  MAINTENA) injection 400 mg  400 mg Intramuscular Q28 days [name], [name], MD   400 mg at 08/24/24 9065   benzocaine  (ORAJEL) 10 % mucosal gel    Mouth/Throat TID Armaan Pond, MD   Given at 08/23/24 2158   divalproex  (DEPAKOTE  ER) 24 hr tablet 1,000 mg  1,000 mg Oral QHS [name], [name], MD   1,000 mg at 08/23/24 2012   docusate sodium  (COLACE) capsule 100 mg  100 mg Oral Daily [name], [name], MD   100 mg at 08/24/24 0913   feeding supplement (ENSURE PLUS HIGH PROTEIN) liquid 237 mL  237 mL Oral TID BM [name], [name], MD   237 mL at 08/24/24 0919   hydrOXYzine  (ATARAX ) tablet 25 mg  25 mg Oral TID PRN Hampton, Tracie B, NP   25 mg at 08/18/24 0224   magnesium  hydroxide (MILK OF MAGNESIA) suspension 30 mL  30 mL Oral Daily PRN Hampton, Tracie B, NP   30 mL at 08/08/24 2233   memantine  (NAMENDA ) tablet 10 mg  10 mg Oral Daily [name], [name], MD   10 mg at 08/24/24 9085   metoprolol  tartrate (LOPRESSOR ) tablet 12.5 mg  12.5 mg Oral BID Shawnice Tilmon, MD   12.5 mg at 08/23/24 2156   naproxen  (NAPROSYN ) tablet 250 mg  250 mg Oral BID WC [name], [name], MD   250 mg at 08/24/24 0913   OLANZapine  (ZYPREXA ) injection 5 mg  5 mg Intramuscular TID PRN Hampton, Tracie B, NP       OLANZapine  zydis (ZYPREXA ) disintegrating tablet 5 mg  5 mg Oral TID PRN Hampton, Tracie B, NP   5 mg at 08/04/24 1129   oxyCODONE  (Oxy IR/ROXICODONE ) immediate release tablet 5 mg  5 mg Oral Q4H PRN Dorinda Homans T, MD   5 mg at 08/12/24 0451   pantoprazole  (PROTONIX ) EC tablet 40 mg  40 mg Oral Daily [name], [name], MD   40 mg at 08/24/24 0913   sodium chloride  (OCEAN) 0.65 % nasal spray 1 spray  1 spray Each Nare PRN Donnelly Mellow, MD   1 spray at 08/18/24 1459   traZODone  (DESYREL ) tablet 50 mg  50 mg Oral QHS PRN Hampton, Tracie B, NP   50 mg at 08/22/24 2114    Lab Results:  No results found for this or any previous visit (from the past 48 hours).    Blood Alcohol level:  Lab Results  Component Value Date   [facility] <15 07/17/2024   ETH <15 02/20/2024    Metabolic Disorder Labs: Lab Results  Component Value Date   HGBA1C 5.4 08/12/2024    MPG 108.28 08/12/2024   MPG 119.76 07/14/2018   No results found for: PROLACTIN Lab Results  Component Value Date   CHOL 139 08/12/2024   TRIG 73 08/12/2024   HDL 52 08/12/2024   CHOLHDL 2.7 08/12/2024   VLDL 15 08/12/2024   LDLCALC 72 08/12/2024   LDLCALC 119 (H) 07/13/2018    Physical Findings: AIMS:  , ,  ,  ,    CIWA:    COWS:      Psychiatric Specialty Exam:  Presentation  General Appearance: Appropriate for Environment  Eye Contact:Fleeting  Speech:Normal Rate  Speech Volume:Decreased    Mood and Affect  Mood:Anxious  Affect:Flat   Thought Process  Thought Processes:circumstantial Orientation:Partial  Thought Content:discharge focused  Hallucinations:denies  Ideas of Reference:Delusions At baseline Suicidal Thoughts:denies  Homicidal Thoughts:denies   Sensorium  Memory:Immediate Poor; Recent Poor; Remote Poor  Judgment:Impaired  Insight:None   Executive Functions  Concentration:Poor  Attention Span:Poor  Recall:Poor  Fund of Knowledge:Poor  Language:Fair   Psychomotor Activity  Psychomotor Activity:No data recorded  Musculoskeletal: Strength & Muscle Tone: within normal limits Gait & Station: normal Assets  Assets:Communication Skills; Desire for Improvement; Social Support    Physical Exam: Physical Exam Vitals and nursing note reviewed.    ROS Blood pressure 90/63, pulse 73, temperature 98 F (36.7 C), resp. rate 18, height 6' 1 (1.854 m), weight 81.2 kg, SpO2 94%. Body mass index is 23.62 kg/m.  Diagnosis: Active Problems:   Schizophrenia, paranoid (HCC)   Dementia with behavioral disturbance (HCC)   PLAN: Safety and Monitoring:  -- Involuntary admission to inpatient psychiatric unit for safety, stabilization and treatment  -- Daily contact with patient to assess and evaluate symptoms and progress in treatment  -- Patient's case to be discussed in multi-disciplinary team meeting  -- Observation Level  : q15 minute checks  -- Vital signs:  q12 hours  -- Precautions: suicide, elopement, and  assault -- Encouraged patient to participate in unit milieu and in scheduled group therapies  2. Psychiatric Treatment:  Scheduled Medications:  Discontinued oral Abilify  LAY Abilify  Maintena 400 mg q4 weeks given on 07/27/24, 08/24/2024  Cogentin  1mg  one dose given for EPS Depakote  ER increased to 1000 mg to help with mood stabilization as he was receiving multiple as needed antipsychotics for agitation  -- The risks/benefits/side-effects/alternatives to this medication were discussed in detail with the patient and time was given for questions. The patient consents to medication trial.  3. Medical Issues Being Addressed:  [facility] consulted for wound care management   Assessment and Plan:   Sacral Decubitus Ulcer Full thickness decubitus ulcer in setting of recent extended hospitalization, discharged from [facility] Nov 14th Wound looks pretty good overall WOC on board we appreciate input   Cognitive Deficit Concern for anoxic brain injury at discharge 02/2024 Delirium precautions   Peripheral Vascular Disease Legs with features c/w peripheral vascular disease, diminished DP pulses Would benefit from outpatient ABI's   HFrEF Euvolemic at this time Continue home medications   Hx Atrial Flutter Not on any anticoagulation at this time EKG with NSR Continue amiodarone   Hypertension Noted, adjust meds gradually after med rec complete   Tooth pain/Infection - Added: Augmentin  along with Orajel BP -better today no longer hypotensive-Will continue reduced dose of metoprolol  12.5 bid.   4. Discharge Planning:   -- Social work and case management to assist with discharge planning and identification of hospital follow-up needs prior to discharge  -- Estimated LOS: 3-4 days  Desmond Chimera, MD 08/24/2024, 1:03 PM

## 2024-08-25 DIAGNOSIS — F03918 Unspecified dementia, unspecified severity, with other behavioral disturbance: Secondary | ICD-10-CM | POA: Diagnosis not present

## 2024-08-25 DIAGNOSIS — F200 Paranoid schizophrenia: Secondary | ICD-10-CM | POA: Diagnosis not present

## 2024-08-25 NOTE — Group Note (Signed)
 Date:  08/25/2024 Time:  8:36 PM  Group Topic/Focus:  Wrap-Up Group:   The focus of this group is to help patients review their daily goal of treatment and discuss progress on daily workbooks.    Participation Level:  Did Not Attend  Miguel [name] 08/25/2024, 8:36 PM

## 2024-08-25 NOTE — Progress Notes (Signed)
(  Sleep Hours) - 5.25 (Any PRNs that were needed, meds refused, or side effects to meds)- Trazadone 10/10  (Any disturbances and when (visitation, over night)-None reported/observed (Concerns raised by the patient)- None reported/observed (SI/HI/AVH)- Denies

## 2024-08-25 NOTE — Progress Notes (Signed)
 [facility] MD Progress Note  08/25/2024 12:36 PM Miguel [name]  MRN:  [id number]  Miguel [name]. Gerstner is a 73 year old male with a past psychiatric history significant for schizophrenia who presents under involuntary commitment initiated by family due to medication non-adherence, increasing paranoia, and behavioral disorganization. 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 Brother reports multiple episodes of patient whispering through the night, stating people were talking about him and planning to harm him. Patient was advised by his ACT Team to come to the ED; he refused, prompting the family to pursue IVC.Patient is admitted to [facility] unit with Q15 min safety monitoring. Multidisciplinary team approach is offered. Medication management; group/milieu therapy is offered.  Subjective:  Chart reviewed, case discussed in multidisciplinary meeting, patient seen during rounds.  Per staff report patient blood pressure has been stable 122/80.  Having sleeping okay.  Reportedly last night he slept for 5 hours.  Patient on interview reports that he is doing well.  Denies any suicidal homicidal ideations or any auditory visual hallucinations.  Denied that he does not want to harm his brother.  Patient has received Abilify  injection.  Abilify  oral dose have been discontinued.  No concerns at this time.     Past Psychiatric History: see h&P Family History:  Family History  Family history unknown: Yes   Social History:  Social History   Substance and Sexual Activity  Alcohol Use No      Social History   Substance and Sexual Activity  Drug Use No    Social History   Socioeconomic History   Marital status: Divorced    Spouse name: Not on file   Number of children: Not on file   Years of education: Not on file   Highest education level: Not on file  Occupational History   Not on file  Tobacco Use   Smoking status: Every Day    Current packs/day: 0.50    Average packs/day: 0.5 packs/day for 46.0 years (23.0 ttl pk-yrs)    Types: Cigarettes    Start date: 81   Smokeless tobacco: Never  Vaping Use   Vaping status: Never Used  Substance and Sexual Activity   Alcohol use: No   Drug use: No   Sexual activity: Not on file  Other Topics Concern   Not on file  Social History Narrative   Not on file   Social Drivers of Health   Tobacco Use: High Risk (07/18/2024)   Patient History    Smoking Tobacco Use: Every Day    Smokeless Tobacco Use: Never    Passive Exposure: Not on file  Financial Resource Strain: Not on file  Food Insecurity: Patient Unable To Answer (07/18/2024)   Epic    Worried About Programme Researcher, Broadcasting/film/video in the Last Year: Patient unable to answer    Ran Out of Food in the Last Year: Patient unable to answer  Transportation Needs: Unmet Transportation Needs (07/18/2024)   Epic    Lack of Transportation (Medical): Yes    Lack of Transportation (Non-Medical): Yes  Physical Activity: Not on file  Stress: Not on file  Social Connections: Patient Unable To Answer (07/18/2024)   Social Connection and Isolation Panel  Frequency of Communication with Friends and Family: Patient unable to answer    Frequency of Social Gatherings with Friends and Family: Patient unable to answer    Attends Religious Services: Patient unable to answer    Active Member of Clubs or Organizations: Patient unable to answer    Attends Banker Meetings: Patient unable to answer    Marital Status: Patient unable to answer  Depression (PHQ2-9): Not on file   Alcohol Screen: Low Risk (07/18/2024)   Alcohol Screen    Last Alcohol Screening Score (AUDIT): 0  Housing: Unknown (07/18/2024)   Epic    Unable to Pay for Housing in the Last Year: Patient unable to answer    Number of Times Moved in the Last Year: 0    Homeless in the Last Year: Patient declined  Utilities: Patient Unable To Answer (07/18/2024)   Epic    Threatened with loss of utilities: Patient unable to answer  Health Literacy: Not on file   Past Medical History:  Past Medical History:  Diagnosis Date   Biventricular failure (HCC)    CVA (cerebral vascular accident) (HCC)    Polysubstance use disorder    Schizophrenia (HCC)     Past Surgical History:  Procedure Laterality Date   APPENDECTOMY     TRACHEOSTOMY TUBE PLACEMENT N/A 03/04/2024   Procedure: CREATION, TRACHEOSTOMY;  Surgeon: [name] Massie RAMAN, MD;  Location: [facility] ORS;  Service: ENT;  Laterality: N/A;    Current Medications: Current Facility-Administered Medications  Medication Dose Route Frequency Provider Last Rate Last Admin   acetaminophen  (TYLENOL ) tablet 650 mg  650 mg Oral Q6H PRN Hampton, Tracie B, NP   650 mg at 08/22/24 0139   alum & mag hydroxide-simeth (MAALOX/MYLANTA) 200-200-20 MG/5ML suspension 30 mL  30 mL Oral Q4H PRN Hampton, Tracie B, NP   30 mL at 08/05/24 0306   amiodarone  (PACERONE ) tablet 400 mg  400 mg Oral Daily [name], [name], MD   400 mg at 08/25/24 0855   amoxicillin -clavulanate (AUGMENTIN ) 875-125 MG per tablet 1 tablet  1 tablet Oral Q12H Cherilyn Sautter, MD   1 tablet at 08/25/24 0854   ARIPiprazole  ER (ABILIFY  MAINTENA) injection 400 mg  400 mg Intramuscular Q28 days [name], [name], MD   400 mg at 08/24/24 9065   benzocaine  (ORAJEL) 10 % mucosal gel   Mouth/Throat TID Geena Weinhold, MD   1 Application at 08/25/24 1019   divalproex  (DEPAKOTE  ER) 24 hr tablet 1,000 mg  1,000 mg Oral QHS [name], [name], MD   1,000 mg at 08/24/24 2151   docusate sodium  (COLACE) capsule 100  mg  100 mg Oral Daily [name], [name], MD   100 mg at 08/25/24 0854   feeding supplement (ENSURE PLUS HIGH PROTEIN) liquid 237 mL  237 mL Oral TID BM [name], [name], MD   237 mL at 08/25/24 1019   hydrOXYzine  (ATARAX ) tablet 25 mg  25 mg Oral TID PRN Hampton, Tracie B, NP   25 mg at 08/18/24 0224   magnesium  hydroxide (MILK OF MAGNESIA) suspension 30 mL  30 mL Oral Daily PRN Hampton, Tracie B, NP   30 mL at 08/08/24 2233   memantine  (NAMENDA ) tablet 10 mg  10 mg Oral Daily [name], [name], MD   10 mg at 08/25/24 9144   metoprolol  tartrate (LOPRESSOR ) tablet 12.5 mg  12.5 mg Oral BID Talley Casco, MD   12.5 mg at 08/25/24 0851   naproxen  (NAPROSYN ) tablet 250 mg  250 mg Oral BID  WC Donnelly Mellow, MD   250 mg at 08/25/24 9145   OLANZapine  (ZYPREXA ) injection 5 mg  5 mg Intramuscular TID PRN Hampton, Tracie B, NP       OLANZapine  zydis (ZYPREXA ) disintegrating tablet 5 mg  5 mg Oral TID PRN Hampton, Tracie B, NP   5 mg at 08/04/24 1129   oxyCODONE  (Oxy IR/ROXICODONE ) immediate release tablet 5 mg  5 mg Oral Q4H PRN Dorinda Homans T, MD   5 mg at 08/12/24 0451   pantoprazole  (PROTONIX ) EC tablet 40 mg  40 mg Oral Daily [name], [name], MD   40 mg at 08/25/24 0856   sodium chloride  (OCEAN) 0.65 % nasal spray 1 spray  1 spray Each Nare PRN Donnelly Mellow, MD   1 spray at 08/18/24 1459   traZODone  (DESYREL ) tablet 50 mg  50 mg Oral QHS PRN Hampton, Tracie B, NP   50 mg at 08/24/24 2151    Lab Results:  No results found for this or any previous visit (from the past 48 hours).    Blood Alcohol level:  Lab Results  Component Value Date   [facility] <15 07/17/2024   ETH <15 02/20/2024    Metabolic Disorder Labs: Lab Results  Component Value Date   HGBA1C 5.4 08/12/2024   MPG 108.28 08/12/2024   MPG 119.76 07/14/2018   No results found for: PROLACTIN Lab Results  Component Value Date   CHOL 139 08/12/2024   TRIG 73 08/12/2024   HDL 52 08/12/2024   CHOLHDL 2.7 08/12/2024   VLDL 15  08/12/2024   LDLCALC 72 08/12/2024   LDLCALC 119 (H) 07/13/2018    Physical Findings: AIMS:  , ,  ,  ,    CIWA:    COWS:      Psychiatric Specialty Exam:  Presentation  General Appearance: Appropriate for Environment  Eye Contact:Fleeting  Speech:Normal Rate  Speech Volume:Decreased    Mood and Affect  Mood:Anxious  Affect:Flat   Thought Process  Thought Processes:circumstantial Orientation:Partial  Thought Content:discharge focused  Hallucinations:denies  Ideas of Reference:Delusions At baseline Suicidal Thoughts:denies  Homicidal Thoughts:denies   Sensorium  Memory:Immediate Poor; Recent Poor; Remote Poor  Judgment:Impaired  Insight:None   Executive Functions  Concentration:Poor  Attention Span:Poor  Recall:Poor  Fund of Knowledge:Poor  Language:Fair   Psychomotor Activity  Psychomotor Activity:No data recorded  Musculoskeletal: Strength & Muscle Tone: within normal limits Gait & Station: normal Assets  Assets:Communication Skills; Desire for Improvement; Social Support    Physical Exam: Physical Exam Vitals and nursing note reviewed.    ROS Blood pressure 122/80, pulse 74, temperature (!) 97.2 F (36.2 C), resp. rate 18, height 6' 1 (1.854 m), weight 81.2 kg, SpO2 95%. Body mass index is 23.62 kg/m.  Diagnosis: Active Problems:   Schizophrenia, paranoid (HCC)   Dementia with behavioral disturbance (HCC)   PLAN: Safety and Monitoring:  -- Involuntary admission to inpatient psychiatric unit for safety, stabilization and treatment  -- Daily contact with patient to assess and evaluate symptoms and progress in treatment  -- Patient's case to be discussed in multi-disciplinary team meeting  -- Observation Level : q15 minute checks  -- Vital signs:  q12 hours  -- Precautions: suicide, elopement, and assault -- Encouraged patient to participate in unit milieu and in scheduled group therapies  2. Psychiatric Treatment:   Scheduled Medications:  Discontinued oral Abilify  LAY Abilify  Maintena 400 mg q4 weeks given on 07/27/24, 08/24/2024  Cogentin  1mg  one dose given for EPS Depakote  ER increased to  1000 mg to help with mood stabilization as he was receiving multiple as needed antipsychotics for agitation  -- The risks/benefits/side-effects/alternatives to this medication were discussed in detail with the patient and time was given for questions. The patient consents to medication trial.  3. Medical Issues Being Addressed:  [facility] consulted for wound care management   Assessment and Plan:   Sacral Decubitus Ulcer Full thickness decubitus ulcer in setting of recent extended hospitalization, discharged from [facility] Nov 14th Wound looks pretty good overall WOC on board we appreciate input   Cognitive Deficit Concern for anoxic brain injury at discharge 02/2024 Delirium precautions   Peripheral Vascular Disease Legs with features c/w peripheral vascular disease, diminished DP pulses Would benefit from outpatient ABI's   HFrEF Euvolemic at this time Continue home medications   Hx Atrial Flutter Not on any anticoagulation at this time EKG with NSR Continue amiodarone   Hypertension Noted, adjust meds gradually after med rec complete   Tooth pain/Infection - Added: Augmentin  along with Orajel BP -better today no longer hypotensive-Will continue reduced dose of metoprolol  12.5 bid.   4. Discharge Planning:   -- Social work and case management to assist with discharge planning and identification of hospital follow-up needs prior to discharge  -- Estimated LOS: 3-4 days  Desmond Chimera, MD 08/25/2024, 12:36 PM

## 2024-08-25 NOTE — Progress Notes (Signed)
 Behavior:  Mild confusion.  Pleasant and cooperative.   Psych assessment:  Denies SI/HI and AVH.    Interaction / Group attendance:  Present in the milieu.  Appropriate interaction with peers and staff.  Attends groups.  Medication/ PRNs: Compliant.  Pain:  Denies  15 min checks in place for safety.    Wound care orders changed from Vashe to saline.

## 2024-08-25 NOTE — Consult Note (Addendum)
 WOC Nurse wound follow up: Refer to previous WOC consult note from 12/8. Re-consult performed remotely today after review of progress notes and photos in the EMR; contacted by the bedside nurse via Secure chat that the patient is experiencing itching from the dressing changes.  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   I will discontinue the use of Vashe wound cleanser and packing, since this might be contributing to the itching.  Topical treatment orders provided for bedside nurses to perform as follows: Change sacrum dressing Q day as follows: Pack wound with saline moistened gauze, using a swab to fill the area, then cover with foam dressing. Soila # [phone]).  It is OK for the patient to shower without a dressing in place, then reapply.  Please re-consult if further assistance is needed.  Thank-you,  Stephane Fought MSN, RN, CWOCN, CWCN-AP, CNS Contact Mon-Fri 0700-1500: [phone]

## 2024-08-25 NOTE — Group Note (Signed)
 Date:  08/25/2024 Time:  10:46 AM  Group Topic/Focus:   Effective coping skills for seniors involve a mix of mental, physical, and social activities, like mindfulness, light exercise (walking, yoga), creative outlets (art, music, writing), maintaining routines, and fostering social connections, which all reduce stress, combat loneliness, boost mood, and build resilience for life's changes. Deep breathing and journaling also offer immediate calm, while therapy can provide professional support.   Participation Level:  Active  Participation Quality:  Appropriate  Affect:  Appropriate  Cognitive:  Appropriate  Insight: Appropriate  Engagement in Group:  Engaged  Modes of Intervention:  Discussion  Additional Comments:  N/A  [name] 08/25/2024, 10:46 AM

## 2024-08-25 NOTE — Group Note (Signed)
 LCSW Group Therapy Note   Group Date: 08/25/2024 Start Time: 1330 End Time: 1400   Type of Therapy and Topic:  Group Therapy: Challenging Core Beliefs  Participation Level:  Minimal  Description of Group:  Patients were educated about core beliefs and asked to identify one harmful core belief that they have. Patients were asked to explore from where those beliefs originate. Patients were asked to discuss how those beliefs make them feel and the resulting behaviors of those beliefs. They were then be asked if those beliefs are true and, if so, what evidence they have to support them. Lastly, group members were challenged to replace those negative core beliefs with helpful beliefs.   Therapeutic Goals:   1. Patient will identify harmful core beliefs and explore the origins of such beliefs. 2. Patient will identify feelings and behaviors that result from those core beliefs. 3. Patient will discuss whether such beliefs are true. 4.  Patient will replace harmful core beliefs with helpful ones.  Summary of Patient Progress:  Miguel [name] actively engaged in processing and exploring how core beliefs are formed and how they impact thoughts, feelings, and behaviors. Patient proved open to input from peers and feedback from CSW. Patient demonstrated no insight into the subject matter, was respectful and supportive of peers, and participated throughout the entire session. Pt was off subject throughout the group and difficult to redirect  Therapeutic Modalities: Cognitive Behavioral Therapy; Solution-Focused Therapy   Gustaf Mccarter D Clint Strupp, [place] 08/25/2024  1:59 PM

## 2024-08-25 NOTE — Plan of Care (Signed)
" °  Problem: Activity: Goal: Interest or engagement in activities will improve Outcome: Progressing   Problem: Activity: Goal: Interest or engagement in activities will improve Outcome: Progressing   Problem: Coping: Goal: Ability to demonstrate self-control will improve Outcome: Progressing   Problem: Health Behavior/Discharge Planning: Goal: Compliance with treatment plan for underlying cause of condition will improve Outcome: Progressing   "

## 2024-08-25 NOTE — Group Note (Signed)
 Recreation Therapy Group Note   Group Topic:Health and Wellness  Group Date: 08/25/2024 Start Time: 1425 End Time: 1445 Facilitators: Celestia Jeoffrey BRAVO, LRT, CTRS Location: Craft Room  Group Description: Seated Exercise. LRT discussed the mental and physical benefits of exercise. LRT and group discussed how physical activity can be used as a coping skill. Pt's and LRT followed along to an exercise video on the TV screen that provided a visual representation and audio description of every exercise performed. Pt's encouraged to listen to their bodies and stop at any time if they experience feelings of discomfort or pain. Pts were encouraged to drink water  and stay hydrated.   Goal Area(s) Addressed:  Patient will learn benefits of physical activity. Patient will identify exercise as a coping skill.  Patient will follow multistep directions. Patient will try a new leisure interest.    Affect/Mood: N/A   Participation Level: Did not attend    Clinical Observations/Individualized Feedback: Patient did not attend.  Plan: Continue to engage patient in RT group sessions 2-3x/week.   Jeoffrey BRAVO Celestia, LRT, CTRS 08/25/2024 4:04 PM

## 2024-08-26 MED ORDER — BENZOCAINE 10 % MT GEL: Freq: Three times a day (TID) | OROMUCOSAL | Status: AC | PRN

## 2024-08-26 NOTE — BHH Group Notes (Signed)
 Spirituality Group   Description: Participant directed exploration of values, beliefs and meaning   **Group focused on setting intentions for the year ahead: What do we wish to leave behind/what do we claim going forward   Following a brief framework of chaplains role and ground rules of group behavior, participants are invited to share concerns or questions that engage spiritual life. Emphasis placed on common themes and shared experiences and ways to make meaning and clarify living into ones values.   Theory/Process/Goal: Utilize the theoretical framework of group therapy established by Celena Kite, Relational Cultural Theory and Rogerian approaches to facilitate relational empathy and use of the here and now to foster reflection, self-awareness, and sharing.   Observations: Marlin was an active participant in the group discussion. Engaged peers thoughtfully; engaged in meaning making and reflection on his perceived progress which he attributes to spiritual outlook.  Duc Crocket L. Delores HERO.Div

## 2024-08-26 NOTE — Progress Notes (Signed)
" °   08/26/24 1515  Spiritual Encounters  Type of Visit Follow up  Care provided to: Patient  Referral source Patient request  Reason for visit Routine spiritual support  OnCall Visit No   I briefly provided one on one spiritual care support for Mr Miguel [name] at his request following spirituality group time.  [name] shared that the group time had been meaningful for him. He built on comments made during group that he feels his faith has grown and he senses growth in himself and a sense of peace. He expressed gratitude.  I affirmed [name]'s presence and positive spiritual growth. I expressed my gratitude for what he shares and how meaningful that is to me. I affirmed the importance of building relationships and how that is also a part of what we gain. I wished him rest and continued healing.  [name] L. Delores HERO.Div "

## 2024-08-26 NOTE — Group Note (Signed)
 Date:  08/26/2024 Time:  10:41 AM  Group Topic/Focus:    For geriatric patients, combining gentle, low-impact exercises like walking, chair yoga, or Tai Chi with mindfulness and meditation (breathing, body scans) significantly boosts physical (balance, strength) and mental (less anxiety/depression, better focus) health, improving overall well-being by managing chronic pain and enhancing emotional resilience. Adaptations are key: use chairs for stability, focus on simple guided sessions, and prioritize comfort for balance, mobility, and sensory issues.  Meditation is a simple yet powerful way to support your emotional and physical well-being. Regular practice can help lower blood pressure, ease anxiety, and improve focus and sleep. From deep breathing and body scans to guided imagery and mindfulness, there are many styles to explore.  Participation Level:  Did Not Attend    Miguel [name] 08/26/2024, 10:41 AM

## 2024-08-26 NOTE — Progress Notes (Signed)
" °   08/25/24 2000  Psych Admission Type (Psych Patients Only)  Admission Status Involuntary  Psychosocial Assessment  Patient Complaints None  Eye Contact Brief  Facial Expression Animated  Affect Appropriate to circumstance  Speech Soft  Interaction Assertive  Motor Activity Slow  Appearance/Hygiene In scrubs  Behavior Characteristics Cooperative  Mood Preoccupied  Thought Process  Coherency Tangential  Content Preoccupation  Delusions None reported or observed  Perception WDL  Hallucination None reported or observed  Judgment Poor  Confusion Mild  Danger to Self  Current suicidal ideation? Denies  Agreement Not to Harm Self Yes  Description of Agreement verbal  Danger to Others  Danger to Others None reported or observed   Mood/Behavior:  Pleasant and cooperative. Pt disoriented to situation and up a few times at the nurses station overnight. Pt noted to fall asleep while sitting up in chair. Pt encouraged to go lay down in bed. Pt noted to have one incontinence episode overnight.    Psych assessment: Denies SI/HI and AVH.     Interaction / Group attendance:  Present in the milieu. Interacting with peers and staff.  Attended group.   Medication/ PRNs: Compliant with scheduled medications. Required PRNs Trazodone  for sleep and noted decrease effect. Required Atarax  for anxiety and restlessness decrease effect.    Pain: Denies    15 min checks in place for safety. "

## 2024-08-26 NOTE — Plan of Care (Signed)
  Problem: Education: Goal: Knowledge of [place] [facility] Education information/materials will improve Outcome: Progressing   Problem: Activity: Goal: Interest or engagement in activities will improve Outcome: Progressing   Problem: Coping: Goal: Ability to verbalize frustrations and anger appropriately will improve Outcome: Progressing   Problem: Education: Goal: Knowledge of  General Education information/materials will improve Outcome: Progressing Goal: Emotional status will improve Outcome: Progressing Goal: Mental status will improve Outcome: Progressing Goal: Verbalization of understanding the information provided will improve Outcome: Progressing   Problem: Activity: Goal: Interest or engagement in activities will improve Outcome: Progressing Goal: Sleeping patterns will improve Outcome: Progressing   Problem: Coping: Goal: Ability to verbalize frustrations and anger appropriately will improve Outcome: Progressing Goal: Ability to demonstrate self-control will improve Outcome: Progressing   Problem: Health Behavior/Discharge Planning: Goal: Identification of resources available to assist in meeting health care needs will improve Outcome: Progressing Goal: Compliance with treatment plan for underlying cause of condition will improve Outcome: Progressing   Problem: Physical Regulation: Goal: Ability to maintain clinical measurements within normal limits will improve Outcome: Progressing   Problem: Safety: Goal: Periods of time without injury will increase Outcome: Progressing   Problem: Coping: Goal: Ability to adjust to condition or change in health will improve Outcome: Progressing   Problem: Health Behavior/Discharge Planning: Goal: Ability to identify and utilize available resources and services will improve Outcome: Progressing

## 2024-08-26 NOTE — Group Note (Signed)
 Date:  08/26/2024 Time:  3:39 PM  Group Topic/Focus:  Coping With Mental Health Crisis:   The purpose of this group is to help patients identify strategies for coping with mental health crisis.  Group discusses possible causes of crisis and ways to manage them effectively.    Participation Level:  Did Not Attend  [name] 08/26/2024, 3:39 PM

## 2024-08-26 NOTE — BH IP Treatment Plan (Signed)
 Interdisciplinary Treatment and Diagnostic Plan Update  08/26/2024 Time of Session: 3:00 PM  Miguel [name] MRN: [id number]  Principal Diagnosis: <principal problem not specified>  Secondary Diagnoses: Active Problems:   Schizophrenia, paranoid (HCC)   Dementia with behavioral disturbance (HCC)   Current Medications:  Current Facility-Administered Medications  Medication Dose Route Frequency Provider Last Rate Last Admin   acetaminophen  (TYLENOL ) tablet 650 mg  650 mg Oral Q6H PRN Hampton, Tracie B, NP   650 mg at 08/22/24 0139   alum & mag hydroxide-simeth (MAALOX/MYLANTA) 200-200-20 MG/5ML suspension 30 mL  30 mL Oral Q4H PRN Hampton, Tracie B, NP   30 mL at 08/05/24 0306   amiodarone  (PACERONE ) tablet 400 mg  400 mg Oral Daily [name], [name], MD   400 mg at 08/26/24 9164   amoxicillin -clavulanate (AUGMENTIN ) 875-125 MG per tablet 1 tablet  1 tablet Oral Q12H [name], [name], MD   1 tablet at 08/26/24 0836   ARIPiprazole  ER (ABILIFY  MAINTENA) injection 400 mg  400 mg Intramuscular Q28 days [name], [name], MD   400 mg at 08/24/24 9065   benzocaine  (ORAJEL) 10 % mucosal gel   Mouth/Throat TID [name], [name], MD   Given at 08/25/24 2145   divalproex  (DEPAKOTE  ER) 24 hr tablet 1,000 mg  1,000 mg Oral QHS [name], [name], MD   1,000 mg at 08/25/24 2144   docusate sodium  (COLACE) capsule 100 mg  100 mg Oral Daily [name], [name], MD   100 mg at 08/26/24 0836   feeding supplement (ENSURE PLUS HIGH PROTEIN) liquid 237 mL  237 mL Oral TID BM [name], [name], MD   237 mL at 08/26/24 9097   hydrOXYzine  (ATARAX ) tablet 25 mg  25 mg Oral TID PRN Hampton, Tracie B, NP   25 mg at 08/26/24 0014   magnesium  hydroxide (MILK OF MAGNESIA) suspension 30 mL  30 mL Oral Daily PRN Hampton, Tracie B, NP   30 mL at 08/08/24 2233   memantine  (NAMENDA ) tablet 10 mg  10 mg Oral Daily [name], [name], MD   10 mg at 08/26/24 0840   metoprolol  tartrate (LOPRESSOR ) tablet 12.5 mg  12.5 mg Oral BID  [name], [name], MD   12.5 mg at 08/26/24 9165   naproxen  (NAPROSYN ) tablet 250 mg  250 mg Oral BID WC [name], [name], MD   250 mg at 08/26/24 9162   OLANZapine  (ZYPREXA ) injection 5 mg  5 mg Intramuscular TID PRN Hampton, Tracie B, NP       OLANZapine  zydis (ZYPREXA ) disintegrating tablet 5 mg  5 mg Oral TID PRN Hampton, Tracie B, NP   5 mg at 08/04/24 1129   oxyCODONE  (Oxy IR/ROXICODONE ) immediate release tablet 5 mg  5 mg Oral Q4H PRN Miguel Drue DASEN, MD   5 mg at 08/12/24 0451   pantoprazole  (PROTONIX ) EC tablet 40 mg  40 mg Oral Daily [name], [name], MD   40 mg at 08/26/24 9164   sodium chloride  (OCEAN) 0.65 % nasal spray 1 spray  1 spray Each Nare PRN [name], [name], MD   1 spray at 08/18/24 1459   traZODone  (DESYREL ) tablet 50 mg  50 mg Oral QHS PRN Hampton, Tracie B, NP   50 mg at 08/25/24 2144   PTA Medications: Medications Prior to Admission  Medication Sig Dispense Refill Last Dose/Taking   amiodarone  (PACERONE ) 400 MG tablet Place 1 tablet (400 mg total) into feeding tube daily.      ARIPiprazole  (ABILIFY ) 30 MG tablet Place 1 tablet (30 mg  total) into feeding tube at bedtime.      bisacodyl  (DULCOLAX) 10 MG suppository Place 1 suppository (10 mg total) rectally daily as needed for moderate constipation or severe constipation.      busPIRone  (BUSPAR ) 5 MG tablet Take 5 mg by mouth 3 (three) times daily.      Chlorhexidine  Gluconate Cloth 2 % PADS Apply 6 each topically daily.      digoxin  (LANOXIN ) 0.125 MG tablet 1 tablet (0.125 mg total) by Per NG tube route daily. (Patient taking differently: Take 0.125 mg by mouth every other day.)      docusate (COLACE) 50 MG/5ML liquid Place 10 mLs (100 mg total) into feeding tube 2 (two) times daily.      metoprolol  tartrate (LOPRESSOR ) 25 MG tablet Take 25 mg by mouth 2 (two) times daily.      pantoprazole  (PROTONIX ) 40 MG tablet Take 40 mg by mouth daily.      traMADol  (ULTRAM ) 50 MG tablet Take 50 mg by mouth 2 (two) times daily.        Patient Stressors: Educational concerns   Health problems   Medication change or noncompliance   Other: Stated transitional housing concerns    Patient Strengths: Manufacturing systems engineer  Religious Affiliation   Treatment Modalities: Medication Management, Group therapy, Case management,  1 to 1 session with clinician, Psychoeducation, Recreational therapy.   Physician Treatment Plan for Primary Diagnosis: <principal problem not specified> Long Term Goal(s):     Short Term Goals:    Medication Management: Evaluate patient's response, side effects, and tolerance of medication regimen.  Therapeutic Interventions: 1 to 1 sessions, Unit Group sessions and Medication administration.  Evaluation of Outcomes: Not Progressing  Physician Treatment Plan for Secondary Diagnosis: Active Problems:   Schizophrenia, paranoid (HCC)   Dementia with behavioral disturbance (HCC)  Long Term Goal(s):     Short Term Goals:       Medication Management: Evaluate patient's response, side effects, and tolerance of medication regimen.  Therapeutic Interventions: 1 to 1 sessions, Unit Group sessions and Medication administration.  Evaluation of Outcomes: Not Progressing   RN Treatment Plan for Primary Diagnosis: <principal problem not specified> Long Term Goal(s): Knowledge of disease and therapeutic regimen to maintain health will improve  Short Term Goals: Ability to remain free from injury will improve, Ability to verbalize frustration and anger appropriately will improve, Ability to demonstrate self-control, Ability to participate in decision making will improve, Ability to verbalize feelings will improve, Ability to disclose and discuss suicidal ideas, Ability to identify and develop effective coping behaviors will improve, and Compliance with prescribed medications will improve  Medication Management: RN will administer medications as ordered by provider, will assess and evaluate patient's  response and provide education to patient for prescribed medication. RN will report any adverse and/or side effects to prescribing provider.  Therapeutic Interventions: 1 on 1 counseling sessions, Psychoeducation, Medication administration, Evaluate responses to treatment, Monitor vital signs and CBGs as ordered, Perform/monitor CIWA, COWS, AIMS and Fall Risk screenings as ordered, Perform wound care treatments as ordered.  Evaluation of Outcomes: Not Progressing   LCSW Treatment Plan for Primary Diagnosis: <principal problem not specified> Long Term Goal(s): Safe transition to appropriate next level of care at discharge, Engage patient in therapeutic group addressing interpersonal concerns.  Short Term Goals: Engage patient in aftercare planning with referrals and resources, Increase social support, Increase ability to appropriately verbalize feelings, Increase emotional regulation, Facilitate acceptance of mental health diagnosis and concerns, Facilitate patient progression through  stages of change regarding substance use diagnoses and concerns, Identify triggers associated with mental health/substance abuse issues, and Increase skills for wellness and recovery  Therapeutic Interventions: Assess for all discharge needs, 1 to 1 time with Social worker, Explore available resources and support systems, Assess for adequacy in community support network, Educate family and significant other(s) on suicide prevention, Complete Psychosocial Assessment, Interpersonal group therapy.  Evaluation of Outcomes: Not Progressing   Progress in Treatment: Attending groups: Yes and No. Participating in groups: Yes and No. Taking medication as prescribed: Yes. Toleration medication: Yes. Family/Significant other contact made: Yes, individual(s) contacted:  SPE completed with the patient's family.  Patient understands diagnosis: No. Discussing patient identified problems/goals with staff: Yes. Medical problems  stabilized or resolved: Yes. Denies suicidal/homicidal ideation: Yes. Issues/concerns per patient self-inventory: No. Other: none   New problem(s) identified: New problem(s) identified: No, Describe:  None Update 07/26/24: No changes at this time Update 08/01/24: No changes at this time  Update 08/06/24: No changes at this time 08/11/24 Update: No changes at this time.  Update 08/16/24: No changes at this time. Update 12/262025:  No changes at this time. Update 08/26/2024:  No changes at this time.     New Short Term/Long Term Goal(s):detox, elimination of symptoms of psychosis, medication management for mood stabilization; elimination of SI thoughts; development of comprehensive mental wellness/sobriety plan.  Update 07/26/24: No changes at this time Update 08/01/24: No changes at this time Update 08/06/24: No changes at this time  10/13/23 Update: No changes at this time. Update 08/16/24: No changes at this time.  Update 12/262025:  No changes at this time. Update 08/26/2024:  No changes at this time.   Patient Goals:  Make sure everything is settled. Update 07/26/24: No changes at this time Update 08/01/24: No changes at this time Update 08/06/24: No changes at this time Update 08/06/24: No changes at this time 08/11/24 Update: No changes at this time. Update 08/16/24: No changes at this time.  Update 12/262025:  No changes at this time. Update 08/26/2024:  No changes at this time.   Discharge Plan or Barriers: CSW to assist with the development of appropriate discharge plan.  Update 07/26/24: No changes at this time Update 08/01/24: APS Report made for pt, screened it. Update 08/06/24: FL2 sent to [facility]. Pt is not placement. [facility] and APS to assist with placement. 08/11/24 Update: CSW to continue to support [facility] in working toward placement for patient. Update 08/16/24: No changes at this time. Update 12/262025:  No changes at this time. Update 08/26/2024:  No changes at this  time.   Reason for Continuation of Hospitalization: Aggression Anxiety Delusions  Depression Hallucinations Suicidal ideation   Estimated Length of Stay: 1-7 days. Update 07/26/24: TBD Update 08/01/24: TBD Update 08/06/24: TBD 08/11/24 Update: TBD.  Update 08/16/24: TBD  Update 12/262025:  TBD Update 08/26/2024:  TBD  Last 3 [place] Suicide Severity Risk Score: Flowsheet Row Admission (Current) from 07/18/2024 in [facility] from 07/17/2024 in [facility] Department at [facility] ED to Hosp-Admission (Discharged) from 02/20/2024 in [facility] ICU[facility]CCU  C-SSRS RISK CATEGORY No Risk No Risk No Risk    Last PHQ 2/9 Scores:     No data to display          Scribe for Treatment Team: [name], [place] 08/26/2024 3:41 PM

## 2024-08-26 NOTE — Progress Notes (Signed)
 Patient is an involuntary admission to [facility] with APS involvement and awaiting placement.  Patient has a stage 3 decub to his sacrum which was cleansed and dressed today as ordered. Previous dressing was not on and he states it fell off. Patient denies SI, HI, AVH, anxiety and depression.  Patient is confused and repeats himself often but he is pleasant and cooperative. Patient is currently on augmentin  d/t mouth pain.  Also on prn oragel which he has declined today. Will continue to monitor.

## 2024-08-27 NOTE — Progress Notes (Addendum)
 Patient refused AM sacrum dressing change x3, will continue to encourage compliance.   08/26/24 2311  Psych Admission Type (Psych Patients Only)  Admission Status Involuntary  Psychosocial Assessment  Patient Complaints None  Eye Contact Brief  Facial Expression Animated  Affect Appropriate to circumstance  Speech Soft;Incoherent;Tangential  Interaction Assertive  Motor Activity Slow  Appearance/Hygiene In scrubs  Behavior Characteristics Cooperative  Mood Preoccupied  Thought Process  Coherency Tangential  Content Preoccupation  Delusions None reported or observed  Perception WDL  Hallucination None reported or observed  Judgment Poor  Confusion Mild  Danger to Self  Current suicidal ideation? Denies  Agreement Not to Harm Self Yes  Description of Agreement verbal  Danger to Others  Danger to Others None reported or observed    Estimated Sleeping Duration (Last 24 Hours): 8.50-10.25 hours

## 2024-08-27 NOTE — Plan of Care (Signed)
  Problem: Activity: Goal: Interest or engagement in activities will improve Outcome: Progressing   Problem: Education: Goal: Emotional status will improve Outcome: Progressing

## 2024-08-27 NOTE — Plan of Care (Signed)
" °  Problem: Coping: Goal: Ability to demonstrate self-control will improve Outcome: Progressing   Problem: Safety: Goal: Periods of time without injury will increase Outcome: Progressing   Problem: Health Behavior/Discharge Planning: Goal: Identification of resources available to assist in meeting health care needs will improve Outcome: Not Progressing Goal: Compliance with treatment plan for underlying cause of condition will improve Outcome: Not Progressing   "

## 2024-08-27 NOTE — Group Note (Signed)
 Recreation Therapy Group Note   Group Topic:Goal Setting  Group Date: 08/27/2024 Start Time: 1330 End Time: 1405 Facilitators: Celestia Jeoffrey BRAVO, LRT, CTRS Location: Dayroom  Group Description: New Years Resolutions. Patients received a handout for them to write down their new years resolutions on it. The handout had a decorative border around the paper that patients could also color once they're finished writing their new year resolutions down. Afterwards, LRT encouraged patients to read out loud their new years resolutions to the group.   Goal Area(s) Addressed: Patient will increase communication,  Patient will identify a goal leading into the new year.  Patient will foster mutual support. Patient will express themselves through art.    Affect/Mood: Flat   Participation Level: Minimal    Clinical Observations/Individualized Feedback: Miguel [name] was present in the dayroom at the time of group. Pt was in and out of sleep throughout. Pt wrote multiple words in cursive on his handout but appeared to be word salad.   Plan: Continue to engage patient in RT group sessions 2-3x/week.   Jeoffrey BRAVO Celestia, LRT, CTRS 08/27/2024 4:26 PM

## 2024-08-27 NOTE — Progress Notes (Signed)
 Patient accepted medications without issue. Ate well with ensure supplements. Wound care. Patient present in the dayroom most of the day. Observed talking to peers and participating in group. LBM today.   08/27/24 1300  Psych Admission Type (Psych Patients Only)  Admission Status Involuntary  Psychosocial Assessment  Patient Complaints None  Eye Contact Brief  Facial Expression Animated  Affect Appropriate to circumstance  Speech Soft  Interaction Assertive  Motor Activity Slow  Appearance/Hygiene Unremarkable  Behavior Characteristics Cooperative  Mood Pleasant  Thought Process  Coherency Tangential  Content Preoccupation  Delusions None reported or observed  Perception WDL  Hallucination None reported or observed  Judgment Poor  Confusion Mild  Danger to Self  Current suicidal ideation? Denies  Danger to Others  Danger to Others None reported or observed

## 2024-08-27 NOTE — Group Note (Signed)
 [facility] LCSW Group Therapy Note   Group Date: 08/27/2024 Start Time: 1300 End Time: 1330  Type of Therapy and Topic:  Group Therapy:  Feelings around Relapse and Recovery  Participation Level:  None   Description of Group:    Patients in this group will discuss emotions they experience before and after a relapse. They will process how experiencing these feelings, or avoidance of experiencing them, relates to having a relapse. Facilitator will guide patients to explore emotions they have related to recovery. Patients will be encouraged to process which emotions are more powerful. They will be guided to discuss the emotional reaction significant others in their lives may have to patients relapse or recovery. Patients will be assisted in exploring ways to respond to the emotions of others without this contributing to a relapse.  Therapeutic Goals: Patient will identify two or more emotions that lead to relapse for them:  Patient will identify two emotions that result when they relapse:  Patient will identify two emotions related to recovery:  Patient will demonstrate ability to communicate their needs through discussion and/or role plays.   Summary of Patient Progress: Patient was present for the entirety of group. He did not participate in the activity and slept through the majority of group.   Therapeutic Modalities:   Cognitive Behavioral Therapy Solution-Focused Therapy Assertiveness Training Relapse Prevention Therapy   Nadara JONELLE Fam, LCSW

## 2024-08-27 NOTE — Group Note (Signed)
 Date:  08/27/2024 Time:  11:05 AM  Group Topic/Focus:  Making Healthy Choices:   The focus of this group is to help patients identify negative/unhealthy choices they were using prior to admission and identify positive/healthier coping strategies to replace them upon discharge.    Participation Level:  Did Not Attend   Maglione,Chimamanda Siegfried E 08/27/2024, 11:05 AM

## 2024-08-27 NOTE — Group Note (Signed)
 Date:  08/27/2024 Time:  3:44 PM  Group Topic/Focus:  Goals Group:   The focus of this group is to help patients establish daily goals to achieve during treatment and discuss how the patient can incorporate goal setting into their daily lives to aide in recovery.    Participation Level:  Active  Participation Quality:  Appropriate  Affect:  Appropriate  Cognitive:  Appropriate  Insight: Appropriate  Engagement in Group:  Engaged  Modes of Intervention:  Discussion   Arland Nutting 08/27/2024, 3:44 PM

## 2024-08-27 NOTE — Group Note (Signed)
 Date:  08/27/2024 Time:  8:33 PM  Group Topic/Focus:  Wrap-Up Group:   The focus of this group is to help patients review their daily goal of treatment and discuss progress on daily workbooks.    Participation Level:  Active  Participation Quality:  Appropriate  Affect:  Appropriate  Cognitive:  Appropriate  Insight: Appropriate  Engagement in Group:  Engaged  Modes of Intervention:  Discussion  Additional Comments:    Miguel [name] 08/27/2024, 8:33 PM

## 2024-08-28 NOTE — Group Note (Signed)
 Date:  08/28/2024 Time:  3:40 PM  Group Topic/Focus:  Coping With Mental Health Crisis:   The purpose of this group is to help patients identify strategies for coping with mental health crisis.  Group discusses possible causes of crisis and ways to manage them effectively.    Participation Level:  Did Not Attend    [name] 08/28/2024, 3:40 PM

## 2024-08-28 NOTE — Progress Notes (Signed)
" °   08/27/24 2341  Psych Admission Type (Psych Patients Only)  Admission Status Involuntary  Psychosocial Assessment  Patient Complaints None  Eye Contact Brief  Facial Expression Animated  Affect Appropriate to circumstance  Speech Soft  Interaction Assertive  Motor Activity Slow  Appearance/Hygiene Unremarkable  Behavior Characteristics Cooperative  Mood Pleasant  Thought Process  Coherency Tangential  Content Preoccupation  Delusions None reported or observed  Perception WDL  Hallucination None reported or observed  Judgment Poor  Confusion Mild  Danger to Self  Current suicidal ideation? Denies  Agreement Not to Harm Self Yes  Description of Agreement verbal  Danger to Others  Danger to Others None reported or observed    Estimated Sleeping Duration (Last 24 Hours): 4.00-5.00 hours  "

## 2024-08-28 NOTE — Group Note (Signed)
 Date:  08/28/2024 Time:  8:30 PM  Group Topic/Focus:  Healthy Communication:   The focus of this group is to discuss communication, barriers to communication, as well as healthy ways to communicate with others.    Participation Level:  Active  Participation Quality:  Appropriate  Affect:  Appropriate  Cognitive:  Appropriate  Insight: Good  Engagement in Group:  Engaged  Modes of Intervention:  Discussion  Additional Comments:    Romero Earnie Hope 08/28/2024, 8:30 PM

## 2024-08-28 NOTE — Group Note (Signed)
 Date:  08/28/2024 Time:  12:15 PM  Group Topic/Focus:  Managing Feelings:   The focus of this group is to identify what feelings patients have difficulty handling and develop a plan to handle them in a healthier way upon discharge.    Participation Level:  Minimal  Participation Quality:  Appropriate and Intrusive  Affect:  Appropriate and Not Congruent  Cognitive:  Alert, Confused, and Delusional  Insight: Limited  Engagement in Group:  Engaged and Limited  Modes of Intervention:  Activity and Discussion  Additional Comments:     Maglione,Martita Brumm E 08/28/2024, 12:15 PM

## 2024-08-28 NOTE — Group Note (Signed)
 Recreation Therapy Group Note   Group Topic:Emotion Expression  Group Date: 08/28/2024 Start Time: 1400 End Time: 1430 Facilitators: Celestia Jeoffrey BRAVO, LRT, CTRS Location: Courtyard  Group Description: Music. Patients are encouraged to name their favorite song(s) for LRT to play song through speaker for group to hear, while in the courtyard getting fresh air and sunlight. Patients educated on the definition of leisure and the importance of having different leisure interests outside of the hospital. Group discussed how leisure activities can often be used as pharmacologist and that listening to music and being outside are examples.    Goal Area(s) Addressed:  Patient will identify a current leisure interest.  Patient will practice making a positive decision. Patient will have the opportunity to try a new leisure activity.   Affect/Mood: N/A   Participation Level: Did not attend    Clinical Observations/Individualized Feedback: Patient did not attend.  Plan: Continue to engage patient in RT group sessions 2-3x/week.   Jeoffrey BRAVO Celestia, LRT, CTRS 08/28/2024 4:55 PM

## 2024-08-28 NOTE — Plan of Care (Signed)
  Problem: Activity: Goal: Sleeping patterns will improve Outcome: Progressing   Problem: Coping: Goal: Ability to demonstrate self-control will improve Outcome: Progressing   

## 2024-08-28 NOTE — Group Note (Signed)
 Physical/Occupational Therapy Group Note  Group Topic: Neurographic Art  Group Date: 08/28/2024 Start Time: 1305 End Time: 1355 Facilitators: Clive Warren CROME, OT   Group Description: Group participated with Neurographic art activity, using watercolor paints to facilitate creative expression and meditation/relaxation for each individual.  Incorporated bimanual coordination, mental focus, emotional processing, task/command following and relaxation techniques as appropriate.  Patients engaged socially with therapist and other group participants throughout session. Allowed to ask questions as appropriate, and encouraged to identify ways they could use/share their creations with themselves and others.  Therapeutic Goal(s):  Demonstrate ability to independently manipulate utensils required to participate with and complete activity. Demonstrate ability to cognitively focus on task and follow commands necessary for completion. Demonstrate use of art as an outlet for emotional processing and expression. Identify and demonstrate importance of relaxation, neural calming and meditation for improved participation with life groups.  Individual Participation: Pt present in day room at start of session, agreeable to remaining with encouragement. Pt engaged in activity but unable to follow any guided instructions. He demo'd difficulty with activity in part due to lack of specific instructions/structure and pt endorsed as much. Visual example provided but unhelpful. Pt focused on using 1 watercolor to paint with, seeking feedback from others at the table for what his painting looked like. A bit tangential at times but pleasant throughout and engaged in discussion with and without prompting.   Participation Level: Active   Participation Quality: Moderate Cues   Behavior: Alert, Distracted, and Interactive    Speech/Thought Process: Disorganized and Tangential    Affect/Mood: Appropriate and Stable    Insight:  Poor   Judgement: Poor   Modes of Intervention: Activity, Clarification, Discussion, Education, Exploration, Problem-solving, Rapport Building, Socialization, and Support  Patient Response to Interventions:  Engaged and Interested    Plan: Continue to engage patient in PT/OT groups 1 - 2x/week.  Bralynn Donado R., MPH, MS, OTR/L ascom [phone] 08/28/2024, 3:35 PM

## 2024-08-29 NOTE — Plan of Care (Signed)
" °  Problem: Education: Goal: Knowledge of [facility] Education information/materials will improve Outcome: Not Progressing   Problem: Activity: Goal: Interest or engagement in activities will improve Outcome: Not Progressing   Problem: Coping: Goal: Ability to verbalize frustrations and anger appropriately will improve Outcome: Not Progressing   Problem: Education: Goal: Knowledge of [facility] Education information/materials will improve Outcome: Not Progressing Goal: Emotional status will improve Outcome: Not Progressing Goal: Mental status will improve Outcome: Not Progressing Goal: Verbalization of understanding the information provided will improve Outcome: Not Progressing   Problem: Education: Goal: Emotional status will improve Outcome: Not Progressing   Problem: Activity: Goal: Interest or engagement in activities will improve Outcome: Not Progressing Goal: Sleeping patterns will improve Outcome: Not Progressing   Problem: Coping: Goal: Ability to verbalize frustrations and anger appropriately will improve Outcome: Not Progressing Goal: Ability to demonstrate self-control will improve Outcome: Not Progressing   Problem: Health Behavior/Discharge Planning: Goal: Identification of resources available to assist in meeting health care needs will improve Outcome: Not Progressing Goal: Compliance with treatment plan for underlying cause of condition will improve Outcome: Not Progressing   "

## 2024-08-29 NOTE — Plan of Care (Signed)
" °  Problem: Education: Goal: Knowledge of [facility] Education information/materials will improve Outcome: Not Progressing   Problem: Activity: Goal: Interest or engagement in activities will improve Outcome: Not Progressing   Problem: Coping: Goal: Ability to verbalize frustrations and anger appropriately will improve Outcome: Not Progressing   Problem: Education: Goal: Knowledge of [facility] Education information/materials will improve Outcome: Not Progressing   "

## 2024-08-29 NOTE — Progress Notes (Signed)
 Patient in dayroom much of the day. Observed talking with peers and participating in group. Accepted medications without issue. Denies SI/HI/AVH.  08/29/24 1100  Psych Admission Type (Psych Patients Only)  Admission Status Involuntary  Psychosocial Assessment  Patient Complaints None  Eye Contact Brief  Facial Expression Animated  Affect Appropriate to circumstance  Speech Soft  Interaction Assertive  Motor Activity Slow  Appearance/Hygiene Unremarkable;In scrubs  Behavior Characteristics Cooperative  Mood Pleasant  Thought Process  Coherency Tangential  Content Preoccupation  Delusions None reported or observed  Perception WDL  Hallucination None reported or observed  Judgment Poor  Confusion Mild  Danger to Self  Current suicidal ideation? Denies  Danger to Others  Danger to Others None reported or observed

## 2024-08-29 NOTE — Group Note (Signed)
 LCSW Group Therapy Note  Group Date: 08/29/2024 Start Time: 1032 End Time: 1110   Type of Therapy and Topic:  Group Therapy - Healthy vs Unhealthy Coping Skills  Participation Level:  Did Not Attend   Description of Group The focus of this group was to determine what unhealthy coping techniques typically are used by group members and what healthy coping techniques would be helpful in coping with various problems. Patients were guided in becoming aware of the differences between healthy and unhealthy coping techniques. Patients were asked to identify 2-3 healthy coping skills they would like to learn to use more effectively.  Therapeutic Goals Patients learned that coping is what human beings do all day long to deal with various situations in their lives Patients defined and discussed healthy vs unhealthy coping techniques Patients identified their preferred coping techniques and identified whether these were healthy or unhealthy Patients determined 2-3 healthy coping skills they would like to become more familiar with and use more often. Patients provided support and ideas to each other   Summary of Patient Progress:  Patient did not attend group.   Therapeutic Modalities Cognitive Behavioral Therapy Motivational Interviewing  Danaija Eskridge W Matthias Bogus, LCSWA 08/29/2024  12:13 PM

## 2024-08-29 NOTE — Group Note (Signed)
 Date:  08/29/2024 Time:  10:35 AM  Group Topic/Focus:  Coping With Mental Health Crisis:   The purpose of this group is to help patients identify strategies for coping with mental health crisis.  Group discusses possible causes of crisis and ways to manage them effectively.    Participation Level:  Did Not Attend    Miguel [name] Bias 08/29/2024, 10:35 AM

## 2024-08-30 DIAGNOSIS — F03918 Unspecified dementia, unspecified severity, with other behavioral disturbance: Secondary | ICD-10-CM | POA: Diagnosis not present

## 2024-08-30 DIAGNOSIS — F200 Paranoid schizophrenia: Secondary | ICD-10-CM | POA: Diagnosis not present

## 2024-08-30 NOTE — Group Note (Signed)
 Date:  08/30/2024 Time:  2:16 PM  Group Topic/Focus:  Goals Group:   The focus of this group is to help patients establish daily goals to achieve during treatment and discuss how the patient can incorporate goal setting into their daily lives to aide in recovery.    Participation Level:  Did Not Attend   Larrie Leita BRAVO 08/30/2024, 2:16 PM

## 2024-08-30 NOTE — Plan of Care (Signed)
" °  Problem: Coping: Goal: Ability to verbalize frustrations and anger appropriately will improve Outcome: Not Progressing   Problem: Education: Goal: Emotional status will improve Outcome: Not Progressing Goal: Mental status will improve Outcome: Not Progressing   Problem: Coping: Goal: Ability to verbalize frustrations and anger appropriately will improve Outcome: Not Progressing Goal: Ability to demonstrate self-control will improve Outcome: Not Progressing   "

## 2024-08-30 NOTE — Group Note (Signed)
 Date:  08/30/2024 Time:  3:53 PM  Group Topic/Focus:  Wellness Toolbox:   The focus of this group is to discuss various aspects of wellness, balancing those aspects and exploring ways to increase the ability to experience wellness.  Patients will create a wellness toolbox for use upon discharge.    Participation Level:  Did Not Attend  Larrie Leita BRAVO 08/30/2024, 3:53 PM

## 2024-08-30 NOTE — Progress Notes (Signed)
 [facility] MD Progress Note  08/30/2024 6:00 PM Miguel [name]  MRN:  [id number]  Miguel [name]. Miguel [name] is a 74 year old male with a past psychiatric history significant for schizophrenia who presents under involuntary commitment initiated by family due to medication non-adherence, increasing paranoia, and behavioral disorganization. 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 Brother reports multiple episodes of patient whispering through the night, stating people were talking about him and planning to harm him. Patient was advised by his ACT Team to come to the ED; he refused, prompting the family to pursue IVC.Patient is admitted to [facility] unit with Q15 min safety monitoring. Multidisciplinary team approach is offered. Medication management; group/milieu therapy is offered.  Subjective:  Chart reviewed, case discussed in multidisciplinary meeting, patient seen during rounds.  Per staff report patient blood pressure has been stable 122/80.  Having sleeping okay.  Reportedly last night he slept for 5 hours.  Patient on interview reports that he is doing well.  Denies any suicidal homicidal ideations or any auditory visual hallucinations.  Denied that he does not want to harm his brother.  Patient has received Abilify  injection.  Abilify  oral dose have been discontinued.  No concerns at this time.     Past Psychiatric History: see h&P Family History:  Family History  Family history unknown: Yes   Social History:  Social History   Substance and Sexual Activity  Alcohol Use No      Social History   Substance and Sexual Activity  Drug Use No    Social History   Socioeconomic History   Marital status: Divorced    Spouse name: Not on file   Number of children: Not on file   Years of education: Not on file   Highest education level: Not on file  Occupational History   Not on file  Tobacco Use   Smoking status: Every Day    Current packs/day: 0.50    Average packs/day: 0.5 packs/day for 46.0 years (23.0 ttl pk-yrs)    Types: Cigarettes    Start date: 43   Smokeless tobacco: Never  Vaping Use   Vaping status: Never Used  Substance and Sexual Activity   Alcohol use: No   Drug use: No   Sexual activity: Not on file  Other Topics Concern   Not on file  Social History Narrative   Not on file   Social Drivers of Health   Tobacco Use: High Risk (07/18/2024)   Patient History    Smoking Tobacco Use: Every Day    Smokeless Tobacco Use: Never    Passive Exposure: Not on file  Financial Resource Strain: Not on file  Food Insecurity: Patient Unable To Answer (07/18/2024)   Epic    Worried About Programme Researcher, Broadcasting/film/video in the Last Year: Patient unable to answer    Ran Out of Food in the Last Year: Patient unable to answer  Transportation Needs: Unmet Transportation Needs (07/18/2024)   Epic    Lack of Transportation (Medical): Yes    Lack of Transportation (Non-Medical): Yes  Physical Activity: Not on file  Stress: Not on file  Social Connections: Patient Unable To Answer (07/18/2024)   Social Connection and Isolation Panel  Frequency of Communication with Friends and Family: Patient unable to answer    Frequency of Social Gatherings with Friends and Family: Patient unable to answer    Attends Religious Services: Patient unable to answer    Active Member of Clubs or Organizations: Patient unable to answer    Attends Banker Meetings: Patient unable to answer    Marital Status: Patient unable to answer  Depression (PHQ2-9): Not on file   Alcohol Screen: Low Risk (07/18/2024)   Alcohol Screen    Last Alcohol Screening Score (AUDIT): 0  Housing: Unknown (07/18/2024)   Epic    Unable to Pay for Housing in the Last Year: Patient unable to answer    Number of Times Moved in the Last Year: 0    Homeless in the Last Year: Patient declined  Utilities: Patient Unable To Answer (07/18/2024)   Epic    Threatened with loss of utilities: Patient unable to answer  Health Literacy: Not on file   Past Medical History:  Past Medical History:  Diagnosis Date   Biventricular failure (HCC)    CVA (cerebral vascular accident) (HCC)    Polysubstance use disorder    Schizophrenia (HCC)     Past Surgical History:  Procedure Laterality Date   APPENDECTOMY     TRACHEOSTOMY TUBE PLACEMENT N/A 03/04/2024   Procedure: CREATION, TRACHEOSTOMY;  Surgeon: [name] Massie RAMAN, MD;  Location: [facility] ORS;  Service: ENT;  Laterality: N/A;    Current Medications: Current Facility-Administered Medications  Medication Dose Route Frequency Provider Last Rate Last Admin   acetaminophen  (TYLENOL ) tablet 650 mg  650 mg Oral Q6H PRN Hampton, Tracie B, NP   650 mg at 08/27/24 1843   alum & mag hydroxide-simeth (MAALOX/MYLANTA) 200-200-20 MG/5ML suspension 30 mL  30 mL Oral Q4H PRN Hampton, Tracie B, NP   30 mL at 08/05/24 0306   amiodarone  (PACERONE ) tablet 400 mg  400 mg Oral Daily [name], [name], MD   400 mg at 08/30/24 1028   amoxicillin -clavulanate (AUGMENTIN ) 875-125 MG per tablet 1 tablet  1 tablet Oral Q12H [name], [name], MD   1 tablet at 08/30/24 1028   ARIPiprazole  ER (ABILIFY  MAINTENA) injection 400 mg  400 mg Intramuscular Q28 days [name], [name], MD   400 mg at 08/24/24 9065   benzocaine  (ORAJEL) 10 % mucosal gel   Mouth/Throat TID PRN Timothy Townsel R, MD       divalproex  (DEPAKOTE  ER) 24 hr tablet 1,000 mg  1,000 mg Oral QHS [name], [name], MD   1,000 mg at 08/29/24 2050   docusate sodium  (COLACE) capsule 100 mg  100 mg Oral Daily  [name], [name], MD   100 mg at 08/30/24 1028   feeding supplement (ENSURE PLUS HIGH PROTEIN) liquid 237 mL  237 mL Oral TID BM Loula Marcella R, MD   237 mL at 08/30/24 1429   hydrOXYzine  (ATARAX ) tablet 25 mg  25 mg Oral TID PRN Hampton, Tracie B, NP   25 mg at 08/28/24 2101   magnesium  hydroxide (MILK OF MAGNESIA) suspension 30 mL  30 mL Oral Daily PRN Hampton, Tracie B, NP   30 mL at 08/08/24 2233   memantine  (NAMENDA ) tablet 10 mg  10 mg Oral Daily [name], [name], MD   10 mg at 08/30/24 1028   metoprolol  tartrate (LOPRESSOR ) tablet 12.5 mg  12.5 mg Oral BID [name], [name], MD   12.5 mg at 08/30/24 1028   naproxen  (NAPROSYN ) tablet 250 mg  250 mg Oral BID WC  [name], [name], MD   250 mg at 08/30/24 1607   OLANZapine  (ZYPREXA ) injection 5 mg  5 mg Intramuscular TID PRN Hampton, Tracie B, NP       OLANZapine  zydis (ZYPREXA ) disintegrating tablet 5 mg  5 mg Oral TID PRN Hampton, Tracie B, NP   5 mg at 08/04/24 1129   oxyCODONE  (Oxy IR/ROXICODONE ) immediate release tablet 5 mg  5 mg Oral Q4H PRN Dorinda Homans T, MD   5 mg at 08/12/24 0451   pantoprazole  (PROTONIX ) EC tablet 40 mg  40 mg Oral Daily [name], [name], MD   40 mg at 08/30/24 1028   sodium chloride  (OCEAN) 0.65 % nasal spray 1 spray  1 spray Each Nare PRN Donnelly Mellow, MD   1 spray at 08/18/24 1459   traZODone  (DESYREL ) tablet 50 mg  50 mg Oral QHS PRN Hampton, Tracie B, NP   50 mg at 08/28/24 2101    Lab Results:  No results found for this or any previous visit (from the past 48 hours).    Blood Alcohol level:  Lab Results  Component Value Date   [facility] <15 07/17/2024   ETH <15 02/20/2024    Metabolic Disorder Labs: Lab Results  Component Value Date   HGBA1C 5.4 08/12/2024   MPG 108.28 08/12/2024   MPG 119.76 07/14/2018   No results found for: PROLACTIN Lab Results  Component Value Date   CHOL 139 08/12/2024   TRIG 73 08/12/2024   HDL 52 08/12/2024   CHOLHDL 2.7 08/12/2024   VLDL 15 08/12/2024   LDLCALC  72 08/12/2024   LDLCALC 119 (H) 07/13/2018    Physical Findings: AIMS:  , ,  ,  ,    CIWA:    COWS:      Psychiatric Specialty Exam:  Presentation  General Appearance: Appropriate for Environment  Eye Contact:Fleeting  Speech:Normal Rate  Speech Volume:Decreased    Mood and Affect  Mood:Anxious  Affect:Flat   Thought Process  Thought Processes:circumstantial Orientation:Partial  Thought Content:discharge focused  Hallucinations:denies  Ideas of Reference:Delusions At baseline Suicidal Thoughts:denies  Homicidal Thoughts:denies   Sensorium  Memory:Immediate Poor; Recent Poor; Remote Poor  Judgment:Impaired  Insight:None   Executive Functions  Concentration:Poor  Attention Span:Poor  Recall:Poor  Fund of Knowledge:Poor  Language:Fair   Psychomotor Activity  Psychomotor Activity:No data recorded  Musculoskeletal: Strength & Muscle Tone: within normal limits Gait & Station: normal Assets  Assets:Communication Skills; Desire for Improvement; Social Support    Physical Exam: Physical Exam Vitals and nursing note reviewed.    ROS Blood pressure (!) 129/112, pulse 60, temperature (!) 97 F (36.1 C), resp. rate 20, height 6' 1 (1.854 m), weight 81.2 kg, SpO2 96%. Body mass index is 23.62 kg/m.  Diagnosis: Active Problems:   Schizophrenia, paranoid (HCC)   Dementia with behavioral disturbance (HCC)   PLAN: Safety and Monitoring:  -- Involuntary admission to inpatient psychiatric unit for safety, stabilization and treatment  -- Daily contact with patient to assess and evaluate symptoms and progress in treatment  -- Patient's case to be discussed in multi-disciplinary team meeting  -- Observation Level : q15 minute checks  -- Vital signs:  q12 hours  -- Precautions: suicide, elopement, and assault -- Encouraged patient to participate in unit milieu and in scheduled group therapies  2. Psychiatric Treatment:  Scheduled  Medications:  Discontinued oral Abilify  LAY Abilify  Maintena 400 mg q4 weeks given on 07/27/24, 08/24/2024  Cogentin  1mg  one dose given for EPS Depakote  ER increased to  1000 mg to help with mood stabilization as he was receiving multiple as needed antipsychotics for agitation  -- The risks/benefits/side-effects/alternatives to this medication were discussed in detail with the patient and time was given for questions. The patient consents to medication trial.  3. Medical Issues Being Addressed:  [facility] consulted for wound care management   Assessment and Plan:   Sacral Decubitus Ulcer Full thickness decubitus ulcer in setting of recent extended hospitalization, discharged from [facility] 14th Wound looks pretty good overall WOC on board we appreciate input   Cognitive Deficit Concern for anoxic brain injury at discharge 02/2024 Delirium precautions   Peripheral Vascular Disease Legs with features c/w peripheral vascular disease, diminished DP pulses Would benefit from outpatient ABI's   HFrEF Euvolemic at this time Continue home medications   Hx Atrial Flutter Not on any anticoagulation at this time EKG with NSR Continue amiodarone   Hypertension Noted, adjust meds gradually after med rec complete   Tooth pain/Infection - Added: Augmentin  along with Orajel BP -better today no longer hypotensive-Will continue reduced dose of metoprolol  12.5 bid.   4. Discharge Planning:   -- Social work and case management to assist with discharge planning and identification of hospital follow-up needs prior to discharge  -- Estimated LOS: 3-4 days  Millie JONELLE Manners, MD 08/30/2024, 6:00 PM

## 2024-08-30 NOTE — Progress Notes (Signed)
 [facility] MD Progress Note  08/30/2024 2:35 PM Miguel [name]  MRN:  [id number]  Miguel [name] is a 74 year old male with a past psychiatric history significant for schizophrenia who presents under involuntary commitment initiated by family due to medication non-adherence, increasing paranoia, and behavioral disorganization. 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 Brother reports multiple episodes of patient whispering through the night, stating people were talking about him and planning to harm him. Patient was advised by his ACT Team to come to the ED; he refused, prompting the family to pursue IVC.Patient is admitted to [facility] unit with Q15 min safety monitoring. Multidisciplinary team approach is offered. Medication management; group/milieu therapy is offered.  Subjective:  Chart reviewed, case discussed in multidisciplinary meeting, patient seen during rounds.  Per staff report patient blood pressure has been stable 122/80.  Having sleeping okay.  Reportedly last night he slept for 5 hours.  Patient on interview reports that he is doing well.  Denies any suicidal homicidal ideations or any auditory visual hallucinations.  Denied that he does not want to harm his brother.  Patient has received Abilify  injection.  Abilify  oral dose have been discontinued.  No concerns at this time.     Past Psychiatric History: see h&P Family History:  Family History  Family history unknown: Yes   Social History:  Social History   Substance and Sexual Activity  Alcohol Use No      Social History   Substance and Sexual Activity  Drug Use No    Social History   Socioeconomic History   Marital status: Divorced    Spouse name: Not on file   Number of children: Not on file   Years of education: Not on file   Highest education level: Not on file  Occupational History   Not on file  Tobacco Use   Smoking status: Every Day    Current packs/day: 0.50    Average packs/day: 0.5 packs/day for 46.0 years (23.0 ttl pk-yrs)    Types: Cigarettes    Start date: 35   Smokeless tobacco: Never  Vaping Use   Vaping status: Never Used  Substance and Sexual Activity   Alcohol use: No   Drug use: No   Sexual activity: Not on file  Other Topics Concern   Not on file  Social History Narrative   Not on file   Social Drivers of Health   Tobacco Use: High Risk (07/18/2024)   Patient History    Smoking Tobacco Use: Every Day    Smokeless Tobacco Use: Never    Passive Exposure: Not on file  Financial Resource Strain: Not on file  Food Insecurity: Patient Unable To Answer (07/18/2024)   Epic    Worried About Programme Researcher, Broadcasting/film/video in the Last Year: Patient unable to answer    Ran Out of Food in the Last Year: Patient unable to answer  Transportation Needs: Unmet Transportation Needs (07/18/2024)   Epic    Lack of Transportation (Medical): Yes    Lack of Transportation (Non-Medical): Yes  Physical Activity: Not on file  Stress: Not on file  Social Connections: Patient Unable To Answer (07/18/2024)   Social Connection and Isolation Panel  Frequency of Communication with Friends and Family: Patient unable to answer    Frequency of Social Gatherings with Friends and Family: Patient unable to answer    Attends Religious Services: Patient unable to answer    Active Member of Clubs or Organizations: Patient unable to answer    Attends Banker Meetings: Patient unable to answer    Marital Status: Patient unable to answer  Depression (PHQ2-9): Not on file   Alcohol Screen: Low Risk (07/18/2024)   Alcohol Screen    Last Alcohol Screening Score (AUDIT): 0  Housing: Unknown (07/18/2024)   Epic    Unable to Pay for Housing in the Last Year: Patient unable to answer    Number of Times Moved in the Last Year: 0    Homeless in the Last Year: Patient declined  Utilities: Patient Unable To Answer (07/18/2024)   Epic    Threatened with loss of utilities: Patient unable to answer  Health Literacy: Not on file   Past Medical History:  Past Medical History:  Diagnosis Date   Biventricular failure (HCC)    CVA (cerebral vascular accident) (HCC)    Polysubstance use disorder    Schizophrenia (HCC)     Past Surgical History:  Procedure Laterality Date   APPENDECTOMY     TRACHEOSTOMY TUBE PLACEMENT N/A 03/04/2024   Procedure: CREATION, TRACHEOSTOMY;  Surgeon: [name] Massie RAMAN, MD;  Location: [facility] ORS;  Service: ENT;  Laterality: N/A;    Current Medications: Current Facility-Administered Medications  Medication Dose Route Frequency Provider Last Rate Last Admin   acetaminophen  (TYLENOL ) tablet 650 mg  650 mg Oral Q6H PRN Hampton, Tracie B, NP   650 mg at 08/27/24 1843   alum & mag hydroxide-simeth (MAALOX/MYLANTA) 200-200-20 MG/5ML suspension 30 mL  30 mL Oral Q4H PRN Hampton, Tracie B, NP   30 mL at 08/05/24 0306   amiodarone  (PACERONE ) tablet 400 mg  400 mg Oral Daily [name], [name], MD   400 mg at 08/30/24 1028   amoxicillin -clavulanate (AUGMENTIN ) 875-125 MG per tablet 1 tablet  1 tablet Oral Q12H [name], [name], MD   1 tablet at 08/30/24 1028   ARIPiprazole  ER (ABILIFY  MAINTENA) injection 400 mg  400 mg Intramuscular Q28 days [name], [name], MD   400 mg at 08/24/24 9065   benzocaine  (ORAJEL) 10 % mucosal gel   Mouth/Throat TID PRN Jordanny Waddington R, MD       divalproex  (DEPAKOTE  ER) 24 hr tablet 1,000 mg  1,000 mg Oral QHS [name], [name], MD   1,000 mg at 08/29/24 2050   docusate sodium  (COLACE) capsule 100 mg  100 mg Oral Daily  [name], [name], MD   100 mg at 08/30/24 1028   feeding supplement (ENSURE PLUS HIGH PROTEIN) liquid 237 mL  237 mL Oral TID BM Eydie Wormley R, MD   237 mL at 08/30/24 1429   hydrOXYzine  (ATARAX ) tablet 25 mg  25 mg Oral TID PRN Hampton, Tracie B, NP   25 mg at 08/28/24 2101   magnesium  hydroxide (MILK OF MAGNESIA) suspension 30 mL  30 mL Oral Daily PRN Hampton, Tracie B, NP   30 mL at 08/08/24 2233   memantine  (NAMENDA ) tablet 10 mg  10 mg Oral Daily [name], [name], MD   10 mg at 08/30/24 1028   metoprolol  tartrate (LOPRESSOR ) tablet 12.5 mg  12.5 mg Oral BID [name], [name], MD   12.5 mg at 08/30/24 1028   naproxen  (NAPROSYN ) tablet 250 mg  250 mg Oral BID WC  [name], [name], MD   250 mg at 08/30/24 9194   OLANZapine  (ZYPREXA ) injection 5 mg  5 mg Intramuscular TID PRN Hampton, Tracie B, NP       OLANZapine  zydis (ZYPREXA ) disintegrating tablet 5 mg  5 mg Oral TID PRN Hampton, Tracie B, NP   5 mg at 08/04/24 1129   oxyCODONE  (Oxy IR/ROXICODONE ) immediate release tablet 5 mg  5 mg Oral Q4H PRN Dorinda Homans T, MD   5 mg at 08/12/24 0451   pantoprazole  (PROTONIX ) EC tablet 40 mg  40 mg Oral Daily [name], [name], MD   40 mg at 08/30/24 1028   sodium chloride  (OCEAN) 0.65 % nasal spray 1 spray  1 spray Each Nare PRN [name], [name], MD   1 spray at 08/18/24 1459   traZODone  (DESYREL ) tablet 50 mg  50 mg Oral QHS PRN Hampton, Tracie B, NP   50 mg at 08/28/24 2101    Lab Results:  No results found for this or any previous visit (from the past 48 hours).    Blood Alcohol level:  Lab Results  Component Value Date   [facility] <15 07/17/2024   ETH <15 02/20/2024    Metabolic Disorder Labs: Lab Results  Component Value Date   HGBA1C 5.4 08/12/2024   MPG 108.28 08/12/2024   MPG 119.76 07/14/2018   No results found for: PROLACTIN Lab Results  Component Value Date   CHOL 139 08/12/2024   TRIG 73 08/12/2024   HDL 52 08/12/2024   CHOLHDL 2.7 08/12/2024   VLDL 15 08/12/2024   LDLCALC  72 08/12/2024   LDLCALC 119 (H) 07/13/2018    Physical Findings: AIMS:  , ,  ,  ,    CIWA:    COWS:      Psychiatric Specialty Exam:  Presentation  General Appearance: Appropriate for Environment  Eye Contact:Fleeting  Speech:Normal Rate  Speech Volume:Decreased    Mood and Affect  Mood:Anxious  Affect:Flat   Thought Process  Thought Processes:circumstantial Orientation:Partial  Thought Content:discharge focused  Hallucinations:denies  Ideas of Reference:Delusions At baseline Suicidal Thoughts:denies  Homicidal Thoughts:denies   Sensorium  Memory:Immediate Poor; Recent Poor; Remote Poor  Judgment:Impaired  Insight:None   Executive Functions  Concentration:Poor  Attention Span:Poor  Recall:Poor  Fund of Knowledge:Poor  Language:Fair   Psychomotor Activity  Psychomotor Activity:No data recorded  Musculoskeletal: Strength & Muscle Tone: within normal limits Gait & Station: normal Assets  Assets:Communication Skills; Desire for Improvement; Social Support    Physical Exam: Physical Exam Vitals and nursing note reviewed.    ROS Blood pressure (!) 129/112, pulse 60, temperature (!) 97 F (36.1 C), resp. rate 20, height 6' 1 (1.854 m), weight 81.2 kg, SpO2 96%. Body mass index is 23.62 kg/m.  Diagnosis: Active Problems:   Schizophrenia, paranoid (HCC)   Dementia with behavioral disturbance (HCC)   PLAN: Safety and Monitoring:  -- Involuntary admission to inpatient psychiatric unit for safety, stabilization and treatment  -- Daily contact with patient to assess and evaluate symptoms and progress in treatment  -- Patient's case to be discussed in multi-disciplinary team meeting  -- Observation Level : q15 minute checks  -- Vital signs:  q12 hours  -- Precautions: suicide, elopement, and assault -- Encouraged patient to participate in unit milieu and in scheduled group therapies  2. Psychiatric Treatment:  Scheduled  Medications:  Discontinued oral Abilify  LAY Abilify  Maintena 400 mg q4 weeks given on 07/27/24, 08/24/2024  Cogentin  1mg  one dose given for EPS Depakote  ER increased to  1000 mg to help with mood stabilization as he was receiving multiple as needed antipsychotics for agitation  -- The risks/benefits/side-effects/alternatives to this medication were discussed in detail with the patient and time was given for questions. The patient consents to medication trial.  3. Medical Issues Being Addressed:  [facility] consulted for wound care management   Assessment and Plan:   Sacral Decubitus Ulcer Full thickness decubitus ulcer in setting of recent extended hospitalization, discharged from [facility] 14th Wound looks pretty good overall WOC on board we appreciate input   Cognitive Deficit Concern for anoxic brain injury at discharge 02/2024 Delirium precautions   Peripheral Vascular Disease Legs with features c/w peripheral vascular disease, diminished DP pulses Would benefit from outpatient ABI's   HFrEF Euvolemic at this time Continue home medications   Hx Atrial Flutter Not on any anticoagulation at this time EKG with NSR Continue amiodarone   Hypertension Noted, adjust meds gradually after med rec complete   Tooth pain/Infection - Added: Augmentin  along with Orajel BP -better today no longer hypotensive-Will continue reduced dose of metoprolol  12.5 bid.   4. Discharge Planning:   -- Social work and case management to assist with discharge planning and identification of hospital follow-up needs prior to discharge  -- Estimated LOS: 3-4 days  Millie JONELLE Manners, MD 08/30/2024, 2:35 PM

## 2024-08-30 NOTE — Plan of Care (Signed)
" °  Problem: Activity: Goal: Interest or engagement in activities will improve Outcome: Progressing   Problem: Activity: Goal: Sleeping patterns will improve Outcome: Not Progressing   Problem: Physical Regulation: Goal: Ability to maintain clinical measurements within normal limits will improve Outcome: Progressing   Problem: Safety: Goal: Periods of time without injury will increase Outcome: Progressing   "

## 2024-08-30 NOTE — Group Note (Signed)
 [facility] LCSW Group Therapy Note   Group Date: 08/30/2024 Start Time: 1030 End Time: 1115   Type of Therapy/Topic:  Group Therapy:  Emotion Regulation  Participation Level:  Active   Mood: pleasant, calm  Description of Group:    The purpose of this group is to assist patients in learning to regulate negative emotions and experience positive emotions. Patients will be guided to discuss ways in which they have been vulnerable to their negative emotions. These vulnerabilities will be juxtaposed with experiences of positive emotions or situations, and patients challenged to use positive emotions to combat negative ones. Special emphasis will be placed on coping with negative emotions in conflict situations, and patients will process healthy conflict resolution skills.  Therapeutic Goals: Patient will identify two positive emotions or experiences to reflect on in order to balance out negative emotions:  Patient will label two or more emotions that they find the most difficult to experience:  Patient will be able to demonstrate positive conflict resolution skills through discussion or role plays:   Summary of Patient Progress: The facilitator and patient discussed how to regulate negative emotions and experience positive emotions. Group members discussed their positive emotions and strategies for negative emotions.The facilitator and patient examined how different  emotions can influence their mood.  The patient reflected on how their emotions can shape their current thoughts.  The patient was receptive to feedback from both peers and the facilitator and contributed to creating a supportive environment, encouraging others to open up and share.   Therapeutic Modalities:   Cognitive Behavioral Therapy Feelings Identification Dialectical Behavioral Therapy   Rexene LELON Mae, LCSWA

## 2024-08-30 NOTE — Progress Notes (Signed)
" °   08/29/24 1940  Psych Admission Type (Psych Patients Only)  Admission Status Involuntary  Psychosocial Assessment  Patient Complaints None  Eye Contact Fair  Facial Expression Animated  Affect Appropriate to circumstance  Speech Soft  Interaction Assertive  Motor Activity Slow  Appearance/Hygiene Unremarkable;In scrubs  Behavior Characteristics Cooperative;Calm  Mood Pleasant  Aggressive Behavior  Effect No apparent injury  Thought Process  Coherency Tangential  Content Preoccupation  Delusions None reported or observed  Perception WDL  Hallucination None reported or observed  Judgment Poor  Confusion Mild  Danger to Self  Current suicidal ideation? Denies  Agreement Not to Harm Self Yes  Description of Agreement Verbal  Danger to Others  Danger to Others None reported or observed    "

## 2024-08-30 NOTE — Group Note (Signed)
 Date:  08/30/2024 Time:  8:57 PM  Group Topic/Focus:  Wrap-Up Group:   The focus of this group is to help patients review their daily goal of treatment and discuss progress on daily workbooks.    Participation Level:  did not attend  Participation Quality:     Affect:     Cognitive:     Insight: None  Engagement in Group:    Modes of Intervention:     Additional Comments:    [name] 08/30/2024, 8:57 PM

## 2024-08-30 NOTE — Progress Notes (Signed)
 Patient accepted medications without issue. Denies SI/HI/AVH. Patient more irritable today than usual earlier in the shift. Was pleasant and talking with nurses after dinner. Wound care completed. Slight odor with change. Ongoing monitoring continues.   08/30/24 1100  Psych Admission Type (Psych Patients Only)  Admission Status Involuntary  Psychosocial Assessment  Patient Complaints None  Eye Contact Fair  Facial Expression Animated  Affect Irritable  Speech Slurred;Soft  Interaction Assertive  Motor Activity Slow  Appearance/Hygiene Unremarkable  Behavior Characteristics Guarded;Calm  Mood Irritable  Thought Process  Coherency Tangential  Content Preoccupation  Delusions None reported or observed  Perception WDL  Hallucination None reported or observed  Judgment Poor  Confusion Mild  Danger to Self  Current suicidal ideation? Denies  Danger to Others  Danger to Others None reported or observed

## 2024-08-31 DIAGNOSIS — F03918 Unspecified dementia, unspecified severity, with other behavioral disturbance: Secondary | ICD-10-CM | POA: Diagnosis not present

## 2024-08-31 DIAGNOSIS — F200 Paranoid schizophrenia: Secondary | ICD-10-CM | POA: Diagnosis not present

## 2024-08-31 NOTE — BHH Counselor (Signed)
 CSW called Romero at [facility] who reports that at this time they have been able to find placement.   CSW informed Romero that pt's brother was appointed Interim Guardian.   Romero expressed concerns regarding the courts decision.   CSW also expressed concerns as pt's brother has not checked on pt's progress since being appointed and has not visited pt.   Romero reports she and Dr. DELENA ([place]'s psychiatrist) would both be willing to talk to APS about their concerns.    CSW contacted Nia Delores, guardianship social worker regarding concerns.   Nia reports that she was unaware that interim guardianship had been awarded. CSW forwarded interim guardianship documents to Nia per her request.   Nia reports that if there are concerns, someone would need to be present at the 09/07/24 court date to express those concerns.   CSW informed Nia that Romero and pt's outpatient psychiatrist have already expressed interest in discussing those concerns.   CSW informed Romero at [employer] that Nia reported they CAN come to the court hearing. CSW informed Romero of the date and time of court date.   Lum Croft, MSW, [place] 08/31/2024 3:37 PM

## 2024-08-31 NOTE — Plan of Care (Signed)
  Problem: Education: Goal: Knowledge of [place] [facility] Education information/materials will improve Outcome: Progressing   Problem: Activity: Goal: Interest or engagement in activities will improve Outcome: Progressing   Problem: Coping: Goal: Ability to verbalize frustrations and anger appropriately will improve Outcome: Progressing   Problem: Education: Goal: Knowledge of  General Education information/materials will improve Outcome: Progressing Goal: Emotional status will improve Outcome: Progressing Goal: Mental status will improve Outcome: Progressing Goal: Verbalization of understanding the information provided will improve Outcome: Progressing   Problem: Activity: Goal: Interest or engagement in activities will improve Outcome: Progressing Goal: Sleeping patterns will improve Outcome: Progressing   Problem: Coping: Goal: Ability to verbalize frustrations and anger appropriately will improve Outcome: Progressing Goal: Ability to demonstrate self-control will improve Outcome: Progressing   Problem: Health Behavior/Discharge Planning: Goal: Identification of resources available to assist in meeting health care needs will improve Outcome: Progressing Goal: Compliance with treatment plan for underlying cause of condition will improve Outcome: Progressing   Problem: Physical Regulation: Goal: Ability to maintain clinical measurements within normal limits will improve Outcome: Progressing   Problem: Safety: Goal: Periods of time without injury will increase Outcome: Progressing   Problem: Coping: Goal: Ability to adjust to condition or change in health will improve Outcome: Progressing   Problem: Health Behavior/Discharge Planning: Goal: Ability to identify and utilize available resources and services will improve Outcome: Progressing

## 2024-08-31 NOTE — BHH Counselor (Signed)
 CSW contacted [facility] Special Proceedings [phone] to confirm who was awarded guardianship at the court hearing scheduled on 08/17/24.   According to special proceedings, pt's brother Cherene was awarded interim guardian of the person and that the court date to award full guardianship to either him or the county will be on 09/07/2024.   Reports that the clerk does have the authority to extend the interim guardianship up to 45 days should the court date have to be rescheduled.   CSW asked special proceedings to email interim guardianship to CSW to be uploaded to medical records.   CSW awaits documentation at this time.   Lum Croft, MSW, [place] 08/31/2024 3:20 PM

## 2024-08-31 NOTE — Progress Notes (Signed)
 [facility] MD Progress Note  08/31/2024 5:12 PM Miguel [name]  MRN:  [id number]  Miguel Savant. Chavira is a 74 year old [name] with a past psychiatric history significant for schizophrenia who presents under involuntary commitment initiated by family due to medication non-adherence, increasing paranoia, and behavioral disorganization. 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 Brother reports multiple episodes of patient whispering through the night, stating people were talking about him and planning to harm him. Patient was advised by his ACT Team to come to the ED; he refused, prompting the family to pursue IVC.Patient is admitted to [facility] unit with Q15 min safety monitoring. Multidisciplinary team approach is offered. Medication management; group/milieu therapy is offered.  Subjective:  Chart reviewed, case discussed in multidisciplinary meeting, patient seen during rounds.   On interview today patient came up to the nurses station and wanted to talk to the provider at.  He was completely disorganized in thought process and was not able to give any linear responses.  When asked about his family he reports not seeing them lately but talks about how his brother and sisters are trying to control his life.  He denies SI/HI/plan and denies hallucinations.  He remains delusional and complaints about the staff members on the unit.  Patient is taking his medications and denies having any side effects   Past Psychiatric History: see h&P Family History:  Family History  Family history unknown: Yes    Social History:  Social History   Substance and Sexual Activity  Alcohol Use No     Social History   Substance and Sexual Activity  Drug Use No    Social History   Socioeconomic History   Marital status: Divorced    Spouse name: Not on file   Number of children: Not on file   Years of education: Not on file   Highest education level: Not on file  Occupational History   Not on file  Tobacco Use   Smoking status: Every Day    Current packs/day: 0.50    Average packs/day: 0.5 packs/day for 46.0 years (23.0 ttl pk-yrs)    Types: Cigarettes    Start date: 20   Smokeless tobacco: Never  Vaping Use   Vaping status: Never Used  Substance and Sexual Activity   Alcohol use: No   Drug use: No   Sexual activity: Not on file  Other Topics Concern   Not on file  Social History Narrative   Not on file   Social Drivers of Health   Tobacco Use: High Risk (07/18/2024)   Patient History    Smoking Tobacco Use: Every Day    Smokeless Tobacco Use: Never    Passive Exposure: Not on file  Financial Resource Strain: Not on file  Food Insecurity: Patient Unable To Answer (07/18/2024)   Epic    Worried About Programme Researcher, Broadcasting/film/video in the Last Year: Patient unable to answer    Ran Out of Food in the Last Year: Patient unable to answer  Transportation Needs: Unmet Transportation Needs (07/18/2024)   Epic    Lack of Transportation (Medical): Yes    Lack of Transportation (Non-Medical): Yes  Physical Activity: Not on file  Stress:  Not on file  Social Connections: Patient Unable To Answer (07/18/2024)   Social Connection and Isolation Panel    Frequency of Communication with Friends and Family: Patient unable to answer    Frequency of Social Gatherings with Friends and Family: Patient unable to answer    Attends Religious Services: Patient unable to answer    Active Member of Clubs or Organizations: Patient unable to answer    Attends Banker Meetings: Patient unable  to answer    Marital Status: Patient unable to answer  Depression (PHQ2-9): Not on file  Alcohol Screen: Low Risk (07/18/2024)   Alcohol Screen    Last Alcohol Screening Score (AUDIT): 0  Housing: Unknown (07/18/2024)   Epic    Unable to Pay for Housing in the Last Year: Patient unable to answer    Number of Times Moved in the Last Year: 0    Homeless in the Last Year: Patient declined  Utilities: Patient Unable To Answer (07/18/2024)   Epic    Threatened with loss of utilities: Patient unable to answer  Health Literacy: Not on file   Past Medical History:  Past Medical History:  Diagnosis Date   Biventricular failure (HCC)    CVA (cerebral vascular accident) (HCC)    Polysubstance use disorder    Schizophrenia (HCC)     Past Surgical History:  Procedure Laterality Date   APPENDECTOMY     TRACHEOSTOMY TUBE PLACEMENT N/A 03/04/2024   Procedure: CREATION, TRACHEOSTOMY;  Surgeon: [name] Massie RAMAN, MD;  Location: [facility] ORS;  Service: ENT;  Laterality: N/A;    Current Medications: Current Facility-Administered Medications  Medication Dose Route Frequency Provider Last Rate Last Admin   acetaminophen  (TYLENOL ) tablet 650 mg  650 mg Oral Q6H PRN Hampton, Tracie B, NP   650 mg at 08/30/24 1928   alum & mag hydroxide-simeth (MAALOX/MYLANTA) 200-200-20 MG/5ML suspension 30 mL  30 mL Oral Q4H PRN Hampton, Tracie B, NP   30 mL at 08/05/24 0306   amiodarone  (PACERONE ) tablet 400 mg  400 mg Oral Daily [name], [name], MD   400 mg at 08/31/24 9162   amoxicillin -clavulanate (AUGMENTIN ) 875-125 MG per tablet 1 tablet  1 tablet Oral Q12H [name], [name], MD   1 tablet at 08/31/24 9161   ARIPiprazole  ER (ABILIFY  MAINTENA) injection 400 mg  400 mg Intramuscular Q28 days Janese Radabaugh, MD   400 mg at 08/24/24 9065   benzocaine  (ORAJEL) 10 % mucosal gel   Mouth/Throat TID PRN [name], [name], MD       divalproex  (DEPAKOTE  ER) 24 hr tablet 1,000 mg  1,000 mg Oral QHS Onetta Spainhower, MD   1,000  mg at 08/30/24 2133   docusate sodium  (COLACE) capsule 100 mg  100 mg Oral Daily Kron Everton, MD   100 mg at 08/31/24 0839   feeding supplement (ENSURE PLUS HIGH PROTEIN) liquid 237 mL  237 mL Oral TID BM [name], [name], MD   237 mL at 08/31/24 1340   hydrOXYzine  (ATARAX ) tablet 25 mg  25 mg Oral TID PRN Hampton, Tracie B, NP   25 mg at 08/30/24 2132   magnesium  hydroxide (MILK OF MAGNESIA) suspension 30 mL  30 mL Oral Daily PRN Hampton, Tracie B, NP   30 mL at 08/08/24 2233   memantine  (NAMENDA ) tablet 10 mg  10 mg Oral Daily Dezi Schaner, MD   10 mg at 08/31/24 9160   metoprolol  tartrate (LOPRESSOR ) tablet 12.5 mg  12.5 mg Oral BID [name], [name],  MD   12.5 mg at 08/30/24 2132   naproxen  (NAPROSYN ) tablet 250 mg  250 mg Oral BID WC Haruko Mersch, MD   250 mg at 08/31/24 1650   OLANZapine  (ZYPREXA ) injection 5 mg  5 mg Intramuscular TID PRN Hampton, Tracie B, NP       OLANZapine  zydis (ZYPREXA ) disintegrating tablet 5 mg  5 mg Oral TID PRN Hampton, Tracie B, NP   5 mg at 08/04/24 1129   oxyCODONE  (Oxy IR/ROXICODONE ) immediate release tablet 5 mg  5 mg Oral Q4H PRN Dorinda Homans T, MD   5 mg at 08/12/24 0451   pantoprazole  (PROTONIX ) EC tablet 40 mg  40 mg Oral Daily [name], [name], MD   40 mg at 08/31/24 9161   sodium chloride  (OCEAN) 0.65 % nasal spray 1 spray  1 spray Each Nare PRN Teddrick Mallari, MD   1 spray at 08/18/24 1459   traZODone  (DESYREL ) tablet 50 mg  50 mg Oral QHS PRN Hampton, Tracie B, NP   50 mg at 08/30/24 2132    Lab Results:  No results found for this or any previous visit (from the past 48 hours).    Blood Alcohol level:  Lab Results  Component Value Date   [facility] <15 07/17/2024   ETH <15 02/20/2024    Metabolic Disorder Labs: Lab Results  Component Value Date   HGBA1C 5.4 08/12/2024   MPG 108.28 08/12/2024   MPG 119.76 07/14/2018   No results found for: PROLACTIN Lab Results  Component Value Date   CHOL 139 08/12/2024   TRIG 73  08/12/2024   HDL 52 08/12/2024   CHOLHDL 2.7 08/12/2024   VLDL 15 08/12/2024   LDLCALC 72 08/12/2024   LDLCALC 119 (H) 07/13/2018    Physical Findings: AIMS:  , ,  ,  ,    CIWA:    COWS:      Psychiatric Specialty Exam:  Presentation  General Appearance: Appropriate for Environment  Eye Contact:Fleeting  Speech:Normal Rate  Speech Volume:Decreased    Mood and Affect  Mood:Anxious  Affect:Flat   Thought Process  Thought Processes:circumstantial Orientation:Partial  Thought Content:discharge focused  Hallucinations:denies  Ideas of Reference:Delusions At baseline Suicidal Thoughts:denies  Homicidal Thoughts:denies   Sensorium  Memory:Immediate Poor; Recent Poor; Remote Poor  Judgment:Impaired  Insight:None   Executive Functions  Concentration:Poor  Attention Span:Poor  Recall:Poor  Fund of Knowledge:Poor  Language:Fair   Psychomotor Activity  Psychomotor Activity:No data recorded  Musculoskeletal: Strength & Muscle Tone: within normal limits Gait & Station: normal Assets  Assets:Communication Skills; Desire for Improvement; Social Support    Physical Exam: Physical Exam Vitals and nursing note reviewed.    ROS Blood pressure (!) 93/57, pulse 61, temperature 97.8 F (36.6 C), resp. rate 17, height 6' 1 (1.854 m), weight 81.2 kg, SpO2 100%. Body mass index is 23.62 kg/m.  Diagnosis: Active Problems:   Schizophrenia, paranoid (HCC)   Dementia with behavioral disturbance (HCC)   PLAN: Safety and Monitoring:  -- Involuntary admission to inpatient psychiatric unit for safety, stabilization and treatment  -- Daily contact with patient to assess and evaluate symptoms and progress in treatment  -- Patient's case to be discussed in multi-disciplinary team meeting  -- Observation Level : q15 minute checks  -- Vital signs:  q12 hours  -- Precautions: suicide, elopement, and assault -- Encouraged patient to participate in unit  milieu and in scheduled group therapies  2. Psychiatric Treatment:  Scheduled Medications:  Discontinued oral Abilify  LAY Abilify  Maintena  400 mg q4 weeks given on 07/27/24, 08/24/2024  Cogentin  1mg  one dose given for EPS Depakote  ER increased to 1000 mg to help with mood stabilization as he was receiving multiple as needed antipsychotics for agitation  -- The risks/benefits/side-effects/alternatives to this medication were discussed in detail with the patient and time was given for questions. The patient consents to medication trial.  3. Medical Issues Being Addressed:  [facility] consulted for wound care management   Assessment and Plan:   Sacral Decubitus Ulcer Full thickness decubitus ulcer in setting of recent extended hospitalization, discharged from [facility] Wound looks pretty good overall WOC on board we appreciate input   Cognitive Deficit Concern for anoxic brain injury at discharge 02/2024 Delirium precautions   Peripheral Vascular Disease Legs with features c/w peripheral vascular disease, diminished DP pulses Would benefit from outpatient ABI's   HFrEF Euvolemic at this time Continue home medications   Hx Atrial Flutter Not on any anticoagulation at this time EKG with NSR Continue amiodarone   Hypertension Noted, adjust meds gradually after med rec complete   Tooth pain/Infection - Added: Augmentin  along with Orajel BP -better today no longer hypotensive-Will continue reduced dose of metoprolol  12.5 bid.   4. Discharge Planning:   -- Social work and case management to assist with discharge planning and identification of hospital follow-up needs prior to discharge  -- Estimated LOS: 3-4 days  Donyae Kohn, MD 08/31/2024, 5:12 PM

## 2024-08-31 NOTE — BH IP Treatment Plan (Signed)
 Interdisciplinary Treatment and Diagnostic Plan Update  08/31/2024 Time of Session: 2:30 PM  Miguel [name] MRN: [id number]  Principal Diagnosis: <principal problem not specified>  Secondary Diagnoses: Active Problems:   Schizophrenia, paranoid (HCC)   Dementia with behavioral disturbance (HCC)   Current Medications:  Current Facility-Administered Medications  Medication Dose Route Frequency Provider Last Rate Last Admin   acetaminophen  (TYLENOL ) tablet 650 mg  650 mg Oral Q6H PRN Hampton, Tracie B, NP   650 mg at 08/30/24 1928   alum & mag hydroxide-simeth (MAALOX/MYLANTA) 200-200-20 MG/5ML suspension 30 mL  30 mL Oral Q4H PRN Hampton, Tracie B, NP   30 mL at 08/05/24 0306   amiodarone  (PACERONE ) tablet 400 mg  400 mg Oral Daily [name], [name], MD   400 mg at 08/31/24 9162   amoxicillin -clavulanate (AUGMENTIN ) 875-125 MG per tablet 1 tablet  1 tablet Oral Q12H [name], [name], MD   1 tablet at 08/31/24 9161   ARIPiprazole  ER (ABILIFY  MAINTENA) injection 400 mg  400 mg Intramuscular Q28 days [name], [name], MD   400 mg at 08/24/24 9065   benzocaine  (ORAJEL) 10 % mucosal gel   Mouth/Throat TID PRN [name], [name], MD       divalproex  (DEPAKOTE  ER) 24 hr tablet 1,000 mg  1,000 mg Oral QHS [name], [name], MD   1,000 mg at 08/30/24 2133   docusate sodium  (COLACE) capsule 100 mg  100 mg Oral Daily [name], [name], MD   100 mg at 08/31/24 0839   feeding supplement (ENSURE PLUS HIGH PROTEIN) liquid 237 mL  237 mL Oral TID BM [name], [name], MD   237 mL at 08/31/24 1340   hydrOXYzine  (ATARAX ) tablet 25 mg  25 mg Oral TID PRN Hampton, Tracie B, NP   25 mg at 08/30/24 2132   magnesium  hydroxide (MILK OF MAGNESIA) suspension 30 mL  30 mL Oral Daily PRN Hampton, Tracie B, NP   30 mL at 08/08/24 2233   memantine  (NAMENDA ) tablet 10 mg  10 mg Oral Daily [name], [name], MD   10 mg at 08/31/24 9160   metoprolol  tartrate (LOPRESSOR ) tablet 12.5 mg  12.5 mg Oral BID [name], [name], MD    12.5 mg at 08/30/24 2132   naproxen  (NAPROSYN ) tablet 250 mg  250 mg Oral BID WC [name], [name], MD   250 mg at 08/31/24 9160   OLANZapine  (ZYPREXA ) injection 5 mg  5 mg Intramuscular TID PRN Hampton, Tracie B, NP       OLANZapine  zydis (ZYPREXA ) disintegrating tablet 5 mg  5 mg Oral TID PRN Hampton, Tracie B, NP   5 mg at 08/04/24 1129   oxyCODONE  (Oxy IR/ROXICODONE ) immediate release tablet 5 mg  5 mg Oral Q4H PRN Dorinda Drue DASEN, MD   5 mg at 08/12/24 0451   pantoprazole  (PROTONIX ) EC tablet 40 mg  40 mg Oral Daily [name], [name], MD   40 mg at 08/31/24 9161   sodium chloride  (OCEAN) 0.65 % nasal spray 1 spray  1 spray Each Nare PRN [name], [name], MD   1 spray at 08/18/24 1459   traZODone  (DESYREL ) tablet 50 mg  50 mg Oral QHS PRN Hampton, Tracie B, NP   50 mg at 08/30/24 2132   PTA Medications: Medications Prior to Admission  Medication Sig Dispense Refill Last Dose/Taking   amiodarone  (PACERONE ) 400 MG tablet Place 1 tablet (400 mg total) into feeding tube daily.      ARIPiprazole  (ABILIFY ) 30 MG tablet Place 1 tablet (30 mg  total) into feeding tube at bedtime.      bisacodyl  (DULCOLAX) 10 MG suppository Place 1 suppository (10 mg total) rectally daily as needed for moderate constipation or severe constipation.      busPIRone  (BUSPAR ) 5 MG tablet Take 5 mg by mouth 3 (three) times daily.      Chlorhexidine  Gluconate Cloth 2 % PADS Apply 6 each topically daily.      digoxin  (LANOXIN ) 0.125 MG tablet 1 tablet (0.125 mg total) by Per NG tube route daily. (Patient taking differently: Take 0.125 mg by mouth every other day.)      docusate (COLACE) 50 MG/5ML liquid Place 10 mLs (100 mg total) into feeding tube 2 (two) times daily.      metoprolol  tartrate (LOPRESSOR ) 25 MG tablet Take 25 mg by mouth 2 (two) times daily.      pantoprazole  (PROTONIX ) 40 MG tablet Take 40 mg by mouth daily.      traMADol  (ULTRAM ) 50 MG tablet Take 50 mg by mouth 2 (two) times daily.       Patient Stressors:  Educational concerns   Health problems   Medication change or noncompliance   Other: Stated transitional housing concerns    Patient Strengths: Manufacturing systems engineer  Religious Affiliation   Treatment Modalities: Medication Management, Group therapy, Case management,  1 to 1 session with clinician, Psychoeducation, Recreational therapy.   Physician Treatment Plan for Primary Diagnosis: <principal problem not specified> Long Term Goal(s):     Short Term Goals:    Medication Management: Evaluate patient's response, side effects, and tolerance of medication regimen.  Therapeutic Interventions: 1 to 1 sessions, Unit Group sessions and Medication administration.  Evaluation of Outcomes: Not Progressing  Physician Treatment Plan for Secondary Diagnosis: Active Problems:   Schizophrenia, paranoid (HCC)   Dementia with behavioral disturbance (HCC)  Long Term Goal(s):     Short Term Goals:       Medication Management: Evaluate patient's response, side effects, and tolerance of medication regimen.  Therapeutic Interventions: 1 to 1 sessions, Unit Group sessions and Medication administration.  Evaluation of Outcomes: Not Progressing   RN Treatment Plan for Primary Diagnosis: <principal problem not specified> Long Term Goal(s): Knowledge of disease and therapeutic regimen to maintain health will improve  Short Term Goals: Ability to remain free from injury will improve, Ability to verbalize frustration and anger appropriately will improve, Ability to demonstrate self-control, Ability to participate in decision making will improve, Ability to verbalize feelings will improve, Ability to disclose and discuss suicidal ideas, Ability to identify and develop effective coping behaviors will improve, and Compliance with prescribed medications will improve  Medication Management: RN will administer medications as ordered by provider, will assess and evaluate patient's response and provide  education to patient for prescribed medication. RN will report any adverse and/or side effects to prescribing provider.  Therapeutic Interventions: 1 on 1 counseling sessions, Psychoeducation, Medication administration, Evaluate responses to treatment, Monitor vital signs and CBGs as ordered, Perform/monitor CIWA, COWS, AIMS and Fall Risk screenings as ordered, Perform wound care treatments as ordered.  Evaluation of Outcomes: Not Progressing   LCSW Treatment Plan for Primary Diagnosis: <principal problem not specified> Long Term Goal(s): Safe transition to appropriate next level of care at discharge, Engage patient in therapeutic group addressing interpersonal concerns.  Short Term Goals: Engage patient in aftercare planning with referrals and resources, Increase social support, Increase ability to appropriately verbalize feelings, Increase emotional regulation, Facilitate acceptance of mental health diagnosis and concerns, Facilitate patient progression through  stages of change regarding substance use diagnoses and concerns, Identify triggers associated with mental health/substance abuse issues, and Increase skills for wellness and recovery  Therapeutic Interventions: Assess for all discharge needs, 1 to 1 time with Social worker, Explore available resources and support systems, Assess for adequacy in community support network, Educate family and significant other(s) on suicide prevention, Complete Psychosocial Assessment, Interpersonal group therapy.  Evaluation of Outcomes: Not Progressing   Progress in Treatment: Attending groups: Yes and No. Participating in groups: Yes and No. Taking medication as prescribed: Yes. Toleration medication: Yes. Family/Significant other contact made: Yes, individual(s) contacted:  SPE completed with the patient's family.  Patient understands diagnosis: No. Discussing patient identified problems/goals with staff: Yes. Medical problems stabilized or  resolved: Yes. Denies suicidal/homicidal ideation: Yes. Issues/concerns per patient self-inventory: No. Other: none   New problem(s) identified: New problem(s) identified: No, Describe:  None Update 07/26/24: No changes at this time Update 08/01/24: No changes at this time  Update 08/06/24: No changes at this time 08/11/24 Update: No changes at this time.  Update 08/16/24: No changes at this time. Update 12/262025:  No changes at this time. Update 08/26/2024:  No changes at this time. Update 08/31/2024:  No changes at this time.     New Short Term/Long Term Goal(s):detox, elimination of symptoms of psychosis, medication management for mood stabilization; elimination of SI thoughts; development of comprehensive mental wellness/sobriety plan.  Update 07/26/24: No changes at this time Update 08/01/24: No changes at this time Update 08/06/24: No changes at this time  10/13/23 Update: No changes at this time. Update 08/16/24: No changes at this time.  Update 12/262025:  No changes at this time. Update 08/26/2024:  No changes at this time. Update 08/31/2024:  No changes at this time.   Patient Goals:  Make sure everything is settled. Update 07/26/24: No changes at this time Update 08/01/24: No changes at this time Update 08/06/24: No changes at this time Update 08/06/24: No changes at this time 08/11/24 Update: No changes at this time. Update 08/16/24: No changes at this time.  Update 12/262025:  No changes at this time. Update 08/26/2024:  No changes at this time. Update 08/31/2024:  No changes at this time.   Discharge Plan or Barriers: CSW to assist with the development of appropriate discharge plan.  Update 07/26/24: No changes at this time Update 08/01/24: APS Report made for pt, screened it. Update 08/06/24: FL2 sent to [facility]. Pt is not placement. [facility] and APS to assist with placement. 08/11/24 Update: CSW to continue to support [facility] in working toward placement for patient.  Update 08/16/24: No changes at this time. Update 12/262025:  No changes at this time. Update 08/26/2024:  No changes at this time. Update 08/31/2024:  Pt awarded interim guardianship at this time, guardianship court date scheduled for 09/07/2024.  [facility] report no updates on placement at this time.    Reason for Continuation of Hospitalization: Aggression Anxiety Delusions  Depression Hallucinations Suicidal ideation   Estimated Length of Stay: 1-7 days. Update 07/26/24: TBD Update 08/01/24: TBD Update 08/06/24: TBD 08/11/24 Update: TBD.  Update 08/16/24: TBD  Update 12/262025:  TBD Update 08/26/2024:  TBD Update 08/31/2024:  TBD  Last 3 [place] Suicide Severity Risk Score: Flowsheet Row Admission (Current) from 07/18/2024 in [facility] ED from 07/17/2024 in [facility] Emergency Department at [facility] ED to Hosp-Admission (Discharged) from 02/20/2024 in [facility] ICU[facility]CCU  C-SSRS RISK CATEGORY No Risk  No Risk No Risk    Last PHQ 2/9 Scores:     No data to display          Scribe for Treatment Team: [name], [place] 08/31/2024 4:08 PM

## 2024-08-31 NOTE — Group Note (Signed)
 Recreation Therapy Group Note   Group Topic:Stress Management  Group Date: 08/31/2024 Start Time: 1415 End Time: 1440 Facilitators: Celestia Jeoffrey BRAVO, LRT, CTRS Location: Dayroom  Group Description: Meditation. LRT asks patients their current level of stress/anxiety from 1-10, with 10 being the highest. LRT educated on the benefits of meditation and relaxation techniques, and how it can apply to everyday life post-discharge. LRT and pt's followed along to an audio script of a guided meditation video. LRT asked pt their level of stress and anxiety once the prompt was finished. LRT facilitated post-activity processing to gain feedback on session.   Goal Area(s) Addressed:  Patient will practice using relaxation technique. Patient will identify a new coping skill.  Patient will follow multistep directions to reduce anxiety and stress.   Affect/Mood: Appropriate   Participation Level: Minimal    Clinical Observations/Individualized Feedback: Miguel [name] came late to group. Pt completed some of the exercises as prompted.   Plan: Continue to engage patient in RT group sessions 2-3x/week.   Jeoffrey BRAVO Celestia, LRT, CTRS 08/31/2024 4:51 PM

## 2024-08-31 NOTE — Group Note (Signed)
 Date:  08/31/2024 Time:  8:58 PM  Group Topic/Focus:  Wrap-Up Group:   The focus of this group is to help patients review their daily goal of treatment and discuss progress on daily workbooks.    Participation Level:  Active  Participation Quality:  Appropriate  Affect:  Appropriate  Cognitive:  Alert  Insight: Appropriate  Engagement in Group:  Engaged  Modes of Intervention:  Discussion  Additional Comments:    Miguel [name] 08/31/2024, 8:58 PM

## 2024-08-31 NOTE — Progress Notes (Signed)
" °   08/31/24 0200  Psych Admission Type (Psych Patients Only)  Admission Status Involuntary  Psychosocial Assessment  Patient Complaints None  Eye Contact Fair  Facial Expression Animated  Affect Irritable  Speech Slurred  Interaction Assertive  Motor Activity Slow  Appearance/Hygiene Unremarkable  Behavior Characteristics Guarded  Mood Irritable  Aggressive Behavior  Effect No apparent injury  Thought Process  Coherency Tangential  Content Preoccupation  Delusions None reported or observed  Perception WDL  Hallucination None reported or observed  Judgment Poor  Confusion Mild  Danger to Self  Current suicidal ideation? Denies  Danger to Others  Danger to Others None reported or observed    "

## 2024-08-31 NOTE — Group Note (Unsigned)
 Date:  08/31/2024 Time:  11:59 AM  Group Topic/Focus:  Goals Group:   The focus of this group is to help patients establish daily goals to achieve during treatment and discuss how the patient can incorporate goal setting into their daily lives to aide in recovery.     Participation Level:  {BHH PARTICIPATION OZCZO:77735}  Participation Quality:  {BHH PARTICIPATION QUALITY:22265}  Affect:  {BHH AFFECT:22266}  Cognitive:  {BHH COGNITIVE:22267}  Insight: {BHH Insight2:20797}  Engagement in Group:  {BHH ENGAGEMENT IN HMNLE:77731}  Modes of Intervention:  {BHH MODES OF INTERVENTION:22269}  Additional Comments:  ***  Maglione,Yanet Balliet E 08/31/2024, 11:59 AM

## 2024-08-31 NOTE — Group Note (Signed)
 Physical/Occupational Therapy Group Note  Group Topic: Functional, Dynamic Balance   Group Date: 08/31/2024 Start Time: 1300 End Time: 1330 Facilitators: Mirakle Tomlin, Alm Hamilton, PT   Group Description: Group discussed impact of balance on safety and independence with functional tasks.  Identified and discussed any self-perceived balance deficits to personalize information.  Discussed and reviewed strategies to address/improve balance deficits: use of assist devices, activity pacing/energy conservation, environment/home safety modifications, focusing attention/minimizing distraction.  Reviewed and participated with standing LE therex designed to target dynamic balance reactions and LE strength/stability; provided handouts with HEP to be utilized outside of group time as appropriate.  Allowed time for questions and further discussion on any balance or mobility concerns/needs.  Therapeutic Goal(s):  Identify and discuss any individual balance deficits and functional implications. Identify and discuss any environmental/home safety modifications that can optimize balance and safety for mobility within the home. Demonstrate understanding and performance of standing therex designed to target dynamic balance deficits.  Individual Participation: Pt entered the session around 1:15 pm and participated minimally with the activity portion of the session but did not participate with discussion.  Pt was noted to drift off to sleep frequently during the session.    Participation Level: Minimal   Participation Quality: Minimal Cues   Behavior: Lethargic   Speech/Thought Process: Did not participate in discussion    Affect/Mood: Flat   Insight: Difficult to assess due to limited participation   Judgement: Per above    Modes of Intervention: Activity, Discussion, and Education  Patient Response to Interventions:  Disengaged   Plan: Continue to engage patient in PT/OT groups 1 - 2x/week.  CHARM Hamilton Bertin  PT, DPT 08/31/2024, 1:54 PM

## 2024-08-31 NOTE — Consult Note (Signed)
 WOC Nurse Consult Note: Reason for Consult: PI Stage 3 to sacrum. Pt wants a different dressing, bother him have something moist in his wound. Wound type: PI Stage 3 to sacrum. Pressure Injury POA: Yes Measurement: 4 cm x 3 cm x 0.5 cm. Wound bed: 80% beefy red, 20% yellow. Drainage (amount, consistency, odor) Minimum amount, serous, no odor. Periwound: maceration 5 to 8 o'clock. Epibole edges. Dressing procedure/placement/frequency: Cleanse the wound with saline, pat dry. Apply a single layer of Alginate [phone] to the wound bed, top with foam dressing, change every other day.    WOC team will not plan to follow further. Please reconsult if further assistance is needed. Thank-you,  Lela Holm MSN, RN, CWCN, CNS.  (Phone [phone])

## 2024-09-01 DIAGNOSIS — F03918 Unspecified dementia, unspecified severity, with other behavioral disturbance: Secondary | ICD-10-CM | POA: Diagnosis not present

## 2024-09-01 DIAGNOSIS — F200 Paranoid schizophrenia: Secondary | ICD-10-CM | POA: Diagnosis not present

## 2024-09-01 NOTE — Group Note (Signed)
 Date:  09/01/2024 Time:  9:07 PM  Group Topic/Focus:  Wrap-Up Group:   The focus of this group is to help patients review their daily goal of treatment and discuss progress on daily workbooks.    Participation Level:  Active  Participation Quality:  Attentive  Affect:  Appropriate  Cognitive:  Appropriate  Insight: Appropriate  Engagement in Group:  Engaged  Modes of Intervention:  Discussion  Additional Comments:    Miguel [name] Getting 09/01/2024, 9:07 PM

## 2024-09-01 NOTE — Progress Notes (Signed)
 Patient was pleasant and cooperative. Took medications without issues. Denies anxiety, depression, SI/ HI/ AVH. PRN Oxycodone  was administered for pain. Slept for about 7 hrs.    08/31/24 2100  Psych Admission Type (Psych Patients Only)  Admission Status Involuntary  Psychosocial Assessment  Patient Complaints None  Eye Contact Fair  Facial Expression Animated  Affect Appropriate to circumstance  Speech Slurred  Interaction Assertive  Motor Activity Slow  Appearance/Hygiene Unremarkable  Behavior Characteristics Cooperative;Calm  Mood Pleasant  Thought Process  Coherency Tangential  Content Preoccupation  Delusions None reported or observed  Perception WDL  Hallucination None reported or observed  Judgment Poor  Confusion Mild  Danger to Self  Current suicidal ideation? Denies  Agreement Not to Harm Self Yes  Description of Agreement verbal  Danger to Others  Danger to Others None reported or observed

## 2024-09-01 NOTE — Group Note (Signed)
 Recreation Therapy Group Note   Group Topic:Animal Assisted Therapy   Group Date: 09/01/2024 Start Time: 1020 End Time: 1045 Facilitators: Celestia Jeoffrey BRAVO, LRT, CTRS Location: Dayroom  Group Description: AAA. Animal-Assisted Activity provides opportunities for motivational, educational, therapeutic and/or recreational benefits to enhance quality of life. Miguel [name] and Rollo visited the unit to interact with patients.   Goal Areas Addressed:  Reduced anxiety and stress Improved mood Increased social interaction Enhanced communication skills Reduced loneliness and isolation Improved emotional regulation   Affect/Mood: Appropriate   Participation Level: Active and Engaged   Participation Quality: Independent   Behavior: Calm and Cooperative   Speech/Thought Process: Loose association   Insight: Fair   Judgement: Fair    Modes of Intervention: Activity   Patient Response to Interventions:  Attentive and Interested    Education Outcome:  In group clarification offered    Clinical Observations/Individualized Feedback: [name] was active in their participation of session activities and group discussion. Pt interacted well with LRT and peers duration of session.    Plan: Continue to engage patient in RT group sessions 2-3x/week.   Jeoffrey BRAVO Celestia, LRT, CTRS 09/01/2024 11:58 AM

## 2024-09-01 NOTE — Group Note (Signed)
 LCSW Group Therapy Note  Group Date: 09/01/2024 Start Time: 1330 End Time: 1400   Type of Therapy and Topic:  Group Therapy - Healthy vs Unhealthy Coping Skills  Participation Level:  Did Not Attend   Description of Group The focus of this group was to determine what unhealthy coping techniques typically are used by group members and what healthy coping techniques would be helpful in coping with various problems. Patients were guided in becoming aware of the differences between healthy and unhealthy coping techniques. Patients were asked to identify 2-3 healthy coping skills they would like to learn to use more effectively.  Therapeutic Goals Patients learned that coping is what human beings do all day long to deal with various situations in their lives Patients defined and discussed healthy vs unhealthy coping techniques Patients identified their preferred coping techniques and identified whether these were healthy or unhealthy Patients determined 2-3 healthy coping skills they would like to become more familiar with and use more often. Patients provided support and ideas to each other   Summary of Patient Progress:  X   Therapeutic Modalities Cognitive Behavioral Therapy Motivational Interviewing  [name], LCSWA 09/01/2024  2:25 PM

## 2024-09-01 NOTE — Progress Notes (Signed)
 [facility] MD Progress Note  09/01/2024 8:07 PM Miguel [name]  MRN:  [id number]  Miguel [name] is a 74 year old male with a past psychiatric history significant for schizophrenia who presents under involuntary commitment initiated by family due to medication non-adherence, increasing paranoia, and behavioral disorganization. 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 Brother reports multiple episodes of patient whispering through the night, stating people were talking about him and planning to harm him. Patient was advised by his ACT Team to come to the ED; he refused, prompting the family to pursue IVC.Patient is admitted to [facility] unit with Q15 min safety monitoring. Multidisciplinary team approach is offered. Medication management; group/milieu therapy is offered.  Subjective:  Chart reviewed, case discussed in multidisciplinary meeting, patient seen during rounds.  Patient is noted to be walking in the hallways.  He remains to display disorganized thought process.  He is unable to answer the orientation questions.  Patient was educated on that dressing changes on his sacral wound.  Patient did acknowledge the wound care nurse coming to clean it.  He is unable to comprehend the treatment options and the medication choices.  He is unable to answer the questions related to hallucinations and SI/HI.  He remains paranoid about his family. Past Psychiatric History: see h&P Family History:  Family History  Family history unknown: Yes   Social History:  Social History   Substance and  Sexual Activity  Alcohol Use No     Social History   Substance and Sexual Activity  Drug Use No    Social History   Socioeconomic History   Marital status: Divorced    Spouse name: Not on file   Number of children: Not on file   Years of education: Not on file   Highest education level: Not on file  Occupational History   Not on file  Tobacco Use   Smoking status: Every Day    Current packs/day: 0.50    Average packs/day: 0.5 packs/day for 46.0 years (23.0 ttl pk-yrs)    Types: Cigarettes    Start date: 4   Smokeless tobacco: Never  Vaping Use   Vaping status: Never Used  Substance and Sexual Activity   Alcohol use: No   Drug use: No   Sexual activity: Not on file  Other Topics Concern   Not on file  Social History Narrative   Not on file   Social Drivers of Health   Tobacco Use: High Risk (07/18/2024)   Patient History    Smoking Tobacco Use: Every Day    Smokeless Tobacco Use: Never    Passive Exposure: Not on file  Financial Resource Strain: Not on file  Food Insecurity: Patient Unable To Answer (07/18/2024)   Epic    Worried About Programme Researcher, Broadcasting/film/video in the Last Year: Patient unable to answer    Ran Out of Food in the Last Year: Patient unable to answer  Transportation Needs: Unmet Transportation Needs (07/18/2024)   Epic    Lack of Transportation (Medical): Yes    Lack of Transportation (Non-Medical): Yes  Physical Activity: Not on file  Stress: Not on file  Social Connections: Patient Unable To Answer (07/18/2024)  Social Connection and Isolation Panel    Frequency of Communication with Friends and Family: Patient unable to answer    Frequency of Social Gatherings with Friends and Family: Patient unable to answer    Attends Religious Services: Patient unable to answer    Active Member of Clubs or Organizations: Patient unable to answer    Attends Banker Meetings: Patient unable to answer    Marital Status: Patient unable to answer   Depression (PHQ2-9): Not on file  Alcohol Screen: Low Risk (07/18/2024)   Alcohol Screen    Last Alcohol Screening Score (AUDIT): 0  Housing: Unknown (07/18/2024)   Epic    Unable to Pay for Housing in the Last Year: Patient unable to answer    Number of Times Moved in the Last Year: 0    Homeless in the Last Year: Patient declined  Utilities: Patient Unable To Answer (07/18/2024)   Epic    Threatened with loss of utilities: Patient unable to answer  Health Literacy: Not on file   Past Medical History:  Past Medical History:  Diagnosis Date   Biventricular failure (HCC)    CVA (cerebral vascular accident) (HCC)    Polysubstance use disorder    Schizophrenia (HCC)     Past Surgical History:  Procedure Laterality Date   APPENDECTOMY     TRACHEOSTOMY TUBE PLACEMENT N/A 03/04/2024   Procedure: CREATION, TRACHEOSTOMY;  Surgeon: [name] Massie RAMAN, MD;  Location: [facility] ORS;  Service: ENT;  Laterality: N/A;    Current Medications: Current Facility-Administered Medications  Medication Dose Route Frequency Provider Last Rate Last Admin   acetaminophen  (TYLENOL ) tablet 650 mg  650 mg Oral Q6H PRN Hampton, Tracie B, NP   650 mg at 08/30/24 1928   alum & mag hydroxide-simeth (MAALOX/MYLANTA) 200-200-20 MG/5ML suspension 30 mL  30 mL Oral Q4H PRN Hampton, Tracie B, NP   30 mL at 08/05/24 0306   amiodarone  (PACERONE ) tablet 400 mg  400 mg Oral Daily [name], [name], MD   400 mg at 09/01/24 0900   ARIPiprazole  ER (ABILIFY  MAINTENA) injection 400 mg  400 mg Intramuscular Q28 days Katheline Brendlinger, MD   400 mg at 08/24/24 9065   benzocaine  (ORAJEL) 10 % mucosal gel   Mouth/Throat TID PRN [name], [name], MD       divalproex  (DEPAKOTE  ER) 24 hr tablet 1,000 mg  1,000 mg Oral QHS Zeinab Rodwell, MD   1,000 mg at 08/31/24 2103   docusate sodium  (COLACE) capsule 100 mg  100 mg Oral Daily Vladimir Lenhoff, MD   100 mg at 09/01/24 0900   feeding supplement (ENSURE PLUS HIGH PROTEIN) liquid 237 mL  237 mL  Oral TID BM [name], [name], MD   237 mL at 09/01/24 9096   hydrOXYzine  (ATARAX ) tablet 25 mg  25 mg Oral TID PRN Hampton, Tracie B, NP   25 mg at 08/30/24 2132   magnesium  hydroxide (MILK OF MAGNESIA) suspension 30 mL  30 mL Oral Daily PRN Hampton, Tracie B, NP   30 mL at 08/08/24 2233   memantine  (NAMENDA ) tablet 10 mg  10 mg Oral Daily Keone Kamer, MD   10 mg at 09/01/24 9141   metoprolol  tartrate (LOPRESSOR ) tablet 12.5 mg  12.5 mg Oral BID [name], [name], MD   12.5 mg at 08/31/24 2104   naproxen  (NAPROSYN ) tablet 250 mg  250 mg Oral BID WC Deisy Ozbun, MD   250 mg at 09/01/24 0901   OLANZapine  (ZYPREXA ) injection 5 mg  5 mg Intramuscular TID PRN Hampton, Tracie B, NP       OLANZapine  zydis (ZYPREXA ) disintegrating tablet 5 mg  5 mg Oral TID PRN Hampton, Tracie B, NP   5 mg at 08/04/24 1129   oxyCODONE  (Oxy IR/ROXICODONE ) immediate release tablet 5 mg  5 mg Oral Q4H PRN Dorinda Drue DASEN, MD   5 mg at 08/31/24 2104   pantoprazole  (PROTONIX ) EC tablet 40 mg  40 mg Oral Daily [name], [name], MD   40 mg at 09/01/24 0859   sodium chloride  (OCEAN) 0.65 % nasal spray 1 spray  1 spray Each Nare PRN Donnelly Mellow, MD   1 spray at 08/18/24 1459   traZODone  (DESYREL ) tablet 50 mg  50 mg Oral QHS PRN Hampton, Tracie B, NP   50 mg at 08/31/24 2103    Lab Results:  No results found for this or any previous visit (from the past 48 hours).    Blood Alcohol level:  Lab Results  Component Value Date   [facility] <15 07/17/2024   ETH <15 02/20/2024    Metabolic Disorder Labs: Lab Results  Component Value Date   HGBA1C 5.4 08/12/2024   MPG 108.28 08/12/2024   MPG 119.76 07/14/2018   No results found for: PROLACTIN Lab Results  Component Value Date   CHOL 139 08/12/2024   TRIG 73 08/12/2024   HDL 52 08/12/2024   CHOLHDL 2.7 08/12/2024   VLDL 15 08/12/2024   LDLCALC 72 08/12/2024   LDLCALC 119 (H) 07/13/2018    Physical Findings: AIMS:  , ,  ,  ,    CIWA:    COWS:       Psychiatric Specialty Exam:  Presentation  General Appearance: Appropriate for Environment  Eye Contact:Fleeting  Speech:Normal Rate  Speech Volume:Decreased    Mood and Affect  Mood:Anxious  Affect:Flat   Thought Process  Thought Processes:circumstantial Orientation:Partial  Thought Content:discharge focused  Hallucinations:denies  Ideas of Reference:Delusions At baseline Suicidal Thoughts:denies  Homicidal Thoughts:denies   Sensorium  Memory:Immediate Poor; Recent Poor; Remote Poor  Judgment:Impaired  Insight:None   Executive Functions  Concentration:Poor  Attention Span:Poor  Recall:Poor  Fund of Knowledge:Poor  Language:Fair   Psychomotor Activity  Psychomotor Activity:No data recorded  Musculoskeletal: Strength & Muscle Tone: within normal limits Gait & Station: normal Assets  Assets:Communication Skills; Desire for Improvement; Social Support    Physical Exam: Physical Exam Vitals and nursing note reviewed.    ROS Blood pressure (!) 118/57, pulse 99, temperature (!) 97.5 F (36.4 C), resp. rate 14, height 6' 1 (1.854 m), weight 81.2 kg, SpO2 100%. Body mass index is 23.62 kg/m.  Diagnosis: Active Problems:   Schizophrenia, paranoid (HCC)   Dementia with behavioral disturbance (HCC)   PLAN: Safety and Monitoring:  -- Involuntary admission to inpatient psychiatric unit for safety, stabilization and treatment  -- Daily contact with patient to assess and evaluate symptoms and progress in treatment  -- Patient's case to be discussed in multi-disciplinary team meeting  -- Observation Level : q15 minute checks  -- Vital signs:  q12 hours  -- Precautions: suicide, elopement, and assault -- Encouraged patient to participate in unit milieu and in scheduled group therapies  2. Psychiatric Treatment:  Scheduled Medications:  Discontinued oral Abilify  LAY Abilify  Maintena 400 mg q4 weeks given on 07/27/24,  08/24/2024  Cogentin  1mg  one dose given for EPS Depakote  ER increased to 1000 mg to help with mood stabilization as he was receiving multiple as needed antipsychotics for agitation  --  The risks/benefits/side-effects/alternatives to this medication were discussed in detail with the patient and time was given for questions. The patient consents to medication trial.  3. Medical Issues Being Addressed:  [facility] consulted for wound care management   Assessment and Plan:   Sacral Decubitus Ulcer Full thickness decubitus ulcer in setting of recent extended hospitalization, discharged [facility] 14[facility] Wound looks pretty good overall WOC on board we appreciate input   Cognitive Deficit Concern for anoxic brain injury at discharge 02/2024 Delirium precautions   Peripheral Vascular Disease Legs with features c/w peripheral vascular disease, diminished DP pulses Would benefit from outpatient ABI's   HFrEF Euvolemic at this time Continue home medications   Hx Atrial Flutter Not on any anticoagulation at this time EKG with NSR Continue amiodarone   Hypertension Noted, adjust meds gradually after med rec complete   Tooth pain/Infection - Added: Augmentin  along with Orajel BP -better today no longer hypotensive-Will continue reduced dose of metoprolol  12.5 bid.   4. Discharge Planning:   -- Social work and case management to assist with discharge planning and identification of hospital follow-up needs prior to discharge  -- Estimated LOS: 3-4 days  Yanelly Cantrelle, MD 09/01/2024, 8:07 PM

## 2024-09-01 NOTE — Plan of Care (Signed)
" °  Problem: Activity: Goal: Interest or engagement in activities will improve Outcome: Progressing   Problem: Coping: Goal: Ability to verbalize frustrations and anger appropriately will improve Outcome: Progressing   Problem: Education: Goal: Emotional status will improve Outcome: Progressing Goal: Mental status will improve Outcome: Progressing   Problem: Activity: Goal: Sleeping patterns will improve Outcome: Progressing   "

## 2024-09-01 NOTE — Group Note (Signed)
 Date:  09/01/2024 Time:  4:01 PM  Group Topic/Focus:  Making Healthy Choices:   The focus of this group is to help patients identify negative/unhealthy choices they were using prior to admission and identify positive/healthier coping strategies to replace them upon discharge.    Participation Level:  Minimal  Participation Quality:  Appropriate  Affect:  Appropriate  Cognitive:  Appropriate  Insight: Appropriate  Engagement in Group:  Limited  Modes of Intervention:  Discussion  Additional Comments:  N/A  [name] 09/01/2024, 4:01 PM

## 2024-09-01 NOTE — Progress Notes (Incomplete)
(  Sleep Hours) - (Any PRNs that were needed, meds refused, or side effects to meds)- Oxycodone : Trazodone :  (Any disturbances and when (visitation, over night)-None reported or observed (Concerns raised by the patient)- None reported or observed (SI/HI/AVH)- Denies

## 2024-09-02 DIAGNOSIS — F200 Paranoid schizophrenia: Secondary | ICD-10-CM | POA: Diagnosis not present

## 2024-09-02 DIAGNOSIS — F03918 Unspecified dementia, unspecified severity, with other behavioral disturbance: Secondary | ICD-10-CM | POA: Diagnosis not present

## 2024-09-02 LAB — VALPROIC ACID LEVEL: Valproic Acid Lvl: 57 ug/mL (ref 50–100)

## 2024-09-02 NOTE — Progress Notes (Signed)
" °   09/02/24 2100  Psych Admission Type (Psych Patients Only)  Admission Status Involuntary  Psychosocial Assessment  Patient Complaints None  Eye Contact Fair  Facial Expression Animated  Affect Appropriate to circumstance  Speech Soft;Slurred  Interaction Assertive  Motor Activity Slow  Appearance/Hygiene Unremarkable  Behavior Characteristics Cooperative  Mood Pleasant  Thought Process  Coherency Tangential  Content Preoccupation  Delusions None reported or observed  Perception WDL  Hallucination None reported or observed  Judgment Poor  Confusion Mild  Danger to Self  Current suicidal ideation? Denies  Agreement Not to Harm Self Yes  Description of Agreement Verbal  Danger to Others  Danger to Others None reported or observed    "

## 2024-09-02 NOTE — Progress Notes (Signed)
 Patient presents: Patient is overall cooperative and med compliant. Patient denies current pain except for itchiness at wound site. Wound measured and documented.   SI/HI/AVH: Denies   Plan: Denies   Groups attended: 1/3   Appetite: Adequate. Attended meals.   Sleep: No sleep disturbances reported.   PRNS: N/A   Disturbances: No disturbances. Patient remains cooperative in milieu.    Questions/concerns: Patient noted to have a low diastolic BP and BP medication was held. Provider notified during progression meeting. No further issues or concerns.

## 2024-09-02 NOTE — Group Note (Signed)
 Date:  09/02/2024 Time:  5:00 PM  Group Topic/Focus:  Coping With Mental Health Crisis:   The purpose of this group is to help patients identify strategies for coping with mental health crisis.  Group discusses possible causes of crisis and ways to manage them effectively. Healthy Communication:   The focus of this group is to discuss communication, barriers to communication, as well as healthy ways to communicate with others. Making Healthy Choices:   The focus of this group is to help patients identify negative/unhealthy choices they were using prior to admission and identify positive/healthier coping strategies to replace them upon discharge.  A group was facilitated focusing on fun, critical-thinking activities. Patients participated in games such as Family Feud and What Would You Do? scenarios. The group encouraged patients to identify positive aspects of situations while engaging in problem-solving and decision-making skills. Patients were given opportunities to share ideas, collaborate, and practice healthy coping strategies in a supportive environment.  Participation Level:  Did Not Attend  Participation Quality:    Affect:    Cognitive:    Insight:   Engagement in Group:    Modes of Intervention:    Additional Comments:    Radiance Deady L Aster Screws 09/02/2024, 5:00 PM

## 2024-09-02 NOTE — Plan of Care (Signed)
   Problem: Health Behavior/Discharge Planning: Goal: Compliance with treatment plan for underlying cause of condition will improve Outcome: Progressing   Problem: Safety: Goal: Periods of time without injury will increase Outcome: Progressing

## 2024-09-02 NOTE — Progress Notes (Signed)
 [facility] MD Progress Note  09/02/2024 12:32 PM Miguel [name]  MRN:  [id number]  Miguel [name]. Miguel [name] is a 74 year old male with a past psychiatric history significant for schizophrenia who presents under involuntary commitment initiated by family due to medication non-adherence, increasing paranoia, and behavioral disorganization. 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 Brother reports multiple episodes of patient whispering through the night, stating people were talking about him and planning to harm him. Patient was advised by his ACT Team to come to the ED; he refused, prompting the family to pursue IVC.Patient is admitted to [facility] unit with Q15 min safety monitoring. Multidisciplinary team approach is offered. Medication management; group/milieu therapy is offered.   Subjective:  Chart reviewed, case discussed in multidisciplinary meeting, patient seen during rounds.   Patient is noted to be resting in bed.  He offers no complaints.  He denies SI/HI/plan and denies hallucinations.  Patient continues to talk nonsensical and unrelated things but displays paranoid delusions about people trying to upset him, take his things away.  Per nursing patient is not agitated and is taking his medications. Past Psychiatric History: see h&P Family History:  Family History  Family history unknown: Yes   Social History:  Social History   Substance and Sexual Activity  Alcohol Use No     Social History   Substance and Sexual Activity  Drug Use No    Social History   Socioeconomic  History   Marital status: Divorced    Spouse name: Not on file   Number of children: Not on file   Years of education: Not on file   Highest education level: Not on file  Occupational History   Not on file  Tobacco Use   Smoking status: Every Day    Current packs/day: 0.50    Average packs/day: 0.5 packs/day for 46.0 years (23.0 ttl pk-yrs)    Types: Cigarettes    Start date: 36   Smokeless tobacco: Never  Vaping Use   Vaping status: Never Used  Substance and Sexual Activity   Alcohol use: No   Drug use: No   Sexual activity: Not on file  Other Topics Concern   Not on file  Social History Narrative   Not on file   Social Drivers of Health   Tobacco Use: High Risk (07/18/2024)   Patient History    Smoking Tobacco Use: Every Day    Smokeless Tobacco Use: Never    Passive Exposure: Not on file  Financial Resource Strain: Not on file  Food Insecurity: Patient Unable To Answer (07/18/2024)   Epic    Worried About Programme Researcher, Broadcasting/film/video in the Last Year: Patient unable to answer    Ran Out of Food in the Last Year: Patient unable to answer  Transportation Needs: Unmet Transportation Needs (07/18/2024)   Epic    Lack of Transportation (Medical): Yes    Lack of Transportation (Non-Medical): Yes  Physical Activity: Not on file  Stress: Not on file  Social Connections: Patient Unable To Answer (07/18/2024)   Social Connection and Isolation Panel    Frequency of Communication with Friends and Family: Patient unable to answer    Frequency of Social Gatherings  with Friends and Family: Patient unable to answer    Attends Religious Services: Patient unable to answer    Active Member of Clubs or Organizations: Patient unable to answer    Attends Club or Organization Meetings: Patient unable to answer    Marital Status: Patient unable to answer  Depression (PHQ2-9): Not on file  Alcohol Screen: Low Risk (07/18/2024)   Alcohol Screen    Last Alcohol Screening Score (AUDIT): 0   Housing: Unknown (07/18/2024)   Epic    Unable to Pay for Housing in the Last Year: Patient unable to answer    Number of Times Moved in the Last Year: 0    Homeless in the Last Year: Patient declined  Utilities: Patient Unable To Answer (07/18/2024)   Epic    Threatened with loss of utilities: Patient unable to answer  Health Literacy: Not on file   Past Medical History:  Past Medical History:  Diagnosis Date   Biventricular failure (HCC)    CVA (cerebral vascular accident) (HCC)    Polysubstance use disorder    Schizophrenia (HCC)     Past Surgical History:  Procedure Laterality Date   APPENDECTOMY     TRACHEOSTOMY TUBE PLACEMENT N/A 03/04/2024   Procedure: CREATION, TRACHEOSTOMY;  Surgeon: [name] Massie RAMAN, MD;  Location: [facility] ORS;  Service: ENT;  Laterality: N/A;    Current Medications: Current Facility-Administered Medications  Medication Dose Route Frequency Provider Last Rate Last Admin   acetaminophen  (TYLENOL ) tablet 650 mg  650 mg Oral Q6H PRN Hampton, Tracie B, NP   650 mg at 08/30/24 1928   alum & mag hydroxide-simeth (MAALOX/MYLANTA) 200-200-20 MG/5ML suspension 30 mL  30 mL Oral Q4H PRN Hampton, Tracie B, NP   30 mL at 08/05/24 0306   amiodarone  (PACERONE ) tablet 400 mg  400 mg Oral Daily [name], [name], MD   400 mg at 09/02/24 0825   ARIPiprazole  ER (ABILIFY  MAINTENA) injection 400 mg  400 mg Intramuscular Q28 days Mcclain Shall, MD   400 mg at 08/24/24 9065   benzocaine  (ORAJEL) 10 % mucosal gel   Mouth/Throat TID PRN [name], [name], MD       divalproex  (DEPAKOTE  ER) 24 hr tablet 1,000 mg  1,000 mg Oral QHS Sharlyne Koeneman, MD   1,000 mg at 09/01/24 2100   docusate sodium  (COLACE) capsule 100 mg  100 mg Oral Daily Phoebe Marter, MD   100 mg at 09/02/24 0826   feeding supplement (ENSURE PLUS HIGH PROTEIN) liquid 237 mL  237 mL Oral TID BM [name], [name], MD   237 mL at 09/02/24 0830   hydrOXYzine  (ATARAX ) tablet 25 mg  25 mg Oral TID PRN Hampton, Tracie B,  NP   25 mg at 08/30/24 2132   magnesium  hydroxide (MILK OF MAGNESIA) suspension 30 mL  30 mL Oral Daily PRN Hampton, Tracie B, NP   30 mL at 08/08/24 2233   memantine  (NAMENDA ) tablet 10 mg  10 mg Oral Daily Jameil Whitmoyer, MD   10 mg at 09/02/24 0825   metoprolol  tartrate (LOPRESSOR ) tablet 12.5 mg  12.5 mg Oral BID [name], [name], MD   12.5 mg at 09/01/24 2101   naproxen  (NAPROSYN ) tablet 250 mg  250 mg Oral BID WC Nhyira Leano, MD   250 mg at 09/02/24 0825   OLANZapine  (ZYPREXA ) injection 5 mg  5 mg Intramuscular TID PRN Hampton, Tracie B, NP       OLANZapine  zydis (ZYPREXA ) disintegrating tablet 5 mg  5 mg  Oral TID PRN Hampton, Tracie B, NP   5 mg at 08/04/24 1129   oxyCODONE  (Oxy IR/ROXICODONE ) immediate release tablet 5 mg  5 mg Oral Q4H PRN Dorinda Homans T, MD   5 mg at 09/01/24 2102   pantoprazole  (PROTONIX ) EC tablet 40 mg  40 mg Oral Daily [name], [name], MD   40 mg at 09/02/24 9173   sodium chloride  (OCEAN) 0.65 % nasal spray 1 spray  1 spray Each Nare PRN Donnelly Mellow, MD   1 spray at 08/18/24 1459   traZODone  (DESYREL ) tablet 50 mg  50 mg Oral QHS PRN Hampton, Tracie B, NP   50 mg at 09/01/24 2101    Lab Results:  Results for orders placed or performed during the hospital encounter of 07/18/24 (from the past 48 hours)  Valproic acid  level     Status: None   Collection Time: 09/02/24  6:10 AM  Result Value Ref Range   Valproic Acid  Lvl 57 50 - 100 ug/mL    Comment: Performed at [facility], [address] [place], [place] [address]      Blood Alcohol level:  Lab Results  Component Value Date   [name] <15 07/17/2024   ETH <15 02/20/2024    Metabolic Disorder Labs: Lab Results  Component Value Date   HGBA1C 5.4 08/12/2024   MPG 108.28 08/12/2024   MPG 119.76 07/14/2018   No results found for: PROLACTIN Lab Results  Component Value Date   CHOL 139 08/12/2024   TRIG 73 08/12/2024   HDL 52 08/12/2024   CHOLHDL 2.7 08/12/2024   VLDL 15  08/12/2024   LDLCALC 72 08/12/2024   LDLCALC 119 (H) 07/13/2018    Physical Findings: AIMS:  , ,  ,  ,    CIWA:    COWS:      Psychiatric Specialty Exam:  Presentation  General Appearance: Appropriate for Environment  Eye Contact:Fleeting  Speech:Normal Rate  Speech Volume:Decreased    Mood and Affect  Mood:Anxious  Affect:Flat   Thought Process  Thought Processes:circumstantial Orientation:Partial  Thought Content:discharge focused  Hallucinations:denies  Ideas of Reference:Delusions At baseline Suicidal Thoughts:denies  Homicidal Thoughts:denies   Sensorium  Memory:Immediate Poor; Recent Poor; Remote Poor  Judgment:Impaired  Insight:None   Executive Functions  Concentration:Poor  Attention Span:Poor  Recall:Poor  Fund of Knowledge:Poor  Language:Fair   Psychomotor Activity  Psychomotor Activity:No data recorded  Musculoskeletal: Strength & Muscle Tone: within normal limits Gait & Station: normal Assets  Assets:Communication Skills; Desire for Improvement; Social Support    Physical Exam: Physical Exam Vitals and nursing note reviewed.    ROS Blood pressure (!) 103/56, pulse 64, temperature (!) 97.5 F (36.4 C), resp. rate 17, height 6' 1 (1.854 m), weight 81.2 kg, SpO2 98%. Body mass index is 23.62 kg/m.  Diagnosis: Active Problems:   Schizophrenia, paranoid (HCC)   Dementia with behavioral disturbance (HCC)   PLAN: Safety and Monitoring:  -- Involuntary admission to inpatient psychiatric unit for safety, stabilization and treatment  -- Daily contact with patient to assess and evaluate symptoms and progress in treatment  -- Patient's case to be discussed in multi-disciplinary team meeting  -- Observation Level : q15 minute checks  -- Vital signs:  q12 hours  -- Precautions: suicide, elopement, and assault -- Encouraged patient to participate in unit milieu and in scheduled group therapies  2. Psychiatric Treatment:   Scheduled Medications:  Discontinued oral Abilify  LAY Abilify  Maintena 400 mg q4 weeks given on 07/27/24, 08/24/2024  Cogentin  1mg  one dose given for EPS Depakote  ER increased to 1000 mg to help with mood stabilization  Depakote  levels 09/02/24: 57  -- The risks/benefits/side-effects/alternatives to this medication were discussed in detail with the patient and time was given for questions. The patient consents to medication trial.  3. Medical Issues Being Addressed:  [name] consulted for wound care management   Assessment and Plan:   Sacral Decubitus Ulcer Full thickness decubitus ulcer in setting of recent extended hospitalization, discharged from [name] Nov 14th Wound looks pretty good overall WOC on board we appreciate input   Cognitive Deficit Concern for anoxic brain injury at discharge 02/2024 Delirium precautions   Peripheral Vascular Disease Legs with features c/w peripheral vascular disease, diminished DP pulses Would benefit from outpatient ABI's   HFrEF Euvolemic at this time Continue home medications   Hx Atrial Flutter Not on any anticoagulation at this time EKG with NSR Continue amiodarone   Hypertension Noted, adjust meds gradually after med rec complete   Tooth pain/Infection - Added: Augmentin  along with Orajel BP -better today no longer hypotensive-Will continue reduced dose of metoprolol  12.5 bid.   4. Discharge Planning:   -- Social work and case management to assist with discharge planning and identification of hospital follow-up needs prior to discharge  -- Estimated LOS: 3-4 days  Lindzy Rupert, MD 09/02/2024, 12:32 PM

## 2024-09-02 NOTE — Plan of Care (Signed)
 Patient alert and oriented. Denies SI, HI, AVH. Pain 8/10 sacral wound. Pt also disclose concerns w/stomach area. Pain med administered w/ scheduled medications per MAR. Support and encouragement provided.  Routine safety checks conducted every 15 minutes.  Patient informed to notify staff with problems or concerns. No adverse drug reactions noted. Patient verbally contracts for safety at this time. Patient interacts well with others on the unit.  Patient remains safe at this time.  Problem: Education: Goal: Knowledge of [facility] Education information/materials will improve Outcome: Progressing   Problem: Coping: Goal: Ability to verbalize frustrations and anger appropriately will improve Outcome: Progressing   Problem: Health Behavior/Discharge Planning: Goal: Identification of resources available to assist in meeting health care needs will improve Outcome: Progressing Goal: Compliance with treatment plan for underlying cause of condition will improve Outcome: Progressing   Problem: Safety: Goal: Periods of time without injury will increase Outcome: Progressing

## 2024-09-02 NOTE — Group Note (Signed)
 Date:  09/02/2024 Time:  9:56 AM  Group Topic/Focus:  Coping With Mental Health Crisis:   The purpose of this group is to help patients identify strategies for coping with mental health crisis.  Group discusses possible causes of crisis and ways to manage them effectively.  Two motivational and supportive meditation videos were provided to promote relaxation and rejuvenation of the mind, body, and spirit. Patients were guided to reflect on their experiences from 2025 and encouraged to make intentional, healthy decisions for entering 2026 on a positive and balanced path. Time was allotted for guided breathing exercises, allowing patients to focus on deep inhalation and exhalation while engaging in personal reflection.  Participation Level:  Did Not Attend  Participation Quality:    Affect:    Cognitive:    Insight:   Engagement in Group:    Modes of Intervention:    Additional Comments:    Miguel [name] [name] 09/02/2024, 9:56 AM

## 2024-09-03 DIAGNOSIS — F03918 Unspecified dementia, unspecified severity, with other behavioral disturbance: Secondary | ICD-10-CM | POA: Diagnosis not present

## 2024-09-03 DIAGNOSIS — F200 Paranoid schizophrenia: Secondary | ICD-10-CM | POA: Diagnosis not present

## 2024-09-03 NOTE — [facility] Group Notes (Signed)
 Spirituality Group   Description: Participant directed exploration of values, beliefs and meaning   **Focus on how we generate hope and sit with uncertainty   Following a brief framework of chaplains role and ground rules of group behavior, participants are invited to share concerns or questions that engage spiritual life. Emphasis placed on common themes and shared experiences and ways to make meaning and clarify living into ones values.   Theory/Process/Goal: Utilize the theoretical framework of group therapy established by Celena Kite, Relational Cultural Theory and Rogerian approaches to facilitate relational empathy and use of the here and now to foster reflection, self-awareness, and sharing.   Observations: Mr Mccaskill was an active participant in the group discussion.  Glori Machnik L. Delores HERO.Div

## 2024-09-03 NOTE — Group Note (Signed)
 Date:  09/03/2024 Time:  8:50 PM  Group Topic/Focus:  Wrap-Up Group:   The focus of this group is to help patients review their daily goal of treatment and discuss progress on daily workbooks.    Participation Level:  Active  Participation Quality:  Appropriate  Affect:  Appropriate  Cognitive:  Appropriate  Insight: Appropriate  Engagement in Group:  Engaged  Modes of Intervention:  Discussion  Additional Comments:    [name] 09/03/2024, 8:50 PM

## 2024-09-03 NOTE — Consult Note (Signed)
 WOC Nurse wound follow up Wound type: PI Stage 3 to sacrum.  I spoke with the bed nurse. The patient start to have a green discharge from the wound.  Changed the alginate dressing to hydrofiber Ag+. Dressing procedure/placement/frequency: Cleanse the wound with saline, pat dry. Apply a single layer of Aquacel Ag # [id number] to the wound bed, top with foam dressing, change daily. *In case the fiber adheres slightly, remove it by  moistening with saline.  Recommended a further investigation due to a possible osteomyelitis.  WOC team will not plan to follow further. Please reconsult if further assistance is needed. Thank-you,  Lela Holm MSN, RN, CWCN, CNS.  (Phone [phone])

## 2024-09-03 NOTE — Group Note (Signed)
 Date:  09/03/2024 Time:  10:54 AM  Group Topic/Focus:  Rediscovering Joy:   The focus of this group is to explore various ways to relieve stress in a positive manner.    Participation Level:  Active  Participation Quality:  Appropriate  Affect:  Appropriate  Cognitive:  Appropriate  Insight: Appropriate  Engagement in Group:  Engaged  Modes of Intervention:  Activity  Additional Comments:    Camellia HERO Naydene Kamrowski 09/03/2024, 10:54 AM

## 2024-09-03 NOTE — Progress Notes (Signed)
 [facility] MD Progress Note  09/03/2024 12:26 PM Miguel [name]  MRN:  [id number]  Miguel [name] is a 74 year old male with a past psychiatric history significant for schizophrenia who presents under involuntary commitment initiated by family due to medication non-adherence, increasing paranoia, and behavioral disorganization. 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 Brother reports multiple episodes of patient whispering through the night, stating people were talking about him and planning to harm him. Patient was advised by his ACT Team to come to the ED; he refused, prompting the family to pursue IVC.Patient is admitted to [facility] unit with Q15 min safety monitoring. Multidisciplinary team approach is offered. Medication management; group/milieu therapy is offered.   Subjective:  Chart reviewed, case discussed in multidisciplinary meeting, patient seen during rounds.   Patient is noted to be walking around in the hallway.  He remains disorganized in his thought process and continues to talk about people trying to take things from him and his disagreement with what happening on the unit.  He then switches the topic to his family.  He denies SI/HI/plan.  At this time patient has a court hearing on 12th for legal guardianship. Past Psychiatric History: see h&P Family History:  Family History  Family history unknown: Yes   Social History:  Social History   Substance and Sexual Activity  Alcohol Use No     Social History   Substance and Sexual Activity  Drug Use No     Social History   Socioeconomic History   Marital status: Divorced    Spouse name: Not on file   Number of children: Not on file   Years of education: Not on file   Highest education level: Not on file  Occupational History   Not on file  Tobacco Use   Smoking status: Every Day    Current packs/day: 0.50    Average packs/day: 0.5 packs/day for 46.0 years (23.0 ttl pk-yrs)    Types: Cigarettes    Start date: 42   Smokeless tobacco: Never  Vaping Use   Vaping status: Never Used  Substance and Sexual Activity   Alcohol use: No   Drug use: No   Sexual activity: Not on file  Other Topics Concern   Not on file  Social History Narrative   Not on file   Social Drivers of Health   Tobacco Use: High Risk (07/18/2024)   Patient History    Smoking Tobacco Use: Every Day    Smokeless Tobacco Use: Never    Passive Exposure: Not on file  Financial Resource Strain: Not on file  Food Insecurity: Patient Unable To Answer (07/18/2024)   Epic    Worried About Programme Researcher, Broadcasting/film/video in the Last Year: Patient unable to answer    Ran Out of Food in the Last Year: Patient unable to answer  Transportation Needs: Unmet Transportation Needs (07/18/2024)   Epic    Lack of Transportation (Medical): Yes    Lack of Transportation (Non-Medical): Yes  Physical Activity: Not on file  Stress: Not on file  Social Connections: Patient Unable To Answer (07/18/2024)   Social Connection and Isolation Panel    Frequency of Communication with Friends and Family:  Patient unable to answer    Frequency of Social Gatherings with Friends and Family: Patient unable to answer    Attends Religious Services: Patient unable to answer    Active Member of Clubs or Organizations: Patient unable to answer    Attends Club or Organization Meetings: Patient unable to answer    Marital Status: Patient unable to answer  Depression (PHQ2-9): Not on file  Alcohol Screen: Low Risk (07/18/2024)   Alcohol Screen    Last  Alcohol Screening Score (AUDIT): 0  Housing: Unknown (07/18/2024)   Epic    Unable to Pay for Housing in the Last Year: Patient unable to answer    Number of Times Moved in the Last Year: 0    Homeless in the Last Year: Patient declined  Utilities: Patient Unable To Answer (07/18/2024)   Epic    Threatened with loss of utilities: Patient unable to answer  Health Literacy: Not on file   Past Medical History:  Past Medical History:  Diagnosis Date   Biventricular failure (HCC)    CVA (cerebral vascular accident) (HCC)    Polysubstance use disorder    Schizophrenia (HCC)     Past Surgical History:  Procedure Laterality Date   APPENDECTOMY     TRACHEOSTOMY TUBE PLACEMENT N/A 03/04/2024   Procedure: CREATION, TRACHEOSTOMY;  Surgeon: [name] Massie RAMAN, MD;  Location: [facility] ORS;  Service: ENT;  Laterality: N/A;    Current Medications: Current Facility-Administered Medications  Medication Dose Route Frequency Provider Last Rate Last Admin   acetaminophen  (TYLENOL ) tablet 650 mg  650 mg Oral Q6H PRN Hampton, Tracie B, NP   650 mg at 08/30/24 1928   alum & mag hydroxide-simeth (MAALOX/MYLANTA) 200-200-20 MG/5ML suspension 30 mL  30 mL Oral Q4H PRN Hampton, Tracie B, NP   30 mL at 08/05/24 0306   amiodarone  (PACERONE ) tablet 400 mg  400 mg Oral Daily [name], [name], MD   400 mg at 09/03/24 9167   ARIPiprazole  ER (ABILIFY  MAINTENA) injection 400 mg  400 mg Intramuscular Q28 days Jennifermarie Franzen, MD   400 mg at 08/24/24 0934   benzocaine  (ORAJEL) 10 % mucosal gel   Mouth/Throat TID PRN [name], [name], MD       divalproex  (DEPAKOTE  ER) 24 hr tablet 1,000 mg  1,000 mg Oral QHS Soma Bachand, MD   1,000 mg at 09/02/24 2051   docusate sodium  (COLACE) capsule 100 mg  100 mg Oral Daily Shakerria Parran, MD   100 mg at 09/03/24 0831   feeding supplement (ENSURE PLUS HIGH PROTEIN) liquid 237 mL  237 mL Oral TID BM [name], [name], MD   237 mL at 09/03/24 0838   hydrOXYzine  (ATARAX ) tablet 25 mg  25  mg Oral TID PRN Hampton, Tracie B, NP   25 mg at 08/30/24 2132   magnesium  hydroxide (MILK OF MAGNESIA) suspension 30 mL  30 mL Oral Daily PRN Hampton, Tracie B, NP   30 mL at 08/08/24 2233   memantine  (NAMENDA ) tablet 10 mg  10 mg Oral Daily Breklyn Fabrizio, MD   10 mg at 09/03/24 9166   metoprolol  tartrate (LOPRESSOR ) tablet 12.5 mg  12.5 mg Oral BID [name], [name], MD   12.5 mg at 09/03/24 9167   naproxen  (NAPROSYN ) tablet 250 mg  250 mg Oral BID WC Torrion Witter, MD   250 mg at 09/03/24 9167   OLANZapine  (ZYPREXA ) injection 5 mg  5 mg Intramuscular TID PRN Hampton, Tracie B, NP  OLANZapine  zydis (ZYPREXA ) disintegrating tablet 5 mg  5 mg Oral TID PRN Hampton, Tracie B, NP   5 mg at 08/04/24 1129   oxyCODONE  (Oxy IR/ROXICODONE ) immediate release tablet 5 mg  5 mg Oral Q4H PRN Dorinda Drue DASEN, MD   5 mg at 09/01/24 2102   pantoprazole  (PROTONIX ) EC tablet 40 mg  40 mg Oral Daily [name], [name], MD   40 mg at 09/03/24 9167   sodium chloride  (OCEAN) 0.65 % nasal spray 1 spray  1 spray Each Nare PRN Donnelly Mellow, MD   1 spray at 08/18/24 1459   traZODone  (DESYREL ) tablet 50 mg  50 mg Oral QHS PRN Hampton, Tracie B, NP   50 mg at 09/02/24 2051    Lab Results:  Results for orders placed or performed during the hospital encounter of 07/18/24 (from the past 48 hours)  Valproic acid  level     Status: None   Collection Time: 09/02/24  6:10 AM  Result Value Ref Range   Valproic Acid  Lvl 57 50 - 100 ug/mL    Comment: Performed at [facility], [address] [place], [place] [address]      Blood Alcohol level:  Lab Results  Component Value Date   [facility] <15 07/17/2024   ETH <15 02/20/2024    Metabolic Disorder Labs: Lab Results  Component Value Date   HGBA1C 5.4 08/12/2024   MPG 108.28 08/12/2024   MPG 119.76 07/14/2018   No results found for: PROLACTIN Lab Results  Component Value Date   CHOL 139 08/12/2024   TRIG 73 08/12/2024   HDL 52 08/12/2024    CHOLHDL 2.7 08/12/2024   VLDL 15 08/12/2024   LDLCALC 72 08/12/2024   LDLCALC 119 (H) 07/13/2018    Physical Findings: AIMS:  , ,  ,  ,    CIWA:    COWS:      Psychiatric Specialty Exam:  Presentation  General Appearance: Appropriate for Environment  Eye Contact:Fleeting  Speech:Normal Rate  Speech Volume:Decreased    Mood and Affect  Mood:Anxious  Affect:Flat   Thought Process  Thought Processes:circumstantial Orientation:Partial  Thought Content:discharge focused  Hallucinations:denies  Ideas of Reference:Delusions At baseline Suicidal Thoughts:denies  Homicidal Thoughts:denies   Sensorium  Memory:Immediate Poor; Recent Poor; Remote Poor  Judgment:Impaired  Insight:None   Executive Functions  Concentration:Poor  Attention Span:Poor  Recall:Poor  Fund of Knowledge:Poor  Language:Fair   Psychomotor Activity  Psychomotor Activity:No data recorded  Musculoskeletal: Strength & Muscle Tone: within normal limits Gait & Station: normal Assets  Assets:Communication Skills; Desire for Improvement; Social Support    Physical Exam: Physical Exam Vitals and nursing note reviewed.    ROS Blood pressure 117/76, pulse 66, temperature 98.3 F (36.8 C), resp. rate 14, height 6' 1 (1.854 m), weight 81.2 kg, SpO2 98%. Body mass index is 23.62 kg/m.  Diagnosis: Active Problems:   Schizophrenia, paranoid (HCC)   Dementia with behavioral disturbance (HCC)   PLAN: Safety and Monitoring:  -- Involuntary admission to inpatient psychiatric unit for safety, stabilization and treatment  -- Daily contact with patient to assess and evaluate symptoms and progress in treatment  -- Patient's case to be discussed in multi-disciplinary team meeting  -- Observation Level : q15 minute checks  -- Vital signs:  q12 hours  -- Precautions: suicide, elopement, and assault -- Encouraged patient to participate in unit milieu and in scheduled group therapies   2. Psychiatric Treatment:  Scheduled Medications:  Discontinued oral Abilify  LAY Abilify  Maintena 400  mg q4 weeks given on 07/27/24, 08/24/2024  Cogentin  1mg  one dose given for EPS Depakote  ER increased to 1000 mg to help with mood stabilization  Depakote  levels 09/02/24: 57  -- The risks/benefits/side-effects/alternatives to this medication were discussed in detail with the patient and time was given for questions. The patient consents to medication trial.  3. Medical Issues Being Addressed:  [facility] consulted for wound care management   Assessment and Plan:   Sacral Decubitus Ulcer Full thickness decubitus ulcer in setting of recent extended hospitalization, discharged from [facility] Wound looks pretty good overall WOC on board we appreciate input   Cognitive Deficit Concern for anoxic brain injury at discharge 02/2024 Delirium precautions   Peripheral Vascular Disease Legs with features c/w peripheral vascular disease, diminished DP pulses Would benefit from outpatient ABI's   HFrEF Euvolemic at this time Continue home medications   Hx Atrial Flutter Not on any anticoagulation at this time EKG with NSR Continue amiodarone   Hypertension Noted, adjust meds gradually after med rec complete   Tooth pain/Infection - Added: Augmentin  along with Orajel BP -better today no longer hypotensive-Will continue reduced dose of metoprolol  12.5 bid.   4. Discharge Planning:   -- Social work and case management to assist with discharge planning and identification of hospital follow-up needs prior to discharge  -- Estimated LOS: 3-4 days  Sultan Pargas, MD 09/03/2024, 12:26 PM

## 2024-09-03 NOTE — Group Note (Signed)
 Recreation Therapy Group Note   Group Topic:Leisure Education  Group Date: 09/03/2024 Start Time: 1415 End Time: 1500 Facilitators: Celestia Jeoffrey BRAVO, LRT, CTRS Location: Courtyard  Group Description: Outdoor Recreation. Patients had the option to play corn hole, ring toss, UNO, or listening to music while outside in the courtyard getting fresh air and sunlight.  LRT and patients discussed things that they enjoy doing in their free time outside of the hospital. LRT encouraged patients to drink water  after being active and getting their heart rate up.   Goal Area(s) Addressed: Patient will identify leisure interests.  Patient will practice healthy decision making. Patient will engage in recreation activity.   Affect/Mood: Appropriate   Participation Level: Active and Engaged   Participation Quality: Independent   Behavior: Calm and Cooperative   Speech/Thought Process: Loose association   Insight: Fair   Judgement: Fair    Modes of Intervention: Education, Exploration, Guided Discussion, and Music   Patient Response to Interventions:  Receptive   Education Outcome:  In group clarification offered    Clinical Observations/Individualized Feedback: Catcher was active in their participation of session activities and group discussion. Pt chose to play cornhole with LRT and peers while outside.    Plan: Continue to engage patient in RT group sessions 2-3x/week.   Jeoffrey BRAVO Celestia, LRT, CTRS 09/03/2024 4:33 PM

## 2024-09-03 NOTE — Progress Notes (Signed)
 Behavior:  Confused. Pleasant and cooperative.    Psych assessment:  Denies SI/HI and AVH.    Interaction / Group attendance:  Present in the milieu.  Appropriate interaction with peers and staff. Attended 3/4 groups.  Medication/ PRNs: Compliant.  Pain: Denies.  15 min checks in place for safety.    Wound cleaned and new foam dressing applied.

## 2024-09-03 NOTE — Group Note (Signed)
 Date:  09/03/2024 Time:  3:47 PM  Group Topic/Focus:  Movement Therapy, Getting fit with Raine Blodgett.    Participation Level:  Did Not Attend    [name] 09/03/2024, 3:47 PM

## 2024-09-03 NOTE — Plan of Care (Signed)
°  Problem: Activity: Goal: Interest or engagement in activities will improve Outcome: Progressing   Problem: Education: Goal: Knowledge of  General Education information/materials will improve Outcome: Not Progressing Goal: Mental status will improve Outcome: Not Progressing

## 2024-09-03 NOTE — Group Note (Signed)
 LCSW Group Therapy Note  Group Date: 09/03/2024 Start Time: 1325 End Time: 1340   Type of Therapy and Topic:  Group Therapy - How To Cope with Nervousness about Discharge   Participation Level:  Minimal   Description of Group This process group involved identification of patients' feelings about discharge. Some of them are scheduled to be discharged soon, while others are new admissions, but each of them was asked to share thoughts and feelings surrounding discharge from the hospital. One common theme was that they are excited at the prospect of going home, while another was that many of them are apprehensive about sharing why they were hospitalized. Patients were given the opportunity to discuss these feelings with their peers in preparation for discharge.  Therapeutic Goals  Patient will identify their overall feelings about pending discharge. Patient will think about how they might proactively address issues that they believe will once again arise once they get home (i.e. with parents). Patients will participate in discussion about having hope for change.   Summary of Patient Progress:  Miguel [name] was very active throughout the session. Miguel [name] demonstrated minimal insight into the subject matter, and proved open to input from peers and feedback from CSW. Miguel [name] was respectful of peers and participated throughout the entire session.   Therapeutic Modalities Cognitive Behavioral Therapy   Miguel [name], LCSWA 09/03/2024  2:20 PM

## 2024-09-03 NOTE — Progress Notes (Signed)
" °   09/02/24 1515  Spiritual Encounters  Type of Visit Follow up  Care provided to: Patient  Referral source Patient request  Reason for visit Routine spiritual support  OnCall Visit No   I met briefly with Miguel [name] following spirituality group at his request.  Miguel [name] continued to engage in meaning making and reflection he thought of during group. 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 He expressed a feeling of peace and contentment from this.  I provided compassionate presence and relational support. Will continue to follow and support as appropriate.  Stefannie Defeo L. Delores HERO.Div "

## 2024-09-03 NOTE — Plan of Care (Signed)
  Problem: Activity: Goal: Interest or engagement in activities will improve Outcome: Progressing   Problem: Education: Goal: Emotional status will improve Outcome: Progressing Goal: Mental status will improve Outcome: Progressing

## 2024-09-04 DIAGNOSIS — F200 Paranoid schizophrenia: Secondary | ICD-10-CM | POA: Diagnosis not present

## 2024-09-04 DIAGNOSIS — F03918 Unspecified dementia, unspecified severity, with other behavioral disturbance: Secondary | ICD-10-CM | POA: Diagnosis not present

## 2024-09-04 NOTE — Group Note (Signed)
 Date:  09/04/2024 Time:  10:39 AM  Group Topic/Focus:  Coping With Mental Health Crisis:   The purpose of this group is to help patients identify strategies for coping with mental health crisis.  Group discusses possible causes of crisis and ways to manage them effectively.    Participation Level:  Did Not Attend    [name] 09/04/2024, 10:39 AM

## 2024-09-04 NOTE — Group Note (Signed)
 Date:  09/04/2024 Time:  8:40 PM  Group Topic/Focus:  Identifying Needs:   The focus of this group is to help patients identify their personal needs that have been historically problematic and identify healthy behaviors to address their needs.    Participation Level:  Active  Participation Quality:  Appropriate  Affect:  Appropriate  Cognitive:  Appropriate Insight: Good and None  Engagement in Group:    Modes of Intervention:  Discussion  Additional Comments:    Miguel [name] 09/04/2024, 8:40 PM

## 2024-09-04 NOTE — Progress Notes (Signed)
" °   09/03/24 2000  Psych Admission Type (Psych Patients Only)  Admission Status Involuntary  Psychosocial Assessment  Patient Complaints None  Eye Contact Fair  Facial Expression Animated  Affect Appropriate to circumstance  Speech Soft;Slurred  Interaction Assertive  Motor Activity Slow  Appearance/Hygiene Unremarkable  Behavior Characteristics Cooperative;Calm  Mood Pleasant  Thought Process  Coherency Tangential  Content Preoccupation  Delusions None reported or observed  Perception WDL  Hallucination None reported or observed  Judgment Poor  Confusion Mild  Danger to Self  Current suicidal ideation? Denies  Agreement Not to Harm Self Yes  Description of Agreement Verbal  Danger to Others  Danger to Others None reported or observed    "

## 2024-09-04 NOTE — Group Note (Signed)
 Physical/Occupational Therapy Group Note  Group Topic: UE Therex   Group Date: 09/04/2024 Start Time: 1305 End Time: 1335 Facilitators: Clive Warren CROME, OT   Group Description: Group instructed in series of upper extremities exercises, aimed to promote strength, flexibility, range of motion and functional endurance.  Patients provided cuing for proper mechanics and proper pace of exercise; exercises adjusted as necessary for individualized patient needs.  Patient also engaged in cognitive components throughout session, working to integrate attention to task, command following, turn-taking and appropriate social interaction throughout session.  Allowed to ask questions as appropriate, and encouraged to identify specific exercises that they could complete independently outside of group sessions.  Therapeutic Goal(s):  Demonstrate appropriate performance of upper extremity exercises to promote strength, flexibility, range of motion and functional endurance Identify 2-3 specific upper extremity exercises to complete as home exercise program outside of group session  Individual Participation:  Pt quiet, appears to demo decreased alertness at times but still completing exercises when asked. Some limited participation in group discussion with prompting.   Participation Level: Moderate   Participation Quality: Moderate Cues   Behavior: Alert, Disinterested, and Lethargic   Speech/Thought Process: Barely audible   Affect/Mood: Constricted, Depressed, and Stable    Insight: Poor   Judgement: Poor   Modes of Intervention: Activity, Clarification, Discussion, Education, Exploration, Problem-solving, Rapport Building, Socialization, and Support  Patient Response to Interventions:  Engaged and Receptive   Plan: Continue to engage patient in PT/OT groups 1 - 2x/week.   Lindyn Vossler R., MPH, MS, OTR/L ascom [phone] 09/04/2024, 3:07 PM

## 2024-09-04 NOTE — Plan of Care (Signed)
" °  Problem: Activity: Goal: Interest or engagement in activities will improve Outcome: Progressing   Problem: Coping: Goal: Ability to verbalize frustrations and anger appropriately will improve Outcome: Progressing   Problem: Education: Goal: Emotional status will improve Outcome: Progressing Goal: Mental status will improve Outcome: Progressing   Problem: Health Behavior/Discharge Planning: Goal: Compliance with treatment plan for underlying cause of condition will improve Outcome: Progressing   Problem: Physical Regulation: Goal: Ability to maintain clinical measurements within normal limits will improve Outcome: Progressing   Problem: Safety: Goal: Periods of time without injury will increase Outcome: Progressing   "

## 2024-09-04 NOTE — Plan of Care (Signed)
  Problem: Activity: Goal: Interest or engagement in activities will improve Outcome: Progressing   Problem: Physical Regulation: Goal: Ability to maintain clinical measurements within normal limits will improve Outcome: Progressing   Problem: Safety: Goal: Periods of time without injury will increase Outcome: Progressing

## 2024-09-04 NOTE — Plan of Care (Signed)
" °  Problem: Activity: Goal: Interest or engagement in activities will improve Outcome: Progressing   Problem: Activity: Goal: Interest or engagement in activities will improve Outcome: Progressing   Problem: Education: Goal: Knowledge of [facility] Education information/materials will improve Outcome: Not Progressing   "

## 2024-09-04 NOTE — Progress Notes (Signed)
 [facility] MD Progress Note  09/04/2024 12:06 PM Miguel [name]  MRN:  [id number]  Miguel [name] is a 74 year old male with a past psychiatric history significant for schizophrenia who presents under involuntary commitment initiated by family due to medication non-adherence, increasing paranoia, and behavioral disorganization. 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 Brother reports multiple episodes of patient whispering through the night, stating people were talking about him and planning to harm him. Patient was advised by his ACT Team to come to the ED; he refused, prompting the family to pursue IVC.Patient is admitted to [facility] unit with Q15 min safety monitoring. Multidisciplinary team approach is offered. Medication management; group/milieu therapy is offered.   Subjective:  Chart reviewed, case discussed in multidisciplinary meeting, patient seen during rounds.   Patient is noted to be walking in the hallway.  He reports feeling drowsy but denies any complaints.  He remains confused and is unable to answer the questions linearly.  Patient is not endorsing SI/HI/plan and not responding to internal stimuli.  He has ongoing paranoid delusions about constantly people stealing things from him or texting his patient is.  Per nursing patient is taking medications with no reported side effects Past Psychiatric History: see h&P Family History:  Family History  Family history unknown: Yes   Social History:  Social History   Substance and Sexual Activity  Alcohol Use No      Social History   Substance and Sexual Activity  Drug Use No    Social History   Socioeconomic History   Marital status: Divorced    Spouse name: Not on file   Number of children: Not on file   Years of education: Not on file   Highest education level: Not on file  Occupational History   Not on file  Tobacco Use   Smoking status: Every Day    Current packs/day: 0.50    Average packs/day: 0.5 packs/day for 46.0 years (23.0 ttl pk-yrs)    Types: Cigarettes    Start date: 54   Smokeless tobacco: Never  Vaping Use   Vaping status: Never Used  Substance and Sexual Activity   Alcohol use: No   Drug use: No   Sexual activity: Not on file  Other Topics Concern   Not on file  Social History Narrative   Not on file   Social Drivers of Health   Tobacco Use: High Risk (07/18/2024)   Patient History    Smoking Tobacco Use: Every Day    Smokeless Tobacco Use: Never    Passive Exposure: Not on file  Financial Resource Strain: Not on file  Food Insecurity: Patient Unable To Answer (07/18/2024)   Epic    Worried About Programme Researcher, Broadcasting/film/video in the Last Year: Patient unable to answer    Ran Out of Food in the Last Year: Patient unable to answer  Transportation Needs: Unmet Transportation Needs (07/18/2024)   Epic    Lack of Transportation (Medical): Yes    Lack of Transportation (Non-Medical): Yes  Physical Activity: Not on file  Stress: Not on file  Social Connections: Patient Unable To Answer (07/18/2024)   Social Connection and Isolation Panel    Frequency  of Communication with Friends and Family: Patient unable to answer    Frequency of Social Gatherings with Friends and Family: Patient unable to answer    Attends Religious Services: Patient unable to answer    Active Member of Clubs or Organizations: Patient unable to answer    Attends Banker Meetings: Patient unable to answer    Marital Status: Patient unable to answer  Depression (PHQ2-9): Not on file   Alcohol Screen: Low Risk (07/18/2024)   Alcohol Screen    Last Alcohol Screening Score (AUDIT): 0  Housing: Unknown (07/18/2024)   Epic    Unable to Pay for Housing in the Last Year: Patient unable to answer    Number of Times Moved in the Last Year: 0    Homeless in the Last Year: Patient declined  Utilities: Patient Unable To Answer (07/18/2024)   Epic    Threatened with loss of utilities: Patient unable to answer  Health Literacy: Not on file   Past Medical History:  Past Medical History:  Diagnosis Date   Biventricular failure (HCC)    CVA (cerebral vascular accident) (HCC)    Polysubstance use disorder    Schizophrenia (HCC)     Past Surgical History:  Procedure Laterality Date   APPENDECTOMY     TRACHEOSTOMY TUBE PLACEMENT N/A 03/04/2024   Procedure: CREATION, TRACHEOSTOMY;  Surgeon: [name] Massie RAMAN, MD;  Location: [facility] ORS;  Service: ENT;  Laterality: N/A;    Current Medications: Current Facility-Administered Medications  Medication Dose Route Frequency Provider Last Rate Last Admin   acetaminophen  (TYLENOL ) tablet 650 mg  650 mg Oral Q6H PRN Hampton, Tracie B, NP   650 mg at 08/30/24 1928   alum & mag hydroxide-simeth (MAALOX/MYLANTA) 200-200-20 MG/5ML suspension 30 mL  30 mL Oral Q4H PRN Hampton, Tracie B, NP   30 mL at 08/05/24 0306   amiodarone  (PACERONE ) tablet 400 mg  400 mg Oral Daily [name], [name], MD   400 mg at 09/03/24 9167   ARIPiprazole  ER (ABILIFY  MAINTENA) injection 400 mg  400 mg Intramuscular Q28 days Annelyse Rey, MD   400 mg at 08/24/24 9065   benzocaine  (ORAJEL) 10 % mucosal gel   Mouth/Throat TID PRN [name], [name], MD       divalproex  (DEPAKOTE  ER) 24 hr tablet 1,000 mg  1,000 mg Oral QHS Rakel Junio, MD   1,000 mg at 09/03/24 2057   docusate sodium  (COLACE) capsule 100 mg  100 mg Oral Daily Khiry Pasquariello, MD   100 mg at 09/04/24 0851   feeding supplement (ENSURE PLUS HIGH PROTEIN) liquid 237 mL  237 mL Oral TID BM [name], [name], MD    237 mL at 09/04/24 0855   hydrOXYzine  (ATARAX ) tablet 25 mg  25 mg Oral TID PRN Hampton, Tracie B, NP   25 mg at 08/30/24 2132   magnesium  hydroxide (MILK OF MAGNESIA) suspension 30 mL  30 mL Oral Daily PRN Hampton, Tracie B, NP   30 mL at 08/08/24 2233   memantine  (NAMENDA ) tablet 10 mg  10 mg Oral Daily Kaelee Pfeffer, MD   10 mg at 09/04/24 0851   metoprolol  tartrate (LOPRESSOR ) tablet 12.5 mg  12.5 mg Oral BID [name], [name], MD   12.5 mg at 09/03/24 2058   naproxen  (NAPROSYN ) tablet 250 mg  250 mg Oral BID WC Sharonica Kraszewski, MD   250 mg at 09/04/24 0851   OLANZapine  (ZYPREXA ) injection 5 mg  5 mg Intramuscular TID PRN Hampton, Tracie B,  NP       OLANZapine  zydis (ZYPREXA ) disintegrating tablet 5 mg  5 mg Oral TID PRN Hampton, Tracie B, NP   5 mg at 08/04/24 1129   oxyCODONE  (Oxy IR/ROXICODONE ) immediate release tablet 5 mg  5 mg Oral Q4H PRN Dorinda Homans T, MD   5 mg at 09/01/24 2102   pantoprazole  (PROTONIX ) EC tablet 40 mg  40 mg Oral Daily [name], [name], MD   40 mg at 09/04/24 0851   sodium chloride  (OCEAN) 0.65 % nasal spray 1 spray  1 spray Each Nare PRN Donnelly Mellow, MD   1 spray at 08/18/24 1459   traZODone  (DESYREL ) tablet 50 mg  50 mg Oral QHS PRN Hampton, Tracie B, NP   50 mg at 09/03/24 2057    Lab Results:  No results found for this or any previous visit (from the past 48 hours).     Blood Alcohol level:  Lab Results  Component Value Date   [facility] <15 07/17/2024   ETH <15 02/20/2024    Metabolic Disorder Labs: Lab Results  Component Value Date   HGBA1C 5.4 08/12/2024   MPG 108.28 08/12/2024   MPG 119.76 07/14/2018   No results found for: PROLACTIN Lab Results  Component Value Date   CHOL 139 08/12/2024   TRIG 73 08/12/2024   HDL 52 08/12/2024   CHOLHDL 2.7 08/12/2024   VLDL 15 08/12/2024   LDLCALC 72 08/12/2024   LDLCALC 119 (H) 07/13/2018    Physical Findings: AIMS:  , ,  ,  ,    CIWA:    COWS:      Psychiatric Specialty  Exam:  Presentation  General Appearance: Appropriate for Environment  Eye Contact:Fleeting  Speech:Normal Rate  Speech Volume:Decreased    Mood and Affect  Mood:Anxious  Affect:Flat   Thought Process  Thought Processes:circumstantial Orientation:Partial  Thought Content:discharge focused  Hallucinations:denies  Ideas of Reference:Delusions At baseline Suicidal Thoughts:denies  Homicidal Thoughts:denies   Sensorium  Memory:Immediate Poor; Recent Poor; Remote Poor  Judgment:Impaired  Insight:None   Executive Functions  Concentration:Poor  Attention Span:Poor  Recall:Poor  Fund of Knowledge:Poor  Language:Fair   Psychomotor Activity  Psychomotor Activity:No data recorded  Musculoskeletal: Strength & Muscle Tone: within normal limits Gait & Station: normal Assets  Assets:Communication Skills; Desire for Improvement; Social Support    Physical Exam: Physical Exam Vitals and nursing note reviewed.    ROS Blood pressure (!) 101/51, pulse 62, temperature 97.8 F (36.6 C), resp. rate 16, height 6' 1 (1.854 m), weight 81.2 kg, SpO2 99%. Body mass index is 23.62 kg/m.  Diagnosis: Active Problems:   Schizophrenia, paranoid (HCC)   Dementia with behavioral disturbance (HCC)   PLAN: Safety and Monitoring:  -- Involuntary admission to inpatient psychiatric unit for safety, stabilization and treatment  -- Daily contact with patient to assess and evaluate symptoms and progress in treatment  -- Patient's case to be discussed in multi-disciplinary team meeting  -- Observation Level : q15 minute checks  -- Vital signs:  q12 hours  -- Precautions: suicide, elopement, and assault -- Encouraged patient to participate in unit milieu and in scheduled group therapies  2. Psychiatric Treatment:  Scheduled Medications:  Discontinued oral Abilify  LAY Abilify  Maintena 400 mg q4 weeks given on 07/27/24, 08/24/2024  Cogentin  1mg  one dose given for  EPS Depakote  ER increased to 1000 mg to help with mood stabilization  Depakote  levels 09/02/24: 57  -- The risks/benefits/side-effects/alternatives to this medication were discussed in detail with the patient  and time was given for questions. The patient consents to medication trial.  3. Medical Issues Being Addressed:  [facility] consulted for wound care management   Assessment and Plan:   Sacral Decubitus Ulcer Full thickness decubitus ulcer in setting of recent extended hospitalization, discharged from [facility] Nov 14th Wound looks pretty good overall WOC on board we appreciate input   Cognitive Deficit Concern for anoxic brain injury at discharge 02/2024 Delirium precautions   Peripheral Vascular Disease Legs with features c/w peripheral vascular disease, diminished DP pulses Would benefit from outpatient ABI's   HFrEF Euvolemic at this time Continue home medications   Hx Atrial Flutter Not on any anticoagulation at this time EKG with NSR Continue amiodarone   Hypertension Noted, adjust meds gradually after med rec complete   Tooth pain/Infection - Added: Augmentin  along with Orajel BP -better today no longer hypotensive-Will continue reduced dose of metoprolol  12.5 bid.   4. Discharge Planning:   -- Social work and case management to assist with discharge planning and identification of hospital follow-up needs prior to discharge  -- Estimated LOS: 3-4 days  Carrieann Spielberg, MD 09/04/2024, 12:06 PM

## 2024-09-04 NOTE — Progress Notes (Signed)
 Behavior:  Confused.  Pleasant and cooperative.    Psych assessment: Denies SI/HI and AVH.    Group attendance:  1/ 2.  Medication/ PRNs: Compliant.  Cardiac meds held due to low BP.  Dr. JINNY aware.   Pain: Denies.  15 min checks in place for safety.    Wound cleaned.  Dressing changed.

## 2024-09-05 DIAGNOSIS — F200 Paranoid schizophrenia: Secondary | ICD-10-CM | POA: Diagnosis not present

## 2024-09-05 DIAGNOSIS — F03918 Unspecified dementia, unspecified severity, with other behavioral disturbance: Secondary | ICD-10-CM | POA: Diagnosis not present

## 2024-09-05 NOTE — Group Note (Signed)
 Date:  09/05/2024 Time:  10:10 PM  Group Topic/Focus:  Healthy Communication:   The focus of this group is to discuss communication, barriers to communication, as well as healthy ways to communicate with others.    Participation Level:  Active  Participation Quality:  Appropriate  Affect:  Appropriate  Cognitive:  Appropriate  Insight: Good  Engagement in Group:  Engaged  Modes of Intervention:  Discussion  Additional Comments:    Romero Earnie Hope 09/05/2024, 10:10 PM

## 2024-09-05 NOTE — BH IP Treatment Plan (Signed)
 Interdisciplinary Treatment and Diagnostic Plan Update  09/05/2024 Time of Session: 120pm Miguel [name] MRN: [id number]  Principal Diagnosis: <principal problem not specified>  Secondary Diagnoses: Active Problems:   Schizophrenia, paranoid (HCC)   Dementia with behavioral disturbance (HCC)   Current Medications:  Current Facility-Administered Medications  Medication Dose Route Frequency Provider Last Rate Last Admin   acetaminophen  (TYLENOL ) tablet 650 mg  650 mg Oral Q6H PRN Hampton, Tracie B, NP   650 mg at 08/30/24 1928   alum & mag hydroxide-simeth (MAALOX/MYLANTA) 200-200-20 MG/5ML suspension 30 mL  30 mL Oral Q4H PRN Hampton, Tracie B, NP   30 mL at 08/05/24 0306   amiodarone  (PACERONE ) tablet 400 mg  400 mg Oral Daily [name], [name], MD   400 mg at 09/05/24 0829   ARIPiprazole  ER (ABILIFY  MAINTENA) injection 400 mg  400 mg Intramuscular Q28 days [name], [name], MD   400 mg at 08/24/24 9065   benzocaine  (ORAJEL) 10 % mucosal gel   Mouth/Throat TID PRN [name], [name], MD       divalproex  (DEPAKOTE  ER) 24 hr tablet 1,000 mg  1,000 mg Oral QHS [name], [name], MD   1,000 mg at 09/04/24 2033   docusate sodium  (COLACE) capsule 100 mg  100 mg Oral Daily [name], [name], MD   100 mg at 09/05/24 9170   feeding supplement (ENSURE PLUS HIGH PROTEIN) liquid 237 mL  237 mL Oral TID BM [name], [name], MD   237 mL at 09/05/24 0830   hydrOXYzine  (ATARAX ) tablet 25 mg  25 mg Oral TID PRN Hampton, Tracie B, NP   25 mg at 08/30/24 2132   magnesium  hydroxide (MILK OF MAGNESIA) suspension 30 mL  30 mL Oral Daily PRN Hampton, Tracie B, NP   30 mL at 08/08/24 2233   memantine  (NAMENDA ) tablet 10 mg  10 mg Oral Daily [name], [name], MD   10 mg at 09/05/24 9170   metoprolol  tartrate (LOPRESSOR ) tablet 12.5 mg  12.5 mg Oral BID [name], [name], MD   12.5 mg at 09/05/24 9170   naproxen  (NAPROSYN ) tablet 250 mg  250 mg Oral BID WC [name], [name], MD   250 mg at 09/05/24 0827   OLANZapine   (ZYPREXA ) injection 5 mg  5 mg Intramuscular TID PRN Hampton, Tracie B, NP       OLANZapine  zydis (ZYPREXA ) disintegrating tablet 5 mg  5 mg Oral TID PRN Hampton, Tracie B, NP   5 mg at 08/04/24 1129   oxyCODONE  (Oxy IR/ROXICODONE ) immediate release tablet 5 mg  5 mg Oral Q4H PRN [name], [name], MD   5 mg at 09/01/24 2102   pantoprazole  (PROTONIX ) EC tablet 40 mg  40 mg Oral Daily [name], [name], MD   40 mg at 09/05/24 9170   sodium chloride  (OCEAN) 0.65 % nasal spray 1 spray  1 spray Each Nare PRN [name], [name], MD   1 spray at 08/18/24 1459   traZODone  (DESYREL ) tablet 50 mg  50 mg Oral QHS PRN Hampton, Tracie B, NP   50 mg at 09/04/24 2121   PTA Medications: Medications Prior to Admission  Medication Sig Dispense Refill Last Dose/Taking   amiodarone  (PACERONE ) 400 MG tablet Place 1 tablet (400 mg total) into feeding tube daily.      ARIPiprazole  (ABILIFY ) 30 MG tablet Place 1 tablet (30 mg total) into feeding tube at bedtime.      bisacodyl  (DULCOLAX) 10 MG suppository Place 1 suppository (10 mg total) rectally daily as needed for  moderate constipation or severe constipation.      busPIRone  (BUSPAR ) 5 MG tablet Take 5 mg by mouth 3 (three) times daily.      Chlorhexidine  Gluconate Cloth 2 % PADS Apply 6 each topically daily.      digoxin  (LANOXIN ) 0.125 MG tablet 1 tablet (0.125 mg total) by Per NG tube route daily. (Patient taking differently: Take 0.125 mg by mouth every other day.)      docusate (COLACE) 50 MG/5ML liquid Place 10 mLs (100 mg total) into feeding tube 2 (two) times daily.      metoprolol  tartrate (LOPRESSOR ) 25 MG tablet Take 25 mg by mouth 2 (two) times daily.      pantoprazole  (PROTONIX ) 40 MG tablet Take 40 mg by mouth daily.      traMADol  (ULTRAM ) 50 MG tablet Take 50 mg by mouth 2 (two) times daily.       Patient Stressors: Educational concerns   Health problems   Medication change or noncompliance   Other: Stated transitional housing concerns    Patient  Strengths: Manufacturing systems engineer  Religious Affiliation   Treatment Modalities: Medication Management, Group therapy, Case management,  1 to 1 session with clinician, Psychoeducation, Recreational therapy.   Physician Treatment Plan for Primary Diagnosis: <principal problem not specified> Long Term Goal(s):     Short Term Goals:    Medication Management: Evaluate patient's response, side effects, and tolerance of medication regimen.  Therapeutic Interventions: 1 to 1 sessions, Unit Group sessions and Medication administration.  Evaluation of Outcomes: Not Progressing  Physician Treatment Plan for Secondary Diagnosis: Active Problems:   Schizophrenia, paranoid (HCC)   Dementia with behavioral disturbance (HCC)  Long Term Goal(s):     Short Term Goals:       Medication Management: Evaluate patient's response, side effects, and tolerance of medication regimen.  Therapeutic Interventions: 1 to 1 sessions, Unit Group sessions and Medication administration.  Evaluation of Outcomes: Not Progressing   RN Treatment Plan for Primary Diagnosis: <principal problem not specified> Long Term Goal(s): Knowledge of disease and therapeutic regimen to maintain health will improve  Short Term Goals: Ability to remain free from injury will improve, Ability to verbalize frustration and anger appropriately will improve, Ability to demonstrate self-control, Ability to participate in decision making will improve, Ability to verbalize feelings will improve, Ability to disclose and discuss suicidal ideas, Ability to identify and develop effective coping behaviors will improve, and Compliance with prescribed medications will improve  Medication Management: RN will administer medications as ordered by provider, will assess and evaluate patient's response and provide education to patient for prescribed medication. RN will report any adverse and/or side effects to prescribing provider.  Therapeutic  Interventions: 1 on 1 counseling sessions, Psychoeducation, Medication administration, Evaluate responses to treatment, Monitor vital signs and CBGs as ordered, Perform/monitor CIWA, COWS, AIMS and Fall Risk screenings as ordered, Perform wound care treatments as ordered.  Evaluation of Outcomes: Not Progressing   LCSW Treatment Plan for Primary Diagnosis: <principal problem not specified> Long Term Goal(s): Safe transition to appropriate next level of care at discharge, Engage patient in therapeutic group addressing interpersonal concerns.  Short Term Goals: Engage patient in aftercare planning with referrals and resources, Increase social support, Increase ability to appropriately verbalize feelings, Increase emotional regulation, Facilitate acceptance of mental health diagnosis and concerns, Facilitate patient progression through stages of change regarding substance use diagnoses and concerns, Identify triggers associated with mental health/substance abuse issues, and Increase skills for wellness and recovery  Therapeutic Interventions:  Assess for all discharge needs, 1 to 1 time with Social worker, Explore available resources and support systems, Assess for adequacy in community support network, Educate family and significant other(s) on suicide prevention, Complete Psychosocial Assessment, Interpersonal group therapy.  Evaluation of Outcomes: Not Progressing   Progress in Treatment: Attending groups: Yes.and no Participating in groups: Yes.and no Taking medication as prescribed: Yes. Toleration medication: Yes. Family/Significant other contact made: Yes, individual(s) contacted:  SPE completed with the patient's family.  Patient understands diagnosis: No. Discussing patient identified problems/goals with staff: Yes. Medical problems stabilized or resolved: Yes. Denies suicidal/homicidal ideation: Yes. Issues/concerns per patient self-inventory: No. Other: none   New problem(s)  identified: No, Describe:  None Update 07/26/24: No changes at this time Update 08/01/24: No changes at this time  Update 08/06/24: No changes at this time 08/11/24 Update: No changes at this time.  Update 08/16/24: No changes at this time. Update 12/262025:  No changes at this time. Update 08/26/2024:  No changes at this time. Update 08/31/2024:  No changes at this time. Update: 09/05/2024  New Short Term/Long Term Goal(s):detox, elimination of symptoms of psychosis, medication management for mood stabilization; elimination of SI thoughts; development of comprehensive mental wellness/sobriety plan.  Update 07/26/24: No changes at this time Update 08/01/24: No changes at this time Update 08/06/24: No changes at this time  10/13/23 Update: No changes at this time. Update 08/16/24: No changes at this time.  Update 12/262025:  No changes at this time. Update 08/26/2024:  No changes at this time. Update 08/31/2024:  No changes at this time. Update:09/05/2024 no changes  Patient Goals:  detox, elimination of symptoms of psychosis, medication management for mood stabilization; elimination of SI thoughts; development of comprehensive mental wellness/sobriety plan.  Update 07/26/24: No changes at this time Update 08/01/24: No changes at this time Update 08/06/24: No changes at this time  10/13/23 Update: No changes at this time. Update 08/16/24: No changes at this time.  Update 12/262025:  No changes at this time. Update 08/26/2024:  No changes at this time. Update 08/31/2024:  No changes at this time. 09/05/2024: no changes  Discharge Plan or Barriers: CSW to assist with the development of appropriate discharge plan.  Update 07/26/24: No changes at this time Update 08/01/24: APS Report made for pt, screened it. Update 08/06/24: FL2 sent to [facility]. Pt is not placement. [facility] and APS to assist with placement. 08/11/24 Update: CSW to continue to support [facility] in working toward placement for patient.  Update 08/16/24: No changes at this time. Update 12/262025:  No changes at this time. Update 08/26/2024:  No changes at this time. Update 08/31/2024:  Pt awarded interim guardianship at this time, guardianship court date scheduled for 09/07/2024.  [facility] at [employer] report no updates on placement at this time. 09/05/2024 no changes  Reason for Continuation of Hospitalization: Aggression Anxiety Delusions  Depression Hallucinations  Estimated Length of Stay:TBD  Last 3 [place] Suicide Severity Risk Score: Flowsheet Row Admission (Current) from 07/18/2024 in [facility] ED from 07/17/2024 in [facility] ED to Hosp-Admission (Discharged) from 02/20/2024 in [facility]CCU  C-SSRS RISK CATEGORY No Risk No Risk No Risk    Last PHQ 2/9 Scores:     No data to display          Scribe for Treatment Team: Pamila Nine, KEN 09/05/2024 1:56 PM

## 2024-09-05 NOTE — Group Note (Signed)
 Date:  09/05/2024 Time:  4:03 PM  Group Topic/Focus:  Managing Feelings:   The focus of this group is to identify what feelings patients have difficulty handling and develop a plan to handle them in a healthier way upon discharge.    Participation Level:  Did Not Attend   Arland Nutting 09/05/2024, 4:03 PM

## 2024-09-05 NOTE — Group Note (Signed)
 Date:  09/05/2024 Time:  3:01 PM  Group Topic/Focus:  Movement Therapy    Participation Level:  Did Not Attend    [name] 09/05/2024, 3:01 PM

## 2024-09-05 NOTE — Plan of Care (Signed)
   Problem: Activity: Goal: Interest or engagement in activities will improve Outcome: Progressing

## 2024-09-05 NOTE — Progress Notes (Signed)
 [facility] MD Progress Note  09/05/2024 1:11 PM Miguel [name]  MRN:  [id number]  Miguel [name] is a 74 year old male with a past psychiatric history significant for schizophrenia who presents under involuntary commitment initiated by family due to medication non-adherence, increasing paranoia, and behavioral disorganization. 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 Brother reports multiple episodes of patient whispering through the night, stating people were talking about him and planning to harm him. Patient was advised by his ACT Team to come to the ED; he refused, prompting the family to pursue IVC.Patient is admitted to [facility] unit with Q15 min safety monitoring. Multidisciplinary team approach is offered. Medication management; group/milieu therapy is offered.   Subjective:  Chart reviewed, case discussed in multidisciplinary meeting, patient seen during rounds.   Patient is noted to be sitting in the chair and dozing off.  He remains disorganized in his thought process but offers no complaints.  She reports fair appetite and sleep.  She continues to complain about staff and his family taking his staff and not listening to him.  Patient denies SI/HI/plan.  Patient denies auditory/visual hallucinations.  Per nursing patient is taking his medications and is noted to be little tired.  Patient's blood pressure is improved from yesterday upon hydration Past Psychiatric History: see h&P Family History:  Family History  Family history unknown: Yes   Social History:  Social History    Substance and Sexual Activity  Alcohol Use No     Social History   Substance and Sexual Activity  Drug Use No    Social History   Socioeconomic History   Marital status: Divorced    Spouse name: Not on file   Number of children: Not on file   Years of education: Not on file   Highest education level: Not on file  Occupational History   Not on file  Tobacco Use   Smoking status: Every Day    Current packs/day: 0.50    Average packs/day: 0.5 packs/day for 46.0 years (23.0 ttl pk-yrs)    Types: Cigarettes    Start date: 40   Smokeless tobacco: Never  Vaping Use   Vaping status: Never Used  Substance and Sexual Activity   Alcohol use: No   Drug use: No   Sexual activity: Not on file  Other Topics Concern   Not on file  Social History Narrative   Not on file   Social Drivers of Health   Tobacco Use: High Risk (07/18/2024)   Patient History    Smoking Tobacco Use: Every Day    Smokeless Tobacco Use: Never    Passive Exposure: Not on file  Financial Resource Strain: Not on file  Food Insecurity: Patient Unable To Answer (07/18/2024)   Epic    Worried About Programme Researcher, Broadcasting/film/video in the Last Year: Patient unable to answer    Ran Out of Food in the Last Year: Patient unable to answer  Transportation Needs: Unmet Transportation Needs (07/18/2024)   Epic    Lack of Transportation (Medical): Yes    Lack of Transportation (Non-Medical): Yes  Physical Activity: Not on file  Stress: Not on file  Social Connections: Patient Unable To Answer (07/18/2024)  Social Connection and Isolation Panel    Frequency of Communication with Friends and Family: Patient unable to answer    Frequency of Social Gatherings with Friends and Family: Patient unable to answer    Attends Religious Services: Patient unable to answer    Active Member of Clubs or Organizations: Patient unable to answer    Attends Banker Meetings: Patient unable to answer    Marital Status: Patient  unable to answer  Depression (PHQ2-9): Not on file  Alcohol Screen: Low Risk (07/18/2024)   Alcohol Screen    Last Alcohol Screening Score (AUDIT): 0  Housing: Unknown (07/18/2024)   Epic    Unable to Pay for Housing in the Last Year: Patient unable to answer    Number of Times Moved in the Last Year: 0    Homeless in the Last Year: Patient declined  Utilities: Patient Unable To Answer (07/18/2024)   Epic    Threatened with loss of utilities: Patient unable to answer  Health Literacy: Not on file   Past Medical History:  Past Medical History:  Diagnosis Date   Biventricular failure (HCC)    CVA (cerebral vascular accident) (HCC)    Polysubstance use disorder    Schizophrenia (HCC)     Past Surgical History:  Procedure Laterality Date   APPENDECTOMY     TRACHEOSTOMY TUBE PLACEMENT N/A 03/04/2024   Procedure: CREATION, TRACHEOSTOMY;  Surgeon: [name] Massie RAMAN, MD;  Location: [facility] ORS;  Service: ENT;  Laterality: N/A;    Current Medications: Current Facility-Administered Medications  Medication Dose Route Frequency Provider Last Rate Last Admin   acetaminophen  (TYLENOL ) tablet 650 mg  650 mg Oral Q6H PRN Hampton, Tracie B, NP   650 mg at 08/30/24 1928   alum & mag hydroxide-simeth (MAALOX/MYLANTA) 200-200-20 MG/5ML suspension 30 mL  30 mL Oral Q4H PRN Hampton, Tracie B, NP   30 mL at 08/05/24 0306   amiodarone  (PACERONE ) tablet 400 mg  400 mg Oral Daily [name], [name], MD   400 mg at 09/05/24 9170   ARIPiprazole  ER (ABILIFY  MAINTENA) injection 400 mg  400 mg Intramuscular Q28 days Zacharias Ridling, MD   400 mg at 08/24/24 9065   benzocaine  (ORAJEL) 10 % mucosal gel   Mouth/Throat TID PRN [name], [name], MD       divalproex  (DEPAKOTE  ER) 24 hr tablet 1,000 mg  1,000 mg Oral QHS Leala Bryand, MD   1,000 mg at 09/04/24 2033   docusate sodium  (COLACE) capsule 100 mg  100 mg Oral Daily Remy Voiles, MD   100 mg at 09/05/24 0829   feeding supplement (ENSURE PLUS HIGH PROTEIN)  liquid 237 mL  237 mL Oral TID BM [name], [name], MD   237 mL at 09/05/24 0830   hydrOXYzine  (ATARAX ) tablet 25 mg  25 mg Oral TID PRN Hampton, Tracie B, NP   25 mg at 08/30/24 2132   magnesium  hydroxide (MILK OF MAGNESIA) suspension 30 mL  30 mL Oral Daily PRN Hampton, Tracie B, NP   30 mL at 08/08/24 2233   memantine  (NAMENDA ) tablet 10 mg  10 mg Oral Daily Magdaleno Lortie, MD   10 mg at 09/05/24 9170   metoprolol  tartrate (LOPRESSOR ) tablet 12.5 mg  12.5 mg Oral BID [name], [name], MD   12.5 mg at 09/05/24 0829   naproxen  (NAPROSYN ) tablet 250 mg  250 mg Oral BID WC Sovereign Ramiro, MD   250 mg at 09/05/24 0827   OLANZapine  (ZYPREXA ) injection 5 mg  5 mg Intramuscular TID PRN Hampton, Tracie B, NP       OLANZapine  zydis (ZYPREXA ) disintegrating tablet 5 mg  5 mg Oral TID PRN Hampton, Tracie B, NP   5 mg at 08/04/24 1129   oxyCODONE  (Oxy IR/ROXICODONE ) immediate release tablet 5 mg  5 mg Oral Q4H PRN Dorinda Homans T, MD   5 mg at 09/01/24 2102   pantoprazole  (PROTONIX ) EC tablet 40 mg  40 mg Oral Daily [name], [name], MD   40 mg at 09/05/24 9170   sodium chloride  (OCEAN) 0.65 % nasal spray 1 spray  1 spray Each Nare PRN Celesta Funderburk, MD   1 spray at 08/18/24 1459   traZODone  (DESYREL ) tablet 50 mg  50 mg Oral QHS PRN Hampton, Tracie B, NP   50 mg at 09/04/24 2121    Lab Results:  No results found for this or any previous visit (from the past 48 hours).     Blood Alcohol level:  Lab Results  Component Value Date   [facility] <15 07/17/2024   ETH <15 02/20/2024    Metabolic Disorder Labs: Lab Results  Component Value Date   HGBA1C 5.4 08/12/2024   MPG 108.28 08/12/2024   MPG 119.76 07/14/2018   No results found for: PROLACTIN Lab Results  Component Value Date   CHOL 139 08/12/2024   TRIG 73 08/12/2024   HDL 52 08/12/2024   CHOLHDL 2.7 08/12/2024   VLDL 15 08/12/2024   LDLCALC 72 08/12/2024   LDLCALC 119 (H) 07/13/2018    Physical Findings: AIMS:  , ,  ,  ,     CIWA:    COWS:      Psychiatric Specialty Exam:  Presentation  General Appearance: Appropriate for Environment  Eye Contact:Fleeting  Speech:Normal Rate  Speech Volume:Decreased    Mood and Affect  Mood:Anxious  Affect:Flat   Thought Process  Thought Processes:circumstantial Orientation:Partial  Thought Content:discharge focused  Hallucinations:denies  Ideas of Reference:Delusions At baseline Suicidal Thoughts:denies  Homicidal Thoughts:denies   Sensorium  Memory:Immediate Poor; Recent Poor; Remote Poor  Judgment:Impaired  Insight:None   Executive Functions  Concentration:Poor  Attention Span:Poor  Recall:Poor  Fund of Knowledge:Poor  Language:Fair   Psychomotor Activity  Psychomotor Activity:No data recorded  Musculoskeletal: Strength & Muscle Tone: within normal limits Gait & Station: normal Assets  Assets:Communication Skills; Desire for Improvement; Social Support    Physical Exam: Physical Exam Vitals and nursing note reviewed.    ROS Blood pressure 129/71, pulse 76, temperature (!) 97.5 F (36.4 C), resp. rate 16, height 6' 1 (1.854 m), weight 81.2 kg, SpO2 95%. Body mass index is 23.62 kg/m.  Diagnosis: Active Problems:   Schizophrenia, paranoid (HCC)   Dementia with behavioral disturbance (HCC)   PLAN: Safety and Monitoring:  -- Involuntary admission to inpatient psychiatric unit for safety, stabilization and treatment  -- Daily contact with patient to assess and evaluate symptoms and progress in treatment  -- Patient's case to be discussed in multi-disciplinary team meeting  -- Observation Level : q15 minute checks  -- Vital signs:  q12 hours  -- Precautions: suicide, elopement, and assault -- Encouraged patient to participate in unit milieu and in scheduled group therapies  2. Psychiatric Treatment:  Scheduled Medications:  Discontinued oral Abilify  LAY Abilify  Maintena 400 mg q4 weeks given on 07/27/24,  08/24/2024  Cogentin  1mg  one dose given for EPS Depakote  ER increased to 1000 mg to help with mood stabilization  Depakote  levels 09/02/24: 57  -- The risks/benefits/side-effects/alternatives to this  medication were discussed in detail with the patient and time was given for questions. The patient consents to medication trial.  3. Medical Issues Being Addressed:  [facility] consulted for wound care management   Assessment and Plan:   Sacral Decubitus Ulcer Full thickness decubitus ulcer in setting of recent extended hospitalization, discharged from [facility] 14[facility] Wound looks pretty good overall WOC on board we appreciate input   Cognitive Deficit Concern for anoxic brain injury at discharge 02/2024 Delirium precautions   Peripheral Vascular Disease Legs with features c/w peripheral vascular disease, diminished DP pulses Would benefit from outpatient ABI's   HFrEF Euvolemic at this time Continue home medications   Hx Atrial Flutter Not on any anticoagulation at this time EKG with NSR Continue amiodarone   Hypertension Noted, adjust meds gradually after med rec complete   Tooth pain/Infection - Added: Augmentin  along with Orajel BP -better today no longer hypotensive-Will continue reduced dose of metoprolol  12.5 bid.   4. Discharge Planning:   -- Social work and case management to assist with discharge planning and identification of hospital follow-up needs prior to discharge  -- Estimated LOS: 3-4 days  Allyn Foil, MD 09/05/2024, 1:11 PM

## 2024-09-05 NOTE — Group Note (Signed)
 Date:  09/05/2024 Time:  10:55 AM  Group Topic/Focus:  Coping With Mental Health Crisis:   The purpose of this group is to help patients identify strategies for coping with mental health crisis.  Group discusses possible causes of crisis and ways to manage them effectively.    Participation Level:  Did Not Attend   Arland Nutting 09/05/2024, 10:55 AM

## 2024-09-06 DIAGNOSIS — F03918 Unspecified dementia, unspecified severity, with other behavioral disturbance: Secondary | ICD-10-CM | POA: Diagnosis not present

## 2024-09-06 DIAGNOSIS — F200 Paranoid schizophrenia: Secondary | ICD-10-CM | POA: Diagnosis not present

## 2024-09-06 NOTE — Group Note (Signed)
 Date:  09/06/2024 Time:  10:58 AM  Group Topic/Focus:  Self Care:   The focus of this group is to help patients understand the importance of self-care in order to improve or restore emotional, physical, spiritual, interpersonal, and financial health.    Participation Level:  Did Not Attend  Participation Quality:     Affect:     Cognitive:     Insight: None  Engagement in Group:     Modes of Intervention:     Additional Comments:    Alianny Toelle C Dejuana Weist 09/06/2024, 10:58 AM

## 2024-09-06 NOTE — Plan of Care (Signed)
" °  Problem: Education: Goal: Knowledge of [place] [facility] Education information/materials will improve Outcome: Progressing   Problem: Activity: Goal: Interest or engagement in activities will improve Outcome: Progressing   Problem: Coping: Goal: Ability to verbalize frustrations and anger appropriately will improve Outcome: Progressing   Problem: Education: Goal: Knowledge of [place] [facility] Education information/materials will improve Outcome: Progressing Goal: Emotional status will improve Outcome: Progressing Goal: Mental status will improve Outcome: Progressing Goal: Verbalization of understanding the information provided will improve Outcome: Progressing   Problem: Activity: Goal: Interest or engagement in activities will improve Outcome: Progressing Goal: Sleeping patterns will improve Outcome: Progressing   Problem: Coping: Goal: Ability to verbalize frustrations and anger appropriately will improve Outcome: Progressing Goal: Ability to demonstrate self-control will improve Outcome: Progressing   "

## 2024-09-06 NOTE — Group Note (Signed)
 [facility] LCSW Group Therapy Note   Group Date: 09/06/2024 Start Time: 1100 End Time: 1130  Type of Therapy and Topic:  Group Therapy:  Feelings around Relapse and Recovery  Participation Level:  Active   Mood: pleasant, calm  Description of Group:    Patients in this group will discuss emotions they experience before and after a relapse. They will process how experiencing these feelings, or avoidance of experiencing them, relates to having a relapse. Facilitator will guide patients to explore emotions they have related to recovery. Patients will be encouraged to process which emotions are more powerful. They will be guided to discuss the emotional reaction significant others in their lives may have to patients relapse or recovery. Patients will be assisted in exploring ways to respond to the emotions of others without this contributing to a relapse.  Therapeutic Goals: Patient will identify two or more emotions that lead to relapse for them:  Patient will identify two emotions that result when they relapse:  Patient will identify two emotions related to recovery:  Patient will demonstrate ability to communicate their needs through discussion and/or role plays.   Summary of Patient Progress:  Emori was very active throughtout the session.  Nathyn demonstrated insight into the subject matter and was open to input from peers and feedback from CSW.  Arvid was respectful of peers and participated throughout the entire session.   Therapeutic Modalities:   Cognitive Behavioral Therapy Solution-Focused Therapy Assertiveness Training Relapse Prevention Therapy   Rexene LELON Mae, LCSWA

## 2024-09-06 NOTE — Plan of Care (Signed)
" °  Problem: Coping: Goal: Ability to verbalize frustrations and anger appropriately will improve Outcome: Not Progressing   Problem: Education: Goal: Knowledge of [place] [facility] Education information/materials will improve Outcome: Not Progressing Goal: Emotional status will improve Outcome: Not Progressing   Problem: Activity: Goal: Interest or engagement in activities will improve Outcome: Not Progressing Goal: Sleeping patterns will improve Outcome: Not Progressing   Problem: Coping: Goal: Ability to adjust to condition or change in health will improve Outcome: Not Progressing   Problem: Health Behavior/Discharge Planning: Goal: Ability to identify and utilize available resources and services will improve Outcome: Not Progressing   "

## 2024-09-06 NOTE — Progress Notes (Signed)
 [facility] MD Progress Note  09/06/2024 1:14 PM Miguel [name]  MRN:  [id number]  Miguel [name] is a 74 year old male with a past psychiatric history significant for schizophrenia who presents under involuntary commitment initiated by family due to medication non-adherence, increasing paranoia, and behavioral disorganization. 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 Brother reports multiple episodes of patient whispering through the night, stating people were talking about him and planning to harm him. Patient was advised by his ACT Team to come to the ED; he refused, prompting the family to pursue IVC.Patient is admitted to [facility] unit with Q15 min safety monitoring. Multidisciplinary team approach is offered. Medication management; group/milieu therapy is offered.   Subjective:  Chart reviewed, case discussed in multidisciplinary meeting, patient seen during rounds.   Patient comes out of the day room and wants to talk to this provider today.  He wanted to know about his family his brother, his sister and his mother.  When provider informed that last time the provider spoke to them was a week ago but they all are doing well patient and reports and informs the provider that last night they all got shot today and.  When provider asked how he is aware of that he reports that somebody told him last night that his family issue wanted.  Patient remains delusional and disorganized in his thought process.  Per nursing patient is taking his medications.  His blood pressure has been low and  they are holding his blood pressure medications at times. Past Psychiatric History: see h&P Family History:  Family History  Family history unknown: Yes   Social History:  Social History   Substance and Sexual Activity  Alcohol Use No     Social History   Substance and Sexual Activity  Drug Use No    Social History   Socioeconomic History   Marital status: Divorced    Spouse name: Not on file   Number of children: Not on file   Years of education: Not on file   Highest education level: Not on file  Occupational History   Not on file  Tobacco Use   Smoking status: Every Day    Current packs/day: 0.50    Average packs/day: 0.5 packs/day for 46.0 years (23.0 ttl pk-yrs)    Types: Cigarettes    Start date: 80   Smokeless tobacco: Never  Vaping Use   Vaping status: Never Used  Substance and Sexual Activity   Alcohol use: No   Drug use: No   Sexual activity: Not on file  Other Topics Concern   Not on file  Social History Narrative   Not on file   Social Drivers of Health   Tobacco Use: High Risk (07/18/2024)   Patient History    Smoking Tobacco Use: Every Day    Smokeless Tobacco Use: Never    Passive Exposure: Not on file  Financial Resource Strain: Not on file  Food Insecurity: Patient Unable To Answer (07/18/2024)   Epic    Worried About Programme Researcher, Broadcasting/film/video in the Last Year: Patient unable to answer    Ran Out of Food in the Last Year: Patient unable to answer  Transportation  Needs: Unmet Transportation Needs (07/18/2024)   Epic    Lack of Transportation (Medical): Yes    Lack of Transportation (Non-Medical): Yes  Physical Activity: Not on file  Stress: Not on file  Social Connections: Patient Unable To Answer (07/18/2024)   Social Connection and Isolation Panel    Frequency of Communication with Friends and Family: Patient unable to answer    Frequency of Social Gatherings with Friends and Family: Patient unable to answer    Attends Religious  Services: Patient unable to answer    Active Member of Clubs or Organizations: Patient unable to answer    Attends Banker Meetings: Patient unable to answer    Marital Status: Patient unable to answer  Depression (PHQ2-9): Not on file  Alcohol Screen: Low Risk (07/18/2024)   Alcohol Screen    Last Alcohol Screening Score (AUDIT): 0  Housing: Unknown (07/18/2024)   Epic    Unable to Pay for Housing in the Last Year: Patient unable to answer    Number of Times Moved in the Last Year: 0    Homeless in the Last Year: Patient declined  Utilities: Patient Unable To Answer (07/18/2024)   Epic    Threatened with loss of utilities: Patient unable to answer  Health Literacy: Not on file   Past Medical History:  Past Medical History:  Diagnosis Date   Biventricular failure (HCC)    CVA (cerebral vascular accident) (HCC)    Polysubstance use disorder    Schizophrenia (HCC)     Past Surgical History:  Procedure Laterality Date   APPENDECTOMY     TRACHEOSTOMY TUBE PLACEMENT N/A 03/04/2024   Procedure: CREATION, TRACHEOSTOMY;  Surgeon: [name] Massie RAMAN, MD;  Location: [facility] ORS;  Service: ENT;  Laterality: N/A;    Current Medications: Current Facility-Administered Medications  Medication Dose Route Frequency Provider Last Rate Last Admin   acetaminophen  (TYLENOL ) tablet 650 mg  650 mg Oral Q6H PRN Hampton, Tracie B, NP   650 mg at 08/30/24 1928   alum & mag hydroxide-simeth (MAALOX/MYLANTA) 200-200-20 MG/5ML suspension 30 mL  30 mL Oral Q4H PRN Hampton, Tracie B, NP   30 mL at 08/05/24 0306   amiodarone  (PACERONE ) tablet 400 mg  400 mg Oral Daily [name], [name], MD   400 mg at 09/05/24 0829   ARIPiprazole  ER (ABILIFY  MAINTENA) injection 400 mg  400 mg Intramuscular Q28 days Keimon Basaldua, MD   400 mg at 08/24/24 9065   benzocaine  (ORAJEL) 10 % mucosal gel   Mouth/Throat TID PRN [name], [name], MD       divalproex  (DEPAKOTE  ER) 24 hr tablet 1,000 mg  1,000 mg Oral QHS  Javed Cotto, MD   1,000 mg at 09/05/24 2112   docusate sodium  (COLACE) capsule 100 mg  100 mg Oral Daily Cyree Chuong, MD   100 mg at 09/06/24 1040   feeding supplement (ENSURE PLUS HIGH PROTEIN) liquid 237 mL  237 mL Oral TID BM [name], [name], MD   237 mL at 09/06/24 1041   hydrOXYzine  (ATARAX ) tablet 25 mg  25 mg Oral TID PRN Hampton, Tracie B, NP   25 mg at 09/05/24 2113   magnesium  hydroxide (MILK OF MAGNESIA) suspension 30 mL  30 mL Oral Daily PRN Hampton, Tracie B, NP   30 mL at 08/08/24 2233   memantine  (NAMENDA ) tablet 10 mg  10 mg Oral Daily Floraine Buechler, MD   10 mg at 09/06/24 1040   metoprolol  tartrate (LOPRESSOR ) tablet 12.5 mg  12.5 mg Oral BID [name], [name], MD   12.5 mg at 09/05/24 2112   naproxen  (NAPROSYN ) tablet 250 mg  250 mg Oral BID WC Caelum Federici, MD   250 mg at 09/06/24 0749   OLANZapine  (ZYPREXA ) injection 5 mg  5 mg Intramuscular TID PRN Hampton, Tracie B, NP       OLANZapine  zydis (ZYPREXA ) disintegrating tablet 5 mg  5 mg Oral TID PRN Hampton, Tracie B, NP   5 mg at 08/04/24 1129   oxyCODONE  (Oxy IR/ROXICODONE ) immediate release tablet 5 mg  5 mg Oral Q4H PRN Dorinda Homans T, MD   5 mg at 09/01/24 2102   pantoprazole  (PROTONIX ) EC tablet 40 mg  40 mg Oral Daily [name], [name], MD   40 mg at 09/06/24 1040   sodium chloride  (OCEAN) 0.65 % nasal spray 1 spray  1 spray Each Nare PRN Jazalyn Mondor, MD   1 spray at 08/18/24 1459   traZODone  (DESYREL ) tablet 50 mg  50 mg Oral QHS PRN Hampton, Tracie B, NP   50 mg at 09/05/24 2113    Lab Results:  No results found for this or any previous visit (from the past 48 hours).     Blood Alcohol level:  Lab Results  Component Value Date   [facility] <15 07/17/2024   ETH <15 02/20/2024    Metabolic Disorder Labs: Lab Results  Component Value Date   HGBA1C 5.4 08/12/2024   MPG 108.28 08/12/2024   MPG 119.76 07/14/2018   No results found for: PROLACTIN Lab Results  Component Value Date   CHOL 139  08/12/2024   TRIG 73 08/12/2024   HDL 52 08/12/2024   CHOLHDL 2.7 08/12/2024   VLDL 15 08/12/2024   LDLCALC 72 08/12/2024   LDLCALC 119 (H) 07/13/2018    Physical Findings: AIMS:  , ,  ,  ,    CIWA:    COWS:      Psychiatric Specialty Exam:  Presentation  General Appearance: Appropriate for Environment  Eye Contact:Fleeting  Speech:Normal Rate  Speech Volume:Decreased    Mood and Affect  Mood:Anxious  Affect:Flat   Thought Process  Thought Processes:circumstantial Orientation:Partial  Thought Content:discharge focused  Hallucinations:denies  Ideas of Reference:Delusions At baseline Suicidal Thoughts:denies  Homicidal Thoughts:denies   Sensorium  Memory:Immediate Poor; Recent Poor; Remote Poor  Judgment:Impaired  Insight:None   Executive Functions  Concentration:Poor  Attention Span:Poor  Recall:Poor  Fund of Knowledge:Poor  Language:Fair   Psychomotor Activity  Psychomotor Activity:No data recorded  Musculoskeletal: Strength & Muscle Tone: within normal limits Gait & Station: normal Assets  Assets:Communication Skills; Desire for Improvement; Social Support    Physical Exam: Physical Exam Vitals and nursing note reviewed.    ROS Blood pressure (!) 118/53, pulse (!) 57, temperature 98.1 F (36.7 C), resp. rate 16, height 6' 1 (1.854 m), weight 81.2 kg, SpO2 100%. Body mass index is 23.62 kg/m.  Diagnosis: Active Problems:   Schizophrenia, paranoid (HCC)   Dementia with behavioral disturbance (HCC)   PLAN: Safety and Monitoring:  -- Involuntary admission to inpatient psychiatric unit for safety, stabilization and treatment  -- Daily contact with patient to assess and evaluate symptoms and progress in treatment  -- Patient's case to be discussed in multi-disciplinary team meeting  -- Observation Level : q15 minute checks  -- Vital signs:  q12 hours  -- Precautions: suicide, elopement, and assault -- Encouraged  patient to participate in unit milieu and in scheduled group therapies  2. Psychiatric Treatment:  Scheduled  Medications:  Discontinued oral Abilify  LAY Abilify  Maintena 400 mg q4 weeks given on 07/27/24, 08/24/2024  Cogentin  1mg  one dose given for EPS Depakote  ER increased to 1000 mg to help with mood stabilization  Depakote  levels 09/02/24: 57  -- The risks/benefits/side-effects/alternatives to this medication were discussed in detail with the patient and time was given for questions. The patient consents to medication trial.  3. Medical Issues Being Addressed:  [facility] consulted for wound care management   Assessment and Plan:   Sacral Decubitus Ulcer Full thickness decubitus ulcer in setting of recent extended hospitalization, discharged from [facility] Nov 14th Wound looks pretty good overall WOC on board we appreciate input   Cognitive Deficit Concern for anoxic brain injury at discharge 02/2024 Delirium precautions   Peripheral Vascular Disease Legs with features c/w peripheral vascular disease, diminished DP pulses Would benefit from outpatient ABI's   HFrEF Euvolemic at this time Continue home medications   Hx Atrial Flutter Not on any anticoagulation at this time EKG with NSR Continue amiodarone   Hypertension Noted, adjust meds gradually after med rec complete   Tooth pain/Infection - Added: Augmentin  along with Orajel BP -better today no longer hypotensive-Will continue reduced dose of metoprolol  12.5 bid.   4. Discharge Planning:   -- Social work and case management to assist with discharge planning and identification of hospital follow-up needs prior to discharge  -- Estimated LOS: 3-4 days  Carold Eisner, MD 09/06/2024, 1:14 PM

## 2024-09-06 NOTE — Group Note (Signed)
 Date:  09/06/2024 Time:  9:28 PM  Group Topic/Focus:  Wrap-Up Group:   The focus of this group is to help patients review their daily goal of treatment and discuss progress on daily workbooks.    Participation Level:  Active  Participation Quality:  Appropriate  Affect:  Appropriate  Cognitive:  Alert  Insight: Appropriate  Engagement in Group:  Engaged  Modes of Intervention:  Discussion  Additional Comments:    [name] 09/06/2024, 9:28 PM

## 2024-09-06 NOTE — Progress Notes (Signed)
 Patient pleasant and cooperative during interaction. Accepted medications without issue. Required direction and reminders for forgetting he has already had meals and medications. Easily redirected. Wound care. Patient in dayroom for much of the day. Ongoing monitoring continues.   09/06/24 1100  Psych Admission Type (Psych Patients Only)  Admission Status Involuntary  Psychosocial Assessment  Patient Complaints None  Eye Contact Fair  Facial Expression Animated  Affect Appropriate to circumstance  Speech Soft  Interaction Assertive  Motor Activity Slow;Shuffling  Appearance/Hygiene Unremarkable  Behavior Characteristics Appropriate to situation  Mood Pleasant  Thought Process  Coherency Tangential  Content Preoccupation  Delusions Paranoid  Perception WDL  Hallucination None reported or observed  Judgment Impaired  Confusion Mild  Danger to Self  Current suicidal ideation? Denies

## 2024-09-06 NOTE — Group Note (Signed)
 Date:  09/06/2024 Time:  3:18 PM  Group Topic/Focus:  House rules and unit expectations    Participation Level:  Did Not Attend    [name] 09/06/2024, 3:18 PM

## 2024-09-07 DIAGNOSIS — F03918 Unspecified dementia, unspecified severity, with other behavioral disturbance: Secondary | ICD-10-CM | POA: Diagnosis not present

## 2024-09-07 DIAGNOSIS — F200 Paranoid schizophrenia: Secondary | ICD-10-CM | POA: Diagnosis not present

## 2024-09-07 NOTE — Group Note (Signed)
 Date:  09/07/2024 Time:  11:01 PM  Group Topic/Focus:  Wrap-Up Group:   The focus of this group is to help patients review their daily goal of treatment and discuss progress on daily workbooks.  Boundaries Group  Participation Level:  Active  Participation Quality:  Appropriate  Affect:  Appropriate  Cognitive:  Appropriate  Insight: Appropriate  Engagement in Group:  Engaged  Modes of Intervention:  Support  Additional Comments:    Gwendlyn LITTIE Sharps 09/07/2024, 11:01 PM

## 2024-09-07 NOTE — Plan of Care (Signed)
  Problem: Activity: Goal: Interest or engagement in activities will improve Outcome: Progressing   Problem: Education: Goal: Mental status will improve Outcome: Not Progressing Goal: Verbalization of understanding the information provided will improve Outcome: Not Progressing   

## 2024-09-07 NOTE — Progress Notes (Signed)
 [facility] MD Progress Note  09/07/2024 1:37 PM LEIAM HOPWOOD  MRN:  [id number]  Dreyton Roessner. Colavito is a 74 year old male with a past psychiatric history significant for schizophrenia who presents under involuntary commitment initiated by family due to medication non-adherence, increasing paranoia, and behavioral disorganization. 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 Brother reports multiple episodes of patient whispering through the night, stating people were talking about him and planning to harm him. Patient was advised by his ACT Team to come to the ED; he refused, prompting the family to pursue IVC.Patient is admitted to [facility] unit with Q15 min safety monitoring. Multidisciplinary team approach is offered. Medication management; group/milieu therapy is offered.   Subjective:  Chart reviewed, case discussed in multidisciplinary meeting, patient seen during rounds.  Patient is noted to be resting in bed.  He reports feeling tired and sleepy.  He remains disorganized in his thought process.  He denies SI/HI/plan and denies hallucinations.  Patient has fair appetite and sleep.  Patient is taking his medications and responding well with no reported side effects  Past Psychiatric History: see h&P Family History:  Family History  Family history unknown: Yes   Social History:  Social History   Substance and Sexual Activity  Alcohol Use No     Social History   Substance and Sexual Activity  Drug Use No    Social History   Socioeconomic History   Marital status: Divorced     Spouse name: Not on file   Number of children: Not on file   Years of education: Not on file   Highest education level: Not on file  Occupational History   Not on file  Tobacco Use   Smoking status: Every Day    Current packs/day: 0.50    Average packs/day: 0.5 packs/day for 46.0 years (23.0 ttl pk-yrs)    Types: Cigarettes    Start date: 49   Smokeless tobacco: Never  Vaping Use   Vaping status: Never Used  Substance and Sexual Activity   Alcohol use: No   Drug use: No   Sexual activity: Not on file  Other Topics Concern   Not on file  Social History Narrative   Not on file   Social Drivers of Health   Tobacco Use: High Risk (07/18/2024)   Patient History    Smoking Tobacco Use: Every Day    Smokeless Tobacco Use: Never    Passive Exposure: Not on file  Financial Resource Strain: Not on file  Food Insecurity: Patient Unable To Answer (07/18/2024)   Epic    Worried About Programme Researcher, Broadcasting/film/video in the Last Year: Patient unable to answer    Ran Out of Food in the Last Year: Patient unable to answer  Transportation Needs: Unmet Transportation Needs (07/18/2024)   Epic    Lack of Transportation (Medical): Yes    Lack of Transportation (Non-Medical): Yes  Physical Activity: Not on file  Stress: Not on file  Social Connections: Patient Unable To Answer (07/18/2024)   Social Connection and Isolation Panel    Frequency of Communication with Friends and Family: Patient unable to answer    Frequency of Social Gatherings with Friends and Family:  Patient unable to answer    Attends Religious Services: Patient unable to answer    Active Member of Clubs or Organizations: Patient unable to answer    Attends Club or Organization Meetings: Patient unable to answer    Marital Status: Patient unable to answer  Depression (PHQ2-9): Not on file  Alcohol Screen: Low Risk (07/18/2024)   Alcohol Screen    Last Alcohol Screening Score (AUDIT): 0  Housing: Unknown (07/18/2024)   Epic     Unable to Pay for Housing in the Last Year: Patient unable to answer    Number of Times Moved in the Last Year: 0    Homeless in the Last Year: Patient declined  Utilities: Patient Unable To Answer (07/18/2024)   Epic    Threatened with loss of utilities: Patient unable to answer  Health Literacy: Not on file   Past Medical History:  Past Medical History:  Diagnosis Date   Biventricular failure (HCC)    CVA (cerebral vascular accident) (HCC)    Polysubstance use disorder    Schizophrenia (HCC)     Past Surgical History:  Procedure Laterality Date   APPENDECTOMY     TRACHEOSTOMY TUBE PLACEMENT N/A 03/04/2024   Procedure: CREATION, TRACHEOSTOMY;  Surgeon: [name] Massie RAMAN, MD;  Location: [facility] ORS;  Service: ENT;  Laterality: N/A;    Current Medications: Current Facility-Administered Medications  Medication Dose Route Frequency Provider Last Rate Last Admin   acetaminophen  (TYLENOL ) tablet 650 mg  650 mg Oral Q6H PRN Hampton, Tracie B, NP   650 mg at 08/30/24 1928   alum & mag hydroxide-simeth (MAALOX/MYLANTA) 200-200-20 MG/5ML suspension 30 mL  30 mL Oral Q4H PRN Hampton, Tracie B, NP   30 mL at 08/05/24 0306   amiodarone  (PACERONE ) tablet 400 mg  400 mg Oral Daily [name], [name], MD   400 mg at 09/07/24 0915   ARIPiprazole  ER (ABILIFY  MAINTENA) injection 400 mg  400 mg Intramuscular Q28 days Julya Alioto, MD   400 mg at 08/24/24 9065   benzocaine  (ORAJEL) 10 % mucosal gel   Mouth/Throat TID PRN [name], [name], MD       divalproex  (DEPAKOTE  ER) 24 hr tablet 1,000 mg  1,000 mg Oral QHS Aarron Wierzbicki, MD   1,000 mg at 09/06/24 2053   docusate sodium  (COLACE) capsule 100 mg  100 mg Oral Daily Naythan Douthit, MD   100 mg at 09/07/24 0915   feeding supplement (ENSURE PLUS HIGH PROTEIN) liquid 237 mL  237 mL Oral TID BM [name], [name], MD   237 mL at 09/07/24 0920   hydrOXYzine  (ATARAX ) tablet 25 mg  25 mg Oral TID PRN Hampton, Tracie B, NP   25 mg at 09/05/24 2113   magnesium   hydroxide (MILK OF MAGNESIA) suspension 30 mL  30 mL Oral Daily PRN Hampton, Tracie B, NP   30 mL at 08/08/24 2233   memantine  (NAMENDA ) tablet 10 mg  10 mg Oral Daily Reyes Fifield, MD   10 mg at 09/07/24 9083   metoprolol  tartrate (LOPRESSOR ) tablet 12.5 mg  12.5 mg Oral BID [name], [name], MD   12.5 mg at 09/07/24 9083   naproxen  (NAPROSYN ) tablet 250 mg  250 mg Oral BID WC Kenyon Eichelberger, MD   250 mg at 09/07/24 0915   OLANZapine  (ZYPREXA ) injection 5 mg  5 mg Intramuscular TID PRN Hampton, Tracie B, NP       OLANZapine  zydis (ZYPREXA ) disintegrating tablet 5 mg  5 mg Oral TID PRN Enola,  Tracie B, NP   5 mg at 08/04/24 1129   oxyCODONE  (Oxy IR/ROXICODONE ) immediate release tablet 5 mg  5 mg Oral Q4H PRN Dorinda Homans T, MD   5 mg at 09/01/24 2102   pantoprazole  (PROTONIX ) EC tablet 40 mg  40 mg Oral Daily [name], [name], MD   40 mg at 09/07/24 0916   sodium chloride  (OCEAN) 0.65 % nasal spray 1 spray  1 spray Each Nare PRN Donnelly Mellow, MD   1 spray at 08/18/24 1459   traZODone  (DESYREL ) tablet 50 mg  50 mg Oral QHS PRN Hampton, Tracie B, NP   50 mg at 09/06/24 2053    Lab Results:  No results found for this or any previous visit (from the past 48 hours).     Blood Alcohol level:  Lab Results  Component Value Date   [facility] <15 07/17/2024   ETH <15 02/20/2024    Metabolic Disorder Labs: Lab Results  Component Value Date   HGBA1C 5.4 08/12/2024   MPG 108.28 08/12/2024   MPG 119.76 07/14/2018   No results found for: PROLACTIN Lab Results  Component Value Date   CHOL 139 08/12/2024   TRIG 73 08/12/2024   HDL 52 08/12/2024   CHOLHDL 2.7 08/12/2024   VLDL 15 08/12/2024   LDLCALC 72 08/12/2024   LDLCALC 119 (H) 07/13/2018    Physical Findings: AIMS:  , ,  ,  ,    CIWA:    COWS:      Psychiatric Specialty Exam:  Presentation  General Appearance: Appropriate for Environment  Eye Contact:Fleeting  Speech:Normal Rate  Speech  Volume:Decreased    Mood and Affect  Mood:Anxious  Affect:Flat   Thought Process  Thought Processes:circumstantial Orientation:Partial  Thought Content:discharge focused  Hallucinations:denies  Ideas of Reference:Delusions At baseline Suicidal Thoughts:denies  Homicidal Thoughts:denies   Sensorium  Memory:Immediate Poor; Recent Poor; Remote Poor  Judgment:Impaired  Insight:None   Executive Functions  Concentration:Poor  Attention Span:Poor  Recall:Poor  Fund of Knowledge:Poor  Language:Fair   Psychomotor Activity  Psychomotor Activity:No data recorded  Musculoskeletal: Strength & Muscle Tone: within normal limits Gait & Station: normal Assets  Assets:Communication Skills; Desire for Improvement; Social Support    Physical Exam: Physical Exam Vitals and nursing note reviewed.    ROS Blood pressure 134/89, pulse 70, temperature 97.7 F (36.5 C), resp. rate 18, height 6' 1 (1.854 m), weight 81.2 kg, SpO2 98%. Body mass index is 23.62 kg/m.  Diagnosis: Active Problems:   Schizophrenia, paranoid (HCC)   Dementia with behavioral disturbance (HCC)   PLAN: Safety and Monitoring:  -- Involuntary admission to inpatient psychiatric unit for safety, stabilization and treatment  -- Daily contact with patient to assess and evaluate symptoms and progress in treatment  -- Patient's case to be discussed in multi-disciplinary team meeting  -- Observation Level : q15 minute checks  -- Vital signs:  q12 hours  -- Precautions: suicide, elopement, and assault -- Encouraged patient to participate in unit milieu and in scheduled group therapies  2. Psychiatric Treatment:  Scheduled Medications:  Discontinued oral Abilify  LAY Abilify  Maintena 400 mg q4 weeks given on 07/27/24, 08/24/2024  Cogentin  1mg  one dose given for EPS Depakote  ER increased to 1000 mg to help with mood stabilization  Depakote  levels 09/02/24: 57  -- The  risks/benefits/side-effects/alternatives to this medication were discussed in detail with the patient and time was given for questions. The patient consents to medication trial.  3. Medical Issues Being Addressed:  TRH consulted for  wound care management   Assessment and Plan:   Sacral Decubitus Ulcer Full thickness decubitus ulcer in setting of recent extended hospitalization, discharged from [facility] Nov 14th Wound looks pretty good overall WOC on board we appreciate input   Cognitive Deficit Concern for anoxic brain injury at discharge 02/2024 Delirium precautions   Peripheral Vascular Disease Legs with features c/w peripheral vascular disease, diminished DP pulses Would benefit from outpatient ABI's   HFrEF Euvolemic at this time Continue home medications   Hx Atrial Flutter Not on any anticoagulation at this time EKG with NSR Continue amiodarone   Hypertension Noted, adjust meds gradually after med rec complete   Tooth pain/Infection - Added: Augmentin  along with Orajel BP -better today no longer hypotensive-Will continue reduced dose of metoprolol  12.5 bid.   4. Discharge Planning:   -- Social work and case management to assist with discharge planning and identification of hospital follow-up needs prior to discharge  -- Estimated LOS: 3-4 days  Allyn Foil, MD 09/07/2024, 1:37 PM

## 2024-09-07 NOTE — Progress Notes (Signed)
 Behavior:   Confused.  Pleasant and cooperative.    Psych assessment: Denies SI/HI and AVH.    Group attendance:  1/2   Sleeping during RT group.  Medication/ PRNs: Compliant.   Pain: Denies.  15 min checks in place for safety.    Wound cleaned and dressing changed this morning on previous shift.

## 2024-09-07 NOTE — Group Note (Signed)
 Recreation Therapy Group Note   Group Topic:Stress Management  Group Date: 09/07/2024 Start Time: 1415 End Time: 1440 Facilitators: Celestia Jeoffrey BRAVO, LRT, CTRS Location: Dayroom  Group Description: PMR (Progressive Muscle Relaxation). LRT educates patients on what PMR is and the benefits that come from it. Patients are asked to sit with their feet flat on the floor while sitting up and all the way back in their chair, if possible. LRT and pts follow a prompt through a speaker that requires you to tense and release different muscles in their body and focus on their breathing. During session, lights are off and soft music is being played. Pts are given a stress ball to use if needed.   Goal Area(s) Addressed:  Patients will be able to describe progressive muscle relaxation.  Patient will practice using relaxation technique. Patient will identify a new coping skill.  Patient will follow multistep directions to reduce anxiety and stress.   Affect/Mood: N/A   Participation Level: Minimal    Clinical Observations/Individualized Feedback: Miguel [name] was in and out of sleep while present.   Plan: Continue to engage patient in RT group sessions 2-3x/week.   Jeoffrey BRAVO Celestia, LRT, CTRS 09/07/2024 4:42 PM

## 2024-09-07 NOTE — Plan of Care (Signed)
  Problem: Health Behavior/Discharge Planning: Goal: Identification of resources available to assist in meeting health care needs will improve Outcome: Progressing Goal: Compliance with treatment plan for underlying cause of condition will improve Outcome: Progressing   Problem: Safety: Goal: Periods of time without injury will increase Outcome: Progressing

## 2024-09-07 NOTE — Group Note (Signed)
 Date:  09/07/2024 Time:  10:55 AM  Group Topic/Focus:  Self Care:   The focus of this group is to help patients understand the importance of self-care in order to improve or restore emotional, physical, spiritual, interpersonal, and financial health.    Participation Level:  Did Not Attend  Participation Quality:    Affect:    Cognitive:    Insight:   Engagement in Group:    Modes of Intervention:    Additional Comments:    Miguel [name] 09/07/2024, 10:55 AM

## 2024-09-07 NOTE — BHH Counselor (Signed)
 CSW received message from nursing staff the pt's brother Jahred Tatar, called to speak with CSW.   CSW called pt's brother Cherene back.   Cherene reports he has found a possible placement with [facility] ([facility])   Cherene reports that [facility] has a bed for pt but needs an FL2 sent over.   CSW contacted [facility] ([facility]), to confirm the email to send FL2.   Jasmine at [employer] answered CSW's call and confirmed the email address as:   [email or web address]  CSW will send FL2.   Lum Croft, MSW, [place] 09/07/2024 3:08 PM

## 2024-09-07 NOTE — Plan of Care (Signed)
   Problem: Coping: Goal: Ability to verbalize frustrations and anger appropriately will improve Outcome: Progressing

## 2024-09-08 DIAGNOSIS — F200 Paranoid schizophrenia: Secondary | ICD-10-CM | POA: Diagnosis not present

## 2024-09-08 DIAGNOSIS — F03918 Unspecified dementia, unspecified severity, with other behavioral disturbance: Secondary | ICD-10-CM | POA: Diagnosis not present

## 2024-09-08 NOTE — Group Note (Signed)
 Date:  09/08/2024 Time:  9:42 PM  Group Topic/Focus:  Wrap-Up Group:   The focus of this group is to help patients review their daily goal of treatment and discuss progress on daily workbooks.    Participation Level:  Active  Participation Quality:  Appropriate  Affect:  Appropriate  Cognitive:  Alert  Insight: Appropriate  Engagement in Group:  Engaged  Modes of Intervention:  Discussion  Additional Comments:    [name] 09/08/2024, 9:42 PM

## 2024-09-08 NOTE — Progress Notes (Signed)
 [facility] MD Progress Note  09/08/2024 3:43 PM Miguel [name]  MRN:  [id number]  Miguel [name]. Miguel [name] is a 74 year old male with a past psychiatric history significant for schizophrenia who presents under involuntary commitment initiated by family due to medication non-adherence, increasing paranoia, and behavioral disorganization. 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 Brother reports multiple episodes of patient whispering through the night, stating people were talking about him and planning to harm him. Patient was advised by his ACT Team to come to the ED; he refused, prompting the family to pursue IVC.Patient is admitted to [facility] unit with Q15 min safety monitoring. Multidisciplinary team approach is offered. Medication management; group/milieu therapy is offered.   Subjective:  Chart reviewed, case discussed in multidisciplinary meeting, patient seen during rounds.  Patient is noted to be sleeping in his room.  He had hard time waking up but responded to verbal commands.  He denies SI/HI/plan and denies any confusion or hallucinations.  Per nursing patient remains profoundly confused and gets upset due to his paranoia about the staff and other patients trying to steal from him.  He is coming out of, attending to the groups.  He is taking his medications  Past Psychiatric History: see h&P Family History:  Family History  Family history unknown: Yes   Social History:  Social History   Substance and Sexual Activity  Alcohol Use No     Social History   Substance and Sexual  Activity  Drug Use No    Social History   Socioeconomic History   Marital status: Divorced    Spouse name: Not on file   Number of children: Not on file   Years of education: Not on file   Highest education level: Not on file  Occupational History   Not on file  Tobacco Use   Smoking status: Every Day    Current packs/day: 0.50    Average packs/day: 0.5 packs/day for 46.0 years (23.0 ttl pk-yrs)    Types: Cigarettes    Start date: 75   Smokeless tobacco: Never  Vaping Use   Vaping status: Never Used  Substance and Sexual Activity   Alcohol use: No   Drug use: No   Sexual activity: Not on file  Other Topics Concern   Not on file  Social History Narrative   Not on file   Social Drivers of Health   Tobacco Use: High Risk (07/18/2024)   Patient History    Smoking Tobacco Use: Every Day    Smokeless Tobacco Use: Never    Passive Exposure: Not on file  Financial Resource Strain: Not on file  Food Insecurity: Patient Unable To Answer (07/18/2024)   Epic    Worried About Programme Researcher, Broadcasting/film/video in the Last Year: Patient unable to answer    Ran Out of Food in the Last Year: Patient unable to answer  Transportation Needs: Unmet Transportation Needs (07/18/2024)   Epic    Lack of Transportation (Medical): Yes    Lack of Transportation (Non-Medical): Yes  Physical Activity: Not on file  Stress: Not on file  Social Connections: Patient Unable To Answer (07/18/2024)   Social Connection and Isolation Panel    Frequency  of Communication with Friends and Family: Patient unable to answer    Frequency of Social Gatherings with Friends and Family: Patient unable to answer    Attends Religious Services: Patient unable to answer    Active Member of Clubs or Organizations: Patient unable to answer    Attends Banker Meetings: Patient unable to answer    Marital Status: Patient unable to answer  Depression (PHQ2-9): Not on file  Alcohol Screen: Low Risk (07/18/2024)    Alcohol Screen    Last Alcohol Screening Score (AUDIT): 0  Housing: Unknown (07/18/2024)   Epic    Unable to Pay for Housing in the Last Year: Patient unable to answer    Number of Times Moved in the Last Year: 0    Homeless in the Last Year: Patient declined  Utilities: Patient Unable To Answer (07/18/2024)   Epic    Threatened with loss of utilities: Patient unable to answer  Health Literacy: Not on file   Past Medical History:  Past Medical History:  Diagnosis Date   Biventricular failure (HCC)    CVA (cerebral vascular accident) (HCC)    Polysubstance use disorder    Schizophrenia (HCC)     Past Surgical History:  Procedure Laterality Date   APPENDECTOMY     TRACHEOSTOMY TUBE PLACEMENT N/A 03/04/2024   Procedure: CREATION, TRACHEOSTOMY;  Surgeon: [name] Massie RAMAN, MD;  Location: [facility] ORS;  Service: ENT;  Laterality: N/A;    Current Medications: Current Facility-Administered Medications  Medication Dose Route Frequency Provider Last Rate Last Admin   acetaminophen  (TYLENOL ) tablet 650 mg  650 mg Oral Q6H PRN Hampton, Tracie B, NP   650 mg at 09/07/24 2100   alum & mag hydroxide-simeth (MAALOX/MYLANTA) 200-200-20 MG/5ML suspension 30 mL  30 mL Oral Q4H PRN Hampton, Tracie B, NP   30 mL at 08/05/24 0306   amiodarone  (PACERONE ) tablet 400 mg  400 mg Oral Daily [name], [name], MD   400 mg at 09/08/24 9047   ARIPiprazole  ER (ABILIFY  MAINTENA) injection 400 mg  400 mg Intramuscular Q28 days Frederick Marro, MD   400 mg at 08/24/24 9065   benzocaine  (ORAJEL) 10 % mucosal gel   Mouth/Throat TID PRN [name], [name], MD       divalproex  (DEPAKOTE  ER) 24 hr tablet 1,000 mg  1,000 mg Oral QHS Abe Schools, MD   1,000 mg at 09/07/24 2100   docusate sodium  (COLACE) capsule 100 mg  100 mg Oral Daily Dechelle Attaway, MD   100 mg at 09/08/24 0953   feeding supplement (ENSURE PLUS HIGH PROTEIN) liquid 237 mL  237 mL Oral TID BM [name], [name], MD   237 mL at 09/08/24 1344   hydrOXYzine   (ATARAX ) tablet 25 mg  25 mg Oral TID PRN Hampton, Tracie B, NP   25 mg at 09/07/24 2100   magnesium  hydroxide (MILK OF MAGNESIA) suspension 30 mL  30 mL Oral Daily PRN Hampton, Tracie B, NP   30 mL at 08/08/24 2233   memantine  (NAMENDA ) tablet 10 mg  10 mg Oral Daily Vaudie Engebretsen, MD   10 mg at 09/08/24 9046   metoprolol  tartrate (LOPRESSOR ) tablet 12.5 mg  12.5 mg Oral BID [name], [name], MD   12.5 mg at 09/08/24 0953   naproxen  (NAPROSYN ) tablet 250 mg  250 mg Oral BID WC Davis Ambrosini, MD   250 mg at 09/08/24 0859   OLANZapine  (ZYPREXA ) injection 5 mg  5 mg Intramuscular TID PRN Hampton, Tracie B,  NP       OLANZapine  zydis (ZYPREXA ) disintegrating tablet 5 mg  5 mg Oral TID PRN Hampton, Tracie B, NP   5 mg at 08/04/24 1129   oxyCODONE  (Oxy IR/ROXICODONE ) immediate release tablet 5 mg  5 mg Oral Q4H PRN Dorinda Homans T, MD   5 mg at 09/01/24 2102   pantoprazole  (PROTONIX ) EC tablet 40 mg  40 mg Oral Daily [name], [name], MD   40 mg at 09/08/24 0951   sodium chloride  (OCEAN) 0.65 % nasal spray 1 spray  1 spray Each Nare PRN Donnelly Mellow, MD   1 spray at 08/18/24 1459   traZODone  (DESYREL ) tablet 50 mg  50 mg Oral QHS PRN Hampton, Tracie B, NP   50 mg at 09/07/24 2100    Lab Results:  No results found for this or any previous visit (from the past 48 hours).     Blood Alcohol level:  Lab Results  Component Value Date   [facility] <15 07/17/2024   ETH <15 02/20/2024    Metabolic Disorder Labs: Lab Results  Component Value Date   HGBA1C 5.4 08/12/2024   MPG 108.28 08/12/2024   MPG 119.76 07/14/2018   No results found for: PROLACTIN Lab Results  Component Value Date   CHOL 139 08/12/2024   TRIG 73 08/12/2024   HDL 52 08/12/2024   CHOLHDL 2.7 08/12/2024   VLDL 15 08/12/2024   LDLCALC 72 08/12/2024   LDLCALC 119 (H) 07/13/2018    Physical Findings: AIMS:  , ,  ,  ,    CIWA:    COWS:      Psychiatric Specialty Exam:  Presentation  General Appearance:  Appropriate for Environment  Eye Contact:Fleeting  Speech:Normal Rate  Speech Volume:Decreased    Mood and Affect  Mood:Anxious  Affect:Flat   Thought Process  Thought Processes:circumstantial Orientation:Partial  Thought Content:discharge focused  Hallucinations:denies  Ideas of Reference:Delusions At baseline Suicidal Thoughts:denies  Homicidal Thoughts:denies   Sensorium  Memory:Immediate Poor; Recent Poor; Remote Poor  Judgment:Impaired  Insight:None   Executive Functions  Concentration:Poor  Attention Span:Poor  Recall:Poor  Fund of Knowledge:Poor  Language:Fair   Psychomotor Activity  Psychomotor Activity:No data recorded  Musculoskeletal: Strength & Muscle Tone: within normal limits Gait & Station: normal Assets  Assets:Communication Skills; Desire for Improvement; Social Support    Physical Exam: Physical Exam Vitals and nursing note reviewed.    ROS Blood pressure (!) 136/98, pulse (!) 55, temperature 97.8 F (36.6 C), resp. rate 17, height 6' 1 (1.854 m), weight 81.2 kg, SpO2 (!) 72%. Body mass index is 23.62 kg/m.  Diagnosis: Active Problems:   Schizophrenia, paranoid (HCC)   Dementia with behavioral disturbance (HCC)   PLAN: Safety and Monitoring:  -- Involuntary admission to inpatient psychiatric unit for safety, stabilization and treatment  -- Daily contact with patient to assess and evaluate symptoms and progress in treatment  -- Patient's case to be discussed in multi-disciplinary team meeting  -- Observation Level : q15 minute checks  -- Vital signs:  q12 hours  -- Precautions: suicide, elopement, and assault -- Encouraged patient to participate in unit milieu and in scheduled group therapies  2. Psychiatric Treatment:  Scheduled Medications:  Discontinued oral Abilify  LAY Abilify  Maintena 400 mg q4 weeks given on 07/27/24, 08/24/2024  Cogentin  1mg  one dose given for EPS Depakote  ER increased to 1000 mg to  help with mood stabilization  Depakote  levels 09/02/24: 57  -- The risks/benefits/side-effects/alternatives to this medication were discussed in detail with  the patient and time was given for questions. The patient consents to medication trial.  3. Medical Issues Being Addressed:  [facility] consulted for wound care management   Assessment and Plan:   Sacral Decubitus Ulcer Full thickness decubitus ulcer in setting of recent extended hospitalization, discharged from [facility] 14th Wound looks pretty good overall WOC on board we appreciate input   Cognitive Deficit Concern for anoxic brain injury at discharge 02/2024 Delirium precautions   Peripheral Vascular Disease Legs with features c/w peripheral vascular disease, diminished DP pulses Would benefit from outpatient ABI's   HFrEF Euvolemic at this time Continue home medications   Hx Atrial Flutter Not on any anticoagulation at this time EKG with NSR Continue amiodarone   Hypertension Noted, adjust meds gradually after med rec complete   Tooth pain/Infection - Added: Augmentin  along with Orajel BP -better today no longer hypotensive-Will continue reduced dose of metoprolol  12.5 bid.   4. Discharge Planning:   -- Social work and case management to assist with discharge planning and identification of hospital follow-up needs prior to discharge  -- Estimated LOS: 3-4 days  Brittiney Dicostanzo, MD 09/08/2024, 3:43 PM

## 2024-09-08 NOTE — Progress Notes (Addendum)
(  Sleep Hours) - 3.50 (Any PRNs that were needed, meds refused, or side effects to meds)- tylenol , hydroxyzine -effective; trazodone -Not effective (Any disturbances and when (visitation, over night)-None reported or observed (Concerns raised by the patient)- None reported or observed (SI/HI/AVH)- Denies

## 2024-09-08 NOTE — Plan of Care (Signed)
" °  Problem: Education: Goal: Knowledge of [facility] Education information/materials will improve 09/08/2024 1656 by Claudene Bronwyn DASEN, RN Outcome: Progressing 09/08/2024 1656 by Claudene Bronwyn DASEN, RN Outcome: Progressing   Problem: Activity: Goal: Interest or engagement in activities will improve 09/08/2024 1656 by Claudene Bronwyn DASEN, RN Outcome: Progressing 09/08/2024 1656 by Claudene Bronwyn DASEN, RN Outcome: Progressing   Problem: Coping: Goal: Ability to verbalize frustrations and anger appropriately will improve 09/08/2024 1656 by Claudene Bronwyn DASEN, RN Outcome: Progressing 09/08/2024 1656 by Claudene Bronwyn DASEN, RN Outcome: Progressing   Problem: Education: Goal: Knowledge of [facility] Education information/materials will improve 09/08/2024 1656 by Claudene Bronwyn DASEN, RN Outcome: Progressing 09/08/2024 1656 by Claudene Bronwyn DASEN, RN Outcome: Progressing Goal: Emotional status will improve 09/08/2024 1656 by Claudene Bronwyn DASEN, RN Outcome: Progressing 09/08/2024 1656 by Claudene Bronwyn DASEN, RN Outcome: Progressing Goal: Mental status will improve 09/08/2024 1656 by Claudene Bronwyn DASEN, RN Outcome: Progressing 09/08/2024 1656 by Claudene Bronwyn DASEN, RN Outcome: Progressing Goal: Verbalization of understanding the information provided will improve 09/08/2024 1656 by Claudene Bronwyn DASEN, RN Outcome: Progressing 09/08/2024 1656 by Claudene Bronwyn DASEN, RN Outcome: Progressing   "

## 2024-09-08 NOTE — BHH Counselor (Signed)
 CSW sent FL2 to [employer]) via email per Cherene Styron's request.   Lum Croft, MSW, [facility] 09/08/2024 11:27 AM

## 2024-09-08 NOTE — Group Note (Signed)

## 2024-09-08 NOTE — Group Note (Signed)
 Recreation Therapy Group Note   Group Topic:Leisure Education  Group Date: 09/08/2024 Start Time: 1415 End Time: 1500 Facilitators: Celestia Jeoffrey BRAVO, LRT, CTRS Location: Dayroom  Group Description: General Recreation. Patients were given the opportunity to play cards, journal, or color mandalas during group. Pt identified and conversated about things they enjoy doing in their free time and how they can continue to do that outside of the hospital.  Goal Area(s) Addressed: Patient will practice making a positive decision. Patient will have the opportunity to try a new leisure activity. Patient will communicate with peers and LRT.   Affect/Mood: N/A   Participation Level: Did not attend    Clinical Observations/Individualized Feedback: Patient did not attend.  Plan: Continue to engage patient in RT group sessions 2-3x/week.   Jeoffrey BRAVO Celestia, LRT, CTRS 09/08/2024 5:00 PM

## 2024-09-08 NOTE — Group Note (Signed)
 Date:  09/08/2024 Time:  11:05 AM  Group Topic/Focus:  Orientation:   The focus of this group is to educate the patient on the purpose and policies of crisis stabilization and provide a format to answer questions about their admission.  The group details unit policies and expectations of patients while admitted. Wellness Toolbox:   The focus of this group is to discuss various aspects of wellness, balancing those aspects and exploring ways to increase the ability to experience wellness.  Patients will create a wellness toolbox for use upon discharge.    Participation Level:  Minimal  Participation Quality:  Appropriate and Attentive  Affect:  Appropriate  Cognitive:  Alert  Insight: Good  Engagement in Group:  Engaged  Modes of Intervention:  Activity and Orientation  Additional Comments:     Maglione,Koya Hunger E 09/08/2024, 11:05 AM

## 2024-09-08 NOTE — NC FL2 (Signed)
 " Miguel [name]  MEDICAID FL2 LEVEL OF CARE FORM     IDENTIFICATION  Patient Name: Miguel [name] Birthdate: 10-Apr-1951 Sex: male Admission Date (Current Location): 07/18/2024  Miguel [name] and [place] Number:  [place] [id number] [place] [facility] and Address:  [facility], [address] [place], [place] [address]      Provider Number: [id number]  Attending Physician Name and Address:  Donnelly Mellow, MD  Relative Name and Phone Number:  Miguel [name], brother, [phone]    Current Level of Care: Hospital Recommended Level of Care: [facility] Prior Approval Number:    Date Approved/Denied:   PASRR Number:    Discharge Plan: Home    Current Diagnoses: Patient Active Problem List   Diagnosis Date Noted   Schizophrenia, paranoid (HCC) 07/20/2024   Dementia with behavioral disturbance (HCC) 07/20/2024   Protein-calorie malnutrition, severe 03/03/2024   Cardiac arrest (HCC) 02/20/2024   Malnutrition of moderate degree 02/20/2024   Aspiration pneumonia of both lungs due to gastric secretions (HCC) 02/20/2024   HFrEF (heart failure with reduced ejection fraction) (HCC) 02/20/2024   LV (left ventricular) mural thrombus 02/20/2024   Elevated troponin 02/20/2024   Transaminitis 02/20/2024   Dilated cardiomyopathy (HCC) 02/20/2024   Schizoaffective disorder, bipolar type (HCC) 09/14/2022   Hypertensive emergency 09/13/2022   Bilateral pleural effusion 09/13/2022   Cocaine use disorder (HCC) 09/13/2022   Acute on chronic systolic CHF (congestive heart failure) (HCC) 09/10/2022   Acute respiratory failure with hypoxia (HCC) 09/10/2022   Stroke (HCC) 07/12/2018   Noncompliance 04/11/2016    Orientation RESPIRATION BLADDER Height & Weight     Self  Normal Incontinent Weight: 179 lb (81.2 kg) Height:  6' 1 (185.4 cm)  BEHAVIORAL SYMPTOMS/MOOD NEUROLOGICAL BOWEL NUTRITION STATUS      Incontinent  (Normal)  AMBULATORY STATUS COMMUNICATION OF NEEDS  Skin   Independent Verbally Normal, Other (Comment) (Wound noted on pt's sacrum, pt receiving wound care at this time from nursing staff)                       Personal Care Assistance Level of Assistance              Functional Limitations Info  Speech     Speech Info: Impaired (Pt mumbles)    SPECIAL CARE FACTORS FREQUENCY  Blood pressure Blood Pressure Frequency: 2x daily                  Contractures Contractures Info: Not present    Additional Factors Info  Code Status Code Status Info: FULL Allergies Info: No Known Allergies Psychotropic Info: ARIPiprazole  ER (ABILIFY  MAINTENA) injection 400 mg         Current Medications (09/08/2024):  This is the current hospital active medication list Current Facility-Administered Medications  Medication Dose Route Frequency Provider Last Rate Last Admin   acetaminophen  (TYLENOL ) tablet 650 mg  650 mg Oral Q6H PRN Hampton, Tracie B, NP   650 mg at 09/07/24 2100   alum & mag hydroxide-simeth (MAALOX/MYLANTA) 200-200-20 MG/5ML suspension 30 mL  30 mL Oral Q4H PRN Hampton, Tracie B, NP   30 mL at 08/05/24 0306   amiodarone  (PACERONE ) tablet 400 mg  400 mg Oral Daily [name], [name], MD   400 mg at 09/08/24 9047   ARIPiprazole  ER (ABILIFY  MAINTENA) injection 400 mg  400 mg Intramuscular Q28 days [name], [name], MD   400 mg at 08/24/24 0934   benzocaine  (ORAJEL) 10 % mucosal gel  Mouth/Throat TID PRN [name], [name], MD       divalproex  (DEPAKOTE  ER) 24 hr tablet 1,000 mg  1,000 mg Oral QHS [name], [name], MD   1,000 mg at 09/07/24 2100   docusate sodium  (COLACE) capsule 100 mg  100 mg Oral Daily [name], [name], MD   100 mg at 09/08/24 0953   feeding supplement (ENSURE PLUS HIGH PROTEIN) liquid 237 mL  237 mL Oral TID BM [name], [name], MD   237 mL at 09/08/24 0954   hydrOXYzine  (ATARAX ) tablet 25 mg  25 mg Oral TID PRN Hampton, Tracie B, NP   25 mg at 09/07/24 2100   magnesium  hydroxide (MILK OF MAGNESIA) suspension  30 mL  30 mL Oral Daily PRN Hampton, Tracie B, NP   30 mL at 08/08/24 2233   memantine  (NAMENDA ) tablet 10 mg  10 mg Oral Daily [name], [name], MD   10 mg at 09/08/24 0953   metoprolol  tartrate (LOPRESSOR ) tablet 12.5 mg  12.5 mg Oral BID [name], [name], MD   12.5 mg at 09/08/24 9046   naproxen  (NAPROSYN ) tablet 250 mg  250 mg Oral BID WC [name], [name], MD   250 mg at 09/08/24 0859   OLANZapine  (ZYPREXA ) injection 5 mg  5 mg Intramuscular TID PRN Hampton, Tracie B, NP       OLANZapine  zydis (ZYPREXA ) disintegrating tablet 5 mg  5 mg Oral TID PRN Hampton, Tracie B, NP   5 mg at 08/04/24 1129   oxyCODONE  (Oxy IR/ROXICODONE ) immediate release tablet 5 mg  5 mg Oral Q4H PRN [name], [name], MD   5 mg at 09/01/24 2102   pantoprazole  (PROTONIX ) EC tablet 40 mg  40 mg Oral Daily [name], [name], MD   40 mg at 09/08/24 0951   sodium chloride  (OCEAN) 0.65 % nasal spray 1 spray  1 spray Each Nare PRN Donnelly Mellow, MD   1 spray at 08/18/24 1459   traZODone  (DESYREL ) tablet 50 mg  50 mg Oral QHS PRN Hampton, Tracie B, NP   50 mg at 09/07/24 2100     Discharge Medications: Please see discharge summary for a list of discharge medications.  Relevant Imaging Results:  Relevant Lab Results:   Additional Information SSN [id number]  Miguel [name], [place]     "

## 2024-09-09 DIAGNOSIS — F200 Paranoid schizophrenia: Secondary | ICD-10-CM | POA: Diagnosis not present

## 2024-09-09 DIAGNOSIS — F03918 Unspecified dementia, unspecified severity, with other behavioral disturbance: Secondary | ICD-10-CM | POA: Diagnosis not present

## 2024-09-09 NOTE — Group Note (Signed)
 Date:  09/09/2024 Time:  10:23 AM  Group Topic/Focus:  Orientation:   The focus of this group is to educate the patient on the purpose and policies of crisis stabilization and provide a format to answer questions about their admission.  The group details unit policies and expectations of patients while admitted.  Effective geriatric exercises focus on aerobic activity, strength, balance, and flexibility, including brisk walking, swimming, chair yoga, tai chi, using resistance bands, and simple bodyweight moves (like chair squats, wall push-ups) to boost heart health, prevent falls, maintain independence, and improve overall well-being, aiming for 150 mins of moderate aerobic activity and 2+ days of strength training weekly.   Participation Level:  None  Participation Quality:  Drowsy  Affect:  None  Cognitive:  Lacking  Insight: None  Engagement in Group:  None  Modes of Intervention:  Activity and Discussion  Additional Comments:  He was asleep   Miguel [name] Gavel 09/09/2024, 10:23 AM

## 2024-09-09 NOTE — BHH Group Notes (Signed)
 Spirituality Group   Description: Participant directed exploration of values, beliefs and meaning   **Focus on Gratitude: Invite reflection on sources of gratitude (external/internal); goal to invite internal gratitude to foster 1) reconnection with life-giving activities 2) self-compassion.   Following a brief framework of chaplains role and ground rules of group behavior, participants are invited to share concerns or questions that engage spiritual life. Emphasis placed on common themes and shared experiences and ways to make meaning and clarify living into ones values.   Theory/Process/Goal: Utilize the theoretical framework of group therapy established by Celena Kite, Relational Cultural Theory and Rogerian approaches to facilitate relational empathy and use of the here and now to foster reflection, self-awareness, and sharing.   Observations: Akshath was an active participant in the group discussion. Offered kind comment to peer.  Makenleigh Crownover L. Delores HERO.Div

## 2024-09-09 NOTE — Progress Notes (Signed)
 SI/HI/AVH: denies all  Behavior/Mood: appropriate/pleasant    Interaction/Group attendance: assertive/3 of 3 groups    Medication/PRNs: compliant/none    Pain: denies  Other: wound care performed as per order

## 2024-09-09 NOTE — Progress Notes (Signed)
" °   09/08/24 2000  Psych Admission Type (Psych Patients Only)  Admission Status Involuntary  Psychosocial Assessment  Patient Complaints Confusion  Eye Contact Fair  Facial Expression Animated  Affect Appropriate to circumstance  Speech Soft  Interaction Assertive  Motor Activity Slow  Appearance/Hygiene Body odor;Poor hygiene  Behavior Characteristics Cooperative;Appropriate to situation;Calm  Mood Pleasant  Thought Process  Coherency Tangential  Content Preoccupation  Delusions WDL  Perception WDL  Hallucination None reported or observed  Judgment Poor  Confusion Mild  Danger to Self  Current suicidal ideation? Denies  Danger to Others  Danger to Others None reported or observed   Mood/Behavior:  Pleasant and cooperative. Mild confusion. Pt restless frequently up throughout the night and napping.   Psych assessment: Denies SI/HI and AVH.     Interaction / Group attendance:  Present in the milieu. Interacting with peers and staff.  Attended group.   Medication/ PRNs: Compliant with scheduled medications. Required PRNs Trazodone  for sleep and noted decrease effect.    Pain: Denies    15 min checks in place for safety. "

## 2024-09-09 NOTE — Progress Notes (Signed)
 [facility] MD Progress Note  09/09/2024 8:35 PM Miguel [name]  MRN:  [id number]  Miguel [name] is a 74 year old male with a past psychiatric history significant for schizophrenia who presents under involuntary commitment initiated by family due to medication non-adherence, increasing paranoia, and behavioral disorganization. 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 Brother reports multiple episodes of patient whispering through the night, stating people were talking about him and planning to harm him. Patient was advised by his ACT Team to come to the ED; he refused, prompting the family to pursue IVC.Patient is admitted to [facility] unit with Q15 min safety monitoring. Multidisciplinary team approach is offered. Medication management; group/milieu therapy is offered.   Subjective:  Chart reviewed, case discussed in multidisciplinary meeting, patient seen during rounds.   Patient is not in heart rate remains on the lower.  Patient is encouraged to take more fluids.  Per social work team patient's brother got the guardianship.  FL 2 has been sent for replacement.  Patient denies SI/HI but remains disorganized and continues to talk about his family members.  He is not able to acknowledge his current presentation and the need for hospitalization. Past Psychiatric History: see h&P Family History:  Family History  Family history unknown: Yes   Social History:  Social History   Substance and Sexual Activity  Alcohol Use No     Social History   Substance and Sexual Activity  Drug  Use No    Social History   Socioeconomic History   Marital status: Divorced    Spouse name: Not on file   Number of children: Not on file   Years of education: Not on file   Highest education level: Not on file  Occupational History   Not on file  Tobacco Use   Smoking status: Every Day    Current packs/day: 0.50    Average packs/day: 0.5 packs/day for 46.0 years (23.0 ttl pk-yrs)    Types: Cigarettes    Start date: 76   Smokeless tobacco: Never  Vaping Use   Vaping status: Never Used  Substance and Sexual Activity   Alcohol use: No   Drug use: No   Sexual activity: Not on file  Other Topics Concern   Not on file  Social History Narrative   Not on file   Social Drivers of Health   Tobacco Use: High Risk (07/18/2024)   Patient History    Smoking Tobacco Use: Every Day    Smokeless Tobacco Use: Never    Passive Exposure: Not on file  Financial Resource Strain: Not on file  Food Insecurity: Patient Unable To Answer (07/18/2024)   Epic    Worried About Programme Researcher, Broadcasting/film/video in the Last Year: Patient unable to answer    Ran Out of Food in the Last Year: Patient unable to answer  Transportation Needs: Unmet Transportation Needs (07/18/2024)   Epic    Lack of Transportation (Medical): Yes    Lack of Transportation (Non-Medical): Yes  Physical Activity: Not on file  Stress: Not on file  Social Connections: Patient Unable To Answer (07/18/2024)   Social Connection and Isolation Panel    Frequency of Communication with Friends and Family:  Patient unable to answer    Frequency of Social Gatherings with Friends and Family: Patient unable to answer    Attends Religious Services: Patient unable to answer    Active Member of Clubs or Organizations: Patient unable to answer    Attends Club or Organization Meetings: Patient unable to answer    Marital Status: Patient unable to answer  Depression (PHQ2-9): Not on file  Alcohol Screen: Low Risk (07/18/2024)   Alcohol Screen     Last Alcohol Screening Score (AUDIT): 0  Housing: Unknown (07/18/2024)   Epic    Unable to Pay for Housing in the Last Year: Patient unable to answer    Number of Times Moved in the Last Year: 0    Homeless in the Last Year: Patient declined  Utilities: Patient Unable To Answer (07/18/2024)   Epic    Threatened with loss of utilities: Patient unable to answer  Health Literacy: Not on file   Past Medical History:  Past Medical History:  Diagnosis Date   Biventricular failure (HCC)    CVA (cerebral vascular accident) (HCC)    Polysubstance use disorder    Schizophrenia (HCC)     Past Surgical History:  Procedure Laterality Date   APPENDECTOMY     TRACHEOSTOMY TUBE PLACEMENT N/A 03/04/2024   Procedure: CREATION, TRACHEOSTOMY;  Surgeon: [name] Massie RAMAN, MD;  Location: [facility] ORS;  Service: ENT;  Laterality: N/A;    Current Medications: Current Facility-Administered Medications  Medication Dose Route Frequency Provider Last Rate Last Admin   acetaminophen  (TYLENOL ) tablet 650 mg  650 mg Oral Q6H PRN Hampton, Tracie B, NP   650 mg at 09/07/24 2100   alum & mag hydroxide-simeth (MAALOX/MYLANTA) 200-200-20 MG/5ML suspension 30 mL  30 mL Oral Q4H PRN Hampton, Tracie B, NP   30 mL at 08/05/24 0306   amiodarone  (PACERONE ) tablet 400 mg  400 mg Oral Daily [name], [name], MD   400 mg at 09/09/24 0858   ARIPiprazole  ER (ABILIFY  MAINTENA) injection 400 mg  400 mg Intramuscular Q28 days Dmiyah Liscano, MD   400 mg at 08/24/24 0934   benzocaine  (ORAJEL) 10 % mucosal gel   Mouth/Throat TID PRN [name], [name], MD       divalproex  (DEPAKOTE  ER) 24 hr tablet 1,000 mg  1,000 mg Oral QHS Bulah Lurie, MD   1,000 mg at 09/08/24 2125   docusate sodium  (COLACE) capsule 100 mg  100 mg Oral Daily Russell Quinney, MD   100 mg at 09/09/24 0858   feeding supplement (ENSURE PLUS HIGH PROTEIN) liquid 237 mL  237 mL Oral TID BM [name], [name], MD   237 mL at 09/09/24 1407   hydrOXYzine  (ATARAX ) tablet 25 mg   25 mg Oral TID PRN Hampton, Tracie B, NP   25 mg at 09/07/24 2100   magnesium  hydroxide (MILK OF MAGNESIA) suspension 30 mL  30 mL Oral Daily PRN Hampton, Tracie B, NP   30 mL at 08/08/24 2233   memantine  (NAMENDA ) tablet 10 mg  10 mg Oral Daily Milik Gilreath, MD   10 mg at 09/09/24 9141   metoprolol  tartrate (LOPRESSOR ) tablet 12.5 mg  12.5 mg Oral BID [name], [name], MD   12.5 mg at 09/08/24 0953   naproxen  (NAPROSYN ) tablet 250 mg  250 mg Oral BID WC Lakeasha Petion, MD   250 mg at 09/09/24 1657   OLANZapine  (ZYPREXA ) injection 5 mg  5 mg Intramuscular TID PRN Hampton, Tracie B, NP  OLANZapine  zydis (ZYPREXA ) disintegrating tablet 5 mg  5 mg Oral TID PRN Hampton, Tracie B, NP   5 mg at 08/04/24 1129   oxyCODONE  (Oxy IR/ROXICODONE ) immediate release tablet 5 mg  5 mg Oral Q4H PRN Dorinda Homans T, MD   5 mg at 09/01/24 2102   pantoprazole  (PROTONIX ) EC tablet 40 mg  40 mg Oral Daily [name], [name], MD   40 mg at 09/09/24 0858   sodium chloride  (OCEAN) 0.65 % nasal spray 1 spray  1 spray Each Nare PRN Mackensie Pilson, MD   1 spray at 08/18/24 1459   traZODone  (DESYREL ) tablet 50 mg  50 mg Oral QHS PRN Hampton, Tracie B, NP   50 mg at 09/08/24 2125    Lab Results:  No results found for this or any previous visit (from the past 48 hours).     Blood Alcohol level:  Lab Results  Component Value Date   [facility] <15 07/17/2024   ETH <15 02/20/2024    Metabolic Disorder Labs: Lab Results  Component Value Date   HGBA1C 5.4 08/12/2024   MPG 108.28 08/12/2024   MPG 119.76 07/14/2018   No results found for: PROLACTIN Lab Results  Component Value Date   CHOL 139 08/12/2024   TRIG 73 08/12/2024   HDL 52 08/12/2024   CHOLHDL 2.7 08/12/2024   VLDL 15 08/12/2024   LDLCALC 72 08/12/2024   LDLCALC 119 (H) 07/13/2018    Physical Findings: AIMS:  , ,  ,  ,    CIWA:    COWS:      Psychiatric Specialty Exam:  Presentation  General Appearance: Appropriate for  Environment  Eye Contact:Fleeting  Speech:Normal Rate  Speech Volume:Decreased    Mood and Affect  Mood:Anxious  Affect:Flat   Thought Process  Thought Processes:circumstantial Orientation:Partial  Thought Content:discharge focused  Hallucinations:denies  Ideas of Reference:Delusions At baseline Suicidal Thoughts:denies  Homicidal Thoughts:denies   Sensorium  Memory:Immediate Poor; Recent Poor; Remote Poor  Judgment:Impaired  Insight:None   Executive Functions  Concentration:Poor  Attention Span:Poor  Recall:Poor  Fund of Knowledge:Poor  Language:Fair   Psychomotor Activity  Psychomotor Activity:No data recorded  Musculoskeletal: Strength & Muscle Tone: within normal limits Gait & Station: normal Assets  Assets:Communication Skills; Desire for Improvement; Social Support    Physical Exam: Physical Exam Vitals and nursing note reviewed.    ROS Blood pressure 96/86, pulse 63, temperature (!) 97.5 F (36.4 C), resp. rate 16, height 6' 1 (1.854 m), weight 81.2 kg, SpO2 97%. Body mass index is 23.62 kg/m.  Diagnosis: Active Problems:   Schizophrenia, paranoid (HCC)   Dementia with behavioral disturbance (HCC)   PLAN: Safety and Monitoring:  -- Involuntary admission to inpatient psychiatric unit for safety, stabilization and treatment  -- Daily contact with patient to assess and evaluate symptoms and progress in treatment  -- Patient's case to be discussed in multi-disciplinary team meeting  -- Observation Level : q15 minute checks  -- Vital signs:  q12 hours  -- Precautions: suicide, elopement, and assault -- Encouraged patient to participate in unit milieu and in scheduled group therapies  2. Psychiatric Treatment:  Scheduled Medications:  Discontinued oral Abilify  LAY Abilify  Maintena 400 mg q4 weeks given on 07/27/24, 08/24/2024  Cogentin  1mg  one dose given for EPS Depakote  ER increased to 1000 mg to help with mood stabilization   Depakote  levels 09/02/24: 57  -- The risks/benefits/side-effects/alternatives to this medication were discussed in detail with the patient and time was given for questions. The  patient consents to medication trial.  3. Medical Issues Being Addressed:  [facility] consulted for wound care management   Assessment and Plan:   Sacral Decubitus Ulcer Full thickness decubitus ulcer in setting of recent extended hospitalization, discharged from [facility] Nov 14th Wound looks pretty good overall WOC on board we appreciate input   Cognitive Deficit Concern for anoxic brain injury at discharge 02/2024 Delirium precautions   Peripheral Vascular Disease Legs with features c/w peripheral vascular disease, diminished DP pulses Would benefit from outpatient ABI's   HFrEF Euvolemic at this time Continue home medications   Hx Atrial Flutter Not on any anticoagulation at this time EKG with NSR Continue amiodarone   Hypertension Noted, adjust meds gradually after med rec complete   Tooth pain/Infection - Added: Augmentin  along with Orajel BP -better today no longer hypotensive-Will continue reduced dose of metoprolol  12.5 bid.   4. Discharge Planning:   -- Social work and case management to assist with discharge planning and identification of hospital follow-up needs prior to discharge  -- Estimated LOS: 3-4 days  Allyn Foil, MD 09/09/2024, 8:35 PM

## 2024-09-09 NOTE — Group Note (Signed)
 Date:  09/09/2024 Time:  4:07 PM  Group Topic/Focus:  Goals Group:   The focus of this group is to help patients establish daily goals to achieve during treatment and discuss how the patient can incorporate goal setting into their daily lives to aide in recovery.    Participation Level:  Active  Participation Quality:  Appropriate  Affect:  Appropriate  Cognitive:  Appropriate  Insight: Appropriate  Engagement in Group:  Engaged  Modes of Intervention:  Discussion   Arland Nutting 09/09/2024, 4:07 PM

## 2024-09-09 NOTE — BHH Counselor (Signed)
 CSW received note from nursing staff that pt's brother, Cherene wanted CSW to contact him.   CSW contacted [facility], [phone].   CSW confirmed with Cherene that CSW sent FL2.   Cherene asked for CSW's information to give to placement to schedule assessment.   CSW provided information.   CSW awaiting call from [facility] (placement) to do assessment with pt.    Lum Croft, MSW, [place] 09/09/2024 9:56 AM

## 2024-09-09 NOTE — Plan of Care (Signed)
  Problem: Education: Goal: Knowledge of [place] [facility] Education information/materials will improve Outcome: Progressing   Problem: Activity: Goal: Interest or engagement in activities will improve Outcome: Progressing   Problem: Coping: Goal: Ability to verbalize frustrations and anger appropriately will improve Outcome: Progressing   Problem: Education: Goal: Knowledge of  General Education information/materials will improve Outcome: Progressing Goal: Emotional status will improve Outcome: Progressing Goal: Mental status will improve Outcome: Progressing Goal: Verbalization of understanding the information provided will improve Outcome: Progressing   Problem: Activity: Goal: Interest or engagement in activities will improve Outcome: Progressing Goal: Sleeping patterns will improve Outcome: Progressing   Problem: Coping: Goal: Ability to verbalize frustrations and anger appropriately will improve Outcome: Progressing Goal: Ability to demonstrate self-control will improve Outcome: Progressing   Problem: Health Behavior/Discharge Planning: Goal: Identification of resources available to assist in meeting health care needs will improve Outcome: Progressing Goal: Compliance with treatment plan for underlying cause of condition will improve Outcome: Progressing   Problem: Physical Regulation: Goal: Ability to maintain clinical measurements within normal limits will improve Outcome: Progressing   Problem: Safety: Goal: Periods of time without injury will increase Outcome: Progressing   Problem: Coping: Goal: Ability to adjust to condition or change in health will improve Outcome: Progressing   Problem: Health Behavior/Discharge Planning: Goal: Ability to identify and utilize available resources and services will improve Outcome: Progressing

## 2024-09-09 NOTE — Plan of Care (Signed)
  Problem: Health Behavior/Discharge Planning: Goal: Compliance with treatment plan for underlying cause of condition will improve Outcome: Progressing   Problem: Health Behavior/Discharge Planning: Goal: Identification of resources available to assist in meeting health care needs will improve Outcome: Not Progressing

## 2024-09-09 NOTE — Group Note (Signed)
 Date:  09/09/2024 Time:  9:47 PM  Group Topic/Focus:  Wrap-Up Group:   The focus of this group is to help patients review their daily goal of treatment and discuss progress on daily workbooks.    Participation Level:  Active  Participation Quality:  Appropriate  Affect:  Appropriate  Cognitive:  Alert  Insight: Appropriate  Engagement in Group:  Engaged  Modes of Intervention:  Discussion  Additional Comments:    Dennison Mcdaid C Any Mcneice 09/09/2024, 9:47 PM

## 2024-09-10 DIAGNOSIS — F03918 Unspecified dementia, unspecified severity, with other behavioral disturbance: Secondary | ICD-10-CM | POA: Diagnosis not present

## 2024-09-10 DIAGNOSIS — F200 Paranoid schizophrenia: Secondary | ICD-10-CM | POA: Diagnosis not present

## 2024-09-10 NOTE — Plan of Care (Signed)
  Problem: Education: Goal: Knowledge of [place] [facility] Education information/materials will improve Outcome: Progressing   Problem: Activity: Goal: Interest or engagement in activities will improve Outcome: Progressing   Problem: Coping: Goal: Ability to verbalize frustrations and anger appropriately will improve Outcome: Progressing   Problem: Education: Goal: Knowledge of  General Education information/materials will improve Outcome: Progressing Goal: Emotional status will improve Outcome: Progressing Goal: Mental status will improve Outcome: Progressing Goal: Verbalization of understanding the information provided will improve Outcome: Progressing   Problem: Activity: Goal: Interest or engagement in activities will improve Outcome: Progressing Goal: Sleeping patterns will improve Outcome: Progressing   Problem: Coping: Goal: Ability to verbalize frustrations and anger appropriately will improve Outcome: Progressing Goal: Ability to demonstrate self-control will improve Outcome: Progressing   Problem: Health Behavior/Discharge Planning: Goal: Identification of resources available to assist in meeting health care needs will improve Outcome: Progressing Goal: Compliance with treatment plan for underlying cause of condition will improve Outcome: Progressing   Problem: Physical Regulation: Goal: Ability to maintain clinical measurements within normal limits will improve Outcome: Progressing   Problem: Safety: Goal: Periods of time without injury will increase Outcome: Progressing   Problem: Coping: Goal: Ability to adjust to condition or change in health will improve Outcome: Progressing   Problem: Health Behavior/Discharge Planning: Goal: Ability to identify and utilize available resources and services will improve Outcome: Progressing

## 2024-09-10 NOTE — Progress Notes (Signed)
 Patient pleasant during interaction. Accepted medications without issue. Wound care completed. Patient denies SI/HI/AVH. Patient pleasant and interactive during shift. Present in dayroom conversing with peers at group and meals. Ongoing monitoring continues.   09/10/24 1100  Psych Admission Type (Psych Patients Only)  Admission Status Involuntary  Psychosocial Assessment  Patient Complaints Confusion  Eye Contact Fair  Facial Expression Animated  Affect Appropriate to circumstance  Speech Tangential  Interaction Assertive  Motor Activity Slow  Appearance/Hygiene Layered clothes;In scrubs  Behavior Characteristics Calm  Mood Pleasant  Thought Process  Coherency Tangential  Content Preoccupation  Delusions None reported or observed  Perception WDL  Hallucination None reported or observed  Judgment Impaired  Confusion Mild  Danger to Self  Current suicidal ideation? Denies  Danger to Others  Danger to Others None reported or observed

## 2024-09-10 NOTE — Progress Notes (Signed)
" °   09/10/24 0410  Psych Admission Type (Psych Patients Only)  Admission Status Involuntary  Psychosocial Assessment  Patient Complaints Confusion  Eye Contact Fair  Facial Expression Animated  Affect Appropriate to circumstance  Speech Incoherent  Interaction Assertive  Motor Activity Slow  Appearance/Hygiene Bizarre;Layered clothes  Behavior Characteristics Appropriate to situation  Mood Pleasant  Aggressive Behavior  Effect No apparent injury  Thought Process  Coherency Tangential  Content Preoccupation  Delusions WDL  Perception WDL  Hallucination None reported or observed  Judgment Impaired  Confusion Mild  Danger to Self  Current suicidal ideation? Denies  Danger to Others  Danger to Others None reported or observed    "

## 2024-09-10 NOTE — Group Note (Signed)
 Recreation Therapy Group Note   Group Topic:General Recreation  Group Date: 09/10/2024 Start Time: 1400 End Time: 1450 Facilitators: Celestia Jeoffrey BRAVO, LRT, CTRS Location: Dayroom  Group Description: Bingo. Patients played multiple rounds of bingo. LRT and pts discussed the definition of leisure, things they do in their free time outside of the hospital, and how bingo is also a leisure activity. Pts received a coloring book, word search book, or journal as a prize.    Goal Area(s) Addressed:  Patient will identify a current leisure interest.  Patient will learn the definition of leisure. Patient will have the opportunity to try a new leisure activity. Patient will communicate with peers and LRT.   Affect/Mood: Appropriate   Participation Level: Active and Engaged   Participation Quality: Independent   Behavior: Calm and Cooperative   Speech/Thought Process: Coherent   Insight: Good   Judgement: Good   Modes of Intervention: Competitive Play and Music   Patient Response to Interventions:  Attentive, Engaged, Interested , and Receptive   Education Outcome:  Acknowledges education   Clinical Observations/Individualized Feedback: Gaurav was active in their participation of session activities and group discussion. Pt won a facilities manager and received chapstick as a scientist, research (physical sciences).   Plan: Continue to engage patient in RT group sessions 2-3x/week.   Jeoffrey BRAVO Celestia, LRT, CTRS 09/10/2024 4:46 PM

## 2024-09-10 NOTE — Group Note (Signed)
 Date:  09/10/2024 Time:  8:44 PM  Group Topic/Focus:  Identifying Needs:   The focus of this group is to help patients identify their personal needs that have been historically problematic and identify healthy behaviors to address their needs.    Participation Level:  Active  Participation Quality:  Appropriate  Affect:  Appropriate  Cognitive:  Appropriate  Insight: Good  Engagement in Group:  Engaged  Modes of Intervention:  Discussion  Additional Comments:    Miguel [name] 09/10/2024, 8:44 PM

## 2024-09-10 NOTE — Plan of Care (Signed)
" °  Problem: Education: Goal: Knowledge of [place] [facility] Education information/materials will improve Outcome: Not Progressing   Problem: Activity: Goal: Interest or engagement in activities will improve Outcome: Not Progressing   Problem: Coping: Goal: Ability to verbalize frustrations and anger appropriately will improve Outcome: Not Progressing   Problem: Education: Goal: Knowledge of [place] [facility] Education information/materials will improve Outcome: Not Progressing Goal: Emotional status will improve Outcome: Not Progressing Goal: Mental status will improve Outcome: Not Progressing   "

## 2024-09-10 NOTE — BH IP Treatment Plan (Signed)
 Interdisciplinary Treatment and Diagnostic Plan Update  09/10/2024 Time of Session: 3:00PM Miguel [name] MRN: [id number]  Principal Diagnosis: <principal problem not specified>  Secondary Diagnoses: Active Problems:   Schizophrenia, paranoid (HCC)   Dementia with behavioral disturbance (HCC)   Current Medications:  Current Facility-Administered Medications  Medication Dose Route Frequency Provider Last Rate Last Admin   acetaminophen  (TYLENOL ) tablet 650 mg  650 mg Oral Q6H PRN Hampton, Tracie B, NP   650 mg at 09/07/24 2100   alum & mag hydroxide-simeth (MAALOX/MYLANTA) 200-200-20 MG/5ML suspension 30 mL  30 mL Oral Q4H PRN Hampton, Tracie B, NP   30 mL at 08/05/24 0306   amiodarone  (PACERONE ) tablet 400 mg  400 mg Oral Daily [name], [name], MD   400 mg at 09/10/24 1015   ARIPiprazole  ER (ABILIFY  MAINTENA) injection 400 mg  400 mg Intramuscular Q28 days [name], [name], MD   400 mg at 08/24/24 9065   benzocaine  (ORAJEL) 10 % mucosal gel   Mouth/Throat TID PRN [name], [name], MD       divalproex  (DEPAKOTE  ER) 24 hr tablet 1,000 mg  1,000 mg Oral QHS [name], [name], MD   1,000 mg at 09/09/24 2159   docusate sodium  (COLACE) capsule 100 mg  100 mg Oral Daily [name], [name], MD   100 mg at 09/10/24 1015   feeding supplement (ENSURE PLUS HIGH PROTEIN) liquid 237 mL  237 mL Oral TID BM [name], [name], MD   237 mL at 09/10/24 1406   hydrOXYzine  (ATARAX ) tablet 25 mg  25 mg Oral TID PRN Hampton, Tracie B, NP   25 mg at 09/07/24 2100   magnesium  hydroxide (MILK OF MAGNESIA) suspension 30 mL  30 mL Oral Daily PRN Hampton, Tracie B, NP   30 mL at 08/08/24 2233   memantine  (NAMENDA ) tablet 10 mg  10 mg Oral Daily [name], [name], MD   10 mg at 09/10/24 1015   metoprolol  tartrate (LOPRESSOR ) tablet 12.5 mg  12.5 mg Oral BID [name], [name], MD   12.5 mg at 09/10/24 1015   naproxen  (NAPROSYN ) tablet 250 mg  250 mg Oral BID WC [name], [name], MD   250 mg at 09/10/24 0801   OLANZapine   (ZYPREXA ) injection 5 mg  5 mg Intramuscular TID PRN Hampton, Tracie B, NP       OLANZapine  zydis (ZYPREXA ) disintegrating tablet 5 mg  5 mg Oral TID PRN Hampton, Tracie B, NP   5 mg at 08/04/24 1129   oxyCODONE  (Oxy IR/ROXICODONE ) immediate release tablet 5 mg  5 mg Oral Q4H PRN Dorinda Homans T, MD   5 mg at 09/01/24 2102   pantoprazole  (PROTONIX ) EC tablet 40 mg  40 mg Oral Daily [name], [name], MD   40 mg at 09/10/24 1015   sodium chloride  (OCEAN) 0.65 % nasal spray 1 spray  1 spray Each Nare PRN [name], [name], MD   1 spray at 08/18/24 1459   traZODone  (DESYREL ) tablet 50 mg  50 mg Oral QHS PRN Hampton, Tracie B, NP   50 mg at 09/09/24 2159   PTA Medications: Medications Prior to Admission  Medication Sig Dispense Refill Last Dose/Taking   amiodarone  (PACERONE ) 400 MG tablet Place 1 tablet (400 mg total) into feeding tube daily.      ARIPiprazole  (ABILIFY ) 30 MG tablet Place 1 tablet (30 mg total) into feeding tube at bedtime.      bisacodyl  (DULCOLAX) 10 MG suppository Place 1 suppository (10 mg total) rectally daily as needed for  moderate constipation or severe constipation.      busPIRone  (BUSPAR ) 5 MG tablet Take 5 mg by mouth 3 (three) times daily.      Chlorhexidine  Gluconate Cloth 2 % PADS Apply 6 each topically daily.      digoxin  (LANOXIN ) 0.125 MG tablet 1 tablet (0.125 mg total) by Per NG tube route daily. (Patient taking differently: Take 0.125 mg by mouth every other day.)      docusate (COLACE) 50 MG/5ML liquid Place 10 mLs (100 mg total) into feeding tube 2 (two) times daily.      metoprolol  tartrate (LOPRESSOR ) 25 MG tablet Take 25 mg by mouth 2 (two) times daily.      pantoprazole  (PROTONIX ) 40 MG tablet Take 40 mg by mouth daily.      traMADol  (ULTRAM ) 50 MG tablet Take 50 mg by mouth 2 (two) times daily.       Patient Stressors: Educational concerns   Health problems   Medication change or noncompliance   Other: Stated transitional housing concerns    Patient  Strengths: Manufacturing systems engineer  Religious Affiliation   Treatment Modalities: Medication Management, Group therapy, Case management,  1 to 1 session with clinician, Psychoeducation, Recreational therapy.   Physician Treatment Plan for Primary Diagnosis: <principal problem not specified> Long Term Goal(s):     Short Term Goals:    Medication Management: Evaluate patient's response, side effects, and tolerance of medication regimen.  Therapeutic Interventions: 1 to 1 sessions, Unit Group sessions and Medication administration.  Evaluation of Outcomes: Progressing  Physician Treatment Plan for Secondary Diagnosis: Active Problems:   Schizophrenia, paranoid (HCC)   Dementia with behavioral disturbance (HCC)  Long Term Goal(s):     Short Term Goals:       Medication Management: Evaluate patient's response, side effects, and tolerance of medication regimen.  Therapeutic Interventions: 1 to 1 sessions, Unit Group sessions and Medication administration.  Evaluation of Outcomes: Progressing   RN Treatment Plan for Primary Diagnosis: <principal problem not specified> Long Term Goal(s): Knowledge of disease and therapeutic regimen to maintain health will improve  Short Term Goals: Ability to verbalize frustration and anger appropriately will improve, Ability to demonstrate self-control, Ability to participate in decision making will improve, Ability to verbalize feelings will improve, Ability to disclose and discuss suicidal ideas, Ability to identify and develop effective coping behaviors will improve, and Compliance with prescribed medications will improve  Medication Management: RN will administer medications as ordered by provider, will assess and evaluate patient's response and provide education to patient for prescribed medication. RN will report any adverse and/or side effects to prescribing provider.  Therapeutic Interventions: 1 on 1 counseling sessions, Psychoeducation,  Medication administration, Evaluate responses to treatment, Monitor vital signs and CBGs as ordered, Perform/monitor CIWA, COWS, AIMS and Fall Risk screenings as ordered, Perform wound care treatments as ordered.  Evaluation of Outcomes: Progressing   LCSW Treatment Plan for Primary Diagnosis: <principal problem not specified> Long Term Goal(s): Safe transition to appropriate next level of care at discharge, Engage patient in therapeutic group addressing interpersonal concerns.  Short Term Goals: Engage patient in aftercare planning with referrals and resources, Increase social support, Increase ability to appropriately verbalize feelings, Increase emotional regulation, Facilitate acceptance of mental health diagnosis and concerns, and Increase skills for wellness and recovery  Therapeutic Interventions: Assess for all discharge needs, 1 to 1 time with Social worker, Explore available resources and support systems, Assess for adequacy in community support network, Educate family and significant other(s) on suicide  prevention, Complete Psychosocial Assessment, Interpersonal group therapy.  Evaluation of Outcomes: Progressing   Progress in Treatment: Attending groups: No. Participating in groups: No. Taking medication as prescribed: Yes. Toleration medication: Yes. Family/Significant other contact made: Yes, individual(s) contacted:  SPE is completed with the patient's brother.  Patient understands diagnosis: No. Discussing patient identified problems/goals with staff: Yes. Medical problems stabilized or resolved: Yes. Denies suicidal/homicidal ideation: Yes. Issues/concerns per patient self-inventory: No. Other: none  New problem(s) identified: No, Describe:  None Update 07/26/24: No changes at this time Update 08/01/24: No changes at this time  Update 08/06/24: No changes at this time 08/11/24 Update: No changes at this time.  Update 08/16/24: No changes at this time. Update 12/262025:   No changes at this time. Update 08/26/2024:  No changes at this time. Update 08/31/2024:  No changes at this time. Update: 09/05/2024  Update 09/10/2024:  No changes at this time.    New Short Term/Long Term Goal(s):detox, elimination of symptoms of psychosis, medication management for mood stabilization; elimination of SI thoughts; development of comprehensive mental wellness/sobriety plan.  Update 07/26/24: No changes at this time Update 08/01/24: No changes at this time Update 08/06/24: No changes at this time  10/13/23 Update: No changes at this time. Update 08/16/24: No changes at this time.  Update 12/262025:  No changes at this time. Update 08/26/2024:  No changes at this time. Update 08/31/2024:  No changes at this time. Update:09/05/2024 no changes  Update 09/10/2024:  No changes at this time.    Patient Goals:  detox, elimination of symptoms of psychosis, medication management for mood stabilization; elimination of SI thoughts; development of comprehensive mental wellness/sobriety plan.  Update 07/26/24: No changes at this time Update 08/01/24: No changes at this time Update 08/06/24: No changes at this time  10/13/23 Update: No changes at this time. Update 08/16/24: No changes at this time.  Update 12/262025:  No changes at this time. Update 08/26/2024:  No changes at this time. Update 08/31/2024:  No changes at this time. 09/05/2024: no changes Update 09/10/2024:  No changes at this time.    Discharge Plan or Barriers: CSW to assist with the development of appropriate discharge plan.  Update 07/26/24: No changes at this time Update 08/01/24: APS Report made for pt, screened it. Update 08/06/24: FL2 sent to [facility]. Pt is not placement. [facility] and APS to assist with placement. 08/11/24 Update: CSW to continue to support [facility] in working toward placement for patient. Update 08/16/24: No changes at this time. Update 12/262025:  No changes at this time. Update 08/26/2024:  No changes at  this time. Update 08/31/2024:  Pt awarded interim guardianship at this time, guardianship court date scheduled for 09/07/2024.  [facility] report no updates on placement at this time. 09/05/2024 no changes  Update 09/10/2024:  No changes at this time.     Reason for Continuation of Hospitalization: Aggression Anxiety Delusions  Depression Hallucinations   Estimated Length of Stay:TBD  Update 09/10/2024:  TBD  Last 3 [place] Suicide Severity Risk Score: Flowsheet Row Admission (Current) from 07/18/2024 in [facility] ED from 07/17/2024 in [facility] at [facility] ED to Hosp-Admission (Discharged) from 02/20/2024 in [facility] ICU/CCU  C-SSRS RISK CATEGORY No Risk No Risk No Risk    Last PHQ 2/9 Scores:     No data to display          Scribe for Treatment Team: Sherryle JINNY Margo, LCSW  09/10/2024 4:24 PM

## 2024-09-10 NOTE — Group Note (Signed)
 Date:  09/10/2024 Time:  10:25 AM  Group Topic/Focus:  Orientation:   The focus of this group is to educate the patient on the purpose and policies of crisis stabilization and provide a format to answer questions about their admission.  The group details unit policies and expectations of patients while admitted.  Exercise for geriatric patients offers vast benefits, significantly improving physical function, mental health, and independence by reducing fall risk, managing chronic diseases (heart disease, diabetes, cancer), enhancing cognitive function, boosting mood, improving sleep, strengthening bones, and supporting longer, healthier lives. Regular activity helps combat age-related decline, preventing frailty and enabling older adults to live more independently and with better overall quality of life.   Participation Level:  Active  Participation Quality:  Appropriate  Affect:  Appropriate  Cognitive:  Appropriate  Insight: Appropriate  Engagement in Group:  Engaged  Modes of Intervention:  Activity, Discussion, and Education  Additional Comments:  N/A  Miguel [name] 09/10/2024, 10:25 AM

## 2024-09-10 NOTE — Progress Notes (Signed)
 [facility] MD Progress Note  09/10/2024 12:16 PM Miguel [name]  MRN:  [id number]  Miguel [name] is a 74 year old male with a past psychiatric history significant for schizophrenia who presents under involuntary commitment initiated by family due to medication non-adherence, increasing paranoia, and behavioral disorganization. 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 Brother reports multiple episodes of patient whispering through the night, stating people were talking about him and planning to harm him. Patient was advised by his ACT Team to come to the ED; he refused, prompting the family to pursue IVC.Patient is admitted to [facility] unit with Q15 min safety monitoring. Multidisciplinary team approach is offered. Medication management; group/milieu therapy is offered.   Subjective:  Chart reviewed, case discussed in multidisciplinary meeting, patient seen during rounds.  Patient is noted to be sitting in his room.  He offers no complaints.  He remains confused and talks about his family.  He denies any depression or anxiety.  He denies SI/HI/plan and denies hallucinations per nursing patient is taking his medications with no reported problems.  Per social work team brother had identified a placement and social work team has sent out FL 2  Past Psychiatric History: see h&P Family History:  Family History  Family history unknown: Yes   Social History:  Social History   Substance and Sexual Activity  Alcohol Use No     Social History   Substance and Sexual Activity  Drug Use  No    Social History   Socioeconomic History   Marital status: Divorced    Spouse name: Not on file   Number of children: Not on file   Years of education: Not on file   Highest education level: Not on file  Occupational History   Not on file  Tobacco Use   Smoking status: Every Day    Current packs/day: 0.50    Average packs/day: 0.5 packs/day for 46.0 years (23.0 ttl pk-yrs)    Types: Cigarettes    Start date: 20   Smokeless tobacco: Never  Vaping Use   Vaping status: Never Used  Substance and Sexual Activity   Alcohol use: No   Drug use: No   Sexual activity: Not on file  Other Topics Concern   Not on file  Social History Narrative   Not on file   Social Drivers of Health   Tobacco Use: High Risk (07/18/2024)   Patient History    Smoking Tobacco Use: Every Day    Smokeless Tobacco Use: Never    Passive Exposure: Not on file  Financial Resource Strain: Not on file  Food Insecurity: Patient Unable To Answer (07/18/2024)   Epic    Worried About Programme Researcher, Broadcasting/film/video in the Last Year: Patient unable to answer    Ran Out of Food in the Last Year: Patient unable to answer  Transportation Needs: Unmet Transportation Needs (07/18/2024)   Epic    Lack of Transportation (Medical): Yes    Lack of Transportation (Non-Medical): Yes  Physical Activity: Not on file  Stress: Not on file  Social Connections: Patient Unable To Answer (07/18/2024)   Social Connection and Isolation Panel    Frequency of Communication with Friends and  Family: Patient unable to answer    Frequency of Social Gatherings with Friends and Family: Patient unable to answer    Attends Religious Services: Patient unable to answer    Active Member of Clubs or Organizations: Patient unable to answer    Attends Banker Meetings: Patient unable to answer    Marital Status: Patient unable to answer  Depression (PHQ2-9): Not on file  Alcohol Screen: Low Risk (07/18/2024)   Alcohol Screen     Last Alcohol Screening Score (AUDIT): 0  Housing: Unknown (07/18/2024)   Epic    Unable to Pay for Housing in the Last Year: Patient unable to answer    Number of Times Moved in the Last Year: 0    Homeless in the Last Year: Patient declined  Utilities: Patient Unable To Answer (07/18/2024)   Epic    Threatened with loss of utilities: Patient unable to answer  Health Literacy: Not on file   Past Medical History:  Past Medical History:  Diagnosis Date   Biventricular failure (HCC)    CVA (cerebral vascular accident) (HCC)    Polysubstance use disorder    Schizophrenia (HCC)     Past Surgical History:  Procedure Laterality Date   APPENDECTOMY     TRACHEOSTOMY TUBE PLACEMENT N/A 03/04/2024   Procedure: CREATION, TRACHEOSTOMY;  Surgeon: [name] Massie RAMAN, MD;  Location: [facility] ORS;  Service: ENT;  Laterality: N/A;    Current Medications: Current Facility-Administered Medications  Medication Dose Route Frequency Provider Last Rate Last Admin   acetaminophen  (TYLENOL ) tablet 650 mg  650 mg Oral Q6H PRN Hampton, Tracie B, NP   650 mg at 09/07/24 2100   alum & mag hydroxide-simeth (MAALOX/MYLANTA) 200-200-20 MG/5ML suspension 30 mL  30 mL Oral Q4H PRN Hampton, Tracie B, NP   30 mL at 08/05/24 0306   amiodarone  (PACERONE ) tablet 400 mg  400 mg Oral Daily [name], [name], MD   400 mg at 09/10/24 1015   ARIPiprazole  ER (ABILIFY  MAINTENA) injection 400 mg  400 mg Intramuscular Q28 days Casten Floren, MD   400 mg at 08/24/24 0934   benzocaine  (ORAJEL) 10 % mucosal gel   Mouth/Throat TID PRN [name], [name], MD       divalproex  (DEPAKOTE  ER) 24 hr tablet 1,000 mg  1,000 mg Oral QHS Arianny Pun, MD   1,000 mg at 09/09/24 2159   docusate sodium  (COLACE) capsule 100 mg  100 mg Oral Daily Janne Faulk, MD   100 mg at 09/10/24 1015   feeding supplement (ENSURE PLUS HIGH PROTEIN) liquid 237 mL  237 mL Oral TID BM [name], [name], MD   237 mL at 09/10/24 1016   hydrOXYzine  (ATARAX ) tablet 25 mg   25 mg Oral TID PRN Hampton, Tracie B, NP   25 mg at 09/07/24 2100   magnesium  hydroxide (MILK OF MAGNESIA) suspension 30 mL  30 mL Oral Daily PRN Hampton, Tracie B, NP   30 mL at 08/08/24 2233   memantine  (NAMENDA ) tablet 10 mg  10 mg Oral Daily Fenix Rorke, MD   10 mg at 09/10/24 1015   metoprolol  tartrate (LOPRESSOR ) tablet 12.5 mg  12.5 mg Oral BID [name], [name], MD   12.5 mg at 09/10/24 1015   naproxen  (NAPROSYN ) tablet 250 mg  250 mg Oral BID WC Meral Geissinger, MD   250 mg at 09/10/24 0801   OLANZapine  (ZYPREXA ) injection 5 mg  5 mg Intramuscular TID PRN Hampton, Tracie B, NP  OLANZapine  zydis (ZYPREXA ) disintegrating tablet 5 mg  5 mg Oral TID PRN Hampton, Tracie B, NP   5 mg at 08/04/24 1129   oxyCODONE  (Oxy IR/ROXICODONE ) immediate release tablet 5 mg  5 mg Oral Q4H PRN Dorinda Homans T, MD   5 mg at 09/01/24 2102   pantoprazole  (PROTONIX ) EC tablet 40 mg  40 mg Oral Daily [name], [name], MD   40 mg at 09/10/24 1015   sodium chloride  (OCEAN) 0.65 % nasal spray 1 spray  1 spray Each Nare PRN Donnelly Mellow, MD   1 spray at 08/18/24 1459   traZODone  (DESYREL ) tablet 50 mg  50 mg Oral QHS PRN Hampton, Tracie B, NP   50 mg at 09/09/24 2159    Lab Results:  No results found for this or any previous visit (from the past 48 hours).     Blood Alcohol level:  Lab Results  Component Value Date   [facility] <15 07/17/2024   ETH <15 02/20/2024    Metabolic Disorder Labs: Lab Results  Component Value Date   HGBA1C 5.4 08/12/2024   MPG 108.28 08/12/2024   MPG 119.76 07/14/2018   No results found for: PROLACTIN Lab Results  Component Value Date   CHOL 139 08/12/2024   TRIG 73 08/12/2024   HDL 52 08/12/2024   CHOLHDL 2.7 08/12/2024   VLDL 15 08/12/2024   LDLCALC 72 08/12/2024   LDLCALC 119 (H) 07/13/2018    Physical Findings: AIMS:  , ,  ,  ,    CIWA:    COWS:      Psychiatric Specialty Exam:  Presentation  General Appearance: Appropriate for  Environment  Eye Contact:Fleeting  Speech:Normal Rate  Speech Volume:Decreased    Mood and Affect  Mood:Anxious  Affect:Flat   Thought Process  Thought Processes:circumstantial Orientation:Partial  Thought Content:discharge focused  Hallucinations:denies  Ideas of Reference:Delusions At baseline Suicidal Thoughts:denies  Homicidal Thoughts:denies   Sensorium  Memory:Immediate Poor; Recent Poor; Remote Poor  Judgment:Impaired  Insight:None   Executive Functions  Concentration:Poor  Attention Span:Poor  Recall:Poor  Fund of Knowledge:Poor  Language:Fair   Psychomotor Activity  Psychomotor Activity:No data recorded  Musculoskeletal: Strength & Muscle Tone: within normal limits Gait & Station: normal Assets  Assets:Communication Skills; Desire for Improvement; Social Support    Physical Exam: Physical Exam Vitals and nursing note reviewed.    ROS Blood pressure (!) 134/51, pulse 81, temperature (!) 97.5 F (36.4 C), resp. rate 16, height 6' 1 (1.854 m), weight 81.2 kg, SpO2 95%. Body mass index is 23.62 kg/m.  Diagnosis: Active Problems:   Schizophrenia, paranoid (HCC)   Dementia with behavioral disturbance (HCC)   PLAN: Safety and Monitoring:  -- Involuntary admission to inpatient psychiatric unit for safety, stabilization and treatment  -- Daily contact with patient to assess and evaluate symptoms and progress in treatment  -- Patient's case to be discussed in multi-disciplinary team meeting  -- Observation Level : q15 minute checks  -- Vital signs:  q12 hours  -- Precautions: suicide, elopement, and assault -- Encouraged patient to participate in unit milieu and in scheduled group therapies  2. Psychiatric Treatment:  Scheduled Medications:  Discontinued oral Abilify  LAY Abilify  Maintena 400 mg q4 weeks given on 07/27/24, 08/24/2024  Cogentin  1mg  one dose given for EPS Depakote  ER increased to 1000 mg to help with mood  stabilization  Depakote  levels 09/02/24: 57  -- The risks/benefits/side-effects/alternatives to this medication were discussed in detail with the patient and time was given for questions.  The patient consents to medication trial.  3. Medical Issues Being Addressed:  [facility] consulted for wound care management   Assessment and Plan:   Sacral Decubitus Ulcer Full thickness decubitus ulcer in setting of recent extended hospitalization, discharged from [facility] 14th Wound looks pretty good overall WOC on board we appreciate input   Cognitive Deficit Concern for anoxic brain injury at discharge 02/2024 Delirium precautions   Peripheral Vascular Disease Legs with features c/w peripheral vascular disease, diminished DP pulses Would benefit from outpatient ABI's   HFrEF Euvolemic at this time Continue home medications   Hx Atrial Flutter Not on any anticoagulation at this time EKG with NSR Continue amiodarone   Hypertension Noted, adjust meds gradually after med rec complete   Tooth pain/Infection - Added: Augmentin  along with Orajel BP -better today no longer hypotensive-Will continue reduced dose of metoprolol  12.5 bid.   4. Discharge Planning:   -- Social work and case management to assist with discharge planning and identification of hospital follow-up needs prior to discharge  -- Estimated LOS: 3-4 days  Allyn Foil, MD 09/10/2024, 12:16 PM

## 2024-09-11 DIAGNOSIS — F200 Paranoid schizophrenia: Secondary | ICD-10-CM | POA: Diagnosis not present

## 2024-09-11 DIAGNOSIS — F03918 Unspecified dementia, unspecified severity, with other behavioral disturbance: Secondary | ICD-10-CM | POA: Diagnosis not present

## 2024-09-11 NOTE — Group Note (Signed)
 Recreation Therapy Group Note   Group Topic:Emotion Expression  Group Date: 09/11/2024 Start Time: 1400 End Time: 1445 Facilitators: Celestia Jeoffrey BRAVO, LRT, CTRS Location: Dayroom  Group Description: Painting a Peaceful Place. Patients and LRT discuss what it means to be at peace, what it feels like physically and mentally. Pts are given a canvas and watercolor paint to use and encouraged to draw their idea of a peaceful place. Pts and LRT discuss how they use this in their daily life post discharge. Pts are encouraged to take their canvas home with them as a reminder to find their peaceful place whenever they are feeling depressed, anxious, etc.    Goal Area(s) Addressed:  Patient will identify what it means to experience a peaceful emotion. Patient will identify a new coping skill.  Patient will express their emotions through art. Patients will increase communication by talking with LRT and peers while in group.   Affect/Mood: Appropriate   Participation Level: Active   Participation Quality: Independent   Behavior: Cooperative   Speech/Thought Process: Disorganized   Insight: Limited   Judgement: Limited   Modes of Intervention: Art   Patient Response to Interventions:  Receptive   Education Outcome:  In group clarification offered    Clinical Observations/Individualized Feedback: Miachel was mostly active in their participation of session activities and group discussion. Pt appropriately painted a canvas while in group.    Plan: Continue to engage patient in RT group sessions 2-3x/week.   Jeoffrey BRAVO Celestia, LRT, CTRS 09/11/2024 4:35 PM

## 2024-09-11 NOTE — Progress Notes (Signed)
 Patient pleasant and responsive during interaction. Able to make needs known. Patient observed in dayroom much of the day for group and meals. Accepted medications without issue. Denies SI/HI/AVH. Wound care complete. Ongoing monitoring continues.   09/11/24 1100  Psych Admission Type (Psych Patients Only)  Admission Status Involuntary  Psychosocial Assessment  Patient Complaints Confusion  Eye Contact Fair  Facial Expression Animated  Affect Appropriate to circumstance  Speech Tangential  Interaction Assertive  Motor Activity Slow  Appearance/Hygiene In scrubs;Layered clothes  Behavior Characteristics Cooperative;Calm  Mood Pleasant  Thought Process  Coherency Tangential  Content Preoccupation  Delusions None reported or observed  Perception WDL  Hallucination None reported or observed  Judgment Impaired  Confusion Mild  Danger to Self  Current suicidal ideation? Denies  Danger to Others  Danger to Others None reported or observed

## 2024-09-11 NOTE — Group Note (Signed)
 Physical/Occupational Therapy Group Note  Group Topic: Pain Management and Coping   Group Date: 09/11/2024 Start Time: 1300 End Time: 1345 Facilitators: Clive Warren CROME, OT   Group Description: Group discussed impact of chronic/acute pain on safety and independence with functional tasks and impact on mental health.  Identified and discussed any previously learned or implemented strategies used.  Discussed and reviewed cognitive behavioral pain coping strategies to address/improve overall management of pain. Discussed relaxation, distraction techniques, cognitive restructuring, activity pacing/energy conservation, environment/home safety modifications, and role of sleep and sleep hygiene. Allowed time for questions and further discussion.  Therapeutic Goal(s):  Identify and discuss previously utilized pain coping strategies and implications of pain on function/well-being Identify and discuss implementing new cognitive behavioral pain coping strategies into daily routines Demonstrate understanding and performance of learned cognitive behavioral pain coping strategies  Individual Participation: Pt entered session approx half way through and remained until the end. Pt very tangential demonstrating little to no ability to connect learned instruction/education to his experience and unable to identify strategies.   Participation Level: Minimal   Participation Quality: Moderate Cues   Behavior: Alert, Eager, and Interactive    Speech/Thought Process: Disorganized, Tangential , and Unfocused   Affect/Mood: Appropriate   Insight: Limited   Judgement: Limited   Modes of Intervention: Activity, Clarification, Discussion, Education, Exploration, Problem-solving, Rapport Building, Socialization, and Support  Patient Response to Interventions:  Attentive   Plan: Continue to engage patient in PT/OT groups 1 - 2x/week.  Trevan Messman R., MPH, MS, OTR/L ascom [phone] 09/11/24, 4:05 PM'

## 2024-09-11 NOTE — Plan of Care (Signed)
" °  Problem: Activity: Goal: Interest or engagement in activities will improve Outcome: Progressing   Problem: Coping: Goal: Ability to verbalize frustrations and anger appropriately will improve Outcome: Progressing   Problem: Education: Goal: Emotional status will improve Outcome: Progressing Goal: Mental status will improve Outcome: Progressing   Problem: Coping: Goal: Ability to verbalize frustrations and anger appropriately will improve Outcome: Progressing   Problem: Safety: Goal: Periods of time without injury will increase Outcome: Progressing   Problem: Coping: Goal: Ability to adjust to condition or change in health will improve Outcome: Progressing   "

## 2024-09-11 NOTE — Plan of Care (Signed)
   Problem: Activity: Goal: Interest or engagement in activities will improve Outcome: Progressing   Problem: Coping: Goal: Ability to verbalize frustrations and anger appropriately will improve Outcome: Progressing

## 2024-09-11 NOTE — Group Note (Deleted)
 Date:  09/11/2024 Time:  8:41 PM  Group Topic/Focus:  Wrap-Up Group:   The focus of this group is to help patients review their daily goal of treatment and discuss progress on daily workbooks.     Participation Level:  {BHH PARTICIPATION OZCZO:77735}  Participation Quality:  {BHH PARTICIPATION QUALITY:22265}  Affect:  {BHH AFFECT:22266}  Cognitive:  {BHH COGNITIVE:22267}  Insight: {BHH Insight2:20797}  Engagement in Group:  {BHH ENGAGEMENT IN HMNLE:77731}  Modes of Intervention:  {BHH MODES OF INTERVENTION:22269}  Additional Comments:  ***  Nolyn Eilert T Deidrea Gaetz 09/11/2024, 8:41 PM

## 2024-09-11 NOTE — Progress Notes (Signed)
 [facility] MD Progress Note  09/11/2024 9:46 PM MERWIN BREDEN  MRN:  [id number]  Mallie Linnemann. Bolger is a 74 year old male with a past psychiatric history significant for schizophrenia who presents under involuntary commitment initiated by family due to medication non-adherence, increasing paranoia, and behavioral disorganization. 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 Brother reports multiple episodes of patient whispering through the night, stating people were talking about him and planning to harm him. Patient was advised by his ACT Team to come to the ED; he refused, prompting the family to pursue IVC.Patient is admitted to [facility] unit with Q15 min safety monitoring. Multidisciplinary team approach is offered. Medication management; group/milieu therapy is offered.   Subjective:  Chart reviewed, case discussed in multidisciplinary meeting, patient seen during rounds.   Patient is noted to be sitting in the day area.  He informed the provider that his brother and family called him and checked on him, told him that they would visit him soon.  Patient remains delusional and disorganized talking about having issues with his family members.  He will answer the question linearly but goes of tangential.  But not redirected.  He denies SI/HI/plan and denies hallucinations.  Past Psychiatric History: see h&P Family History:  Family History  Family history unknown: Yes   Social History:  Social History   Substance and Sexual Activity  Alcohol Use No     Social History   Substance  and Sexual Activity  Drug Use No    Social History   Socioeconomic History   Marital status: Divorced    Spouse name: Not on file   Number of children: Not on file   Years of education: Not on file   Highest education level: Not on file  Occupational History   Not on file  Tobacco Use   Smoking status: Every Day    Current packs/day: 0.50    Average packs/day: 0.5 packs/day for 46.0 years (23.0 ttl pk-yrs)    Types: Cigarettes    Start date: 47   Smokeless tobacco: Never  Vaping Use   Vaping status: Never Used  Substance and Sexual Activity   Alcohol use: No   Drug use: No   Sexual activity: Not on file  Other Topics Concern   Not on file  Social History Narrative   Not on file   Social Drivers of Health   Tobacco Use: High Risk (07/18/2024)   Patient History    Smoking Tobacco Use: Every Day    Smokeless Tobacco Use: Never    Passive Exposure: Not on file  Financial Resource Strain: Not on file  Food Insecurity: Patient Unable To Answer (07/18/2024)   Epic    Worried About Programme Researcher, Broadcasting/film/video in the Last Year: Patient unable to answer    Ran Out of Food in the Last Year: Patient unable to answer  Transportation Needs: Unmet Transportation Needs (07/18/2024)   Epic    Lack of Transportation (Medical): Yes    Lack of Transportation (Non-Medical): Yes  Physical Activity: Not on file  Stress: Not on file  Social Connections: Patient Unable To Answer (07/18/2024)   Social Connection and Isolation Panel    Frequency of  Communication with Friends and Family: Patient unable to answer    Frequency of Social Gatherings with Friends and Family: Patient unable to answer    Attends Religious Services: Patient unable to answer    Active Member of Clubs or Organizations: Patient unable to answer    Attends Banker Meetings: Patient unable to answer    Marital Status: Patient unable to answer  Depression (PHQ2-9): Not on file  Alcohol Screen: Low Risk  (07/18/2024)   Alcohol Screen    Last Alcohol Screening Score (AUDIT): 0  Housing: Unknown (07/18/2024)   Epic    Unable to Pay for Housing in the Last Year: Patient unable to answer    Number of Times Moved in the Last Year: 0    Homeless in the Last Year: Patient declined  Utilities: Patient Unable To Answer (07/18/2024)   Epic    Threatened with loss of utilities: Patient unable to answer  Health Literacy: Not on file   Past Medical History:  Past Medical History:  Diagnosis Date   Biventricular failure (HCC)    CVA (cerebral vascular accident) (HCC)    Polysubstance use disorder    Schizophrenia (HCC)     Past Surgical History:  Procedure Laterality Date   APPENDECTOMY     TRACHEOSTOMY TUBE PLACEMENT N/A 03/04/2024   Procedure: CREATION, TRACHEOSTOMY;  Surgeon: [name] Massie RAMAN, MD;  Location: [facility] ORS;  Service: ENT;  Laterality: N/A;    Current Medications: Current Facility-Administered Medications  Medication Dose Route Frequency Provider Last Rate Last Admin   acetaminophen  (TYLENOL ) tablet 650 mg  650 mg Oral Q6H PRN Hampton, Tracie B, NP   650 mg at 09/07/24 2100   alum & mag hydroxide-simeth (MAALOX/MYLANTA) 200-200-20 MG/5ML suspension 30 mL  30 mL Oral Q4H PRN Hampton, Tracie B, NP   30 mL at 08/05/24 0306   amiodarone  (PACERONE ) tablet 400 mg  400 mg Oral Daily [name], [name], MD   400 mg at 09/11/24 9048   ARIPiprazole  ER (ABILIFY  MAINTENA) injection 400 mg  400 mg Intramuscular Q28 days Deshonda Cryderman, MD   400 mg at 08/24/24 9065   benzocaine  (ORAJEL) 10 % mucosal gel   Mouth/Throat TID PRN [name], [name], MD       divalproex  (DEPAKOTE  ER) 24 hr tablet 1,000 mg  1,000 mg Oral QHS Ellie Bryand, MD   1,000 mg at 09/11/24 2108   docusate sodium  (COLACE) capsule 100 mg  100 mg Oral Daily Allayah Raineri, MD   100 mg at 09/11/24 0951   feeding supplement (ENSURE PLUS HIGH PROTEIN) liquid 237 mL  237 mL Oral TID BM [name], [name], MD   237 mL at 09/11/24 2027    hydrOXYzine  (ATARAX ) tablet 25 mg  25 mg Oral TID PRN Hampton, Tracie B, NP   25 mg at 09/11/24 2107   magnesium  hydroxide (MILK OF MAGNESIA) suspension 30 mL  30 mL Oral Daily PRN Hampton, Tracie B, NP   30 mL at 08/08/24 2233   memantine  (NAMENDA ) tablet 10 mg  10 mg Oral Daily Charee Tumblin, MD   10 mg at 09/11/24 9048   metoprolol  tartrate (LOPRESSOR ) tablet 12.5 mg  12.5 mg Oral BID [name], [name], MD   12.5 mg at 09/11/24 2107   naproxen  (NAPROSYN ) tablet 250 mg  250 mg Oral BID WC Inell Mimbs, MD   250 mg at 09/11/24 1636   OLANZapine  (ZYPREXA ) injection 5 mg  5 mg Intramuscular TID PRN Hampton, Tracie B, NP  OLANZapine  zydis (ZYPREXA ) disintegrating tablet 5 mg  5 mg Oral TID PRN Hampton, Tracie B, NP   5 mg at 08/04/24 1129   oxyCODONE  (Oxy IR/ROXICODONE ) immediate release tablet 5 mg  5 mg Oral Q4H PRN Dorinda Homans T, MD   5 mg at 09/01/24 2102   pantoprazole  (PROTONIX ) EC tablet 40 mg  40 mg Oral Daily [name], [name], MD   40 mg at 09/11/24 0951   sodium chloride  (OCEAN) 0.65 % nasal spray 1 spray  1 spray Each Nare PRN Donnelly Mellow, MD   1 spray at 08/18/24 1459   traZODone  (DESYREL ) tablet 50 mg  50 mg Oral QHS PRN Hampton, Tracie B, NP   50 mg at 09/11/24 2107    Lab Results:  No results found for this or any previous visit (from the past 48 hours).     Blood Alcohol level:  Lab Results  Component Value Date   [facility] <15 07/17/2024   ETH <15 02/20/2024    Metabolic Disorder Labs: Lab Results  Component Value Date   HGBA1C 5.4 08/12/2024   MPG 108.28 08/12/2024   MPG 119.76 07/14/2018   No results found for: PROLACTIN Lab Results  Component Value Date   CHOL 139 08/12/2024   TRIG 73 08/12/2024   HDL 52 08/12/2024   CHOLHDL 2.7 08/12/2024   VLDL 15 08/12/2024   LDLCALC 72 08/12/2024   LDLCALC 119 (H) 07/13/2018    Physical Findings: AIMS:  , ,  ,  ,    CIWA:    COWS:      Psychiatric Specialty Exam:  Presentation  General  Appearance: Appropriate for Environment  Eye Contact:Fleeting  Speech:Normal Rate  Speech Volume:Decreased    Mood and Affect  Mood:Anxious  Affect:Flat   Thought Process  Thought Processes:circumstantial Orientation:Partial  Thought Content:discharge focused  Hallucinations:denies  Ideas of Reference:Delusions At baseline Suicidal Thoughts:denies  Homicidal Thoughts:denies   Sensorium  Memory:Immediate Poor; Recent Poor; Remote Poor  Judgment:Impaired  Insight:None   Executive Functions  Concentration:Poor  Attention Span:Poor  Recall:Poor  Fund of Knowledge:Poor  Language:Fair   Psychomotor Activity  Psychomotor Activity:No data recorded  Musculoskeletal: Strength & Muscle Tone: within normal limits Gait & Station: normal Assets  Assets:Communication Skills; Desire for Improvement; Social Support    Physical Exam: Physical Exam Vitals and nursing note reviewed.    ROS Blood pressure (!) 149/77, pulse 68, temperature 98 F (36.7 C), resp. rate 19, height 6' 1 (1.854 m), weight 81.2 kg, SpO2 97%. Body mass index is 23.62 kg/m.  Diagnosis: Active Problems:   Schizophrenia, paranoid (HCC)   Dementia with behavioral disturbance (HCC)   PLAN: Safety and Monitoring:  -- Involuntary admission to inpatient psychiatric unit for safety, stabilization and treatment  -- Daily contact with patient to assess and evaluate symptoms and progress in treatment  -- Patient's case to be discussed in multi-disciplinary team meeting  -- Observation Level : q15 minute checks  -- Vital signs:  q12 hours  -- Precautions: suicide, elopement, and assault -- Encouraged patient to participate in unit milieu and in scheduled group therapies  2. Psychiatric Treatment:  Scheduled Medications:  Discontinued oral Abilify  LAY Abilify  Maintena 400 mg q4 weeks given on 07/27/24, 08/24/2024  Cogentin  1mg  one dose given for EPS Depakote  ER increased to 1000 mg to  help with mood stabilization  Depakote  levels 09/02/24: 57  -- The risks/benefits/side-effects/alternatives to this medication were discussed in detail with the patient and time was given for questions. The  patient consents to medication trial.  3. Medical Issues Being Addressed:  [facility] consulted for wound care management   Assessment and Plan:   Sacral Decubitus Ulcer Full thickness decubitus ulcer in setting of recent extended hospitalization, discharged from [facility] 14th Wound looks pretty good overall WOC on board we appreciate input   Cognitive Deficit Concern for anoxic brain injury at discharge 02/2024 Delirium precautions   Peripheral Vascular Disease Legs with features c/w peripheral vascular disease, diminished DP pulses Would benefit from outpatient ABI's   HFrEF Euvolemic at this time Continue home medications   Hx Atrial Flutter Not on any anticoagulation at this time EKG with NSR Continue amiodarone   Hypertension Noted, adjust meds gradually after med rec complete   Tooth pain/Infection - Added: Augmentin  along with Orajel BP -better today no longer hypotensive-Will continue reduced dose of metoprolol  12.5 bid.   4. Discharge Planning:   -- Social work and case management to assist with discharge planning and identification of hospital follow-up needs prior to discharge  -- Estimated LOS: 3-4 days  Julus Kelley, MD 09/11/2024, 9:46 PM

## 2024-09-11 NOTE — Group Note (Signed)
 Date:  09/11/2024 Time:  8:45 PM  Group Topic/Focus:  Wrap-Up Group:   The focus of this group is to help patients review their daily goal of treatment and discuss progress on daily workbooks.    Participation Level:  Active  Participation Quality:  Appropriate  Affect:  Appropriate  Cognitive:  Appropriate  Insight: Appropriate  Engagement in Group:  Engaged  Modes of Intervention:  Discussion  Additional Comments:    [name] 09/11/2024, 8:45 PM

## 2024-09-11 NOTE — Progress Notes (Signed)
 Patient was pleasant and cooperative. Tolerated medications well. Denies SI/HI/AVH. Slept for 4.5 hours.     09/10/24 2200  Psych Admission Type (Psych Patients Only)  Admission Status Involuntary  Psychosocial Assessment  Patient Complaints Confusion  Eye Contact Fair  Facial Expression Animated  Affect Appropriate to circumstance  Speech Tangential  Interaction Assertive  Motor Activity Slow  Appearance/Hygiene In scrubs;Layered clothes  Behavior Characteristics Cooperative  Mood Pleasant  Thought Process  Coherency Tangential  Content Preoccupation  Delusions None reported or observed  Perception WDL  Hallucination None reported or observed  Judgment Impaired  Confusion Mild  Danger to Self  Current suicidal ideation? Denies  Danger to Others  Danger to Others None reported or observed

## 2024-09-11 NOTE — Group Note (Signed)
 Date:  09/11/2024 Time:  10:03 AM  Group Topic/Focus:   Goals Group:   The focus of this group is to help patients establish daily goals to achieve during treatment and discuss how the patient can incorporate goal setting into their daily lives to aide in recovery.    Participation Level:  Minimal  Participation Quality:  Appropriate, Attentive, and Drowsy  Affect:  Appropriate and Not Congruent  Cognitive:  Alert and Confused  Insight: Appropriate and Limited  Engagement in Group:  Engaged and Off Topic  Modes of Intervention:  Activity and Discussion  Additional Comments:     Maglione,Albirda Shiel E 09/11/2024, 10:03 AM

## 2024-09-12 DIAGNOSIS — F03918 Unspecified dementia, unspecified severity, with other behavioral disturbance: Secondary | ICD-10-CM | POA: Diagnosis not present

## 2024-09-12 DIAGNOSIS — F200 Paranoid schizophrenia: Secondary | ICD-10-CM | POA: Diagnosis not present

## 2024-09-12 NOTE — Group Note (Signed)
 [facility] LCSW Group Therapy Note   Group Date: 09/12/2024 Start Time: 1405 End Time: 1435   Type of Therapy/Topic:  Group Therapy:  Balance in Life  Participation Level:  Active   Description of Group:    This group will address the concept of balance and how it feels and looks when one is unbalanced. Patients will be encouraged to process areas in their lives that are out of balance, and identify reasons for remaining unbalanced. Facilitators will guide patients utilizing problem- solving interventions to address and correct the stressor making their life unbalanced. Understanding and applying boundaries will be explored and addressed for obtaining  and maintaining a balanced life. Patients will be encouraged to explore ways to assertively make their unbalanced needs known to significant others in their lives, using other group members and facilitator for support and feedback.  Therapeutic Goals: Patient will identify two or more emotions or situations they have that consume much of in their lives. Patient will identify signs/triggers that life has become out of balance:  Patient will identify two ways to set boundaries in order to achieve balance in their lives:  Patient will demonstrate ability to communicate their needs through discussion and/or role plays  Summary of Patient Progress: The facilitator and patients discussed 7 steps to finding balance in life. Group members chose 3 affirmation stickers that they focused on to bring positivity to every situation or challenge.  The patient reflected on their own personal thoughts and feelings about themselves and explained why they chose that particular affirmation.  The patient was receptive to feedback from both peers and the facilitator and contributed to creating a supportive environment, encouraging others to open up and share.   Therapeutic Modalities:   Cognitive Behavioral Therapy Solution-Focused Therapy Assertiveness  Training   Rexene LELON Mae, LCSWA

## 2024-09-12 NOTE — Plan of Care (Signed)
" °  Problem: Activity: Goal: Interest or engagement in activities will improve Outcome: Progressing   Problem: Coping: Goal: Ability to verbalize frustrations and anger appropriately will improve Outcome: Progressing   Problem: Education: Goal: Emotional status will improve Outcome: Progressing Goal: Mental status will improve Outcome: Progressing   Problem: Activity: Goal: Sleeping patterns will improve Outcome: Progressing   "

## 2024-09-12 NOTE — Group Note (Signed)
 Date:  09/12/2024 Time:  9:44 AM  Group Topic/Focus:  Coping With Mental Health Crisis:   The purpose of this group is to help patients identify strategies for coping with mental health crisis.  Group discusses possible causes of crisis and ways to manage them effectively.    Participation Level:  Did Not Attend    [name] 09/12/2024, 9:44 AM

## 2024-09-12 NOTE — Progress Notes (Signed)
 Pt disoriented to situation. He denies SI/HI/AVH. He is forgetful but pleasant this shift. PRN Atarax  25 mg and Trazodone  50 mg were effective. He slept well throughout the night only getting up once. Pt is safe on unit at this time with Q 15 min safety checks in place.   09/11/24 2108  Psych Admission Type (Psych Patients Only)  Admission Status Involuntary  Psychosocial Assessment  Patient Complaints Confusion  Eye Contact Fair  Facial Expression Animated  Affect Appropriate to circumstance  Speech Tangential  Interaction Assertive  Motor Activity Slow  Appearance/Hygiene Layered clothes  Behavior Characteristics Cooperative  Mood Pleasant  Thought Process  Coherency Tangential  Content Preoccupation  Delusions None reported or observed  Perception WDL  Hallucination None reported or observed  Judgment Impaired  Confusion Mild  Danger to Self  Current suicidal ideation? Denies  Danger to Others  Danger to Others None reported or observed

## 2024-09-12 NOTE — Progress Notes (Signed)
 Behavior:  Confused.  Pleasant and cooperative.  Falls asleep multiple times during the day.   Psych assessment:  Denies SI/HI and AVH.   Group attendance:  2/3  Medication/ PRNs: Compliant.  Pain: Denies.  15 min checks in place for safety.

## 2024-09-12 NOTE — Progress Notes (Signed)
 [facility] MD Progress Note  09/12/2024 1:46 PM DEMTRIUS ROUNDS  MRN:  [id number]  Miguel [name] is a 74 year old male with a past psychiatric history significant for schizophrenia who presents under involuntary commitment initiated by family due to medication non-adherence, increasing paranoia, and behavioral disorganization. 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 Brother reports multiple episodes of patient whispering through the night, stating people were talking about him and planning to harm him. Patient was advised by his ACT Team to come to the ED; he refused, prompting the family to pursue IVC.Patient is admitted to [facility] unit with Q15 min safety monitoring. Multidisciplinary team approach is offered. Medication management; group/milieu therapy is offered.   Subjective:  Chart reviewed, case discussed in multidisciplinary meeting, patient seen during rounds.   Patient is noted to be sitting in the day area.  He informed the provider that his brother and family called him and checked on him, told him that they would visit him soon.  Patient remains delusional and disorganized talking about having issues with his family members.  He will answer the question linearly but goes of tangential.  But not redirected.  He denies SI/HI/plan and denies hallucinations.  Past Psychiatric History: see h&P Family History:  Family History  Family history unknown: Yes   Social History:  Social History   Substance and Sexual Activity  Alcohol Use No     Social History   Substance  and Sexual Activity  Drug Use No    Social History   Socioeconomic History   Marital status: Divorced    Spouse name: Not on file   Number of children: Not on file   Years of education: Not on file   Highest education level: Not on file  Occupational History   Not on file  Tobacco Use   Smoking status: Every Day    Current packs/day: 0.50    Average packs/day: 0.5 packs/day for 46.0 years (23.0 ttl pk-yrs)    Types: Cigarettes    Start date: 56   Smokeless tobacco: Never  Vaping Use   Vaping status: Never Used  Substance and Sexual Activity   Alcohol use: No   Drug use: No   Sexual activity: Not on file  Other Topics Concern   Not on file  Social History Narrative   Not on file   Social Drivers of Health   Tobacco Use: High Risk (07/18/2024)   Patient History    Smoking Tobacco Use: Every Day    Smokeless Tobacco Use: Never    Passive Exposure: Not on file  Financial Resource Strain: Not on file  Food Insecurity: Patient Unable To Answer (07/18/2024)   Epic    Worried About Programme Researcher, Broadcasting/film/video in the Last Year: Patient unable to answer    Ran Out of Food in the Last Year: Patient unable to answer  Transportation Needs: Unmet Transportation Needs (07/18/2024)   Epic    Lack of Transportation (Medical): Yes    Lack of Transportation (Non-Medical): Yes  Physical Activity: Not on file  Stress: Not on file  Social Connections: Patient Unable To Answer (07/18/2024)   Social Connection and Isolation Panel    Frequency of  Communication with Friends and Family: Patient unable to answer    Frequency of Social Gatherings with Friends and Family: Patient unable to answer    Attends Religious Services: Patient unable to answer    Active Member of Clubs or Organizations: Patient unable to answer    Attends Banker Meetings: Patient unable to answer    Marital Status: Patient unable to answer  Depression (PHQ2-9): Not on file  Alcohol Screen: Low Risk  (07/18/2024)   Alcohol Screen    Last Alcohol Screening Score (AUDIT): 0  Housing: Unknown (07/18/2024)   Epic    Unable to Pay for Housing in the Last Year: Patient unable to answer    Number of Times Moved in the Last Year: 0    Homeless in the Last Year: Patient declined  Utilities: Patient Unable To Answer (07/18/2024)   Epic    Threatened with loss of utilities: Patient unable to answer  Health Literacy: Not on file   Past Medical History:  Past Medical History:  Diagnosis Date   Biventricular failure (HCC)    CVA (cerebral vascular accident) (HCC)    Polysubstance use disorder    Schizophrenia (HCC)     Past Surgical History:  Procedure Laterality Date   APPENDECTOMY     TRACHEOSTOMY TUBE PLACEMENT N/A 03/04/2024   Procedure: CREATION, TRACHEOSTOMY;  Surgeon: [name] Massie RAMAN, MD;  Location: [facility] ORS;  Service: ENT;  Laterality: N/A;    Current Medications: Current Facility-Administered Medications  Medication Dose Route Frequency Provider Last Rate Last Admin   acetaminophen  (TYLENOL ) tablet 650 mg  650 mg Oral Q6H PRN Hampton, Tracie B, NP   650 mg at 09/07/24 2100   alum & mag hydroxide-simeth (MAALOX/MYLANTA) 200-200-20 MG/5ML suspension 30 mL  30 mL Oral Q4H PRN Hampton, Tracie B, NP   30 mL at 08/05/24 0306   amiodarone  (PACERONE ) tablet 400 mg  400 mg Oral Daily [name], [name], MD   400 mg at 09/12/24 1013   ARIPiprazole  ER (ABILIFY  MAINTENA) injection 400 mg  400 mg Intramuscular Q28 days [name], [name], MD   400 mg at 08/24/24 9065   benzocaine  (ORAJEL) 10 % mucosal gel   Mouth/Throat TID PRN Holly Iannaccone R, MD       divalproex  (DEPAKOTE  ER) 24 hr tablet 1,000 mg  1,000 mg Oral QHS [name], [name], MD   1,000 mg at 09/11/24 2108   docusate sodium  (COLACE) capsule 100 mg  100 mg Oral Daily [name], [name], MD   100 mg at 09/12/24 1013   feeding supplement (ENSURE PLUS HIGH PROTEIN) liquid 237 mL  237 mL Oral TID BM Paublo Warshawsky R, MD   237 mL at 09/12/24 1023    hydrOXYzine  (ATARAX ) tablet 25 mg  25 mg Oral TID PRN Hampton, Tracie B, NP   25 mg at 09/11/24 2107   magnesium  hydroxide (MILK OF MAGNESIA) suspension 30 mL  30 mL Oral Daily PRN Hampton, Tracie B, NP   30 mL at 08/08/24 2233   memantine  (NAMENDA ) tablet 10 mg  10 mg Oral Daily [name], [name], MD   10 mg at 09/12/24 1010   metoprolol  tartrate (LOPRESSOR ) tablet 12.5 mg  12.5 mg Oral BID [name], [name], MD   12.5 mg at 09/12/24 1010   naproxen  (NAPROSYN ) tablet 250 mg  250 mg Oral BID WC [name], [name], MD   250 mg at 09/12/24 1010   OLANZapine  (ZYPREXA ) injection 5 mg  5 mg Intramuscular TID PRN Hampton, Tracie B, NP  OLANZapine  zydis (ZYPREXA ) disintegrating tablet 5 mg  5 mg Oral TID PRN Hampton, Tracie B, NP   5 mg at 08/04/24 1129   oxyCODONE  (Oxy IR/ROXICODONE ) immediate release tablet 5 mg  5 mg Oral Q4H PRN Dorinda Homans T, MD   5 mg at 09/01/24 2102   pantoprazole  (PROTONIX ) EC tablet 40 mg  40 mg Oral Daily [name], [name], MD   40 mg at 09/12/24 1010   sodium chloride  (OCEAN) 0.65 % nasal spray 1 spray  1 spray Each Nare PRN Donnelly Mellow, MD   1 spray at 08/18/24 1459   traZODone  (DESYREL ) tablet 50 mg  50 mg Oral QHS PRN Hampton, Tracie B, NP   50 mg at 09/11/24 2107    Lab Results:  No results found for this or any previous visit (from the past 48 hours).     Blood Alcohol level:  Lab Results  Component Value Date   [facility] <15 07/17/2024   ETH <15 02/20/2024    Metabolic Disorder Labs: Lab Results  Component Value Date   HGBA1C 5.4 08/12/2024   MPG 108.28 08/12/2024   MPG 119.76 07/14/2018   No results found for: PROLACTIN Lab Results  Component Value Date   CHOL 139 08/12/2024   TRIG 73 08/12/2024   HDL 52 08/12/2024   CHOLHDL 2.7 08/12/2024   VLDL 15 08/12/2024   LDLCALC 72 08/12/2024   LDLCALC 119 (H) 07/13/2018    Physical Findings: AIMS:  , ,  ,  ,    CIWA:    COWS:      Psychiatric Specialty Exam:  Presentation  General  Appearance: Appropriate for Environment  Eye Contact:Fleeting  Speech:Normal Rate  Speech Volume:Decreased    Mood and Affect  Mood:Anxious  Affect:Flat   Thought Process  Thought Processes:circumstantial Orientation:Partial  Thought Content:discharge focused  Hallucinations:denies  Ideas of Reference:Delusions At baseline Suicidal Thoughts:denies  Homicidal Thoughts:denies   Sensorium  Memory:Immediate Poor; Recent Poor; Remote Poor  Judgment:Impaired  Insight:None   Executive Functions  Concentration:Poor  Attention Span:Poor  Recall:Poor  Fund of Knowledge:Poor  Language:Fair   Psychomotor Activity  Psychomotor Activity:No data recorded  Musculoskeletal: Strength & Muscle Tone: within normal limits Gait & Station: normal Assets  Assets:Communication Skills; Desire for Improvement; Social Support    Physical Exam: Physical Exam Vitals and nursing note reviewed.    ROS Blood pressure 110/61, pulse 64, temperature 98.7 F (37.1 C), resp. rate 17, height 6' 1 (1.854 m), weight 81.2 kg, SpO2 97%. Body mass index is 23.62 kg/m.  Diagnosis: Active Problems:   Schizophrenia, paranoid (HCC)   Dementia with behavioral disturbance (HCC)   PLAN: Safety and Monitoring:  -- Involuntary admission to inpatient psychiatric unit for safety, stabilization and treatment  -- Daily contact with patient to assess and evaluate symptoms and progress in treatment  -- Patient's case to be discussed in multi-disciplinary team meeting  -- Observation Level : q15 minute checks  -- Vital signs:  q12 hours  -- Precautions: suicide, elopement, and assault -- Encouraged patient to participate in unit milieu and in scheduled group therapies  2. Psychiatric Treatment:  Scheduled Medications:  Discontinued oral Abilify  LAY Abilify  Maintena 400 mg q4 weeks given on 07/27/24, 08/24/2024  Cogentin  1mg  one dose given for EPS Depakote  ER increased to 1000 mg to  help with mood stabilization  Depakote  levels 09/02/24: 57  -- The risks/benefits/side-effects/alternatives to this medication were discussed in detail with the patient and time was given for questions. The patient  consents to medication trial.  3. Medical Issues Being Addressed:  [facility] consulted for wound care management   Assessment and Plan:   Sacral Decubitus Ulcer Full thickness decubitus ulcer in setting of recent extended hospitalization, discharged from [facility] Nov 14th Wound looks pretty good overall WOC on board we appreciate input   Cognitive Deficit Concern for anoxic brain injury at discharge 02/2024 Delirium precautions   Peripheral Vascular Disease Legs with features c/w peripheral vascular disease, diminished DP pulses Would benefit from outpatient ABI's   HFrEF Euvolemic at this time Continue home medications   Hx Atrial Flutter Not on any anticoagulation at this time EKG with NSR Continue amiodarone   Hypertension Noted, adjust meds gradually after med rec complete   Tooth pain/Infection - Added: Augmentin  along with Orajel BP -better today no longer hypotensive-Will continue reduced dose of metoprolol  12.5 bid.   4. Discharge Planning:   -- Social work and case management to assist with discharge planning and identification of hospital follow-up needs prior to discharge  -- Estimated LOS: 3-4 days  Miguel JONELLE Manners, MD 09/12/2024, 1:46 PM

## 2024-09-12 NOTE — Group Note (Signed)
 Date:  09/12/2024 Time:  4:59 PM  Group Topic/Focus:  Wellness Toolbox:   The focus of this group is to discuss various aspects of wellness, balancing those aspects and exploring ways to increase the ability to experience wellness.  Patients will create a wellness toolbox for use upon discharge.    Participation Level:  Minimal  Participation Quality:  Drowsy  Affect:  Appropriate  Cognitive:  Confused and Lacking  Insight: Limited  Engagement in Group:  Limited and Off Topic  Modes of Intervention:  Activity and Discussion  Additional Comments:     Maglione,Rainelle Sulewski E 09/12/2024, 4:59 PM

## 2024-09-12 NOTE — Plan of Care (Signed)
  Problem: Activity: Goal: Interest or engagement in activities will improve Outcome: Progressing   Problem: Education: Goal: Mental status will improve Outcome: Not Progressing Goal: Verbalization of understanding the information provided will improve Outcome: Not Progressing   

## 2024-09-13 DIAGNOSIS — F03918 Unspecified dementia, unspecified severity, with other behavioral disturbance: Secondary | ICD-10-CM | POA: Diagnosis not present

## 2024-09-13 DIAGNOSIS — F200 Paranoid schizophrenia: Secondary | ICD-10-CM | POA: Diagnosis not present

## 2024-09-13 NOTE — Plan of Care (Signed)
   Problem: Education: Goal: Knowledge of [facility] Education information/materials will improve Outcome: Progressing   Problem: Coping: Goal: Ability to verbalize frustrations and anger appropriately will improve Outcome: Progressing

## 2024-09-13 NOTE — Progress Notes (Signed)
" °   09/13/24 2200  Psych Admission Type (Psych Patients Only)  Admission Status Involuntary  Psychosocial Assessment  Patient Complaints None  Eye Contact Other (Comment) (WDL)  Facial Expression Other (Comment) (WDL)  Affect Appropriate to circumstance  Speech Tangential;Soft  Interaction Assertive  Motor Activity Slow  Appearance/Hygiene Layered clothes  Behavior Characteristics Appropriate to situation;Cooperative  Mood Pleasant  Aggressive Behavior  Effect No apparent injury  Thought Process  Coherency Tangential  Content WDL  Delusions None reported or observed  Perception WDL  Hallucination None reported or observed  Judgment Limited  Confusion Mild  Danger to Self  Current suicidal ideation? Denies  Agreement Not to Harm Self Yes  Description of Agreement Verbal  Danger to Others  Danger to Others None reported or observed    "

## 2024-09-13 NOTE — Progress Notes (Signed)
 Wound care performed. 5x5x4. ,mild odor noted and discharge present. Updated photo loaded in pt chart. MD made aware.   Ayisha Pol S.,RN

## 2024-09-13 NOTE — Progress Notes (Signed)
" °   09/13/24 0100  Psych Admission Type (Psych Patients Only)  Admission Status Involuntary  Psychosocial Assessment  Patient Complaints Confusion  Eye Contact Fair  Facial Expression Animated  Affect Appropriate to circumstance  Speech Tangential  Interaction Assertive  Motor Activity Slow  Appearance/Hygiene Layered clothes  Behavior Characteristics Cooperative  Mood Pleasant  Thought Process  Coherency Tangential  Content Preoccupation  Delusions None reported or observed  Perception WDL  Hallucination None reported or observed  Judgment Poor  Confusion Mild  Danger to Self  Current suicidal ideation? Denies  Danger to Others  Danger to Others None reported or observed    "

## 2024-09-13 NOTE — Progress Notes (Signed)
 [facility] MD Progress Note  09/13/2024 1:02 PM [name]  MRN:  [id number]  Miguel [name]. Miguel [name] is a 74 year old male with a past psychiatric history significant for schizophrenia who presents under involuntary commitment initiated by family due to medication non-adherence, increasing paranoia, and behavioral disorganization. 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 Brother reports multiple episodes of patient whispering through the night, stating people were talking about him and planning to harm him. Patient was advised by his ACT Team to come to the ED; he refused, prompting the family to pursue IVC.Patient is admitted to [facility] unit with Q15 min safety monitoring. Multidisciplinary team approach is offered. Medication management; group/milieu therapy is offered.   Subjective:  Chart reviewed, case discussed in multidisciplinary meeting, patient seen during rounds.   The patient is a 74 year old male who has been hospitalized for approximately 56 days. Prior to admission, he was living with his brother. He continues to exhibit significant delusional thought processes and cognitive impairment, with ongoing confusion.  He has a wound currently being monitored by the medical team. At times, he becomes irritable and displays disorganized behaviors, including an episode of urinating on the floor; however, he remains redirectable. Sleep has reportedly been adequate. His affect and behavior can be pleasant and cooperative at times, though cognitive deficits significantly limit his ability  to care for himself independently.  His brother is in the process of pursuing guardianship. The patient remains pleasantly confused. Continued inpatient care is medically necessary due to cognitive impairment, behavioral dysregulation, need for redirection, medical comorbidities, and ongoing disposition planning.  Past Psychiatric History: see h&P Family History:  Family History  Family history unknown: Yes   Social History:  Social History   Substance and Sexual Activity  Alcohol Use No     Social History   Substance and Sexual Activity  Drug Use No    Social History   Socioeconomic History   Marital status: Divorced    Spouse name: Not on file   Number of children: Not on file   Years of education: Not on file   Highest education level: Not on file  Occupational History   Not on file  Tobacco Use   Smoking status: Every Day    Current packs/day: 0.50    Average packs/day: 0.5 packs/day for 46.0 years (23.0 ttl pk-yrs)    Types: Cigarettes    Start date: 72   Smokeless tobacco: Never  Vaping Use   Vaping status: Never Used  Substance and Sexual Activity   Alcohol use: No   Drug use: No   Sexual activity: Not on file  Other Topics Concern   Not on file  Social History Narrative   Not on file   Social Drivers of Health   Tobacco Use: High Risk (07/18/2024)   Patient History    Smoking Tobacco Use: Every Day    Smokeless Tobacco Use: Never    Passive Exposure: Not on file  Financial Resource Strain: Not on file  Food Insecurity: Patient Unable To Answer (07/18/2024)   Epic    Worried About Programme Researcher, Broadcasting/film/video in the Last Year: Patient unable to answer    [employer] of Food in  the Last Year: Patient unable to answer  Transportation Needs: Unmet Transportation Needs (07/18/2024)   Epic    Lack of Transportation (Medical): Yes    Lack of Transportation (Non-Medical): Yes  Physical Activity: Not on file  Stress: Not on file  Social Connections: Patient  Unable To Answer (07/18/2024)   Social Connection and Isolation Panel    Frequency of Communication with Friends and Family: Patient unable to answer    Frequency of Social Gatherings with Friends and Family: Patient unable to answer    Attends Religious Services: Patient unable to answer    Active Member of Clubs or Organizations: Patient unable to answer    Attends Banker Meetings: Patient unable to answer    Marital Status: Patient unable to answer  Depression (PHQ2-9): Not on file  Alcohol Screen: Low Risk (07/18/2024)   Alcohol Screen    Last Alcohol Screening Score (AUDIT): 0  Housing: Unknown (07/18/2024)   Epic    Unable to Pay for Housing in the Last Year: Patient unable to answer    Number of Times Moved in the Last Year: 0    Homeless in the Last Year: Patient declined  Utilities: Patient Unable To Answer (07/18/2024)   Epic    Threatened with loss of utilities: Patient unable to answer  Health Literacy: Not on file   Past Medical History:  Past Medical History:  Diagnosis Date   Biventricular failure (HCC)    CVA (cerebral vascular accident) (HCC)    Polysubstance use disorder    Schizophrenia (HCC)     Past Surgical History:  Procedure Laterality Date   APPENDECTOMY     TRACHEOSTOMY TUBE PLACEMENT N/A 03/04/2024   Procedure: CREATION, TRACHEOSTOMY;  Surgeon: [name] Massie RAMAN, MD;  Location: [facility] ORS;  Service: ENT;  Laterality: N/A;    Current Medications: Current Facility-Administered Medications  Medication Dose Route Frequency Provider Last Rate Last Admin   acetaminophen  (TYLENOL ) tablet 650 mg  650 mg Oral Q6H PRN Hampton, Tracie B, NP   650 mg at 09/07/24 2100   alum & mag hydroxide-simeth (MAALOX/MYLANTA) 200-200-20 MG/5ML suspension 30 mL  30 mL Oral Q4H PRN Hampton, Tracie B, NP   30 mL at 08/05/24 0306   amiodarone  (PACERONE ) tablet 400 mg  400 mg Oral Daily [name], [name], MD   400 mg at 09/13/24 1056   ARIPiprazole  ER (ABILIFY  MAINTENA)  injection 400 mg  400 mg Intramuscular Q28 days [name], [name], MD   400 mg at 08/24/24 9065   benzocaine  (ORAJEL) 10 % mucosal gel   Mouth/Throat TID PRN Meredith Mells R, MD       divalproex  (DEPAKOTE  ER) 24 hr tablet 1,000 mg  1,000 mg Oral QHS [name], [name], MD   1,000 mg at 09/12/24 2020   docusate sodium  (COLACE) capsule 100 mg  100 mg Oral Daily [name], [name], MD   100 mg at 09/13/24 1056   feeding supplement (ENSURE PLUS HIGH PROTEIN) liquid 237 mL  237 mL Oral TID BM Ritta Hammes R, MD   237 mL at 09/13/24 1149   hydrOXYzine  (ATARAX ) tablet 25 mg  25 mg Oral TID PRN Hampton, Tracie B, NP   25 mg at 09/11/24 2107   magnesium  hydroxide (MILK OF MAGNESIA) suspension 30 mL  30 mL Oral Daily PRN Hampton, Tracie B, NP   30 mL at 08/08/24 2233   memantine  (NAMENDA ) tablet 10 mg  10 mg Oral Daily [name], [name], MD   10 mg at 09/13/24  1056   metoprolol  tartrate (LOPRESSOR ) tablet 12.5 mg  12.5 mg Oral BID [name], [name], MD   12.5 mg at 09/13/24 1056   naproxen  (NAPROSYN ) tablet 250 mg  250 mg Oral BID WC [name], [name], MD   250 mg at 09/13/24 9144   OLANZapine  (ZYPREXA ) injection 5 mg  5 mg Intramuscular TID PRN Hampton, Tracie B, NP       OLANZapine  zydis (ZYPREXA ) disintegrating tablet 5 mg  5 mg Oral TID PRN Hampton, Tracie B, NP   5 mg at 08/04/24 1129   oxyCODONE  (Oxy IR/ROXICODONE ) immediate release tablet 5 mg  5 mg Oral Q4H PRN Dorinda Homans T, MD   5 mg at 09/01/24 2102   pantoprazole  (PROTONIX ) EC tablet 40 mg  40 mg Oral Daily [name], [name], MD   40 mg at 09/13/24 1056   sodium chloride  (OCEAN) 0.65 % nasal spray 1 spray  1 spray Each Nare PRN Donnelly Mellow, MD   1 spray at 08/18/24 1459   traZODone  (DESYREL ) tablet 50 mg  50 mg Oral QHS PRN Hampton, Tracie B, NP   50 mg at 09/11/24 2107    Lab Results:  No results found for this or any previous visit (from the past 48 hours).     Blood Alcohol level:  Lab Results  Component Value Date   [facility] <15 07/17/2024    ETH <15 02/20/2024    Metabolic Disorder Labs: Lab Results  Component Value Date   HGBA1C 5.4 08/12/2024   MPG 108.28 08/12/2024   MPG 119.76 07/14/2018   No results found for: PROLACTIN Lab Results  Component Value Date   CHOL 139 08/12/2024   TRIG 73 08/12/2024   HDL 52 08/12/2024   CHOLHDL 2.7 08/12/2024   VLDL 15 08/12/2024   LDLCALC 72 08/12/2024   LDLCALC 119 (H) 07/13/2018    Physical Findings: AIMS:  , ,  ,  ,    CIWA:    COWS:      Psychiatric Specialty Exam:  Presentation  General Appearance: Appropriate for Environment  Eye Contact:Fleeting  Speech:Normal Rate  Speech Volume:Decreased    Mood and Affect  Mood:Anxious  Affect:Flat   Thought Process  Thought Processes:circumstantial Orientation:Partial  Thought Content:discharge focused  Hallucinations:denies  Ideas of Reference:Delusions At baseline Suicidal Thoughts:denies  Homicidal Thoughts:denies   Sensorium  Memory:Immediate Poor; Recent Poor; Remote Poor  Judgment:Impaired  Insight:None   Executive Functions  Concentration:Poor  Attention Span:Poor  Recall:Poor  Fund of Knowledge:Poor  Language:Fair   Psychomotor Activity  Psychomotor Activity:No data recorded  Musculoskeletal: Strength & Muscle Tone: within normal limits Gait & Station: normal Assets  Assets:Communication Skills; Desire for Improvement; Social Support    Physical Exam: Physical Exam Vitals and nursing note reviewed.    ROS Blood pressure (!) 145/58, pulse 63, temperature 98.3 F (36.8 C), resp. rate 18, height 6' 1 (1.854 m), weight 81.2 kg, SpO2 99%. Body mass index is 23.62 kg/m.  Diagnosis: Active Problems:   Schizophrenia, paranoid (HCC)   Dementia with behavioral disturbance (HCC)   PLAN: Safety and Monitoring:  -- Involuntary admission to inpatient psychiatric unit for safety, stabilization and treatment  -- Daily contact with patient to assess and evaluate  symptoms and progress in treatment  -- Patient's case to be discussed in multi-disciplinary team meeting  -- Observation Level : q15 minute checks  -- Vital signs:  q12 hours  -- Precautions: suicide, elopement, and assault -- Encouraged patient to participate in unit milieu and in  scheduled group therapies  2. Psychiatric Treatment:  Scheduled Medications:  Discontinued oral Abilify  LAY Abilify  Maintena 400 mg q4 weeks given on 07/27/24, 08/24/2024  Cogentin  1mg  one dose given for EPS Depakote  ER increased to 1000 mg to help with mood stabilization  Depakote  levels 09/02/24: 57  -- The risks/benefits/side-effects/alternatives to this medication were discussed in detail with the patient and time was given for questions. The patient consents to medication trial.  3. Medical Issues Being Addressed:  [facility] consulted for wound care management   Assessment and Plan:   Sacral Decubitus Ulcer Full thickness decubitus ulcer in setting of recent extended hospitalization, discharged from [facility] 14th Wound looks pretty good overall WOC on board we appreciate input   Cognitive Deficit Concern for anoxic brain injury at discharge 02/2024 Delirium precautions   Peripheral Vascular Disease Legs with features c/w peripheral vascular disease, diminished DP pulses Would benefit from outpatient ABI's   HFrEF Euvolemic at this time Continue home medications   Hx Atrial Flutter Not on any anticoagulation at this time EKG with NSR Continue amiodarone   Hypertension Noted, adjust meds gradually after med rec complete   Tooth pain/Infection - Added: Augmentin  along with Orajel BP -better today no longer hypotensive-Will continue reduced dose of metoprolol  12.5 bid.   4. Discharge Planning:   -- Social work and case management to assist with discharge planning and identification of hospital follow-up needs prior to discharge  -- Estimated LOS: 3-4 days  Millie JONELLE Manners, MD 09/13/2024, 1:02  PM

## 2024-09-13 NOTE — Progress Notes (Signed)
" °   09/13/24 0855  Psych Admission Type (Psych Patients Only)  Admission Status Involuntary  Psychosocial Assessment  Patient Complaints None  Eye Contact Other (Comment) (WDL)  Facial Expression Other (Comment) (WDL)  Affect Appropriate to circumstance  Speech Tangential;Soft  Interaction Assertive  Motor Activity Slow  Appearance/Hygiene Layered clothes;In scrubs  Behavior Characteristics Cooperative;Appropriate to situation;Calm  Mood Pleasant  Thought Process  Coherency Tangential  Content WDL  Delusions None reported or observed  Perception WDL  Hallucination None reported or observed  Judgment Limited  Confusion Mild  Danger to Self  Current suicidal ideation? Denies  Agreement Not to Harm Self Yes  Description of Agreement verbal  Danger to Others  Danger to Others None reported or observed   D- Patient alert and oriented x 2 pt presents with a pleasant mood and affect. Denies SI, HI, AVH, and pain.   A- Scheduled medications administered to patient, per MD orders. Support and encouragement provided.  Routine safety checks conducted every 15 minutes.  Patient informed to notify staff with problems or concerns.  R- No adverse drug reactions noted. Patient contracts for safety at this time. Patient compliant with medications and treatment plan. Patient receptive, calm, and cooperative. Patient interacts well with others on the unit.  Patient remains safe at this time.    Landy Dunnavant S.,RN "

## 2024-09-13 NOTE — Plan of Care (Signed)
" °  Problem: Coping: Goal: Ability to verbalize frustrations and anger appropriately will improve Outcome: Progressing   Problem: Education: Goal: Emotional status will improve Outcome: Progressing   Problem: Education: Goal: Knowledge of [place] [facility] Education information/materials will improve Outcome: Not Progressing Goal: Mental status will improve Outcome: Not Progressing Goal: Verbalization of understanding the information provided will improve Outcome: Not Progressing   "

## 2024-09-13 NOTE — Group Note (Signed)
 Date:  09/13/2024 Time:  3:45 PM  Group Topic/Focus:  Self Care:   The focus of this group is to help patients understand the importance of self-care in order to improve or restore emotional, physical, spiritual, interpersonal, and financial health.    Participation Level:  Did Not Attend   Larrie Leita BRAVO 09/13/2024, 3:45 PM

## 2024-09-13 NOTE — Group Note (Signed)
 Date:  09/13/2024 Time:  10:39 AM  Group Topic/Focus:  Rediscovering Joy:   The focus of this group is to explore various ways to relieve stress in a positive manner.    Participation Level:  Active  Participation Quality:  Appropriate and Attentive  Affect:  Appropriate  Cognitive:  Alert  Insight: Appropriate  Engagement in Group:  Engaged  Modes of Intervention:  Activity and Discussion  Additional Comments:     Maglione,Darean Rote E 09/13/2024, 10:39 AM

## 2024-09-14 DIAGNOSIS — F200 Paranoid schizophrenia: Secondary | ICD-10-CM | POA: Diagnosis not present

## 2024-09-14 DIAGNOSIS — F03918 Unspecified dementia, unspecified severity, with other behavioral disturbance: Secondary | ICD-10-CM | POA: Diagnosis not present

## 2024-09-14 NOTE — Group Note (Signed)
 Physical/Occupational Therapy Group Note  Group Topic: Yoga  Group Date: 09/14/2024 Start Time: 1300 End Time: 1325 Facilitators: Caelyn Route, Alm Hamilton, PT   Group Description: Group participated with series of yoga poses, designed to emphasize functional sitting balance, core stability, generalized flexibility and overall posture.  Incorporated deep breathing techniques with poses, working to promote relaxation, mindfulness and focus with targeted activities.  Discussed benefits of yoga in improving mood and self-esteem, reducing stress and anxiety, and promoting functional strength, balance and core stability for each participant.  Discussed ways to integrate into each participants daily routine.  Provided handout with written and pictorial descriptions of included yoga movements to be utilized as appropriate outside of group time.  Therapeutic Goal(s):  Demonstrate safe ability to participate with yoga poses during group activity. Identify one benefit of participation with yoga poses as part of each participants exercise/movement routine. Identify 1-2 individual poses that participant feels most beneficial to his/her needs and that he/she can easily replicate outside of group.  Individual Participation: Pt participated minimally during the session, primarily during the first few minutes, but then was mostly asleep on and off for the remainder of the session with little participation.    Participation Level: Minimal   Participation Quality: Minimal Cues   Behavior: Appropriate   Speech/Thought Process: Very little verbal communication during the session    Affect/Mood: Flat   Insight: Limited   Judgement: Impaired and Limited   Modes of Intervention: Activity, Discussion, and Education  Patient Response to Interventions:  Disengaged, sleeping during most of the session    Plan: Continue to engage patient in PT/OT groups 1 - 2x/week.  CHARM Hamilton Bertin PT, DPT 09/14/24, 2:17  PM

## 2024-09-14 NOTE — Group Note (Signed)
 Recreation Therapy Group Note   Group Topic:Coping Skills  Group Date: 09/14/2024 Start Time: 1400 End Time: 1435 Facilitators: Celestia Jeoffrey BRAVO, LRT, CTRS Location: Dayroom  Group Description: Meditation. LRT and patients discussed what they know about meditation and mindfulness. LRT played a Deep Breathing Meditation exercise script for patients to follow along to. LRT and patients discussed how meditation and deep breathing can be used as a coping skill post--discharge to help manage symptoms of stress.   Goal Area(s) Addressed: Patient will practice using relaxation technique. Patient will identify a new coping skill.  Patient will follow multistep directions to reduce anxiety and stress.   Affect/Mood: N/A   Participation Level: Asleep    Clinical Observations/Individualized Feedback: Patient was asleep duration of session.   Plan: Continue to engage patient in RT group sessions 2-3x/week.   Jeoffrey BRAVO Celestia, LRT, CTRS 09/14/2024 5:04 PM

## 2024-09-14 NOTE — Plan of Care (Signed)
" °  Problem: Education: Goal: Knowledge of [place] [facility] Education information/materials will improve Outcome: Progressing   Problem: Coping: Goal: Ability to verbalize frustrations and anger appropriately will improve Outcome: Progressing   Problem: Education: Goal: Knowledge of [place] [facility] Education information/materials will improve Outcome: Progressing Goal: Emotional status will improve Outcome: Progressing Goal: Mental status will improve Outcome: Progressing Goal: Verbalization of understanding the information provided will improve Outcome: Progressing   "

## 2024-09-14 NOTE — Group Note (Signed)
 Date:  09/14/2024 Time:  8:58 PM  Group Topic/Focus:  Personal Choices and Values:   The focus of this group is to help patients assess and explore the importance of values in their lives, how their values affect their decisions, how they express their values and what opposes their expression.    Participation Level:  Active  Participation Quality:  Appropriate  Affect:  Appropriate  Cognitive:  Appropriate  Insight: Appropriate  Engagement in Group:  Engaged  Modes of Intervention:  Discussion  Additional Comments:    Miguel [name] 09/14/2024, 8:58 PM

## 2024-09-14 NOTE — Progress Notes (Signed)
 Patient pleasant and responsive. Accepted medications without issue. Denies SI/HI/AVH. Wound care. Ongoing monitoring continues. Patient spent about half of his day in room and dayroom.   09/14/24 1100  Psych Admission Type (Psych Patients Only)  Admission Status Involuntary  Psychosocial Assessment  Patient Complaints None  Eye Contact Fair  Facial Expression Animated  Affect Appropriate to circumstance  Speech Slow;Tangential  Interaction Assertive  Motor Activity Slow  Appearance/Hygiene Unremarkable  Behavior Characteristics Cooperative  Mood Pleasant  Thought Process  Coherency Tangential  Content WDL  Delusions None reported or observed  Perception WDL  Hallucination None reported or observed  Judgment Limited  Confusion Mild  Danger to Self  Current suicidal ideation? Denies  Danger to Others  Danger to Others None reported or observed

## 2024-09-14 NOTE — Plan of Care (Signed)
  Problem: Education: Goal: Knowledge of [place] [facility] Education information/materials will improve Outcome: Progressing   Problem: Activity: Goal: Interest or engagement in activities will improve Outcome: Progressing   Problem: Coping: Goal: Ability to verbalize frustrations and anger appropriately will improve Outcome: Progressing   Problem: Education: Goal: Knowledge of  General Education information/materials will improve Outcome: Progressing Goal: Emotional status will improve Outcome: Progressing Goal: Mental status will improve Outcome: Progressing Goal: Verbalization of understanding the information provided will improve Outcome: Progressing   Problem: Activity: Goal: Interest or engagement in activities will improve Outcome: Progressing Goal: Sleeping patterns will improve Outcome: Progressing   Problem: Coping: Goal: Ability to verbalize frustrations and anger appropriately will improve Outcome: Progressing Goal: Ability to demonstrate self-control will improve Outcome: Progressing   Problem: Health Behavior/Discharge Planning: Goal: Identification of resources available to assist in meeting health care needs will improve Outcome: Progressing Goal: Compliance with treatment plan for underlying cause of condition will improve Outcome: Progressing   Problem: Physical Regulation: Goal: Ability to maintain clinical measurements within normal limits will improve Outcome: Progressing   Problem: Safety: Goal: Periods of time without injury will increase Outcome: Progressing   Problem: Coping: Goal: Ability to adjust to condition or change in health will improve Outcome: Progressing   Problem: Health Behavior/Discharge Planning: Goal: Ability to identify and utilize available resources and services will improve Outcome: Progressing

## 2024-09-14 NOTE — Group Note (Signed)
 Date:  09/14/2024 Time:  11:18 AM  Group Topic/Focus:  Personal Choices and Values:   The focus of this group is to help patients assess and explore the importance of values in their lives, how their values affect their decisions, how they express their values and what opposes their expression.    Participation Level:  Active  Participation Quality:  Appropriate  Affect:  Appropriate  Cognitive:  Appropriate  Insight: Appropriate  Engagement in Group:  Engaged  Modes of Intervention:  Activity  Additional Comments:    Camellia HERO Seraphina Mitchner 09/14/2024, 11:18 AM

## 2024-09-14 NOTE — Plan of Care (Signed)
  Problem: Activity: Goal: Interest or engagement in activities will improve Outcome: Progressing   Problem: Safety: Goal: Periods of time without injury will increase Outcome: Progressing

## 2024-09-14 NOTE — Progress Notes (Signed)
 SPIRITUAL CARE AND COUNSELING CONSULT NOTE   VISIT SUMMARY Chaplain provided spiritual/emotional support to Miguel [name] while rounding on unit. Chaplain consulted with nurse team.   SPIRITUAL ENCOUNTER                                                                                                                                                                      Type of Visit: Initial Care provided to:: Patient Conversation partners present during encounter: Other (comment) (patients) Referral source: Chaplain assessment Reason for visit: Routine spiritual support OnCall Visit: No   SPIRITUAL FRAMEWORK  Presenting Themes: Goals in life/care, Values and beliefs, Community and relationships Community/Connection: Family, Faith community Patient Stress Factors: Health changes Family Stress Factors: None identified   GOALS   Self/Personal Goals: healing Clinical Care Goals: healing   INTERVENTIONS   Spiritual Care Interventions Made: Compassionate presence, Established relationship of care and support, Reflective listening    INTERVENTION OUTCOMES   Outcomes: Awareness around self/spiritual resourses, Awareness of support  Chaplain provided compassionate presence, reflective listening, and meaning oriented conversation to elicit Miguel [name]'s feelings about health status, treatment plan, faith background, family support, and source of strength.    SPIRITUAL CARE PLAN     Chaplain will continue to follow.   If immediate needs arise, please contact [facility] 24 hour on call [phone]   Miguel [name], Chaplain  09/14/2024 4:47 PM

## 2024-09-14 NOTE — Progress Notes (Signed)
 [facility] MD Progress Note  09/14/2024 12:58 PM Miguel [name]  MRN:  [id number]  Beau [place]burg. Hoglund is a 74 year old male with a past psychiatric history significant for schizophrenia who presents under involuntary commitment initiated by family due to medication non-adherence, increasing paranoia, and behavioral disorganization. 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 Brother reports multiple episodes of patient whispering through the night, stating people were talking about him and planning to harm him. Patient was advised by his ACT Team to come to the ED; he refused, prompting the family to pursue IVC.Patient is admitted to [facility] unit with Q15 min safety monitoring. Multidisciplinary team approach is offered. Medication management; group/milieu therapy is offered.   Subjective:  Chart reviewed, case discussed in multidisciplinary meeting, patient seen during rounds.   Patient is noted to be resting in his bed.  He remains confused and disorganized in his thought process.  He reports he did hear from his family and then makes a statement that one of them is dead .  He is unable to give further details.  He remains paranoid about something bad happening to his family.  He is calm and cooperative on the unit.  He is noted to be interacting well with other patients and taking his medications with no reported problems or side effects.  He is not endorsing SI/HI/plan.  He is not endorsing hallucinations  Past Psychiatric History: see h&P Family History:  Family History  Family  history unknown: Yes   Social History:  Social History   Substance and Sexual Activity  Alcohol Use No     Social History   Substance and Sexual Activity  Drug Use No    Social History   Socioeconomic History   Marital status: Divorced    Spouse name: Not on file   Number of children: Not on file   Years of education: Not on file   Highest education level: Not on file  Occupational History   Not on file  Tobacco Use   Smoking status: Every Day    Current packs/day: 0.50    Average packs/day: 0.5 packs/day for 46.0 years (23.0 ttl pk-yrs)    Types: Cigarettes    Start date: 25   Smokeless tobacco: Never  Vaping Use   Vaping status: Never Used  Substance and Sexual Activity   Alcohol use: No   Drug use: No   Sexual activity: Not on file  Other Topics Concern   Not on file  Social History Narrative   Not on file   Social Drivers of Health   Tobacco Use: High Risk (07/18/2024)   Patient History    Smoking Tobacco Use: Every Day    Smokeless Tobacco Use: Never    Passive Exposure: Not on file  Financial Resource Strain: Not on file  Food Insecurity: Patient Unable To Answer (07/18/2024)   Epic    Worried About Programme Researcher, Broadcasting/film/video in the Last Year: Patient unable to answer    Ran Out of Food in the Last Year: Patient unable to answer  Transportation Needs: Unmet Transportation Needs (07/18/2024)   Epic    Lack of Transportation (Medical): Yes    Lack of Transportation (Non-Medical):  Yes  Physical Activity: Not on file  Stress: Not on file  Social Connections: Patient Unable To Answer (07/18/2024)   Social Connection and Isolation Panel    Frequency of Communication with Friends and Family: Patient unable to answer    Frequency of Social Gatherings with Friends and Family: Patient unable to answer    Attends Religious Services: Patient unable to answer    Active Member of Clubs or Organizations: Patient unable to answer    Attends Banker  Meetings: Patient unable to answer    Marital Status: Patient unable to answer  Depression (PHQ2-9): Not on file  Alcohol Screen: Low Risk (07/18/2024)   Alcohol Screen    Last Alcohol Screening Score (AUDIT): 0  Housing: Unknown (07/18/2024)   Epic    Unable to Pay for Housing in the Last Year: Patient unable to answer    Number of Times Moved in the Last Year: 0    Homeless in the Last Year: Patient declined  Utilities: Patient Unable To Answer (07/18/2024)   Epic    Threatened with loss of utilities: Patient unable to answer  Health Literacy: Not on file   Past Medical History:  Past Medical History:  Diagnosis Date   Biventricular failure (HCC)    CVA (cerebral vascular accident) (HCC)    Polysubstance use disorder    Schizophrenia (HCC)     Past Surgical History:  Procedure Laterality Date   APPENDECTOMY     TRACHEOSTOMY TUBE PLACEMENT N/A 03/04/2024   Procedure: CREATION, TRACHEOSTOMY;  Surgeon: [name] Massie RAMAN, MD;  Location: [facility] ORS;  Service: ENT;  Laterality: N/A;    Current Medications: Current Facility-Administered Medications  Medication Dose Route Frequency Provider Last Rate Last Admin   acetaminophen  (TYLENOL ) tablet 650 mg  650 mg Oral Q6H PRN Hampton, Tracie B, NP   650 mg at 09/07/24 2100   alum & mag hydroxide-simeth (MAALOX/MYLANTA) 200-200-20 MG/5ML suspension 30 mL  30 mL Oral Q4H PRN Hampton, Tracie B, NP   30 mL at 08/05/24 0306   amiodarone  (PACERONE ) tablet 400 mg  400 mg Oral Daily [name], [name], MD   400 mg at 09/13/24 1056   ARIPiprazole  ER (ABILIFY  MAINTENA) injection 400 mg  400 mg Intramuscular Q28 days Reubin Bushnell, MD   400 mg at 08/24/24 9065   benzocaine  (ORAJEL) 10 % mucosal gel   Mouth/Throat TID PRN [name], [name], MD       divalproex  (DEPAKOTE  ER) 24 hr tablet 1,000 mg  1,000 mg Oral QHS Doneshia Hill, MD   1,000 mg at 09/13/24 1947   docusate sodium  (COLACE) capsule 100 mg  100 mg Oral Daily Estoria Geary, MD   100 mg at  09/14/24 0919   feeding supplement (ENSURE PLUS HIGH PROTEIN) liquid 237 mL  237 mL Oral TID BM [name], [name], MD   237 mL at 09/14/24 0920   hydrOXYzine  (ATARAX ) tablet 25 mg  25 mg Oral TID PRN Hampton, Tracie B, NP   25 mg at 09/11/24 2107   magnesium  hydroxide (MILK OF MAGNESIA) suspension 30 mL  30 mL Oral Daily PRN Hampton, Tracie B, NP   30 mL at 08/08/24 2233   memantine  (NAMENDA ) tablet 10 mg  10 mg Oral Daily Christian Borgerding, MD   10 mg at 09/14/24 9080   metoprolol  tartrate (LOPRESSOR ) tablet 12.5 mg  12.5 mg Oral BID [name], [name], MD   12.5 mg at 09/13/24 2113   naproxen  (NAPROSYN ) tablet 250 mg  250 mg Oral BID WC Ames Hoban, MD   250 mg at 09/14/24 9187   OLANZapine  (ZYPREXA ) injection 5 mg  5 mg Intramuscular TID PRN Hampton, Tracie B, NP       OLANZapine  zydis (ZYPREXA ) disintegrating tablet 5 mg  5 mg Oral TID PRN Hampton, Tracie B, NP   5 mg at 08/04/24 1129   oxyCODONE  (Oxy IR/ROXICODONE ) immediate release tablet 5 mg  5 mg Oral Q4H PRN Dorinda Homans T, MD   5 mg at 09/01/24 2102   pantoprazole  (PROTONIX ) EC tablet 40 mg  40 mg Oral Daily [name], [name], MD   40 mg at 09/14/24 0919   sodium chloride  (OCEAN) 0.65 % nasal spray 1 spray  1 spray Each Nare PRN Donnelly Mellow, MD   1 spray at 08/18/24 1459   traZODone  (DESYREL ) tablet 50 mg  50 mg Oral QHS PRN Hampton, Tracie B, NP   50 mg at 09/13/24 2113    Lab Results:  No results found for this or any previous visit (from the past 48 hours).     Blood Alcohol level:  Lab Results  Component Value Date   [facility] <15 07/17/2024   ETH <15 02/20/2024    Metabolic Disorder Labs: Lab Results  Component Value Date   HGBA1C 5.4 08/12/2024   MPG 108.28 08/12/2024   MPG 119.76 07/14/2018   No results found for: PROLACTIN Lab Results  Component Value Date   CHOL 139 08/12/2024   TRIG 73 08/12/2024   HDL 52 08/12/2024   CHOLHDL 2.7 08/12/2024   VLDL 15 08/12/2024   LDLCALC 72 08/12/2024   LDLCALC 119  (H) 07/13/2018    Physical Findings: AIMS:  , ,  ,  ,    CIWA:    COWS:      Psychiatric Specialty Exam:  Presentation  General Appearance: Appropriate for Environment  Eye Contact:Fleeting  Speech:Normal Rate  Speech Volume:Decreased    Mood and Affect  Mood:Anxious  Affect:Flat   Thought Process  Thought Processes:circumstantial Orientation:Partial  Thought Content:discharge focused  Hallucinations:denies  Ideas of Reference:Delusions At baseline Suicidal Thoughts:denies  Homicidal Thoughts:denies   Sensorium  Memory:Immediate Poor; Recent Poor; Remote Poor  Judgment:Impaired  Insight:None   Executive Functions  Concentration:Poor  Attention Span:Poor  Recall:Poor  Fund of Knowledge:Poor  Language:Fair   Psychomotor Activity  Psychomotor Activity:No data recorded  Musculoskeletal: Strength & Muscle Tone: within normal limits Gait & Station: normal Assets  Assets:Communication Skills; Desire for Improvement; Social Support    Physical Exam: Physical Exam Vitals and nursing note reviewed.    ROS Blood pressure (!) 129/53, pulse 62, temperature (!) 97.1 F (36.2 C), resp. rate 14, height 6' 1 (1.854 m), weight 81.2 kg, SpO2 96%. Body mass index is 23.62 kg/m.  Diagnosis: Active Problems:   Schizophrenia, paranoid (HCC)   Dementia with behavioral disturbance (HCC)   PLAN: Safety and Monitoring:  -- Involuntary admission to inpatient psychiatric unit for safety, stabilization and treatment  -- Daily contact with patient to assess and evaluate symptoms and progress in treatment  -- Patient's case to be discussed in multi-disciplinary team meeting  -- Observation Level : q15 minute checks  -- Vital signs:  q12 hours  -- Precautions: suicide, elopement, and assault -- Encouraged patient to participate in unit milieu and in scheduled group therapies  2. Psychiatric Treatment:  Scheduled Medications:  Discontinued oral  Abilify  LAY Abilify  Maintena 400 mg q4 weeks given on 07/27/24, 08/24/2024  Cogentin  1mg  one dose given  for EPS Depakote  ER increased to 1000 mg to help with mood stabilization  Depakote  levels 09/02/24: 57  -- The risks/benefits/side-effects/alternatives to this medication were discussed in detail with the patient and time was given for questions. The patient consents to medication trial.  3. Medical Issues Being Addressed:  [facility] consulted for wound care management   Assessment and Plan:   Sacral Decubitus Ulcer Full thickness decubitus ulcer in setting of recent extended hospitalization, discharged from [facility] 14[facility] Wound looks pretty good overall WOC on board we appreciate input   Cognitive Deficit Concern for anoxic brain injury at discharge 02/2024 Delirium precautions   Peripheral Vascular Disease Legs with features c/w peripheral vascular disease, diminished DP pulses Would benefit from outpatient ABI's   HFrEF Euvolemic at this time Continue home medications   Hx Atrial Flutter Not on any anticoagulation at this time EKG with NSR Continue amiodarone   Hypertension Noted, adjust meds gradually after med rec complete   Tooth pain/Infection - Added: Augmentin  along with Orajel BP -better today no longer hypotensive-Will continue reduced dose of metoprolol  12.5 bid.   4. Discharge Planning:   -- Social work and case management to assist with discharge planning and identification of hospital follow-up needs prior to discharge  -- Estimated LOS: 3-4 days  Sanad Fearnow, MD 09/14/2024, 12:58 PM

## 2024-09-15 DIAGNOSIS — F200 Paranoid schizophrenia: Secondary | ICD-10-CM | POA: Diagnosis not present

## 2024-09-15 DIAGNOSIS — F03918 Unspecified dementia, unspecified severity, with other behavioral disturbance: Secondary | ICD-10-CM | POA: Diagnosis not present

## 2024-09-15 NOTE — [facility] Counselor (Signed)
 CSW attempted to contact the patient's brother, [place],  [phone].  Voicemail box was full and CSW was unable to leave any voicemail messages.   Sherryle Margo, MSW, LCSW 09/15/2024 10:37 AM

## 2024-09-15 NOTE — Progress Notes (Signed)
" °   09/15/24 1100  Psych Admission Type (Psych Patients Only)  Admission Status Involuntary  Psychosocial Assessment  Patient Complaints Irritability  Eye Contact Fair  Facial Expression Animated  Affect Appropriate to circumstance  Speech Soft  Interaction Assertive  Motor Activity Slow  Appearance/Hygiene Unremarkable  Behavior Characteristics Cooperative  Mood Pleasant  Thought Process  Coherency Tangential  Content Blaming others  Delusions Paranoid  Perception WDL  Hallucination None reported or observed  Judgment Impaired  Confusion Mild  Danger to Self  Current suicidal ideation? Denies  Danger to Others  Danger to Others None reported or observed    "

## 2024-09-15 NOTE — Progress Notes (Signed)
" °   09/14/24 2000  Psych Admission Type (Psych Patients Only)  Admission Status Involuntary  Psychosocial Assessment  Patient Complaints Worrying;Irritability  Eye Contact Fair  Facial Expression Animated  Affect Appropriate to circumstance;Preoccupied  Speech Slow;Tangential  Interaction Assertive  Motor Activity Slow  Appearance/Hygiene Unremarkable  Behavior Characteristics Cooperative;Irritable;Restless  Mood Preoccupied;Irritable  Thought Process  Coherency Tangential  Content Blaming others  Delusions Paranoid  Perception WDL  Hallucination None reported or observed  Judgment Impaired  Confusion Mild  Danger to Self  Current suicidal ideation? Denies  Self-Injurious Behavior No self-injurious ideation or behavior indicators observed or expressed   Agreement Not to Harm Self Yes  Description of Agreement verbal  Danger to Others  Danger to Others None reported or observed   Mood/Behavior:  Pt cooperative intermittently irritable and blaming others going in his room making a mess. Restless over night sitting in chair noted falling asleep pt encouraged to go back to his room.   Psych assessment: Denies SI/HI and AVH.     Interaction / Group attendance:  Present in the milieu. Minimal interaction with peers and staff.  Attended group.   Medication/ PRNs: Compliant with scheduled medications. Required PRNs Trazodone  for sleep and noted decrease effective. PRN atarax  for anxiety noted to have decrease effect.    Pain: Denies   15 min checks in place for safety. "

## 2024-09-15 NOTE — Group Note (Signed)
 Date:  09/15/2024 Time:  4:12 PM  Group Topic/Focus:  Managing Feelings:   The focus of this group is to identify what feelings patients have difficulty handling and develop a plan to handle them in a healthier way upon discharge.  Meditation is excellent for elderly patients as it improves cognitive health, reduces stress, manages chronic pain, boosts mood, enhances sleep, and increases emotional resilience, helping combat age-related issues like memory loss, anxiety, and loneliness by promoting brain plasticity and a sense of calm, making it a simple, adaptable tool for better overall quality of life.   Participation Level:  Did Not Attend  Miguel [name] 09/15/2024, 4:12 PM

## 2024-09-15 NOTE — BH IP Treatment Plan (Signed)
 Interdisciplinary Treatment and Diagnostic Plan Update  09/15/2024 Time of Session: 2:45PM Miguel [name] MRN: [id number]  Principal Diagnosis: <principal problem not specified>  Secondary Diagnoses: Active Problems:   Schizophrenia, paranoid (HCC)   Dementia with behavioral disturbance (HCC)   Current Medications:  Current Facility-Administered Medications  Medication Dose Route Frequency Provider Last Rate Last Admin   acetaminophen  (TYLENOL ) tablet 650 mg  650 mg Oral Q6H PRN Hampton, Tracie B, NP   650 mg at 09/07/24 2100   alum & mag hydroxide-simeth (MAALOX/MYLANTA) 200-200-20 MG/5ML suspension 30 mL  30 mL Oral Q4H PRN Hampton, Tracie B, NP   30 mL at 08/05/24 0306   amiodarone  (PACERONE ) tablet 400 mg  400 mg Oral Daily [name], [name], MD   400 mg at 09/15/24 9063   ARIPiprazole  ER (ABILIFY  MAINTENA) injection 400 mg  400 mg Intramuscular Q28 days [name], [name], MD   400 mg at 08/24/24 9065   benzocaine  (ORAJEL) 10 % mucosal gel   Mouth/Throat TID PRN [name], [name], MD       divalproex  (DEPAKOTE  ER) 24 hr tablet 1,000 mg  1,000 mg Oral QHS [name], [name], MD   1,000 mg at 09/14/24 2124   docusate sodium  (COLACE) capsule 100 mg  100 mg Oral Daily [name], [name], MD   100 mg at 09/15/24 9060   feeding supplement (ENSURE PLUS HIGH PROTEIN) liquid 237 mL  237 mL Oral TID BM [name], [name], MD   237 mL at 09/15/24 1515   hydrOXYzine  (ATARAX ) tablet 25 mg  25 mg Oral TID PRN Hampton, Tracie B, NP   25 mg at 09/14/24 2125   magnesium  hydroxide (MILK OF MAGNESIA) suspension 30 mL  30 mL Oral Daily PRN Hampton, Tracie B, NP   30 mL at 08/08/24 2233   memantine  (NAMENDA ) tablet 10 mg  10 mg Oral Daily [name], [name], MD   10 mg at 09/15/24 9060   metoprolol  tartrate (LOPRESSOR ) tablet 12.5 mg  12.5 mg Oral BID [name], [name], MD   12.5 mg at 09/15/24 9062   naproxen  (NAPROSYN ) tablet 250 mg  250 mg Oral BID WC [name], [name], MD   250 mg at 09/15/24 9063   OLANZapine   (ZYPREXA ) injection 5 mg  5 mg Intramuscular TID PRN Hampton, Tracie B, NP       OLANZapine  zydis (ZYPREXA ) disintegrating tablet 5 mg  5 mg Oral TID PRN Hampton, Tracie B, NP   5 mg at 08/04/24 1129   oxyCODONE  (Oxy IR/ROXICODONE ) immediate release tablet 5 mg  5 mg Oral Q4H PRN [name], [name], MD   5 mg at 09/01/24 2102   pantoprazole  (PROTONIX ) EC tablet 40 mg  40 mg Oral Daily [name], [name], MD   40 mg at 09/15/24 9060   sodium chloride  (OCEAN) 0.65 % nasal spray 1 spray  1 spray Each Nare PRN [name], [name], MD   1 spray at 08/18/24 1459   traZODone  (DESYREL ) tablet 50 mg  50 mg Oral QHS PRN Hampton, Tracie B, NP   50 mg at 09/14/24 2124   PTA Medications: Medications Prior to Admission  Medication Sig Dispense Refill Last Dose/Taking   amiodarone  (PACERONE ) 400 MG tablet Place 1 tablet (400 mg total) into feeding tube daily.      ARIPiprazole  (ABILIFY ) 30 MG tablet Place 1 tablet (30 mg total) into feeding tube at bedtime.      bisacodyl  (DULCOLAX) 10 MG suppository Place 1 suppository (10 mg total) rectally daily as needed for  moderate constipation or severe constipation.      busPIRone  (BUSPAR ) 5 MG tablet Take 5 mg by mouth 3 (three) times daily.      Chlorhexidine  Gluconate Cloth 2 % PADS Apply 6 each topically daily.      digoxin  (LANOXIN ) 0.125 MG tablet 1 tablet (0.125 mg total) by Per NG tube route daily. (Patient taking differently: Take 0.125 mg by mouth every other day.)      docusate (COLACE) 50 MG/5ML liquid Place 10 mLs (100 mg total) into feeding tube 2 (two) times daily.      metoprolol  tartrate (LOPRESSOR ) 25 MG tablet Take 25 mg by mouth 2 (two) times daily.      pantoprazole  (PROTONIX ) 40 MG tablet Take 40 mg by mouth daily.      traMADol  (ULTRAM ) 50 MG tablet Take 50 mg by mouth 2 (two) times daily.       Patient Stressors: Educational concerns   Health problems   Medication change or noncompliance   Other: Stated transitional housing concerns    Patient  Strengths: Manufacturing systems engineer  Religious Affiliation   Treatment Modalities: Medication Management, Group therapy, Case management,  1 to 1 session with clinician, Psychoeducation, Recreational therapy.   Physician Treatment Plan for Primary Diagnosis: <principal problem not specified> Long Term Goal(s):     Short Term Goals:    Medication Management: Evaluate patient's response, side effects, and tolerance of medication regimen.  Therapeutic Interventions: 1 to 1 sessions, Unit Group sessions and Medication administration.  Evaluation of Outcomes: Progressing  Physician Treatment Plan for Secondary Diagnosis: Active Problems:   Schizophrenia, paranoid (HCC)   Dementia with behavioral disturbance (HCC)  Long Term Goal(s):     Short Term Goals:       Medication Management: Evaluate patient's response, side effects, and tolerance of medication regimen.  Therapeutic Interventions: 1 to 1 sessions, Unit Group sessions and Medication administration.  Evaluation of Outcomes: Progressing   RN Treatment Plan for Primary Diagnosis: <principal problem not specified> Long Term Goal(s): Knowledge of disease and therapeutic regimen to maintain health will improve  Short Term Goals: Ability to verbalize frustration and anger appropriately will improve, Ability to demonstrate self-control, Ability to participate in decision making will improve, Ability to verbalize feelings will improve, Ability to disclose and discuss suicidal ideas, Ability to identify and develop effective coping behaviors will improve, and Compliance with prescribed medications will improve  Medication Management: RN will administer medications as ordered by provider, will assess and evaluate patient's response and provide education to patient for prescribed medication. RN will report any adverse and/or side effects to prescribing provider.  Therapeutic Interventions: 1 on 1 counseling sessions, Psychoeducation,  Medication administration, Evaluate responses to treatment, Monitor vital signs and CBGs as ordered, Perform/monitor CIWA, COWS, AIMS and Fall Risk screenings as ordered, Perform wound care treatments as ordered.  Evaluation of Outcomes: Progressing   LCSW Treatment Plan for Primary Diagnosis: <principal problem not specified> Long Term Goal(s): Safe transition to appropriate next level of care at discharge, Engage patient in therapeutic group addressing interpersonal concerns.  Short Term Goals: Engage patient in aftercare planning with referrals and resources, Increase social support, Increase ability to appropriately verbalize feelings, Increase emotional regulation, Facilitate acceptance of mental health diagnosis and concerns, and Increase skills for wellness and recovery  Therapeutic Interventions: Assess for all discharge needs, 1 to 1 time with Social worker, Explore available resources and support systems, Assess for adequacy in community support network, Educate family and significant other(s) on suicide  prevention, Complete Psychosocial Assessment, Interpersonal group therapy.  Evaluation of Outcomes: Progressing   Progress in Treatment: Attending groups: Yes. Participating in groups: Yes. Taking medication as prescribed: Yes. Toleration medication: Yes. Family/Significant other contact made: Yes, individual(s) contacted:  SPE completed with the patient's brother Patient understands diagnosis: No. Discussing patient identified problems/goals with staff: Yes. Medical problems stabilized or resolved: Yes. Denies suicidal/homicidal ideation: Yes. Issues/concerns per patient self-inventory: No. Other: none  New problem(s) identified: No, Describe:  None Update 07/26/24: No changes at this time Update 08/01/24: No changes at this time  Update 08/06/24: No changes at this time 08/11/24 Update: No changes at this time.  Update 08/16/24: No changes at this time. Update 12/262025:  No  changes at this time. Update 08/26/2024:  No changes at this time. Update 08/31/2024:  No changes at this time. Update: 09/05/2024  Update 09/10/2024:  No changes at this time. Update 09/15/2024: No changes at this time.   New Short Term/Long Term Goal(s):detox, elimination of symptoms of psychosis, medication management for mood stabilization; elimination of SI thoughts; development of comprehensive mental wellness/sobriety plan.  Update 07/26/24: No changes at this time Update 08/01/24: No changes at this time Update 08/06/24: No changes at this time  10/13/23 Update: No changes at this time. Update 08/16/24: No changes at this time.  Update 12/262025:  No changes at this time. Update 08/26/2024:  No changes at this time. Update 08/31/2024:  No changes at this time. Update:09/05/2024 no changes  Update 09/10/2024:  No changes at this time. Update 09/15/2024: No changes at this time.   Patient Goals:  detox, elimination of symptoms of psychosis, medication management for mood stabilization; elimination of SI thoughts; development of comprehensive mental wellness/sobriety plan.  Update 07/26/24: No changes at this time Update 08/01/24: No changes at this time Update 08/06/24: No changes at this time  10/13/23 Update: No changes at this time. Update 08/16/24: No changes at this time.  Update 12/262025:  No changes at this time. Update 08/26/2024:  No changes at this time. Update 08/31/2024:  No changes at this time. 09/05/2024: no changes Update 09/10/2024:  No changes at this time.  Update 09/15/2024: No changes at this time.   Discharge Plan or Barriers: CSW to assist with the development of appropriate discharge plan.  Update 07/26/24: No changes at this time Update 08/01/24: APS Report made for pt, screened it. Update 08/06/24: FL2 sent to [facility]. Pt is not placement. [facility] and APS to assist with placement. 08/11/24 Update: CSW to continue to support [facility] in working toward placement for patient.  Update 08/16/24: No changes at this time. Update 12/262025:  No changes at this time. Update 08/26/2024:  No changes at this time. Update 08/31/2024:  Pt awarded interim guardianship at this time, guardianship court date scheduled for 09/07/2024.  [facility] report no updates on placement at this time. 09/05/2024 no changes  Update 09/10/2024:  No changes at this time.  Update 09/15/2024: Placement continues to be a barrier for this patient.    Reason for Continuation of Hospitalization: Aggression Anxiety Delusions  Depression Hallucinations   Estimated Length of Stay:TBD  Update 09/10/2024:  TBD Update 09/15/2024: No changes at this time.  Last 3 [place] Suicide Severity Risk Score: Flowsheet Row Admission (Current) from 07/18/2024 in [facility] from 07/17/2024 in [facility] at [facility] ED to Hosp-Admission (Discharged) from 02/20/2024 in [facility] ICU[facility]CCU  C-SSRS RISK CATEGORY No Risk No  Risk No Risk    Last PHQ 2/9 Scores:     No data to display          Scribe for Treatment Team: Sherryle JINNY Paola KEN 09/15/2024 3:47 PM

## 2024-09-15 NOTE — Plan of Care (Signed)
   Problem: Education: Goal: Knowledge of [facility] Education information/materials will improve Outcome: Progressing Goal: Emotional status will improve Outcome: Progressing Goal: Mental status will improve Outcome: Progressing Goal: Verbalization of understanding the information provided will improve Outcome: Progressing

## 2024-09-15 NOTE — Progress Notes (Signed)
 [facility] MD Progress Note  09/15/2024 10:30 PM Miguel [name]  MRN:  [id number]  Miguel [name]. Miguel [name] is a 74 year old male with a past psychiatric history significant for schizophrenia who presents under involuntary commitment initiated by family due to medication non-adherence, increasing paranoia, and behavioral disorganization. 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 Brother reports multiple episodes of patient whispering through the night, stating people were talking about him and planning to harm him. Patient was advised by his ACT Team to come to the ED; he refused, prompting the family to pursue IVC.Patient is admitted to [facility] unit with Q15 min safety monitoring. Multidisciplinary team approach is offered. Medication management; group/milieu therapy is offered.   Subjective:  Chart reviewed, case discussed in multidisciplinary meeting, patient seen during rounds.   Patient is noted to be resting in bed.  He looked more tired but denies any physical complaints.  He denies SI/HI/plan and denies hallucinations.  He remains disorganized in his thought process and continues to believe that his family members died or got killed  Past Psychiatric History: see h&P Family History:  Family History  Family history unknown: Yes   Social History:  Social History   Substance and Sexual Activity  Alcohol Use No     Social History   Substance and Sexual Activity  Drug Use No    Social History   Socioeconomic History   Marital status: Divorced    Spouse name: Not on file    Number of children: Not on file   Years of education: Not on file   Highest education level: Not on file  Occupational History   Not on file  Tobacco Use   Smoking status: Every Day    Current packs/day: 0.50    Average packs/day: 0.5 packs/day for 46.1 years (23.0 ttl pk-yrs)    Types: Cigarettes    Start date: 25   Smokeless tobacco: Never  Vaping Use   Vaping status: Never Used  Substance and Sexual Activity   Alcohol use: No   Drug use: No   Sexual activity: Not on file  Other Topics Concern   Not on file  Social History Narrative   Not on file   Social Drivers of Health   Tobacco Use: High Risk (07/18/2024)   Patient History    Smoking Tobacco Use: Every Day    Smokeless Tobacco Use: Never    Passive Exposure: Not on file  Financial Resource Strain: Not on file  Food Insecurity: Patient Unable To Answer (07/18/2024)   Epic    Worried About Programme Researcher, Broadcasting/film/video in the Last Year: Patient unable to answer    Ran Out of Food in the Last Year: Patient unable to answer  Transportation Needs: Unmet Transportation Needs (07/18/2024)   Epic    Lack of Transportation (Medical): Yes    Lack of Transportation (Non-Medical): Yes  Physical Activity: Not on file  Stress: Not on file  Social Connections: Patient Unable To Answer (07/18/2024)   Social Connection and Isolation Panel    Frequency of Communication with Friends and Family: Patient unable to answer    Frequency of Social Gatherings with Friends and Family: Patient unable to answer  Attends Religious Services: Patient unable to answer    Active Member of Clubs or Organizations: Patient unable to answer    Attends Club or Organization Meetings: Patient unable to answer    Marital Status: Patient unable to answer  Depression (PHQ2-9): Not on file  Alcohol Screen: Low Risk (07/18/2024)   Alcohol Screen    Last Alcohol Screening Score (AUDIT): 0  Housing: Unknown (07/18/2024)   Epic    Unable to Pay for Housing in  the Last Year: Patient unable to answer    Number of Times Moved in the Last Year: 0    Homeless in the Last Year: Patient declined  Utilities: Patient Unable To Answer (07/18/2024)   Epic    Threatened with loss of utilities: Patient unable to answer  Health Literacy: Not on file   Past Medical History:  Past Medical History:  Diagnosis Date   Biventricular failure (HCC)    CVA (cerebral vascular accident) (HCC)    Polysubstance use disorder    Schizophrenia (HCC)     Past Surgical History:  Procedure Laterality Date   APPENDECTOMY     TRACHEOSTOMY TUBE PLACEMENT N/A 03/04/2024   Procedure: CREATION, TRACHEOSTOMY;  Surgeon: [name] Massie RAMAN, MD;  Location: [facility] ORS;  Service: ENT;  Laterality: N/A;    Current Medications: Current Facility-Administered Medications  Medication Dose Route Frequency Provider Last Rate Last Admin   acetaminophen  (TYLENOL ) tablet 650 mg  650 mg Oral Q6H PRN Hampton, Tracie B, NP   650 mg at 09/07/24 2100   alum & mag hydroxide-simeth (MAALOX/MYLANTA) 200-200-20 MG/5ML suspension 30 mL  30 mL Oral Q4H PRN Hampton, Tracie B, NP   30 mL at 08/05/24 0306   amiodarone  (PACERONE ) tablet 400 mg  400 mg Oral Daily [name], [name], MD   400 mg at 09/15/24 9063   ARIPiprazole  ER (ABILIFY  MAINTENA) injection 400 mg  400 mg Intramuscular Q28 days Broox Lonigro, MD   400 mg at 08/24/24 0934   benzocaine  (ORAJEL) 10 % mucosal gel   Mouth/Throat TID PRN [name], [name], MD       divalproex  (DEPAKOTE  ER) 24 hr tablet 1,000 mg  1,000 mg Oral QHS Madisyn Mawhinney, MD   1,000 mg at 09/15/24 2117   docusate sodium  (COLACE) capsule 100 mg  100 mg Oral Daily Magan Winnett, MD   100 mg at 09/15/24 0939   feeding supplement (ENSURE PLUS HIGH PROTEIN) liquid 237 mL  237 mL Oral TID BM [name], [name], MD   237 mL at 09/15/24 2120   hydrOXYzine  (ATARAX ) tablet 25 mg  25 mg Oral TID PRN Hampton, Tracie B, NP   25 mg at 09/15/24 2117   magnesium  hydroxide (MILK OF MAGNESIA)  suspension 30 mL  30 mL Oral Daily PRN Hampton, Tracie B, NP   30 mL at 08/08/24 2233   memantine  (NAMENDA ) tablet 10 mg  10 mg Oral Daily Celes Dedic, MD   10 mg at 09/15/24 9060   metoprolol  tartrate (LOPRESSOR ) tablet 12.5 mg  12.5 mg Oral BID [name], [name], MD   12.5 mg at 09/15/24 2116   naproxen  (NAPROSYN ) tablet 250 mg  250 mg Oral BID WC Persia Lintner, MD   250 mg at 09/15/24 1653   OLANZapine  (ZYPREXA ) injection 5 mg  5 mg Intramuscular TID PRN Hampton, Tracie B, NP       OLANZapine  zydis (ZYPREXA ) disintegrating tablet 5 mg  5 mg Oral TID PRN Hampton, Tracie B, NP   5 mg  at 08/04/24 1129   oxyCODONE  (Oxy IR/ROXICODONE ) immediate release tablet 5 mg  5 mg Oral Q4H PRN Dorinda Homans T, MD   5 mg at 09/01/24 2102   pantoprazole  (PROTONIX ) EC tablet 40 mg  40 mg Oral Daily [name], [name], MD   40 mg at 09/15/24 9060   sodium chloride  (OCEAN) 0.65 % nasal spray 1 spray  1 spray Each Nare PRN Mykhia Danish, MD   1 spray at 08/18/24 1459   traZODone  (DESYREL ) tablet 50 mg  50 mg Oral QHS PRN Hampton, Tracie B, NP   50 mg at 09/15/24 2117    Lab Results:  No results found for this or any previous visit (from the past 48 hours).     Blood Alcohol level:  Lab Results  Component Value Date   [facility] <15 07/17/2024   ETH <15 02/20/2024    Metabolic Disorder Labs: Lab Results  Component Value Date   HGBA1C 5.4 08/12/2024   MPG 108.28 08/12/2024   MPG 119.76 07/14/2018   No results found for: PROLACTIN Lab Results  Component Value Date   CHOL 139 08/12/2024   TRIG 73 08/12/2024   HDL 52 08/12/2024   CHOLHDL 2.7 08/12/2024   VLDL 15 08/12/2024   LDLCALC 72 08/12/2024   LDLCALC 119 (H) 07/13/2018    Physical Findings: AIMS:  , ,  ,  ,    CIWA:    COWS:      Psychiatric Specialty Exam:  Presentation  General Appearance: Appropriate for Environment  Eye Contact:Fleeting  Speech:Normal Rate  Speech Volume:Decreased    Mood and Affect   Mood:Anxious  Affect:Flat   Thought Process  Thought Processes:circumstantial Orientation:Partial  Thought Content:discharge focused  Hallucinations:denies  Ideas of Reference:Delusions At baseline Suicidal Thoughts:denies  Homicidal Thoughts:denies   Sensorium  Memory:Immediate Poor; Recent Poor; Remote Poor  Judgment:Impaired  Insight:None   Executive Functions  Concentration:Poor  Attention Span:Poor  Recall:Poor  Fund of Knowledge:Poor  Language:Fair   Psychomotor Activity  Psychomotor Activity:No data recorded  Musculoskeletal: Strength & Muscle Tone: within normal limits Gait & Station: normal Assets  Assets:Communication Skills; Desire for Improvement; Social Support    Physical Exam: Physical Exam Vitals and nursing note reviewed.    ROS Blood pressure 134/82, pulse 65, temperature 98.1 F (36.7 C), resp. rate 14, height 6' 1 (1.854 m), weight 81.2 kg, SpO2 99%. Body mass index is 23.62 kg/m.  Diagnosis: Active Problems:   Schizophrenia, paranoid (HCC)   Dementia with behavioral disturbance (HCC)   PLAN: Safety and Monitoring:  -- Involuntary admission to inpatient psychiatric unit for safety, stabilization and treatment  -- Daily contact with patient to assess and evaluate symptoms and progress in treatment  -- Patient's case to be discussed in multi-disciplinary team meeting  -- Observation Level : q15 minute checks  -- Vital signs:  q12 hours  -- Precautions: suicide, elopement, and assault -- Encouraged patient to participate in unit milieu and in scheduled group therapies  2. Psychiatric Treatment:  Scheduled Medications:  Discontinued oral Abilify  LAY Abilify  Maintena 400 mg q4 weeks given on 07/27/24, 08/24/2024  Cogentin  1mg  one dose given for EPS Depakote  ER increased to 1000 mg to help with mood stabilization  Depakote  levels 09/02/24: 57  -- The risks/benefits/side-effects/alternatives to this medication were  discussed in detail with the patient and time was given for questions. The patient consents to medication trial.  3. Medical Issues Being Addressed:  [facility] consulted for wound care management   Assessment and  Plan:   Sacral Decubitus Ulcer Full thickness decubitus ulcer in setting of recent extended hospitalization, discharged from [facility] Nov 14th Wound looks pretty good overall WOC on board we appreciate input   Cognitive Deficit Concern for anoxic brain injury at discharge 02/2024 Delirium precautions   Peripheral Vascular Disease Legs with features c/w peripheral vascular disease, diminished DP pulses Would benefit from outpatient ABI's   HFrEF Euvolemic at this time Continue home medications   Hx Atrial Flutter Not on any anticoagulation at this time EKG with NSR Continue amiodarone   Hypertension Noted, adjust meds gradually after med rec complete   Tooth pain/Infection - Added: Augmentin  along with Orajel BP -better today no longer hypotensive-Will continue reduced dose of metoprolol  12.5 bid.   4. Discharge Planning:   -- Social work and case management to assist with discharge planning and identification of hospital follow-up needs prior to discharge  -- Estimated LOS: 3-4 days  Allyn Foil, MD 09/15/2024, 10:30 PM

## 2024-09-15 NOTE — Group Note (Signed)
 Date:  09/15/2024 Time:  11:35 PM  Group Topic/Focus:  Wrap-Up Group:   The focus of this group is to help patients review their daily goal of treatment and discuss progress on daily workbooks.  Coping Skills   Participation Level:  Did Not Attend  Participation Quality:  Inattentive  Affect:  Not Congruent  Cognitive:  Lacking  Insight: None  Engagement in Group:  None  Modes of Intervention:  none  Additional Comments:    Gwendlyn LITTIE Sharps 09/15/2024, 11:35 PM

## 2024-09-15 NOTE — Group Note (Signed)
 LCSW Group Therapy Note    Group Date: 09/15/2024 Start Time: 1300 End Time: 1400   Type of Therapy and Topic: Group Therapy: Body Image  Participation Level:  Did Not Attend  Description of Group:  Patients were educated about body image and asked to think about whether they have a healthy or unhealthy body image. Patients were led in a discussion about factors that contribute to body image, both internal and external. Patients were asked to discuss strengths of the human body outside of appearance, such as being able to fight off diseases and provide stress relief. Lastly, patients were asked to identify one way in which they appreciate their own body outside of appearance.   Therapeutic Goals:   1. Patient will differentiate between a healthy and unhealthy body image. 2. Patient will identify what contributes to body image 3. Patient will discuss the strengths of the human body. 4. Patient will identify a positive attribute of their body outside of physical appearance.  Summary of Patient Progress:  Patient did not attend.   Therapeutic Modalities: Cognitive Behavioral Therapy; Solution-Focused Therapy  Aanyah Loa M Jonta Gastineau, LCSWA 09/15/2024  2:48 PM

## 2024-09-15 NOTE — Group Note (Signed)
 Date:  09/15/2024 Time:  11:24 PM  Group Topic/Focus:  Wrap-Up Group:   The focus of this group is to help patients review their daily goal of treatment and discuss progress on daily workbooks.    Participation Level:  Active  Participation Quality:  Appropriate  Affect:  Appropriate  Cognitive:  Alert  Insight: Appropriate  Engagement in Group:  Engaged  Modes of Intervention:  Discussion  Additional Comments:    Miguel [name] [name] 09/15/2024, 11:24 PM

## 2024-09-15 NOTE — Group Note (Signed)
 Recreation Therapy Group Note   Group Topic:Animal Assisted Therapy   Group Date: 09/15/2024 Start Time: 1030 End Time: 1100 Facilitators: Celestia Jeoffrey BRAVO, LRT, CTRS Location: Dayroom  Group Description: AAA. Animal-Assisted Activity provides opportunities for motivational, educational, therapeutic and/or recreational benefits to enhance quality of life. Selinda and Rollo visited the unit to interact with patients.   Goal Areas Addressed:  Reduced anxiety and stress Improved mood Increased social interaction Enhanced communication skills Reduced loneliness and isolation Improved emotional regulation   Affect/Mood: Appropriate   Participation Level: Active and Engaged   Participation Quality: Independent   Behavior: Appropriate   Speech/Thought Process: Coherent   Insight: Good   Judgement: Good   Modes of Intervention: Activity   Patient Response to Interventions:  Attentive, Engaged, and Interested    Education Outcome:  Acknowledges education   Clinical Observations/Individualized Feedback: Naziah was active in their participation of session activities and group discussion. Pt interacted well with LRT and peers duration of session.    Plan: Continue to engage patient in RT group sessions 2-3x/week.   Jeoffrey BRAVO Celestia, LRT, CTRS 09/15/2024 11:53 AM

## 2024-09-16 DIAGNOSIS — F03918 Unspecified dementia, unspecified severity, with other behavioral disturbance: Secondary | ICD-10-CM | POA: Diagnosis not present

## 2024-09-16 DIAGNOSIS — F200 Paranoid schizophrenia: Secondary | ICD-10-CM | POA: Diagnosis not present

## 2024-09-16 NOTE — Progress Notes (Signed)
 [facility] MD Progress Note  09/16/2024 12:26 PM Miguel [name]  MRN:  [id number]  Miguel [name]. Miguel [name] is a 74 year old male with a past psychiatric history significant for schizophrenia who presents under involuntary commitment initiated by family due to medication non-adherence, increasing paranoia, and behavioral disorganization. 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 Brother reports multiple episodes of patient whispering through the night, stating people were talking about him and planning to harm him. Patient was advised by his ACT Team to come to the ED; he refused, prompting the family to pursue IVC.Patient is admitted to [facility] unit with Q15 min safety monitoring. Multidisciplinary team approach is offered. Medication management; group/milieu therapy is offered.   Subjective:  Chart reviewed, case discussed in multidisciplinary meeting, patient seen during rounds.   Patient is noted to be sitting in the day area.  He remains confused with disorganized thought process.  He continues to talk about different staff members taking his things.  He talks about his family being killed out dead.  He complains about being cold in his room.  She remains fixated on occupied about his life in jail/prison.  Per nursing patient is taking medications with no reported side effects  Past Psychiatric History: see h&P Family History:  Family History  Family history unknown: Yes   Social History:  Social History   Substance and Sexual Activity  Alcohol Use No     Social History    Substance and Sexual Activity  Drug Use No    Social History   Socioeconomic History   Marital status: Divorced    Spouse name: Not on file   Number of children: Not on file   Years of education: Not on file   Highest education level: Not on file  Occupational History   Not on file  Tobacco Use   Smoking status: Every Day    Current packs/day: 0.50    Average packs/day: 0.5 packs/day for 46.1 years (23.0 ttl pk-yrs)    Types: Cigarettes    Start date: 98   Smokeless tobacco: Never  Vaping Use   Vaping status: Never Used  Substance and Sexual Activity   Alcohol use: No   Drug use: No   Sexual activity: Not on file  Other Topics Concern   Not on file  Social History Narrative   Not on file   Social Drivers of Health   Tobacco Use: High Risk (07/18/2024)   Patient History    Smoking Tobacco Use: Every Day    Smokeless Tobacco Use: Never    Passive Exposure: Not on file  Financial Resource Strain: Not on file  Food Insecurity: Patient Unable To Answer (07/18/2024)   Epic    Worried About Programme Researcher, Broadcasting/film/video in the Last Year: Patient unable to answer    Ran Out of Food in the Last Year: Patient unable to answer  Transportation Needs: Unmet Transportation Needs (07/18/2024)   Epic    Lack of Transportation (Medical): Yes    Lack of Transportation (Non-Medical): Yes  Physical Activity: Not on file  Stress: Not on file  Social Connections: Patient Unable To Answer (07/18/2024)   Social Connection and Isolation Panel  Frequency of Communication with Friends and Family: Patient unable to answer    Frequency of Social Gatherings with Friends and Family: Patient unable to answer    Attends Religious Services: Patient unable to answer    Active Member of Clubs or Organizations: Patient unable to answer    Attends Banker Meetings: Patient unable to answer    Marital Status: Patient unable to answer  Depression (PHQ2-9): Not on file  Alcohol Screen:  Low Risk (07/18/2024)   Alcohol Screen    Last Alcohol Screening Score (AUDIT): 0  Housing: Unknown (07/18/2024)   Epic    Unable to Pay for Housing in the Last Year: Patient unable to answer    Number of Times Moved in the Last Year: 0    Homeless in the Last Year: Patient declined  Utilities: Patient Unable To Answer (07/18/2024)   Epic    Threatened with loss of utilities: Patient unable to answer  Health Literacy: Not on file   Past Medical History:  Past Medical History:  Diagnosis Date   Biventricular failure (HCC)    CVA (cerebral vascular accident) (HCC)    Polysubstance use disorder    Schizophrenia (HCC)     Past Surgical History:  Procedure Laterality Date   APPENDECTOMY     TRACHEOSTOMY TUBE PLACEMENT N/A 03/04/2024   Procedure: CREATION, TRACHEOSTOMY;  Surgeon: [name] Massie RAMAN, MD;  Location: [facility] ORS;  Service: ENT;  Laterality: N/A;    Current Medications: Current Facility-Administered Medications  Medication Dose Route Frequency Provider Last Rate Last Admin   acetaminophen  (TYLENOL ) tablet 650 mg  650 mg Oral Q6H PRN Hampton, Tracie B, NP   650 mg at 09/07/24 2100   alum & mag hydroxide-simeth (MAALOX/MYLANTA) 200-200-20 MG/5ML suspension 30 mL  30 mL Oral Q4H PRN Hampton, Tracie B, NP   30 mL at 08/05/24 0306   amiodarone  (PACERONE ) tablet 400 mg  400 mg Oral Daily [name], [name], MD   400 mg at 09/16/24 0941   ARIPiprazole  ER (ABILIFY  MAINTENA) injection 400 mg  400 mg Intramuscular Q28 days Lucus Lambertson, MD   400 mg at 08/24/24 9065   benzocaine  (ORAJEL) 10 % mucosal gel   Mouth/Throat TID PRN [name], [name], MD       divalproex  (DEPAKOTE  ER) 24 hr tablet 1,000 mg  1,000 mg Oral QHS Megyn Leng, MD   1,000 mg at 09/15/24 2117   docusate sodium  (COLACE) capsule 100 mg  100 mg Oral Daily Earnie Bechard, MD   100 mg at 09/16/24 0941   feeding supplement (ENSURE PLUS HIGH PROTEIN) liquid 237 mL  237 mL Oral TID BM [name], [name], MD   237 mL at  09/16/24 0948   hydrOXYzine  (ATARAX ) tablet 25 mg  25 mg Oral TID PRN Hampton, Tracie B, NP   25 mg at 09/15/24 2117   magnesium  hydroxide (MILK OF MAGNESIA) suspension 30 mL  30 mL Oral Daily PRN Hampton, Tracie B, NP   30 mL at 08/08/24 2233   memantine  (NAMENDA ) tablet 10 mg  10 mg Oral Daily Becca Bayne, MD   10 mg at 09/16/24 9057   metoprolol  tartrate (LOPRESSOR ) tablet 12.5 mg  12.5 mg Oral BID [name], [name], MD   12.5 mg at 09/16/24 0941   naproxen  (NAPROSYN ) tablet 250 mg  250 mg Oral BID WC Tai Skelly, MD   250 mg at 09/16/24 9057   OLANZapine  (ZYPREXA ) injection 5 mg  5 mg Intramuscular TID PRN Hampton, Tracie  B, NP       OLANZapine  zydis (ZYPREXA ) disintegrating tablet 5 mg  5 mg Oral TID PRN Hampton, Tracie B, NP   5 mg at 08/04/24 1129   oxyCODONE  (Oxy IR/ROXICODONE ) immediate release tablet 5 mg  5 mg Oral Q4H PRN Dorinda Homans T, MD   5 mg at 09/01/24 2102   pantoprazole  (PROTONIX ) EC tablet 40 mg  40 mg Oral Daily [name], [name], MD   40 mg at 09/16/24 9057   sodium chloride  (OCEAN) 0.65 % nasal spray 1 spray  1 spray Each Nare PRN Donnelly Mellow, MD   1 spray at 08/18/24 1459   traZODone  (DESYREL ) tablet 50 mg  50 mg Oral QHS PRN Hampton, Tracie B, NP   50 mg at 09/15/24 2117    Lab Results:  No results found for this or any previous visit (from the past 48 hours).     Blood Alcohol level:  Lab Results  Component Value Date   [facility] <15 07/17/2024   ETH <15 02/20/2024    Metabolic Disorder Labs: Lab Results  Component Value Date   HGBA1C 5.4 08/12/2024   MPG 108.28 08/12/2024   MPG 119.76 07/14/2018   No results found for: PROLACTIN Lab Results  Component Value Date   CHOL 139 08/12/2024   TRIG 73 08/12/2024   HDL 52 08/12/2024   CHOLHDL 2.7 08/12/2024   VLDL 15 08/12/2024   LDLCALC 72 08/12/2024   LDLCALC 119 (H) 07/13/2018    Physical Findings: AIMS:  , ,  ,  ,    CIWA:    COWS:      Psychiatric Specialty Exam:  Presentation   General Appearance: Appropriate for Environment  Eye Contact:Fleeting  Speech:Normal Rate  Speech Volume:Decreased    Mood and Affect  Mood:Anxious  Affect:Flat   Thought Process  Thought Processes:circumstantial Orientation:Partial  Thought Content:discharge focused  Hallucinations:denies  Ideas of Reference:Delusions At baseline Suicidal Thoughts:denies  Homicidal Thoughts:denies   Sensorium  Memory:Immediate Poor; Recent Poor; Remote Poor  Judgment:Impaired  Insight:None   Executive Functions  Concentration:Poor  Attention Span:Poor  Recall:Poor  Fund of Knowledge:Poor  Language:Fair   Psychomotor Activity  Psychomotor Activity:No data recorded  Musculoskeletal: Strength & Muscle Tone: within normal limits Gait & Station: normal Assets  Assets:Communication Skills; Desire for Improvement; Social Support    Physical Exam: Physical Exam Vitals and nursing note reviewed.    ROS Blood pressure (!) 138/54, pulse 63, temperature 97.7 F (36.5 C), resp. rate 18, height 6' 1 (1.854 m), weight 81.2 kg, SpO2 96%. Body mass index is 23.62 kg/m.  Diagnosis: Active Problems:   Schizophrenia, paranoid (HCC)   Dementia with behavioral disturbance (HCC)   PLAN: Safety and Monitoring:  -- Involuntary admission to inpatient psychiatric unit for safety, stabilization and treatment  -- Daily contact with patient to assess and evaluate symptoms and progress in treatment  -- Patient's case to be discussed in multi-disciplinary team meeting  -- Observation Level : q15 minute checks  -- Vital signs:  q12 hours  -- Precautions: suicide, elopement, and assault -- Encouraged patient to participate in unit milieu and in scheduled group therapies  2. Psychiatric Treatment:  Scheduled Medications:  Discontinued oral Abilify  LAY Abilify  Maintena 400 mg q4 weeks given on 07/27/24, 08/24/2024  Cogentin  1mg  one dose given for EPS Depakote  ER increased to  1000 mg to help with mood stabilization  Depakote  levels 09/02/24: 57  -- The risks/benefits/side-effects/alternatives to this medication were discussed in detail with the  patient and time was given for questions. The patient consents to medication trial.  3. Medical Issues Being Addressed:  [facility] consulted for wound care management   Assessment and Plan:   Sacral Decubitus Ulcer Full thickness decubitus ulcer in setting of recent extended hospitalization, discharged from [facility] 14[facility] Wound looks pretty good overall WOC on board we appreciate input   Cognitive Deficit Concern for anoxic brain injury at discharge 02/2024 Delirium precautions   Peripheral Vascular Disease Legs with features c/w peripheral vascular disease, diminished DP pulses Would benefit from outpatient ABI's   HFrEF Euvolemic at this time Continue home medications   Hx Atrial Flutter Not on any anticoagulation at this time EKG with NSR Continue amiodarone   Hypertension Noted, adjust meds gradually after med rec complete   Tooth pain/Infection - Added: Augmentin  along with Orajel BP -better today no longer hypotensive-Will continue reduced dose of metoprolol  12.5 bid.   4. Discharge Planning:   -- Social work and case management to assist with discharge planning and identification of hospital follow-up needs prior to discharge  -- Estimated LOS: 3-4 days  Allyn Foil, MD 09/16/2024, 12:26 PM

## 2024-09-16 NOTE — Plan of Care (Signed)
   Problem: Education: Goal: Knowledge of [facility] Education information/materials will improve Outcome: Progressing Goal: Emotional status will improve Outcome: Progressing Goal: Mental status will improve Outcome: Progressing Goal: Verbalization of understanding the information provided will improve Outcome: Progressing

## 2024-09-16 NOTE — Group Note (Signed)
 Date:  09/16/2024 Time:  8:59 PM  Group Topic/Focus:  Self Care:   The focus of this group is to help patients understand the importance of self-care in order to improve or restore emotional, physical, spiritual, interpersonal, and financial health.    Participation Level:  Did Not Attend  Participation Quality:  None  Affect:  Appropriate  Cognitive:  Lacking  Insight: Limited  Engagement in Group:  Poor  Modes of Intervention:  none  Additional Comments:  none  Gwendlyn LITTIE Sharps 09/16/2024, 8:59 PM

## 2024-09-16 NOTE — Plan of Care (Signed)
" °  Problem: Activity: Goal: Interest or engagement in activities will improve Outcome: Progressing   Problem: Coping: Goal: Ability to verbalize frustrations and anger appropriately will improve Outcome: Progressing   Problem: Education: Goal: Knowledge of [facility] Education information/materials will improve Outcome: Progressing Goal: Emotional status will improve Outcome: Progressing   "

## 2024-09-16 NOTE — Progress Notes (Signed)
 Pt A&Ox2. Disoriented to time and situation. He is pleasant this shift but still forgetful. He denies SI/HI/AVH. He is med compliant and voiced no concerns to this clinical research associate. He woke up a couple times throughout the night to check the time and then went back to bed. Q 15 min safety checks in place.   09/15/24 2116  Psych Admission Type (Psych Patients Only)  Admission Status Involuntary  Psychosocial Assessment  Patient Complaints Confusion  Eye Contact Fair  Facial Expression Animated  Affect Appropriate to circumstance  Speech Tangential  Interaction Assertive  Motor Activity Slow  Appearance/Hygiene Layered clothes  Behavior Characteristics Restless  Mood Pleasant  Thought Process  Coherency Tangential  Content Preoccupation  Delusions None reported or observed  Perception WDL  Hallucination None reported or observed  Judgment Limited  Confusion Mild  Danger to Self  Current suicidal ideation? Denies  Danger to Others  Danger to Others None reported or observed

## 2024-09-16 NOTE — [facility] Group Notes (Signed)
 Spirituality Group   Group Goal: Support / Education around grief and loss   Group Description: Following introductions and group rules, group members engaged in facilitated group dialog and support around topic of loss, with particular support around experiences of loss in their lives. Group members identified types of loss (relationships / self / things) as well as patterns, circumstances, and changes that precipitate loss. Reflection invited on thoughts / feelings around loss, normalized grief responses, and recognized variety in grief experience. Group noted Worden's four tasks of grief in discussion. Group drew on Adlerian / Rogerian, narrative, MI, with [place] group therapy as a primary framework.   Observations: Miguel [name] was more quiet than usual (drowsy?) but still engaged in the group discussion.  Simaya Lumadue L. Delores HERO.Div

## 2024-09-16 NOTE — Group Note (Signed)
 Date:  09/16/2024 Time:  11:55 AM  Group Topic/Focus:  Group Exercise    Participation Level:  Active  Participation Quality:  Appropriate  Affect:  Appropriate  Cognitive:  Appropriate  Insight: Appropriate  Engagement in Group:  Improving  Modes of Intervention:  Activity  Additional Comments:  Pt attended group  Avion Patella E Ellis Koffler 09/16/2024, 11:55 AM

## 2024-09-16 NOTE — Progress Notes (Signed)
" °   09/16/24 1500  Psych Admission Type (Psych Patients Only)  Admission Status Involuntary  Psychosocial Assessment  Patient Complaints Confusion  Eye Contact Fair  Facial Expression Animated  Affect Appropriate to circumstance  Speech Soft  Interaction Assertive  Motor Activity Slow  Appearance/Hygiene Unremarkable  Behavior Characteristics Restless  Mood Pleasant  Thought Process  Coherency Tangential  Content Blaming others  Delusions Paranoid  Perception WDL  Hallucination None reported or observed  Judgment Impaired  Confusion Mild  Danger to Self  Current suicidal ideation? Denies  Danger to Others  Danger to Others None reported or observed    "

## 2024-09-17 DIAGNOSIS — F03918 Unspecified dementia, unspecified severity, with other behavioral disturbance: Secondary | ICD-10-CM | POA: Diagnosis not present

## 2024-09-17 DIAGNOSIS — F200 Paranoid schizophrenia: Secondary | ICD-10-CM | POA: Diagnosis not present

## 2024-09-17 NOTE — Group Note (Signed)
 Date:  09/17/2024 Time:  10:47 AM  Group Topic/Focus:   Effective exercises for geriatric patients focus on improving balance, strength, flexibility, and aerobic fitness, crucial for preventing falls and maintaining independence; key activities include walking, chair yoga/stretching, strength training with bands or light weights (like calf raises, wall push-ups, sit-to-stands), and balance drills (like single-leg stands and heel-to-toe walking), always with a doctor's approval.    Participation Level:  Active  Participation Quality:  Appropriate  Affect:  Appropriate  Cognitive:  Appropriate  Insight: Appropriate  Engagement in Group:  Engaged  Modes of Intervention:  Activity, Education, and Orientation  Additional Comments:  N/A  Miguel [name] Gavel 09/17/2024, 10:47 AM

## 2024-09-17 NOTE — Progress Notes (Signed)
" °   09/17/24 1500  Psych Admission Type (Psych Patients Only)  Admission Status Involuntary  Psychosocial Assessment  Patient Complaints Confusion  Eye Contact Fair  Facial Expression Animated  Affect Preoccupied  Speech Tangential  Interaction Assertive  Motor Activity Slow  Appearance/Hygiene Unremarkable  Behavior Characteristics Restless  Mood Pleasant  Thought Process  Coherency Tangential  Content Blaming others  Delusions None reported or observed  Perception WDL  Hallucination None reported or observed  Judgment Impaired  Confusion Mild  Danger to Self  Current suicidal ideation? Denies  Danger to Others  Danger to Others None reported or observed    "

## 2024-09-17 NOTE — Plan of Care (Signed)
  Problem: Education: Goal: Knowledge of [place] [facility] Education information/materials will improve Outcome: Progressing   Problem: Activity: Goal: Interest or engagement in activities will improve Outcome: Progressing   Problem: Coping: Goal: Ability to verbalize frustrations and anger appropriately will improve Outcome: Progressing   Problem: Education: Goal: Knowledge of  General Education information/materials will improve Outcome: Progressing Goal: Emotional status will improve Outcome: Progressing Goal: Mental status will improve Outcome: Progressing Goal: Verbalization of understanding the information provided will improve Outcome: Progressing   Problem: Activity: Goal: Interest or engagement in activities will improve Outcome: Progressing Goal: Sleeping patterns will improve Outcome: Progressing   Problem: Coping: Goal: Ability to verbalize frustrations and anger appropriately will improve Outcome: Progressing Goal: Ability to demonstrate self-control will improve Outcome: Progressing   Problem: Health Behavior/Discharge Planning: Goal: Identification of resources available to assist in meeting health care needs will improve Outcome: Progressing Goal: Compliance with treatment plan for underlying cause of condition will improve Outcome: Progressing   Problem: Physical Regulation: Goal: Ability to maintain clinical measurements within normal limits will improve Outcome: Progressing   Problem: Safety: Goal: Periods of time without injury will increase Outcome: Progressing   Problem: Coping: Goal: Ability to adjust to condition or change in health will improve Outcome: Progressing   Problem: Health Behavior/Discharge Planning: Goal: Ability to identify and utilize available resources and services will improve Outcome: Progressing

## 2024-09-17 NOTE — Progress Notes (Signed)
" °   09/17/24 2200  Psych Admission Type (Psych Patients Only)  Admission Status Involuntary  Psychosocial Assessment  Patient Complaints Confusion  Eye Contact Fair  Facial Expression Animated  Affect Preoccupied  Speech Tangential  Interaction Assertive  Motor Activity Slow  Appearance/Hygiene Unremarkable  Behavior Characteristics Restless  Mood Pleasant  Thought Process  Coherency Tangential  Content Preoccupation  Delusions None reported or observed  Perception WDL  Hallucination None reported or observed  Judgment Impaired  Confusion Mild  Danger to Self  Current suicidal ideation? Denies  Danger to Others  Danger to Others None reported or observed   Mood/Behavior:  Pleasant and cooperative. Pt noted confused, anxious and preoccupied standing at bedroom door with only shirt and brief on and requiring redirection. Pt able to get dressed then come out to the dayroom for snack. Pt restless some standing at nursing station and in room and then was able to go to room and noted asleep.    Psych assessment: Denies SI/HI and AVH.     Interaction / Group attendance:  Present in the milieu. Interacting with peers and staff.  Did not attend group.   Medication/ PRNs: Compliant with scheduled medications. Required PRNs Trazodone  for sleep and noted decrease effect. Required prn Atarax  for anxiety noted decrease effect.   Pain: Denies  15 min checks in place for safety. "

## 2024-09-17 NOTE — Group Note (Signed)
 Date:  09/17/2024 Time:  9:34 PM  Group Topic/Focus:  Goals Group:   The focus of this group is to help patients establish daily goals to achieve during treatment and discuss how the patient can incorporate goal setting into their daily lives to aide in recovery.    Participation Level:  Did Not Attend  Participation Quality:     Affect:  Not Congruent  Cognitive:    Insight: None  Engagement in Group:  None  Modes of Intervention:     Additional Comments:    Miguel [name] 09/17/2024, 9:34 PM

## 2024-09-17 NOTE — Group Note (Unsigned)
 Date:  09/17/2024 Time:  12:17 PM  Group Topic/Focus:    group   Participation Level:  {[facility] PARTICIPATION OZCZO:77735}  Participation Quality:  {[facility] PARTICIPATION QUALITY:22265}  Affect:  {B[facility] AFFECT:22266}  Cognitive:  {B[facility] COGNITIVE:22267}  Insight: {BHH Insight2:20797}  Engagement in Group:  {B[facility] ENGAGEMENT IN HMNLE:77731}  Modes of Intervention:  {BHH MODES OF INTERVENTION:22269}  Additional Comments:  ***  [name] 09/17/2024, 12:17 PM

## 2024-09-17 NOTE — Group Note (Signed)
 [facility] LCSW Group Therapy Note   Group Date: 09/17/2024 Start Time: 1315 End Time: 1345   Type of Therapy/Topic:  Group Therapy:  Emotion Regulation  Participation Level:  Minimal   Mood:  Description of Group:    The purpose of this group is to assist patients in learning to regulate negative emotions and experience positive emotions. Patients will be guided to discuss ways in which they have been vulnerable to their negative emotions. These vulnerabilities will be juxtaposed with experiences of positive emotions or situations, and patients challenged to use positive emotions to combat negative ones. Special emphasis will be placed on coping with negative emotions in conflict situations, and patients will process healthy conflict resolution skills.  Therapeutic Goals: Patient will identify two positive emotions or experiences to reflect on in order to balance out negative emotions:  Patient will label two or more emotions that they find the most difficult to experience:  Patient will be able to demonstrate positive conflict resolution skills through discussion or role plays:   Summary of Patient Progress:   Pt was sleeping for most of group but did wake up.     Therapeutic Modalities:   Cognitive Behavioral Therapy Feelings Identification Dialectical Behavioral Therapy   Lum Miguel [name], LCSWA

## 2024-09-17 NOTE — Plan of Care (Signed)
" °  Problem: Activity: Goal: Interest or engagement in activities will improve Outcome: Progressing   Problem: Coping: Goal: Ability to verbalize frustrations and anger appropriately will improve Outcome: Progressing   Problem: Education: Goal: Knowledge of [facility] Education information/materials will improve Outcome: Progressing Goal: Emotional status will improve Outcome: Progressing Goal: Mental status will improve Outcome: Progressing Goal: Verbalization of understanding the information provided will improve Outcome: Progressing   "

## 2024-09-17 NOTE — Group Note (Signed)
 Recreation Therapy Group Note   Group Topic:Coping Skills  Group Date: 09/17/2024 Start Time: 1400 End Time: 1430 Facilitators: Celestia Jeoffrey BRAVO, LRT, CTRS Location: Dayroom  Group Description: Mind Map.  Patient was provided a blank template of a diagram with 32 blank boxes in a tiered system, branching from the center (similar to a bubble chart). LRT directed patients to label the middle of the diagram Coping Skills. LRT and patients then came up with 8 different coping skills as examples. Pt were directed to record their coping skills in the 2nd tier boxes closest to the center.  Patients would then share their coping skills with the group as LRT wrote them out. LRT gave a handout of 99 different coping skills at the end of group.   Goal Area(s) Addressed: Patients will be able to define coping skills. Patient will identify new coping skills.  Patient will increase communication.   Affect/Mood: Appropriate   Participation Level: Moderate    Clinical Observations/Individualized Feedback: Benton was mostly active in their participation of session activities and group discussion. Pt identified card games and my most valuable attention as coping skills.    Plan: Continue to engage patient in RT group sessions 2-3x/week.   Jeoffrey BRAVO Celestia, LRT, CTRS 09/17/2024 4:24 PM

## 2024-09-17 NOTE — BHH Counselor (Signed)
 CSW received note from nursing staff that pt's legal guardian Evia Luther), Cherene reports Ms.Tammy at [employer] has not received FL2 at this time.   CSW called Cherene and confirmed phone number to contact Ms.Tammy at.   CSW called Ms. Tammy [phone]) at number provided by Cherene.   Ms.Tammy reports CSW can send FL2 directly to her personal email: [email or web address]   CSW sent FL2 per Ms.Tammy and pt's guardian's request

## 2024-09-17 NOTE — Progress Notes (Signed)
 [facility] MD Progress Note  09/17/2024 12:07 PM Miguel [name]  MRN:  [id number]  Miguel [name] is a 74 year old male with a past psychiatric history significant for schizophrenia who presents under involuntary commitment initiated by family due to medication non-adherence, increasing paranoia, and behavioral disorganization. 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 Brother reports multiple episodes of patient whispering through the night, stating people were talking about him and planning to harm him. Patient was advised by his ACT Team to come to the ED; he refused, prompting the family to pursue IVC.Patient is admitted to [facility] unit with Q15 min safety monitoring. Multidisciplinary team approach is offered. Medication management; group/milieu therapy is offered.   Subjective:  Chart reviewed, case discussed in multidisciplinary meeting, patient seen during rounds.  Patient is noted to be sitting in his room.  He remains confused and disorganized stating that he is thinking through things.  He then starts complaining spending 25 years of his life in prison and he expects better.  He complains about his family members not understanding him and then make statements of his family members getting killed.  He denies SI/HI/plan denies hallucinations.  Past Psychiatric History: see h&P Family History:  Family History  Family history unknown: Yes   Social History:  Social History   Substance and Sexual Activity  Alcohol Use No     Social History   Substance and Sexual Activity   Drug Use No    Social History   Socioeconomic History   Marital status: Divorced    Spouse name: Not on file   Number of children: Not on file   Years of education: Not on file   Highest education level: Not on file  Occupational History   Not on file  Tobacco Use   Smoking status: Every Day    Current packs/day: 0.50    Average packs/day: 0.5 packs/day for 46.1 years (23.0 ttl pk-yrs)    Types: Cigarettes    Start date: 38   Smokeless tobacco: Never  Vaping Use   Vaping status: Never Used  Substance and Sexual Activity   Alcohol use: No   Drug use: No   Sexual activity: Not on file  Other Topics Concern   Not on file  Social History Narrative   Not on file   Social Drivers of Health   Tobacco Use: High Risk (07/18/2024)   Patient History    Smoking Tobacco Use: Every Day    Smokeless Tobacco Use: Never    Passive Exposure: Not on file  Financial Resource Strain: Not on file  Food Insecurity: Patient Unable To Answer (07/18/2024)   Epic    Worried About Programme Researcher, Broadcasting/film/video in the Last Year: Patient unable to answer    Ran Out of Food in the Last Year: Patient unable to answer  Transportation Needs: Unmet Transportation Needs (07/18/2024)   Epic    Lack of Transportation (Medical): Yes    Lack of Transportation (Non-Medical): Yes  Physical Activity: Not on file  Stress: Not on file  Social Connections: Patient Unable To Answer (07/18/2024)   Social Connection and Isolation Panel    Frequency of Communication with Friends and Family: Patient  unable to answer    Frequency of Social Gatherings with Friends and Family: Patient unable to answer    Attends Religious Services: Patient unable to answer    Active Member of Clubs or Organizations: Patient unable to answer    Attends Club or Organization Meetings: Patient unable to answer    Marital Status: Patient unable to answer  Depression (PHQ2-9): Not on file  Alcohol Screen: Low Risk (07/18/2024)   Alcohol  Screen    Last Alcohol Screening Score (AUDIT): 0  Housing: Unknown (07/18/2024)   Epic    Unable to Pay for Housing in the Last Year: Patient unable to answer    Number of Times Moved in the Last Year: 0    Homeless in the Last Year: Patient declined  Utilities: Patient Unable To Answer (07/18/2024)   Epic    Threatened with loss of utilities: Patient unable to answer  Health Literacy: Not on file   Past Medical History:  Past Medical History:  Diagnosis Date   Biventricular failure (HCC)    CVA (cerebral vascular accident) (HCC)    Polysubstance use disorder    Schizophrenia (HCC)     Past Surgical History:  Procedure Laterality Date   APPENDECTOMY     TRACHEOSTOMY TUBE PLACEMENT N/A 03/04/2024   Procedure: CREATION, TRACHEOSTOMY;  Surgeon: [name] Massie RAMAN, MD;  Location: [facility] ORS;  Service: ENT;  Laterality: N/A;    Current Medications: Current Facility-Administered Medications  Medication Dose Route Frequency Provider Last Rate Last Admin   acetaminophen  (TYLENOL ) tablet 650 mg  650 mg Oral Q6H PRN Hampton, Tracie B, NP   650 mg at 09/07/24 2100   alum & mag hydroxide-simeth (MAALOX/MYLANTA) 200-200-20 MG/5ML suspension 30 mL  30 mL Oral Q4H PRN Hampton, Tracie B, NP   30 mL at 08/05/24 0306   amiodarone  (PACERONE ) tablet 400 mg  400 mg Oral Daily [name], [name], MD   400 mg at 09/17/24 0915   ARIPiprazole  ER (ABILIFY  MAINTENA) injection 400 mg  400 mg Intramuscular Q28 days Javis Abboud, MD   400 mg at 08/24/24 0934   benzocaine  (ORAJEL) 10 % mucosal gel   Mouth/Throat TID PRN [name], [name], MD       divalproex  (DEPAKOTE  ER) 24 hr tablet 1,000 mg  1,000 mg Oral QHS Verlisa Vara, MD   1,000 mg at 09/16/24 2140   docusate sodium  (COLACE) capsule 100 mg  100 mg Oral Daily Arianne Klinge, MD   100 mg at 09/17/24 0916   feeding supplement (ENSURE PLUS HIGH PROTEIN) liquid 237 mL  237 mL Oral TID BM [name], [name], MD   237 mL at 09/17/24 9082   hydrOXYzine  (ATARAX )  tablet 25 mg  25 mg Oral TID PRN Hampton, Tracie B, NP   25 mg at 09/16/24 2140   magnesium  hydroxide (MILK OF MAGNESIA) suspension 30 mL  30 mL Oral Daily PRN Hampton, Tracie B, NP   30 mL at 08/08/24 2233   memantine  (NAMENDA ) tablet 10 mg  10 mg Oral Daily Jemar Paulsen, MD   10 mg at 09/17/24 9082   metoprolol  tartrate (LOPRESSOR ) tablet 12.5 mg  12.5 mg Oral BID [name], [name], MD   12.5 mg at 09/17/24 9082   naproxen  (NAPROSYN ) tablet 250 mg  250 mg Oral BID WC Chantele Corado, MD   250 mg at 09/17/24 9083   OLANZapine  (ZYPREXA ) injection 5 mg  5 mg Intramuscular TID PRN Hampton, Tracie B, NP  OLANZapine  zydis (ZYPREXA ) disintegrating tablet 5 mg  5 mg Oral TID PRN Hampton, Tracie B, NP   5 mg at 08/04/24 1129   oxyCODONE  (Oxy IR/ROXICODONE ) immediate release tablet 5 mg  5 mg Oral Q4H PRN Dorinda Homans T, MD   5 mg at 09/01/24 2102   pantoprazole  (PROTONIX ) EC tablet 40 mg  40 mg Oral Daily [name], [name], MD   40 mg at 09/17/24 9083   sodium chloride  (OCEAN) 0.65 % nasal spray 1 spray  1 spray Each Nare PRN Daleisa Halperin, MD   1 spray at 08/18/24 1459   traZODone  (DESYREL ) tablet 50 mg  50 mg Oral QHS PRN Hampton, Tracie B, NP   50 mg at 09/16/24 2140    Lab Results:  No results found for this or any previous visit (from the past 48 hours).     Blood Alcohol level:  Lab Results  Component Value Date   [facility] <15 07/17/2024   ETH <15 02/20/2024    Metabolic Disorder Labs: Lab Results  Component Value Date   HGBA1C 5.4 08/12/2024   MPG 108.28 08/12/2024   MPG 119.76 07/14/2018   No results found for: PROLACTIN Lab Results  Component Value Date   CHOL 139 08/12/2024   TRIG 73 08/12/2024   HDL 52 08/12/2024   CHOLHDL 2.7 08/12/2024   VLDL 15 08/12/2024   LDLCALC 72 08/12/2024   LDLCALC 119 (H) 07/13/2018    Physical Findings: AIMS:  , ,  ,  ,    CIWA:    COWS:      Psychiatric Specialty Exam:  Presentation  General Appearance: Appropriate for  Environment  Eye Contact:Fleeting  Speech:Normal Rate  Speech Volume:Decreased    Mood and Affect  Mood:Anxious  Affect:Flat   Thought Process  Thought Processes:circumstantial Orientation:Partial  Thought Content:discharge focused  Hallucinations:denies  Ideas of Reference:Delusions At baseline Suicidal Thoughts:denies  Homicidal Thoughts:denies   Sensorium  Memory:Immediate Poor; Recent Poor; Remote Poor  Judgment:Impaired  Insight:None   Executive Functions  Concentration:Poor  Attention Span:Poor  Recall:Poor  Fund of Knowledge:Poor  Language:Fair   Psychomotor Activity  Psychomotor Activity:No data recorded  Musculoskeletal: Strength & Muscle Tone: within normal limits Gait & Station: normal Assets  Assets:Communication Skills; Desire for Improvement; Social Support    Physical Exam: Physical Exam Vitals and nursing note reviewed.    ROS Blood pressure (!) 146/65, pulse 65, temperature 98.5 F (36.9 C), resp. rate 14, height 6' 1 (1.854 m), weight 81.2 kg, SpO2 100%. Body mass index is 23.62 kg/m.  Diagnosis: Active Problems:   Schizophrenia, paranoid (HCC)   Dementia with behavioral disturbance (HCC)   PLAN: Safety and Monitoring:  -- Involuntary admission to inpatient psychiatric unit for safety, stabilization and treatment  -- Daily contact with patient to assess and evaluate symptoms and progress in treatment  -- Patient's case to be discussed in multi-disciplinary team meeting  -- Observation Level : q15 minute checks  -- Vital signs:  q12 hours  -- Precautions: suicide, elopement, and assault -- Encouraged patient to participate in unit milieu and in scheduled group therapies  2. Psychiatric Treatment:  Scheduled Medications:  Discontinued oral Abilify  LAY Abilify  Maintena 400 mg q4 weeks given on 07/27/24, 08/24/2024  Cogentin  1mg  one dose given for EPS Depakote  ER increased to 1000 mg to help with mood  stabilization  Depakote  levels 09/02/24: 57  -- The risks/benefits/side-effects/alternatives to this medication were discussed in detail with the patient and time was given for questions. The  patient consents to medication trial.  3. Medical Issues Being Addressed:  [facility] consulted for wound care management   Assessment and Plan:   Sacral Decubitus Ulcer Full thickness decubitus ulcer in setting of recent extended hospitalization, discharged from [facility] 14[facility] Wound looks pretty good overall WOC on board we appreciate input   Cognitive Deficit Concern for anoxic brain injury at discharge 02/2024 Delirium precautions   Peripheral Vascular Disease Legs with features c/w peripheral vascular disease, diminished DP pulses Would benefit from outpatient ABI's   HFrEF Euvolemic at this time Continue home medications   Hx Atrial Flutter Not on any anticoagulation at this time EKG with NSR Continue amiodarone   Hypertension Noted, adjust meds gradually after med rec complete   Tooth pain/Infection - Added: Augmentin  along with Orajel BP -better today no longer hypotensive-Will continue reduced dose of metoprolol  12.5 bid.   4. Discharge Planning:   -- Social work and case management to assist with discharge planning and identification of hospital follow-up needs prior to discharge  -- Estimated LOS: 3-4 days  Allyn Foil, MD 09/17/2024, 12:07 PM

## 2024-09-17 NOTE — Progress Notes (Signed)
 Pt disoriented to time and situation. He denies pain at this time. He denies SI/HI/AVH this shift. Q 15 min safety checks in place.   09/16/24 2140  Psych Admission Type (Psych Patients Only)  Admission Status Involuntary  Psychosocial Assessment  Patient Complaints Restlessness;Confusion  Eye Contact Fair  Facial Expression Animated  Affect Preoccupied  Speech Tangential  Interaction Assertive  Motor Activity Slow  Appearance/Hygiene Layered clothes  Behavior Characteristics Restless  Mood Pleasant  Thought Process  Coherency Tangential  Content Confabulation  Delusions None reported or observed  Perception WDL  Hallucination None reported or observed  Judgment Poor  Confusion Moderate  Danger to Self  Current suicidal ideation? Denies  Danger to Others  Danger to Others None reported or observed

## 2024-09-18 DIAGNOSIS — F03918 Unspecified dementia, unspecified severity, with other behavioral disturbance: Secondary | ICD-10-CM | POA: Diagnosis not present

## 2024-09-18 DIAGNOSIS — F200 Paranoid schizophrenia: Secondary | ICD-10-CM | POA: Diagnosis not present

## 2024-09-18 NOTE — Progress Notes (Signed)
 [facility] MD Progress Note  09/18/2024 9:17 PM [name]  MRN:  [id number]  Unnamed Zeien. Miguel [name] is a 74 year old male with a past psychiatric history significant for schizophrenia who presents under involuntary commitment initiated by family due to medication non-adherence, increasing paranoia, and behavioral disorganization. 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 Brother reports multiple episodes of patient whispering through the night, stating people were talking about him and planning to harm him. Patient was advised by his ACT Team to come to the ED; he refused, prompting the family to pursue IVC.Patient is admitted to [facility] unit with Q15 min safety monitoring. Multidisciplinary team approach is offered. Medication management; group/milieu therapy is offered.   Subjective:  Chart reviewed, case discussed in multidisciplinary meeting, patient seen during rounds.  Patient is noted to be sitting in the waiting area without difficulties.  He is calm and cooperative with no behavioral problems. he is compliant with medications..  He continues to be disorganized in his thought process but no behavioral problems. He has fair appetite and sleep.  Past Psychiatric History: see h&P Family History:  Family History  Family history unknown: Yes   Social History:  Social History   Substance and Sexual Activity  Alcohol Use No     Social History   Substance and Sexual Activity  Drug Use No    Social History   Socioeconomic History   Marital status: Divorced    Spouse name:  Not on file   Number of children: Not on file   Years of education: Not on file   Highest education level: Not on file  Occupational History   Not on file  Tobacco Use   Smoking status: Every Day    Current packs/day: 0.50    Average packs/day: 0.5 packs/day for 46.1 years (23.0 ttl pk-yrs)    Types: Cigarettes    Start date: 33   Smokeless tobacco: Never  Vaping Use   Vaping status: Never Used  Substance and Sexual Activity   Alcohol use: No   Drug use: No   Sexual activity: Not on file  Other Topics Concern   Not on file  Social History Narrative   Not on file   Social Drivers of Health   Tobacco Use: High Risk (07/18/2024)   Patient History    Smoking Tobacco Use: Every Day    Smokeless Tobacco Use: Never    Passive Exposure: Not on file  Financial Resource Strain: Not on file  Food Insecurity: Patient Unable To Answer (07/18/2024)   Epic    Worried About Programme Researcher, Broadcasting/film/video in the Last Year: Patient unable to answer    Ran Out of Food in the Last Year: Patient unable to answer  Transportation Needs: Unmet Transportation Needs (07/18/2024)   Epic    Lack of Transportation (Medical): Yes    Lack of Transportation (Non-Medical): Yes  Physical Activity: Not on file  Stress: Not on file  Social Connections: Patient Unable To Answer (07/18/2024)   Social Connection and Isolation Panel    Frequency of Communication with Friends and Family: Patient unable to answer    Frequency of Social Gatherings with Friends and Family: Patient unable to answer  Attends Religious Services: Patient unable to answer    Active Member of Clubs or Organizations: Patient unable to answer    Attends Club or Organization Meetings: Patient unable to answer    Marital Status: Patient unable to answer  Depression (PHQ2-9): Not on file  Alcohol Screen: Low Risk (07/18/2024)   Alcohol Screen    Last Alcohol Screening Score (AUDIT): 0  Housing: Unknown (07/18/2024)   Epic    Unable to Pay  for Housing in the Last Year: Patient unable to answer    Number of Times Moved in the Last Year: 0    Homeless in the Last Year: Patient declined  Utilities: Patient Unable To Answer (07/18/2024)   Epic    Threatened with loss of utilities: Patient unable to answer  Health Literacy: Not on file   Past Medical History:  Past Medical History:  Diagnosis Date   Biventricular failure (HCC)    CVA (cerebral vascular accident) (HCC)    Polysubstance use disorder    Schizophrenia (HCC)     Past Surgical History:  Procedure Laterality Date   APPENDECTOMY     TRACHEOSTOMY TUBE PLACEMENT N/A 03/04/2024   Procedure: CREATION, TRACHEOSTOMY;  Surgeon: [name] Massie RAMAN, MD;  Location: [facility] ORS;  Service: ENT;  Laterality: N/A;    Current Medications: Current Facility-Administered Medications  Medication Dose Route Frequency Provider Last Rate Last Admin   acetaminophen  (TYLENOL ) tablet 650 mg  650 mg Oral Q6H PRN Hampton, Tracie B, NP   650 mg at 09/17/24 2221   alum & mag hydroxide-simeth (MAALOX/MYLANTA) 200-200-20 MG/5ML suspension 30 mL  30 mL Oral Q4H PRN Hampton, Tracie B, NP   30 mL at 08/05/24 0306   amiodarone  (PACERONE ) tablet 400 mg  400 mg Oral Daily [name], [name], MD   400 mg at 09/17/24 0915   ARIPiprazole  ER (ABILIFY  MAINTENA) injection 400 mg  400 mg Intramuscular Q28 days Rosmary Dionisio, MD   400 mg at 08/24/24 0934   benzocaine  (ORAJEL) 10 % mucosal gel   Mouth/Throat TID PRN [name], [name], MD       divalproex  (DEPAKOTE  ER) 24 hr tablet 1,000 mg  1,000 mg Oral QHS Meyli Boice, MD   1,000 mg at 09/17/24 2127   docusate sodium  (COLACE) capsule 100 mg  100 mg Oral Daily Evart Mcdonnell, MD   100 mg at 09/18/24 0941   feeding supplement (ENSURE PLUS HIGH PROTEIN) liquid 237 mL  237 mL Oral TID BM [name], [name], MD   237 mL at 09/18/24 1525   hydrOXYzine  (ATARAX ) tablet 25 mg  25 mg Oral TID PRN Hampton, Tracie B, NP   25 mg at 09/17/24 2127   magnesium  hydroxide (MILK OF  MAGNESIA) suspension 30 mL  30 mL Oral Daily PRN Hampton, Tracie B, NP   30 mL at 08/08/24 2233   memantine  (NAMENDA ) tablet 10 mg  10 mg Oral Daily Jaesean Litzau, MD   10 mg at 09/18/24 0941   metoprolol  tartrate (LOPRESSOR ) tablet 12.5 mg  12.5 mg Oral BID [name], [name], MD   12.5 mg at 09/17/24 2128   naproxen  (NAPROSYN ) tablet 250 mg  250 mg Oral BID WC Yaritza Leist, MD   250 mg at 09/18/24 1738   OLANZapine  (ZYPREXA ) injection 5 mg  5 mg Intramuscular TID PRN Hampton, Tracie B, NP       OLANZapine  zydis (ZYPREXA ) disintegrating tablet 5 mg  5 mg Oral TID PRN Hampton, Tracie B, NP   5 mg  at 08/04/24 1129   oxyCODONE  (Oxy IR/ROXICODONE ) immediate release tablet 5 mg  5 mg Oral Q4H PRN Dorinda Homans T, MD   5 mg at 09/01/24 2102   pantoprazole  (PROTONIX ) EC tablet 40 mg  40 mg Oral Daily [name], [name], MD   40 mg at 09/18/24 0941   sodium chloride  (OCEAN) 0.65 % nasal spray 1 spray  1 spray Each Nare PRN Donnelly Mellow, MD   1 spray at 08/18/24 1459   traZODone  (DESYREL ) tablet 50 mg  50 mg Oral QHS PRN Hampton, Tracie B, NP   50 mg at 09/17/24 2127    Lab Results:  No results found for this or any previous visit (from the past 48 hours).     Blood Alcohol level:  Lab Results  Component Value Date   [facility] <15 07/17/2024   ETH <15 02/20/2024    Metabolic Disorder Labs: Lab Results  Component Value Date   HGBA1C 5.4 08/12/2024   MPG 108.28 08/12/2024   MPG 119.76 07/14/2018   No results found for: PROLACTIN Lab Results  Component Value Date   CHOL 139 08/12/2024   TRIG 73 08/12/2024   HDL 52 08/12/2024   CHOLHDL 2.7 08/12/2024   VLDL 15 08/12/2024   LDLCALC 72 08/12/2024   LDLCALC 119 (H) 07/13/2018    Physical Findings: AIMS:  , ,  ,  ,    CIWA:    COWS:      Psychiatric Specialty Exam:  Presentation  General Appearance: Appropriate for Environment  Eye Contact:Fleeting  Speech:Normal Rate  Speech Volume:Decreased    Mood and Affect   Mood:Anxious  Affect:Flat   Thought Process  Thought Processes:circumstantial Orientation:Partial  Thought Content:discharge focused  Hallucinations:denies  Ideas of Reference:Delusions At baseline Suicidal Thoughts:denies  Homicidal Thoughts:denies   Sensorium  Memory:Immediate Poor; Recent Poor; Remote Poor  Judgment:Impaired  Insight:None   Executive Functions  Concentration:Poor  Attention Span:Poor  Recall:Poor  Fund of Knowledge:Poor  Language:Fair   Psychomotor Activity  Psychomotor Activity:No data recorded  Musculoskeletal: Strength & Muscle Tone: within normal limits Gait & Station: normal Assets  Assets:Communication Skills; Desire for Improvement; Social Support    Physical Exam: Physical Exam Vitals and nursing note reviewed.    ROS Blood pressure (!) 103/44, pulse 66, temperature 98.7 F (37.1 C), resp. rate 18, height 6' 1 (1.854 m), weight 81.2 kg, SpO2 92%. Body mass index is 23.62 kg/m.  Diagnosis: Active Problems:   Schizophrenia, paranoid (HCC)   Dementia with behavioral disturbance (HCC)   PLAN: Safety and Monitoring:  -- Involuntary admission to inpatient psychiatric unit for safety, stabilization and treatment  -- Daily contact with patient to assess and evaluate symptoms and progress in treatment  -- Patient's case to be discussed in multi-disciplinary team meeting  -- Observation Level : q15 minute checks  -- Vital signs:  q12 hours  -- Precautions: suicide, elopement, and assault -- Encouraged patient to participate in unit milieu and in scheduled group therapies  2. Psychiatric Treatment:  Scheduled Medications:  Discontinued oral Abilify  LAY Abilify  Maintena 400 mg q4 weeks given on 07/27/24, 08/24/2024  Cogentin  1mg  one dose given for EPS Depakote  ER increased to 1000 mg to help with mood stabilization  Depakote  levels 09/02/24: 57  -- The risks/benefits/side-effects/alternatives to this medication were  discussed in detail with the patient and time was given for questions. The patient consents to medication trial.  3. Medical Issues Being Addressed:  [facility] consulted for wound care management   Assessment  and Plan:   Sacral Decubitus Ulcer Full thickness decubitus ulcer in setting of recent extended hospitalization, discharged from [facility] Nov 14th Wound looks pretty good overall WOC on board we appreciate input   Cognitive Deficit Concern for anoxic brain injury at discharge 02/2024 Delirium precautions   Peripheral Vascular Disease Legs with features c/w peripheral vascular disease, diminished DP pulses Would benefit from outpatient ABI's   HFrEF Euvolemic at this time Continue home medications   Hx Atrial Flutter Not on any anticoagulation at this time EKG with NSR Continue amiodarone   Hypertension Noted, adjust meds gradually after med rec complete   Tooth pain/Infection - Added: Augmentin  along with Orajel BP -better today no longer hypotensive-Will continue reduced dose of metoprolol  12.5 bid.   4. Discharge Planning:   -- Social work and case management to assist with discharge planning and identification of hospital follow-up needs prior to discharge  -- Estimated LOS: 3-4 days  Allyn Foil, MD 09/18/2024, 9:17 PM

## 2024-09-18 NOTE — Progress Notes (Signed)
 Behavior:  Confused.  Pleasant and cooperative.   Psych assessment: Denies SI/HI and AVH.  Group attendance:  1/2  Medication/ PRNs:  Cardiac meds held due to low BP.  Dr. JINNY aware.  Pain: Denies.  15 min checks in place for safety.    Wound cleaned and dressing changed.

## 2024-09-18 NOTE — Plan of Care (Signed)
  Problem: Education: Goal: Knowledge of [place] [facility] Education information/materials will improve Outcome: Progressing   Problem: Activity: Goal: Interest or engagement in activities will improve Outcome: Progressing   Problem: Coping: Goal: Ability to verbalize frustrations and anger appropriately will improve Outcome: Progressing   Problem: Education: Goal: Knowledge of  General Education information/materials will improve Outcome: Progressing Goal: Emotional status will improve Outcome: Progressing Goal: Mental status will improve Outcome: Progressing Goal: Verbalization of understanding the information provided will improve Outcome: Progressing   Problem: Activity: Goal: Interest or engagement in activities will improve Outcome: Progressing Goal: Sleeping patterns will improve Outcome: Progressing   Problem: Coping: Goal: Ability to verbalize frustrations and anger appropriately will improve Outcome: Progressing Goal: Ability to demonstrate self-control will improve Outcome: Progressing   Problem: Health Behavior/Discharge Planning: Goal: Identification of resources available to assist in meeting health care needs will improve Outcome: Progressing Goal: Compliance with treatment plan for underlying cause of condition will improve Outcome: Progressing   Problem: Physical Regulation: Goal: Ability to maintain clinical measurements within normal limits will improve Outcome: Progressing   Problem: Safety: Goal: Periods of time without injury will increase Outcome: Progressing   Problem: Coping: Goal: Ability to adjust to condition or change in health will improve Outcome: Progressing   Problem: Health Behavior/Discharge Planning: Goal: Ability to identify and utilize available resources and services will improve Outcome: Progressing

## 2024-09-18 NOTE — Group Note (Signed)
 Recreation Therapy Group Note   Group Topic:Healthy Support Systems  Group Date: 09/18/2024 Start Time: 1400 End Time: 1445 Facilitators: Celestia Jeoffrey BRAVO, LRT, CTRS Location: Dayroom  Group Description: Straw Bridge. In groups or individually, patients were given 10 plastic drinking straws and an equal length of masking tape. Using the materials provided, patients were instructed to build a free-standing bridge-like structure to suspend an everyday item (ex: deck of cards) off the floor or table surface. All materials were required to be used in secondary school teacher. LRT facilitated post-activity discussion reviewing the importance of having strong and healthy support systems in our lives. LRT discussed how the people in our lives serve as the tape and the deck of cards we placed on top of our straw structure are the stressors we face in daily life. LRT and pts discussed what happens in our life when things get too heavy for us , and we don't have strong supports outside of the hospital. Pt shared 2 of their healthy supports in their life aloud in the group.   Goal Area(s) Addressed:  Patient will identify 2 healthy supports in their life. Patient will identify skills to successfully complete activity. Patient will identify correlation of this activity to life post-discharge.  Patient will build on frustration tolerance skills. Patient will increase team building and communication skills.    Affect/Mood: N/A   Participation Level: Did not attend    Clinical Observations/Individualized Feedback: Patient did not attend.  Plan: Continue to engage patient in RT group sessions 2-3x/week.   Jeoffrey BRAVO Celestia, LRT, CTRS 09/18/2024 4:45 PM

## 2024-09-18 NOTE — Progress Notes (Signed)
" °   09/18/24 2004  Psych Admission Type (Psych Patients Only)  Admission Status Involuntary  Psychosocial Assessment  Patient Complaints Confusion  Eye Contact Fair  Facial Expression Animated  Affect Preoccupied  Speech Tangential  Interaction Assertive  Motor Activity Slow  Appearance/Hygiene Unremarkable  Behavior Characteristics Cooperative  Mood Pleasant  Thought Process  Coherency Tangential  Content Preoccupation  Delusions None reported or observed  Perception WDL  Hallucination None reported or observed  Judgment Impaired  Confusion Mild  Danger to Self  Current suicidal ideation? Denies  Danger to Others  Danger to Others None reported or observed   Mood/Behavior:  Pleasant and cooperative. Pt anxious, suspicious and preoccupied standing at bedroom door. Peaking from behind the door.    Psych assessment: Denies SI/HI and AVH.     Interaction / Group attendance:  Present in the milieu. Interacting with peers and staff.  Did not attend group.   Medication/ PRNs: Compliant with scheduled medications. Required PRNs Trazodone  for sleep and noted decrease effect. Required prn Atarax  for anxiety noted decrease effect.   Pain: Denies   15 min checks in place for safety. "

## 2024-09-18 NOTE — Group Note (Deleted)
 Date:  09/18/2024 Time:  10:13 PM  Group Topic/Focus:  Goals Group:   The focus of this group is to help patients establish daily goals to achieve during treatment and discuss how the patient can incorporate goal setting into their daily lives to aide in recovery.    Participation Level:  {BHH PARTICIPATION OZCZO:77735}  Participation Quality:  {BHH PARTICIPATION QUALITY:22265}  Affect:  {BHH AFFECT:22266}  Cognitive:  {BHH COGNITIVE:22267}  Insight: {BHH Insight2:20797}  Engagement in Group:  {BHH ENGAGEMENT IN HMNLE:77731}  Modes of Intervention:  {BHH MODES OF INTERVENTION:22269}  Additional Comments:  ***  Miguel [name] 09/18/2024, 10:13 PM

## 2024-09-18 NOTE — Plan of Care (Signed)
" °  Problem: Activity: Goal: Interest or engagement in activities will improve Outcome: Progressing   Problem: Health Behavior/Discharge Planning: Goal: Compliance with treatment plan for underlying cause of condition will improve Outcome: Progressing   Problem: Education: Goal: Verbalization of understanding the information provided will improve Outcome: Not Progressing   "

## 2024-09-18 NOTE — BHH Counselor (Signed)
 Pt was visited by Kia at [facility] to complete an assessment.   Pt successfully completed assessment.   Kia reports at this time pt will be able to transition to the home.   CSW awaits further details from placement and pt's guardian regarding transitioning pt.

## 2024-09-18 NOTE — Group Note (Signed)
 Date:  09/18/2024 Time:  11:06 AM  Group Topic/Focus:  Coping With Mental Health Crisis:   The purpose of this group is to help patients identify strategies for coping with mental health crisis.  Group discusses possible causes of crisis and ways to manage them effectively.    Participation Level:  Active  Participation Quality:  Appropriate  Affect:  Appropriate  Cognitive:  Appropriate  Insight: Appropriate  Engagement in Group:  Engaged  Modes of Intervention:  Discussion   Miguel [name] 09/18/2024, 11:06 AM

## 2024-09-19 DIAGNOSIS — F03918 Unspecified dementia, unspecified severity, with other behavioral disturbance: Secondary | ICD-10-CM | POA: Diagnosis not present

## 2024-09-19 DIAGNOSIS — F200 Paranoid schizophrenia: Secondary | ICD-10-CM | POA: Diagnosis not present

## 2024-09-19 NOTE — Plan of Care (Signed)
" °  Problem: Education: Goal: Knowledge of [facility] Education information/materials will improve Outcome: Not Progressing   Problem: Activity: Goal: Interest or engagement in activities will improve Outcome: Not Progressing   Problem: Coping: Goal: Ability to verbalize frustrations and anger appropriately will improve Outcome: Not Progressing   Problem: Education: Goal: Knowledge of [facility] Education information/materials will improve Outcome: Not Progressing   "

## 2024-09-19 NOTE — Group Note (Signed)
 Date:  09/19/2024 Time:  4:21 PM  Group Topic/Focus:  Coping With Mental Health Crisis:   The purpose of this group is to help patients identify strategies for coping with mental health crisis.  Group discusses possible causes of crisis and ways to manage them effectively.  Today I facilitated a group activity that encouraged patient participation through a Family Feud-style game. Patients answered questions focused on enjoyable activities, hobbies, and places they like to go for fun. This activity promoted discussion about healthy coping mechanisms, leisure skills, and positive habits. Patients were encouraged to reflect on fun moments in their lives, with several emphasizing holidays such as Thanksgiving and Christmas, particularly in relation to cooking and spending time with family. Patients were engaged, cooperative, and actively participated throughout the group.    Participation Level:  Active  Participation Quality:  Appropriate  Affect:  Appropriate  Cognitive:  Appropriate  Insight: Limited  Engagement in Group:  Engaged and Limited  Modes of Intervention:  Activity and Discussion  Additional Comments:    Davied Nocito L Mikala Podoll 09/19/2024, 4:21 PM

## 2024-09-19 NOTE — Group Note (Signed)
 Date:  09/19/2024 Time:  2:18 PM  Group Topic/Focus:  Coping With Mental Health Crisis:   The purpose of this group is to help patients identify strategies for coping with mental health crisis.  Group discusses possible causes of crisis and ways to manage them effectively.    Participation Level:  Did Not Attend    Miguel [name] Bias 09/19/2024, 2:18 PM

## 2024-09-19 NOTE — Group Note (Signed)
 Date:  09/19/2024 Time:  8:38 PM  Group Topic/Focus:  Coping With Mental Health Crisis:   The purpose of this group is to help patients identify strategies for coping with mental health crisis.  Group discusses possible causes of crisis and ways to manage them effectively. Healthy Communication:   The focus of this group is to discuss communication, barriers to communication, as well as healthy ways to communicate with others. Overcoming Stress:   The focus of this group is to define stress and help patients assess their triggers. Self Care:   The focus of this group is to help patients understand the importance of self-care in order to improve or restore emotional, physical, spiritual, interpersonal, and financial health.    Participation Level:  Active  Participation Quality:  Appropriate  Affect:  Appropriate  Cognitive:  Alert and Appropriate  Insight: Appropriate  Engagement in Group:  Engaged and Supportive  Modes of Intervention:  Discussion and Support  Additional Comments:  Pt did attend tonight's wrap up group. Will continue to encourage pt to attend group sessions and participate in discussions with peers.  Brad GORMAN Ryder 09/19/2024, 8:38 PM

## 2024-09-19 NOTE — Group Note (Signed)
 Date:  09/19/2024 Time:  10:05 AM  Group Topic/Focus:  Movement Therapy, Morning Stretch with Annali Lybrand.    Participation Level:  Did Not Attend    [name] 09/19/2024, 10:05 AM

## 2024-09-19 NOTE — Progress Notes (Signed)
 Patient pleasant during interaction. Accepted medications without issue. Wound care completed. Patient present in dayroom for meals and group. Ongoing monitoring continues.   09/19/24 1000  Psych Admission Type (Psych Patients Only)  Admission Status Involuntary  Psychosocial Assessment  Patient Complaints Confusion  Eye Contact Fair  Facial Expression Animated  Affect Preoccupied  Speech Tangential  Interaction Assertive  Motor Activity Slow  Appearance/Hygiene Unremarkable  Behavior Characteristics Cooperative  Mood Pleasant  Thought Process  Coherency Tangential  Content Preoccupation  Delusions None reported or observed  Perception WDL  Hallucination None reported or observed  Judgment Impaired  Confusion Mild  Danger to Self  Current suicidal ideation? Denies  Danger to Others  Danger to Others None reported or observed

## 2024-09-19 NOTE — Progress Notes (Signed)
 [facility] MD Progress Note  09/19/2024 12:57 PM Miguel [name]  MRN:  [id number]  Miguel [name]. Vos is a 74 year old male with a past psychiatric history significant for schizophrenia who presents under involuntary commitment initiated by family due to medication non-adherence, increasing paranoia, and behavioral disorganization. 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 Brother reports multiple episodes of patient whispering through the night, stating people were talking about him and planning to harm him. Patient was advised by his ACT Team to come to the ED; he refused, prompting the family to pursue IVC.Patient is admitted to [facility] unit with Q15 min safety monitoring. Multidisciplinary team approach is offered. Medication management; group/milieu therapy is offered.   Subjective:  Chart reviewed, case discussed in multidisciplinary meeting, patient seen during rounds.  Patient is noted to be walking in the hallway.  He remains disorganized.  He displays paranoia stating somebody stealing his food.  He reports that he sits in his room and by 7:15 AM the food is disappearing.  He was able to acknowledge having his breakfast today.  Per nursing staff patient remains hungry throughout the day and due to dysphagia diet his food tray does not have enough food at all.  Discussed calling dietitian consult to help with oral intake and nutrition maintenance.  He denies SI/HI/plan and denies hallucinations.  Past Psychiatric History: see h&P Family History:  Family History  Family history  unknown: Yes   Social History:  Social History   Substance and Sexual Activity  Alcohol Use No     Social History   Substance and Sexual Activity  Drug Use No    Social History   Socioeconomic History   Marital status: Divorced    Spouse name: Not on file   Number of children: Not on file   Years of education: Not on file   Highest education level: Not on file  Occupational History   Not on file  Tobacco Use   Smoking status: Every Day    Current packs/day: 0.50    Average packs/day: 0.5 packs/day for 46.1 years (23.0 ttl pk-yrs)    Types: Cigarettes    Start date: 53   Smokeless tobacco: Never  Vaping Use   Vaping status: Never Used  Substance and Sexual Activity   Alcohol use: No   Drug use: No   Sexual activity: Not on file  Other Topics Concern   Not on file  Social History Narrative   Not on file   Social Drivers of Health   Tobacco Use: High Risk (07/18/2024)   Patient History    Smoking Tobacco Use: Every Day    Smokeless Tobacco Use: Never    Passive Exposure: Not on file  Financial Resource Strain: Not on file  Food Insecurity: Patient Unable To Answer (07/18/2024)   Epic    Worried About Programme Researcher, Broadcasting/film/video in the Last Year: Patient unable to answer    Ran Out of Food in the Last Year: Patient unable to answer  Transportation Needs: Unmet Transportation Needs (07/18/2024)   Epic    Lack of Transportation (Medical): Yes    Lack of Transportation (Non-Medical): Yes  Physical Activity: Not on file  Stress:  Not on file  Social Connections: Patient Unable To Answer (07/18/2024)   Social Connection and Isolation Panel    Frequency of Communication with Friends and Family: Patient unable to answer    Frequency of Social Gatherings with Friends and Family: Patient unable to answer    Attends Religious Services: Patient unable to answer    Active Member of Clubs or Organizations: Patient unable to answer    Attends Banker Meetings:  Patient unable to answer    Marital Status: Patient unable to answer  Depression (PHQ2-9): Not on file  Alcohol Screen: Low Risk (07/18/2024)   Alcohol Screen    Last Alcohol Screening Score (AUDIT): 0  Housing: Unknown (07/18/2024)   Epic    Unable to Pay for Housing in the Last Year: Patient unable to answer    Number of Times Moved in the Last Year: 0    Homeless in the Last Year: Patient declined  Utilities: Patient Unable To Answer (07/18/2024)   Epic    Threatened with loss of utilities: Patient unable to answer  Health Literacy: Not on file   Past Medical History:  Past Medical History:  Diagnosis Date   Biventricular failure (HCC)    CVA (cerebral vascular accident) (HCC)    Polysubstance use disorder    Schizophrenia (HCC)     Past Surgical History:  Procedure Laterality Date   APPENDECTOMY     TRACHEOSTOMY TUBE PLACEMENT N/A 03/04/2024   Procedure: CREATION, TRACHEOSTOMY;  Surgeon: [name] Massie RAMAN, MD;  Location: [facility] ORS;  Service: ENT;  Laterality: N/A;    Current Medications: Current Facility-Administered Medications  Medication Dose Route Frequency Provider Last Rate Last Admin   acetaminophen  (TYLENOL ) tablet 650 mg  650 mg Oral Q6H PRN Hampton, Tracie B, NP   650 mg at 09/17/24 2221   alum & mag hydroxide-simeth (MAALOX/MYLANTA) 200-200-20 MG/5ML suspension 30 mL  30 mL Oral Q4H PRN Hampton, Tracie B, NP   30 mL at 08/05/24 0306   amiodarone  (PACERONE ) tablet 400 mg  400 mg Oral Daily [name], [name], MD   400 mg at 09/17/24 0915   ARIPiprazole  ER (ABILIFY  MAINTENA) injection 400 mg  400 mg Intramuscular Q28 days Zebedee Segundo, MD   400 mg at 08/24/24 9065   benzocaine  (ORAJEL) 10 % mucosal gel   Mouth/Throat TID PRN [name], [name], MD       divalproex  (DEPAKOTE  ER) 24 hr tablet 1,000 mg  1,000 mg Oral QHS Ahleah Simko, MD   1,000 mg at 09/18/24 2126   docusate sodium  (COLACE) capsule 100 mg  100 mg Oral Daily Octavie Westerhold, MD   100 mg at 09/19/24 9078    feeding supplement (ENSURE PLUS HIGH PROTEIN) liquid 237 mL  237 mL Oral TID BM [name], [name], MD   237 mL at 09/19/24 9077   hydrOXYzine  (ATARAX ) tablet 25 mg  25 mg Oral TID PRN Hampton, Tracie B, NP   25 mg at 09/18/24 2126   magnesium  hydroxide (MILK OF MAGNESIA) suspension 30 mL  30 mL Oral Daily PRN Hampton, Tracie B, NP   30 mL at 08/08/24 2233   memantine  (NAMENDA ) tablet 10 mg  10 mg Oral Daily Darica Goren, MD   10 mg at 09/19/24 9078   metoprolol  tartrate (LOPRESSOR ) tablet 12.5 mg  12.5 mg Oral BID [name], [name], MD   12.5 mg at 09/17/24 2128   naproxen  (NAPROSYN ) tablet 250 mg  250 mg Oral BID WC Saddie Sandeen, MD  250 mg at 09/19/24 9241   OLANZapine  (ZYPREXA ) injection 5 mg  5 mg Intramuscular TID PRN Hampton, Tracie B, NP       OLANZapine  zydis (ZYPREXA ) disintegrating tablet 5 mg  5 mg Oral TID PRN Hampton, Tracie B, NP   5 mg at 08/04/24 1129   oxyCODONE  (Oxy IR/ROXICODONE ) immediate release tablet 5 mg  5 mg Oral Q4H PRN Dorinda Homans T, MD   5 mg at 09/01/24 2102   pantoprazole  (PROTONIX ) EC tablet 40 mg  40 mg Oral Daily [name], [name], MD   40 mg at 09/19/24 9078   sodium chloride  (OCEAN) 0.65 % nasal spray 1 spray  1 spray Each Nare PRN Donnelly Mellow, MD   1 spray at 08/18/24 1459   traZODone  (DESYREL ) tablet 50 mg  50 mg Oral QHS PRN Hampton, Tracie B, NP   50 mg at 09/18/24 2126    Lab Results:  No results found for this or any previous visit (from the past 48 hours).     Blood Alcohol level:  Lab Results  Component Value Date   [facility] <15 07/17/2024   ETH <15 02/20/2024    Metabolic Disorder Labs: Lab Results  Component Value Date   HGBA1C 5.4 08/12/2024   MPG 108.28 08/12/2024   MPG 119.76 07/14/2018   No results found for: PROLACTIN Lab Results  Component Value Date   CHOL 139 08/12/2024   TRIG 73 08/12/2024   HDL 52 08/12/2024   CHOLHDL 2.7 08/12/2024   VLDL 15 08/12/2024   LDLCALC 72 08/12/2024   LDLCALC 119 (H) 07/13/2018     Physical Findings: AIMS:  , ,  ,  ,    CIWA:    COWS:      Psychiatric Specialty Exam:  Presentation  General Appearance: Appropriate for Environment  Eye Contact:Fleeting  Speech:Normal Rate  Speech Volume:Decreased    Mood and Affect  Mood:Anxious  Affect:Flat   Thought Process  Thought Processes:circumstantial Orientation:Partial  Thought Content:discharge focused  Hallucinations:denies  Ideas of Reference:Delusions At baseline Suicidal Thoughts:denies  Homicidal Thoughts:denies   Sensorium  Memory:Immediate Poor; Recent Poor; Remote Poor  Judgment:Impaired  Insight:None   Executive Functions  Concentration:Poor  Attention Span:Poor  Recall:Poor  Fund of Knowledge:Poor  Language:Fair   Psychomotor Activity  Psychomotor Activity:No data recorded  Musculoskeletal: Strength & Muscle Tone: within normal limits Gait & Station: normal Assets  Assets:Communication Skills; Desire for Improvement; Social Support    Physical Exam: Physical Exam Vitals and nursing note reviewed.    ROS Blood pressure (!) 104/53, pulse 74, temperature 98.1 F (36.7 C), resp. rate 17, height 6' 1 (1.854 m), weight 81.2 kg, SpO2 98%. Body mass index is 23.62 kg/m.  Diagnosis: Active Problems:   Schizophrenia, paranoid (HCC)   Dementia with behavioral disturbance (HCC)   PLAN: Safety and Monitoring:  -- Involuntary admission to inpatient psychiatric unit for safety, stabilization and treatment  -- Daily contact with patient to assess and evaluate symptoms and progress in treatment  -- Patient's case to be discussed in multi-disciplinary team meeting  -- Observation Level : q15 minute checks  -- Vital signs:  q12 hours  -- Precautions: suicide, elopement, and assault -- Encouraged patient to participate in unit milieu and in scheduled group therapies  2. Psychiatric Treatment:  Scheduled Medications:  Discontinued oral Abilify  LAY Abilify   Maintena 400 mg q4 weeks given on 07/27/24, 08/24/2024  Cogentin  1mg  one dose given for EPS Depakote  ER increased to 1000 mg to help with  mood stabilization  Depakote  levels 09/02/24: 57  -- The risks/benefits/side-effects/alternatives to this medication were discussed in detail with the patient and time was given for questions. The patient consents to medication trial.  3. Medical Issues Being Addressed:  [facility] consulted for wound care management   Assessment and Plan:   Sacral Decubitus Ulcer Full thickness decubitus ulcer in setting of recent extended hospitalization, discharged from [facility] 14[facility] Wound looks pretty good overall WOC on board we appreciate input   Cognitive Deficit Concern for anoxic brain injury at discharge 02/2024 Delirium precautions   Peripheral Vascular Disease Legs with features c/w peripheral vascular disease, diminished DP pulses Would benefit from outpatient ABI's   HFrEF Euvolemic at this time Continue home medications   Hx Atrial Flutter Not on any anticoagulation at this time EKG with NSR Continue amiodarone   Hypertension Noted, adjust meds gradually after med rec complete   Tooth pain/Infection - Added: Augmentin  along with Orajel BP -better today no longer hypotensive-Will continue reduced dose of metoprolol  12.5 bid.   4. Discharge Planning:   -- Social work and case management to assist with discharge planning and identification of hospital follow-up needs prior to discharge  -- Estimated LOS: 3-4 days  Allyn Foil, MD 09/19/2024, 12:57 PM

## 2024-09-20 NOTE — Progress Notes (Signed)
Wound care performed as per order

## 2024-09-20 NOTE — Group Note (Signed)
 Date:  09/20/2024 Time:  8:55 PM  Group Topic/Focus:  Wrap-Up Group:   The focus of this group is to help patients review their daily goal of treatment and discuss progress on daily workbooks.    Participation Level:  Did Not Attend  Participation Quality:     Affect:     Cognitive:     Insight: None  Engagement in Group:  None  Modes of Intervention:     Additional Comments:    [name] 09/20/2024, 8:55 PM

## 2024-09-20 NOTE — Progress Notes (Signed)
 Patient was cooperative and pleasant during interaction with staff and peers. Participated in the wrap up session. Denies SI/HI/AVH. Tolerated medications without any issue. Slept for 5.25 hrs.     09/19/24 2200  Psych Admission Type (Psych Patients Only)  Admission Status Involuntary  Psychosocial Assessment  Patient Complaints Confusion  Eye Contact Fair  Facial Expression Animated  Affect Preoccupied  Speech Tangential  Interaction Assertive  Motor Activity Slow  Appearance/Hygiene Unremarkable  Behavior Characteristics Cooperative  Mood Pleasant  Thought Process  Coherency Tangential  Content Preoccupation  Delusions None reported or observed  Perception WDL  Hallucination None reported or observed  Judgment Impaired  Confusion Mild  Danger to Self  Current suicidal ideation? Denies  Danger to Others  Danger to Others None reported or observed

## 2024-09-20 NOTE — Progress Notes (Signed)
" °   09/20/24 1117  Psych Admission Type (Psych Patients Only)  Admission Status Involuntary  Psychosocial Assessment  Patient Complaints Confusion  Eye Contact Fair  Facial Expression Other (Comment) (WDL)  Affect Labile  Speech Unremarkable  Interaction Other (Comment) (WDL)  Motor Activity Slow  Appearance/Hygiene Unremarkable  Behavior Characteristics Cooperative  Mood Pleasant  Thought Process  Coherency Tangential  Content Preoccupation;Delusions  Delusions Referential  Perception WDL  Hallucination None reported or observed  Judgment Impaired  Confusion Mild  Danger to Self  Current suicidal ideation? Denies  Danger to Others  Danger to Others None reported or observed    "

## 2024-09-20 NOTE — Group Note (Signed)
 Date:  09/20/2024 Time:  10:40 AM  Group Topic/Focus:  Coping With Mental Health Crisis:   The purpose of this group is to help patients identify strategies for coping with mental health crisis.  Group discusses possible causes of crisis and ways to manage them effectively.  I led a group session focused on engagement, communication, and positive coping skills. The session began with an icebreaker activity, Two Truths and One Lie, which allowed participants the opportunity to learn more about one another and build rapport in a supportive environment.  I then guided the group through Would You Rather and What Would You Do activities to encourage critical thinking, decision-making, and discussion of real-life situations. Participants were prompted to explore positive perspectives, problem-solving strategies, and healthy responses to challenging circumstances.  Group members were engaged throughout the session, demonstrated appropriate interactions, and actively participated in discussions. Overall, the group showed cooperation, insight, and openness to identifying positive ways to approach difficult situations.   Participation Level:  Active  Participation Quality:  Appropriate  Affect:  Appropriate  Cognitive:  Appropriate  Insight: Limited  Engagement in Group:  Limited  Modes of Intervention:  Activity and Discussion  Additional Comments:    Chastity Noland L Floyce Bujak 09/20/2024, 10:40 AM

## 2024-09-20 NOTE — Plan of Care (Signed)
" °  Problem: Education: Goal: Knowledge of [facility] Education information/materials will improve Outcome: Progressing   Problem: Education: Goal: Knowledge of [place] [facility] [facility] Education information/materials will improve Outcome: Progressing   Problem: Activity: Goal: Interest or engagement in activities will improve Outcome: Progressing   Problem: Coping: Goal: Ability to verbalize frustrations and anger appropriately will improve Outcome: Progressing   Problem: Education: Goal: Knowledge of  General Education information/materials will improve Outcome: Progressing Goal: Emotional status will improve Outcome: Progressing Goal: Mental status will improve Outcome: Progressing Goal: Verbalization of understanding the information provided will improve Outcome: Progressing   "

## 2024-09-20 NOTE — Progress Notes (Signed)
 [facility] MD Progress Note  09/20/2024 7:21 PM Miguel [name]  MRN:  [id number]  Miguel [name] is a 74 year old male with a past psychiatric history significant for schizophrenia who presents under involuntary commitment initiated by family due to medication non-adherence, increasing paranoia, and behavioral disorganization. 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 Brother reports multiple episodes of patient whispering through the night, stating people were talking about him and planning to harm him. Patient was advised by his ACT Team to come to the ED; he refused, prompting the family to pursue IVC.Patient is admitted to [facility] unit with Q15 min safety monitoring. Multidisciplinary team approach is offered. Medication management; group/milieu therapy is offered.   Subjective:  Chart reviewed, case discussed in multidisciplinary meeting, patient seen during rounds.  Patient is noted to be walking in the hallway.  He remains disorganized and confused about his hospitalization currently.  He denies SI/HI/plan and denies hallucinations.  He talks about some of the staff members taking away his food but later on confirms that he had his breakfast.  Past Psychiatric History: see h&P Family History:  Family History  Family history unknown: Yes   Social History:  Social History   Substance and Sexual Activity  Alcohol Use No     Social History   Substance and Sexual Activity  Drug Use No    Social History   Socioeconomic History   Marital status: Divorced    Spouse  name: Not on file   Number of children: Not on file   Years of education: Not on file   Highest education level: Not on file  Occupational History   Not on file  Tobacco Use   Smoking status: Every Day    Current packs/day: 0.50    Average packs/day: 0.5 packs/day for 46.1 years (23.0 ttl pk-yrs)    Types: Cigarettes    Start date: 43   Smokeless tobacco: Never  Vaping Use   Vaping status: Never Used  Substance and Sexual Activity   Alcohol use: No   Drug use: No   Sexual activity: Not on file  Other Topics Concern   Not on file  Social History Narrative   Not on file   Social Drivers of Health   Tobacco Use: High Risk (07/18/2024)   Patient History    Smoking Tobacco Use: Every Day    Smokeless Tobacco Use: Never    Passive Exposure: Not on file  Financial Resource Strain: Not on file  Food Insecurity: Patient Unable To Answer (07/18/2024)   Epic    Worried About Programme Researcher, Broadcasting/film/video in the Last Year: Patient unable to answer    Ran Out of Food in the Last Year: Patient unable to answer  Transportation Needs: Unmet Transportation Needs (07/18/2024)   Epic    Lack of Transportation (Medical): Yes    Lack of Transportation (Non-Medical): Yes  Physical Activity: Not on file  Stress: Not on file  Social Connections: Patient Unable To Answer (07/18/2024)   Social Connection and Isolation Panel    Frequency of Communication with Friends and Family: Patient unable to answer    Frequency of Social Gatherings with Friends and Family: Patient unable to  answer    Attends Religious Services: Patient unable to answer    Active Member of Clubs or Organizations: Patient unable to answer    Attends Club or Organization Meetings: Patient unable to answer    Marital Status: Patient unable to answer  Depression (PHQ2-9): Not on file  Alcohol Screen: Low Risk (07/18/2024)   Alcohol Screen    Last Alcohol Screening Score (AUDIT): 0  Housing: Unknown (07/18/2024)   Epic    Unable  to Pay for Housing in the Last Year: Patient unable to answer    Number of Times Moved in the Last Year: 0    Homeless in the Last Year: Patient declined  Utilities: Patient Unable To Answer (07/18/2024)   Epic    Threatened with loss of utilities: Patient unable to answer  Health Literacy: Not on file   Past Medical History:  Past Medical History:  Diagnosis Date   Biventricular failure (HCC)    CVA (cerebral vascular accident) (HCC)    Polysubstance use disorder    Schizophrenia (HCC)     Past Surgical History:  Procedure Laterality Date   APPENDECTOMY     TRACHEOSTOMY TUBE PLACEMENT N/A 03/04/2024   Procedure: CREATION, TRACHEOSTOMY;  Surgeon: [name] Massie RAMAN, MD;  Location: [facility] ORS;  Service: ENT;  Laterality: N/A;    Current Medications: Current Facility-Administered Medications  Medication Dose Route Frequency Provider Last Rate Last Admin   acetaminophen  (TYLENOL ) tablet 650 mg  650 mg Oral Q6H PRN Hampton, Tracie B, NP   650 mg at 09/17/24 2221   alum & mag hydroxide-simeth (MAALOX/MYLANTA) 200-200-20 MG/5ML suspension 30 mL  30 mL Oral Q4H PRN Hampton, Tracie B, NP   30 mL at 08/05/24 0306   amiodarone  (PACERONE ) tablet 400 mg  400 mg Oral Daily [name], [name], MD   400 mg at 09/20/24 0946   ARIPiprazole  ER (ABILIFY  MAINTENA) injection 400 mg  400 mg Intramuscular Q28 days Hayde Kilgour, MD   400 mg at 08/24/24 0934   benzocaine  (ORAJEL) 10 % mucosal gel   Mouth/Throat TID PRN [name], [name], MD       divalproex  (DEPAKOTE  ER) 24 hr tablet 1,000 mg  1,000 mg Oral QHS Anton Cheramie, MD   1,000 mg at 09/19/24 2124   docusate sodium  (COLACE) capsule 100 mg  100 mg Oral Daily Cyrena Kuchenbecker, MD   100 mg at 09/20/24 0945   feeding supplement (ENSURE PLUS HIGH PROTEIN) liquid 237 mL  237 mL Oral TID BM [name], [name], MD   237 mL at 09/20/24 1429   hydrOXYzine  (ATARAX ) tablet 25 mg  25 mg Oral TID PRN Hampton, Tracie B, NP   25 mg at 09/18/24 2126   magnesium  hydroxide  (MILK OF MAGNESIA) suspension 30 mL  30 mL Oral Daily PRN Hampton, Tracie B, NP   30 mL at 08/08/24 2233   memantine  (NAMENDA ) tablet 10 mg  10 mg Oral Daily Anuj Summons, MD   10 mg at 09/20/24 9053   metoprolol  tartrate (LOPRESSOR ) tablet 12.5 mg  12.5 mg Oral BID [name], [name], MD   12.5 mg at 09/20/24 0945   naproxen  (NAPROSYN ) tablet 250 mg  250 mg Oral BID WC Sahiba Granholm, MD   250 mg at 09/20/24 1636   OLANZapine  (ZYPREXA ) injection 5 mg  5 mg Intramuscular TID PRN Hampton, Tracie B, NP       OLANZapine  zydis (ZYPREXA ) disintegrating tablet 5 mg  5 mg Oral TID PRN Hampton, Tracie B, NP  5 mg at 08/04/24 1129   oxyCODONE  (Oxy IR/ROXICODONE ) immediate release tablet 5 mg  5 mg Oral Q4H PRN Dorinda Homans T, MD   5 mg at 09/01/24 2102   pantoprazole  (PROTONIX ) EC tablet 40 mg  40 mg Oral Daily [name], [name], MD   40 mg at 09/20/24 0946   sodium chloride  (OCEAN) 0.65 % nasal spray 1 spray  1 spray Each Nare PRN Dawit Tankard, MD   1 spray at 08/18/24 1459   traZODone  (DESYREL ) tablet 50 mg  50 mg Oral QHS PRN Hampton, Tracie B, NP   50 mg at 09/19/24 2123    Lab Results:  No results found for this or any previous visit (from the past 48 hours).     Blood Alcohol level:  Lab Results  Component Value Date   [facility] <15 07/17/2024   ETH <15 02/20/2024    Metabolic Disorder Labs: Lab Results  Component Value Date   HGBA1C 5.4 08/12/2024   MPG 108.28 08/12/2024   MPG 119.76 07/14/2018   No results found for: PROLACTIN Lab Results  Component Value Date   CHOL 139 08/12/2024   TRIG 73 08/12/2024   HDL 52 08/12/2024   CHOLHDL 2.7 08/12/2024   VLDL 15 08/12/2024   LDLCALC 72 08/12/2024   LDLCALC 119 (H) 07/13/2018    Physical Findings: AIMS:  , ,  ,  ,    CIWA:    COWS:      Psychiatric Specialty Exam:  Presentation  General Appearance: Appropriate for Environment  Eye Contact:Fleeting  Speech:Normal Rate  Speech Volume:Decreased    Mood and  Affect  Mood:Anxious  Affect:Flat   Thought Process  Thought Processes:circumstantial Orientation:Partial  Thought Content:discharge focused  Hallucinations:denies  Ideas of Reference:Delusions At baseline Suicidal Thoughts:denies  Homicidal Thoughts:denies   Sensorium  Memory:Immediate Poor; Recent Poor; Remote Poor  Judgment:Impaired  Insight:None   Executive Functions  Concentration:Poor  Attention Span:Poor  Recall:Poor  Fund of Knowledge:Poor  Language:Fair   Psychomotor Activity  Psychomotor Activity:No data recorded  Musculoskeletal: Strength & Muscle Tone: within normal limits Gait & Station: normal Assets  Assets:Communication Skills; Desire for Improvement; Social Support    Physical Exam: Physical Exam Vitals and nursing note reviewed.    ROS Blood pressure 128/67, pulse (!) 56, temperature (!) 97.5 F (36.4 C), resp. rate 16, height 6' 1 (1.854 m), weight 81.2 kg, SpO2 98%. Body mass index is 23.62 kg/m.  Diagnosis: Active Problems:   Schizophrenia, paranoid (HCC)   Dementia with behavioral disturbance (HCC)   PLAN: Safety and Monitoring:  -- Involuntary admission to inpatient psychiatric unit for safety, stabilization and treatment  -- Daily contact with patient to assess and evaluate symptoms and progress in treatment  -- Patient's case to be discussed in multi-disciplinary team meeting  -- Observation Level : q15 minute checks  -- Vital signs:  q12 hours  -- Precautions: suicide, elopement, and assault -- Encouraged patient to participate in unit milieu and in scheduled group therapies  2. Psychiatric Treatment:  Scheduled Medications:  Discontinued oral Abilify  LAY Abilify  Maintena 400 mg q4 weeks given on 07/27/24, 08/24/2024  Cogentin  1mg  one dose given for EPS Depakote  ER increased to 1000 mg to help with mood stabilization  Depakote  levels 09/02/24: 57  -- The risks/benefits/side-effects/alternatives to this  medication were discussed in detail with the patient and time was given for questions. The patient consents to medication trial.  3. Medical Issues Being Addressed:  [facility] consulted for wound care management  Assessment and Plan:   Sacral Decubitus Ulcer Full thickness decubitus ulcer in setting of recent extended hospitalization, discharged [facility] Wound looks pretty good overall WOC on board we appreciate input   Cognitive Deficit Concern for anoxic brain injury at discharge 02/2024 Delirium precautions   Peripheral Vascular Disease Legs with features c/w peripheral vascular disease, diminished DP pulses Would benefit from outpatient ABI's   HFrEF Euvolemic at this time Continue home medications   Hx Atrial Flutter Not on any anticoagulation at this time EKG with NSR Continue amiodarone   Hypertension Noted, adjust meds gradually after med rec complete   Tooth pain/Infection - Added: Augmentin  along with Orajel BP -better today no longer hypotensive-Will continue reduced dose of metoprolol  12.5 bid.   4. Discharge Planning:   -- Social work and case management to assist with discharge planning and identification of hospital follow-up needs prior to discharge  -- Estimated LOS: 3-4 days  Allyn Foil, MD 09/20/2024, 7:21 PM

## 2024-09-20 NOTE — Plan of Care (Signed)
   Problem: Activity: Goal: Interest or engagement in activities will improve Outcome: Progressing   Problem: Coping: Goal: Ability to verbalize frustrations and anger appropriately will improve Outcome: Progressing

## 2024-09-21 DIAGNOSIS — F03918 Unspecified dementia, unspecified severity, with other behavioral disturbance: Secondary | ICD-10-CM | POA: Diagnosis not present

## 2024-09-21 DIAGNOSIS — F200 Paranoid schizophrenia: Secondary | ICD-10-CM | POA: Diagnosis not present

## 2024-09-21 NOTE — Group Note (Signed)
 Date:  09/21/2024 Time:  9:34 PM  Group Topic/Focus:  Wrap-Up Group:   The focus of this group is to help patients review their daily goal of treatment and discuss progress on daily workbooks.    Participation Level:  Did Not Attend  Participation Quality:     Affect:     Cognitive:     Insight: None  Engagement in Group:  None  Modes of Intervention:     Additional Comments:    [name] 09/21/2024, 9:34 PM

## 2024-09-21 NOTE — Group Note (Signed)
 Date:  09/21/2024 Time:  9:52 AM  Group Topic/Focus:  Movement Therapy.    Participation Level:  Did Not Attend    [name] 09/21/2024, 9:52 AM

## 2024-09-21 NOTE — BH IP Treatment Plan (Signed)
 Interdisciplinary Treatment and Diagnostic Plan Update  09/21/2024 Time of Session: 9:00 AM  Miguel [name] MRN: [id number]  Principal Diagnosis: <principal problem not specified>  Secondary Diagnoses: Active Problems:   Schizophrenia, paranoid (HCC)   Dementia with behavioral disturbance (HCC)   Current Medications:  Current Facility-Administered Medications  Medication Dose Route Frequency Provider Last Rate Last Admin   acetaminophen  (TYLENOL ) tablet 650 mg  650 mg Oral Q6H PRN Hampton, Tracie B, NP   650 mg at 09/17/24 2221   alum & mag hydroxide-simeth (MAALOX/MYLANTA) 200-200-20 MG/5ML suspension 30 mL  30 mL Oral Q4H PRN Hampton, Tracie B, NP   30 mL at 08/05/24 9693   amiodarone  (PACERONE ) tablet 400 mg  400 mg Oral Daily [name], [name], MD   400 mg at 09/20/24 0946   ARIPiprazole  ER (ABILIFY  MAINTENA) injection 400 mg  400 mg Intramuscular Q28 days [name], [name], MD   400 mg at 09/21/24 9080   benzocaine  (ORAJEL) 10 % mucosal gel   Mouth/Throat TID PRN [name], [name], MD       divalproex  (DEPAKOTE  ER) 24 hr tablet 1,000 mg  1,000 mg Oral QHS [name], [name], MD   1,000 mg at 09/20/24 2143   docusate sodium  (COLACE) capsule 100 mg  100 mg Oral Daily [name], [name], MD   100 mg at 09/21/24 9092   feeding supplement (ENSURE PLUS HIGH PROTEIN) liquid 237 mL  237 mL Oral TID BM [name], [name], MD   237 mL at 09/21/24 0908   hydrOXYzine  (ATARAX ) tablet 25 mg  25 mg Oral TID PRN Hampton, Tracie B, NP   25 mg at 09/18/24 2126   magnesium  hydroxide (MILK OF MAGNESIA) suspension 30 mL  30 mL Oral Daily PRN Hampton, Tracie B, NP   30 mL at 08/08/24 2233   memantine  (NAMENDA ) tablet 10 mg  10 mg Oral Daily [name], [name], MD   10 mg at 09/21/24 0907   metoprolol  tartrate (LOPRESSOR ) tablet 12.5 mg  12.5 mg Oral BID [name], [name], MD   12.5 mg at 09/20/24 2137   naproxen  (NAPROSYN ) tablet 250 mg  250 mg Oral BID WC [name], [name], MD   250 mg at 09/21/24 9092    OLANZapine  (ZYPREXA ) injection 5 mg  5 mg Intramuscular TID PRN Hampton, Tracie B, NP       OLANZapine  zydis (ZYPREXA ) disintegrating tablet 5 mg  5 mg Oral TID PRN Hampton, Tracie B, NP   5 mg at 08/04/24 1129   oxyCODONE  (Oxy IR/ROXICODONE ) immediate release tablet 5 mg  5 mg Oral Q4H PRN Dorinda Homans T, MD   5 mg at 09/01/24 2102   pantoprazole  (PROTONIX ) EC tablet 40 mg  40 mg Oral Daily [name], [name], MD   40 mg at 09/21/24 9092   sodium chloride  (OCEAN) 0.65 % nasal spray 1 spray  1 spray Each Nare PRN [name], [name], MD   1 spray at 08/18/24 1459   traZODone  (DESYREL ) tablet 50 mg  50 mg Oral QHS PRN Hampton, Tracie B, NP   50 mg at 09/19/24 2123   PTA Medications: Medications Prior to Admission  Medication Sig Dispense Refill Last Dose/Taking   amiodarone  (PACERONE ) 400 MG tablet Place 1 tablet (400 mg total) into feeding tube daily.      ARIPiprazole  (ABILIFY ) 30 MG tablet Place 1 tablet (30 mg total) into feeding tube at bedtime.      bisacodyl  (DULCOLAX) 10 MG suppository Place 1 suppository (10 mg total) rectally daily as  needed for moderate constipation or severe constipation.      busPIRone  (BUSPAR ) 5 MG tablet Take 5 mg by mouth 3 (three) times daily.      Chlorhexidine  Gluconate Cloth 2 % PADS Apply 6 each topically daily.      digoxin  (LANOXIN ) 0.125 MG tablet 1 tablet (0.125 mg total) by Per NG tube route daily. (Patient taking differently: Take 0.125 mg by mouth every other day.)      docusate (COLACE) 50 MG/5ML liquid Place 10 mLs (100 mg total) into feeding tube 2 (two) times daily.      metoprolol  tartrate (LOPRESSOR ) 25 MG tablet Take 25 mg by mouth 2 (two) times daily.      pantoprazole  (PROTONIX ) 40 MG tablet Take 40 mg by mouth daily.      traMADol  (ULTRAM ) 50 MG tablet Take 50 mg by mouth 2 (two) times daily.       Patient Stressors: Educational concerns   Health problems   Medication change or noncompliance   Other: Stated transitional housing concerns     Patient Strengths: Manufacturing systems engineer  Religious Affiliation   Treatment Modalities: Medication Management, Group therapy, Case management,  1 to 1 session with clinician, Psychoeducation, Recreational therapy.   Physician Treatment Plan for Primary Diagnosis: <principal problem not specified> Long Term Goal(s):     Short Term Goals:    Medication Management: Evaluate patient's response, side effects, and tolerance of medication regimen.  Therapeutic Interventions: 1 to 1 sessions, Unit Group sessions and Medication administration.  Evaluation of Outcomes: Adequate for Discharge  Physician Treatment Plan for Secondary Diagnosis: Active Problems:   Schizophrenia, paranoid (HCC)   Dementia with behavioral disturbance (HCC)  Long Term Goal(s):     Short Term Goals:       Medication Management: Evaluate patient's response, side effects, and tolerance of medication regimen.  Therapeutic Interventions: 1 to 1 sessions, Unit Group sessions and Medication administration.  Evaluation of Outcomes: Adequate for Discharge   RN Treatment Plan for Primary Diagnosis: <principal problem not specified> Long Term Goal(s): Knowledge of disease and therapeutic regimen to maintain health will improve  Short Term Goals: Ability to remain free from injury will improve, Ability to verbalize frustration and anger appropriately will improve, Ability to demonstrate self-control, Ability to participate in decision making will improve, Ability to verbalize feelings will improve, Ability to disclose and discuss suicidal ideas, Ability to identify and develop effective coping behaviors will improve, and Compliance with prescribed medications will improve  Medication Management: RN will administer medications as ordered by provider, will assess and evaluate patient's response and provide education to patient for prescribed medication. RN will report any adverse and/or side effects to prescribing  provider.  Therapeutic Interventions: 1 on 1 counseling sessions, Psychoeducation, Medication administration, Evaluate responses to treatment, Monitor vital signs and CBGs as ordered, Perform/monitor CIWA, COWS, AIMS and Fall Risk screenings as ordered, Perform wound care treatments as ordered.  Evaluation of Outcomes: Adequate for Discharge   LCSW Treatment Plan for Primary Diagnosis: <principal problem not specified> Long Term Goal(s): Safe transition to appropriate next level of care at discharge, Engage patient in therapeutic group addressing interpersonal concerns.  Short Term Goals: Engage patient in aftercare planning with referrals and resources, Increase social support, Increase ability to appropriately verbalize feelings, Increase emotional regulation, Facilitate acceptance of mental health diagnosis and concerns, Facilitate patient progression through stages of change regarding substance use diagnoses and concerns, Identify triggers associated with mental health/substance abuse issues, and Increase skills for wellness  and recovery  Therapeutic Interventions: Assess for all discharge needs, 1 to 1 time with Child psychotherapist, Explore available resources and support systems, Assess for adequacy in community support network, Educate family and significant other(s) on suicide prevention, Complete Psychosocial Assessment, Interpersonal group therapy.  Evaluation of Outcomes: Adequate for Discharge   Progress in Treatment: Attending groups: Yes. Participating in groups: Yes. Taking medication as prescribed: Yes. Toleration medication: Yes. Family/Significant other contact made: Yes, individual(s) contacted:  SPE completed with the patient's brother Patient understands diagnosis: No. Discussing patient identified problems/goals with staff: Yes. Medical problems stabilized or resolved: Yes. Denies suicidal/homicidal ideation: Yes. Issues/concerns per patient self-inventory: No. Other:  none   New problem(s) identified: No, Describe:  None Update 07/26/24: No changes at this time Update 08/01/24: No changes at this time  Update 08/06/24: No changes at this time 08/11/24 Update: No changes at this time.  Update 08/16/24: No changes at this time. Update 12/262025:  No changes at this time. Update 08/26/2024:  No changes at this time. Update 08/31/2024:  No changes at this time. Update: 09/05/2024  Update 09/10/2024:  No changes at this time. Update 09/15/2024: No changes at this time. Update 09/21/2024: No changes at this time.   New Short Term/Long Term Goal(s):detox, elimination of symptoms of psychosis, medication management for mood stabilization; elimination of SI thoughts; development of comprehensive mental wellness/sobriety plan.  Update 07/26/24: No changes at this time Update 08/01/24: No changes at this time Update 08/06/24: No changes at this time  10/13/23 Update: No changes at this time. Update 08/16/24: No changes at this time.  Update 12/262025:  No changes at this time. Update 08/26/2024:  No changes at this time. Update 08/31/2024:  No changes at this time. Update:09/05/2024 no changes  Update 09/10/2024:  No changes at this time. Update 09/15/2024: No changes at this time. Update 09/21/2024: No changes at this time.     Patient Goals:  detox, elimination of symptoms of psychosis, medication management for mood stabilization; elimination of SI thoughts; development of comprehensive mental wellness/sobriety plan.  Update 07/26/24: No changes at this time Update 08/01/24: No changes at this time Update 08/06/24: No changes at this time  10/13/23 Update: No changes at this time. Update 08/16/24: No changes at this time.  Update 12/262025:  No changes at this time. Update 08/26/2024:  No changes at this time. Update 08/31/2024:  No changes at this time. 09/05/2024: no changes Update 09/10/2024:  No changes at this time.  Update 09/15/2024: No changes at this time. Update 09/21/2024: No changes  at this time.     Discharge Plan or Barriers: CSW to assist with the development of appropriate discharge plan.  Update 07/26/24: No changes at this time Update 08/01/24: APS Report made for pt, screened it. Update 08/06/24: FL2 sent to [facility]. Pt is not placement. [facility] and APS to assist with placement. 08/11/24 Update: CSW to continue to support [facility] in working toward placement for patient. Update 08/16/24: No changes at this time. Update 12/262025:  No changes at this time. Update 08/26/2024:  No changes at this time. Update 08/31/2024:  Pt awarded interim guardianship at this time, guardianship court date scheduled for 09/07/2024.  [facility] report no updates on placement at this time. 09/05/2024 no changes  Update 09/10/2024:  No changes at this time.  Update 09/15/2024: Placement continues to be a barrier for this patient. Update 09/21/2024: Pt accepted to [facility], CSW awaiting facility to inform CSW when pt can  transition     Reason for Continuation of Hospitalization: Aggression Anxiety Delusions  Depression Hallucinations   Estimated Length of Stay:TBD  Update 09/10/2024:  TBD Update 09/15/2024: No changes at this time. Update 09/21/2024:TBD    Last 3 [place] Suicide Severity Risk Score: Flowsheet Row Admission (Current) from 07/18/2024 in [facility] ED from 07/17/2024 in [facility] Department at [facility] ED to Hosp-Admission (Discharged) from 02/20/2024 in [facility]CCU  C-SSRS RISK CATEGORY No Risk No Risk No Risk    Last PHQ 2/9 Scores:     No data to display          Scribe for Treatment Team: [name], [place] 09/21/2024 12:22 PM

## 2024-09-21 NOTE — Progress Notes (Signed)
 [facility] MD Progress Note  09/21/2024 1:11 PM Miguel [name]  MRN:  [id number]  Miguel [name] is a 74 year old male with a past psychiatric history significant for schizophrenia who presents under involuntary commitment initiated by family due to medication non-adherence, increasing paranoia, and behavioral disorganization. 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 Brother reports multiple episodes of patient whispering through the night, stating people were talking about him and planning to harm him. Patient was advised by his ACT Team to come to the ED; he refused, prompting the family to pursue IVC.Patient is admitted to [facility] unit with Q15 min safety monitoring. Multidisciplinary team approach is offered. Medication management; group/milieu therapy is offered.   Subjective:  Chart reviewed, case discussed in multidisciplinary meeting, patient seen during rounds.  Patient is noted to be sitting in a chair and dozing off.  Per nursing report patient blood pressure and heart rate has been low today.  Patient is encouraged to maintain his hydration.  Some of his blood pressure medications were put on hold until his blood pressure improves.  Patient denies SI/HI/plan.  He continues to display paranoia by talking about other patients and staff members not treating him all right.  Per nursing patient is not displaying any behavioral problems.  Past Psychiatric History: see h&P Family History:  Family History  Family history unknown: Yes   Social History:  Social History    Substance and Sexual Activity  Alcohol Use No     Social History   Substance and Sexual Activity  Drug Use No    Social History   Socioeconomic History   Marital status: Divorced    Spouse name: Not on file   Number of children: Not on file   Years of education: Not on file   Highest education level: Not on file  Occupational History   Not on file  Tobacco Use   Smoking status: Every Day    Current packs/day: 0.50    Average packs/day: 0.5 packs/day for 46.1 years (23.0 ttl pk-yrs)    Types: Cigarettes    Start date: 71   Smokeless tobacco: Never  Vaping Use   Vaping status: Never Used  Substance and Sexual Activity   Alcohol use: No   Drug use: No   Sexual activity: Not on file  Other Topics Concern   Not on file  Social History Narrative   Not on file   Social Drivers of Health   Tobacco Use: High Risk (07/18/2024)   Patient History    Smoking Tobacco Use: Every Day    Smokeless Tobacco Use: Never    Passive Exposure: Not on file  Financial Resource Strain: Not on file  Food Insecurity: Patient Unable To Answer (07/18/2024)   Epic    Worried About Programme Researcher, Broadcasting/film/video in the Last Year: Patient unable to answer    Ran Out of Food in the Last Year: Patient unable to answer  Transportation Needs: Unmet Transportation Needs (07/18/2024)   Epic    Lack of Transportation (Medical): Yes    Lack of Transportation (Non-Medical): Yes  Physical Activity: Not on file  Stress: Not on file  Social Connections: Patient Unable To Answer (07/18/2024)  Social Connection and Isolation Panel    Frequency of Communication with Friends and Family: Patient unable to answer    Frequency of Social Gatherings with Friends and Family: Patient unable to answer    Attends Religious Services: Patient unable to answer    Active Member of Clubs or Organizations: Patient unable to answer    Attends Banker Meetings: Patient unable to answer    Marital Status: Patient  unable to answer  Depression (PHQ2-9): Not on file  Alcohol Screen: Low Risk (07/18/2024)   Alcohol Screen    Last Alcohol Screening Score (AUDIT): 0  Housing: Unknown (07/18/2024)   Epic    Unable to Pay for Housing in the Last Year: Patient unable to answer    Number of Times Moved in the Last Year: 0    Homeless in the Last Year: Patient declined  Utilities: Patient Unable To Answer (07/18/2024)   Epic    Threatened with loss of utilities: Patient unable to answer  Health Literacy: Not on file   Past Medical History:  Past Medical History:  Diagnosis Date   Biventricular failure (HCC)    CVA (cerebral vascular accident) (HCC)    Polysubstance use disorder    Schizophrenia (HCC)     Past Surgical History:  Procedure Laterality Date   APPENDECTOMY     TRACHEOSTOMY TUBE PLACEMENT N/A 03/04/2024   Procedure: CREATION, TRACHEOSTOMY;  Surgeon: [name] Massie RAMAN, MD;  Location: [facility] ORS;  Service: ENT;  Laterality: N/A;    Current Medications: Current Facility-Administered Medications  Medication Dose Route Frequency Provider Last Rate Last Admin   acetaminophen  (TYLENOL ) tablet 650 mg  650 mg Oral Q6H PRN Hampton, Tracie B, NP   650 mg at 09/17/24 2221   alum & mag hydroxide-simeth (MAALOX/MYLANTA) 200-200-20 MG/5ML suspension 30 mL  30 mL Oral Q4H PRN Hampton, Tracie B, NP   30 mL at 08/05/24 0306   amiodarone  (PACERONE ) tablet 400 mg  400 mg Oral Daily [name], [name], MD   400 mg at 09/20/24 0946   ARIPiprazole  ER (ABILIFY  MAINTENA) injection 400 mg  400 mg Intramuscular Q28 days Naythen Heikkila, MD   400 mg at 09/21/24 0919   benzocaine  (ORAJEL) 10 % mucosal gel   Mouth/Throat TID PRN [name], [name], MD       divalproex  (DEPAKOTE  ER) 24 hr tablet 1,000 mg  1,000 mg Oral QHS Salomon Ganser, MD   1,000 mg at 09/20/24 2143   docusate sodium  (COLACE) capsule 100 mg  100 mg Oral Daily Eudell Julian, MD   100 mg at 09/21/24 9092   feeding supplement (ENSURE PLUS HIGH PROTEIN)  liquid 237 mL  237 mL Oral TID BM [name], [name], MD   237 mL at 09/21/24 0908   hydrOXYzine  (ATARAX ) tablet 25 mg  25 mg Oral TID PRN Hampton, Tracie B, NP   25 mg at 09/18/24 2126   magnesium  hydroxide (MILK OF MAGNESIA) suspension 30 mL  30 mL Oral Daily PRN Hampton, Tracie B, NP   30 mL at 08/08/24 2233   memantine  (NAMENDA ) tablet 10 mg  10 mg Oral Daily Terren Haberle, MD   10 mg at 09/21/24 9092   metoprolol  tartrate (LOPRESSOR ) tablet 12.5 mg  12.5 mg Oral BID [name], [name], MD   12.5 mg at 09/20/24 2137   naproxen  (NAPROSYN ) tablet 250 mg  250 mg Oral BID WC Muskan Bolla, MD   250 mg at 09/21/24 9092   OLANZapine  (ZYPREXA ) injection 5 mg  5 mg Intramuscular TID PRN Hampton, Tracie B, NP       OLANZapine  zydis (ZYPREXA ) disintegrating tablet 5 mg  5 mg Oral TID PRN Hampton, Tracie B, NP   5 mg at 08/04/24 1129   oxyCODONE  (Oxy IR/ROXICODONE ) immediate release tablet 5 mg  5 mg Oral Q4H PRN Dorinda Homans T, MD   5 mg at 09/01/24 2102   pantoprazole  (PROTONIX ) EC tablet 40 mg  40 mg Oral Daily [name], [name], MD   40 mg at 09/21/24 9092   sodium chloride  (OCEAN) 0.65 % nasal spray 1 spray  1 spray Each Nare PRN Donnelly Mellow, MD   1 spray at 08/18/24 1459   traZODone  (DESYREL ) tablet 50 mg  50 mg Oral QHS PRN Hampton, Tracie B, NP   50 mg at 09/19/24 2123    Lab Results:  No results found for this or any previous visit (from the past 48 hours).     Blood Alcohol level:  Lab Results  Component Value Date   [facility] <15 07/17/2024   ETH <15 02/20/2024    Metabolic Disorder Labs: Lab Results  Component Value Date   HGBA1C 5.4 08/12/2024   MPG 108.28 08/12/2024   MPG 119.76 07/14/2018   No results found for: PROLACTIN Lab Results  Component Value Date   CHOL 139 08/12/2024   TRIG 73 08/12/2024   HDL 52 08/12/2024   CHOLHDL 2.7 08/12/2024   VLDL 15 08/12/2024   LDLCALC 72 08/12/2024   LDLCALC 119 (H) 07/13/2018    Physical Findings: AIMS:  , ,  ,  ,     CIWA:    COWS:      Psychiatric Specialty Exam:  Presentation  General Appearance: Appropriate for Environment  Eye Contact:Fleeting  Speech:Normal Rate  Speech Volume:Decreased    Mood and Affect  Mood:Anxious  Affect:Flat   Thought Process  Thought Processes:circumstantial Orientation:Partial  Thought Content:discharge focused  Hallucinations:denies  Ideas of Reference:Delusions At baseline Suicidal Thoughts:denies  Homicidal Thoughts:denies   Sensorium  Memory:Immediate Poor; Recent Poor; Remote Poor  Judgment:Impaired  Insight:None   Executive Functions  Concentration:Poor  Attention Span:Poor  Recall:Poor  Fund of Knowledge:Poor  Language:Fair   Psychomotor Activity  Psychomotor Activity:No data recorded  Musculoskeletal: Strength & Muscle Tone: within normal limits Gait & Station: normal Assets  Assets:Communication Skills; Desire for Improvement; Social Support    Physical Exam: Physical Exam Vitals and nursing note reviewed.    ROS Blood pressure (!) 96/55, pulse 63, temperature 97.8 F (36.6 C), resp. rate 16, height 6' 1 (1.854 m), weight 81.2 kg, SpO2 95%. Body mass index is 23.62 kg/m.  Diagnosis: Active Problems:   Schizophrenia, paranoid (HCC)   Dementia with behavioral disturbance (HCC)   PLAN: Safety and Monitoring:  -- Involuntary admission to inpatient psychiatric unit for safety, stabilization and treatment  -- Daily contact with patient to assess and evaluate symptoms and progress in treatment  -- Patient's case to be discussed in multi-disciplinary team meeting  -- Observation Level : q15 minute checks  -- Vital signs:  q12 hours  -- Precautions: suicide, elopement, and assault -- Encouraged patient to participate in unit milieu and in scheduled group therapies  2. Psychiatric Treatment:  Scheduled Medications:  Discontinued oral Abilify  LAY Abilify  Maintena 400 mg q4 weeks given on 07/27/24,  08/24/2024  Cogentin  1mg  one dose given for EPS Depakote  ER increased to 1000 mg to help with mood stabilization  Depakote  levels 09/02/24: 57  -- The risks/benefits/side-effects/alternatives to this  medication were discussed in detail with the patient and time was given for questions. The patient consents to medication trial.  3. Medical Issues Being Addressed:  [facility] consulted for wound care management   Assessment and Plan:   Sacral Decubitus Ulcer Full thickness decubitus ulcer in setting of recent extended hospitalization, discharged from [facility] 14th Wound looks pretty good overall WOC on board we appreciate input   Cognitive Deficit Concern for anoxic brain injury at discharge 02/2024 Delirium precautions   Peripheral Vascular Disease Legs with features c/w peripheral vascular disease, diminished DP pulses Would benefit from outpatient ABI's   HFrEF Euvolemic at this time Continue home medications   Hx Atrial Flutter Not on any anticoagulation at this time EKG with NSR Continue amiodarone   Hypertension Noted, adjust meds gradually after med rec complete   Tooth pain/Infection - Added: Augmentin  along with Orajel BP -better today no longer hypotensive-Will continue reduced dose of metoprolol  12.5 bid.   4. Discharge Planning:   -- Social work and case management to assist with discharge planning and identification of hospital follow-up needs prior to discharge  -- Estimated LOS: 3-4 days  Allyn Foil, MD 09/21/2024, 1:11 PM

## 2024-09-21 NOTE — Plan of Care (Signed)
  Problem: Activity: Goal: Interest or engagement in activities will improve Outcome: Progressing   Problem: Coping: Goal: Ability to demonstrate self-control will improve Outcome: Progressing   Problem: Education: Goal: Mental status will improve Outcome: Not Progressing

## 2024-09-21 NOTE — Progress Notes (Signed)
 Behavior:  Confused. Pleasant and cooperative.  Present in the milieu.     Psych assessment: Denies SI/HI and AVH.    Group attendance:  0/1  Medication/ PRNs: Compliant. Cardiac medications held.  Dr. JINNY aware.  Pain: Denies.  15 min checks in place for safety.

## 2024-09-22 DIAGNOSIS — F200 Paranoid schizophrenia: Secondary | ICD-10-CM | POA: Diagnosis not present

## 2024-09-22 DIAGNOSIS — F03918 Unspecified dementia, unspecified severity, with other behavioral disturbance: Secondary | ICD-10-CM | POA: Diagnosis not present

## 2024-09-22 NOTE — Progress Notes (Signed)
 [facility] MD Progress Note  09/22/2024 12:36 PM Miguel [name]  MRN:  [id number]  Miguel [name] is a 74 year old male with a past psychiatric history significant for schizophrenia who presents under involuntary commitment initiated by family due to medication non-adherence, increasing paranoia, and behavioral disorganization. 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 Brother reports multiple episodes of patient whispering through the night, stating people were talking about him and planning to harm him. Patient was advised by his ACT Team to come to the ED; he refused, prompting the family to pursue IVC.Patient is admitted to [facility] unit with Q15 min safety monitoring. Multidisciplinary team approach is offered. Medication management; group/milieu therapy is offered.   Subjective:  Chart reviewed, case discussed in multidisciplinary meeting, patient seen during rounds.   Patient is noted to be sitting in the day area and dozing off.  He remains confused and disorganized in thought process.  He talks about people stealing his money and not giving his money.  Patient denies SI/HI/plan.  Per nursing patient remains calm and cooperative and no behavioral problems on the unit. Past Psychiatric History: see h&P Family History:  Family History  Family history unknown: Yes   Social History:  Social History   Substance and Sexual Activity  Alcohol Use No     Social History   Substance and Sexual Activity  Drug Use No    Social History   Socioeconomic History   Marital status:  Divorced    Spouse name: Not on file   Number of children: Not on file   Years of education: Not on file   Highest education level: Not on file  Occupational History   Not on file  Tobacco Use   Smoking status: Every Day    Current packs/day: 0.50    Average packs/day: 0.5 packs/day for 46.1 years (23.0 ttl pk-yrs)    Types: Cigarettes    Start date: 38   Smokeless tobacco: Never  Vaping Use   Vaping status: Never Used  Substance and Sexual Activity   Alcohol use: No   Drug use: No   Sexual activity: Not on file  Other Topics Concern   Not on file  Social History Narrative   Not on file   Social Drivers of Health   Tobacco Use: High Risk (07/18/2024)   Patient History    Smoking Tobacco Use: Every Day    Smokeless Tobacco Use: Never    Passive Exposure: Not on file  Financial Resource Strain: Not on file  Food Insecurity: Patient Unable To Answer (07/18/2024)   Epic    Worried About Programme Researcher, Broadcasting/film/video in the Last Year: Patient unable to answer    Ran Out of Food in the Last Year: Patient unable to answer  Transportation Needs: Unmet Transportation Needs (07/18/2024)   Epic    Lack of Transportation (Medical): Yes    Lack of Transportation (Non-Medical): Yes  Physical Activity: Not on file  Stress: Not on file  Social Connections: Patient Unable To Answer (07/18/2024)   Social Connection and Isolation Panel    Frequency of Communication with Friends and Family: Patient unable to answer    Frequency of Social Gatherings with  Friends and Family: Patient unable to answer    Attends Religious Services: Patient unable to answer    Active Member of Clubs or Organizations: Patient unable to answer    Attends Club or Organization Meetings: Patient unable to answer    Marital Status: Patient unable to answer  Depression (PHQ2-9): Not on file  Alcohol Screen: Low Risk (07/18/2024)   Alcohol Screen    Last Alcohol Screening Score (AUDIT): 0  Housing: Unknown (07/18/2024)    Epic    Unable to Pay for Housing in the Last Year: Patient unable to answer    Number of Times Moved in the Last Year: 0    Homeless in the Last Year: Patient declined  Utilities: Patient Unable To Answer (07/18/2024)   Epic    Threatened with loss of utilities: Patient unable to answer  Health Literacy: Not on file   Past Medical History:  Past Medical History:  Diagnosis Date   Biventricular failure (HCC)    CVA (cerebral vascular accident) (HCC)    Polysubstance use disorder    Schizophrenia (HCC)     Past Surgical History:  Procedure Laterality Date   APPENDECTOMY     TRACHEOSTOMY TUBE PLACEMENT N/A 03/04/2024   Procedure: CREATION, TRACHEOSTOMY;  Surgeon: [name] Massie RAMAN, MD;  Location: [facility] ORS;  Service: ENT;  Laterality: N/A;    Current Medications: Current Facility-Administered Medications  Medication Dose Route Frequency Provider Last Rate Last Admin   acetaminophen  (TYLENOL ) tablet 650 mg  650 mg Oral Q6H PRN Hampton, Tracie B, NP   650 mg at 09/17/24 2221   alum & mag hydroxide-simeth (MAALOX/MYLANTA) 200-200-20 MG/5ML suspension 30 mL  30 mL Oral Q4H PRN Hampton, Tracie B, NP   30 mL at 08/05/24 0306   amiodarone  (PACERONE ) tablet 400 mg  400 mg Oral Daily [name], [name], MD   400 mg at 09/20/24 0946   ARIPiprazole  ER (ABILIFY  MAINTENA) injection 400 mg  400 mg Intramuscular Q28 days Deniss Wormley, MD   400 mg at 09/21/24 0919   benzocaine  (ORAJEL) 10 % mucosal gel   Mouth/Throat TID PRN [name], [name], MD       divalproex  (DEPAKOTE  ER) 24 hr tablet 1,000 mg  1,000 mg Oral QHS Caramia Boutin, MD   1,000 mg at 09/21/24 2118   docusate sodium  (COLACE) capsule 100 mg  100 mg Oral Daily Osbaldo Mark, MD   100 mg at 09/22/24 1058   feeding supplement (ENSURE PLUS HIGH PROTEIN) liquid 237 mL  237 mL Oral TID BM [name], [name], MD   237 mL at 09/22/24 1100   hydrOXYzine  (ATARAX ) tablet 25 mg  25 mg Oral TID PRN Hampton, Tracie B, NP   25 mg at 09/21/24 2118    magnesium  hydroxide (MILK OF MAGNESIA) suspension 30 mL  30 mL Oral Daily PRN Hampton, Tracie B, NP   30 mL at 08/08/24 2233   memantine  (NAMENDA ) tablet 10 mg  10 mg Oral Daily Analyn Matusek, MD   10 mg at 09/22/24 1058   metoprolol  tartrate (LOPRESSOR ) tablet 12.5 mg  12.5 mg Oral BID [name], [name], MD   12.5 mg at 09/20/24 2137   naproxen  (NAPROSYN ) tablet 250 mg  250 mg Oral BID WC Shadaya Marschner, MD   250 mg at 09/22/24 1057   OLANZapine  (ZYPREXA ) injection 5 mg  5 mg Intramuscular TID PRN Hampton, Tracie B, NP       OLANZapine  zydis (ZYPREXA ) disintegrating tablet 5 mg  5 mg Oral  TID PRN Hampton, Tracie B, NP   5 mg at 08/04/24 1129   oxyCODONE  (Oxy IR/ROXICODONE ) immediate release tablet 5 mg  5 mg Oral Q4H PRN Dorinda Homans T, MD   5 mg at 09/01/24 2102   pantoprazole  (PROTONIX ) EC tablet 40 mg  40 mg Oral Daily [name], [name], MD   40 mg at 09/22/24 1058   sodium chloride  (OCEAN) 0.65 % nasal spray 1 spray  1 spray Each Nare PRN Donnelly Mellow, MD   1 spray at 08/18/24 1459   traZODone  (DESYREL ) tablet 50 mg  50 mg Oral QHS PRN Hampton, Tracie B, NP   50 mg at 09/21/24 2118    Lab Results:  No results found for this or any previous visit (from the past 48 hours).     Blood Alcohol level:  Lab Results  Component Value Date   [facility] <15 07/17/2024   ETH <15 02/20/2024    Metabolic Disorder Labs: Lab Results  Component Value Date   HGBA1C 5.4 08/12/2024   MPG 108.28 08/12/2024   MPG 119.76 07/14/2018   No results found for: PROLACTIN Lab Results  Component Value Date   CHOL 139 08/12/2024   TRIG 73 08/12/2024   HDL 52 08/12/2024   CHOLHDL 2.7 08/12/2024   VLDL 15 08/12/2024   LDLCALC 72 08/12/2024   LDLCALC 119 (H) 07/13/2018    Physical Findings: AIMS:  , ,  ,  ,    CIWA:    COWS:      Psychiatric Specialty Exam:  Presentation  General Appearance: Appropriate for Environment  Eye Contact:Fleeting  Speech:Normal Rate  Speech  Volume:Decreased    Mood and Affect  Mood:Anxious  Affect:Flat   Thought Process  Thought Processes:circumstantial Orientation:Partial  Thought Content:discharge focused  Hallucinations:denies  Ideas of Reference:Delusions At baseline Suicidal Thoughts:denies  Homicidal Thoughts:denies   Sensorium  Memory:Immediate Poor; Recent Poor; Remote Poor  Judgment:Impaired  Insight:None   Executive Functions  Concentration:Poor  Attention Span:Poor  Recall:Poor  Fund of Knowledge:Poor  Language:Fair   Psychomotor Activity  Psychomotor Activity:No data recorded  Musculoskeletal: Strength & Muscle Tone: within normal limits Gait & Station: normal Assets  Assets:Communication Skills; Desire for Improvement; Social Support    Physical Exam: Physical Exam Vitals and nursing note reviewed.    ROS Blood pressure (!) 93/57, pulse 75, temperature (!) 97.3 F (36.3 C), resp. rate 16, height 6' 1 (1.854 m), weight 81.2 kg, SpO2 91%. Body mass index is 23.62 kg/m.  Diagnosis: Active Problems:   Schizophrenia, paranoid (HCC)   Dementia with behavioral disturbance (HCC)   PLAN: Safety and Monitoring:  -- Involuntary admission to inpatient psychiatric unit for safety, stabilization and treatment  -- Daily contact with patient to assess and evaluate symptoms and progress in treatment  -- Patient's case to be discussed in multi-disciplinary team meeting  -- Observation Level : q15 minute checks  -- Vital signs:  q12 hours  -- Precautions: suicide, elopement, and assault -- Encouraged patient to participate in unit milieu and in scheduled group therapies  2. Psychiatric Treatment:  Scheduled Medications:  Discontinued oral Abilify  LAY Abilify  Maintena 400 mg q4 weeks given on 07/27/24, 08/24/2024  Cogentin  1mg  one dose given for EPS Depakote  ER increased to 1000 mg to help with mood stabilization  Depakote  levels 09/02/24: 57  -- The  risks/benefits/side-effects/alternatives to this medication were discussed in detail with the patient and time was given for questions. The patient consents to medication trial.  3. Medical Issues Being  Addressed:  [facility] consulted for wound care management   Assessment and Plan:   Sacral Decubitus Ulcer Full thickness decubitus ulcer in setting of recent extended hospitalization, discharged from [facility] Nov 14th Wound looks pretty good overall WOC on board we appreciate input   Cognitive Deficit Concern for anoxic brain injury at discharge 02/2024 Delirium precautions   Peripheral Vascular Disease Legs with features c/w peripheral vascular disease, diminished DP pulses Would benefit from outpatient ABI's   HFrEF Euvolemic at this time Continue home medications   Hx Atrial Flutter Not on any anticoagulation at this time EKG with NSR Continue amiodarone   Hypertension Noted, adjust meds gradually after med rec complete   Tooth pain/Infection - Added: Augmentin  along with Orajel BP -better today no longer hypotensive-Will continue reduced dose of metoprolol  12.5 bid.   4. Discharge Planning:   -- Social work and case management to assist with discharge planning and identification of hospital follow-up needs prior to discharge  -- Estimated LOS: 3-4 days  Allyn Foil, MD 09/22/2024, 12:36 PM

## 2024-09-22 NOTE — Progress Notes (Signed)
 Pt disoriented to time and situation. Pt did not sleep well through the night getting up to check the time. He denies pain this shift. Sacral dressing changed. Q 15 min safety checks in place.   09/21/24 2118  Psych Admission Type (Psych Patients Only)  Admission Status Involuntary  Psychosocial Assessment  Patient Complaints Disorientation  Eye Contact Fair  Facial Expression Animated  Affect Preoccupied  Speech Tangential  Interaction Assertive  Motor Activity Slow  Appearance/Hygiene Layered clothes  Behavior Characteristics Cooperative  Mood Preoccupied  Thought Process  Coherency Tangential  Content Preoccupation  Delusions None reported or observed  Perception WDL  Hallucination None reported or observed  Judgment Poor  Confusion Mild  Danger to Self  Current suicidal ideation? Denies  Danger to Others  Danger to Others None reported or observed

## 2024-09-22 NOTE — Plan of Care (Signed)
  Problem: Activity: Goal: Interest or engagement in activities will improve Outcome: Progressing   Problem: Education: Goal: Mental status will improve Outcome: Not Progressing Goal: Verbalization of understanding the information provided will improve Outcome: Not Progressing   

## 2024-09-22 NOTE — Group Note (Signed)
 Recreation Therapy Group Note   Group Topic:Leisure Education  Group Date: 09/22/2024 Start Time: 1400 End Time: 1440 Facilitators: Celestia Jeoffrey BRAVO, LRT, CTRS Location: Dayroom  Group Description: Bingo. Patients played multiple rounds of bingo. LRT and pts discussed the definition of leisure, things they do in their free time outside of the hospital, and how bingo is also a leisure activity. Pts received a coloring book, word search book, or journal as a prize.     Goal Area(s) Addressed:    Patient will identify a current leisure interest.    Patient will learn the definition of leisure.   Patient will have the opportunity to try a new leisure activity.   Patient will communicate with peers and LRT.    Affect/Mood: Appropriate   Participation Level: Active and Engaged   Participation Quality: Independent   Behavior: Calm and Cooperative   Speech/Thought Process: Loose association   Insight: Fair   Judgement: Fair    Modes of Intervention: Competitive Play   Patient Response to Interventions:  Receptive   Education Outcome:  Acknowledges education   Clinical Observations/Individualized Feedback: Miguel [name] was active in their participation of session activities and group discussion. Pt received won a facilities manager and received a stress ball as a prize. Pt interacted well with LRT and peers duration of session.    Plan: Continue to engage patient in RT group sessions 2-3x/week.   Jeoffrey BRAVO Celestia, LRT, CTRS 09/22/2024 2:56 PM

## 2024-09-22 NOTE — BHH Counselor (Signed)
 CSW contacted Ms.Miguel [name] at [facility] to follow-up on assessment completed by the nurse on 09/17/24.   Reports that the original bed that pt was assessed for was a family care home, and due to pt's cognitive status he would be more appropriate for an assisted living facility bed.   Miguel [name] reports that she is in the process of getting a bed open at assisted living facility and that once it is open pt can transition, reports she is awaiting approval from pt's guardian.   CSW awaits further instruction from placement and pt's guardian at this time.   Lum Croft, MSW, [place] 09/22/2024 12:46 PM

## 2024-09-22 NOTE — Group Note (Signed)
 Date:  09/22/2024 Time:  11:05 AM  Group Topic/Focus:  Healthy Communication:   The focus of this group is to discuss communication, barriers to communication, as well as healthy ways to communicate with others.    Participation Level:  Active  Participation Quality:  Appropriate  Affect:  Appropriate  Cognitive:  Appropriate  Insight: Appropriate  Engagement in Group:  Engaged  Modes of Intervention:  Discussion   Miguel [name] 09/22/2024, 11:05 AM

## 2024-09-22 NOTE — Group Note (Signed)
 LCSW Group Therapy Note    Group Date: 09/22/2024 Start Time: 1300 End Time: 1400   Type of Therapy and Topic: Group Therapy: Body Image  Participation Level:  Minimal  Description of Group:  Patients were educated about body image and asked to think about whether they have a healthy or unhealthy body image. Patients were led in a discussion about factors that contribute to body image, both internal and external. Patients were asked to discuss strengths of the human body outside of appearance, such as being able to fight off diseases and provide stress relief. Lastly, patients were asked to identify one way in which they appreciate their own body outside of appearance.   Therapeutic Goals:   1. Patient will differentiate between a healthy and unhealthy body image. 2. Patient will identify what contributes to body image 3. Patient will discuss the strengths of the human body. 4. Patient will identify a positive attribute of their body outside of physical appearance.  Summary of Patient Progress:   Patients were educated about body image and asked to think about whether they have a healthy or unhealthy body image. Patients were led in a discussion about factors that contribute to body image, both internal and external. Patients were asked to discuss strengths of the human body outside of appearance, such as being able to fight off diseases and provide stress relief. Lastly, patients were asked to identify one way in which they appreciate their own body outside of appearance.     Therapeutic Modalities: Cognitive Behavioral Therapy; Solution-Focused Therapy  Miguel [name], LCSWA 09/22/2024  2:01 PM

## 2024-09-22 NOTE — Progress Notes (Signed)
 Behavior:  Confused, but cooperative.   Psych assessment: Paranoid about staff taking money from his account. Denies SI/HI.    Group attendance:  3/3  Medication/ PRNs: Compliant.  Cardiac medications held.  Dr. JINNY made aware.   Pain: Denies.  15 min checks in place for safety.    Wound cleaned and dressing changed.

## 2024-09-22 NOTE — Plan of Care (Signed)
" °  Problem: Activity: Goal: Interest or engagement in activities will improve Outcome: Progressing   Problem: Coping: Goal: Ability to verbalize frustrations and anger appropriately will improve Outcome: Progressing   Problem: Education: Goal: Knowledge of [facility] Education information/materials will improve Outcome: Progressing Goal: Emotional status will improve Outcome: Progressing Goal: Mental status will improve Outcome: Progressing   "

## 2024-09-22 NOTE — Group Note (Signed)
 Date:  09/22/2024 Time:  9:22 PM  Group Topic/Focus:  Wrap-Up Group:   The focus of this group is to help patients review their daily goal of treatment and discuss progress on daily workbooks.    Participation Level:  Did Not Attend  Participation Quality:     Affect:     Cognitive:     Insight: None  Engagement in Group:  None  Modes of Intervention:     Additional Comments:    [name] 09/22/2024, 9:22 PM

## 2024-09-23 DIAGNOSIS — F03918 Unspecified dementia, unspecified severity, with other behavioral disturbance: Secondary | ICD-10-CM | POA: Diagnosis not present

## 2024-09-23 DIAGNOSIS — F200 Paranoid schizophrenia: Secondary | ICD-10-CM | POA: Diagnosis not present

## 2024-09-23 NOTE — Plan of Care (Signed)
   Problem: Education: Goal: Emotional status will improve Outcome: Progressing

## 2024-09-23 NOTE — Group Note (Signed)
 Date:  09/23/2024 Time:  11:20 AM  Group Topic/Focus:  Recovery Goals:   The focus of this group is to identify appropriate goals for recovery and establish a plan to achieve them.    Participation Level:  Active  Participation Quality:  Appropriate  Affect:  Appropriate  Cognitive:  Appropriate  Insight: Appropriate  Engagement in Group:  Engaged  Modes of Intervention:  Discussion   Arland Nutting 09/23/2024, 11:20 AM

## 2024-09-23 NOTE — Progress Notes (Signed)
" °   09/23/24 1400  Psych Admission Type (Psych Patients Only)  Admission Status Involuntary  Psychosocial Assessment  Patient Complaints Confusion  Eye Contact Fair  Facial Expression Animated  Affect Preoccupied  Speech Tangential  Interaction Assertive  Motor Activity Slow  Appearance/Hygiene Layered clothes  Behavior Characteristics Cooperative  Mood Preoccupied  Thought Process  Coherency Tangential  Content Preoccupation  Delusions Paranoid  Perception WDL  Hallucination None reported or observed  Judgment Poor  Confusion Mild  Danger to Self  Current suicidal ideation? Denies  Danger to Others  Danger to Others None reported or observed    "

## 2024-09-23 NOTE — Progress Notes (Signed)
 [facility] MD Progress Note  09/23/2024 11:30 AM Miguel [name]  MRN:  [id number]  Miguel [name]. Miguel [name] is a 74 year old male with a past psychiatric history significant for schizophrenia who presents under involuntary commitment initiated by family due to medication non-adherence, increasing paranoia, and behavioral disorganization. 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 Brother reports multiple episodes of patient whispering through the night, stating people were talking about him and planning to harm him. Patient was advised by his ACT Team to come to the ED; he refused, prompting the family to pursue IVC.Patient is admitted to [facility] unit with Q15 min safety monitoring. Multidisciplinary team approach is offered. Medication management; group/milieu therapy is offered.   Subjective:  Chart reviewed, case discussed in multidisciplinary meeting, patient seen during rounds.   Patient is noted to be confused but is calm and cooperative on the unit.  He participates in the groups.  He does talk to the staff members and display paranoia about not having access to his money or somebody messing up with his things in his room.  He is unable to comprehend his discharge planning, having a legal guardian at this time.  He denies any physical problems.  He is taking his medications with no reported side effects.  His blood pressure has been fluctuating and nursing is holding off the blood pressure medications accordingly. Past Psychiatric History: see h&P Family History:  Family History  Family  history unknown: Yes   Social History:  Social History   Substance and Sexual Activity  Alcohol Use No     Social History   Substance and Sexual Activity  Drug Use No    Social History   Socioeconomic History   Marital status: Divorced    Spouse name: Not on file   Number of children: Not on file   Years of education: Not on file   Highest education level: Not on file  Occupational History   Not on file  Tobacco Use   Smoking status: Every Day    Current packs/day: 0.50    Average packs/day: 0.5 packs/day for 46.1 years (23.0 ttl pk-yrs)    Types: Cigarettes    Start date: 66   Smokeless tobacco: Never  Vaping Use   Vaping status: Never Used  Substance and Sexual Activity   Alcohol use: No   Drug use: No   Sexual activity: Not on file  Other Topics Concern   Not on file  Social History Narrative   Not on file   Social Drivers of Health   Tobacco Use: High Risk (07/18/2024)   Patient History    Smoking Tobacco Use: Every Day    Smokeless Tobacco Use: Never    Passive Exposure: Not on file  Financial Resource Strain: Not on file  Food Insecurity: Patient Unable To Answer (07/18/2024)   Epic    Worried About Programme Researcher, Broadcasting/film/video in the Last Year: Patient unable to answer    Ran Out of Food in the Last Year: Patient unable to answer  Transportation Needs: Unmet Transportation Needs (07/18/2024)   Epic    Lack of Transportation (Medical): Yes    Lack of Transportation (Non-Medical): Yes  Physical Activity: Not  on file  Stress: Not on file  Social Connections: Patient Unable To Answer (07/18/2024)   Social Connection and Isolation Panel    Frequency of Communication with Friends and Family: Patient unable to answer    Frequency of Social Gatherings with Friends and Family: Patient unable to answer    Attends Religious Services: Patient unable to answer    Active Member of Clubs or Organizations: Patient unable to answer    Attends Banker  Meetings: Patient unable to answer    Marital Status: Patient unable to answer  Depression (PHQ2-9): Not on file  Alcohol Screen: Low Risk (07/18/2024)   Alcohol Screen    Last Alcohol Screening Score (AUDIT): 0  Housing: Unknown (07/18/2024)   Epic    Unable to Pay for Housing in the Last Year: Patient unable to answer    Number of Times Moved in the Last Year: 0    Homeless in the Last Year: Patient declined  Utilities: Patient Unable To Answer (07/18/2024)   Epic    Threatened with loss of utilities: Patient unable to answer  Health Literacy: Not on file   Past Medical History:  Past Medical History:  Diagnosis Date   Biventricular failure (HCC)    CVA (cerebral vascular accident) (HCC)    Polysubstance use disorder    Schizophrenia (HCC)     Past Surgical History:  Procedure Laterality Date   APPENDECTOMY     TRACHEOSTOMY TUBE PLACEMENT N/A 03/04/2024   Procedure: CREATION, TRACHEOSTOMY;  Surgeon: [name] Massie RAMAN, MD;  Location: [facility] ORS;  Service: ENT;  Laterality: N/A;    Current Medications: Current Facility-Administered Medications  Medication Dose Route Frequency Provider Last Rate Last Admin   acetaminophen  (TYLENOL ) tablet 650 mg  650 mg Oral Q6H PRN Hampton, Tracie B, NP   650 mg at 09/17/24 2221   alum & mag hydroxide-simeth (MAALOX/MYLANTA) 200-200-20 MG/5ML suspension 30 mL  30 mL Oral Q4H PRN Hampton, Tracie B, NP   30 mL at 08/05/24 0306   amiodarone  (PACERONE ) tablet 400 mg  400 mg Oral Daily [name], [name], MD   400 mg at 09/23/24 1034   ARIPiprazole  ER (ABILIFY  MAINTENA) injection 400 mg  400 mg Intramuscular Q28 days Joneric Streight, MD   400 mg at 09/21/24 9080   benzocaine  (ORAJEL) 10 % mucosal gel   Mouth/Throat TID PRN [name], [name], MD       divalproex  (DEPAKOTE  ER) 24 hr tablet 1,000 mg  1,000 mg Oral QHS Sherald Balbuena, MD   1,000 mg at 09/22/24 2126   docusate sodium  (COLACE) capsule 100 mg  100 mg Oral Daily Graciela Plato, MD   100 mg at  09/23/24 1035   feeding supplement (ENSURE PLUS HIGH PROTEIN) liquid 237 mL  237 mL Oral TID BM [name], [name], MD   237 mL at 09/23/24 1040   hydrOXYzine  (ATARAX ) tablet 25 mg  25 mg Oral TID PRN Hampton, Tracie B, NP   25 mg at 09/21/24 2118   magnesium  hydroxide (MILK OF MAGNESIA) suspension 30 mL  30 mL Oral Daily PRN Hampton, Tracie B, NP   30 mL at 08/08/24 2233   memantine  (NAMENDA ) tablet 10 mg  10 mg Oral Daily Nyeem Stoke, MD   10 mg at 09/23/24 1038   metoprolol  tartrate (LOPRESSOR ) tablet 12.5 mg  12.5 mg Oral BID [name], [name], MD   12.5 mg at 09/23/24 1035   naproxen  (NAPROSYN ) tablet 250 mg  250 mg Oral BID WC  Brazos Sandoval, MD   250 mg at 09/23/24 1038   OLANZapine  (ZYPREXA ) injection 5 mg  5 mg Intramuscular TID PRN Hampton, Tracie B, NP       OLANZapine  zydis (ZYPREXA ) disintegrating tablet 5 mg  5 mg Oral TID PRN Hampton, Tracie B, NP   5 mg at 08/04/24 1129   oxyCODONE  (Oxy IR/ROXICODONE ) immediate release tablet 5 mg  5 mg Oral Q4H PRN Dorinda Homans T, MD   5 mg at 09/01/24 2102   pantoprazole  (PROTONIX ) EC tablet 40 mg  40 mg Oral Daily [name], [name], MD   40 mg at 09/23/24 1034   sodium chloride  (OCEAN) 0.65 % nasal spray 1 spray  1 spray Each Nare PRN Donnelly Mellow, MD   1 spray at 08/18/24 1459   traZODone  (DESYREL ) tablet 50 mg  50 mg Oral QHS PRN Hampton, Tracie B, NP   50 mg at 09/21/24 2118    Lab Results:  No results found for this or any previous visit (from the past 48 hours).     Blood Alcohol level:  Lab Results  Component Value Date   [facility] <15 07/17/2024   ETH <15 02/20/2024    Metabolic Disorder Labs: Lab Results  Component Value Date   HGBA1C 5.4 08/12/2024   MPG 108.28 08/12/2024   MPG 119.76 07/14/2018   No results found for: PROLACTIN Lab Results  Component Value Date   CHOL 139 08/12/2024   TRIG 73 08/12/2024   HDL 52 08/12/2024   CHOLHDL 2.7 08/12/2024   VLDL 15 08/12/2024   LDLCALC 72 08/12/2024   LDLCALC 119  (H) 07/13/2018    Physical Findings: AIMS:  , ,  ,  ,    CIWA:    COWS:      Psychiatric Specialty Exam:  Presentation  General Appearance: Appropriate for Environment  Eye Contact:Fleeting  Speech:Normal Rate  Speech Volume:Decreased    Mood and Affect  Mood:Anxious  Affect:Flat   Thought Process  Thought Processes:circumstantial Orientation:Partial  Thought Content:discharge focused  Hallucinations:denies  Ideas of Reference:Delusions At baseline Suicidal Thoughts:denies  Homicidal Thoughts:denies   Sensorium  Memory:Immediate Poor; Recent Poor; Remote Poor  Judgment:Impaired  Insight:None   Executive Functions  Concentration:Poor  Attention Span:Poor  Recall:Poor  Fund of Knowledge:Poor  Language:Fair   Psychomotor Activity  Psychomotor Activity:No data recorded  Musculoskeletal: Strength & Muscle Tone: within normal limits Gait & Station: normal Assets  Assets:Communication Skills; Desire for Improvement; Social Support    Physical Exam: Physical Exam Vitals and nursing note reviewed.    ROS Blood pressure (!) 161/71, pulse 62, temperature 98 F (36.7 C), resp. rate 16, height 6' 1 (1.854 m), weight 81.2 kg, SpO2 98%. Body mass index is 23.62 kg/m.  Diagnosis: Active Problems:   Schizophrenia, paranoid (HCC)   Dementia with behavioral disturbance (HCC)   PLAN: Safety and Monitoring:  -- Involuntary admission to inpatient psychiatric unit for safety, stabilization and treatment  -- Daily contact with patient to assess and evaluate symptoms and progress in treatment  -- Patient's case to be discussed in multi-disciplinary team meeting  -- Observation Level : q15 minute checks  -- Vital signs:  q12 hours  -- Precautions: suicide, elopement, and assault -- Encouraged patient to participate in unit milieu and in scheduled group therapies  2. Psychiatric Treatment:  Scheduled Medications:  Discontinued oral  Abilify  LAY Abilify  Maintena 400 mg q4 weeks given on 07/27/24, 08/24/2024  Cogentin  1mg  one dose given for EPS Depakote  ER increased to  1000 mg to help with mood stabilization  Depakote  levels 09/02/24: 57  -- The risks/benefits/side-effects/alternatives to this medication were discussed in detail with the patient and time was given for questions. The patient consents to medication trial.  3. Medical Issues Being Addressed:  [facility] consulted for wound care management   Assessment and Plan:   Sacral Decubitus Ulcer Full thickness decubitus ulcer in setting of recent extended hospitalization, discharged from [facility] Nov 14th Wound looks pretty good overall WOC on board we appreciate input   Cognitive Deficit Concern for anoxic brain injury at discharge 02/2024 Delirium precautions   Peripheral Vascular Disease Legs with features c/w peripheral vascular disease, diminished DP pulses Would benefit from outpatient ABI's   HFrEF Euvolemic at this time Continue home medications   Hx Atrial Flutter Not on any anticoagulation at this time EKG with NSR Continue amiodarone   Hypertension Noted, adjust meds gradually after med rec complete   Tooth pain/Infection - Added: Augmentin  along with Orajel BP -better today no longer hypotensive-Will continue reduced dose of metoprolol  12.5 bid.   4. Discharge Planning:   -- Social work and case management to assist with discharge planning and identification of hospital follow-up needs prior to discharge  -- Estimated LOS: 3-4 days  Aizen Duval, MD 09/23/2024, 11:30 AM

## 2024-09-24 DIAGNOSIS — F200 Paranoid schizophrenia: Secondary | ICD-10-CM | POA: Diagnosis not present

## 2024-09-24 DIAGNOSIS — F03918 Unspecified dementia, unspecified severity, with other behavioral disturbance: Secondary | ICD-10-CM | POA: Diagnosis not present

## 2024-09-24 NOTE — Progress Notes (Signed)
 [facility] MD Progress Note  09/24/2024 3:43 PM SHAHIEM BEDWELL  MRN:  [id number]  Easton Fetty. Miguel [name] is a 74 year old male with a past psychiatric history significant for schizophrenia who presents under involuntary commitment initiated by family due to medication non-adherence, increasing paranoia, and behavioral disorganization. 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 Brother reports multiple episodes of patient whispering through the night, stating people were talking about him and planning to harm him. Patient was advised by his ACT Team to come to the ED; he refused, prompting the family to pursue IVC.Patient is admitted to [facility] unit with Q15 min safety monitoring. Multidisciplinary team approach is offered. Medication management; group/milieu therapy is offered.   Subjective:  Chart reviewed, case discussed in multidisciplinary meeting, patient seen during rounds.   Patient is noted to be resting in bed.  He is noted to be drowsy.  His room remains malodorous.  Per nursing patient remains confused and disorganized and proceeds to use diapers and his clothing basket in his room instead of disposing them.  Patient remains disorganized talking about random things that includes his money his clothes etc.  He offers no other physical concerns.  Patient denies SI/HI/plan denies hallucinations.  Past Psychiatric History: see h&P Family History:  Family History  Family history unknown: Yes   Social History:  Social History   Substance and Sexual Activity  Alcohol Use No      Social History   Substance and Sexual Activity  Drug Use No    Social History   Socioeconomic History   Marital status: Divorced    Spouse name: Not on file   Number of children: Not on file   Years of education: Not on file   Highest education level: Not on file  Occupational History   Not on file  Tobacco Use   Smoking status: Every Day    Current packs/day: 0.50    Average packs/day: 0.5 packs/day for 46.1 years (23.0 ttl pk-yrs)    Types: Cigarettes    Start date: 77   Smokeless tobacco: Never  Vaping Use   Vaping status: Never Used  Substance and Sexual Activity   Alcohol use: No   Drug use: No   Sexual activity: Not on file  Other Topics Concern   Not on file  Social History Narrative   Not on file   Social Drivers of Health   Tobacco Use: High Risk (07/18/2024)   Patient History    Smoking Tobacco Use: Every Day    Smokeless Tobacco Use: Never    Passive Exposure: Not on file  Financial Resource Strain: Not on file  Food Insecurity: Patient Unable To Answer (07/18/2024)   Epic    Worried About Programme Researcher, Broadcasting/film/video in the Last Year: Patient unable to answer    Ran Out of Food in the Last Year: Patient unable to answer  Transportation Needs: Unmet Transportation Needs (07/18/2024)   Epic    Lack of Transportation (Medical): Yes    Lack of Transportation (Non-Medical): Yes  Physical Activity: Not on file  Stress: Not on file  Social Connections: Patient Unable To Answer (07/18/2024)   Social Connection and Isolation Panel  Frequency of Communication with Friends and Family: Patient unable to answer    Frequency of Social Gatherings with Friends and Family: Patient unable to answer    Attends Religious Services: Patient unable to answer    Active Member of Clubs or Organizations: Patient unable to answer    Attends Banker Meetings: Patient unable to answer    Marital Status: Patient unable to answer  Depression (PHQ2-9): Not on file   Alcohol Screen: Low Risk (07/18/2024)   Alcohol Screen    Last Alcohol Screening Score (AUDIT): 0  Housing: Unknown (07/18/2024)   Epic    Unable to Pay for Housing in the Last Year: Patient unable to answer    Number of Times Moved in the Last Year: 0    Homeless in the Last Year: Patient declined  Utilities: Patient Unable To Answer (07/18/2024)   Epic    Threatened with loss of utilities: Patient unable to answer  Health Literacy: Not on file   Past Medical History:  Past Medical History:  Diagnosis Date   Biventricular failure (HCC)    CVA (cerebral vascular accident) (HCC)    Polysubstance use disorder    Schizophrenia (HCC)     Past Surgical History:  Procedure Laterality Date   APPENDECTOMY     TRACHEOSTOMY TUBE PLACEMENT N/A 03/04/2024   Procedure: CREATION, TRACHEOSTOMY;  Surgeon: [name] Massie RAMAN, MD;  Location: [facility] ORS;  Service: ENT;  Laterality: N/A;    Current Medications: Current Facility-Administered Medications  Medication Dose Route Frequency Provider Last Rate Last Admin   acetaminophen  (TYLENOL ) tablet 650 mg  650 mg Oral Q6H PRN Hampton, Tracie B, NP   650 mg at 09/17/24 2221   alum & mag hydroxide-simeth (MAALOX/MYLANTA) 200-200-20 MG/5ML suspension 30 mL  30 mL Oral Q4H PRN Hampton, Tracie B, NP   30 mL at 08/05/24 0306   amiodarone  (PACERONE ) tablet 400 mg  400 mg Oral Daily [name], [name], MD   400 mg at 09/23/24 1034   ARIPiprazole  ER (ABILIFY  MAINTENA) injection 400 mg  400 mg Intramuscular Q28 days Cythia Bachtel, MD   400 mg at 09/21/24 9080   benzocaine  (ORAJEL) 10 % mucosal gel   Mouth/Throat TID PRN [name], [name], MD       divalproex  (DEPAKOTE  ER) 24 hr tablet 1,000 mg  1,000 mg Oral QHS Oluwaseun Cremer, MD   1,000 mg at 09/23/24 2128   docusate sodium  (COLACE) capsule 100 mg  100 mg Oral Daily Janeen Watson, MD   100 mg at 09/24/24 0940   feeding supplement (ENSURE PLUS HIGH PROTEIN) liquid 237 mL  237 mL Oral TID BM [name], [name], MD    237 mL at 09/24/24 1334   hydrOXYzine  (ATARAX ) tablet 25 mg  25 mg Oral TID PRN Hampton, Tracie B, NP   25 mg at 09/23/24 2128   magnesium  hydroxide (MILK OF MAGNESIA) suspension 30 mL  30 mL Oral Daily PRN Hampton, Tracie B, NP   30 mL at 08/08/24 2233   memantine  (NAMENDA ) tablet 10 mg  10 mg Oral Daily Saba Gomm, MD   10 mg at 09/24/24 0940   metoprolol  tartrate (LOPRESSOR ) tablet 12.5 mg  12.5 mg Oral BID [name], [name], MD   12.5 mg at 09/23/24 2127   naproxen  (NAPROSYN ) tablet 250 mg  250 mg Oral BID WC Kassia Demarinis, MD   250 mg at 09/24/24 9060   OLANZapine  (ZYPREXA ) injection 5 mg  5 mg Intramuscular TID PRN Hampton, Tracie  B, NP       OLANZapine  zydis (ZYPREXA ) disintegrating tablet 5 mg  5 mg Oral TID PRN Hampton, Tracie B, NP   5 mg at 08/04/24 1129   oxyCODONE  (Oxy IR/ROXICODONE ) immediate release tablet 5 mg  5 mg Oral Q4H PRN Dorinda Homans T, MD   5 mg at 09/01/24 2102   pantoprazole  (PROTONIX ) EC tablet 40 mg  40 mg Oral Daily [name], [name], MD   40 mg at 09/24/24 9061   sodium chloride  (OCEAN) 0.65 % nasal spray 1 spray  1 spray Each Nare PRN Jamecia Lerman, MD   1 spray at 08/18/24 1459   traZODone  (DESYREL ) tablet 50 mg  50 mg Oral QHS PRN Hampton, Tracie B, NP   50 mg at 09/23/24 2128    Lab Results:  No results found for this or any previous visit (from the past 48 hours).     Blood Alcohol level:  Lab Results  Component Value Date   [facility] <15 07/17/2024   ETH <15 02/20/2024    Metabolic Disorder Labs: Lab Results  Component Value Date   HGBA1C 5.4 08/12/2024   MPG 108.28 08/12/2024   MPG 119.76 07/14/2018   No results found for: PROLACTIN Lab Results  Component Value Date   CHOL 139 08/12/2024   TRIG 73 08/12/2024   HDL 52 08/12/2024   CHOLHDL 2.7 08/12/2024   VLDL 15 08/12/2024   LDLCALC 72 08/12/2024   LDLCALC 119 (H) 07/13/2018    Physical Findings: AIMS:  , ,  ,  ,    CIWA:    COWS:      Psychiatric Specialty  Exam:  Presentation  General Appearance: Appropriate for Environment  Eye Contact:Fleeting  Speech:Normal Rate  Speech Volume:Decreased    Mood and Affect  Mood:Anxious  Affect:Flat   Thought Process  Thought Processes:circumstantial Orientation:Partial  Thought Content:discharge focused  Hallucinations:denies  Ideas of Reference:Delusions At baseline Suicidal Thoughts:denies  Homicidal Thoughts:denies   Sensorium  Memory:Immediate Poor; Recent Poor; Remote Poor  Judgment:Impaired  Insight:None   Executive Functions  Concentration:Poor  Attention Span:Poor  Recall:Poor  Fund of Knowledge:Poor  Language:Fair   Psychomotor Activity  Psychomotor Activity:No data recorded  Musculoskeletal: Strength & Muscle Tone: within normal limits Gait & Station: normal Assets  Assets:Communication Skills; Desire for Improvement; Social Support    Physical Exam: Physical Exam Vitals and nursing note reviewed.    ROS Blood pressure 104/68, pulse 65, temperature 97.6 F (36.4 C), temperature source Oral, resp. rate 14, height 6' 1 (1.854 m), weight 81.2 kg, SpO2 99%. Body mass index is 23.62 kg/m.  Diagnosis: Active Problems:   Schizophrenia, paranoid (HCC)   Dementia with behavioral disturbance (HCC)   PLAN: Safety and Monitoring:  -- Involuntary admission to inpatient psychiatric unit for safety, stabilization and treatment  -- Daily contact with patient to assess and evaluate symptoms and progress in treatment  -- Patient's case to be discussed in multi-disciplinary team meeting  -- Observation Level : q15 minute checks  -- Vital signs:  q12 hours  -- Precautions: suicide, elopement, and assault -- Encouraged patient to participate in unit milieu and in scheduled group therapies  2. Psychiatric Treatment:  Scheduled Medications:  Discontinued oral Abilify  LAI Abilify  Maintena 400 mg q4 weeks given on 07/27/24, 08/24/2024  Cogentin  1mg  one  dose given for EPS Depakote  ER increased to 1000 mg to help with mood stabilization  Depakote  levels 09/02/24: 57  -- The risks/benefits/side-effects/alternatives to this medication were discussed in detail  with the patient and time was given for questions. The patient consents to medication trial.  3. Medical Issues Being Addressed:  [facility] consulted for wound care management   Assessment and Plan:   Sacral Decubitus Ulcer Full thickness decubitus ulcer in setting of recent extended hospitalization, discharged from [facility] 14th Wound looks pretty good overall WOC on board we appreciate input   Cognitive Deficit Concern for anoxic brain injury at discharge 02/2024 Delirium precautions   Peripheral Vascular Disease Legs with features c/w peripheral vascular disease, diminished DP pulses Would benefit from outpatient ABI's   HFrEF Euvolemic at this time Continue home medications   Hx Atrial Flutter Not on any anticoagulation at this time EKG with NSR Continue amiodarone   Hypertension Noted, adjust meds gradually after med rec complete   Tooth pain/Infection - Added: Augmentin  along with Orajel BP -better today no longer hypotensive-Will continue reduced dose of metoprolol  12.5 bid.   4. Discharge Planning:   -- Social work and case management to assist with discharge planning and identification of hospital follow-up needs prior to discharge  -- Estimated LOS: 3-4 days  Alvon Nygaard, MD 09/24/2024, 3:43 PM

## 2024-09-24 NOTE — Group Note (Signed)
 Recreation Therapy Group Note   Group Topic:Stress Management  Group Date: 09/24/2024 Start Time: 1410 End Time: 1440 Facilitators: Celestia Jeoffrey BRAVO, LRT, CTRS Location: Dayroom  Group Description: PMR (Progressive Muscle Relaxation). LRT educates patients on what PMR is and the benefits that come from it. Patients are asked to sit with their feet flat on the floor while sitting up and all the way back in their chair, if possible. LRT and pts follow a prompt through a speaker that requires you to tense and release different muscles in their body and focus on their breathing. During session, lights are off and soft music is being played. Pts are given a stress ball to use if needed.   Goal Area(s) Addressed:  Patients will be able to describe progressive muscle relaxation.  Patient will practice using relaxation technique. Patient will identify a new coping skill.  Patient will follow multistep directions to reduce anxiety and stress.   Affect/Mood: N/A   Participation Level: Did not attend    Clinical Observations/Individualized Feedback: Patient did not attend.  Plan: Continue to engage patient in RT group sessions 2-3x/week.   Jeoffrey BRAVO Celestia, LRT, CTRS 09/24/2024 4:24 PM

## 2024-09-24 NOTE — B[facility] Group Notes (Signed)
 Spirituality Group   Description: Participant directed exploration of values, beliefs and meaning.  Following a brief framework of chaplains role and ground rules of group behavior, participants are invited to share concerns or questions that engage spiritual life. Emphasis placed on common themes and shared experiences and ways to make meaning and clarify living into ones values.   *Use of conversation activity questions to foster deeper connection   Theory/Process/Goal: Utilize the theoretical framework of group therapy established by Celena Kite, Relational Cultural Theory and Rogerian approaches to facilitate relational empathy and use of the here and now to foster reflection, self-awareness, and sharing.   Observations: Braydan was an active participant in the group discussion. He was more adversarial and affect sullen.  Kymere Fullington L. Delores HERO.Div

## 2024-09-24 NOTE — Progress Notes (Signed)
" °   09/23/24 2333  Psych Admission Type (Psych Patients Only)  Admission Status Involuntary  Psychosocial Assessment  Patient Complaints Confusion  Eye Contact Fair  Facial Expression Animated  Affect Preoccupied  Speech Tangential  Interaction Assertive  Motor Activity Slow  Appearance/Hygiene Layered clothes  Behavior Characteristics Cooperative  Mood Preoccupied  Thought Process  Coherency Tangential  Content Preoccupation  Delusions Paranoid  Perception WDL  Hallucination None reported or observed  Judgment Poor  Confusion Mild  Danger to Self  Current suicidal ideation? Denies  Danger to Others  Danger to Others None reported or observed    Estimated Sleeping Duration (Last 24 Hours): 6.50-8.75 hours  "

## 2024-09-24 NOTE — Group Note (Signed)
 Date:  09/24/2024 Time:  9:47 AM  Group Topic/Focus:  Movement Therapy. Morning Stretch with Comer Devins.    Participation Level:  Did Not Attend    [name] 09/24/2024, 9:47 AM

## 2024-09-24 NOTE — Group Note (Signed)
 Date:  09/24/2024 Time:  8:39 PM  Group Topic/Focus:  Wrap-Up Group:   The focus of this group is to help patients review their daily goal of treatment and discuss progress on daily workbooks.    Participation Level:  Active  Participation Quality:  Appropriate  Affect:  Appropriate  Cognitive:  Appropriate  Insight: Appropriate  Engagement in Group:  Engaged  Modes of Intervention:  Discussion  Additional Comments:    [name] 09/24/2024, 8:39 PM

## 2024-09-24 NOTE — Plan of Care (Signed)
" °  Problem: Activity: Goal: Interest or engagement in activities will improve Outcome: Progressing   Problem: Education: Goal: Mental status will improve Outcome: Progressing   Problem: Activity: Goal: Sleeping patterns will improve Outcome: Progressing   "

## 2024-09-24 NOTE — Progress Notes (Signed)
" °   09/24/24 1300  Psych Admission Type (Psych Patients Only)  Admission Status Involuntary  Psychosocial Assessment  Patient Complaints Confusion  Eye Contact Fair  Facial Expression Animated  Affect Preoccupied  Speech Tangential  Interaction Assertive  Motor Activity Slow  Appearance/Hygiene Layered clothes  Behavior Characteristics Cooperative  Mood Preoccupied  Thought Process  Coherency Tangential  Content Preoccupation  Delusions Paranoid  Perception WDL  Hallucination None reported or observed  Judgment Poor  Confusion Mild  Danger to Self  Current suicidal ideation? Denies  Danger to Others  Danger to Others None reported or observed    "

## 2024-09-25 DIAGNOSIS — F03918 Unspecified dementia, unspecified severity, with other behavioral disturbance: Secondary | ICD-10-CM | POA: Diagnosis not present

## 2024-09-25 DIAGNOSIS — F200 Paranoid schizophrenia: Secondary | ICD-10-CM | POA: Diagnosis not present

## 2024-09-25 NOTE — Progress Notes (Addendum)
(  Sleep Hours) - 6.00 (Any PRNs that were needed, meds refused, or side effects to meds)- Trazadone, Oxycodone : effective (Any disturbances and when (visitation, over night)-Continues to have disruptive sleep, intermittently ambulating to nurses station at least 4 times during the shift. (Concerns raised by the patient)- None reported or observed (SI/HI/AVH)- Denies   09/24/24 2040  Psych Admission Type (Psych Patients Only)  Admission Status Involuntary  Psychosocial Assessment  Patient Complaints Confusion  Eye Contact Fair  Facial Expression Animated  Affect Preoccupied  Speech Tangential  Interaction Assertive  Motor Activity Slow  Appearance/Hygiene Layered clothes  Behavior Characteristics Cooperative  Mood Preoccupied  Aggressive Behavior  Effect No apparent injury  Thought Process  Coherency Tangential  Content Preoccupation  Delusions Paranoid  Perception WDL  Hallucination None reported or observed  Judgment Poor  Confusion Mild  Danger to Self  Current suicidal ideation? Denies  Danger to Others  Danger to Others None reported or observed

## 2024-09-25 NOTE — Plan of Care (Signed)
" °  Problem: Activity: Goal: Interest or engagement in activities will improve Outcome: Not Progressing   Problem: Coping: Goal: Ability to verbalize frustrations and anger appropriately will improve Outcome: Not Progressing   Problem: Education: Goal: Knowledge of [facility] Education information/materials will improve Outcome: Not Progressing Goal: Emotional status will improve Outcome: Not Progressing Goal: Mental status will improve Outcome: Not Progressing   "

## 2024-09-25 NOTE — Group Note (Signed)
 Physical/Occupational Therapy Group Note  Group Topic: Neurographic Art  Group Date: 09/25/2024 Start Time: 1315 End Time: 1400 Facilitators: Clive Warren CROME, OT   Group Description: Group participated with Neurographic art activity, using watercolor paints to facilitate creative expression and meditation/relaxation for each individual.  Incorporated bimanual coordination, mental focus, emotional processing, task/command following and relaxation techniques as appropriate.  Patients engaged socially with therapist and other group participants throughout session. Allowed to ask questions as appropriate, and encouraged to identify ways they could use/share their creations with themselves and others.  Therapeutic Goal(s):  Demonstrate ability to independently manipulate utensils required to participate with and complete activity. Demonstrate ability to cognitively focus on task and follow commands necessary for completion. Demonstrate use of art as an outlet for emotional processing and expression. Identify and demonstrate importance of relaxation, neural calming and meditation for improved participation with life groups.  Individual Participation: Pt present for session but self selected to sit at the end of the table, declining to participate in actual art activity. He engaged in group discussion minimally with some prompting.   Participation Level: Minimal   Participation Quality: Did not participate in activity itself but minimally with group discussion   Behavior: Alert, Calm, and Passive   Speech/Thought Process: Loose association    Affect/Mood: Stable    Insight: Impaired   Judgement: Impaired   Modes of Intervention: Activity, Clarification, Discussion, Education, Exploration, Problem-solving, Rapport Building, Socialization, and Support  Patient Response to Interventions:  Disengaged   Plan: Continue to engage patient in PT/OT groups 1 - 2x/week.  Emiko Osorto R., MPH, MS,  OTR/L ascom [phone] 09/25/24, 4:27 PM

## 2024-09-25 NOTE — Group Note (Signed)
 Date:  09/25/2024 Time:  10:03 AM  Group Topic/Focus:  Recovery Goals:   The focus of this group is to identify appropriate goals for recovery and establish a plan to achieve them.    Participation Level:  Minimal  Participation Quality:  Appropriate  Affect:  Flat  Cognitive:  Alert  Insight: Good  Engagement in Group:  Limited  Modes of Intervention:  Discussion  Additional Comments:     Maglione,Wilbur Labuda E 09/25/2024, 10:03 AM

## 2024-09-25 NOTE — Progress Notes (Signed)
 Patient pleasant during interaction. Had episode of irritability. Patient forgot he had lunch. Was upset about not having lunch. Declined offered snack. Patient took a nap and made no further mention. Ate well for dinner. Accepted medications without issue. Wound care completed. Ongoing monitoring continues.   09/25/24 1100  Psych Admission Type (Psych Patients Only)  Admission Status Involuntary  Psychosocial Assessment  Patient Complaints Confusion  Eye Contact Fair  Facial Expression Animated  Affect Labile  Speech Tangential  Interaction Assertive  Motor Activity Slow  Appearance/Hygiene Layered clothes  Behavior Characteristics Cooperative  Mood Preoccupied  Thought Process  Coherency Tangential  Content Preoccupation  Delusions Paranoid  Perception WDL  Hallucination None reported or observed  Judgment Poor  Confusion WDL  Danger to Self  Current suicidal ideation? Denies  Danger to Others  Danger to Others None reported or observed

## 2024-09-25 NOTE — Plan of Care (Signed)
  Problem: Health Behavior/Discharge Planning: Goal: Identification of resources available to assist in meeting health care needs will improve Outcome: Progressing Goal: Compliance with treatment plan for underlying cause of condition will improve Outcome: Progressing   Problem: Safety: Goal: Periods of time without injury will increase Outcome: Progressing

## 2024-09-25 NOTE — Group Note (Signed)
 Recreation Therapy Group Note   Group Topic:Leisure Education  Group Date: 09/25/2024 Start Time: 1405 End Time: 1455 Facilitators: Celestia Jeoffrey BRAVO, LRT, CTRS Location: Dayroom  Group Description: Leisure. Patients were given the option to choose from journaling, coloring, drawing, making origami, playing with playdoh, listening to music or singing karaoke. LRT and pts discussed the meaning of leisure, the importance of participating in leisure during their free time/when they're outside of the hospital, as well as how our leisure interests can also serve as coping skills.   Goal Area(s) Addressed:  Patient will identify a current leisure interest.  Patient will learn the definition of leisure. Patient will practice making a positive decision. Patient will have the opportunity to try a new leisure activity. Patient will communicate with peers and LRT.    Affect/Mood: N/A   Participation Level: Did not attend    Clinical Observations/Individualized Feedback: Patient did not attend.  Plan: Continue to engage patient in RT group sessions 2-3x/week.   Jeoffrey BRAVO Celestia, LRT, CTRS 09/25/2024 4:32 PM

## 2024-09-25 NOTE — Progress Notes (Signed)
 [facility] MD Progress Note  09/25/2024 11:57 AM Miguel [name]  MRN:  [id number]  Miguel [name] is a 74 year old male with a past psychiatric history significant for schizophrenia who presents under involuntary commitment initiated by family due to medication non-adherence, increasing paranoia, and behavioral disorganization. 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 Brother reports multiple episodes of patient whispering through the night, stating people were talking about him and planning to harm him. Patient was advised by his ACT Team to come to the ED; he refused, prompting the family to pursue IVC.Patient is admitted to [facility] unit with Q15 min safety monitoring. Multidisciplinary team approach is offered. Medication management; group/milieu therapy is offered.   Subjective:  Chart reviewed, case discussed in multidisciplinary meeting, patient seen during rounds.   Patient is noted to be sitting in the chair in the dayroom and nodding off.  He remains confused and disorganized.  He offers no complaints but talks about not having enough food and spite of having lunch 30 minutes ago.  He is not responding to internal stimuli and denies hallucinations.  He denies feeling depressed or anxious.  Past Psychiatric History: see h&P Family History:  Family History  Family history unknown: Yes   Social History:  Social History   Substance and Sexual Activity  Alcohol Use No     Social History   Substance and Sexual Activity  Drug Use No    Social History   Socioeconomic  History   Marital status: Divorced    Spouse name: Not on file   Number of children: Not on file   Years of education: Not on file   Highest education level: Not on file  Occupational History   Not on file  Tobacco Use   Smoking status: Every Day    Current packs/day: 0.50    Average packs/day: 0.5 packs/day for 46.1 years (23.0 ttl pk-yrs)    Types: Cigarettes    Start date: 41   Smokeless tobacco: Never  Vaping Use   Vaping status: Never Used  Substance and Sexual Activity   Alcohol use: No   Drug use: No   Sexual activity: Not on file  Other Topics Concern   Not on file  Social History Narrative   Not on file   Social Drivers of Health   Tobacco Use: High Risk (07/18/2024)   Patient History    Smoking Tobacco Use: Every Day    Smokeless Tobacco Use: Never    Passive Exposure: Not on file  Financial Resource Strain: Not on file  Food Insecurity: Patient Unable To Answer (07/18/2024)   Epic    Worried About Programme Researcher, Broadcasting/film/video in the Last Year: Patient unable to answer    Ran Out of Food in the Last Year: Patient unable to answer  Transportation Needs: Unmet Transportation Needs (07/18/2024)   Epic    Lack of Transportation (Medical): Yes    Lack of Transportation (Non-Medical): Yes  Physical Activity: Not on file  Stress: Not on file  Social Connections: Patient Unable To Answer (07/18/2024)   Social Connection and Isolation Panel    Frequency of Communication with Friends and Family: Patient unable to answer  Frequency of Social Gatherings with Friends and Family: Patient unable to answer    Attends Religious Services: Patient unable to answer    Active Member of Clubs or Organizations: Patient unable to answer    Attends Club or Organization Meetings: Patient unable to answer    Marital Status: Patient unable to answer  Depression (PHQ2-9): Not on file  Alcohol Screen: Low Risk (07/18/2024)   Alcohol Screen    Last Alcohol Screening Score (AUDIT): 0   Housing: Unknown (07/18/2024)   Epic    Unable to Pay for Housing in the Last Year: Patient unable to answer    Number of Times Moved in the Last Year: 0    Homeless in the Last Year: Patient declined  Utilities: Patient Unable To Answer (07/18/2024)   Epic    Threatened with loss of utilities: Patient unable to answer  Health Literacy: Not on file   Past Medical History:  Past Medical History:  Diagnosis Date   Biventricular failure (HCC)    CVA (cerebral vascular accident) (HCC)    Polysubstance use disorder    Schizophrenia (HCC)     Past Surgical History:  Procedure Laterality Date   APPENDECTOMY     TRACHEOSTOMY TUBE PLACEMENT N/A 03/04/2024   Procedure: CREATION, TRACHEOSTOMY;  Surgeon: [name] Massie RAMAN, MD;  Location: [facility] ORS;  Service: ENT;  Laterality: N/A;    Current Medications: Current Facility-Administered Medications  Medication Dose Route Frequency Provider Last Rate Last Admin   acetaminophen  (TYLENOL ) tablet 650 mg  650 mg Oral Q6H PRN Hampton, Tracie B, NP   650 mg at 09/17/24 2221   alum & mag hydroxide-simeth (MAALOX/MYLANTA) 200-200-20 MG/5ML suspension 30 mL  30 mL Oral Q4H PRN Hampton, Tracie B, NP   30 mL at 08/05/24 0306   amiodarone  (PACERONE ) tablet 400 mg  400 mg Oral Daily [name], [name], MD   400 mg at 09/25/24 0948   ARIPiprazole  ER (ABILIFY  MAINTENA) injection 400 mg  400 mg Intramuscular Q28 days Kanaan Kagawa, MD   400 mg at 09/21/24 0919   benzocaine  (ORAJEL) 10 % mucosal gel   Mouth/Throat TID PRN [name], [name], MD       divalproex  (DEPAKOTE  ER) 24 hr tablet 1,000 mg  1,000 mg Oral QHS Luke Rigsbee, MD   1,000 mg at 09/24/24 2048   docusate sodium  (COLACE) capsule 100 mg  100 mg Oral Daily Greta Yung, MD   100 mg at 09/25/24 0948   feeding supplement (ENSURE PLUS HIGH PROTEIN) liquid 237 mL  237 mL Oral TID BM [name], [name], MD   237 mL at 09/25/24 0948   hydrOXYzine  (ATARAX ) tablet 25 mg  25 mg Oral TID PRN Hampton, Tracie B,  NP   25 mg at 09/23/24 2128   magnesium  hydroxide (MILK OF MAGNESIA) suspension 30 mL  30 mL Oral Daily PRN Hampton, Tracie B, NP   30 mL at 08/08/24 2233   memantine  (NAMENDA ) tablet 10 mg  10 mg Oral Daily Tra Wilemon, MD   10 mg at 09/25/24 9051   metoprolol  tartrate (LOPRESSOR ) tablet 12.5 mg  12.5 mg Oral BID [name], [name], MD   12.5 mg at 09/23/24 2127   naproxen  (NAPROSYN ) tablet 250 mg  250 mg Oral BID WC Ymani Porcher, MD   250 mg at 09/25/24 9257   OLANZapine  (ZYPREXA ) injection 5 mg  5 mg Intramuscular TID PRN Hampton, Tracie B, NP       OLANZapine  zydis (ZYPREXA ) disintegrating tablet 5  mg  5 mg Oral TID PRN Hampton, Tracie B, NP   5 mg at 08/04/24 1129   oxyCODONE  (Oxy IR/ROXICODONE ) immediate release tablet 5 mg  5 mg Oral Q4H PRN Dorinda Homans T, MD   5 mg at 09/24/24 2045   pantoprazole  (PROTONIX ) EC tablet 40 mg  40 mg Oral Daily [name], [name], MD   40 mg at 09/25/24 0948   sodium chloride  (OCEAN) 0.65 % nasal spray 1 spray  1 spray Each Nare PRN Flannery Cavallero, MD   1 spray at 08/18/24 1459   traZODone  (DESYREL ) tablet 50 mg  50 mg Oral QHS PRN Hampton, Tracie B, NP   50 mg at 09/23/24 2128    Lab Results:  No results found for this or any previous visit (from the past 48 hours).     Blood Alcohol level:  Lab Results  Component Value Date   [facility] <15 07/17/2024   ETH <15 02/20/2024    Metabolic Disorder Labs: Lab Results  Component Value Date   HGBA1C 5.4 08/12/2024   MPG 108.28 08/12/2024   MPG 119.76 07/14/2018   No results found for: PROLACTIN Lab Results  Component Value Date   CHOL 139 08/12/2024   TRIG 73 08/12/2024   HDL 52 08/12/2024   CHOLHDL 2.7 08/12/2024   VLDL 15 08/12/2024   LDLCALC 72 08/12/2024   LDLCALC 119 (H) 07/13/2018    Physical Findings: AIMS:  , ,  ,  ,    CIWA:    COWS:      Psychiatric Specialty Exam:  Presentation  General Appearance: Appropriate for Environment  Eye Contact:Fleeting  Speech:Normal  Rate  Speech Volume:Decreased    Mood and Affect  Mood:Anxious  Affect:Flat   Thought Process  Thought Processes:circumstantial Orientation:Partial  Thought Content:discharge focused  Hallucinations:denies  Ideas of Reference:Delusions At baseline Suicidal Thoughts:denies  Homicidal Thoughts:denies   Sensorium  Memory:Immediate Poor; Recent Poor; Remote Poor  Judgment:Impaired  Insight:None   Executive Functions  Concentration:Poor  Attention Span:Poor  Recall:Poor  Fund of Knowledge:Poor  Language:Fair   Psychomotor Activity  Psychomotor Activity:No data recorded  Musculoskeletal: Strength & Muscle Tone: within normal limits Gait & Station: normal Assets  Assets:Communication Skills; Desire for Improvement; Social Support    Physical Exam: Physical Exam Vitals and nursing note reviewed.    ROS Blood pressure 113/64, pulse 63, temperature (!) 97.2 F (36.2 C), resp. rate 18, height 6' 1 (1.854 m), weight 81.2 kg, SpO2 96%. Body mass index is 23.62 kg/m.  Diagnosis: Active Problems:   Schizophrenia, paranoid (HCC)   Dementia with behavioral disturbance (HCC)   PLAN: Safety and Monitoring:  -- Involuntary admission to inpatient psychiatric unit for safety, stabilization and treatment  -- Daily contact with patient to assess and evaluate symptoms and progress in treatment  -- Patient's case to be discussed in multi-disciplinary team meeting  -- Observation Level : q15 minute checks  -- Vital signs:  q12 hours  -- Precautions: suicide, elopement, and assault -- Encouraged patient to participate in unit milieu and in scheduled group therapies  2. Psychiatric Treatment:  Scheduled Medications:  Discontinued oral Abilify  LAI Abilify  Maintena 400 mg q4 weeks given on 07/27/24, 08/24/2024  Cogentin  1mg  one dose given for EPS Depakote  ER increased to 1000 mg to help with mood stabilization  Depakote  levels 09/02/24: 57  -- The  risks/benefits/side-effects/alternatives to this medication were discussed in detail with the patient and time was given for questions. The patient consents to medication trial.  3. Medical Issues Being Addressed:  [facility] consulted for wound care management   Assessment and Plan:   Sacral Decubitus Ulcer Full thickness decubitus ulcer in setting of recent extended hospitalization, discharged from [facility] 14th Wound looks pretty good overall WOC on board we appreciate input   Cognitive Deficit Concern for anoxic brain injury at discharge 02/2024 Delirium precautions   Peripheral Vascular Disease Legs with features c/w peripheral vascular disease, diminished DP pulses Would benefit from outpatient ABI's   HFrEF Euvolemic at this time Continue home medications   Hx Atrial Flutter Not on any anticoagulation at this time EKG with NSR Continue amiodarone   Hypertension Noted, adjust meds gradually after med rec complete   Tooth pain/Infection - Added: Augmentin  along with Orajel BP -better today no longer hypotensive-Will continue reduced dose of metoprolol  12.5 bid.   4. Discharge Planning:   -- Social work and case management to assist with discharge planning and identification of hospital follow-up needs prior to discharge  -- Estimated LOS: 3-4 days  Moshe Wenger, MD 09/25/2024, 11:57 AM

## 2024-09-26 NOTE — Progress Notes (Signed)
 Behavior:  Confused, but cooperative.    Psych assessment: Denies SI/HI and AVH.  Group attendance:  1/2  Medication/ PRNs: Cardiac meds held due to low diastolic BP.  Dr. Ruther aware. Compliant with all other  medications.   Pain: Denies.  15 min checks in place for safety.    Dr. Ruther placed hospitalist consult due to low diastolic BP.   Wound cleaned and dressing changed per wound care orders.

## 2024-09-26 NOTE — Plan of Care (Signed)
" °  Problem: Activity: Goal: Interest or engagement in activities will improve Outcome: Progressing   Problem: Coping: Goal: Ability to verbalize frustrations and anger appropriately will improve Outcome: Progressing   Problem: Education: Goal: Emotional status will improve Outcome: Progressing Goal: Mental status will improve Outcome: Progressing Goal: Verbalization of understanding the information provided will improve Outcome: Progressing   "

## 2024-09-26 NOTE — Group Note (Signed)
 Date:  09/26/2024 Time:  8:52 PM  Group Topic/Focus:  Managing Feelings:   The focus of this group is to identify what feelings patients have difficulty handling and develop a plan to handle them in a healthier way upon discharge.    Participation Level:  Active  Participation Quality:  Appropriate and Attentive  Affect:  Appropriate  Cognitive:  Appropriate  Insight: Good  Engagement in Group:  Engaged  Modes of Intervention:  Activity and Discussion  Additional Comments:    Miguel [name] K 09/26/2024, 8:52 PM

## 2024-09-26 NOTE — Progress Notes (Signed)
 Pt still not sleeping well at night. Pt denies SI/HI/AVH. He declined physical complaints this shift.    09/25/24 2103  Psych Admission Type (Psych Patients Only)  Admission Status Involuntary  Psychosocial Assessment  Patient Complaints Disorientation  Eye Contact Fair  Facial Expression Animated  Affect Appropriate to circumstance  Speech Tangential  Interaction Assertive  Motor Activity Slow  Appearance/Hygiene Layered clothes  Behavior Characteristics Appropriate to situation  Mood Pleasant  Thought Process  Coherency Tangential  Content Confabulation  Delusions None reported or observed  Perception WDL  Hallucination None reported or observed  Judgment Poor  Confusion Mild  Danger to Self  Current suicidal ideation? Denies  Danger to Others  Danger to Others None reported or observed

## 2024-09-26 NOTE — Group Note (Signed)
 Date:  09/26/2024 Time:  5:44 AM  Group Topic/Focus:  Wrap-Up Group:   The focus of this group is to help patients review their daily goal of treatment and discuss progress on daily workbooks.    Participation Level:  Active  Participation Quality:  Appropriate  Affect:  Appropriate  Cognitive:  Appropriate  Insight: Appropriate  Engagement in Group:  Engaged  Modes of Intervention:  Discussion  Additional Comments:    Irl Bodie C Staci Dack 09/26/2024, 5:44 AM

## 2024-09-26 NOTE — Group Note (Signed)
 Date:  09/26/2024 Time:  11:56 AM  Group Topic/Focus:  Goals Group:   The focus of this group is to help patients establish daily goals to achieve during treatment and discuss how the patient can incorporate goal setting into their daily lives to aide in recovery.    Participation Level:  Minimal  Participation Quality:  Appropriate  Affect:  Flat and Lethargic  Cognitive:  Alert  Insight: Good  Engagement in Group:  Limited  Modes of Intervention:  Discussion  Additional Comments:     Maglione,Deshara Rossi E 09/26/2024, 11:56 AM

## 2024-09-26 NOTE — Group Note (Signed)
 Date:  09/26/2024 Time:  2:08 PM  Group Topic/Focus:  Movement Therapy    Participation Level:  Did Not Attend    [name] 09/26/2024, 2:08 PM

## 2024-09-26 NOTE — Plan of Care (Signed)
  Problem: Activity: Goal: Interest or engagement in activities will improve Outcome: Progressing   Problem: Education: Goal: Mental status will improve Outcome: Not Progressing   Problem: Activity: Goal: Sleeping patterns will improve Outcome: Not Progressing

## 2024-09-27 DIAGNOSIS — F03918 Unspecified dementia, unspecified severity, with other behavioral disturbance: Secondary | ICD-10-CM | POA: Diagnosis not present

## 2024-09-27 DIAGNOSIS — F200 Paranoid schizophrenia: Secondary | ICD-10-CM | POA: Diagnosis not present

## 2024-09-27 NOTE — Group Note (Signed)
 Date:  09/27/2024 Time:  9:24 PM  Group Topic/Focus:  Wrap-Up Group:   The focus of this group is to help patients review their daily goal of treatment and discuss progress on daily workbooks.    Participation Level:  Minimal  Participation Quality:  Attentive  Affect:  Appropriate  Cognitive:  Oriented  Insight: Limited  Engagement in Group:  Limited  Modes of Intervention:  Discussion  Additional Comments:    Miguel Miguel [name] 09/27/2024, 9:24 PM

## 2024-09-27 NOTE — Progress Notes (Signed)
 [facility] MD Progress Note  09/27/2024 1:50 PM Miguel [name]  MRN:  [id number]  Miguel [name] is a 74 year old male with a past psychiatric history significant for schizophrenia who presents under involuntary commitment initiated by family due to medication non-adherence, increasing paranoia, and behavioral disorganization. 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 Miguel [name] reports multiple episodes of patient whispering through the night, stating people were talking about him and planning to harm him. Patient was advised by his ACT Team to come to the ED; he refused, prompting the family to pursue IVC.Patient is admitted to [facility] unit with Q15 min safety monitoring. Multidisciplinary team approach is offered. Medication management; group/milieu therapy is offered.   Subjective:  Chart reviewed, case discussed in multidisciplinary meeting, patient seen during rounds.   Patient is noted to be standing at the nurses station.  He remains confused and disorganized thought process as he continues to talk about random topics that did not happen or exist on the unit.  He talks about people taking away his money and giving small amount of money to him.  He talks about his family members saying different things to him even though in fact they never visited him.  He denies auditory/visual hallucinations and denies SI/HI/plan.  Per nursing he is taking his medications.  Patient has chronic low blood pressure and sometimes bradycardic and his blood pressure medications are being held  accordingly.  Past Psychiatric History: see h&P Family History:  Family History  Family history unknown: Yes   Social History:  Social History   Substance and Sexual Activity  Alcohol Use No     Social History   Substance and Sexual Activity  Drug Use No    Social History   Socioeconomic History   Marital status: Divorced    Spouse name: Not on file   Number of children: Not on file   Years of education: Not on file   Highest education level: Not on file  Occupational History   Not on file  Tobacco Use   Smoking status: Every Day    Current packs/day: 0.50    Average packs/day: 0.5 packs/day for 46.1 years (23.0 ttl pk-yrs)    Types: Cigarettes    Start date: 10   Smokeless tobacco: Never  Vaping Use   Vaping status: Never Used  Substance and Sexual Activity   Alcohol use: No   Drug use: No   Sexual activity: Not on file  Other Topics Concern   Not on file  Social History Narrative   Not on file   Social Drivers of Health   Tobacco Use: High Risk (07/18/2024)   Patient History    Smoking Tobacco Use: Every Day    Smokeless Tobacco Use: Never    Passive Exposure: Not on file  Financial Resource Strain: Not on file  Food Insecurity: Patient Unable To Answer (07/18/2024)   Epic    Worried About Programme Researcher, Broadcasting/film/video in the Last Year: Patient unable to answer    Ran Out of Food in the Last Year: Patient unable to answer  Transportation Needs: Unmet Transportation Needs (07/18/2024)   Epic    Lack of Transportation (Medical): Yes  Lack of Transportation (Non-Medical): Yes  Physical Activity: Not on file  Stress: Not on file  Social Connections: Patient Unable To Answer (07/18/2024)   Social Connection and Isolation Panel    Frequency of Communication with Friends and Family: Patient unable to answer    Frequency of Social Gatherings with Friends and Family: Patient unable to answer    Attends Religious Services: Patient unable to answer    Active  Member of Clubs or Organizations: Patient unable to answer    Attends Banker Meetings: Patient unable to answer    Marital Status: Patient unable to answer  Depression (PHQ2-9): Not on file  Alcohol Screen: Low Risk (07/18/2024)   Alcohol Screen    Last Alcohol Screening Score (AUDIT): 0  Housing: Unknown (07/18/2024)   Epic    Unable to Pay for Housing in the Last Year: Patient unable to answer    Number of Times Moved in the Last Year: 0    Homeless in the Last Year: Patient declined  Utilities: Patient Unable To Answer (07/18/2024)   Epic    Threatened with loss of utilities: Patient unable to answer  Health Literacy: Not on file   Past Medical History:  Past Medical History:  Diagnosis Date   Biventricular failure (HCC)    CVA (cerebral vascular accident) (HCC)    Polysubstance use disorder    Schizophrenia (HCC)     Past Surgical History:  Procedure Laterality Date   APPENDECTOMY     TRACHEOSTOMY TUBE PLACEMENT N/A 03/04/2024   Procedure: CREATION, TRACHEOSTOMY;  Surgeon: [name] Massie RAMAN, MD;  Location: [facility] ORS;  Service: ENT;  Laterality: N/A;    Current Medications: Current Facility-Administered Medications  Medication Dose Route Frequency Provider Last Rate Last Admin   acetaminophen  (TYLENOL ) tablet 650 mg  650 mg Oral Q6H PRN Hampton, Tracie B, NP   650 mg at 09/17/24 2221   alum & mag hydroxide-simeth (MAALOX/MYLANTA) 200-200-20 MG/5ML suspension 30 mL  30 mL Oral Q4H PRN Hampton, Tracie B, NP   30 mL at 08/05/24 0306   amiodarone  (PACERONE ) tablet 400 mg  400 mg Oral Daily [name], [name], MD   400 mg at 09/25/24 0948   ARIPiprazole  ER (ABILIFY  MAINTENA) injection 400 mg  400 mg Intramuscular Q28 days Quinette Hentges, MD   400 mg at 09/21/24 0919   benzocaine  (ORAJEL) 10 % mucosal gel   Mouth/Throat TID PRN [name], [name], MD       divalproex  (DEPAKOTE  ER) 24 hr tablet 1,000 mg  1,000 mg Oral QHS Hokulani Rogel, MD   1,000 mg at 09/26/24 2112    docusate sodium  (COLACE) capsule 100 mg  100 mg Oral Daily Harjot Zavadil, MD   100 mg at 09/27/24 0913   feeding supplement (ENSURE PLUS HIGH PROTEIN) liquid 237 mL  237 mL Oral TID BM [name], [name], MD   237 mL at 09/27/24 0933   hydrOXYzine  (ATARAX ) tablet 25 mg  25 mg Oral TID PRN Hampton, Tracie B, NP   25 mg at 09/25/24 2103   magnesium  hydroxide (MILK OF MAGNESIA) suspension 30 mL  30 mL Oral Daily PRN Hampton, Tracie B, NP   30 mL at 08/08/24 2233   memantine  (NAMENDA ) tablet 10 mg  10 mg Oral Daily Amiliah Campisi, MD   10 mg at 09/27/24 9087   metoprolol  tartrate (LOPRESSOR ) tablet 12.5 mg  12.5 mg Oral BID [name], [name], MD   12.5 mg at 09/25/24 2103   naproxen  (NAPROSYN )  tablet 250 mg  250 mg Oral BID WC Breniya Goertzen, MD   250 mg at 09/27/24 9087   OLANZapine  (ZYPREXA ) injection 5 mg  5 mg Intramuscular TID PRN Hampton, Tracie B, NP       OLANZapine  zydis (ZYPREXA ) disintegrating tablet 5 mg  5 mg Oral TID PRN Hampton, Tracie B, NP   5 mg at 08/04/24 1129   oxyCODONE  (Oxy IR/ROXICODONE ) immediate release tablet 5 mg  5 mg Oral Q4H PRN Dorinda Homans T, MD   5 mg at 09/24/24 2045   pantoprazole  (PROTONIX ) EC tablet 40 mg  40 mg Oral Daily [name], [name], MD   40 mg at 09/27/24 0913   sodium chloride  (OCEAN) 0.65 % nasal spray 1 spray  1 spray Each Nare PRN Donnelly Mellow, MD   1 spray at 08/18/24 1459   traZODone  (DESYREL ) tablet 50 mg  50 mg Oral QHS PRN Hampton, Tracie B, NP   50 mg at 09/26/24 2112    Lab Results:  No results found for this or any previous visit (from the past 48 hours).     Blood Alcohol level:  Lab Results  Component Value Date   [facility] <15 07/17/2024   ETH <15 02/20/2024    Metabolic Disorder Labs: Lab Results  Component Value Date   HGBA1C 5.4 08/12/2024   MPG 108.28 08/12/2024   MPG 119.76 07/14/2018   No results found for: PROLACTIN Lab Results  Component Value Date   CHOL 139 08/12/2024   TRIG 73 08/12/2024   HDL 52  08/12/2024   CHOLHDL 2.7 08/12/2024   VLDL 15 08/12/2024   LDLCALC 72 08/12/2024   LDLCALC 119 (H) 07/13/2018    Physical Findings: AIMS:  , ,  ,  ,    CIWA:    COWS:      Psychiatric Specialty Exam:  Presentation  General Appearance: Appropriate for Environment  Eye Contact:Fleeting  Speech:Normal Rate  Speech Volume:Decreased    Mood and Affect  Mood:Anxious  Affect:Flat   Thought Process  Thought Processes:circumstantial Orientation:Partial  Thought Content:discharge focused  Hallucinations:denies  Ideas of Reference:Delusions At baseline Suicidal Thoughts:denies  Homicidal Thoughts:denies   Sensorium  Memory:Immediate Poor; Recent Poor; Remote Poor  Judgment:Impaired  Insight:None   Executive Functions  Concentration:Poor  Attention Span:Poor  Recall:Poor  Fund of Knowledge:Poor  Language:Fair   Psychomotor Activity  Psychomotor Activity:No data recorded  Musculoskeletal: Strength & Muscle Tone: within normal limits Gait & Station: normal Assets  Assets:Communication Skills; Desire for Improvement; Social Support    Physical Exam: Physical Exam Vitals and nursing note reviewed.    ROS Blood pressure (!) 100/51, pulse 67, temperature (!) 97 F (36.1 C), resp. rate 16, height 6' 1 (1.854 m), weight 81.2 kg, SpO2 96%. Body mass index is 23.62 kg/m.  Diagnosis: Active Problems:   Schizophrenia, paranoid (HCC)   Dementia with behavioral disturbance (HCC)   PLAN: Safety and Monitoring:  -- Involuntary admission to inpatient psychiatric unit for safety, stabilization and treatment  -- Daily contact with patient to assess and evaluate symptoms and progress in treatment  -- Patient's case to be discussed in multi-disciplinary team meeting  -- Observation Level : q15 minute checks  -- Vital signs:  q12 hours  -- Precautions: suicide, elopement, and assault -- Encouraged patient to participate in unit milieu and in  scheduled group therapies  2. Psychiatric Treatment:  Scheduled Medications:  Discontinued oral Abilify  LAI Abilify  Maintena 400 mg q4 weeks given on 07/27/24, 08/24/2024  Cogentin   1mg  one dose given for EPS Depakote  ER increased to 1000 mg to help with mood stabilization  Depakote  levels 09/02/24: 57  -- The risks/benefits/side-effects/alternatives to this medication were discussed in detail with the patient and time was given for questions. The patient consents to medication trial.  3. Medical Issues Being Addressed:  [facility] consulted for wound care management   Assessment and Plan:   Sacral Decubitus Ulcer Full thickness decubitus ulcer in setting of recent extended hospitalization, discharged from [facility] Wound looks pretty good overall WOC on board we appreciate input   Cognitive Deficit Concern for anoxic brain injury at discharge 02/2024 Delirium precautions   Peripheral Vascular Disease Legs with features c/w peripheral vascular disease, diminished DP pulses Would benefit from outpatient ABI's   HFrEF Euvolemic at this time Continue home medications   Hx Atrial Flutter Not on any anticoagulation at this time EKG with NSR Continue amiodarone   Hypertension Noted, adjust meds gradually after med rec complete   Tooth pain/Infection - Added: Augmentin  along with Orajel BP -better today no longer hypotensive-Will continue reduced dose of metoprolol  12.5 bid.   4. Discharge Planning:   -- Social work and case management to assist with discharge planning and identification of hospital follow-up needs prior to discharge  -- Estimated LOS: 3-4 days  Norbert Malkin, MD 09/27/2024, 1:50 PM

## 2024-09-27 NOTE — BH IP Treatment Plan (Signed)
 Interdisciplinary Treatment and Diagnostic Plan Update  09/27/2024 Time of Session: 4:03 pm ADRIANE GUGLIELMO MRN: [id number]  Principal Diagnosis: <principal problem not specified>  Secondary Diagnoses: Active Problems:   Schizophrenia, paranoid (HCC)   Dementia with behavioral disturbance (HCC)   Current Medications:  Current Facility-Administered Medications  Medication Dose Route Frequency Provider Last Rate Last Admin   acetaminophen  (TYLENOL ) tablet 650 mg  650 mg Oral Q6H PRN Hampton, Tracie B, NP   650 mg at 09/17/24 2221   alum & mag hydroxide-simeth (MAALOX/MYLANTA) 200-200-20 MG/5ML suspension 30 mL  30 mL Oral Q4H PRN Hampton, Tracie B, NP   30 mL at 08/05/24 0306   amiodarone  (PACERONE ) tablet 400 mg  400 mg Oral Daily [name], [name], MD   400 mg at 09/25/24 9051   ARIPiprazole  ER (ABILIFY  MAINTENA) injection 400 mg  400 mg Intramuscular Q28 days [name], [name], MD   400 mg at 09/21/24 0919   benzocaine  (ORAJEL) 10 % mucosal gel   Mouth/Throat TID PRN [name], [name], MD       divalproex  (DEPAKOTE  ER) 24 hr tablet 1,000 mg  1,000 mg Oral QHS [name], [name], MD   1,000 mg at 09/26/24 2112   docusate sodium  (COLACE) capsule 100 mg  100 mg Oral Daily [name], [name], MD   100 mg at 09/27/24 0913   feeding supplement (ENSURE PLUS HIGH PROTEIN) liquid 237 mL  237 mL Oral TID BM [name], [name], MD   237 mL at 09/27/24 1425   hydrOXYzine  (ATARAX ) tablet 25 mg  25 mg Oral TID PRN Hampton, Tracie B, NP   25 mg at 09/25/24 2103   magnesium  hydroxide (MILK OF MAGNESIA) suspension 30 mL  30 mL Oral Daily PRN Hampton, Tracie B, NP   30 mL at 08/08/24 2233   memantine  (NAMENDA ) tablet 10 mg  10 mg Oral Daily [name], [name], MD   10 mg at 09/27/24 9087   metoprolol  tartrate (LOPRESSOR ) tablet 12.5 mg  12.5 mg Oral BID [name], [name], MD   12.5 mg at 09/25/24 2103   naproxen  (NAPROSYN ) tablet 250 mg  250 mg Oral BID WC [name], [name], MD   250 mg at 09/27/24 0912   OLANZapine   (ZYPREXA ) injection 5 mg  5 mg Intramuscular TID PRN Hampton, Tracie B, NP       OLANZapine  zydis (ZYPREXA ) disintegrating tablet 5 mg  5 mg Oral TID PRN Hampton, Tracie B, NP   5 mg at 08/04/24 1129   oxyCODONE  (Oxy IR/ROXICODONE ) immediate release tablet 5 mg  5 mg Oral Q4H PRN [name], [name], MD   5 mg at 09/24/24 2045   pantoprazole  (PROTONIX ) EC tablet 40 mg  40 mg Oral Daily [name], [name], MD   40 mg at 09/27/24 0913   sodium chloride  (OCEAN) 0.65 % nasal spray 1 spray  1 spray Each Nare PRN Donnelly Mellow, MD   1 spray at 08/18/24 1459   traZODone  (DESYREL ) tablet 50 mg  50 mg Oral QHS PRN Hampton, Tracie B, NP   50 mg at 09/26/24 2112   PTA Medications: Medications Prior to Admission  Medication Sig Dispense Refill Last Dose/Taking   amiodarone  (PACERONE ) 400 MG tablet Place 1 tablet (400 mg total) into feeding tube daily.      ARIPiprazole  (ABILIFY ) 30 MG tablet Place 1 tablet (30 mg total) into feeding tube at bedtime.      bisacodyl  (DULCOLAX) 10 MG suppository Place 1 suppository (10 mg total) rectally daily as needed  for moderate constipation or severe constipation.      busPIRone  (BUSPAR ) 5 MG tablet Take 5 mg by mouth 3 (three) times daily.      Chlorhexidine  Gluconate Cloth 2 % PADS Apply 6 each topically daily.      digoxin  (LANOXIN ) 0.125 MG tablet 1 tablet (0.125 mg total) by Per NG tube route daily. (Patient taking differently: Take 0.125 mg by mouth every other day.)      docusate (COLACE) 50 MG/5ML liquid Place 10 mLs (100 mg total) into feeding tube 2 (two) times daily.      metoprolol  tartrate (LOPRESSOR ) 25 MG tablet Take 25 mg by mouth 2 (two) times daily.      pantoprazole  (PROTONIX ) 40 MG tablet Take 40 mg by mouth daily.      traMADol  (ULTRAM ) 50 MG tablet Take 50 mg by mouth 2 (two) times daily.       Patient Stressors: Educational concerns   Health problems   Medication change or noncompliance   Other: Stated transitional housing concerns    Patient  Strengths: Manufacturing systems engineer  Religious Affiliation   Treatment Modalities: Medication Management, Group therapy, Case management,  1 to 1 session with clinician, Psychoeducation, Recreational therapy.   Physician Treatment Plan for Primary Diagnosis: <principal problem not specified> Long Term Goal(s):     Short Term Goals:    Medication Management: Evaluate patient's response, side effects, and tolerance of medication regimen.  Therapeutic Interventions: 1 to 1 sessions, Unit Group sessions and Medication administration.  Evaluation of Outcomes: Adequate for Discharge  Physician Treatment Plan for Secondary Diagnosis: Active Problems:   Schizophrenia, paranoid (HCC)   Dementia with behavioral disturbance (HCC)  Long Term Goal(s):     Short Term Goals:       Medication Management: Evaluate patient's response, side effects, and tolerance of medication regimen.  Therapeutic Interventions: 1 to 1 sessions, Unit Group sessions and Medication administration.  Evaluation of Outcomes: Adequate for Discharge   RN Treatment Plan for Primary Diagnosis: <principal problem not specified> Long Term Goal(s): Knowledge of disease and therapeutic regimen to maintain health will improve  Short Term Goals: Ability to remain free from injury will improve, Ability to verbalize frustration and anger appropriately will improve, Ability to demonstrate self-control, Ability to participate in decision making will improve, and Ability to verbalize feelings will improve  Medication Management: RN will administer medications as ordered by provider, will assess and evaluate patient's response and provide education to patient for prescribed medication. RN will report any adverse and/or side effects to prescribing provider.  Therapeutic Interventions: 1 on 1 counseling sessions, Psychoeducation, Medication administration, Evaluate responses to treatment, Monitor vital signs and CBGs as ordered,  Perform/monitor CIWA, COWS, AIMS and Fall Risk screenings as ordered, Perform wound care treatments as ordered.  Evaluation of Outcomes: Adequate for Discharge   LCSW Treatment Plan for Primary Diagnosis: <principal problem not specified> Long Term Goal(s): Safe transition to appropriate next level of care at discharge, Engage patient in therapeutic group addressing interpersonal concerns.  Short Term Goals: Engage patient in aftercare planning with referrals and resources, Increase social support, Increase ability to appropriately verbalize feelings, and Increase emotional regulation  Therapeutic Interventions: Assess for all discharge needs, 1 to 1 time with Social worker, Explore available resources and support systems, Assess for adequacy in community support network, Educate family and significant other(s) on suicide prevention, Complete Psychosocial Assessment, Interpersonal group therapy.  Evaluation of Outcomes: Adequate for Discharge   Progress in Treatment: Attending groups: Yes.  Participating in groups: Yes. and No. Taking medication as prescribed: Yes. Toleration medication: Yes. Family/Significant other contact made: Yes, individual(s) contacted:  Completed with the patient brother Patient understands diagnosis: No. Discussing patient identified problems/goals with staff: Yes. Medical problems stabilized or resolved: Yes. Denies suicidal/homicidal ideation: Yes. Issues/concerns per patient self-inventory: No. Other: None  New problem(s) identified: No, Describe:  None Update 07/26/24: No changes at this time Update 08/01/24: No changes at this time  Update 08/06/24: No changes at this time 08/11/24 Update: No changes at this time.  Update 08/16/24: No changes at this time. Update 12/262025:  No changes at this time. Update 08/26/2024:  No changes at this time. Update 08/31/2024:  No changes at this time. Update: 09/05/2024  Update 09/10/2024:  No changes at this time. Update  09/15/2024: No changes at this time. Update 09/21/2024: No changes at this time.  Update 09/27/2024: No changes at this time   New Short Term/Long Term Goal(s):detox, elimination of symptoms of psychosis, medication management for mood stabilization; elimination of SI thoughts; development of comprehensive mental wellness/sobriety plan.  Update 07/26/24: No changes at this time Update 08/01/24: No changes at this time Update 08/06/24: No changes at this time  10/13/23 Update: No changes at this time. Update 08/16/24: No changes at this time.  Update 12/262025:  No changes at this time. Update 08/26/2024:  No changes at this time. Update 08/31/2024:  No changes at this time. Update:09/05/2024 no changes  Update 09/10/2024:  No changes at this time. Update 09/15/2024: No changes at this time. Update 09/21/2024: No changes at this time.   Update 09/27/2024: No changes at this time     Patient Goals:  detox, elimination of symptoms of psychosis, medication management for mood stabilization; elimination of SI thoughts; development of comprehensive mental wellness/sobriety plan.  Update 07/26/24: No changes at this time Update 08/01/24: No changes at this time Update 08/06/24: No changes at this time  10/13/23 Update: No changes at this time. Update 08/16/24: No changes at this time.  Update 12/262025:  No changes at this time. Update 08/26/2024:  No changes at this time. Update 08/31/2024:  No changes at this time. 09/05/2024: no changes Update 09/10/2024:  No changes at this time.  Update 09/15/2024: No changes at this time. Update 09/21/2024: No changes at this time.  Update 09/27/2024: No changes at this time     Discharge Plan or Barriers: CSW to assist with the development of appropriate discharge plan.  Update 07/26/24: No changes at this time Update 08/01/24: APS Report made for pt, screened it. Update 08/06/24: FL2 sent to [facility]. Pt is not placement. [facility] and APS to assist with placement. 08/11/24  Update: CSW to continue to support [facility] in working toward placement for patient. Update 08/16/24: No changes at this time. Update 12/262025:  No changes at this time. Update 08/26/2024:  No changes at this time. Update 08/31/2024:  Pt awarded interim guardianship at this time, guardianship court date scheduled for 09/07/2024.  [facility] report no updates on placement at this time. 09/05/2024 no changes  Update 09/10/2024:  No changes at this time.  Update 09/15/2024: Placement continues to be a barrier for this patient. Update 09/21/2024: Pt accepted to [facility], CSW awaiting facility to inform CSW when pt can transition  Update 09/27/2024: No changes at this time     Reason for Continuation of Hospitalization: Aggression Anxiety Delusions  Depression Hallucinations   Estimated Length of Stay:TBD  Update 09/10/2024:  TBD Update 09/15/2024: No changes at this time. Update 09/21/2024:TBD  Update 09/27/2024: No changes at this time  Last 3 [place] Suicide Severity Risk Score: Flowsheet Row Admission (Current) from 07/18/2024 in [facility] from 07/17/2024 [facility] ED to Hosp-Admission (Discharged) from 02/20/2024 in [facility]U  C-SSRS RISK CATEGORY No Risk No Risk No Risk    Last PHQ 2/9 Scores:     No data to display          Scribe for Treatment Team: Roselyn GORMAN Lento, LCSW 09/27/2024 4:03 PM

## 2024-09-27 NOTE — Plan of Care (Signed)
" °  Problem: Activity: Goal: Interest or engagement in activities will improve Outcome: Progressing   Problem: Coping: Goal: Ability to verbalize frustrations and anger appropriately will improve Outcome: Progressing   Problem: Education: Goal: Knowledge of [facility] Education information/materials will improve Outcome: Progressing Goal: Emotional status will improve Outcome: Progressing Goal: Mental status will improve Outcome: Progressing   "

## 2024-09-27 NOTE — Group Note (Signed)
 Date:  09/27/2024 Time:  10:36 AM  Group Topic/Focus:  Goals Group:   The focus of this group is to help patients establish daily goals to achieve during treatment and discuss how the patient can incorporate goal setting into their daily lives to aide in recovery.    Participation Level:  Minimal  Participation Quality:  Attentive  Affect:  Appropriate  Cognitive:  Disorganized and Confused  Insight: Good  Engagement in Group:  Limited  Modes of Intervention:  Discussion  Additional Comments:     Maglione,Annesha Delgreco E 09/27/2024, 10:36 AM

## 2024-09-27 NOTE — Progress Notes (Addendum)
 Pt denies SI/HI/AVH. He denied pain this shift. He is seen coming back and forth from his bedroom to hallway to ask for the time. Metoprolol  12.5 mg held due to diastolic BP being low. Fluids encouraged. Q 15 min safety checks in place.   09/26/24 2111  Psych Admission Type (Psych Patients Only)  Admission Status Involuntary  Psychosocial Assessment  Patient Complaints Disorientation  Eye Contact Fair  Facial Expression Animated  Affect Appropriate to circumstance  Speech Soft  Interaction Assertive  Motor Activity Slow  Appearance/Hygiene Layered clothes  Behavior Characteristics Calm  Mood Pleasant  Thought Process  Coherency WDL  Content WDL  Delusions None reported or observed  Perception WDL  Hallucination None reported or observed  Judgment Poor  Confusion Mild  Danger to Self  Current suicidal ideation? Denies  Danger to Others  Danger to Others None reported or observed

## 2024-09-27 NOTE — Progress Notes (Addendum)
 Behavior:   Confused.  Pleasant and cooperative.   Psych assessment:  Denies SI/HI and AVH.  Group attendance:  0/1  Medication/ PRNs: Cardiac meds held due to low BP. Dr. [name] aware. Compliant with all other medications.  Pain: Denies.  15 min checks in place for safety.    Wound cleaned and dressing changed per wound care orders.

## 2024-09-27 NOTE — Plan of Care (Signed)
  Problem: Activity: Goal: Interest or engagement in activities will improve Outcome: Progressing   Problem: Coping: Goal: Ability to demonstrate self-control will improve Outcome: Progressing   Problem: Health Behavior/Discharge Planning: Goal: Compliance with treatment plan for underlying cause of condition will improve Outcome: Progressing   

## 2024-09-28 NOTE — Group Note (Signed)
 Recreation Therapy Group Note   Group Topic:General Recreation  Group Date: 09/28/2024 Start Time: 1405 End Time: 1455 Facilitators: Celestia Jeoffrey BRAVO, LRT, CTRS Location: Dayroom  Group Description: Bingo. Patients played multiple rounds of bingo. LRT and pts discussed the definition of leisure, things they do in their free time outside of the hospital, and how bingo is also a leisure activity. Pts received a coloring book, word search book, or journal as a prize.    Goal Area(s) Addressed:  Patient will identify a current leisure interest.  Patient will learn the definition of leisure. Patient will have the opportunity to try a new leisure activity. Patient will communicate with peers and LRT.   Affect/Mood: N/A   Participation Level: Did not attend    Clinical Observations/Individualized Feedback: Patient did not attend.  Plan: Continue to engage patient in RT group sessions 2-3x/week.   Jeoffrey BRAVO Celestia, LRT, CTRS 09/28/2024 4:54 PM

## 2024-09-28 NOTE — Plan of Care (Signed)
" °  Problem: Education: Goal: Knowledge of [facility] Education information/materials will improve Outcome: Progressing   Problem: Activity: Goal: Interest or engagement in activities will improve Outcome: Progressing   Problem: Coping: Goal: Ability to verbalize frustrations and anger appropriately will improve Outcome: Progressing   Problem: Education: Goal: Knowledge of [facility] Education information/materials will improve Outcome: Progressing Goal: Emotional status will improve Outcome: Progressing   "

## 2024-09-28 NOTE — Group Note (Signed)
 Date:  09/28/2024 Time:  9:12 PM  Group Topic/Focus:  Wrap-Up Group:   The focus of this group is to help patients review their daily goal of treatment and discuss progress on daily workbooks.    Participation Level:  Active  Participation Quality:  Appropriate  Affect:  Appropriate  Cognitive:  Alert  Insight: Appropriate  Engagement in Group:  Engaged  Modes of Intervention:  Discussion  Additional Comments:    Miguel [name] 09/28/2024, 9:12 PM

## 2024-09-28 NOTE — Group Note (Signed)
 Physical/Occupational Therapy Group Note  Group Topic: Functional, Dynamic Balance   Group Date: 09/28/2024 Start Time: 1300 End Time: 1330 Facilitators: Marten Iles, Alm Hamilton, PT   Group Description: Group discussed impact of balance on safety and independence with functional tasks.  Identified and discussed any self-perceived balance deficits to personalize information.  Discussed and reviewed strategies to address/improve balance deficits: use of assist devices, activity pacing/energy conservation, environment/home safety modifications, focusing attention/minimizing distraction.  Reviewed and participated with standing LE therex designed to target dynamic balance reactions and LE strength/stability; provided handouts with HEP to be utilized outside of group time as appropriate.  Allowed time for questions and further discussion on any balance or mobility concerns/needs.  Therapeutic Goal(s):  Identify and discuss any individual balance deficits and functional implications. Identify and discuss any environmental/home safety modifications that can optimize balance and safety for mobility within the home. Demonstrate understanding and performance of standing therex designed to target dynamic balance deficits.  Individual Participation: Did not attend  Participation Level:   Participation Quality:   Behavior:   Speech/Thought Process:   Affect/Mood:   Insight:   Judgement:   Modes of Intervention:   Patient Response to Interventions:    Plan: Continue to engage patient in PT/OT groups 1 - 2x/week.  CHARM Hamilton Bertin PT, DPT 09/28/24, 1:47 PM

## 2024-09-28 NOTE — Group Note (Signed)
 Date:  09/28/2024 Time:  2:19 PM  Group Topic/Focus:  Coping With Mental Health Crisis:   The purpose of this group is to help patients identify strategies for coping with mental health crisis.  Group discusses possible causes of crisis and ways to manage them effectively. Managing Feelings:   The focus of this group is to identify what feelings patients have difficulty handling and develop a plan to handle them in a healthier way upon discharge.    Participation Level:  Did Not Attend  Participation Quality:    Affect:    Cognitive:    Insight:   Engagement in Group:    Modes of Intervention:    Additional Comments:    [name] L Loxley Schmale 09/28/2024, 2:19 PM

## 2024-09-28 NOTE — Plan of Care (Signed)
  Problem: Activity: Goal: Interest or engagement in activities will improve Outcome: Progressing   Problem: Coping: Goal: Ability to verbalize frustrations and anger appropriately will improve Outcome: Progressing   Problem: Education: Goal: Knowledge of [facility] Education information/materials will improve Outcome: Progressing

## 2024-09-28 NOTE — Progress Notes (Signed)
" °   09/28/24 1100  Psych Admission Type (Psych Patients Only)  Admission Status Voluntary  Psychosocial Assessment  Patient Complaints Disorientation  Eye Contact Fair  Facial Expression Animated  Affect Appropriate to circumstance  Speech Soft  Interaction Assertive  Motor Activity Slow  Appearance/Hygiene Layered clothes  Behavior Characteristics Cooperative  Mood Pleasant  Thought Process  Coherency Disorganized  Content WDL  Delusions None reported or observed  Perception WDL  Hallucination None reported or observed  Judgment Poor  Confusion Mild  Danger to Self  Current suicidal ideation? Denies (Denies)  Danger to Others  Danger to Others None reported or observed    "

## 2024-09-28 NOTE — Progress Notes (Signed)
 Pt is A&Ox2; Disoriented to time and situation. He denies SI/HI/AVH and physical complaints this shift. He slept better this shift with PRN Trazodone  50 mg and Atarax  25 mg. Q 15 min safety checks in place.   09/27/24 2114  Psych Admission Type (Psych Patients Only)  Admission Status Involuntary  Psychosocial Assessment  Patient Complaints Disorientation  Eye Contact Fair  Facial Expression Flat  Affect Appropriate to circumstance  Speech Soft  Interaction Assertive  Motor Activity Slow  Appearance/Hygiene Layered clothes  Behavior Characteristics Cooperative  Mood Pleasant  Thought Process  Coherency WDL  Content WDL  Delusions None reported or observed  Perception WDL  Hallucination None reported or observed  Judgment Poor  Confusion Mild  Danger to Self  Current suicidal ideation? Denies  Danger to Others  Danger to Others None reported or observed

## 2024-09-29 NOTE — Plan of Care (Signed)
   Problem: Education: Goal: Emotional status will improve Outcome: Progressing Goal: Mental status will improve Outcome: Progressing Goal: Verbalization of understanding the information provided will improve Outcome: Progressing

## 2024-09-29 NOTE — [facility] Counselor (Signed)
 CSW contacted [facility] [phone]) to check on status of bed.   CSW unable to reach, left HIPAA compliant VM requesting return call.   Lum Croft, MSW, [place] 09/29/2024 11:07 AM

## 2024-09-29 NOTE — Group Note (Signed)
 Date:  09/29/2024 Time:  4:34 PM  Group Topic/Focus:  Developing a Wellness Toolbox:   The focus of this group is to help patients develop a wellness toolbox with skills and strategies to promote recovery upon discharge.    Participation Level:  Minimal  Participation Quality:  Appropriate  Affect:    Cognitive:  Appropriate  Insight: None  Engagement in Group:  None  Modes of Intervention:    Additional Comments:  Client was asleep.  Magnus Crescenzo 09/29/2024, 4:34 PM

## 2024-09-29 NOTE — Group Note (Signed)
 Recreation Therapy Group Note   Group Topic:Self-Esteem  Group Date: 09/29/2024 Start Time: 1400 End Time: 1440 Facilitators: Celestia Jeoffrey BRAVO, LRT, CTRS Location: Dayroom  Group Description: Positive Affirmation Bingo. LRT and patients played multiple games of Bingo with music playing in the background. LRT and pts discussed what a positive affirmation is, the importance of speaking kindly to yourself, and the use of this as a coping skill.  Goal Area(s) Addressed: Patient will learn positive affirmations.  Patient will engage in recreation activity.  Patient will increase communication.    Affect/Mood: N/A   Participation Level: Did not attend    Clinical Observations/Individualized Feedback: Patient did not attend.  Plan: Continue to engage patient in RT group sessions 2-3x/week.   Jeoffrey BRAVO Celestia, LRT, CTRS 09/29/2024 3:47 PM

## 2024-09-29 NOTE — Progress Notes (Signed)
" °   09/28/24 2353  Psych Admission Type (Psych Patients Only)  Admission Status Voluntary  Psychosocial Assessment  Patient Complaints Disorientation  Eye Contact Fair  Facial Expression Animated  Affect Appropriate to circumstance  Speech Soft  Interaction Assertive  Motor Activity Slow  Appearance/Hygiene Layered clothes  Behavior Characteristics Cooperative  Mood Pleasant  Thought Process  Coherency Disorganized  Content WDL  Delusions None reported or observed  Perception WDL  Hallucination None reported or observed  Judgment Poor  Confusion Mild  Danger to Self  Current suicidal ideation? Denies  Danger to Others  Danger to Others None reported or observed    Estimated Sleeping Duration (Last 24 Hours): 4.25-6.00 hours    "

## 2024-09-29 NOTE — Plan of Care (Signed)
" °  Problem: Education: Goal: Knowledge of [place] [facility] Education information/materials will improve Outcome: Not Progressing   Problem: Activity: Goal: Interest or engagement in activities will improve Outcome: Not Progressing   Problem: Coping: Goal: Ability to verbalize frustrations and anger appropriately will improve Outcome: Not Progressing   Problem: Education: Goal: Knowledge of [place] [facility] Education information/materials will improve Outcome: Not Progressing Goal: Emotional status will improve Outcome: Not Progressing Goal: Mental status will improve Outcome: Not Progressing   "

## 2024-09-29 NOTE — Group Note (Signed)
 Date:  09/29/2024 Time:  9:25 PM  Group Topic/Focus:  Wrap-Up Group:   The focus of this group is to help patients review their daily goal of treatment and discuss progress on daily workbooks.    Participation Level:  Active  Participation Quality:  Appropriate  Affect:  Appropriate  Cognitive:  Alert  Insight: Appropriate  Engagement in Group:  Engaged  Modes of Intervention:  Discussion  Additional Comments:    Miguel [name] Bunker 09/29/2024, 9:25 PM

## 2024-09-30 NOTE — Group Note (Signed)
 [facility] LCSW Group Therapy Note   Group Date: 09/29/2024 Start Time: 1330 End Time: 1345  Type of Therapy/Topic:  Group Therapy:  Feelings about Diagnosis  Participation Level:  Did Not Attend   Mood: X   Description of Group:    This group will allow patients to explore their thoughts and feelings about diagnoses they have received. Patients will be guided to explore their level of understanding and acceptance of these diagnoses. Facilitator will encourage patients to process their thoughts and feelings about the reactions of others to their diagnosis, and will guide patients in identifying ways to discuss their diagnosis with significant others in their lives. This group will be process-oriented, with patients participating in exploration of their own experiences as well as giving and receiving support and challenge from other group members.   Therapeutic Goals: 1. Patient will demonstrate understanding of diagnosis as evidence by identifying two or more symptoms of the disorder:  2. Patient will be able to express two feelings regarding the diagnosis 3. Patient will demonstrate ability to communicate their needs through discussion and/or role plays  Summary of Patient Progress:    X    Therapeutic Modalities:   Cognitive Behavioral Therapy Brief Therapy Feelings Identification    [name], LCSWA

## 2024-09-30 NOTE — Progress Notes (Signed)
" °   09/30/24 1000  Psych Admission Type (Psych Patients Only)  Admission Status Involuntary  Psychosocial Assessment  Patient Complaints Disorientation  Eye Contact Fair  Facial Expression Animated  Affect Preoccupied  Speech Soft  Interaction Assertive  Motor Activity Slow  Appearance/Hygiene Layered clothes  Behavior Characteristics Restless  Mood Preoccupied  Thought Process  Coherency Disorganized  Content WDL  Delusions None reported or observed  Perception WDL  Hallucination None reported or observed  Judgment Poor  Confusion Moderate  Danger to Self  Current suicidal ideation? Denies  Danger to Others  Danger to Others None reported or observed    "

## 2024-09-30 NOTE — Group Note (Signed)
 Date:  09/30/2024 Time:  9:45 AM  Group Topic/Focus:  Movement Therapy, Morning stretch with Denis Koppel.    Participation Level:  Did Not Attend    Miguel [name] Bias 09/30/2024, 9:45 AM

## 2024-09-30 NOTE — Plan of Care (Signed)
" °  Problem: Education: Goal: Knowledge of [facility] [facility] Education information/materials will improve 09/30/2024 2008 by Geneva Steward CROME, RN Outcome: Progressing 09/30/2024 2008 by Geneva Steward CROME, RN Outcome: Progressing   Problem: Activity: Goal: Interest or engagement in activities will improve 09/30/2024 2008 by Geneva Steward CROME, RN Outcome: Progressing 09/30/2024 2008 by Geneva Steward CROME, RN Outcome: Progressing   Problem: Coping: Goal: Ability to verbalize frustrations and anger appropriately will improve 09/30/2024 2008 by Geneva Steward CROME, RN Outcome: Progressing 09/30/2024 2008 by Geneva Steward CROME, RN Outcome: Progressing   Problem: Education: Goal: Knowledge of  General Education information/materials will improve 09/30/2024 2008 by Geneva Steward CROME, RN Outcome: Progressing 09/30/2024 2008 by Geneva Steward CROME, RN Outcome: Progressing Goal: Emotional status will improve 09/30/2024 2008 by Geneva Steward CROME, RN Outcome: Progressing 09/30/2024 2008 by Geneva Steward CROME, RN Outcome: Progressing Goal: Mental status will improve 09/30/2024 2008 by Geneva Steward CROME, RN Outcome: Progressing 09/30/2024 2008 by Geneva Steward CROME, RN Outcome: Progressing Goal: Verbalization of understanding the information provided will improve 09/30/2024 2008 by Geneva Steward CROME, RN Outcome: Progressing 09/30/2024 2008 by Geneva Steward CROME, RN Outcome: Progressing   Problem: Activity: Goal: Interest or engagement in activities will improve 09/30/2024 2008 by Geneva Steward CROME, RN Outcome: Progressing 09/30/2024 2008 by Geneva Steward CROME, RN Outcome: Progressing Goal: Sleeping patterns will improve 09/30/2024 2008 by Geneva Steward CROME, RN Outcome: Progressing 09/30/2024 2008 by Geneva Steward CROME, RN Outcome: Progressing   Problem: Coping: Goal: Ability to verbalize frustrations and anger appropriately will improve 09/30/2024 2008 by Geneva Steward CROME, RN Outcome: Progressing 09/30/2024 2008 by Geneva Steward CROME, RN Outcome:  Progressing Goal: Ability to demonstrate self-control will improve 09/30/2024 2008 by Geneva Steward CROME, RN Outcome: Progressing 09/30/2024 2008 by Geneva Steward CROME, RN Outcome: Progressing   Problem: Health Behavior/Discharge Planning: Goal: Identification of resources available to assist in meeting health care needs will improve 09/30/2024 2008 by Geneva Steward CROME, RN Outcome: Progressing 09/30/2024 2008 by Geneva Steward CROME, RN Outcome: Progressing Goal: Compliance with treatment plan for underlying cause of condition will improve 09/30/2024 2008 by Geneva Steward CROME, RN Outcome: Progressing 09/30/2024 2008 by Geneva Steward CROME, RN Outcome: Progressing   Problem: Physical Regulation: Goal: Ability to maintain clinical measurements within normal limits will improve 09/30/2024 2008 by Geneva Steward CROME, RN Outcome: Progressing 09/30/2024 2008 by Geneva Steward CROME, RN Outcome: Progressing   Problem: Safety: Goal: Periods of time without injury will increase 09/30/2024 2008 by Geneva Steward CROME, RN Outcome: Progressing 09/30/2024 2008 by Geneva Steward CROME, RN Outcome: Progressing   Problem: Coping: Goal: Ability to adjust to condition or change in health will improve 09/30/2024 2008 by Geneva Steward CROME, RN Outcome: Progressing 09/30/2024 2008 by Geneva Steward CROME, RN Outcome: Progressing   Problem: Health Behavior/Discharge Planning: Goal: Ability to identify and utilize available resources and services will improve 09/30/2024 2008 by Geneva Steward CROME, RN Outcome: Progressing 09/30/2024 2008 by Geneva Steward CROME, RN Outcome: Progressing   "

## 2024-09-30 NOTE — Progress Notes (Signed)
 [facility] MD Progress Note  09/30/2024 12:41 PM Miguel [name]  MRN:  [id number]  Miguel [name]. Miguel [name] is a 74 year old male with a past psychiatric history significant for schizophrenia who presents under involuntary commitment initiated by family due to medication non-adherence, increasing paranoia, and behavioral disorganization. 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 Brother reports multiple episodes of patient whispering through the night, stating people were talking about him and planning to harm him. Patient was advised by his ACT Team to come to the ED; he refused, prompting the family to pursue IVC.Patient is admitted to [facility] unit with Q15 min safety monitoring. Multidisciplinary team approach is offered. Medication management; group/milieu therapy is offered.   Subjective:  Chart reviewed, case discussed in multidisciplinary meeting, patient seen during rounds.  Patient is noted to be walking in the hallway.  She is visibly looks drowsy.  He denies having any sleep issues.  Per nursing he has been taking more of his narcotic pain medications due to his sacral wound not healing.  He is unable to verbalize his needs and continues to remain disorganized and talks about random things including somebody going into his room stealing his things or making complaints that he is not being fed enough even within few minutes of his lunch or dinner.  He is not endorsing SI/HI/plan.  Past Psychiatric History: see h&P Family History:  Family History  Family history unknown: Yes   Social  History:  Social History   Substance and Sexual Activity  Alcohol Use No     Social History   Substance and Sexual Activity  Drug Use No    Social History   Socioeconomic History   Marital status: Divorced    Spouse name: Not on file   Number of children: Not on file   Years of education: Not on file   Highest education level: Not on file  Occupational History   Not on file  Tobacco Use   Smoking status: Every Day    Current packs/day: 0.50    Average packs/day: 0.5 packs/day for 46.1 years (23.0 ttl pk-yrs)    Types: Cigarettes    Start date: 64   Smokeless tobacco: Never  Vaping Use   Vaping status: Never Used  Substance and Sexual Activity   Alcohol use: No   Drug use: No   Sexual activity: Not on file  Other Topics Concern   Not on file  Social History Narrative   Not on file   Social Drivers of Health   Tobacco Use: High Risk (07/18/2024)   Patient History    Smoking Tobacco Use: Every Day    Smokeless Tobacco Use: Never    Passive Exposure: Not on file  Financial Resource Strain: Not on file  Food Insecurity: Patient Unable To Answer (07/18/2024)   Epic    Worried About Programme Researcher, Broadcasting/film/video in the Last Year: Patient unable to answer    Ran Out of Food in the Last Year: Patient unable to answer  Transportation Needs: Unmet Transportation Needs (07/18/2024)   Epic    Lack of Transportation (Medical): Yes    Lack of Transportation (Non-Medical): Yes  Physical Activity: Not on file  Stress:  Not on file  Social Connections: Patient Unable To Answer (07/18/2024)   Social Connection and Isolation Panel    Frequency of Communication with Friends and Family: Patient unable to answer    Frequency of Social Gatherings with Friends and Family: Patient unable to answer    Attends Religious Services: Patient unable to answer    Active Member of Clubs or Organizations: Patient unable to answer    Attends Banker Meetings: Patient unable to answer     Marital Status: Patient unable to answer  Depression (PHQ2-9): Not on file  Alcohol Screen: Low Risk (07/18/2024)   Alcohol Screen    Last Alcohol Screening Score (AUDIT): 0  Housing: Unknown (07/18/2024)   Epic    Unable to Pay for Housing in the Last Year: Patient unable to answer    Number of Times Moved in the Last Year: 0    Homeless in the Last Year: Patient declined  Utilities: Patient Unable To Answer (07/18/2024)   Epic    Threatened with loss of utilities: Patient unable to answer  Health Literacy: Not on file   Past Medical History:  Past Medical History:  Diagnosis Date   Biventricular failure (HCC)    CVA (cerebral vascular accident) (HCC)    Polysubstance use disorder    Schizophrenia (HCC)     Past Surgical History:  Procedure Laterality Date   APPENDECTOMY     TRACHEOSTOMY TUBE PLACEMENT N/A 03/04/2024   Procedure: CREATION, TRACHEOSTOMY;  Surgeon: [name] Massie RAMAN, MD;  Location: [facility] ORS;  Service: ENT;  Laterality: N/A;    Current Medications: Current Facility-Administered Medications  Medication Dose Route Frequency Provider Last Rate Last Admin   acetaminophen  (TYLENOL ) tablet 650 mg  650 mg Oral Q6H PRN Hampton, Tracie B, NP   650 mg at 09/17/24 2221   alum & mag hydroxide-simeth (MAALOX/MYLANTA) 200-200-20 MG/5ML suspension 30 mL  30 mL Oral Q4H PRN Hampton, Tracie B, NP   30 mL at 08/05/24 0306   amiodarone  (PACERONE ) tablet 400 mg  400 mg Oral Daily [name], [name], MD   400 mg at 09/30/24 9073   ARIPiprazole  ER (ABILIFY  MAINTENA) injection 400 mg  400 mg Intramuscular Q28 days Kamorah Nevils, MD   400 mg at 09/21/24 0919   benzocaine  (ORAJEL) 10 % mucosal gel   Mouth/Throat TID PRN [name], [name], MD       divalproex  (DEPAKOTE  ER) 24 hr tablet 1,000 mg  1,000 mg Oral QHS Neli Fofana, MD   1,000 mg at 09/29/24 2145   docusate sodium  (COLACE) capsule 100 mg  100 mg Oral Daily Witten Certain, MD   100 mg at 09/30/24 9076   feeding supplement (ENSURE  PLUS HIGH PROTEIN) liquid 237 mL  237 mL Oral TID BM [name], [name], MD   237 mL at 09/30/24 0930   hydrOXYzine  (ATARAX ) tablet 25 mg  25 mg Oral TID PRN Hampton, Tracie B, NP   25 mg at 09/29/24 2145   magnesium  hydroxide (MILK OF MAGNESIA) suspension 30 mL  30 mL Oral Daily PRN Hampton, Tracie B, NP   30 mL at 08/08/24 2233   memantine  (NAMENDA ) tablet 10 mg  10 mg Oral Daily Shailene Demonbreun, MD   10 mg at 09/30/24 9074   metoprolol  tartrate (LOPRESSOR ) tablet 12.5 mg  12.5 mg Oral BID [name], [name], MD   12.5 mg at 09/30/24 9074   naproxen  (NAPROSYN ) tablet 250 mg  250 mg Oral BID WC Maryah Marinaro, MD  250 mg at 09/30/24 9076   OLANZapine  (ZYPREXA ) injection 5 mg  5 mg Intramuscular TID PRN Hampton, Tracie B, NP       OLANZapine  zydis (ZYPREXA ) disintegrating tablet 5 mg  5 mg Oral TID PRN Hampton, Tracie B, NP   5 mg at 08/04/24 1129   oxyCODONE  (Oxy IR/ROXICODONE ) immediate release tablet 5 mg  5 mg Oral Q4H PRN Dorinda Homans T, MD   5 mg at 09/29/24 1110   pantoprazole  (PROTONIX ) EC tablet 40 mg  40 mg Oral Daily [name], [name], MD   40 mg at 09/30/24 9074   sodium chloride  (OCEAN) 0.65 % nasal spray 1 spray  1 spray Each Nare PRN Donnelly Mellow, MD   1 spray at 08/18/24 1459   traZODone  (DESYREL ) tablet 50 mg  50 mg Oral QHS PRN Hampton, Tracie B, NP   50 mg at 09/29/24 2145    Lab Results:  No results found for this or any previous visit (from the past 48 hours).     Blood Alcohol level:  Lab Results  Component Value Date   [facility] <15 07/17/2024   ETH <15 02/20/2024    Metabolic Disorder Labs: Lab Results  Component Value Date   HGBA1C 5.4 08/12/2024   MPG 108.28 08/12/2024   MPG 119.76 07/14/2018   No results found for: PROLACTIN Lab Results  Component Value Date   CHOL 139 08/12/2024   TRIG 73 08/12/2024   HDL 52 08/12/2024   CHOLHDL 2.7 08/12/2024   VLDL 15 08/12/2024   LDLCALC 72 08/12/2024   LDLCALC 119 (H) 07/13/2018    Physical  Findings: AIMS:  , ,  ,  ,    CIWA:    COWS:      Psychiatric Specialty Exam:  Presentation  General Appearance: Appropriate for Environment  Eye Contact:Fleeting  Speech:Normal Rate  Speech Volume:Decreased    Mood and Affect  Mood:Anxious  Affect:Flat   Thought Process  Thought Processes:circumstantial Orientation:Partial  Thought Content:discharge focused  Hallucinations:denies  Ideas of Reference:Delusions At baseline Suicidal Thoughts:denies  Homicidal Thoughts:denies   Sensorium  Memory:Immediate Poor; Recent Poor; Remote Poor  Judgment:Impaired  Insight:None   Executive Functions  Concentration:Poor  Attention Span:Poor  Recall:Poor  Fund of Knowledge:Poor  Language:Fair   Psychomotor Activity  Psychomotor Activity:No data recorded  Musculoskeletal: Strength & Muscle Tone: within normal limits Gait & Station: normal Assets  Assets:Communication Skills; Desire for Improvement; Social Support    Physical Exam: Physical Exam Vitals and nursing note reviewed.    ROS Blood pressure 128/65, pulse 66, temperature (!) 97.3 F (36.3 C), resp. rate 17, height 6' 1 (1.854 m), weight 81.2 kg, SpO2 94%. Body mass index is 23.62 kg/m.  Diagnosis: Active Problems:   Schizophrenia, paranoid (HCC)   Dementia with behavioral disturbance (HCC)   PLAN: Safety and Monitoring:  -- Involuntary admission to inpatient psychiatric unit for safety, stabilization and treatment  -- Daily contact with patient to assess and evaluate symptoms and progress in treatment  -- Patient's case to be discussed in multi-disciplinary team meeting  -- Observation Level : q15 minute checks  -- Vital signs:  q12 hours  -- Precautions: suicide, elopement, and assault -- Encouraged patient to participate in unit milieu and in scheduled group therapies  2. Psychiatric Treatment:  Scheduled Medications:  Discontinued oral Abilify  LAI Abilify  Maintena 400 mg q4  weeks given on 07/27/24, 08/24/2024  Cogentin  1mg  one dose given for EPS Depakote  ER increased to 1000 mg to help with  mood stabilization  Depakote  levels 09/02/24: 57  -- The risks/benefits/side-effects/alternatives to this medication were discussed in detail with the patient and time was given for questions. The patient consents to medication trial.  3. Medical Issues Being Addressed:  [facility] consulted for wound care management   Assessment and Plan:   Sacral Decubitus Ulcer Full thickness decubitus ulcer in setting of recent extended hospitalization, discharged from [facility] 14th Wound looks pretty good overall WOC on board we appreciate input   Cognitive Deficit Concern for anoxic brain injury at discharge 02/2024 Delirium precautions   Peripheral Vascular Disease Legs with features c/w peripheral vascular disease, diminished DP pulses Would benefit from outpatient ABI's   HFrEF Euvolemic at this time Continue home medications   Hx Atrial Flutter Not on any anticoagulation at this time EKG with NSR Continue amiodarone   Hypertension Noted, adjust meds gradually after med rec complete   Tooth pain/Infection - Added: Augmentin  along with Orajel BP -better today no longer hypotensive-Will continue reduced dose of metoprolol  12.5 bid.   4. Discharge Planning:   -- Social work and case management to assist with discharge planning and identification of hospital follow-up needs prior to discharge  -- Estimated LOS: 3-4 days  Alexei Doswell, MD 09/30/2024, 12:41 PM

## 2024-09-30 NOTE — Progress Notes (Addendum)
 Pt disoriented to time and situation. He denies pain this shift. He does not appear to be responding to internal stimuli. Metoprolol  12.5 mg held d/t low BP (96/52 P 68). He is safe on the unit at this time with Q 15 min safety checks in place.   09/29/24 2145  Psych Admission Type (Psych Patients Only)  Admission Status Involuntary  Psychosocial Assessment  Patient Complaints Disorientation  Eye Contact Staring  Facial Expression Pensive  Affect Preoccupied  Speech Word salad  Interaction Assertive  Motor Activity Slow  Appearance/Hygiene Layered clothes  Behavior Characteristics Restless  Mood Preoccupied  Thought Process  Coherency Disorganized  Content WDL  Delusions None reported or observed  Perception WDL  Hallucination None reported or observed  Judgment Poor  Confusion Moderate  Danger to Self  Current suicidal ideation? Denies  Danger to Others  Danger to Others None reported or observed

## 2024-09-30 NOTE — Progress Notes (Signed)
 Wound care completed with measurements. No tunneling noted today but there is a new reddened area to the left outer aspect of the wound. Patient denies pain with palpation. Will put in a wound care consult.

## 2024-09-30 NOTE — Progress Notes (Signed)
" °   09/30/24 2300  Psych Admission Type (Psych Patients Only)  Admission Status Involuntary  Psychosocial Assessment  Patient Complaints Disorientation  Eye Contact Fair  Facial Expression Animated  Affect Preoccupied  Speech Soft  Interaction Assertive  Motor Activity Slow  Appearance/Hygiene Unremarkable  Behavior Characteristics Restless  Mood Preoccupied  Thought Process  Coherency Disorganized  Content WDL  Delusions None reported or observed  Perception WDL  Hallucination None reported or observed  Judgment Poor  Confusion Moderate  Danger to Self  Current suicidal ideation? Denies  Danger to Others  Danger to Others None reported or observed    "

## 2024-09-30 NOTE — [facility] Counselor (Signed)
 CSW contacted [facility] [phone]) to check on status of bed.   CSW left a message with front desk staff asking for a call back from Tammy.   CSW awaits return call at this time.   Lum Croft, MSW, [place] 09/30/2024 2:24 PM

## 2024-09-30 NOTE — Group Note (Signed)
 Date:  09/30/2024 Time:  9:21 PM  Group Topic/Focus:  Wrap-Up Group:   The focus of this group is to help patients review their daily goal of treatment and discuss progress on daily workbooks.    Participation Level:  Active  Participation Quality:  Appropriate  Affect:  Appropriate  Cognitive:  Alert  Insight: Appropriate  Engagement in Group:  Engaged  Modes of Intervention:  Discussion  Additional Comments:    [name] 09/30/2024, 9:21 PM

## 2024-09-30 NOTE — Plan of Care (Signed)
" °  Problem: Activity: Goal: Interest or engagement in activities will improve Outcome: Progressing   Problem: Coping: Goal: Ability to verbalize frustrations and anger appropriately will improve Outcome: Progressing   Problem: Education: Goal: Knowledge of [facility] Education information/materials will improve Outcome: Progressing Goal: Emotional status will improve Outcome: Progressing   "

## 2024-10-01 NOTE — Progress Notes (Signed)
 [facility] MD Progress Note  10/01/2024 12:20 PM CORRY IHNEN  MRN:  [id number]  Jere Bostrom. Heyde is a 74 year old male with a past psychiatric history significant for schizophrenia who presents under involuntary commitment initiated by family due to medication non-adherence, increasing paranoia, and behavioral disorganization. 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 Brother reports multiple episodes of patient whispering through the night, stating people were talking about him and planning to harm him. Patient was advised by his ACT Team to come to the ED; he refused, prompting the family to pursue IVC.Patient is admitted to [facility] unit with Q15 min safety monitoring. Multidisciplinary team approach is offered. Medication management; group/milieu therapy is offered.   Subjective:  Chart reviewed, case discussed in multidisciplinary meeting, patient seen during rounds.   Patient is noted to be resting in bed.  He offers no complaints.  He is visible on the unit.  He continues to remain confused with large at times forgetting having meals and will be demanding meals again.  He has been maintaining calm behaviors on the unit.  He takes his medications with no report he denies SI/HI/sedations.   Past Psychiatric History: see h&P Family History:  Family History  Family history unknown: Yes   Social History:  Social History   Substance and Sexual Activity  Alcohol Use No     Social History   Substance and Sexual Activity  Drug Use No    Social History   Socioeconomic  History   Marital status: Divorced    Spouse name: Not on file   Number of children: Not on file   Years of education: Not on file   Highest education level: Not on file  Occupational History   Not on file  Tobacco Use   Smoking status: Every Day    Current packs/day: 0.50    Average packs/day: 0.5 packs/day for 46.1 years (23.0 ttl pk-yrs)    Types: Cigarettes    Start date: 35   Smokeless tobacco: Never  Vaping Use   Vaping status: Never Used  Substance and Sexual Activity   Alcohol use: No   Drug use: No   Sexual activity: Not on file  Other Topics Concern   Not on file  Social History Narrative   Not on file   Social Drivers of Health   Tobacco Use: High Risk (07/18/2024)   Patient History    Smoking Tobacco Use: Every Day    Smokeless Tobacco Use: Never    Passive Exposure: Not on file  Financial Resource Strain: Not on file  Food Insecurity: Patient Unable To Answer (07/18/2024)   Epic    Worried About Programme Researcher, Broadcasting/film/video in the Last Year: Patient unable to answer    Ran Out of Food in the Last Year: Patient unable to answer  Transportation Needs: Unmet Transportation Needs (07/18/2024)   Epic    Lack of Transportation (Medical): Yes    Lack of Transportation (Non-Medical): Yes  Physical Activity: Not on file  Stress: Not on file  Social Connections: Patient Unable To Answer (07/18/2024)   Social Connection and Isolation Panel    Frequency of Communication with Friends and Family: Patient unable to answer  Frequency of Social Gatherings with Friends and Family: Patient unable to answer    Attends Religious Services: Patient unable to answer    Active Member of Clubs or Organizations: Patient unable to answer    Attends Club or Organization Meetings: Patient unable to answer    Marital Status: Patient unable to answer  Depression (PHQ2-9): Not on file  Alcohol Screen: Low Risk (07/18/2024)   Alcohol Screen    Last Alcohol Screening Score (AUDIT): 0   Housing: Unknown (07/18/2024)   Epic    Unable to Pay for Housing in the Last Year: Patient unable to answer    Number of Times Moved in the Last Year: 0    Homeless in the Last Year: Patient declined  Utilities: Patient Unable To Answer (07/18/2024)   Epic    Threatened with loss of utilities: Patient unable to answer  Health Literacy: Not on file   Past Medical History:  Past Medical History:  Diagnosis Date   Biventricular failure (HCC)    CVA (cerebral vascular accident) (HCC)    Polysubstance use disorder    Schizophrenia (HCC)     Past Surgical History:  Procedure Laterality Date   APPENDECTOMY     TRACHEOSTOMY TUBE PLACEMENT N/A 03/04/2024   Procedure: CREATION, TRACHEOSTOMY;  Surgeon: [name] Massie RAMAN, MD;  Location: [facility] ORS;  Service: ENT;  Laterality: N/A;    Current Medications: Current Facility-Administered Medications  Medication Dose Route Frequency Provider Last Rate Last Admin   acetaminophen  (TYLENOL ) tablet 650 mg  650 mg Oral Q6H PRN Hampton, Tracie B, NP   650 mg at 09/30/24 2232   alum & mag hydroxide-simeth (MAALOX/MYLANTA) 200-200-20 MG/5ML suspension 30 mL  30 mL Oral Q4H PRN Hampton, Tracie B, NP   30 mL at 08/05/24 0306   amiodarone  (PACERONE ) tablet 400 mg  400 mg Oral Daily [name], [name], MD   400 mg at 10/01/24 0914   ARIPiprazole  ER (ABILIFY  MAINTENA) injection 400 mg  400 mg Intramuscular Q28 days Sael Furches, MD   400 mg at 09/21/24 0919   benzocaine  (ORAJEL) 10 % mucosal gel   Mouth/Throat TID PRN [name], [name], MD       divalproex  (DEPAKOTE  ER) 24 hr tablet 1,000 mg  1,000 mg Oral QHS Ruthia Person, MD   1,000 mg at 09/30/24 2123   docusate sodium  (COLACE) capsule 100 mg  100 mg Oral Daily Mariusz Jubb, MD   100 mg at 10/01/24 0915   feeding supplement (ENSURE PLUS HIGH PROTEIN) liquid 237 mL  237 mL Oral TID BM [name], [name], MD   237 mL at 10/01/24 9078   hydrOXYzine  (ATARAX ) tablet 25 mg  25 mg Oral TID PRN Hampton, Tracie B,  NP   25 mg at 09/30/24 2124   magnesium  hydroxide (MILK OF MAGNESIA) suspension 30 mL  30 mL Oral Daily PRN Hampton, Tracie B, NP   30 mL at 08/08/24 2233   memantine  (NAMENDA ) tablet 10 mg  10 mg Oral Daily Caleigh Rabelo, MD   10 mg at 10/01/24 0913   metoprolol  tartrate (LOPRESSOR ) tablet 12.5 mg  12.5 mg Oral BID [name], [name], MD   12.5 mg at 10/01/24 0914   naproxen  (NAPROSYN ) tablet 250 mg  250 mg Oral BID WC Joyell Emami, MD   250 mg at 10/01/24 0915   OLANZapine  (ZYPREXA ) injection 5 mg  5 mg Intramuscular TID PRN Hampton, Tracie B, NP       OLANZapine  zydis (ZYPREXA ) disintegrating tablet 5  mg  5 mg Oral TID PRN Hampton, Tracie B, NP   5 mg at 08/04/24 1129   oxyCODONE  (Oxy IR/ROXICODONE ) immediate release tablet 5 mg  5 mg Oral Q4H PRN Dorinda Homans T, MD   5 mg at 09/29/24 1110   pantoprazole  (PROTONIX ) EC tablet 40 mg  40 mg Oral Daily [name], [name], MD   40 mg at 10/01/24 0914   sodium chloride  (OCEAN) 0.65 % nasal spray 1 spray  1 spray Each Nare PRN Caleel Kiner, MD   1 spray at 08/18/24 1459   traZODone  (DESYREL ) tablet 50 mg  50 mg Oral QHS PRN Hampton, Tracie B, NP   50 mg at 09/30/24 2124    Lab Results:  No results found for this or any previous visit (from the past 48 hours).     Blood Alcohol level:  Lab Results  Component Value Date   [facility] <15 07/17/2024   ETH <15 02/20/2024    Metabolic Disorder Labs: Lab Results  Component Value Date   HGBA1C 5.4 08/12/2024   MPG 108.28 08/12/2024   MPG 119.76 07/14/2018   No results found for: PROLACTIN Lab Results  Component Value Date   CHOL 139 08/12/2024   TRIG 73 08/12/2024   HDL 52 08/12/2024   CHOLHDL 2.7 08/12/2024   VLDL 15 08/12/2024   LDLCALC 72 08/12/2024   LDLCALC 119 (H) 07/13/2018    Physical Findings: AIMS:  , ,  ,  ,    CIWA:    COWS:      Psychiatric Specialty Exam:  Presentation  General Appearance: Appropriate for Environment  Eye Contact:Fleeting  Speech:Normal  Rate  Speech Volume:Decreased    Mood and Affect  Mood:Anxious  Affect:Flat   Thought Process  Thought Processes:circumstantial Orientation:Partial  Thought Content:discharge focused  Hallucinations:denies  Ideas of Reference:Delusions At baseline Suicidal Thoughts:denies  Homicidal Thoughts:denies   Sensorium  Memory:Immediate Poor; Recent Poor; Remote Poor  Judgment:Impaired  Insight:None   Executive Functions  Concentration:Poor  Attention Span:Poor  Recall:Poor  Fund of Knowledge:Poor  Language:Fair   Psychomotor Activity  Psychomotor Activity:No data recorded  Musculoskeletal: Strength & Muscle Tone: within normal limits Gait & Station: normal Assets  Assets:Communication Skills; Desire for Improvement; Social Support    Physical Exam: Physical Exam Vitals and nursing note reviewed.    ROS Blood pressure (!) 136/59, pulse (!) 59, temperature (!) 97.4 F (36.3 C), resp. rate 20, height 6' 1 (1.854 m), weight 81.2 kg, SpO2 99%. Body mass index is 23.62 kg/m.  Diagnosis: Active Problems:   Schizophrenia, paranoid (HCC)   Dementia with behavioral disturbance (HCC)   PLAN: Safety and Monitoring:  -- Involuntary admission to inpatient psychiatric unit for safety, stabilization and treatment  -- Daily contact with patient to assess and evaluate symptoms and progress in treatment  -- Patient's case to be discussed in multi-disciplinary team meeting  -- Observation Level : q15 minute checks  -- Vital signs:  q12 hours  -- Precautions: suicide, elopement, and assault -- Encouraged patient to participate in unit milieu and in scheduled group therapies  2. Psychiatric Treatment:  Scheduled Medications:  Discontinued oral Abilify  LAI Abilify  Maintena 400 mg q4 weeks given on 09/21/24  Cogentin  1mg  one dose given for EPS Depakote  ER increased to 1000 mg to help with mood stabilization  Depakote  levels 09/02/24: 57  -- The  risks/benefits/side-effects/alternatives to this medication were discussed in detail with the patient and time was given for questions. The patient consents to medication trial.  3. Medical Issues Being Addressed:  [facility] consulted for wound care management   Assessment and Plan:   Sacral Decubitus Ulcer Full thickness decubitus ulcer in setting of recent extended hospitalization, discharged from [facility] Nov 14th Wound looks pretty good overall WOC on board we appreciate input   Cognitive Deficit Concern for anoxic brain injury at discharge 02/2024 Delirium precautions   Peripheral Vascular Disease Legs with features c/w peripheral vascular disease, diminished DP pulses Would benefit from outpatient ABI's   HFrEF Euvolemic at this time Continue home medications   Hx Atrial Flutter Not on any anticoagulation at this time EKG with NSR Continue amiodarone   Hypertension Noted, adjust meds gradually after med rec complete   Tooth pain/Infection - Added: Augmentin  along with Orajel BP -better today no longer hypotensive-Will continue reduced dose of metoprolol  12.5 bid.   4. Discharge Planning:   -- Social work and case management to assist with discharge planning and identification of hospital follow-up needs prior to discharge  -- Estimated LOS: 3-4 days  Ahijah Devery, MD 10/01/2024, 12:20 PM

## 2024-10-01 NOTE — Group Note (Signed)
 LCSW Group Therapy Note  Group Date: 10/01/2024 Start Time: 1315 End Time: 1345   Type of Therapy and Topic:  Group Therapy - Healthy vs Unhealthy Coping Skills  Participation Level:  None   Description of Group The focus of this group was to determine what unhealthy coping techniques typically are used by group members and what healthy coping techniques would be helpful in coping with various problems. Patients were guided in becoming aware of the differences between healthy and unhealthy coping techniques. Patients were asked to identify 2-3 healthy coping skills they would like to learn to use more effectively.  Therapeutic Goals Patients learned that coping is what human beings do all day long to deal with various situations in their lives Patients defined and discussed healthy vs unhealthy coping techniques Patients identified their preferred coping techniques and identified whether these were healthy or unhealthy Patients determined 2-3 healthy coping skills they would like to become more familiar with and use more often. Patients provided support and ideas to each other   Summary of Patient Progress: X   Therapeutic Modalities Cognitive Behavioral Therapy Motivational Interviewing  Miguel [name], [place] 10/01/2024  2:14 PM

## 2024-10-01 NOTE — Group Note (Signed)
 Date:  10/01/2024 Time:  3:32 PM  Group Topic/Focus:  Making Healthy Choices:   The focus of this group is to help patients identify negative/unhealthy choices they were using prior to admission and identify positive/healthier coping strategies to replace them upon discharge.    Participation Level:  Active  Participation Quality:  Drowsy  Affect:  Appropriate  Cognitive:  Appropriate  Insight: Good  Engagement in Group:  Engaged  Modes of Intervention:  Discussion   Arland Nutting 10/01/2024, 3:32 PM

## 2024-10-01 NOTE — Plan of Care (Signed)
  Problem: Education: Goal: Knowledge of [place] [facility] Education information/materials will improve Outcome: Progressing   Problem: Activity: Goal: Interest or engagement in activities will improve Outcome: Progressing   Problem: Coping: Goal: Ability to verbalize frustrations and anger appropriately will improve Outcome: Progressing   Problem: Education: Goal: Knowledge of  General Education information/materials will improve Outcome: Progressing Goal: Emotional status will improve Outcome: Progressing Goal: Mental status will improve Outcome: Progressing Goal: Verbalization of understanding the information provided will improve Outcome: Progressing   Problem: Activity: Goal: Interest or engagement in activities will improve Outcome: Progressing Goal: Sleeping patterns will improve Outcome: Progressing   Problem: Coping: Goal: Ability to verbalize frustrations and anger appropriately will improve Outcome: Progressing Goal: Ability to demonstrate self-control will improve Outcome: Progressing   Problem: Health Behavior/Discharge Planning: Goal: Identification of resources available to assist in meeting health care needs will improve Outcome: Progressing Goal: Compliance with treatment plan for underlying cause of condition will improve Outcome: Progressing   Problem: Physical Regulation: Goal: Ability to maintain clinical measurements within normal limits will improve Outcome: Progressing   Problem: Safety: Goal: Periods of time without injury will increase Outcome: Progressing   Problem: Coping: Goal: Ability to adjust to condition or change in health will improve Outcome: Progressing   Problem: Health Behavior/Discharge Planning: Goal: Ability to identify and utilize available resources and services will improve Outcome: Progressing

## 2024-10-01 NOTE — Progress Notes (Signed)
" °   10/01/24 2100  Psych Admission Type (Psych Patients Only)  Admission Status Involuntary  Psychosocial Assessment  Patient Complaints Disorientation  Eye Contact Fair  Facial Expression Animated  Affect Preoccupied  Speech Soft  Interaction Assertive  Motor Activity Slow  Appearance/Hygiene Unremarkable  Behavior Characteristics Restless  Mood Preoccupied  Thought Process  Coherency Disorganized  Content WDL  Delusions None reported or observed  Perception WDL  Hallucination None reported or observed  Judgment Poor  Confusion Moderate  Danger to Self  Current suicidal ideation? Denies  Danger to Others  Danger to Others None reported or observed    "

## 2024-10-01 NOTE — Group Note (Signed)
 Recreation Therapy Group Note   Group Topic:Animal Assisted Therapy   Group Date: 10/01/2024 Start Time: 1015 End Time: 1040 Facilitators: Celestia Jeoffrey BRAVO, LRT, CTRS Location: Dayroom  Group Description: AAA. Animal-Assisted Activity provides opportunities for motivational, educational, therapeutic and/or recreational benefits to enhance quality of life. Selinda and Rollo visited the unit to interact with patients.   Goal Areas Addressed:  Reduced anxiety and stress Improved mood Increased social interaction Enhanced communication skills Reduced loneliness and isolation Improved emotional regulation   Affect/Mood: Appropriate   Participation Level: Active and Engaged   Participation Quality: Independent   Behavior: Calm and Cooperative   Speech/Thought Process: Loose association   Insight: Fair   Judgement: Fair    Modes of Intervention: Activity   Patient Response to Interventions:  Receptive   Education Outcome:  In group clarification offered    Clinical Observations/Individualized Feedback: Miguel [name] was active in their participation of session activities and group discussion. Pt interacted well with LRT and peers duration of session.    Plan: Continue to engage patient in RT group sessions 2-3x/week.   Jeoffrey BRAVO Celestia, LRT, CTRS 10/01/2024 11:24 AM

## 2024-10-01 NOTE — Group Note (Signed)
 Date:  10/01/2024 Time:  8:38 PM  Group Topic/Focus:  Making Healthy Choices:   The focus of this group is to help patients identify negative/unhealthy choices they were using prior to admission and identify positive/healthier coping strategies to replace them upon discharge.    Participation Level:  Active  Participation Quality:  Appropriate  Affect:  Appropriate  Cognitive:  Appropriate  Insight: Good  Engagement in Group:  Engaged  Modes of Intervention:  Discussion  Additional Comments:    Miguel [name] 10/01/2024, 8:38 PM

## 2024-10-02 MED ORDER — TUBERCULIN PPD 5 UNIT/0.1ML ID SOLN
5.0000 [IU] | Freq: Once | INTRADERMAL | Status: AC
  Administered 2024-10-02: 5 [IU] via INTRADERMAL
  Filled 2024-10-02: qty 0.1

## 2024-10-02 NOTE — Group Note (Signed)
 Date:  10/02/2024 Time:  9:29 PM  Group Topic/Focus:  Overcoming Stress:   The focus of this group is to define stress and help patients assess their triggers.    Participation Level:  Active  Participation Quality:  Appropriate  Affect:  Appropriate  Cognitive:  Appropriate  Insight: Good  Engagement in Group:  Engaged  Modes of Intervention:  Discussion  Additional Comments:    Miguel [name] 10/02/2024, 9:29 PM

## 2024-10-02 NOTE — B[facility] Group Notes (Signed)
 Spirituality Group   Description: Participant directed exploration of values, beliefs and meaning   **Focus on Gratitude: Invite reflection on sources of gratitude (external/internal); goal to invite internal gratitude to foster 1) reconnection with life-giving activities 2) self-compassion.   Following a brief framework of chaplains role and ground rules of group behavior, participants are invited to share concerns or questions that engage spiritual life. Emphasis placed on common themes and shared experiences and ways to make meaning and clarify living into ones values.   Theory/Process/Goal: Utilize the theoretical framework of group therapy established by Celena Kite, Relational Cultural Theory and Rogerian approaches to facilitate relational empathy and use of the here and now to foster reflection, self-awareness, and sharing.   Observations: Actively participated in the group discussion. Named respect as a value.  Miguel [name] HERO.Div

## 2024-10-02 NOTE — BH IP Treatment Plan (Signed)
 Interdisciplinary Treatment and Diagnostic Plan Update  10/02/2024 Time of Session: 9:30 AM  Miguel [name] MRN: [id number]  Principal Diagnosis: <principal problem not specified>  Secondary Diagnoses: Active Problems:   Schizophrenia, paranoid (HCC)   Dementia with behavioral disturbance (HCC)   Current Medications:  Current Facility-Administered Medications  Medication Dose Route Frequency Provider Last Rate Last Admin   acetaminophen  (TYLENOL ) tablet 650 mg  650 mg Oral Q6H PRN Hampton, Tracie B, NP   650 mg at 09/30/24 2232   alum & mag hydroxide-simeth (MAALOX/MYLANTA) 200-200-20 MG/5ML suspension 30 mL  30 mL Oral Q4H PRN Hampton, Tracie B, NP   30 mL at 08/05/24 0306   amiodarone  (PACERONE ) tablet 400 mg  400 mg Oral Daily [name], [name], MD   400 mg at 10/02/24 1013   ARIPiprazole  ER (ABILIFY  MAINTENA) injection 400 mg  400 mg Intramuscular Q28 days [name], [name], MD   400 mg at 09/21/24 9080   benzocaine  (ORAJEL) 10 % mucosal gel   Mouth/Throat TID PRN [name], [name], MD       divalproex  (DEPAKOTE  ER) 24 hr tablet 1,000 mg  1,000 mg Oral QHS [name], [name], MD   1,000 mg at 10/01/24 2114   docusate sodium  (COLACE) capsule 100 mg  100 mg Oral Daily [name], [name], MD   100 mg at 10/02/24 1013   feeding supplement (ENSURE PLUS HIGH PROTEIN) liquid 237 mL  237 mL Oral TID BM [name], [name], MD   237 mL at 10/02/24 1013   hydrOXYzine  (ATARAX ) tablet 25 mg  25 mg Oral TID PRN Hampton, Tracie B, NP   25 mg at 10/01/24 2114   magnesium  hydroxide (MILK OF MAGNESIA) suspension 30 mL  30 mL Oral Daily PRN Hampton, Tracie B, NP   30 mL at 08/08/24 2233   memantine  (NAMENDA ) tablet 10 mg  10 mg Oral Daily [name], [name], MD   10 mg at 10/02/24 1013   metoprolol  tartrate (LOPRESSOR ) tablet 12.5 mg  12.5 mg Oral BID [name], [name], MD   12.5 mg at 10/02/24 1013   naproxen  (NAPROSYN ) tablet 250 mg  250 mg Oral BID WC [name], [name], MD   250 mg at 10/02/24 0744   OLANZapine   (ZYPREXA ) injection 5 mg  5 mg Intramuscular TID PRN Hampton, Tracie B, NP       OLANZapine  zydis (ZYPREXA ) disintegrating tablet 5 mg  5 mg Oral TID PRN Hampton, Tracie B, NP   5 mg at 08/04/24 1129   oxyCODONE  (Oxy IR/ROXICODONE ) immediate release tablet 5 mg  5 mg Oral Q4H PRN Dorinda Drue DASEN, MD   5 mg at 09/29/24 1110   pantoprazole  (PROTONIX ) EC tablet 40 mg  40 mg Oral Daily [name], [name], MD   40 mg at 10/02/24 1013   sodium chloride  (OCEAN) 0.65 % nasal spray 1 spray  1 spray Each Nare PRN Donnelly Mellow, MD   1 spray at 08/18/24 1459   traZODone  (DESYREL ) tablet 50 mg  50 mg Oral QHS PRN Hampton, Tracie B, NP   50 mg at 10/01/24 2114   PTA Medications: Medications Prior to Admission  Medication Sig Dispense Refill Last Dose/Taking   amiodarone  (PACERONE ) 400 MG tablet Place 1 tablet (400 mg total) into feeding tube daily.      ARIPiprazole  (ABILIFY ) 30 MG tablet Place 1 tablet (30 mg total) into feeding tube at bedtime.      bisacodyl  (DULCOLAX) 10 MG suppository Place 1 suppository (10 mg total) rectally daily as  needed for moderate constipation or severe constipation.      busPIRone  (BUSPAR ) 5 MG tablet Take 5 mg by mouth 3 (three) times daily.      Chlorhexidine  Gluconate Cloth 2 % PADS Apply 6 each topically daily.      digoxin  (LANOXIN ) 0.125 MG tablet 1 tablet (0.125 mg total) by Per NG tube route daily. (Patient taking differently: Take 0.125 mg by mouth every other day.)      docusate (COLACE) 50 MG/5ML liquid Place 10 mLs (100 mg total) into feeding tube 2 (two) times daily.      metoprolol  tartrate (LOPRESSOR ) 25 MG tablet Take 25 mg by mouth 2 (two) times daily.      pantoprazole  (PROTONIX ) 40 MG tablet Take 40 mg by mouth daily.      traMADol  (ULTRAM ) 50 MG tablet Take 50 mg by mouth 2 (two) times daily.       Patient Stressors: Educational concerns   Health problems   Medication change or noncompliance   Other: Stated transitional housing concerns    Patient  Strengths: Manufacturing systems engineer  Religious Affiliation   Treatment Modalities: Medication Management, Group therapy, Case management,  1 to 1 session with clinician, Psychoeducation, Recreational therapy.   Physician Treatment Plan for Primary Diagnosis: <principal problem not specified> Long Term Goal(s):     Short Term Goals:    Medication Management: Evaluate patient's response, side effects, and tolerance of medication regimen.  Therapeutic Interventions: 1 to 1 sessions, Unit Group sessions and Medication administration.  Evaluation of Outcomes: Adequate for Discharge  Physician Treatment Plan for Secondary Diagnosis: Active Problems:   Schizophrenia, paranoid (HCC)   Dementia with behavioral disturbance (HCC)  Long Term Goal(s):     Short Term Goals:       Medication Management: Evaluate patient's response, side effects, and tolerance of medication regimen.  Therapeutic Interventions: 1 to 1 sessions, Unit Group sessions and Medication administration.  Evaluation of Outcomes: Adequate for Discharge   RN Treatment Plan for Primary Diagnosis: <principal problem not specified> Long Term Goal(s): Knowledge of disease and therapeutic regimen to maintain health will improve  Short Term Goals: Ability to remain free from injury will improve, Ability to verbalize frustration and anger appropriately will improve, Ability to demonstrate self-control, Ability to participate in decision making will improve, Ability to verbalize feelings will improve, Ability to disclose and discuss suicidal ideas, Ability to identify and develop effective coping behaviors will improve, and Compliance with prescribed medications will improve  Medication Management: RN will administer medications as ordered by provider, will assess and evaluate patient's response and provide education to patient for prescribed medication. RN will report any adverse and/or side effects to prescribing  provider.  Therapeutic Interventions: 1 on 1 counseling sessions, Psychoeducation, Medication administration, Evaluate responses to treatment, Monitor vital signs and CBGs as ordered, Perform/monitor CIWA, COWS, AIMS and Fall Risk screenings as ordered, Perform wound care treatments as ordered.  Evaluation of Outcomes: Adequate for Discharge   LCSW Treatment Plan for Primary Diagnosis: <principal problem not specified> Long Term Goal(s): Safe transition to appropriate next level of care at discharge, Engage patient in therapeutic group addressing interpersonal concerns.  Short Term Goals: Engage patient in aftercare planning with referrals and resources, Increase social support, Increase ability to appropriately verbalize feelings, Increase emotional regulation, Facilitate acceptance of mental health diagnosis and concerns, Facilitate patient progression through stages of change regarding substance use diagnoses and concerns, Identify triggers associated with mental health/substance abuse issues, and Increase skills for wellness  and recovery  Therapeutic Interventions: Assess for all discharge needs, 1 to 1 time with Child psychotherapist, Explore available resources and support systems, Assess for adequacy in community support network, Educate family and significant other(s) on suicide prevention, Complete Psychosocial Assessment, Interpersonal group therapy.  Evaluation of Outcomes: Adequate for Discharge   Progress in Treatment: Attending groups: Yes. and No. Participating in groups: Yes. and No. Taking medication as prescribed: Yes. Toleration medication: Yes. Family/Significant other contact made: Yes, individual(s) contacted:  Completed with the patient brother/legal guardian, Miguel [name] Patient understands diagnosis: No. Discussing patient identified problems/goals with staff: Yes. Medical problems stabilized or resolved: Yes. Denies suicidal/homicidal ideation: Yes. Issues/concerns per  patient self-inventory: No. Other: None   New problem(s) identified: No, Describe:  None Update 07/26/24: No changes at this time Update 08/01/24: No changes at this time  Update 08/06/24: No changes at this time 08/11/24 Update: No changes at this time.  Update 08/16/24: No changes at this time. Update 12/262025:  No changes at this time. Update 08/26/2024:  No changes at this time. Update 08/31/2024:  No changes at this time. Update: 09/05/2024  Update 09/10/2024:  No changes at this time. Update 09/15/2024: No changes at this time. Update 09/21/2024: No changes at this time.  Update 09/27/2024: No changes at this time Update 10/02/2024: No changes at this time   New Short Term/Long Term Goal(s):detox, elimination of symptoms of psychosis, medication management for mood stabilization; elimination of SI thoughts; development of comprehensive mental wellness/sobriety plan.  Update 07/26/24: No changes at this time Update 08/01/24: No changes at this time Update 08/06/24: No changes at this time  10/13/23 Update: No changes at this time. Update 08/16/24: No changes at this time.  Update 12/262025:  No changes at this time. Update 08/26/2024:  No changes at this time. Update 08/31/2024:  No changes at this time. Update:09/05/2024 no changes  Update 09/10/2024:  No changes at this time. Update 09/15/2024: No changes at this time. Update 09/21/2024: No changes at this time.   Update 09/27/2024: No changes at this time Update 10/02/2024: No changes at this time     Patient Goals:  elimination of symptoms of psychosis, medication management for mood stabilization; elimination of SI thoughts; development of comprehensive mental wellness/sobriety plan.  Update 07/26/24: No changes at this time Update 08/01/24: No changes at this time Update 08/06/24: No changes at this time  10/13/23 Update: No changes at this time. Update 08/16/24: No changes at this time.  Update 12/262025:  No changes at this time. Update 08/26/2024:  No  changes at this time. Update 08/31/2024:  No changes at this time. 09/05/2024: no changes Update 09/10/2024:  No changes at this time.  Update 09/15/2024: No changes at this time. Update 09/21/2024: No changes at this time.  Update 09/27/2024: No changes at this time Update 10/02/2024: No changes at this time     Discharge Plan or Barriers: CSW to assist with the development of appropriate discharge plan.  Update 07/26/24: No changes at this time Update 08/01/24: APS Report made for pt, screened it. Update 08/06/24: FL2 sent to [facility]. Pt is not placement. [facility] and APS to assist with placement. 08/11/24 Update: CSW to continue to support [facility] in working toward placement for patient. Update 08/16/24: No changes at this time. Update 12/262025:  No changes at this time. Update 08/26/2024:  No changes at this time. Update 08/31/2024:  Pt awarded interim guardianship at this time, guardianship court date scheduled for 09/07/2024.  Romero at [facility] report no updates on placement at this time. 09/05/2024 no changes  Update 09/10/2024:  No changes at this time.  Update 09/15/2024: Placement continues to be a barrier for this patient. Update 09/21/2024: Pt accepted to [facility], CSW awaiting facility to inform CSW when pt can transition  Update 09/27/2024: No changes at this time Update 10/02/2024: No changes at this time     Reason for Continuation of Hospitalization: Medication Stabilization    Estimated Length of Stay:TBD  Update 09/10/2024:  TBD Update 09/15/2024: No changes at this time. Update 09/21/2024:TBD  Update 09/27/2024: TBD Update 10/02/2024: TBD  Last 3 [place] Suicide Severity Risk Score: Flowsheet Row Admission (Current) from 07/18/2024 in [facility] ED from 07/17/2024 in [facility] Emergency Department at [facility] ED to Hosp-Admission (Discharged) from 02/20/2024 in [facility] ICU/CCU  C-SSRS RISK CATEGORY No Risk  No Risk No Risk    Last PHQ 2/9 Scores:     No data to display          Scribe for Treatment Team: [name], ISRAEL 10/02/2024 11:50 AM

## 2024-10-02 NOTE — BHH Counselor (Signed)
 RNCM & LCSWA Spoke with Tammy at [facility] regarding an update on his bed placement.  Tammy informed that there will be a bed available Tuesday or Wed the latest.  He will need a 30 day supply of medications .  He requires chest xray and TB skin test prior to admission to [facility].

## 2024-10-02 NOTE — Plan of Care (Signed)
" °  Problem: Activity: Goal: Interest or engagement in activities will improve Outcome: Progressing   Problem: Activity: Goal: Interest or engagement in activities will improve Outcome: Not Progressing   Problem: Coping: Goal: Ability to verbalize frustrations and anger appropriately will improve Outcome: Not Progressing   Problem: Education: Goal: Emotional status will improve Outcome: Not Progressing Goal: Mental status will improve Outcome: Not Progressing   "

## 2024-10-02 NOTE — Progress Notes (Signed)
 [facility] MD Progress Note  10/02/2024 1:03 PM Miguel [name]  MRN:  [id number]  Miguel [name] is a 74 year old male with a past psychiatric history significant for schizophrenia who presents under involuntary commitment initiated by family due to medication non-adherence, increasing paranoia, and behavioral disorganization. 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 Brother reports multiple episodes of patient whispering through the night, stating people were talking about him and planning to harm him. Patient was advised by his ACT Team to come to the ED; he refused, prompting the family to pursue IVC.Patient is admitted to [facility] unit with Q15 min safety monitoring. Multidisciplinary team approach is offered. Medication management; group/milieu therapy is offered.   Subjective:  Chart reviewed, case discussed in multidisciplinary meeting, patient seen during rounds.   Patient is noted to be resting in bed.  He denies any complaints.  Per nursing wound care is taking care of the sacral wound as it is not healing.  Patient denies SI/HI/plan and denies hallucinations.  He remains disorganized but calm and cooperative.  He talks about random topics that actually has not occurred on the unit. Past Psychiatric History: see h&P Family History:  Family History  Family history unknown: Yes   Social History:  Social History   Substance and Sexual Activity  Alcohol Use No     Social History   Substance and Sexual Activity  Drug Use No    Social History   Socioeconomic History    Marital status: Divorced    Spouse name: Not on file   Number of children: Not on file   Years of education: Not on file   Highest education level: Not on file  Occupational History   Not on file  Tobacco Use   Smoking status: Every Day    Current packs/day: 0.50    Average packs/day: 0.5 packs/day for 46.1 years (23.0 ttl pk-yrs)    Types: Cigarettes    Start date: 69   Smokeless tobacco: Never  Vaping Use   Vaping status: Never Used  Substance and Sexual Activity   Alcohol use: No   Drug use: No   Sexual activity: Not on file  Other Topics Concern   Not on file  Social History Narrative   Not on file   Social Drivers of Health   Tobacco Use: High Risk (07/18/2024)   Patient History    Smoking Tobacco Use: Every Day    Smokeless Tobacco Use: Never    Passive Exposure: Not on file  Financial Resource Strain: Not on file  Food Insecurity: Patient Unable To Answer (07/18/2024)   Epic    Worried About Programme Researcher, Broadcasting/film/video in the Last Year: Patient unable to answer    Ran Out of Food in the Last Year: Patient unable to answer  Transportation Needs: Unmet Transportation Needs (07/18/2024)   Epic    Lack of Transportation (Medical): Yes    Lack of Transportation (Non-Medical): Yes  Physical Activity: Not on file  Stress: Not on file  Social Connections: Patient Unable To Answer (07/18/2024)   Social Connection and Isolation Panel    Frequency of Communication with Friends and Family: Patient unable to answer  Frequency of Social Gatherings with Friends and Family: Patient unable to answer    Attends Religious Services: Patient unable to answer    Active Member of Clubs or Organizations: Patient unable to answer    Attends Club or Organization Meetings: Patient unable to answer    Marital Status: Patient unable to answer  Depression (PHQ2-9): Not on file  Alcohol Screen: Low Risk (07/18/2024)   Alcohol Screen    Last Alcohol Screening Score (AUDIT): 0  Housing:  Unknown (07/18/2024)   Epic    Unable to Pay for Housing in the Last Year: Patient unable to answer    Number of Times Moved in the Last Year: 0    Homeless in the Last Year: Patient declined  Utilities: Patient Unable To Answer (07/18/2024)   Epic    Threatened with loss of utilities: Patient unable to answer  Health Literacy: Not on file   Past Medical History:  Past Medical History:  Diagnosis Date   Biventricular failure (HCC)    CVA (cerebral vascular accident) (HCC)    Polysubstance use disorder    Schizophrenia (HCC)     Past Surgical History:  Procedure Laterality Date   APPENDECTOMY     TRACHEOSTOMY TUBE PLACEMENT N/A 03/04/2024   Procedure: CREATION, TRACHEOSTOMY;  Surgeon: [name] Massie RAMAN, MD;  Location: [facility] ORS;  Service: ENT;  Laterality: N/A;    Current Medications: Current Facility-Administered Medications  Medication Dose Route Frequency Provider Last Rate Last Admin   acetaminophen  (TYLENOL ) tablet 650 mg  650 mg Oral Q6H PRN Hampton, Tracie B, NP   650 mg at 09/30/24 2232   alum & mag hydroxide-simeth (MAALOX/MYLANTA) 200-200-20 MG/5ML suspension 30 mL  30 mL Oral Q4H PRN Hampton, Tracie B, NP   30 mL at 08/05/24 0306   amiodarone  (PACERONE ) tablet 400 mg  400 mg Oral Daily [name], [name], MD   400 mg at 10/02/24 1013   ARIPiprazole  ER (ABILIFY  MAINTENA) injection 400 mg  400 mg Intramuscular Q28 days Laterrica Libman, MD   400 mg at 09/21/24 0919   benzocaine  (ORAJEL) 10 % mucosal gel   Mouth/Throat TID PRN [name], [name], MD       divalproex  (DEPAKOTE  ER) 24 hr tablet 1,000 mg  1,000 mg Oral QHS Dannia Snook, MD   1,000 mg at 10/01/24 2114   docusate sodium  (COLACE) capsule 100 mg  100 mg Oral Daily Vondra Aldredge, MD   100 mg at 10/02/24 1013   feeding supplement (ENSURE PLUS HIGH PROTEIN) liquid 237 mL  237 mL Oral TID BM [name], [name], MD   237 mL at 10/02/24 1013   hydrOXYzine  (ATARAX ) tablet 25 mg  25 mg Oral TID PRN Hampton, Tracie B, NP   25 mg  at 10/01/24 2114   magnesium  hydroxide (MILK OF MAGNESIA) suspension 30 mL  30 mL Oral Daily PRN Hampton, Tracie B, NP   30 mL at 08/08/24 2233   memantine  (NAMENDA ) tablet 10 mg  10 mg Oral Daily Elisabeth Strom, MD   10 mg at 10/02/24 1013   metoprolol  tartrate (LOPRESSOR ) tablet 12.5 mg  12.5 mg Oral BID [name], [name], MD   12.5 mg at 10/02/24 1013   naproxen  (NAPROSYN ) tablet 250 mg  250 mg Oral BID WC Melvina Pangelinan, MD   250 mg at 10/02/24 0744   OLANZapine  (ZYPREXA ) injection 5 mg  5 mg Intramuscular TID PRN Hampton, Tracie B, NP       OLANZapine  zydis (ZYPREXA ) disintegrating tablet 5  mg  5 mg Oral TID PRN Hampton, Tracie B, NP   5 mg at 08/04/24 1129   oxyCODONE  (Oxy IR/ROXICODONE ) immediate release tablet 5 mg  5 mg Oral Q4H PRN Dorinda Homans T, MD   5 mg at 09/29/24 1110   pantoprazole  (PROTONIX ) EC tablet 40 mg  40 mg Oral Daily [name], [name], MD   40 mg at 10/02/24 1013   sodium chloride  (OCEAN) 0.65 % nasal spray 1 spray  1 spray Each Nare PRN Donnelly Mellow, MD   1 spray at 08/18/24 1459   traZODone  (DESYREL ) tablet 50 mg  50 mg Oral QHS PRN Hampton, Tracie B, NP   50 mg at 10/01/24 2114    Lab Results:  No results found for this or any previous visit (from the past 48 hours).     Blood Alcohol level:  Lab Results  Component Value Date   [facility] <15 07/17/2024   ETH <15 02/20/2024    Metabolic Disorder Labs: Lab Results  Component Value Date   HGBA1C 5.4 08/12/2024   MPG 108.28 08/12/2024   MPG 119.76 07/14/2018   No results found for: PROLACTIN Lab Results  Component Value Date   CHOL 139 08/12/2024   TRIG 73 08/12/2024   HDL 52 08/12/2024   CHOLHDL 2.7 08/12/2024   VLDL 15 08/12/2024   LDLCALC 72 08/12/2024   LDLCALC 119 (H) 07/13/2018    Physical Findings: AIMS:  , ,  ,  ,    CIWA:    COWS:      Psychiatric Specialty Exam:  Presentation  General Appearance: Appropriate for Environment  Eye Contact:Fleeting  Speech:Normal  Rate  Speech Volume:Decreased    Mood and Affect  Mood:Anxious  Affect:Flat   Thought Process  Thought Processes:circumstantial Orientation:Partial  Thought Content:discharge focused  Hallucinations:denies  Ideas of Reference:Delusions At baseline Suicidal Thoughts:denies  Homicidal Thoughts:denies   Sensorium  Memory:Immediate Poor; Recent Poor; Remote Poor  Judgment:Impaired  Insight:None   Executive Functions  Concentration:Poor  Attention Span:Poor  Recall:Poor  Fund of Knowledge:Poor  Language:Fair   Psychomotor Activity  Psychomotor Activity:No data recorded  Musculoskeletal: Strength & Muscle Tone: within normal limits Gait & Station: normal Assets  Assets:Communication Skills; Desire for Improvement; Social Support    Physical Exam: Physical Exam Vitals and nursing note reviewed.    ROS Blood pressure (!) 110/50, pulse 64, temperature 97.9 F (36.6 C), resp. rate 14, height 6' 1 (1.854 m), weight 81.2 kg, SpO2 96%. Body mass index is 23.62 kg/m.  Diagnosis: Active Problems:   Schizophrenia, paranoid (HCC)   Dementia with behavioral disturbance (HCC)   PLAN: Safety and Monitoring:  -- Involuntary admission to inpatient psychiatric unit for safety, stabilization and treatment  -- Daily contact with patient to assess and evaluate symptoms and progress in treatment  -- Patient's case to be discussed in multi-disciplinary team meeting  -- Observation Level : q15 minute checks  -- Vital signs:  q12 hours  -- Precautions: suicide, elopement, and assault -- Encouraged patient to participate in unit milieu and in scheduled group therapies  2. Psychiatric Treatment:  Scheduled Medications:  Discontinued oral Abilify  LAI Abilify  Maintena 400 mg q4 weeks given on 09/21/24  Cogentin  1mg  one dose given for EPS Depakote  ER increased to 1000 mg to help with mood stabilization  Depakote  levels 09/02/24: 57  -- The  risks/benefits/side-effects/alternatives to this medication were discussed in detail with the patient and time was given for questions. The patient consents to medication trial.  3.  Medical Issues Being Addressed:  [facility] consulted for wound care management   Assessment and Plan:   Sacral Decubitus Ulcer Full thickness decubitus ulcer in setting of recent extended hospitalization, discharged from [facility] Wound looks pretty good overall WOC on board we appreciate input   Cognitive Deficit Concern for anoxic brain injury at discharge 02/2024 Delirium precautions   Peripheral Vascular Disease Legs with features c/w peripheral vascular disease, diminished DP pulses Would benefit from outpatient ABI's   HFrEF Euvolemic at this time Continue home medications   Hx Atrial Flutter Not on any anticoagulation at this time EKG with NSR Continue amiodarone   Hypertension Noted, adjust meds gradually after med rec complete   Tooth pain/Infection - Added: Augmentin  along with Orajel BP -better today no longer hypotensive-Will continue reduced dose of metoprolol  12.5 bid.   4. Discharge Planning:   -- Social work and case management to assist with discharge planning and identification of hospital follow-up needs prior to discharge  -- Estimated LOS: 3-4 days  Aaden Buckman, MD 10/02/2024, 1:03 PM

## 2024-10-02 NOTE — Progress Notes (Signed)
 Patient pleasant and cooperative during interaction. Accepted medications without issue. No PRNs given. Wound care completed. Ongoing monitoring continues.   10/02/24 1100  Psych Admission Type (Psych Patients Only)  Admission Status Involuntary  Psychosocial Assessment  Patient Complaints None  Eye Contact Fair  Facial Expression Animated  Affect Labile  Speech Soft  Interaction Assertive  Motor Activity Slow  Appearance/Hygiene Unremarkable  Behavior Characteristics Cooperative  Mood Labile  Thought Process  Coherency Disorganized;Loose associations  Content WDL  Delusions None reported or observed  Perception WDL  Hallucination None reported or observed  Judgment Poor  Confusion Moderate  Danger to Self  Current suicidal ideation? Denies  Danger to Others  Danger to Others None reported or observed

## 2024-10-02 NOTE — Group Note (Signed)
 Date:  10/02/2024 Time:  11:35 AM  Group Topic/Focus:  Making Healthy Choices:   The focus of this group is to help patients identify negative/unhealthy choices they were using prior to admission and identify positive/healthier coping strategies to replace them upon discharge.    Participation Level:  Active  Participation Quality:  Appropriate  Affect:  Appropriate  Cognitive:  Appropriate  Insight: Appropriate  Engagement in Group:  Engaged  Modes of Intervention:  Discussion   Arland Nutting 10/02/2024, 11:35 AM

## 2024-10-02 NOTE — Group Note (Signed)
 Physical/Occupational Therapy Group Note  Group Topic: UE Therex   Group Date: 10/02/2024 Start Time: 1305 End Time: 1335 Facilitators: Clive Warren CROME, OT   Group Description: Group instructed in series of upper extremities exercises, aimed to promote strength, flexibility, range of motion and functional endurance.  Patients provided cuing for proper mechanics and proper pace of exercise; exercises adjusted as necessary for individualized patient needs.  Patient also engaged in cognitive components throughout session, working to integrate attention to task, command following, turn-taking and appropriate social interaction throughout session.  Allowed to ask questions as appropriate, and encouraged to identify specific exercises that they could complete independently outside of group sessions.  Therapeutic Goal(s):  Demonstrate appropriate performance of upper extremity exercises to promote strength, flexibility, range of motion and functional endurance Identify 2-3 specific upper extremity exercises to complete as home exercise program outside of group session  Individual Participation: Pt present for duration of session. Required maximum multimodal cues for attempting and following through with exercises, often appearing asleep or disengaged. Asked questions that were not quite coherent.   Participation Level: Minimal   Participation Quality: Maximum Cues   Behavior: Lethargic   Speech/Thought Process: Loose association    Affect/Mood: Blunted and Flat   Insight: Impaired   Judgement: Impaired   Modes of Intervention: Activity, Clarification, Discussion, Education, Exploration, Problem-solving, Rapport Building, Socialization, and Support  Patient Response to Interventions:  Disengaged   Plan: Continue to engage patient in PT/OT groups 1 - 2x/week.  Wyman Meschke R., MPH, MS, OTR/L ascom [phone] 10/02/24, 2:24 PM

## 2024-10-02 NOTE — Group Note (Signed)
 Recreation Therapy Group Note   Group Topic:Emotion Expression  Group Date: 10/02/2024 Start Time: 1400 End Time: 1450 Facilitators: Celestia Jeoffrey BRAVO, LRT, CTRS Location: Dayroom  Group Description: Patients will engage in a guided art activity focused on painting a personal peaceful place. This group encourages relaxation, self-expression, and mindfulness through creative exploration. Participants are invited to imagine a location where they feel calm, safe, or content and represent it using paint and other art materials. Emphasis is placed on the process rather than artistic skill, allowing individuals to express emotions, reduce stress, and increase self-awareness in a supportive group setting.  Goal Area(s): Promote relaxation and stress reduction Enhance emotional expression and self-awareness Support coping skills and grounding techniques Encourage creativity and positive leisure engagement [place] interaction and group participation   Affect/Mood: N/A   Participation Level: Did not attend    Clinical Observations/Individualized Feedback: Patient did not attend.  Plan: Continue to engage patient in RT group sessions 2-3x/week.   Jeoffrey BRAVO Celestia, LRT, CTRS 10/02/2024 4:40 PM

## 2024-10-02 NOTE — Progress Notes (Signed)
 For discharge placement to facility: 0.1 mL tuberculin solution adm intradermally to left forearm. Patient tolerated well. Instructed patient to not scratch the area. Verbalized understanding.
